# Patient Record
Sex: Female | Born: 1940
Health system: Southern US, Community
[De-identification: ages and names within clinical notes are randomized; demographics above are authoritative.]

## PROBLEM LIST (undated history)

## (undated) DIAGNOSIS — N2581 Secondary hyperparathyroidism of renal origin: Secondary | ICD-10-CM

## (undated) DIAGNOSIS — IMO0002 Reserved for concepts with insufficient information to code with codable children: Secondary | ICD-10-CM

## (undated) DIAGNOSIS — I2510 Atherosclerotic heart disease of native coronary artery without angina pectoris: Secondary | ICD-10-CM

## (undated) DIAGNOSIS — E1140 Type 2 diabetes mellitus with diabetic neuropathy, unspecified: Secondary | ICD-10-CM

## (undated) DIAGNOSIS — G4730 Sleep apnea, unspecified: Secondary | ICD-10-CM

## (undated) DIAGNOSIS — M8580 Other specified disorders of bone density and structure, unspecified site: Secondary | ICD-10-CM

## (undated) DIAGNOSIS — K635 Polyp of colon: Secondary | ICD-10-CM

## (undated) DIAGNOSIS — E079 Disorder of thyroid, unspecified: Secondary | ICD-10-CM

## (undated) DIAGNOSIS — J189 Pneumonia, unspecified organism: Secondary | ICD-10-CM

## (undated) DIAGNOSIS — N183 Chronic kidney disease, stage 3 unspecified: Secondary | ICD-10-CM

## (undated) DIAGNOSIS — E1165 Type 2 diabetes mellitus with hyperglycemia: Secondary | ICD-10-CM

## (undated) DIAGNOSIS — G44009 Cluster headache syndrome, unspecified, not intractable: Secondary | ICD-10-CM

## (undated) DIAGNOSIS — E785 Hyperlipidemia, unspecified: Secondary | ICD-10-CM

## (undated) DIAGNOSIS — M1990 Unspecified osteoarthritis, unspecified site: Secondary | ICD-10-CM

## (undated) DIAGNOSIS — T4145XA Adverse effect of unspecified anesthetic, initial encounter: Secondary | ICD-10-CM

## (undated) DIAGNOSIS — J45909 Unspecified asthma, uncomplicated: Secondary | ICD-10-CM

## (undated) DIAGNOSIS — H903 Sensorineural hearing loss, bilateral: Secondary | ICD-10-CM

## (undated) DIAGNOSIS — T8859XA Other complications of anesthesia, initial encounter: Secondary | ICD-10-CM

## (undated) DIAGNOSIS — C50912 Malignant neoplasm of unspecified site of left female breast: Secondary | ICD-10-CM

## (undated) DIAGNOSIS — K219 Gastro-esophageal reflux disease without esophagitis: Secondary | ICD-10-CM

## (undated) DIAGNOSIS — I10 Essential (primary) hypertension: Secondary | ICD-10-CM

## (undated) DIAGNOSIS — E039 Hypothyroidism, unspecified: Secondary | ICD-10-CM

## (undated) HISTORY — DX: Other specified disorders of bone density and structure, unspecified site: M85.80

## (undated) HISTORY — DX: Essential (primary) hypertension: I10

## (undated) HISTORY — DX: Chronic kidney disease, stage 3 unspecified: N18.30

## (undated) HISTORY — DX: Disorder of thyroid, unspecified: E07.9

## (undated) HISTORY — DX: Sensorineural hearing loss, bilateral: H90.3

## (undated) HISTORY — DX: Unspecified osteoarthritis, unspecified site: M19.90

## (undated) HISTORY — DX: Atherosclerotic heart disease of native coronary artery without angina pectoris: I25.10

## (undated) HISTORY — PX: BREAST BIOPSY: SHX20

## (undated) HISTORY — DX: Hyperlipidemia, unspecified: E78.5

## (undated) HISTORY — DX: Sleep apnea, unspecified: G47.30

## (undated) HISTORY — DX: Polyp of colon: K63.5

## (undated) HISTORY — DX: Chronic kidney disease, stage 3 (moderate): N18.3

## (undated) HISTORY — DX: Cluster headache syndrome, unspecified, not intractable: G44.009

## (undated) HISTORY — DX: Type 2 diabetes mellitus with diabetic neuropathy, unspecified: E11.40

## (undated) HISTORY — PX: GALLBLADDER SURGERY: SHX652

## (undated) HISTORY — DX: Gastro-esophageal reflux disease without esophagitis: K21.9

## (undated) HISTORY — PX: ABDOMINAL HYSTERECTOMY: SHX81

## (undated) HISTORY — DX: Secondary hyperparathyroidism of renal origin: N25.81

## (undated) HISTORY — DX: Type 2 diabetes mellitus with hyperglycemia: E11.65

## (undated) HISTORY — PX: CHOLECYSTECTOMY: SHX55

## (undated) HISTORY — DX: Reserved for concepts with insufficient information to code with codable children: IMO0002

## (undated) HISTORY — DX: Unspecified asthma, uncomplicated: J45.909

## (undated) HISTORY — PX: APPENDECTOMY: SHX54

## (undated) HISTORY — DX: Malignant neoplasm of unspecified site of left female breast: C50.912

---

## 2003-02-13 LAB — HM PAP SMEAR

## 2004-06-30 HISTORY — PX: CARDIAC CATHETERIZATION: SHX172

## 2008-08-05 ENCOUNTER — Emergency Department: Payer: Self-pay | Admitting: Emergency Medicine

## 2009-02-12 LAB — HM COLONOSCOPY

## 2010-02-12 LAB — HM DEXA SCAN

## 2010-05-30 HISTORY — PX: COLONOSCOPY: SHX174

## 2011-07-03 DIAGNOSIS — D492 Neoplasm of unspecified behavior of bone, soft tissue, and skin: Secondary | ICD-10-CM | POA: Diagnosis not present

## 2011-09-12 DIAGNOSIS — R5381 Other malaise: Secondary | ICD-10-CM | POA: Diagnosis not present

## 2011-09-12 DIAGNOSIS — J020 Streptococcal pharyngitis: Secondary | ICD-10-CM | POA: Diagnosis not present

## 2011-09-12 DIAGNOSIS — R509 Fever, unspecified: Secondary | ICD-10-CM | POA: Diagnosis not present

## 2011-09-12 DIAGNOSIS — J029 Acute pharyngitis, unspecified: Secondary | ICD-10-CM | POA: Diagnosis not present

## 2011-09-12 DIAGNOSIS — R5383 Other fatigue: Secondary | ICD-10-CM | POA: Diagnosis not present

## 2011-09-19 DIAGNOSIS — M999 Biomechanical lesion, unspecified: Secondary | ICD-10-CM | POA: Diagnosis not present

## 2011-09-19 DIAGNOSIS — M546 Pain in thoracic spine: Secondary | ICD-10-CM | POA: Diagnosis not present

## 2011-10-06 DIAGNOSIS — M999 Biomechanical lesion, unspecified: Secondary | ICD-10-CM | POA: Diagnosis not present

## 2011-10-06 DIAGNOSIS — M546 Pain in thoracic spine: Secondary | ICD-10-CM | POA: Diagnosis not present

## 2012-01-14 ENCOUNTER — Ambulatory Visit: Payer: Self-pay | Admitting: Internal Medicine

## 2012-01-14 DIAGNOSIS — L821 Other seborrheic keratosis: Secondary | ICD-10-CM | POA: Diagnosis not present

## 2012-01-14 DIAGNOSIS — L738 Other specified follicular disorders: Secondary | ICD-10-CM | POA: Diagnosis not present

## 2012-01-14 DIAGNOSIS — D239 Other benign neoplasm of skin, unspecified: Secondary | ICD-10-CM | POA: Diagnosis not present

## 2012-02-13 ENCOUNTER — Encounter: Payer: Self-pay | Admitting: Internal Medicine

## 2012-02-13 ENCOUNTER — Ambulatory Visit (INDEPENDENT_AMBULATORY_CARE_PROVIDER_SITE_OTHER): Payer: Medicare Other | Admitting: Internal Medicine

## 2012-02-13 VITALS — BP 170/68 | HR 78 | Temp 98.0°F | Resp 16 | Ht 65.5 in | Wt 180.5 lb

## 2012-02-13 DIAGNOSIS — R5381 Other malaise: Secondary | ICD-10-CM

## 2012-02-13 DIAGNOSIS — G4733 Obstructive sleep apnea (adult) (pediatric): Secondary | ICD-10-CM

## 2012-02-13 DIAGNOSIS — E1142 Type 2 diabetes mellitus with diabetic polyneuropathy: Secondary | ICD-10-CM

## 2012-02-13 DIAGNOSIS — Z9989 Dependence on other enabling machines and devices: Secondary | ICD-10-CM

## 2012-02-13 DIAGNOSIS — E559 Vitamin D deficiency, unspecified: Secondary | ICD-10-CM

## 2012-02-13 DIAGNOSIS — E1149 Type 2 diabetes mellitus with other diabetic neurological complication: Secondary | ICD-10-CM | POA: Diagnosis not present

## 2012-02-13 DIAGNOSIS — R5383 Other fatigue: Secondary | ICD-10-CM

## 2012-02-13 DIAGNOSIS — E1140 Type 2 diabetes mellitus with diabetic neuropathy, unspecified: Secondary | ICD-10-CM

## 2012-02-13 DIAGNOSIS — I2510 Atherosclerotic heart disease of native coronary artery without angina pectoris: Secondary | ICD-10-CM | POA: Diagnosis not present
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
## 2012-02-13 NOTE — Patient Instructions (Addendum)
You need 1200 mg calcium daily either through diet or combination with supplements.    You need 800 units vitamin D.  We will check your level with next blood draw.   Please bring your log of old blood sugars next week to drop off.  Stop the cremora, and use half n half, it is better for you  We will get your mammogram set up in the next couple of weeks.    Consider a Low Glycemic Index Diet and eating 6 smaller meals daily .  This frequent feeding stimulates your metabolism and the lower glycemic index foods will lower your blood sugars:   This is an example of my daily  "Low GI"  Diet:  All of the foods can be found at grocery stores and in bulk at [employer]  club   7 AM Breakfast:  Low carbohydrate Protein  Shakes (I recommend the [employer]vant[employer] "Carb Control" shakes  Or the low carb shakes by Atkins.   Both are available everywhere:  In  cases at [employer]  Or in 4 packs at grocery stores and pharmacies  2.5 carbs  (Alternative is  a toasted Arnold's Sandwhich Thin w/ peanut butter, a "Bagel Thin" with cream cheese and salmon) or  a scrambled egg burrito made with a low carb tortilla .  Avoid cereal and bananas, oatmeal too unless the old fashioned kind that takes 30-40 minutes to prepare.  the rest is overly processed, has minimal fiber, and loaded with carbohydrates!   10 AM: Protein bar by Atkins (the snack size, under 200 cal.  There are many varieties , available widely again or in bulk in limited varieties at [employer])  Other so called "protein bars" tend to be loaded with carbohydrates.  Remember, in food advertising, the word "energy" is synonymous for " carbohydrate."  Lunch: sandwich of [place], (or any lunchmeat or canned tuna), fresh avocado and cheese on a lower carbohydrate pita bread, flatbread, or tortilla . Ok to use mayonnaise. The bread is the only source or carbohydrate that can be decreased (Joseph's makes a pita bread and a flat bread  Are 50 cal and 4 net carbs ; Toufayan makes a low  carb flatbread 100 cal and 9 net carbs  and  Mission makes a low carb whole wheat tortilla  210 cal and 6 net carbs)  3 PM:  Mid day :  Another proteintttt bThe brear,  Or a  cheese stick (100 cal, 0 carbs),  Or 1 ounce of  almonds, walnuts, pistachios, pecans, peanuts,  Macadamia nuts. Or a Dannon light n Fit greek yogurt, 80 cal 8 net carbs . Avoid "granola"; the dried cranberries and raisins are loaded with carbohydrates.    6 PM  Dinner:  "mean and green:"  Meat/chicken/fish or a high protein legume; , with a green salad, and a low GI  Veggie (broccoli, cauliflower, green beans, spinach, brussel sprouts. Lima beans) : Avoid "Low fat dressings, Reyne Dumas and [address]! They are loaded with sugar! Instead use ranch, vinagrette,  Blue cheese, etc  9 PM snack : Breyer's "low carb" fudgsicle or  ice cream bar (Carb Smart line), or  Weight Watcher's ice cream bar , or anouther "no sugar added" ice cream; or another protein shake or a serving of fresh fruit with whipped cream (Avoid bananas, pineapple, grapes  and watermelon on a regular basis because they are high in sugar)   Remember that snack Substitutions should be less than 15 to 20 carbs  Per serving. Remember to subtract fiber grams to get the "net carbs."

## 2012-02-15 DIAGNOSIS — E1140 Type 2 diabetes mellitus with diabetic neuropathy, unspecified: Secondary | ICD-10-CM | POA: Insufficient documentation

## 2012-02-15 DIAGNOSIS — E118 Type 2 diabetes mellitus with unspecified complications: Secondary | ICD-10-CM | POA: Insufficient documentation

## 2012-02-15 NOTE — Assessment & Plan Note (Addendum)
We spent 15 minutes reviewing the role of diet in management of diabetes. We discussed the low glycemic index diet. I explained to her and husband had to count carbohydrates and give her the [facility] glycemic index list of foods. I've also given her a copy of the low glycemic index diet and suggested that she try to follow this closely as possible. I warned her that doing this may require reduction in her insulin dosing. She's also taking her insulin correctly. Her is 60 7030 insulin is to be taken before dinner or before lunch. I'm not sure why she is on 3 different injectable medications; is an odd regimen. She will return in one month to discuss blood sugars again. Hemoglobin A1c has been ordered.

## 2012-02-15 NOTE — Assessment & Plan Note (Signed)
She is in need of supplies for her current keep CPAP machine and is not using it because it is leaking. Her last sleep study was over 4 years ago I scheduled her for a new sleep study so that she may at some new equipment as well.

## 2012-02-15 NOTE — Assessment & Plan Note (Addendum)
With 2 prior cardiac catheterizations done because of chest pain.  Nonocclusive disease,no history of stents.  First cardiac cath by Deberah Castle in [place], 2nd one was a few years ago with no changes reported.  Last saw him Dec 2012.  Records requested. Continue beta blocker.

## 2012-02-18 ENCOUNTER — Other Ambulatory Visit: Payer: Medicare Other

## 2012-02-19 ENCOUNTER — Other Ambulatory Visit: Payer: Medicare Other

## 2012-02-20 ENCOUNTER — Other Ambulatory Visit: Payer: Medicare Other

## 2012-02-25 ENCOUNTER — Other Ambulatory Visit (INDEPENDENT_AMBULATORY_CARE_PROVIDER_SITE_OTHER): Payer: Medicare Other | Admitting: *Deleted

## 2012-02-25 DIAGNOSIS — E1140 Type 2 diabetes mellitus with diabetic neuropathy, unspecified: Secondary | ICD-10-CM

## 2012-02-25 DIAGNOSIS — E559 Vitamin D deficiency, unspecified: Secondary | ICD-10-CM

## 2012-02-25 DIAGNOSIS — R5381 Other malaise: Secondary | ICD-10-CM | POA: Diagnosis not present

## 2012-02-25 DIAGNOSIS — R5383 Other fatigue: Secondary | ICD-10-CM

## 2012-02-25 DIAGNOSIS — E1149 Type 2 diabetes mellitus with other diabetic neurological complication: Secondary | ICD-10-CM

## 2012-02-25 DIAGNOSIS — E1142 Type 2 diabetes mellitus with diabetic polyneuropathy: Secondary | ICD-10-CM

## 2012-02-25 LAB — COMPREHENSIVE METABOLIC PANEL
ALT: 17 U/L (ref 0–35)
AST: 18 U/L (ref 0–37)
BUN: 10 mg/dL (ref 6–23)
Creatinine, Ser: 1.1 mg/dL (ref 0.4–1.2)
GFR: 54.63 mL/min — ABNORMAL LOW (ref >60.00)
Total Bilirubin: 0.9 mg/dL (ref 0.3–1.2)

## 2012-02-25 LAB — LIPID PANEL
HDL: 39.7 mg/dL (ref >39.00)
Total CHOL/HDL Ratio: 4
Triglycerides: 187 mg/dL — ABNORMAL HIGH (ref 0.0–149.0)
VLDL: 37.4 mg/dL (ref 0.0–40.0)

## 2012-02-25 LAB — MICROALBUMIN / CREATININE URINE RATIO: Microalb, Ur: 3.7 mg/dL — ABNORMAL HIGH (ref 0.0–1.9)

## 2012-02-25 LAB — TSH: TSH: 4.01 u[IU]/mL (ref 0.35–5.50)

## 2012-02-26 ENCOUNTER — Telehealth: Payer: Self-pay | Admitting: Internal Medicine

## 2012-02-26 LAB — VITAMIN D 25 HYDROXY (VIT D DEFICIENCY, FRACTURES): Vit D, 25-Hydroxy: 28 ng/mL — ABNORMAL LOW (ref 30–89)

## 2012-02-26 NOTE — Telephone Encounter (Signed)
I have reviewed her labs. Unfortunately the hemoglobin A1c was not ordered or resulted. If they can add this to the labs that were done,  [place];  if not I would like her to come back at her convenience to have it drawn . Judging by the blood sugars that she dropped off her hemoglobin A1c is probably around 11. I'm recommending that she stop the Lantus and start taking 2 shots of  70/30 insulin  daily. One will be before breakfast and  the other before dinner.. I would like her to increase her evening dose of 70/30 from 1030 units aunits currently to start a morning dose of 20 units before breakfast. I would like her to resubmit blood sugars in 2 weeks. If she is having any lows as a result of this change she should call immediately for further instructions.  Also her vitamin D level is a little bit low. If she's not taking any supplements currently for vitamin D she should start taking 1000 units daily.  this can be obtained over-the-counter.

## 2012-02-27 NOTE — Telephone Encounter (Signed)
Patient notified. She will call back next week to schedule lab visit for a1c.    ( Spoke with Dr. Darrick Huntsman, I was unclear of insulin dose- she is to take 30 units in PM and 20 units in am.)

## 2012-03-09 ENCOUNTER — Other Ambulatory Visit (INDEPENDENT_AMBULATORY_CARE_PROVIDER_SITE_OTHER): Payer: Medicare Other

## 2012-03-09 DIAGNOSIS — E119 Type 2 diabetes mellitus without complications: Secondary | ICD-10-CM | POA: Diagnosis not present

## 2012-03-10 ENCOUNTER — Telehealth: Payer: Self-pay | Admitting: Internal Medicine

## 2012-03-11 ENCOUNTER — Other Ambulatory Visit: Payer: Self-pay | Admitting: Internal Medicine

## 2012-03-11 MED ORDER — INSULIN ASPART PROT & ASPART (70-30 MIX) 100 UNIT/ML ~~LOC~~ SUSP: 10.0000 [IU] | Freq: Every day | SUBCUTANEOUS | Status: DC

## 2012-03-11 MED ORDER — FUROSEMIDE 20 MG PO TABS: 10.0000 mg | ORAL_TABLET | Freq: Every day | ORAL | Status: DC

## 2012-03-11 MED ORDER — ATORVASTATIN CALCIUM 40 MG PO TABS: 40.0000 mg | ORAL_TABLET | Freq: Every day | ORAL | Status: DC

## 2012-03-11 MED ORDER — POTASSIUM CHLORIDE CRYS ER 20 MEQ PO TBCR: 20.0000 meq | EXTENDED_RELEASE_TABLET | Freq: Every day | ORAL | Status: DC

## 2012-03-11 MED ORDER — LIRAGLUTIDE 18 MG/3ML ~~LOC~~ SOLN: 30.0000 mg | Freq: Every day | SUBCUTANEOUS | Status: DC

## 2012-03-11 MED ORDER — METOPROLOL SUCCINATE ER 100 MG PO TB24: 100.0000 mg | ORAL_TABLET | Freq: Every day | ORAL | Status: DC

## 2012-03-11 MED ORDER — INSULIN GLARGINE 100 UNIT/ML ~~LOC~~ SOLN: 50.0000 [IU] | Freq: Every day | SUBCUTANEOUS | Status: DC

## 2012-03-11 MED ORDER — AMLODIPINE BESYLATE 5 MG PO TABS: 5.0000 mg | ORAL_TABLET | Freq: Every day | ORAL | Status: DC

## 2012-06-02 DIAGNOSIS — M9981 Other biomechanical lesions of cervical region: Secondary | ICD-10-CM | POA: Diagnosis not present

## 2012-06-02 DIAGNOSIS — M5137 Other intervertebral disc degeneration, lumbosacral region: Secondary | ICD-10-CM | POA: Diagnosis not present

## 2012-06-02 DIAGNOSIS — IMO0002 Reserved for concepts with insufficient information to code with codable children: Secondary | ICD-10-CM | POA: Diagnosis not present

## 2012-06-02 DIAGNOSIS — M5030 Other cervical disc degeneration, unspecified cervical region: Secondary | ICD-10-CM | POA: Diagnosis not present

## 2012-06-03 DIAGNOSIS — M5030 Other cervical disc degeneration, unspecified cervical region: Secondary | ICD-10-CM | POA: Diagnosis not present

## 2012-06-03 DIAGNOSIS — M5137 Other intervertebral disc degeneration, lumbosacral region: Secondary | ICD-10-CM | POA: Diagnosis not present

## 2012-06-03 DIAGNOSIS — M9981 Other biomechanical lesions of cervical region: Secondary | ICD-10-CM | POA: Diagnosis not present

## 2012-06-03 DIAGNOSIS — IMO0002 Reserved for concepts with insufficient information to code with codable children: Secondary | ICD-10-CM | POA: Diagnosis not present

## 2012-06-08 DIAGNOSIS — E119 Type 2 diabetes mellitus without complications: Secondary | ICD-10-CM | POA: Diagnosis not present

## 2012-06-08 DIAGNOSIS — M5030 Other cervical disc degeneration, unspecified cervical region: Secondary | ICD-10-CM | POA: Diagnosis not present

## 2012-06-08 DIAGNOSIS — M9981 Other biomechanical lesions of cervical region: Secondary | ICD-10-CM | POA: Diagnosis not present

## 2012-06-08 DIAGNOSIS — IMO0002 Reserved for concepts with insufficient information to code with codable children: Secondary | ICD-10-CM | POA: Diagnosis not present

## 2012-06-08 DIAGNOSIS — M5137 Other intervertebral disc degeneration, lumbosacral region: Secondary | ICD-10-CM | POA: Diagnosis not present

## 2012-06-10 DIAGNOSIS — IMO0002 Reserved for concepts with insufficient information to code with codable children: Secondary | ICD-10-CM | POA: Diagnosis not present

## 2012-06-10 DIAGNOSIS — M5030 Other cervical disc degeneration, unspecified cervical region: Secondary | ICD-10-CM | POA: Diagnosis not present

## 2012-06-10 DIAGNOSIS — M5137 Other intervertebral disc degeneration, lumbosacral region: Secondary | ICD-10-CM | POA: Diagnosis not present

## 2012-06-10 DIAGNOSIS — M9981 Other biomechanical lesions of cervical region: Secondary | ICD-10-CM | POA: Diagnosis not present

## 2012-06-14 DIAGNOSIS — M9981 Other biomechanical lesions of cervical region: Secondary | ICD-10-CM | POA: Diagnosis not present

## 2012-06-14 DIAGNOSIS — M5137 Other intervertebral disc degeneration, lumbosacral region: Secondary | ICD-10-CM | POA: Diagnosis not present

## 2012-06-14 DIAGNOSIS — M5030 Other cervical disc degeneration, unspecified cervical region: Secondary | ICD-10-CM | POA: Diagnosis not present

## 2012-06-14 DIAGNOSIS — IMO0002 Reserved for concepts with insufficient information to code with codable children: Secondary | ICD-10-CM | POA: Diagnosis not present

## 2012-06-15 ENCOUNTER — Ambulatory Visit (INDEPENDENT_AMBULATORY_CARE_PROVIDER_SITE_OTHER): Payer: Medicare Other | Admitting: Endocrinology

## 2012-06-15 ENCOUNTER — Encounter: Payer: Self-pay | Admitting: Endocrinology

## 2012-06-15 VITALS — BP 128/78 | HR 76 | Temp 97.8°F | Wt 184.0 lb

## 2012-06-15 DIAGNOSIS — E1165 Type 2 diabetes mellitus with hyperglycemia: Secondary | ICD-10-CM

## 2012-06-15 DIAGNOSIS — IMO0001 Reserved for inherently not codable concepts without codable children: Secondary | ICD-10-CM | POA: Diagnosis not present

## 2012-06-15 DIAGNOSIS — IMO0002 Reserved for concepts with insufficient information to code with codable children: Secondary | ICD-10-CM

## 2012-06-15 MED ORDER — INSULIN ASPART 100 UNIT/ML ~~LOC~~ SOLN: SUBCUTANEOUS | Status: DC

## 2012-06-15 NOTE — Progress Notes (Signed)
Subjective:    Patient ID: Julia Banks, female    DOB: 25-Feb-1941, 71 y.o.   MRN: [id number]  HPI pt states 20 years h/o dm, complicated by CAD.  he has been on insulin x approx 12 years.  pt says her diet and exercise are good.  She says cbg's are 170-400. It is in general higher as the day goes on.   Pt states of moderate pain at both legs, worst in the context of resting in bed, but no assoc numbness. No past medical history on file.  No past surgical history on file.  History   Social History  . Marital Status: Married    Spouse Name: N/A    Number of Children: N/A  . Years of Education: N/A   Occupational History  . Not on file.   Social History Main Topics  . Smoking status: Never Smoker   . Smokeless tobacco: Never Used  . Alcohol Use: No  . Drug Use: No  . Sexually Active: Not on file   Other Topics Concern  . Not on file   Social History Narrative  . No narrative on file    Current Outpatient Prescriptions on File Prior to Visit  Medication Sig Dispense Refill  . amLODipine (NORVASC) 5 MG tablet Take 1 tablet (5 mg total) by mouth daily.  30 tablet  3  . atorvastatin (LIPITOR) 40 MG tablet Take 1 tablet (40 mg total) by mouth daily.  30 tablet  3  . furosemide (LASIX) 20 MG tablet Take 0.5 tablets (10 mg total) by mouth daily.  30 tablet  3  . insulin aspart protamine-insulin aspart (NOVOLOG 70/30) (70-30) 100 UNIT/ML injection Inject 10 Units into the skin daily with supper.  10 mL  3  . insulin glargine (LANTUS) 100 UNIT/ML injection Inject 50 Units into the skin at bedtime.  10 mL  3  . metoprolol succinate (TOPROL-XL) 100 MG 24 hr tablet Take 1 tablet (100 mg total) by mouth daily. Take with or immediately following a meal.  30 tablet  3  . potassium chloride SA (K-DUR,KLOR-CON) 20 MEQ tablet Take 1 tablet (20 mEq total) by mouth daily.  30 tablet  3  . Liraglutide (VICTOZA) 18 MG/3ML SOLN Inject 5 mLs (30 mg total) into the skin daily.  6 mL  3    Allergies  Allergen Reactions  . Altace (Ramipril)   . Aspirin    No family history on file. DM: mother and 1 sib BP 128/78  Pulse 76  Temp 97.8 F (36.6 C) (Oral)  Wt 184 lb (83.462 kg)  SpO2 95%  Review of Systems denies blurry vision, headache, chest pain, sob, cramps, excessive diaphoresis, memory loss, depression, hypoglycemia, rhinorrhea, and easy bruising.  She has lost 30 lbs, due to her efforts.  She reports urinary frequency and easy bruising.  She says victoza causes nausea.    Objective:   Physical Exam VS: see vs page GEN: no distress HEAD: head: no deformity eyes: no periorbital swelling, no proptosis external nose and ears are normal mouth: no lesion seen NECK: supple, thyroid is not enlarged CHEST WALL: no deformity LUNGS:  Clear to auscultation CV: reg rate and rhythm, no murmur ABD: abdomen is soft, nontender.  no hepatosplenomegaly.  not distended.  no hernia MUSCULOSKELETAL: muscle bulk and strength are grossly normal.  no obvious joint swelling.  gait is normal and steady EXTEMITIES: no deformity.  no ulcer on the feet.  feet are of normal color and  temp.  no edema PULSES: dorsalis pedis intact bilat.  no carotid bruit NEURO:  cn 2-12 grossly intact.   readily moves all 4's.  sensation is intact to touch on the feet SKIN:  Normal texture and temperature.  No rash or suspicious lesion is visible.   NODES:  None palpable at the neck PSYCH: alert, oriented x3.  Does not appear anxious nor depressed.  Lab Results  Component Value Date   HGBA1C 11.7* 03/09/2012      Assessment & Plan:  DM, needs increased rx.  i agree with dr Darrick Huntsman this this causes high risk to her health Leg pain, ? Neuropathic Weight loss, prob due to severe hyperglycemia Nausea due to victoza

## 2012-06-15 NOTE — Patient Instructions (Addendum)
good diet and exercise habits significanly improve the control of your diabetes.  please let me know if you wish to be referred to a dietician.  high blood sugar is very risky to your health.  you should see an eye doctor every year.  You are at higher than average risk for pneumonia and hepatitis-B.  You should be vaccinated against both.   controlling your blood pressure and cholesterol drastically reduces the damage diabetes does to your body.  this also applies to quitting smoking.  please discuss these with your doctor.  you should take an aspirin every day, unless you have been advised by a doctor not to. check your blood sugar twice a day.  vary the time of day when you check, between before the 3 meals, and at bedtime.  also check if you have symptoms of your blood sugar being too high or too low.  please keep a record of the readings and bring it to your next appointment here.  please call us sooner if your blood sugar goes below 70, or if you have a lot of readings over 200.   For now: Please continue the same lantus, and: Increase the novolog to 3 times a day (just before each meal) 04-09-19 units. Please come back for a follow-up appointment in 3-4 weeks.   You can stop taking the victoza.

## 2012-06-16 DIAGNOSIS — IMO0002 Reserved for concepts with insufficient information to code with codable children: Secondary | ICD-10-CM | POA: Diagnosis not present

## 2012-06-16 DIAGNOSIS — M5030 Other cervical disc degeneration, unspecified cervical region: Secondary | ICD-10-CM | POA: Diagnosis not present

## 2012-06-16 DIAGNOSIS — M9981 Other biomechanical lesions of cervical region: Secondary | ICD-10-CM | POA: Diagnosis not present

## 2012-06-16 DIAGNOSIS — M5137 Other intervertebral disc degeneration, lumbosacral region: Secondary | ICD-10-CM | POA: Diagnosis not present

## 2012-06-18 LAB — HM DIABETES EYE EXAM: HM Diabetic Eye Exam: NORMAL

## 2012-06-21 DIAGNOSIS — L851 Acquired keratosis [keratoderma] palmaris et plantaris: Secondary | ICD-10-CM | POA: Diagnosis not present

## 2012-06-21 DIAGNOSIS — E1149 Type 2 diabetes mellitus with other diabetic neurological complication: Secondary | ICD-10-CM | POA: Diagnosis not present

## 2012-06-21 DIAGNOSIS — B351 Tinea unguium: Secondary | ICD-10-CM | POA: Diagnosis not present

## 2012-06-21 DIAGNOSIS — E1142 Type 2 diabetes mellitus with diabetic polyneuropathy: Secondary | ICD-10-CM | POA: Diagnosis not present

## 2012-06-25 ENCOUNTER — Other Ambulatory Visit: Payer: Self-pay | Admitting: Internal Medicine

## 2012-06-27 ENCOUNTER — Ambulatory Visit: Payer: Self-pay | Admitting: Internal Medicine

## 2012-06-27 DIAGNOSIS — G2589 Other specified extrapyramidal and movement disorders: Secondary | ICD-10-CM | POA: Diagnosis not present

## 2012-06-27 DIAGNOSIS — G2581 Restless legs syndrome: Secondary | ICD-10-CM | POA: Diagnosis not present

## 2012-06-27 DIAGNOSIS — R0609 Other forms of dyspnea: Secondary | ICD-10-CM | POA: Diagnosis not present

## 2012-06-27 DIAGNOSIS — R0989 Other specified symptoms and signs involving the circulatory and respiratory systems: Secondary | ICD-10-CM | POA: Diagnosis not present

## 2012-06-27 DIAGNOSIS — G4733 Obstructive sleep apnea (adult) (pediatric): Secondary | ICD-10-CM | POA: Diagnosis not present

## 2012-06-29 ENCOUNTER — Other Ambulatory Visit: Payer: Self-pay | Admitting: Internal Medicine

## 2012-06-29 DIAGNOSIS — IMO0002 Reserved for concepts with insufficient information to code with codable children: Secondary | ICD-10-CM | POA: Diagnosis not present

## 2012-06-29 DIAGNOSIS — M5137 Other intervertebral disc degeneration, lumbosacral region: Secondary | ICD-10-CM | POA: Diagnosis not present

## 2012-06-29 DIAGNOSIS — M5030 Other cervical disc degeneration, unspecified cervical region: Secondary | ICD-10-CM | POA: Diagnosis not present

## 2012-06-29 DIAGNOSIS — M9981 Other biomechanical lesions of cervical region: Secondary | ICD-10-CM | POA: Diagnosis not present

## 2012-07-01 ENCOUNTER — Telehealth: Payer: Self-pay | Admitting: Internal Medicine

## 2012-07-01 ENCOUNTER — Other Ambulatory Visit: Payer: Self-pay | Admitting: *Deleted

## 2012-07-01 DIAGNOSIS — M5030 Other cervical disc degeneration, unspecified cervical region: Secondary | ICD-10-CM | POA: Diagnosis not present

## 2012-07-01 DIAGNOSIS — IMO0002 Reserved for concepts with insufficient information to code with codable children: Secondary | ICD-10-CM | POA: Diagnosis not present

## 2012-07-01 DIAGNOSIS — M5137 Other intervertebral disc degeneration, lumbosacral region: Secondary | ICD-10-CM | POA: Diagnosis not present

## 2012-07-01 DIAGNOSIS — Z9989 Dependence on other enabling machines and devices: Secondary | ICD-10-CM

## 2012-07-01 DIAGNOSIS — M9981 Other biomechanical lesions of cervical region: Secondary | ICD-10-CM | POA: Diagnosis not present

## 2012-07-01 MED ORDER — ZOLPIDEM TARTRATE 5 MG PO TABS: 5.0000 mg | ORAL_TABLET | Freq: Every evening | ORAL | Status: DC | PRN

## 2012-07-01 MED ORDER — ATORVASTATIN CALCIUM 40 MG PO TABS: 40.0000 mg | ORAL_TABLET | Freq: Every day | ORAL | Status: DC

## 2012-07-01 MED ORDER — INSULIN GLARGINE 100 UNIT/ML ~~LOC~~ SOLN: 50.0000 [IU] | Freq: Every day | SUBCUTANEOUS | Status: DC

## 2012-07-01 MED ORDER — METOPROLOL SUCCINATE ER 100 MG PO TB24: 100.0000 mg | ORAL_TABLET | Freq: Every day | ORAL | Status: DC

## 2012-07-01 MED ORDER — POTASSIUM CHLORIDE CRYS ER 20 MEQ PO TBCR: 20.0000 meq | EXTENDED_RELEASE_TABLET | Freq: Every day | ORAL | Status: DC

## 2012-07-01 NOTE — Assessment & Plan Note (Signed)
Confirm with repeat study but inadequate volume of sleep was to titrate CPAP. Reschedule in with a zolpidem.

## 2012-07-01 NOTE — Telephone Encounter (Signed)
Filled script for Lipitor 40 mg, Lantus, metoprolol succinate 100 mg and klor-con

## 2012-07-01 NOTE — Telephone Encounter (Signed)
Sleep study did show moderate obstructive sleep apnea with oxygen l desaturations noted but she did not sleep well enough to have the titration study done the same time. We'll reschedule her for a CPAP sleep study with titration and she will need something to help her rest so please call  in gemeric Ambien 5 mg tablets  #10  Directions take one tablet one  hour prior to study sleep. May repeat dose in 30 minutes if not sleepy. Chart updated.

## 2012-07-06 DIAGNOSIS — M9981 Other biomechanical lesions of cervical region: Secondary | ICD-10-CM | POA: Diagnosis not present

## 2012-07-06 DIAGNOSIS — IMO0002 Reserved for concepts with insufficient information to code with codable children: Secondary | ICD-10-CM | POA: Diagnosis not present

## 2012-07-06 DIAGNOSIS — M5030 Other cervical disc degeneration, unspecified cervical region: Secondary | ICD-10-CM | POA: Diagnosis not present

## 2012-07-06 DIAGNOSIS — M5137 Other intervertebral disc degeneration, lumbosacral region: Secondary | ICD-10-CM | POA: Diagnosis not present

## 2012-07-06 NOTE — Telephone Encounter (Signed)
LMOVM for pt to return call 

## 2012-07-07 ENCOUNTER — Ambulatory Visit: Payer: Self-pay | Admitting: Internal Medicine

## 2012-07-07 DIAGNOSIS — G4733 Obstructive sleep apnea (adult) (pediatric): Secondary | ICD-10-CM | POA: Diagnosis not present

## 2012-07-07 DIAGNOSIS — G2589 Other specified extrapyramidal and movement disorders: Secondary | ICD-10-CM | POA: Diagnosis not present

## 2012-07-07 DIAGNOSIS — G2581 Restless legs syndrome: Secondary | ICD-10-CM | POA: Diagnosis not present

## 2012-07-07 DIAGNOSIS — R0989 Other specified symptoms and signs involving the circulatory and respiratory systems: Secondary | ICD-10-CM | POA: Diagnosis not present

## 2012-07-07 DIAGNOSIS — R0902 Hypoxemia: Secondary | ICD-10-CM | POA: Diagnosis not present

## 2012-07-07 DIAGNOSIS — G4736 Sleep related hypoventilation in conditions classified elsewhere: Secondary | ICD-10-CM | POA: Diagnosis not present

## 2012-07-07 DIAGNOSIS — R0609 Other forms of dyspnea: Secondary | ICD-10-CM | POA: Diagnosis not present

## 2012-07-08 DIAGNOSIS — IMO0002 Reserved for concepts with insufficient information to code with codable children: Secondary | ICD-10-CM | POA: Diagnosis not present

## 2012-07-08 DIAGNOSIS — M5030 Other cervical disc degeneration, unspecified cervical region: Secondary | ICD-10-CM | POA: Diagnosis not present

## 2012-07-08 DIAGNOSIS — M9981 Other biomechanical lesions of cervical region: Secondary | ICD-10-CM | POA: Diagnosis not present

## 2012-07-08 DIAGNOSIS — M5137 Other intervertebral disc degeneration, lumbosacral region: Secondary | ICD-10-CM | POA: Diagnosis not present

## 2012-07-12 DIAGNOSIS — M9981 Other biomechanical lesions of cervical region: Secondary | ICD-10-CM | POA: Diagnosis not present

## 2012-07-12 DIAGNOSIS — M5137 Other intervertebral disc degeneration, lumbosacral region: Secondary | ICD-10-CM | POA: Diagnosis not present

## 2012-07-12 DIAGNOSIS — IMO0002 Reserved for concepts with insufficient information to code with codable children: Secondary | ICD-10-CM | POA: Diagnosis not present

## 2012-07-12 DIAGNOSIS — M5030 Other cervical disc degeneration, unspecified cervical region: Secondary | ICD-10-CM | POA: Diagnosis not present

## 2012-07-13 ENCOUNTER — Telehealth: Payer: Self-pay | Admitting: Internal Medicine

## 2012-07-13 DIAGNOSIS — G4733 Obstructive sleep apnea (adult) (pediatric): Secondary | ICD-10-CM

## 2012-07-13 DIAGNOSIS — Z9989 Dependence on other enabling machines and devices: Secondary | ICD-10-CM

## 2012-07-13 NOTE — Telephone Encounter (Signed)
Pt notified CPAP was ordered.

## 2012-07-13 NOTE — Telephone Encounter (Signed)
CPAP titration study results. Received, and CPAP has been ordered

## 2012-07-13 NOTE — Telephone Encounter (Signed)
Pt.notified

## 2012-07-14 DIAGNOSIS — M9981 Other biomechanical lesions of cervical region: Secondary | ICD-10-CM | POA: Diagnosis not present

## 2012-07-14 DIAGNOSIS — M5137 Other intervertebral disc degeneration, lumbosacral region: Secondary | ICD-10-CM | POA: Diagnosis not present

## 2012-07-14 DIAGNOSIS — IMO0002 Reserved for concepts with insufficient information to code with codable children: Secondary | ICD-10-CM | POA: Diagnosis not present

## 2012-07-14 DIAGNOSIS — M5030 Other cervical disc degeneration, unspecified cervical region: Secondary | ICD-10-CM | POA: Diagnosis not present

## 2012-07-19 DIAGNOSIS — M9981 Other biomechanical lesions of cervical region: Secondary | ICD-10-CM | POA: Diagnosis not present

## 2012-07-19 DIAGNOSIS — M5137 Other intervertebral disc degeneration, lumbosacral region: Secondary | ICD-10-CM | POA: Diagnosis not present

## 2012-07-19 DIAGNOSIS — M5030 Other cervical disc degeneration, unspecified cervical region: Secondary | ICD-10-CM | POA: Diagnosis not present

## 2012-07-19 DIAGNOSIS — IMO0002 Reserved for concepts with insufficient information to code with codable children: Secondary | ICD-10-CM | POA: Diagnosis not present

## 2012-07-20 ENCOUNTER — Encounter: Payer: Self-pay | Admitting: Endocrinology

## 2012-07-20 ENCOUNTER — Ambulatory Visit (INDEPENDENT_AMBULATORY_CARE_PROVIDER_SITE_OTHER): Payer: Medicare Other | Admitting: Endocrinology

## 2012-07-20 VITALS — BP 130/70 | HR 75 | Temp 97.8°F | Wt 189.0 lb

## 2012-07-20 DIAGNOSIS — E1165 Type 2 diabetes mellitus with hyperglycemia: Secondary | ICD-10-CM

## 2012-07-20 DIAGNOSIS — IMO0001 Reserved for inherently not codable concepts without codable children: Secondary | ICD-10-CM | POA: Diagnosis not present

## 2012-07-20 DIAGNOSIS — IMO0002 Reserved for concepts with insufficient information to code with codable children: Secondary | ICD-10-CM

## 2012-07-20 MED ORDER — INSULIN ASPART 100 UNIT/ML ~~LOC~~ SOLN: SUBCUTANEOUS | Status: DC

## 2012-07-20 NOTE — Patient Instructions (Addendum)
check your blood sugar twice a day.  vary the time of day when you check, between before the 3 meals, and at bedtime.  also check if you have symptoms of your blood sugar being too high or too low.  please keep a record of the readings and bring it to your next appointment here.  please call us sooner if your blood sugar goes below 70, or if you have a lot of readings over 200.   Increase the novolog to 3 times a day (just before each meal) 20-20-30 units. Please reduce the lantus to 40 units at bedtime.   Please come back for a follow-up appointment in 1 month.

## 2012-07-20 NOTE — Progress Notes (Signed)
  Subjective:    Patient ID: Julia Banks, female    DOB: [birth year], 72 y.o.   MRN: [id number]  HPI she brings a record of her cbg's which i have reviewed today.  It varies from 111-300's.  It is in general higher as the day goes on.  pt states she feels well in general. Past Medical History  Diagnosis Date  . Asthma   . Arthritis   . Headaches, cluster   . Diabetes mellitus without complication   . Colon polyps   . UTI (lower urinary tract infection)     Past Surgical History  Procedure Date  . Gallbladder surgery   . Breast surgery   . Appendectomy   . Abdominal hysterectomy     History   Social History  . Marital Status: Married    Spouse Name: N/A    Number of Children: N/A  . Years of Education: N/A   Occupational History  . Not on file.   Social History Main Topics  . Smoking status: Never Smoker   . Smokeless tobacco: Never Used  . Alcohol Use: No  . Drug Use: No  . Sexually Active: Not on file   Other Topics Concern  . Not on file   Social History Narrative  . No narrative on file    Current Outpatient Prescriptions on File Prior to Visit  Medication Sig Dispense Refill  . amLODipine (NORVASC) 5 MG tablet Take 1 tablet (5 mg total) by mouth daily.  30 tablet  3  . atorvastatin (LIPITOR) 40 MG tablet Take 1 tablet (40 mg total) by mouth daily.  30 tablet  3  . furosemide (LASIX) 20 MG tablet Take 0.5 tablets (10 mg total) by mouth daily.  30 tablet  3  . insulin glargine (LANTUS) 100 UNIT/ML injection Inject 40 Units into the skin at bedtime.      . metoprolol succinate (TOPROL-XL) 100 MG 24 hr tablet Take 1 tablet (100 mg total) by mouth daily. Take with or immediately following a meal.  30 tablet  3  . potassium chloride SA (K-DUR,KLOR-CON) 20 MEQ tablet Take 1 tablet (20 mEq total) by mouth daily.  30 tablet  3  . zolpidem (AMBIEN) 5 MG tablet Take 1 tablet (5 mg total) by mouth at bedtime as needed for sleep.  10 tablet  0    Allergies    Allergen Reactions  . Altace (Ramipril)   . Aspirin     Family History  Problem Relation Age of Onset  . Arthritis Mother   . Hyperlipidemia Mother   . Stroke Mother   . Hypertension Mother   . Diabetes Mother   . Arthritis Father   . Hyperlipidemia Father   . Stroke Father   . Hypertension Father   . Diabetes Father     BP 130/70  Pulse 75  Temp 97.8 F (36.6 C) (Oral)  Wt 189 lb (85.73 kg)  SpO2 98%    Review of Systems denies hypoglycemia    Objective:   Physical Exam VITAL SIGNS:  See vs page GENERAL: no distress SKIN:  Insulin injection sites at the anterior abdomen are normal.       Assessment & Plan:  DM, Based on the pattern of her cbg's, she needs some adjustment in her therapy

## 2012-07-21 DIAGNOSIS — D239 Other benign neoplasm of skin, unspecified: Secondary | ICD-10-CM | POA: Diagnosis not present

## 2012-07-21 DIAGNOSIS — L821 Other seborrheic keratosis: Secondary | ICD-10-CM | POA: Diagnosis not present

## 2012-07-21 DIAGNOSIS — Z1283 Encounter for screening for malignant neoplasm of skin: Secondary | ICD-10-CM | POA: Diagnosis not present

## 2012-08-04 ENCOUNTER — Other Ambulatory Visit: Payer: Self-pay | Admitting: Internal Medicine

## 2012-08-04 NOTE — Telephone Encounter (Signed)
Med filled.  

## 2012-08-06 ENCOUNTER — Encounter: Payer: Self-pay | Admitting: Internal Medicine

## 2012-08-14 ENCOUNTER — Other Ambulatory Visit: Payer: Self-pay

## 2012-08-23 ENCOUNTER — Ambulatory Visit (INDEPENDENT_AMBULATORY_CARE_PROVIDER_SITE_OTHER): Payer: Medicare Other | Admitting: Endocrinology

## 2012-08-23 VITALS — BP 130/70 | HR 55 | Wt 193.0 lb

## 2012-08-23 DIAGNOSIS — IMO0002 Reserved for concepts with insufficient information to code with codable children: Secondary | ICD-10-CM

## 2012-08-23 DIAGNOSIS — IMO0001 Reserved for inherently not codable concepts without codable children: Secondary | ICD-10-CM

## 2012-08-23 DIAGNOSIS — E1165 Type 2 diabetes mellitus with hyperglycemia: Secondary | ICD-10-CM

## 2012-08-23 MED ORDER — INSULIN GLARGINE 100 UNIT/ML ~~LOC~~ SOLN: 20.0000 [IU] | Freq: Every day | SUBCUTANEOUS | Status: DC

## 2012-08-23 MED ORDER — GLUCOSE BLOOD VI STRP: 1.0000 | ORAL_STRIP | Freq: Two times a day (BID) | Status: DC

## 2012-08-23 MED ORDER — INSULIN ASPART 100 UNIT/ML ~~LOC~~ SOLN: SUBCUTANEOUS | Status: DC

## 2012-08-23 NOTE — Progress Notes (Signed)
  Subjective:    Patient ID: Julia Banks, female    DOB: 1940/07/11, 72 y.o.   MRN: [id number]  HPI Pt returns for f/u of insulin-requiring DM (dx'ed 1994; complicated by CAD).  she brings a record of her cbg's which i have reviewed today.  It varies from 105-300's.  It is in general higher as the day goes on.  pt states she feels well in general.   Past Medical History  Diagnosis Date  . Asthma   . Arthritis   . Headaches, cluster   . Diabetes mellitus without complication   . Colon polyps   . UTI (lower urinary tract infection)     Past Surgical History  Procedure Laterality Date  . Gallbladder surgery    . Breast surgery    . Appendectomy    . Abdominal hysterectomy      History   Social History  . Marital Status: Married    Spouse Name: N/A    Number of Children: N/A  . Years of Education: N/A   Occupational History  . Not on file.   Social History Main Topics  . Smoking status: Never Smoker   . Smokeless tobacco: Never Used  . Alcohol Use: No  . Drug Use: No  . Sexually Active: Not on file   Other Topics Concern  . Not on file   Social History Narrative  . No narrative on file    Current Outpatient Prescriptions on File Prior to Visit  Medication Sig Dispense Refill  . amLODipine (NORVASC) 5 MG tablet TAKE 1 TABLET (5 MG TOTAL) BY MOUTH DAILY.  30 tablet  3  . atorvastatin (LIPITOR) 40 MG tablet Take 1 tablet (40 mg total) by mouth daily.  30 tablet  3  . furosemide (LASIX) 20 MG tablet Take 0.5 tablets (10 mg total) by mouth daily.  30 tablet  3  . metoprolol succinate (TOPROL-XL) 100 MG 24 hr tablet Take 1 tablet (100 mg total) by mouth daily. Take with or immediately following a meal.  30 tablet  3  . potassium chloride SA (K-DUR,KLOR-CON) 20 MEQ tablet Take 1 tablet (20 mEq total) by mouth daily.  30 tablet  3  . zolpidem (AMBIEN) 5 MG tablet Take 1 tablet (5 mg total) by mouth at bedtime as needed for sleep.  10 tablet  0   No current  facility-administered medications on file prior to visit.    Allergies  Allergen Reactions  . Altace (Ramipril)   . Aspirin     Family History  Problem Relation Age of Onset  . Arthritis Mother   . Hyperlipidemia Mother   . Stroke Mother   . Hypertension Mother   . Diabetes Mother   . Arthritis Father   . Hyperlipidemia Father   . Stroke Father   . Hypertension Father   . Diabetes Father     BP 130/70  Pulse 55  Wt 193 lb (87.544 kg)  BMI 31.62 kg/m2  SpO2 97%  Review of Systems denies hypoglycemia    Objective:   Physical Exam VITAL SIGNS:  See vs page GENERAL: no distress PSYCH: Alert and oriented x 3.  Does not appear anxious nor depressed.     Assessment & Plan:  DM: Based on the pattern of her cbg's, she needs some adjustment in her therapy

## 2012-08-23 NOTE — Patient Instructions (Addendum)
check your blood sugar twice a day.  vary the time of day when you check, between before the 3 meals, and at bedtime.  also check if you have symptoms of your blood sugar being too high or too low.  please keep a record of the readings and bring it to your next appointment here.  please call us sooner if your blood sugar goes below 70, or if you have a lot of readings over 200.   Increase the novolog to 3 times a day (just before each meal) 40-40-50 units. Please reduce the lantus to 20 units at bedtime.   Please come back for a follow-up appointment in 1 month.

## 2012-09-16 ENCOUNTER — Encounter: Payer: Self-pay | Admitting: Internal Medicine

## 2012-09-16 ENCOUNTER — Ambulatory Visit (INDEPENDENT_AMBULATORY_CARE_PROVIDER_SITE_OTHER): Payer: Medicare Other | Admitting: Internal Medicine

## 2012-09-16 VITALS — BP 130/72 | HR 67 | Temp 97.8°F | Resp 16 | Wt 196.5 lb

## 2012-09-16 DIAGNOSIS — Z9989 Dependence on other enabling machines and devices: Secondary | ICD-10-CM

## 2012-09-16 DIAGNOSIS — E1165 Type 2 diabetes mellitus with hyperglycemia: Secondary | ICD-10-CM

## 2012-09-16 DIAGNOSIS — Z23 Encounter for immunization: Secondary | ICD-10-CM

## 2012-09-16 DIAGNOSIS — I10 Essential (primary) hypertension: Secondary | ICD-10-CM | POA: Diagnosis not present

## 2012-09-16 DIAGNOSIS — IMO0001 Reserved for inherently not codable concepts without codable children: Secondary | ICD-10-CM | POA: Diagnosis not present

## 2012-09-16 DIAGNOSIS — E785 Hyperlipidemia, unspecified: Secondary | ICD-10-CM | POA: Diagnosis not present

## 2012-09-16 DIAGNOSIS — G4733 Obstructive sleep apnea (adult) (pediatric): Secondary | ICD-10-CM

## 2012-09-16 DIAGNOSIS — IMO0002 Reserved for concepts with insufficient information to code with codable children: Secondary | ICD-10-CM

## 2012-09-16 LAB — HM DIABETES FOOT EXAM: HM Diabetic Foot Exam: NORMAL

## 2012-09-16 MED ORDER — TETANUS-DIPHTH-ACELL PERTUSSIS 5-2.5-18.5 LF-MCG/0.5 IM SUSP: 0.5000 mL | Freq: Once | INTRAMUSCULAR | Status: DC

## 2012-09-16 NOTE — Progress Notes (Addendum)
Patient ID: Julia Banks, female   DOB: [birth year], 72 y.o.   MRN: [id number]  Patient Active Problem List  Diagnosis  . OSA on CPAP  . Coronary artery disease  . Diabetes mellitus type II, uncontrolled    Subjective:  CC:   Chief Complaint  Patient presents with  . Follow-up    HPI:   Julia Banks is a 72 y.o. female who presents follow up on chronic conditions including diabetes mellitus, uncontrolled, hypertension, hyperlipidemia, obesity, and OSA .   Diabetes.  Initial a1c was 11.7 3 months ago.  Her sugars remain elevated to the high 200's despite change in insulin  regimen to 4 shots a day per recent endocrinology evaluation. She has not started following a low glycemic index diet and we reviewed her current typical food choices today. s.  Breakfast today was a thomas english muffin with rasins,  No jelly .  1/2 cup coffee with sugar.  Lunch yesterday was 1/2 half a reuben sandwhich.and cherry pie.    Dinner was a Ambulance person and home made grape juice Sweetened with sugar.   OSA:  Diagnosed  Several years ago with a sleep study. A repeat sleep study has been done in a new mask is ordered. She reports that she is sleeping much better since receiving her new mask. She has improved daytime weakness fullness decreased daytime sleepiness and is averaging 6-8 hours per night of CPAP use.     Past Medical History  Diagnosis Date  . Asthma   . Arthritis   . Headaches, cluster   . Diabetes mellitus without complication   . Colon polyps   . UTI (lower urinary tract infection)     Past Surgical History  Procedure Laterality Date  . Gallbladder surgery    . Breast surgery    . Appendectomy    . Abdominal hysterectomy         The following portions of the patient's history were reviewed and updated as appropriate: Allergies, current medications, and problem list.    Review of Systems:   Patient denies headache, fevers, malaise, unintentional weight loss, skin rash, eye  pain, sinus congestion and sinus pain, sore throat, dysphagia,  hemoptysis , cough, dyspnea, wheezing, chest pain, palpitations, orthopnea, edema, abdominal pain, nausea, melena, diarrhea, constipation, flank pain, dysuria, hematuria, urinary  Frequency, nocturia, numbness, tingling, seizures,  Focal weakness, Loss of consciousness,  Tremor, insomnia, depression, anxiety, and suicidal ideation.     History   Social History  . Marital Status: Married    Spouse Name: N/A    Number of Children: N/A  . Years of Education: N/A   Occupational History  . Not on file.   Social History Main Topics  . Smoking status: Never Smoker   . Smokeless tobacco: Never Used  . Alcohol Use: No  . Drug Use: No  . Sexually Active: Not on file   Other Topics Concern  . Not on file   Social History Narrative  . No narrative on file    Objective:  BP 130/72  Pulse 67  Temp(Src) 97.8 F (36.6 C) (Oral)  Resp 16  Wt 196 lb 8 oz (89.132 kg)  BMI 32.19 kg/m2  SpO2 97%  General appearance: alert, cooperative and appears stated age Ears: normal TM's and external ear canals both ears Throat: lips, mucosa, and tongue normal; teeth and gums normal Neck: no adenopathy, no carotid bruit, supple, symmetrical, trachea midline and thyroid not enlarged, symmetric, no tenderness/mass/nodules Back: symmetric, no  curvature. ROM normal. No CVA tenderness. Lungs: clear to auscultation bilaterally Heart: regular rate and rhythm, S1, S2 normal, no murmur, click, rub or gallop Abdomen: soft, non-tender; bowel sounds normal; no masses,  no organomegaly Pulses: 2+ and symmetric Skin: Skin color, texture, turgor normal. No rashes or lesions Lymph nodes: Cervical, supraclavicular, and axillary nodes normal.  Assessment and Plan:  OSA on CPAP Moderate to severe by repeat sleep study. She is sleeping well with her new CPAP mask averaging 6-8 hours a night with the mask. NO changes today andexcept to encourage weight  loss  Diabetes mellitus type II, uncontrolled Patient here to be compliant with her insulin regimen but is quite unknowledgeable about diabetes and effect of foods on her disease.  She is up-to-date on diabetic eye exams. She is on appropriate medications. She has an allergy to aspirin.  Low glycemic index diet discussed with patient today and samples of the diet given.  Other and unspecified hyperlipidemia She is a she is on a stronger statin. LDL is below 100. No changes today.  Essential hypertension, benign Managed with amlodipine and metoprolol succinate. She has a normal microalbumin to creatinine ratio but we'll consider changing her to lisinopril at next visit.  A total of 40 minutes was spent with patient more than half of which was spent in counseling, reviewing records from other prviders and coordination of care.   Updated Medication List Outpatient Encounter Prescriptions as of 09/16/2012  Medication Sig Dispense Refill  . amLODipine (NORVASC) 5 MG tablet TAKE 1 TABLET (5 MG TOTAL) BY MOUTH DAILY.  30 tablet  3  . atorvastatin (LIPITOR) 40 MG tablet Take 1 tablet (40 mg total) by mouth daily.  30 tablet  3  . furosemide (LASIX) 20 MG tablet Take 0.5 tablets (10 mg total) by mouth daily.  30 tablet  3  . glucose blood (ACCU-CHEK AVIVA PLUS) test strip 1 each by Other route 2 (two) times daily. And lancets 2/day 250.01  100 each  12  . insulin aspart (NOVOLOG FLEXPEN) 100 UNIT/ML injection 3 times a day (just before each meal) 40-40-50 units, and pen needles 4/day.  45 mL  12  . insulin glargine (LANTUS SOLOSTAR) 100 UNIT/ML injection Inject 20 Units into the skin at bedtime.  5 pen  PRN  . metoprolol succinate (TOPROL-XL) 100 MG 24 hr tablet Take 1 tablet (100 mg total) by mouth daily. Take with or immediately following a meal.  30 tablet  3  . potassium chloride SA (K-DUR,KLOR-CON) 20 MEQ tablet Take 1 tablet (20 mEq total) by mouth daily.  30 tablet  3  . TDaP (BOOSTRIX)  5-2.5-18.5 LF-MCG/0.5 injection Inject 0.5 mLs into the muscle once.  0.5 mL  0   No facility-administered encounter medications on file as of 09/16/2012.

## 2012-09-16 NOTE — Patient Instructions (Addendum)
You received the ingluenza vaccine and your second and final Pnreumonia vaccine  Your still need your tetanus-diptheria-pertussis vaccine (TDaP) but you can get it for less $$$ at a local pharmacy with the script I have provided you.   You need to start eating like a person with diabetes should be eating.  You need to follow a low glycemin index diet.  This will lower your blood sugars and your need ofr higher amounts of insulin   This is  One version of a  "Low GI"  Diet:  It will lower your blood sugars and allow you to lose 4 to 8  lbs  per month if you follow it carefully and combine it with 30 minutes of aerobic exercise 5 days per week .   All of the foods can be found at grocery stores and in bulk at [employer].  The Atkins protein bars and shakes are available in more varieties at [employer], [employer] and [employer].     7 AM Breakfast:  Choose from the following:  Low carbohydrate Protein  Shakes (I recommend the EAS AdvantEdge "Carb Control" shakes  Or the low carb shakes by Atkins.    2.5 carbs   Arnold's "Sandwhich Thin"toasted  w/ peanut butter (no jelly: about 20 net carbs  "Bagel Thin" with cream cheese and salmon: about 20 carbs   a scrambled egg/bacon/cheese burrito made with Mission's "carb balance" whole wheat tortilla  (about 10 net carbs )   Avoid cereal and bananas, oatmeal and cream of wheat and grits. They are loaded with carbohydrates!   10 AM: high protein snack  Protein bar by Atkins (the snack size, under 200 cal, usually < 6 net carbs).    A stick of cheese:  Around 1 carb,  100 cal     Dannon Light n Fit [place] Yogurt  (80 cal, 8 carbs)  Other so called "protein bars" and Greek yogurts tend to be loaded with carbohydrates.  Remember, in food advertising, the word "energy" is synonymous for " carbohydrate."  Lunch:   A Sandwich using the bread choices listed, Can use any  Eggs,  lunchmeat, grilled meat or canned tuna), avocado, regular mayo/mustard  and cheese.  A  Salad using clue cheese, ranch,  Goddess or vinagrette,  No croutons or "confetti" and no "candied nuts" but regular nuts OK.   No pretzels or chips.  Pickles and miniature sweet peppers are a good low carb alternative  The bread is the only source or carbohydrate that can be decreased (Joseph's makes a pita bread and a flat bread that are 50 cal and 4 net carbs ; Toufayan makes a low carb flatbread that's 100 cal and 9 net carbs  and  Mission's carb balance whole wheat tortilla  That is 210 cal and 6 net carbs) Avoid "Low fat dressings, as well as Reyne Dumas and [address] dressings They are loaded with sugar!   3 PM/ Mid day  Snack:  Consider  1 ounce of  almonds, walnuts, pistachios, pecans, peanuts,  Macadamia nuts or a nut medley.  Avoid "granola"; the dried cranberries and raisins are loaded with carbohydrates. Mixed nuts ok if no raisins or cranberries or dried fruit.     6 PM  Dinner:    "mean and green, "  Meat/chicken/fish with a green salad, and broccoli, cauliflower, green beans, spinach, brussel sprouts or  Lima beans::       There is a low carb pasta by [employer]  available at [facility] grocery that is acceptable and tastes great only 5 diestible carbs/serving.   Try Michel Angelo's chicken piccata or chicken or eggplant parm over low carb pasta.   Clifton Custard Sanchez's "Carnitas" (pulled pork, no sauce,  0 carbs) or his beef pot roast to make a dinner burrito  Whole wheat pasta is still full of digestible carbs and  Not as low in glycemic index as [place]'s.   Brown rice is still rice,  So skip the rice and noodles if you eat [place] or [place]  9 PM snack :   Breyer's "low carb" fudgsicle or  ice cream bar (Carb Smart line), or  Weight Watcher's ice cream bar , or another "no sugar added" ice cream;  a serving of fresh berries/cherries with whipped cream   Cheese or yoguty  Avoid bananas, pineapple, grapes  and watermelon on a regular basis because they are high in sugar)    Remember that snack Substitutions should be less than 10 carbs per serving and meals < 20 carbs. Remember to subtract fiber grams to get the "net carbs."

## 2012-09-17 ENCOUNTER — Encounter: Payer: Self-pay | Admitting: Internal Medicine

## 2012-09-17 DIAGNOSIS — I10 Essential (primary) hypertension: Secondary | ICD-10-CM | POA: Insufficient documentation

## 2012-09-17 DIAGNOSIS — E1169 Type 2 diabetes mellitus with other specified complication: Secondary | ICD-10-CM | POA: Insufficient documentation

## 2012-09-17 LAB — HEMOGLOBIN A1C: Hgb A1c MFr Bld: 9.5 % — ABNORMAL HIGH (ref 4.6–6.5)

## 2012-09-17 LAB — COMPREHENSIVE METABOLIC PANEL
Alkaline Phosphatase: 90 U/L (ref 39–117)
Glucose, Bld: 238 mg/dL — ABNORMAL HIGH (ref 70–99)
Sodium: 139 mEq/L (ref 135–145)
Total Bilirubin: 0.8 mg/dL (ref 0.3–1.2)
Total Protein: 6.7 g/dL (ref 6.0–8.3)

## 2012-09-17 LAB — LDL CHOLESTEROL, DIRECT: Direct LDL: 92.6 mg/dL

## 2012-09-17 NOTE — Assessment & Plan Note (Addendum)
Patient here to be compliant with her insulin regimen but is quite unknowledgeable about diabetes and effect of foods on her disease.  She is up-to-date on diabetic eye exams. She is on appropriate medications. She has an allergy to aspirin.  Low glycemic index diet discussed with patient today and samples of the diet given.

## 2012-09-17 NOTE — Assessment & Plan Note (Signed)
Managed with amlodipine and metoprolol succinate. She has a normal microalbumin to creatinine ratio but we'll consider changing her to lisinopril at next visit.

## 2012-09-17 NOTE — Assessment & Plan Note (Signed)
She is a she is on a stronger statin. LDL is below 100. No changes today.

## 2012-09-17 NOTE — Assessment & Plan Note (Signed)
Moderate to severe by repeat sleep study. She is sleeping well with her new CPAP mask averaging 6-8 hours a night with the mask. NO changes today andexcept to encourage weight loss

## 2012-09-20 ENCOUNTER — Encounter: Payer: Self-pay | Admitting: General Practice

## 2012-09-20 ENCOUNTER — Encounter: Payer: Self-pay | Admitting: Endocrinology

## 2012-09-20 ENCOUNTER — Ambulatory Visit (INDEPENDENT_AMBULATORY_CARE_PROVIDER_SITE_OTHER): Payer: Medicare Other | Admitting: Endocrinology

## 2012-09-20 VITALS — BP 134/78 | HR 78 | Wt 199.0 lb

## 2012-09-20 DIAGNOSIS — IMO0002 Reserved for concepts with insufficient information to code with codable children: Secondary | ICD-10-CM

## 2012-09-20 DIAGNOSIS — IMO0001 Reserved for inherently not codable concepts without codable children: Secondary | ICD-10-CM | POA: Diagnosis not present

## 2012-09-20 DIAGNOSIS — E1165 Type 2 diabetes mellitus with hyperglycemia: Secondary | ICD-10-CM

## 2012-09-20 MED ORDER — INSULIN ASPART 100 UNIT/ML ~~LOC~~ SOLN: SUBCUTANEOUS | Status: DC

## 2012-09-20 NOTE — Progress Notes (Signed)
Subjective:    Patient ID: Julia Banks, female    DOB: May 11, 1941, 72 y.o.   MRN: [id number]  HPI Pt returns for f/u of insulin-requiring DM (dx'ed 1994; complicated by CAD).  she brings a record of her cbg's which i have reviewed today.  It varies from 89-400's.  There is no trend throughout the day, but she says interpretation has been limited by a recent flu-like illness.  pt states she feels well in general.   Past Medical History  Diagnosis Date  . Asthma   . Arthritis   . Headaches, cluster   . Diabetes mellitus without complication   . Colon polyps   . UTI (lower urinary tract infection)     Past Surgical History  Procedure Laterality Date  . Gallbladder surgery    . Breast surgery    . Appendectomy    . Abdominal hysterectomy      History   Social History  . Marital Status: Married    Spouse Name: N/A    Number of Children: N/A  . Years of Education: N/A   Occupational History  . Not on file.   Social History Main Topics  . Smoking status: Never Smoker   . Smokeless tobacco: Never Used  . Alcohol Use: No  . Drug Use: No  . Sexually Active: Not on file   Other Topics Concern  . Not on file   Social History Narrative  . No narrative on file    Current Outpatient Prescriptions on File Prior to Visit  Medication Sig Dispense Refill  . amLODipine (NORVASC) 5 MG tablet TAKE 1 TABLET (5 MG TOTAL) BY MOUTH DAILY.  30 tablet  3  . atorvastatin (LIPITOR) 40 MG tablet Take 1 tablet (40 mg total) by mouth daily.  30 tablet  3  . furosemide (LASIX) 20 MG tablet Take 0.5 tablets (10 mg total) by mouth daily.  30 tablet  3  . glucose blood (ACCU-CHEK AVIVA PLUS) test strip 1 each by Other route 2 (two) times daily. And lancets 2/day 250.01  100 each  12  . insulin glargine (LANTUS SOLOSTAR) 100 UNIT/ML injection Inject 20 Units into the skin at bedtime.  5 pen  PRN  . metoprolol succinate (TOPROL-XL) 100 MG 24 hr tablet Take 1 tablet (100 mg total) by mouth daily.  Take with or immediately following a meal.  30 tablet  3  . potassium chloride SA (K-DUR,KLOR-CON) 20 MEQ tablet Take 1 tablet (20 mEq total) by mouth daily.  30 tablet  3  . TDaP (BOOSTRIX) 5-2.5-18.5 LF-MCG/0.5 injection Inject 0.5 mLs into the muscle once.  0.5 mL  0   No current facility-administered medications on file prior to visit.    Allergies  Allergen Reactions  . Altace (Ramipril)   . Aspirin     Family History  Problem Relation Age of Onset  . Arthritis Mother   . Hyperlipidemia Mother   . Stroke Mother   . Hypertension Mother   . Diabetes Mother   . Arthritis Father   . Hyperlipidemia Father   . Stroke Father   . Hypertension Father   . Diabetes Father     BP 134/78  Pulse 78  Wt 199 lb (90.266 kg)  BMI 32.6 kg/m2  SpO2 97%    Review of Systems denies hypoglycemia.      Objective:   Physical Exam VITAL SIGNS:  See vs page GENERAL: no distress PSYCH: Alert and oriented x 3.  Does not appear  anxious nor depressed.     Assessment & Plan:  DM: needs increased rx

## 2012-09-20 NOTE — Patient Instructions (Addendum)
check your blood sugar twice a day.  vary the time of day when you check, between before the 3 meals, and at bedtime.  also check if you have symptoms of your blood sugar being too high or too low.  please keep a record of the readings and bring it to your next appointment here.  please call us sooner if your blood sugar goes below 70, or if you have a lot of readings over 200.   Increase the novolog to 3 times a day (just before each meal) 45-45-55 units.   Please continue the same lantus.  Please come back for a follow-up appointment in 1 month.

## 2012-09-25 ENCOUNTER — Other Ambulatory Visit: Payer: Self-pay | Admitting: Internal Medicine

## 2012-09-28 HISTORY — PX: CARDIOVASCULAR STRESS TEST: SHX262

## 2012-10-04 ENCOUNTER — Encounter: Payer: Self-pay | Admitting: Cardiovascular Disease

## 2012-10-04 ENCOUNTER — Ambulatory Visit (INDEPENDENT_AMBULATORY_CARE_PROVIDER_SITE_OTHER): Payer: Medicare Other | Admitting: Cardiovascular Disease

## 2012-10-04 VITALS — BP 164/66 | HR 68 | Ht 66.5 in | Wt 206.5 lb

## 2012-10-04 DIAGNOSIS — R079 Chest pain, unspecified: Secondary | ICD-10-CM | POA: Diagnosis not present

## 2012-10-04 DIAGNOSIS — I10 Essential (primary) hypertension: Secondary | ICD-10-CM | POA: Diagnosis not present

## 2012-10-04 DIAGNOSIS — I2510 Atherosclerotic heart disease of native coronary artery without angina pectoris: Secondary | ICD-10-CM | POA: Diagnosis not present

## 2012-10-04 NOTE — Assessment & Plan Note (Signed)
There is no history of mild nonobstructive coronary artery disease on previous cardiac catheterization in 2006. There is very high chance of progression of atherosclerosis given her multiple uncontrolled risk factors especially diabetes. Her current left arm and chest discomfort has somewhat atypical features for angina with no clear correlation to physical activities. However, it appears that she cut down on physical activities when she started having symptoms. Baseline resting ECG does not show significant ischemic changes. I recommend further evaluation with a pharmacologic nuclear stress test. She's not able to exercise on a treadmill for a long time due to flatfeet. Given her multiple risk factors for coronary artery disease, I will have a low threshold for cardiac catheterization if her symptoms persist. The patient's request this to be done at [facility] If determined to be needed.

## 2012-10-04 NOTE — Progress Notes (Signed)
HPI  This is a pleasant 72 year old female who is here today for evaluation of left arm pain and chest discomfort. He is a patient of Dr. Darrick Huntsman. She moved from [facility] more than a year ago. She used to be seen by a cardiologist there, Dr. Deberah Castle of [place] [facility]. She had cardiac catheterization done in 2006 which showed mild 30% disease in one of the coronary arteries. Most recent stress test was in 2001 showed no evidence of ischemia. The patient has multiple risk factors for coronary artery disease including diabetes which has been mostly uncontrolled, hypertension, hyperlipidemia and obesity. There is also family history of premature coronary artery disease. She reports having the flu vaccine in March. Since then, she reports not feeling well with increased shortness of breath. Recently, she started having left arm discomfort while she was working in the yard. Shortly after that she started having substernal chest pain mostly at rest with slight worsening with physical activities. This was prolonged on Thursday and Friday but improved over the weekend. She feels better today.  Allergies  Allergen Reactions  . Altace (Ramipril)   . Aspirin      Current Outpatient Prescriptions on File Prior to Visit  Medication Sig Dispense Refill  . amLODipine (NORVASC) 5 MG tablet TAKE 1 TABLET (5 MG TOTAL) BY MOUTH DAILY.  30 tablet  3  . atorvastatin (LIPITOR) 40 MG tablet Take 1 tablet (40 mg total) by mouth daily.  30 tablet  3  . furosemide (LASIX) 20 MG tablet TAKE 1/2 TABLET (10 MG TOTAL) BY MOUTH DAILY.  30 tablet  3  . glucose blood (ACCU-CHEK AVIVA PLUS) test strip 1 each by Other route 2 (two) times daily. And lancets 2/day 250.01  100 each  12  . insulin aspart (NOVOLOG FLEXPEN) 100 UNIT/ML injection 3 times a day (just before each meal) 45-45-55 units, and pen needles 4/day.  45 mL  12  . insulin glargine (LANTUS SOLOSTAR) 100 UNIT/ML injection Inject 20  Units into the skin at bedtime.  5 pen  PRN  . metoprolol succinate (TOPROL-XL) 100 MG 24 hr tablet Take 1 tablet (100 mg total) by mouth daily. Take with or immediately following a meal.  30 tablet  3  . potassium chloride SA (K-DUR,KLOR-CON) 20 MEQ tablet Take 1 tablet (20 mEq total) by mouth daily.  30 tablet  3  . TDaP (BOOSTRIX) 5-2.5-18.5 LF-MCG/0.5 injection Inject 0.5 mLs into the muscle once.  0.5 mL  0   No current facility-administered medications on file prior to visit.     Past Medical History  Diagnosis Date  . Asthma   . Arthritis   . Headaches, cluster   . Diabetes mellitus without complication   . Colon polyps   . UTI (lower urinary tract infection)   . Hyperlipidemia   . Hypertension   . GERD (gastroesophageal reflux disease)   . Sleep apnea   . Coronary artery disease     30% stenosis reported on previous cardiac catheterization in 2006. This was done in [facility]     Past Surgical History  Procedure Laterality Date  . Gallbladder surgery    . Breast surgery    . Appendectomy    . Abdominal hysterectomy    . Cardiac catheterization  2006    [place], [place].      Family History  Problem Relation Age of Onset  . Arthritis Mother   . Hyperlipidemia Mother   .  Stroke Mother   . Hypertension Mother   . Diabetes Mother   . Arthritis Father   . Hyperlipidemia Father   . Stroke Father   . Hypertension Father   . Diabetes Father      History   Social History  . Marital Status: Married    Spouse Name: N/A    Number of Children: N/A  . Years of Education: N/A   Occupational History  . Not on file.   Social History Main Topics  . Smoking status: Never Smoker   . Smokeless tobacco: Never Used  . Alcohol Use: No  . Drug Use: No  . Sexually Active: Not on file   Other Topics Concern  . Not on file   Social History Narrative  . No narrative on file     ROS Constitutional: Negative for fever, chills, diaphoresis, activity  change, appetite change and fatigue.  HENT: Negative for hearing loss, nosebleeds, congestion, sore throat, facial swelling, drooling, trouble swallowing, neck pain, voice change, sinus pressure and tinnitus.  Eyes: Negative for photophobia, pain, discharge and visual disturbance.  Respiratory: Negative for apnea, cough and wheezing.  Cardiovascular: Negative for  palpitations and leg swelling.  Gastrointestinal: Negative for nausea, vomiting, abdominal pain, diarrhea, constipation, blood in stool and abdominal distention.  Genitourinary: Negative for dysuria, urgency, frequency, hematuria and decreased urine volume.  Musculoskeletal: Negative for myalgias, back pain, joint swelling, arthralgias and gait problem.  Skin: Negative for color change, pallor, rash and wound.  Neurological: Negative for dizziness, tremors, seizures, syncope, speech difficulty, weakness, light-headedness, numbness and headaches.  Psychiatric/Behavioral: Negative for suicidal ideas, hallucinations, behavioral problems and agitation. The patient is not nervous/anxious.     PHYSICAL EXAM   BP 164/66  Pulse 68  Ht 5' 6.5" (1.689 m)  Wt 206 lb 8 oz (93.668 kg)  BMI 32.83 kg/m2 Constitutional: She is oriented to person, place, and time. She appears well-developed and well-nourished. No distress.  HENT: No nasal discharge.  Head: Normocephalic and atraumatic.  Eyes: Pupils are equal and round. Right eye exhibits no discharge. Left eye exhibits no discharge.  Neck: Normal range of motion. Neck supple. No JVD present. No thyromegaly present.  Cardiovascular: Normal rate, regular rhythm, normal heart sounds. Exam reveals no gallop and no friction rub. No murmur heard.  Pulmonary/Chest: Effort normal and breath sounds normal. No stridor. No respiratory distress. She has no wheezes. She has no rales. She exhibits no tenderness.  Abdominal: Soft. Bowel sounds are normal. She exhibits no distension. There is no tenderness.  There is no rebound and no guarding.  Musculoskeletal: Normal range of motion. She exhibits trace edema and no tenderness.  Neurological: She is alert and oriented to person, place, and time. Coordination normal.  Skin: Skin is warm and dry. No rash noted. She is not diaphoretic. No erythema. No pallor.  Psychiatric: She has a normal mood and affect. Her behavior is normal. Judgment and thought content normal.     EKG: Sinus  Rhythm  Low voltage in precordial leads.   -  Nonspecific T-abnormality.   ABNORMAL    ASSESSMENT AND PLAN

## 2012-10-04 NOTE — Patient Instructions (Addendum)
Your physician has requested that you have a lexiscan myoview. For further information please visit [email or web address]. Please follow instruction sheet, as given.   

## 2012-10-08 ENCOUNTER — Ambulatory Visit: Payer: Self-pay | Admitting: Cardiovascular Disease

## 2012-10-08 ENCOUNTER — Other Ambulatory Visit: Payer: Self-pay

## 2012-10-08 DIAGNOSIS — R079 Chest pain, unspecified: Secondary | ICD-10-CM | POA: Diagnosis not present

## 2012-10-08 DIAGNOSIS — R0602 Shortness of breath: Secondary | ICD-10-CM | POA: Diagnosis not present

## 2012-10-11 NOTE — Progress Notes (Signed)
Pt informed of stress test result. 

## 2012-10-14 ENCOUNTER — Telehealth: Payer: Self-pay | Admitting: Internal Medicine

## 2012-10-18 ENCOUNTER — Encounter: Payer: Self-pay | Admitting: Endocrinology

## 2012-10-18 ENCOUNTER — Ambulatory Visit (INDEPENDENT_AMBULATORY_CARE_PROVIDER_SITE_OTHER): Payer: Medicare Other | Admitting: Endocrinology

## 2012-10-18 VITALS — BP 126/70 | HR 80 | Wt 205.0 lb

## 2012-10-18 DIAGNOSIS — IMO0001 Reserved for inherently not codable concepts without codable children: Secondary | ICD-10-CM | POA: Diagnosis not present

## 2012-10-18 DIAGNOSIS — E1165 Type 2 diabetes mellitus with hyperglycemia: Secondary | ICD-10-CM

## 2012-10-18 DIAGNOSIS — IMO0002 Reserved for concepts with insufficient information to code with codable children: Secondary | ICD-10-CM

## 2012-10-18 NOTE — Progress Notes (Signed)
Subjective:    Patient ID: Julia Banks, female    DOB: December 22, 1940, 72 y.o.   MRN: [id number]  HPI Pt returns for f/u of insulin-requiring DM (dx'ed 1994; complicated by peripheral sensory neuropathy and CAD; she has never had severe hypoglycemia or DKA).  she brings a record of her cbg's which i have reviewed today.  It varies from 86-293.  There is no trend throughout the day.  pt states she feels well in general.  Past Medical History  Diagnosis Date  . Asthma   . Arthritis   . Headaches, cluster   . Diabetes mellitus without complication   . Colon polyps   . UTI (lower urinary tract infection)   . Hyperlipidemia   . Hypertension   . GERD (gastroesophageal reflux disease)   . Sleep apnea   . Coronary artery disease     30% stenosis reported on previous cardiac catheterization in 2006. This was done in [facility]    Past Surgical History  Procedure Laterality Date  . Gallbladder surgery    . Breast surgery    . Appendectomy    . Abdominal hysterectomy    . Cardiac catheterization  2006    [place] Smoker.     History   Social History  . Marital Status: Married    Spouse Name: N/A    Number of Children: N/A  . Years of Education: N/A   Occupational History  . Not on file.   Social History Main Topics  . Smoking status: Never Smoker   . Smokeless tobacco: Never Used  . Alcohol Use: No  . Drug Use: No  . Sexually Active: Not on file   Other Topics Concern  . Not on file   Social History Narrative  . No narrative on file    Current Outpatient Prescriptions on File Prior to Visit  Medication Sig Dispense Refill  . amLODipine (NORVASC) 5 MG tablet TAKE 1 TABLET (5 MG TOTAL) BY MOUTH DAILY.  30 tablet  3  . atorvastatin (LIPITOR) 40 MG tablet Take 1 tablet (40 mg total) by mouth daily.  30 tablet  3  . B-D UF III MINI PEN NEEDLES 31G X 5 MM MISC       . furosemide (LASIX) 20 MG tablet TAKE 1/2 TABLET (10 MG TOTAL) BY MOUTH DAILY.  30 tablet  3   . glucose blood (ACCU-CHEK AVIVA PLUS) test strip 1 each by Other route 2 (two) times daily. And lancets 2/day 250.01  100 each  12  . insulin aspart (NOVOLOG FLEXPEN) 100 UNIT/ML injection 3 times a day (just before each meal) 45-45-55 units, and pen needles 4/day.  45 mL  12  . insulin glargine (LANTUS SOLOSTAR) 100 UNIT/ML injection Inject 20 Units into the skin at bedtime.  5 pen  PRN  . metoprolol succinate (TOPROL-XL) 100 MG 24 hr tablet Take 1 tablet (100 mg total) by mouth daily. Take with or immediately following a meal.  30 tablet  3  . potassium chloride SA (K-DUR,KLOR-CON) 20 MEQ tablet Take 1 tablet (20 mEq total) by mouth daily.  30 tablet  3  . TDaP (BOOSTRIX) 5-2.5-18.5 LF-MCG/0.5 injection Inject 0.5 mLs into the muscle once.  0.5 mL  0   No current facility-administered medications on file prior to visit.    Allergies  Allergen Reactions  . Altace (Ramipril)   . Aspirin     Family History  Problem Relation Age of Onset  . Arthritis Mother   .  Hyperlipidemia Mother   . Stroke Mother   . Hypertension Mother   . Diabetes Mother   . Arthritis Father   . Hyperlipidemia Father   . Stroke Father   . Hypertension Father   . Diabetes Father     BP 126/70  Pulse 80  Wt 205 lb (92.987 kg)  BMI 32.6 kg/m2  SpO2 96%  Review of Systems denies hypoglycemia.      Objective:   Physical Exam VITAL SIGNS:  See vs page GENERAL: no distress Pulses: dorsalis pedis intact bilat.   Feet: no deformity.  no ulcer on the feet.  feet are of normal color and temp.  no edema Neuro: sensation is intact to touch on the feet, but slightly decreased from normal     Assessment & Plan:  DM: control is much better

## 2012-10-18 NOTE — Patient Instructions (Addendum)
check your blood sugar twice a day.  vary the time of day when you check, between before the 3 meals, and at bedtime.  also check if you have symptoms of your blood sugar being too high or too low.  please keep a record of the readings and bring it to your next appointment here.  please call us sooner if your blood sugar goes below 70, or if you have a lot of readings over 200.   Please continue the same insulins.  Please come back for a follow-up appointment in 2 months.

## 2012-10-23 ENCOUNTER — Other Ambulatory Visit: Payer: Self-pay | Admitting: Internal Medicine

## 2012-11-05 ENCOUNTER — Encounter: Payer: Self-pay | Admitting: Internal Medicine

## 2012-12-06 DIAGNOSIS — E1149 Type 2 diabetes mellitus with other diabetic neurological complication: Secondary | ICD-10-CM | POA: Diagnosis not present

## 2012-12-06 DIAGNOSIS — L851 Acquired keratosis [keratoderma] palmaris et plantaris: Secondary | ICD-10-CM | POA: Diagnosis not present

## 2012-12-06 DIAGNOSIS — G909 Disorder of the autonomic nervous system, unspecified: Secondary | ICD-10-CM | POA: Diagnosis not present

## 2012-12-06 DIAGNOSIS — B351 Tinea unguium: Secondary | ICD-10-CM | POA: Diagnosis not present

## 2012-12-06 DIAGNOSIS — IMO0001 Reserved for inherently not codable concepts without codable children: Secondary | ICD-10-CM | POA: Diagnosis not present

## 2012-12-13 ENCOUNTER — Other Ambulatory Visit: Payer: Self-pay | Admitting: Internal Medicine

## 2012-12-17 DIAGNOSIS — R509 Fever, unspecified: Secondary | ICD-10-CM | POA: Diagnosis not present

## 2012-12-17 DIAGNOSIS — J209 Acute bronchitis, unspecified: Secondary | ICD-10-CM | POA: Diagnosis not present

## 2012-12-17 DIAGNOSIS — R059 Cough, unspecified: Secondary | ICD-10-CM | POA: Diagnosis not present

## 2012-12-17 DIAGNOSIS — R05 Cough: Secondary | ICD-10-CM | POA: Diagnosis not present

## 2012-12-17 DIAGNOSIS — R51 Headache: Secondary | ICD-10-CM | POA: Diagnosis not present

## 2012-12-17 DIAGNOSIS — R062 Wheezing: Secondary | ICD-10-CM | POA: Diagnosis not present

## 2012-12-17 DIAGNOSIS — R110 Nausea: Secondary | ICD-10-CM | POA: Diagnosis not present

## 2012-12-20 ENCOUNTER — Ambulatory Visit (INDEPENDENT_AMBULATORY_CARE_PROVIDER_SITE_OTHER): Payer: Medicare Other | Admitting: Endocrinology

## 2012-12-20 ENCOUNTER — Encounter: Payer: Self-pay | Admitting: Adult Health

## 2012-12-20 ENCOUNTER — Encounter: Payer: Self-pay | Admitting: Endocrinology

## 2012-12-20 ENCOUNTER — Ambulatory Visit (INDEPENDENT_AMBULATORY_CARE_PROVIDER_SITE_OTHER): Payer: Medicare Other | Admitting: Adult Health

## 2012-12-20 VITALS — BP 134/70 | HR 77 | Ht 66.0 in | Wt 209.0 lb

## 2012-12-20 VITALS — BP 128/56 | HR 66 | Temp 98.3°F | Resp 12 | Wt 209.5 lb

## 2012-12-20 DIAGNOSIS — R059 Cough, unspecified: Secondary | ICD-10-CM | POA: Diagnosis not present

## 2012-12-20 DIAGNOSIS — E1165 Type 2 diabetes mellitus with hyperglycemia: Secondary | ICD-10-CM

## 2012-12-20 DIAGNOSIS — IMO0002 Reserved for concepts with insufficient information to code with codable children: Secondary | ICD-10-CM

## 2012-12-20 DIAGNOSIS — IMO0001 Reserved for inherently not codable concepts without codable children: Secondary | ICD-10-CM

## 2012-12-20 DIAGNOSIS — R05 Cough: Secondary | ICD-10-CM | POA: Diagnosis not present

## 2012-12-20 DIAGNOSIS — R509 Fever, unspecified: Secondary | ICD-10-CM | POA: Diagnosis not present

## 2012-12-20 LAB — POCT INFLUENZA A/B
Influenza A, POC: NEGATIVE
Influenza B, POC: NEGATIVE

## 2012-12-20 MED ORDER — INSULIN ASPART PROT & ASPART (70-30 MIX) 100 UNIT/ML PEN: 50.0000 [IU] | PEN_INJECTOR | Freq: Two times a day (BID) | SUBCUTANEOUS | Status: DC

## 2012-12-20 MED ORDER — GUAIFENESIN-CODEINE 100-10 MG/5ML PO SOLN: 5.0000 mL | Freq: Three times a day (TID) | ORAL | Status: DC | PRN

## 2012-12-20 NOTE — Patient Instructions (Addendum)
check your blood sugar twice a day.  vary the time of day when you check, between before the 3 meals, and at bedtime.  also check if you have symptoms of your blood sugar being too high or too low.  please keep a record of the readings and bring it to your next appointment here.  please call us sooner if your blood sugar goes below 70, or if you have a lot of readings over 200.   blood tests are being requested for you today.  We'll contact you with results.  We'll take the steroids into account.   Please continue the same insulins.   Please come back for a follow-up appointment in 3 months.

## 2012-12-20 NOTE — Assessment & Plan Note (Addendum)
Influenza negative. Currently on amoxicillin after being seen at urgent care on Friday. Persistent cough which is very irritating and interrupts sleep. Continue amoxicillin. Add Robitussin AC for severe cough. Tylenol or ibuprofen for general aches and pains and for fever. Push fluids. RTC if symptoms are not improved by Thursday morning.

## 2012-12-20 NOTE — Patient Instructions (Signed)
  Continue the amoxicillin.  I am ordering Robitussin AC for severe cough. He may take this 3 times a day as needed for cough.  Drink plenty of fluids.  Take Tylenol or ibuprofen for general aches and discomfort as well as for fever.

## 2012-12-20 NOTE — Progress Notes (Signed)
Subjective:    Patient ID: Julia Banks, female    DOB: 1940/11/20, 72 y.o.   MRN: [id number]  HPI Pt returns for f/u of insulin-requiring DM (dx'ed 1994; complicated by moderate sensory neuropathy of the lower extremities; she has associated CAD; she has never had severe hypoglycemia or DKA).  she brings a record of her cbg's which i have reviewed today.  Since she was rx'ed with steroids last week, cbg's are in the 300's.  Prior to that, it varied from 73-200.  There is no trend throughout the day.  Past Medical History  Diagnosis Date  . Asthma   . Arthritis   . Headaches, cluster   . Diabetes mellitus without complication   . Colon polyps   . UTI (lower urinary tract infection)   . Hyperlipidemia   . Hypertension   . GERD (gastroesophageal reflux disease)   . Sleep apnea   . Coronary artery disease     30% stenosis reported on previous cardiac catheterization in 2006. This was done in [facility]    Past Surgical History  Procedure Laterality Date  . Gallbladder surgery    . Breast surgery    . Appendectomy    . Abdominal hysterectomy    . Cardiac catheterization  2006    [place] Smoker.     History   Social History  . Marital Status: Married    Spouse Name: N/A    Number of Children: N/A  . Years of Education: N/A   Occupational History  . Not on file.   Social History Main Topics  . Smoking status: Never Smoker   . Smokeless tobacco: Never Used  . Alcohol Use: No  . Drug Use: No  . Sexually Active: Not on file   Other Topics Concern  . Not on file   Social History Narrative  . No narrative on file    Current Outpatient Prescriptions on File Prior to Visit  Medication Sig Dispense Refill  . amLODipine (NORVASC) 5 MG tablet TAKE 1 TABLET (5 MG TOTAL) BY MOUTH DAILY.  30 tablet  5  . atorvastatin (LIPITOR) 40 MG tablet TAKE 1 TABLET (40 MG TOTAL) BY MOUTH DAILY.  30 tablet  3  . B-D UF III MINI PEN NEEDLES 31G X 5 MM MISC       .  furosemide (LASIX) 20 MG tablet TAKE 1/2 TABLET (10 MG TOTAL) BY MOUTH DAILY.  30 tablet  3  . glucose blood (ACCU-CHEK AVIVA PLUS) test strip 1 each by Other route 2 (two) times daily. And lancets 2/day 250.01  100 each  12  . KLOR-CON M20 20 MEQ tablet TAKE 1 TABLET (20 MEQ TOTAL) BY MOUTH DAILY.  30 tablet  3  . metoprolol succinate (TOPROL-XL) 100 MG 24 hr tablet TAKE 1 TABLET BY MOUTH DAILY. TAKE WITH OR IMMEDIATELY FOLLOWING A MEAL.  30 tablet  3  . TDaP (BOOSTRIX) 5-2.5-18.5 LF-MCG/0.5 injection Inject 0.5 mLs into the muscle once.  0.5 mL  0   No current facility-administered medications on file prior to visit.    Allergies  Allergen Reactions  . Altace (Ramipril)   . Aspirin     Family History  Problem Relation Age of Onset  . Arthritis Mother   . Hyperlipidemia Mother   . Stroke Mother   . Hypertension Mother   . Diabetes Mother   . Arthritis Father   . Hyperlipidemia Father   . Stroke Father   . Hypertension Father   .  Diabetes Father     BP 134/70  Pulse 77  Ht 5\' 6"  (1.676 m)  Wt 209 lb (94.802 kg)  BMI 33.75 kg/m2  SpO2 97%  Review of Systems Denies LOC.  She has gained weight.      Objective:   Physical Exam VITAL SIGNS:  See vs page.  GENERAL: no distress.   Lab Results  Component Value Date   HGBA1C 9.1* 12/20/2012      Assessment & Plan:  DM: This new insulin regimen was chosen from multiple options, for its simplicity.  The benefits of glycemic control must be weighed against the risks of hypoglycemia.

## 2012-12-20 NOTE — Progress Notes (Signed)
Subjective:    Patient ID: Julia Banks, female    DOB: 1941-03-12, 72 y.o.   MRN: [id number]  HPI  Patient is a pleasant 72 y/o female who presents to clinic with 5 week hx of dry cough. Hx of asthma. She was seen at [facility] on Friday and was started on Amoxicillin 875 mg bid x 10 days for suspected sinus infection. Now patient presents with fever which started yesterday. She did not take her temperature but felt extremely chilled and was "freezing". She has taken tylenol. Cough is persistent but seems to be worse in the morning and at night and is interrupting sleep. Denies sinus congestion, rhinorrhea or shortness of breath.    Current Outpatient Prescriptions on File Prior to Visit  Medication Sig Dispense Refill  . amLODipine (NORVASC) 5 MG tablet TAKE 1 TABLET (5 MG TOTAL) BY MOUTH DAILY.  30 tablet  5  . atorvastatin (LIPITOR) 40 MG tablet TAKE 1 TABLET (40 MG TOTAL) BY MOUTH DAILY.  30 tablet  3  . B-D UF III MINI PEN NEEDLES 31G X 5 MM MISC       . furosemide (LASIX) 20 MG tablet TAKE 1/2 TABLET (10 MG TOTAL) BY MOUTH DAILY.  30 tablet  3  . glucose blood (ACCU-CHEK AVIVA PLUS) test strip 1 each by Other route 2 (two) times daily. And lancets 2/day 250.01  100 each  12  . insulin aspart (NOVOLOG FLEXPEN) 100 UNIT/ML injection 3 times a day (just before each meal) 45-45-55 units, and pen needles 4/day.  45 mL  12  . insulin glargine (LANTUS SOLOSTAR) 100 UNIT/ML injection Inject 20 Units into the skin at bedtime.  5 pen  PRN  . KLOR-CON M20 20 MEQ tablet TAKE 1 TABLET (20 MEQ TOTAL) BY MOUTH DAILY.  30 tablet  3  . metoprolol succinate (TOPROL-XL) 100 MG 24 hr tablet TAKE 1 TABLET BY MOUTH DAILY. TAKE WITH OR IMMEDIATELY FOLLOWING A MEAL.  30 tablet  3  . TDaP (BOOSTRIX) 5-2.5-18.5 LF-MCG/0.5 injection Inject 0.5 mLs into the muscle once.  0.5 mL  0   No current facility-administered medications on file prior to visit.     Review of Systems  Constitutional: Positive for  fever and chills.  HENT: Positive for sore throat. Negative for congestion, rhinorrhea, postnasal drip and sinus pressure.        This morning woke up with sore throat. Improved as the day progressed.  Respiratory: Positive for cough. Negative for shortness of breath and wheezing.   Cardiovascular: Negative for chest pain.  Gastrointestinal: Positive for nausea. Negative for vomiting and diarrhea.  Genitourinary: Negative.   Musculoskeletal:       General aches and pain.  Neurological: Negative for dizziness, syncope and light-headedness.  Psychiatric/Behavioral: Negative for behavioral problems, confusion and agitation. The patient is not nervous/anxious.     BP 128/56  Pulse 66  Temp(Src) 98.3 F (36.8 C) (Oral)  Resp 12  Wt 209 lb 8 oz (95.029 kg)  BMI 33.83 kg/m2  SpO2 97%    Objective:   Physical Exam  Constitutional: She is oriented to person, place, and time. She appears well-developed and well-nourished.  Ill appearing  HENT:  Head: Normocephalic and atraumatic.  Right Ear: External ear normal.  Left Ear: External ear normal.  Pharyngeal erythema  Neck: Normal range of motion. Neck supple.  Cardiovascular: Normal rate, regular rhythm, normal heart sounds and intact distal pulses.  Exam reveals no gallop and no friction rub.  No murmur heard. Pulmonary/Chest: Effort normal and breath sounds normal. No respiratory distress. She has no wheezes. She has no rales.  Musculoskeletal: Normal range of motion.  Lymphadenopathy:    She has no cervical adenopathy.  Neurological: She is alert and oriented to person, place, and time.  Skin: Skin is warm and dry.  Psychiatric: She has a normal mood and affect. Her behavior is normal. Judgment and thought content normal.       Assessment & Plan:

## 2012-12-23 DIAGNOSIS — J209 Acute bronchitis, unspecified: Secondary | ICD-10-CM | POA: Diagnosis not present

## 2012-12-23 DIAGNOSIS — R300 Dysuria: Secondary | ICD-10-CM | POA: Diagnosis not present

## 2012-12-23 DIAGNOSIS — R51 Headache: Secondary | ICD-10-CM | POA: Diagnosis not present

## 2012-12-23 DIAGNOSIS — R059 Cough, unspecified: Secondary | ICD-10-CM | POA: Diagnosis not present

## 2012-12-23 DIAGNOSIS — R05 Cough: Secondary | ICD-10-CM | POA: Diagnosis not present

## 2012-12-23 DIAGNOSIS — E119 Type 2 diabetes mellitus without complications: Secondary | ICD-10-CM | POA: Diagnosis not present

## 2012-12-23 DIAGNOSIS — IMO0001 Reserved for inherently not codable concepts without codable children: Secondary | ICD-10-CM | POA: Diagnosis not present

## 2012-12-23 DIAGNOSIS — R509 Fever, unspecified: Secondary | ICD-10-CM | POA: Diagnosis not present

## 2012-12-23 DIAGNOSIS — J40 Bronchitis, not specified as acute or chronic: Secondary | ICD-10-CM | POA: Diagnosis not present

## 2012-12-23 DIAGNOSIS — R5381 Other malaise: Secondary | ICD-10-CM | POA: Diagnosis not present

## 2013-02-14 ENCOUNTER — Other Ambulatory Visit: Payer: Self-pay | Admitting: Internal Medicine

## 2013-03-07 ENCOUNTER — Other Ambulatory Visit: Payer: Self-pay | Admitting: Internal Medicine

## 2013-03-07 DIAGNOSIS — Z124 Encounter for screening for malignant neoplasm of cervix: Secondary | ICD-10-CM | POA: Diagnosis not present

## 2013-03-08 ENCOUNTER — Other Ambulatory Visit: Payer: Self-pay | Admitting: Internal Medicine

## 2013-03-08 DIAGNOSIS — IMO0001 Reserved for inherently not codable concepts without codable children: Secondary | ICD-10-CM | POA: Diagnosis not present

## 2013-03-08 DIAGNOSIS — L851 Acquired keratosis [keratoderma] palmaris et plantaris: Secondary | ICD-10-CM | POA: Diagnosis not present

## 2013-03-08 DIAGNOSIS — B351 Tinea unguium: Secondary | ICD-10-CM | POA: Diagnosis not present

## 2013-03-08 NOTE — Telephone Encounter (Signed)
Eprescribed.

## 2013-03-22 ENCOUNTER — Ambulatory Visit: Payer: Medicare Other | Admitting: Internal Medicine

## 2013-03-22 ENCOUNTER — Encounter: Payer: Self-pay | Admitting: Endocrinology

## 2013-03-22 ENCOUNTER — Ambulatory Visit (INDEPENDENT_AMBULATORY_CARE_PROVIDER_SITE_OTHER): Payer: Medicare Other | Admitting: Endocrinology

## 2013-03-22 VITALS — BP 126/70 | HR 80 | Wt 205.0 lb

## 2013-03-22 DIAGNOSIS — R5381 Other malaise: Secondary | ICD-10-CM | POA: Diagnosis not present

## 2013-03-22 DIAGNOSIS — Z1231 Encounter for screening mammogram for malignant neoplasm of breast: Secondary | ICD-10-CM | POA: Diagnosis not present

## 2013-03-22 DIAGNOSIS — M899 Disorder of bone, unspecified: Secondary | ICD-10-CM | POA: Diagnosis not present

## 2013-03-22 DIAGNOSIS — IMO0001 Reserved for inherently not codable concepts without codable children: Secondary | ICD-10-CM

## 2013-03-22 DIAGNOSIS — E109 Type 1 diabetes mellitus without complications: Secondary | ICD-10-CM | POA: Diagnosis not present

## 2013-03-22 DIAGNOSIS — IMO0002 Reserved for concepts with insufficient information to code with codable children: Secondary | ICD-10-CM

## 2013-03-22 DIAGNOSIS — E1165 Type 2 diabetes mellitus with hyperglycemia: Secondary | ICD-10-CM

## 2013-03-22 LAB — VITAMIN D 25 HYDROXY (VIT D DEFICIENCY, FRACTURES): Vit D, 25-Hydroxy: 46

## 2013-03-22 LAB — COMPREHENSIVE METABOLIC PANEL
ALT: 26 U/L (ref 7–35)
Alkaline Phosphatase: 113 U/L
BUN: 13 mg/dL (ref 4–21)
Glucose: 390

## 2013-03-22 LAB — CBC
WBC: 6.4
platelet count: 143

## 2013-03-22 LAB — THYROID PANEL
T3, Free: 2.7
T4,Free (Direct): 1.11

## 2013-03-22 LAB — HEMOGLOBIN A1C: Hgb A1c MFr Bld: 10.5 % — ABNORMAL HIGH (ref 4.6–6.5)

## 2013-03-22 NOTE — Progress Notes (Signed)
Subjective:    Patient ID: Julia Banks, female    DOB: [birth year], 72 y.o.   MRN: [id number]  HPI Pt returns for f/u of insulin-requiring DM (dx'ed 1994; complicated by moderate sensory neuropathy of the lower extremities; she has associated CAD; she has never had severe hypoglycemia or DKA).   She is still on the qac/qhs insulins.  She says cbg's are persistently high.  She wants to increase cbg's to qid.  pt states she feels well in general. Past Medical History  Diagnosis Date  . Asthma   . Arthritis   . Headaches, cluster   . Diabetes mellitus without complication   . Colon polyps   . UTI (lower urinary tract infection)   . Hyperlipidemia   . Hypertension   . GERD (gastroesophageal reflux disease)   . Sleep apnea   . Coronary artery disease     30% stenosis reported on previous cardiac catheterization in 2006. This was done in [facility]    Past Surgical History  Procedure Laterality Date  . Gallbladder surgery    . Breast surgery    . Appendectomy    . Abdominal hysterectomy    . Cardiac catheterization  2006    [place] Smoker.     History   Social History  . Marital Status: Married    Spouse Name: N/A    Number of Children: N/A  . Years of Education: N/A   Occupational History  . Not on file.   Social History Main Topics  . Smoking status: Never Smoker   . Smokeless tobacco: Never Used  . Alcohol Use: No  . Drug Use: No  . Sexual Activity: Not on file   Other Topics Concern  . Not on file   Social History Narrative  . No narrative on file    Current Outpatient Prescriptions on File Prior to Visit  Medication Sig Dispense Refill  . amLODipine (NORVASC) 5 MG tablet TAKE 1 TABLET (5 MG TOTAL) BY MOUTH DAILY.  30 tablet  5  . atorvastatin (LIPITOR) 40 MG tablet TAKE 1 TABLET (40 MG TOTAL) BY MOUTH DAILY.  30 tablet  0  . B-D UF III MINI PEN NEEDLES 31G X 5 MM MISC       . furosemide (LASIX) 20 MG tablet TAKE 1/2 TABLET (10 MG TOTAL)  BY MOUTH DAILY.  30 tablet  5  . guaiFENesin-codeine 100-10 MG/5ML syrup Take 5 mLs by mouth 3 (three) times daily as needed for cough.  120 mL  0  . Insulin Aspart Prot & Aspart (NOVOLOG MIX 70/30 FLEXPEN) (70-30) 100 UNIT/ML SUPN Inject 50-100 Units into the skin 2 (two) times daily. 100 units with breakfast, and 50 units with the evening meal, and pen needles 2/day.  20 pen  11  . metoprolol succinate (TOPROL-XL) 100 MG 24 hr tablet TAKE 1 TABLET BY MOUTH DAILY. TAKE WITH OR IMMEDIATELY FOLLOWING A MEAL.  30 tablet  0  . potassium chloride SA (KLOR-CON M20) 20 MEQ tablet TAKE 1 TABLET (20 MEQ TOTAL) BY MOUTH DAILY.  30 tablet  3   No current facility-administered medications on file prior to visit.    Allergies  Allergen Reactions  . Altace [Ramipril]   . Aspirin     Family History  Problem Relation Age of Onset  . Arthritis Mother   . Hyperlipidemia Mother   . Stroke Mother   . Hypertension Mother   . Diabetes Mother   . Arthritis Father   .  Hyperlipidemia Father   . Stroke Father   . Hypertension Father   . Diabetes Father    BP 126/70  Pulse 80  Wt 205 lb (92.987 kg)  BMI 33.1 kg/m2  SpO2 97%  Review of Systems denies hypoglycemia and weight change.    Objective:   Physical Exam VITAL SIGNS:  See vs page GENERAL: no distress. SKIN:  Insulin injection sites at the anterior abdomen are normal, except for a few ecchymoses  Lab Results  Component Value Date   HGBA1C 10.5* 03/22/2013      Assessment & Plan:  DM: This new insulin regimen was chosen from multiple options, for its simplicity.  The benefits of glycemic control must be weighed against the risks of hypoglycemia.   CAD: in this setting, she should avoid hypoglycemia.

## 2013-03-22 NOTE — Patient Instructions (Addendum)
check your blood sugar twice a day.  vary the time of day when you check, between before the 3 meals, and at bedtime.  also check if you have symptoms of your blood sugar being too high or too low.  please keep a record of the readings and bring it to your next appointment here.  please call us sooner if your blood sugar goes below 70, or if you have a lot of readings over 200.   blood tests are being requested for you today.  We'll contact you with results.   Based on the results, i may say you should change to the twice a day insulin.     Please come back for a follow-up appointment in 3 weeks.

## 2013-03-23 ENCOUNTER — Ambulatory Visit: Payer: Medicare Other | Admitting: Internal Medicine

## 2013-03-29 ENCOUNTER — Ambulatory Visit (INDEPENDENT_AMBULATORY_CARE_PROVIDER_SITE_OTHER): Payer: Medicare Other | Admitting: Family Medicine

## 2013-03-29 ENCOUNTER — Encounter: Payer: Self-pay | Admitting: Family Medicine

## 2013-03-29 VITALS — BP 180/80 | HR 64 | Temp 98.3°F | Ht 66.0 in | Wt 203.0 lb

## 2013-03-29 DIAGNOSIS — K219 Gastro-esophageal reflux disease without esophagitis: Secondary | ICD-10-CM

## 2013-03-29 DIAGNOSIS — IMO0002 Reserved for concepts with insufficient information to code with codable children: Secondary | ICD-10-CM

## 2013-03-29 DIAGNOSIS — I2510 Atherosclerotic heart disease of native coronary artery without angina pectoris: Secondary | ICD-10-CM

## 2013-03-29 DIAGNOSIS — E1142 Type 2 diabetes mellitus with diabetic polyneuropathy: Secondary | ICD-10-CM | POA: Diagnosis not present

## 2013-03-29 DIAGNOSIS — I10 Essential (primary) hypertension: Secondary | ICD-10-CM | POA: Diagnosis not present

## 2013-03-29 DIAGNOSIS — E1149 Type 2 diabetes mellitus with other diabetic neurological complication: Secondary | ICD-10-CM

## 2013-03-29 DIAGNOSIS — E785 Hyperlipidemia, unspecified: Secondary | ICD-10-CM

## 2013-03-29 DIAGNOSIS — E1140 Type 2 diabetes mellitus with diabetic neuropathy, unspecified: Secondary | ICD-10-CM

## 2013-03-29 MED ORDER — OMEPRAZOLE 40 MG PO CPDR: 40.0000 mg | DELAYED_RELEASE_CAPSULE | Freq: Every day | ORAL | Status: DC | PRN

## 2013-03-29 MED ORDER — AMLODIPINE BESYLATE 10 MG PO TABS: ORAL_TABLET | ORAL | Status: DC

## 2013-03-29 NOTE — Assessment & Plan Note (Signed)
Chronic, ucontrolled Foot exam today consistent with R neuropathy.  UTD eye exam and pneumovax. Confusion about insulin regimen currently.  Reviewed with patient - I believe plan was to be on novolog 70/30 100u at breakfast and 50u at dinner, but I will verify this with Dr. Everardo All prior to recommendiing she start this regimen.  See pt instructions. Currently on lantus 20 u nightly and novolog aspart 45 u breakfast and lung, 55u dinner. Lab Results  Component Value Date   HGBA1C 10.5* 03/22/2013

## 2013-03-29 NOTE — Assessment & Plan Note (Signed)
Intolerant of aspirin. Recent stress test 2014 WNL.

## 2013-03-29 NOTE — Assessment & Plan Note (Signed)
uncontrolled today, persistently so on recheck. Increase amlodipine to 10mg  daily.  Continue metoprolol succ 100mg   If persistentl poor control, add ACEI (intolerance to ramipril in past 2/2 GI upset) RTC 1 mo for f/u.

## 2013-03-29 NOTE — Assessment & Plan Note (Signed)
Start omeprazole 40mg  daily prn.

## 2013-03-29 NOTE — Progress Notes (Addendum)
Subjective:    Patient ID: Julia Banks, female    DOB: 03/28/1941, 72 y.o.   MRN: [id number]  HPI CC: transfer of care from Dr. Darrick Huntsman  Pleasant 72yo with h/o DM, OSA on CPAP, HTN, and CAD presents to establish care (closer to home).  T2DM - followed by endo Dr. Everardo All.  Last saw last week.  Confusion about current regimen - has 70/30 at home, also has lantus and novolog flexpen.  Has been taking lantus and what sounds like old novolog aspart flex pen, not 70/30.  Last eye exam - 05/2012.  Foot exam 08/2012.  No low sugars or hypoglycemic sxs.  Lowest cbg 72.  Significant # >200.  Checks sugars bid per endo, alternating times throughout the day.  No paresthesias of extremities.  --> actually endorses R foot paresthesias on exam. Lab Results  Component Value Date   HGBA1C 10.5* 03/22/2013    HTN - No HA, vision changes, CP/tightness, SOB, leg swelling. Compliant with amlodipine 5mg  and toprol xl 100mg  daily. States at home bp runs 160/70s.  HLD - compliant with lipitor 40mg  nightly.  CAD - stress test done this year 2/2 chest pain, stable ([place]).  GERD - off meds for this.  Worsening water brash recently.    Preventative: Colonoscopy 05/2010 No recent mammogram Last tetanus ? Flu shot - declines   Medications and allergies reviewed and updated in chart.  Past histories reviewed and updated if relevant as below. Patient Active Problem List   Diagnosis Date Noted  . Cough 12/20/2012  . Essential hypertension, benign 09/17/2012  . Other and unspecified hyperlipidemia 09/17/2012  . Diabetes mellitus type II, uncontrolled 02/15/2012  . OSA on CPAP 02/13/2012  . Coronary artery disease 02/13/2012   Past Medical History  Diagnosis Date  . Asthma   . Arthritis   . Headaches, cluster   . Diabetes type 2, uncontrolled   . Colon polyps     benign  . Hyperlipidemia   . Hypertension   . GERD (gastroesophageal reflux disease)   . Sleep apnea     on CPAP  . Coronary artery  disease     30% stenosis reported on previous cardiac catheterization in 2006. This was done in [facility]   Past Surgical History  Procedure Laterality Date  . Gallbladder surgery    . Breast biopsy      right; benign  . Appendectomy    . Abdominal hysterectomy      ovaries remain  . Cardiac catheterization  2006    [place], [place].   . Cardiovascular stress test  09/2012    WNL [facility])   History  Substance Use Topics  . Smoking status: Never Smoker   . Smokeless tobacco: Never Used  . Alcohol Use: No   Family History  Problem Relation Age of Onset  . Arthritis Mother   . Hyperlipidemia Mother   . Stroke Mother   . Hypertension Mother   . Diabetes Mother   . Arthritis Father   . Hyperlipidemia Father   . Stroke Father   . Hypertension Father   . Diabetes Father    Allergies  Allergen Reactions  . Altace [Ramipril] Other (See Comments)    Upset stomach  . Aspirin   . Metformin And Related Other (See Comments)    GI upset   Current Outpatient Prescriptions on File Prior to Visit  Medication Sig Dispense Refill  . amLODipine (NORVASC) 5 MG tablet TAKE 1 TABLET (5 MG TOTAL) BY  MOUTH DAILY.  30 tablet  5  . B-D UF III MINI PEN NEEDLES 31G X 5 MM MISC       . furosemide (LASIX) 20 MG tablet TAKE 1/2 TABLET (10 MG TOTAL) BY MOUTH DAILY.  30 tablet  5  . glucose blood test strip 1 each by Other route 4 (four) times daily. And lancets 4/day 250.01      . Insulin Aspart Prot & Aspart (NOVOLOG MIX 70/30 FLEXPEN) (70-30) 100 UNIT/ML SUPN Inject 50-100 Units into the skin 2 (two) times daily. 100 units with breakfast, and 50 units with the evening meal, and pen needles 2/day.  20 pen  11  . metoprolol succinate (TOPROL-XL) 100 MG 24 hr tablet TAKE 1 TABLET BY MOUTH DAILY. TAKE WITH OR IMMEDIATELY FOLLOWING A MEAL.  30 tablet  0  . potassium chloride SA (KLOR-CON M20) 20 MEQ tablet TAKE 1 TABLET (20 MEQ TOTAL) BY MOUTH DAILY.  30 tablet  3   No current  facility-administered medications on file prior to visit.     Review of Systems Per HPI    Objective:   Physical Exam  Nursing note and vitals reviewed. Constitutional: She appears well-developed and well-nourished. No distress.  HENT:  Head: Normocephalic and atraumatic.  Right Ear: Hearing, tympanic membrane, external ear and ear canal normal.  Left Ear: Hearing, tympanic membrane, external ear and ear canal normal.  Nose: Nose normal.  Mouth/Throat: Oropharynx is clear and moist. No oropharyngeal exudate.  Eyes: Conjunctivae and EOM are normal. Pupils are equal, round, and reactive to light. No scleral icterus.  Neck: Normal range of motion. Neck supple. Carotid bruit is not present.  Cardiovascular: Normal rate, regular rhythm, normal heart sounds and intact distal pulses.   No murmur heard. Pulmonary/Chest: Effort normal and breath sounds normal. No respiratory distress. She has no wheezes. She has no rales.  Musculoskeletal: She exhibits no edema.  Diabetic foot exam: Normal inspection No skin breakdown No calluses  Normal DP/PT pulses Decreased sensation to light touch and monofilament on right Nails normal  Lymphadenopathy:    She has no cervical adenopathy.  Skin: Skin is warm and dry. No rash noted.  Psychiatric: She has a normal mood and affect.       Assessment & Plan:

## 2013-03-29 NOTE — Patient Instructions (Addendum)
Your blood pressure is too high!  Increase amlodipine to 2 pills daily until you run out, then pick up new script (for 10mg  daily of amlodipine). For heart burn - use omeprazole 40mg  daily as needed. Insulin regimen should be 100 units of 70/30 in am with breakfast and 50 units of 70/30 in pm with dinner - but hold off on this until you hear from me as I'm going to verify this dose with Dr. Everardo All. Return in 1 month for blood pressure and diabetes follow up.

## 2013-03-29 NOTE — Assessment & Plan Note (Signed)
continue lipitor 40mg  daily. Lab Results  Component Value Date   LDLCALC 76 02/25/2012  may be due for FLP.

## 2013-03-30 ENCOUNTER — Telehealth: Payer: Self-pay | Admitting: Family Medicine

## 2013-03-30 ENCOUNTER — Encounter: Payer: Self-pay | Admitting: Family Medicine

## 2013-03-30 NOTE — Telephone Encounter (Signed)
Patient notified

## 2013-03-30 NOTE — Telephone Encounter (Signed)
plz notify I touched base with Dr. Everardo All and would like her to start novolog 70/30 100u with breakfast and 50u at dinner. Keep f/u appt with endo in 2 weeks. Stop all other insulin.

## 2013-04-06 ENCOUNTER — Other Ambulatory Visit: Payer: Self-pay | Admitting: Internal Medicine

## 2013-04-07 ENCOUNTER — Encounter: Payer: Self-pay | Admitting: *Deleted

## 2013-04-08 ENCOUNTER — Other Ambulatory Visit: Payer: Self-pay | Admitting: *Deleted

## 2013-04-12 ENCOUNTER — Other Ambulatory Visit: Payer: Self-pay | Admitting: *Deleted

## 2013-04-12 ENCOUNTER — Encounter: Payer: Self-pay | Admitting: Family Medicine

## 2013-04-12 ENCOUNTER — Ambulatory Visit (INDEPENDENT_AMBULATORY_CARE_PROVIDER_SITE_OTHER): Payer: Medicare Other | Admitting: Family Medicine

## 2013-04-12 VITALS — BP 152/78 | HR 68 | Temp 98.2°F | Wt 204.8 lb

## 2013-04-12 DIAGNOSIS — I10 Essential (primary) hypertension: Secondary | ICD-10-CM | POA: Diagnosis not present

## 2013-04-12 MED ORDER — METOPROLOL SUCCINATE ER 100 MG PO TB24: ORAL_TABLET | ORAL | Status: DC

## 2013-04-12 MED ORDER — LOSARTAN POTASSIUM 25 MG PO TABS: 25.0000 mg | ORAL_TABLET | Freq: Every day | ORAL | Status: DC

## 2013-04-12 MED ORDER — ATORVASTATIN CALCIUM 40 MG PO TABS: 40.0000 mg | ORAL_TABLET | Freq: Every day | ORAL | Status: DC

## 2013-04-12 NOTE — Patient Instructions (Signed)
Blood pressure looking better!  Let's start losartan 25mg  daily - I've sent this into the pharmacy. Return on Friday for recheck kidney function (lab visit). Call your insurance about the shingles shot to see if it is covered or how much it would cost and where is cheaper (here or pharmacy).  If you want to receive here, call for nurse visit.

## 2013-04-12 NOTE — Assessment & Plan Note (Signed)
Improved control, still a bit above goal - start low dose losartan and return on Friday for recheck creatinine (pt leaving next week for 6 wk trip). rtc 3 mo for f/u.

## 2013-04-12 NOTE — Telephone Encounter (Signed)
His patient in now seeing Dr. Sharen Hones

## 2013-04-12 NOTE — Progress Notes (Signed)
  Subjective:    Patient ID: Julia Banks, female    DOB: 1941/05/28, 72 y.o.   MRN: [id number]  HPI CC: recheck HTN  Mrs .Banks presents today with her husband for bp recheck.  HTN - No vision changes, dizziness, CP/tightness, leg swelling. Occasional HA. States at home bp runs 160/70s.  Last visit found to have BP 180/80, amlodipine increased to 10mg  daily. Continue toprol xl 100mg  daily.  H/o intolerance to ramipril in past - GI upset.  Brings log of blood pressures - 120-160/40-80  Has appt with endo Dr. Everardo All tomorrow. Brings log of sugars - improving slowly, but still most 200s (not 400s anymore)  GERD - improving on omeprazole.  Lab Results  Component Value Date   CREATININE 1.35 03/22/2013    Past Medical History  Diagnosis Date  . Asthma   . Arthritis   . Headaches, cluster   . Diabetes type 2, uncontrolled   . Colon polyps     benign  . Hyperlipidemia   . Hypertension   . GERD (gastroesophageal reflux disease)   . Sleep apnea     on CPAP  . Coronary artery disease     30% stenosis reported on previous cardiac catheterization in 2006. This was done in [facility]     Review of Systems Per HPI    Objective:   Physical Exam  Nursing note and vitals reviewed. Constitutional: She appears well-developed and well-nourished. No distress.  HENT:  Head: Normocephalic and atraumatic.  Mouth/Throat: Oropharynx is clear and moist. No oropharyngeal exudate.  Eyes: Conjunctivae and EOM are normal. Pupils are equal, round, and reactive to light. No scleral icterus.  Cardiovascular: Normal rate, regular rhythm, normal heart sounds and intact distal pulses.   No murmur heard. Pulmonary/Chest: Effort normal and breath sounds normal. No respiratory distress. She has no wheezes. She has no rales.  Musculoskeletal: She exhibits no edema.       Assessment & Plan:

## 2013-04-13 ENCOUNTER — Encounter: Payer: Self-pay | Admitting: Endocrinology

## 2013-04-13 ENCOUNTER — Ambulatory Visit (INDEPENDENT_AMBULATORY_CARE_PROVIDER_SITE_OTHER): Payer: Medicare Other | Admitting: Endocrinology

## 2013-04-13 VITALS — BP 124/74 | HR 69 | Wt 207.0 lb

## 2013-04-13 DIAGNOSIS — E039 Hypothyroidism, unspecified: Secondary | ICD-10-CM | POA: Insufficient documentation

## 2013-04-13 MED ORDER — INSULIN LISPRO PROT & LISPRO (50-50 MIX) 100 UNIT/ML KWIKPEN: 110.0000 [IU] | PEN_INJECTOR | Freq: Every day | SUBCUTANEOUS | Status: DC

## 2013-04-13 NOTE — Patient Instructions (Addendum)
check your blood sugar twice a day.  vary the time of day when you check, between before the 3 meals, and at bedtime.  also check if you have symptoms of your blood sugar being too high or too low.  please keep a record of the readings and bring it to your next appointment here.  please call us sooner if your blood sugar goes below 70, or if you have a lot of readings over 200.   Please increase the insulin to 110 units with breakfast, and 70 with the evening meal. When you run out of this insulin, please change to "50/50," at the same dosage.  Please come back for a follow-up appointment in 6 weeks.

## 2013-04-13 NOTE — Progress Notes (Signed)
Subjective:    Patient ID: Julia Banks, female    DOB: [birth year], 72 y.o.   MRN: [id number]  HPI Pt returns for f/u of insulin-requiring DM (dx'ed 1994; complicated by moderate sensory neuropathy of the lower extremities; she has associated CAD; she has never had severe hypoglycemia or DKA).   she brings a record of her cbg's which i have reviewed today.  It varies from 124-306.  It is highest at hs, and lowest in the afternoon.  pt states she feels well in general. Past Medical History  Diagnosis Date  . Asthma   . Arthritis   . Headaches, cluster   . Diabetes type 2, uncontrolled   . Colon polyps     benign  . Hyperlipidemia   . Hypertension   . GERD (gastroesophageal reflux disease)   . Sleep apnea     on CPAP  . Coronary artery disease     30% stenosis reported on previous cardiac catheterization in 2006. This was done in [facility]    Past Surgical History  Procedure Laterality Date  . Gallbladder surgery    . Breast biopsy      right; benign  . Appendectomy    . Abdominal hysterectomy      ovaries remain  . Cardiac catheterization  2006    [place], [place].   . Cardiovascular stress test  09/2012    [facility])    History   Social History  . Marital Status: Married    Spouse Name: N/A    Number of Children: N/A  . Years of Education: N/A   Occupational History  . Not on file.   Social History Main Topics  . Smoking status: Never Smoker   . Smokeless tobacco: Never Used     Comment: second hand exposure  . Alcohol Use: No  . Drug Use: No  . Sexual Activity: Not on file   Other Topics Concern  . Not on file   Social History Narrative   Lives with husband.  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 amLODipine (NORVASC) 10 MG tablet TAKE 1  TABLET (10 MG TOTAL) BY MOUTH DAILY.  30 tablet  11  . atorvastatin (LIPITOR) 40 MG tablet Take 1 tablet (40 mg total) by mouth daily.  30 tablet  3  . B-D UF III MINI PEN NEEDLES 31G X 5 MM MISC       . furosemide (LASIX) 20 MG tablet TAKE 1/2 TABLET (10 MG TOTAL) BY MOUTH DAILY.  30 tablet  5  . glucose blood test strip 1 each by Other route 4 (four) times daily. And lancets 4/day 250.01      . Insulin Aspart Prot & Aspart (NOVOLOG MIX 70/30 FLEXPEN) (70-30) 100 UNIT/ML SUPN Inject 50-100 Units into the skin 2 (two) times daily. 100 units with breakfast, and 50 units with the evening meal, and pen needles 2/day.  20 pen  11  . losartan (COZAAR) 25 MG tablet Take 1 tablet (25 mg total) by mouth daily.  30 tablet  6  . metoprolol succinate (TOPROL-XL) 100 MG 24 hr tablet TAKE 1 TABLET BY MOUTH DAILY. TAKE WITH OR IMMEDIATELY FOLLOWING A MEAL.  30 tablet  3  . omeprazole (PRILOSEC) 40 MG capsule  Take 1 capsule (40 mg total) by mouth daily as needed.  30 capsule  3  . potassium chloride SA (KLOR-CON M20) 20 MEQ tablet TAKE 1 TABLET (20 MEQ TOTAL) BY MOUTH DAILY.  30 tablet  3   No current facility-administered medications on file prior to visit.    Allergies  Allergen Reactions  . Altace [Ramipril] Other (See Comments)    Upset stomach  . Aspirin   . Metformin And Related Other (See Comments)    GI upset    Family History  Problem Relation Age of Onset  . Arthritis Mother   . Hyperlipidemia Mother   . Stroke Mother   . Hypertension Mother   . Diabetes Mother   . Arthritis Father   . Hyperlipidemia Father   . CAD Father     MI  . Hypertension Father   . Cancer Neg Hx    BP 124/74  Pulse 69  Wt 207 lb (93.895 kg)  BMI 33.43 kg/m2  SpO2 95%  Review of Systems denies hypoglycemia.  She has gained weight.    Objective:   Physical Exam VITAL SIGNS:  See vs page GENERAL: no distress  Lab Results  Component Value Date   HGBA1C 10.5* 03/22/2013  outside test results are  reviewed: TSH=5.4    Assessment & Plan:  Hypothyroidism, mild, new.  We'll follow TSH. DM: This new insulin regimen was chosen from multiple options, for its simplicity.  The benefits of glycemic control must be weighed against the risks of hypoglycemia.   CAD: in this setting, she should avoid hypoglycemia.

## 2013-04-14 ENCOUNTER — Other Ambulatory Visit: Payer: Self-pay | Admitting: Obstetrics and Gynecology

## 2013-04-14 DIAGNOSIS — R928 Other abnormal and inconclusive findings on diagnostic imaging of breast: Secondary | ICD-10-CM

## 2013-04-15 ENCOUNTER — Other Ambulatory Visit (INDEPENDENT_AMBULATORY_CARE_PROVIDER_SITE_OTHER): Payer: Medicare Other

## 2013-04-15 ENCOUNTER — Encounter: Payer: Self-pay | Admitting: Family Medicine

## 2013-04-15 ENCOUNTER — Encounter: Payer: Self-pay | Admitting: Endocrinology

## 2013-04-15 DIAGNOSIS — I10 Essential (primary) hypertension: Secondary | ICD-10-CM | POA: Diagnosis not present

## 2013-04-15 DIAGNOSIS — E1122 Type 2 diabetes mellitus with diabetic chronic kidney disease: Secondary | ICD-10-CM

## 2013-04-15 LAB — BASIC METABOLIC PANEL
BUN: 17 mg/dL (ref 6–23)
Chloride: 103 mEq/L (ref 96–112)
GFR: 40.84 mL/min — ABNORMAL LOW (ref >60.00)
Glucose, Bld: 251 mg/dL — ABNORMAL HIGH (ref 70–99)
Potassium: 4.3 mEq/L (ref 3.5–5.1)
Sodium: 137 mEq/L (ref 135–145)

## 2013-04-16 ENCOUNTER — Encounter: Payer: Self-pay | Admitting: Family Medicine

## 2013-04-16 DIAGNOSIS — E1122 Type 2 diabetes mellitus with diabetic chronic kidney disease: Secondary | ICD-10-CM | POA: Insufficient documentation

## 2013-04-16 MED ORDER — LEVOTHYROXINE SODIUM 25 MCG PO TABS: 25.0000 ug | ORAL_TABLET | Freq: Every day | ORAL | Status: DC

## 2013-05-05 ENCOUNTER — Other Ambulatory Visit: Payer: Self-pay

## 2013-05-31 DIAGNOSIS — M722 Plantar fascial fibromatosis: Secondary | ICD-10-CM | POA: Diagnosis not present

## 2013-05-31 DIAGNOSIS — L851 Acquired keratosis [keratoderma] palmaris et plantaris: Secondary | ICD-10-CM | POA: Diagnosis not present

## 2013-05-31 DIAGNOSIS — IMO0001 Reserved for inherently not codable concepts without codable children: Secondary | ICD-10-CM | POA: Diagnosis not present

## 2013-05-31 DIAGNOSIS — E1149 Type 2 diabetes mellitus with other diabetic neurological complication: Secondary | ICD-10-CM | POA: Diagnosis not present

## 2013-05-31 DIAGNOSIS — B351 Tinea unguium: Secondary | ICD-10-CM | POA: Diagnosis not present

## 2013-05-31 DIAGNOSIS — G909 Disorder of the autonomic nervous system, unspecified: Secondary | ICD-10-CM | POA: Diagnosis not present

## 2013-06-02 ENCOUNTER — Ambulatory Visit
Admission: RE | Admit: 2013-06-02 | Discharge: 2013-06-02 | Disposition: A | Discharge status: Home / Self Care | Payer: Medicare Other | Source: Ambulatory Visit | Attending: Obstetrics and Gynecology | Admitting: Obstetrics and Gynecology

## 2013-06-02 DIAGNOSIS — R928 Other abnormal and inconclusive findings on diagnostic imaging of breast: Secondary | ICD-10-CM | POA: Diagnosis not present

## 2013-06-09 ENCOUNTER — Ambulatory Visit: Payer: Medicare Other | Admitting: Endocrinology

## 2013-06-12 LAB — HM DIABETES EYE EXAM: HM Diabetic Eye Exam: NORMAL

## 2013-06-13 DIAGNOSIS — E119 Type 2 diabetes mellitus without complications: Secondary | ICD-10-CM | POA: Diagnosis not present

## 2013-06-13 DIAGNOSIS — H5231 Anisometropia: Secondary | ICD-10-CM | POA: Diagnosis not present

## 2013-06-13 DIAGNOSIS — H524 Presbyopia: Secondary | ICD-10-CM | POA: Diagnosis not present

## 2013-06-13 DIAGNOSIS — H35039 Hypertensive retinopathy, unspecified eye: Secondary | ICD-10-CM | POA: Diagnosis not present

## 2013-06-17 ENCOUNTER — Other Ambulatory Visit: Payer: Self-pay | Admitting: Family Medicine

## 2013-06-17 DIAGNOSIS — E1140 Type 2 diabetes mellitus with diabetic neuropathy, unspecified: Secondary | ICD-10-CM

## 2013-06-17 DIAGNOSIS — N183 Chronic kidney disease, stage 3 unspecified: Secondary | ICD-10-CM

## 2013-06-17 DIAGNOSIS — E039 Hypothyroidism, unspecified: Secondary | ICD-10-CM

## 2013-06-17 DIAGNOSIS — IMO0002 Reserved for concepts with insufficient information to code with codable children: Secondary | ICD-10-CM

## 2013-06-17 DIAGNOSIS — E785 Hyperlipidemia, unspecified: Secondary | ICD-10-CM

## 2013-06-17 DIAGNOSIS — E1122 Type 2 diabetes mellitus with diabetic chronic kidney disease: Secondary | ICD-10-CM

## 2013-06-17 DIAGNOSIS — I10 Essential (primary) hypertension: Secondary | ICD-10-CM

## 2013-06-19 ENCOUNTER — Encounter: Payer: Self-pay | Admitting: Family Medicine

## 2013-06-21 ENCOUNTER — Other Ambulatory Visit (INDEPENDENT_AMBULATORY_CARE_PROVIDER_SITE_OTHER): Payer: Medicare Other

## 2013-06-21 ENCOUNTER — Other Ambulatory Visit: Payer: Medicare Other

## 2013-06-21 DIAGNOSIS — E785 Hyperlipidemia, unspecified: Secondary | ICD-10-CM

## 2013-06-21 DIAGNOSIS — E1129 Type 2 diabetes mellitus with other diabetic kidney complication: Secondary | ICD-10-CM | POA: Diagnosis not present

## 2013-06-21 DIAGNOSIS — IMO0002 Reserved for concepts with insufficient information to code with codable children: Secondary | ICD-10-CM

## 2013-06-21 DIAGNOSIS — N183 Chronic kidney disease, stage 3 unspecified: Secondary | ICD-10-CM

## 2013-06-21 DIAGNOSIS — E1149 Type 2 diabetes mellitus with other diabetic neurological complication: Secondary | ICD-10-CM

## 2013-06-21 DIAGNOSIS — E1122 Type 2 diabetes mellitus with diabetic chronic kidney disease: Secondary | ICD-10-CM

## 2013-06-21 DIAGNOSIS — E039 Hypothyroidism, unspecified: Secondary | ICD-10-CM | POA: Diagnosis not present

## 2013-06-21 DIAGNOSIS — E1140 Type 2 diabetes mellitus with diabetic neuropathy, unspecified: Secondary | ICD-10-CM

## 2013-06-21 DIAGNOSIS — E1142 Type 2 diabetes mellitus with diabetic polyneuropathy: Secondary | ICD-10-CM

## 2013-06-21 LAB — LIPID PANEL
Cholesterol: 138 mg/dL (ref 0–200)
HDL: 30.2 mg/dL — ABNORMAL LOW (ref >39.00)
LDL Cholesterol: 70 mg/dL (ref 0–99)
Triglycerides: 188 mg/dL — ABNORMAL HIGH (ref 0.0–149.0)
VLDL: 37.6 mg/dL (ref 0.0–40.0)

## 2013-06-21 LAB — RENAL FUNCTION PANEL
Albumin: 4.1 g/dL (ref 3.5–5.2)
BUN: 12 mg/dL (ref 6–23)
CO2: 28 mEq/L (ref 19–32)
Calcium: 9 mg/dL (ref 8.4–10.5)
Chloride: 103 mEq/L (ref 96–112)
Creatinine, Ser: 1.3 mg/dL — ABNORMAL HIGH (ref 0.4–1.2)

## 2013-06-21 LAB — TSH: TSH: 4.34 u[IU]/mL (ref 0.35–5.50)

## 2013-06-28 ENCOUNTER — Encounter: Payer: Self-pay | Admitting: Internal Medicine

## 2013-06-28 ENCOUNTER — Encounter: Payer: Self-pay | Admitting: Family Medicine

## 2013-06-28 ENCOUNTER — Ambulatory Visit (INDEPENDENT_AMBULATORY_CARE_PROVIDER_SITE_OTHER): Payer: Medicare Other | Admitting: Family Medicine

## 2013-06-28 VITALS — BP 150/64 | HR 68 | Temp 98.3°F | Ht 66.0 in | Wt 210.5 lb

## 2013-06-28 DIAGNOSIS — J069 Acute upper respiratory infection, unspecified: Secondary | ICD-10-CM | POA: Diagnosis not present

## 2013-06-28 DIAGNOSIS — I10 Essential (primary) hypertension: Secondary | ICD-10-CM

## 2013-06-28 DIAGNOSIS — E785 Hyperlipidemia, unspecified: Secondary | ICD-10-CM | POA: Diagnosis not present

## 2013-06-28 DIAGNOSIS — E039 Hypothyroidism, unspecified: Secondary | ICD-10-CM

## 2013-06-28 DIAGNOSIS — N183 Chronic kidney disease, stage 3 unspecified: Secondary | ICD-10-CM

## 2013-06-28 DIAGNOSIS — Z0000 Encounter for general adult medical examination without abnormal findings: Secondary | ICD-10-CM

## 2013-06-28 DIAGNOSIS — E1142 Type 2 diabetes mellitus with diabetic polyneuropathy: Secondary | ICD-10-CM

## 2013-06-28 DIAGNOSIS — E1129 Type 2 diabetes mellitus with other diabetic kidney complication: Secondary | ICD-10-CM

## 2013-06-28 DIAGNOSIS — Z860100 Personal history of colon polyps, unspecified: Secondary | ICD-10-CM

## 2013-06-28 DIAGNOSIS — IMO0002 Reserved for concepts with insufficient information to code with codable children: Secondary | ICD-10-CM

## 2013-06-28 DIAGNOSIS — E1149 Type 2 diabetes mellitus with other diabetic neurological complication: Secondary | ICD-10-CM

## 2013-06-28 DIAGNOSIS — E1140 Type 2 diabetes mellitus with diabetic neuropathy, unspecified: Secondary | ICD-10-CM

## 2013-06-28 DIAGNOSIS — E1122 Type 2 diabetes mellitus with diabetic chronic kidney disease: Secondary | ICD-10-CM

## 2013-06-28 MED ORDER — ZOSTER VACCINE LIVE 19400 UNT/0.65ML ~~LOC~~ SOLR: 0.6500 mL | Freq: Once | SUBCUTANEOUS | Status: DC

## 2013-06-28 MED ORDER — LOSARTAN POTASSIUM 50 MG PO TABS: 50.0000 mg | ORAL_TABLET | Freq: Every day | ORAL | Status: DC

## 2013-06-28 MED ORDER — AZITHROMYCIN 250 MG PO TABS: ORAL_TABLET | ORAL | Status: DC

## 2013-06-28 MED ORDER — GUAIFENESIN-CODEINE 100-10 MG/5ML PO SYRP: 5.0000 mL | ORAL_SOLUTION | Freq: Two times a day (BID) | ORAL | Status: DC | PRN

## 2013-06-28 NOTE — Assessment & Plan Note (Signed)
Chronic, stable. Continue lipitor 40mg daily 

## 2013-06-28 NOTE — Assessment & Plan Note (Signed)
Stable. Continue levothyroxine dose.

## 2013-06-28 NOTE — Assessment & Plan Note (Signed)
Reviewed new dx CKD stage 3 and new bp goal <130/80. Increase losartan to 50mg  daily.  Pt agrees with plan.

## 2013-06-28 NOTE — Assessment & Plan Note (Signed)
Anticipate viral given short duration. Sugar free cheratussin for cough at night. Given comorbidities, will provide with WASP for zpack. Reviewed with pt reasons to fill, pt agrees.

## 2013-06-28 NOTE — Assessment & Plan Note (Signed)
rec f/u with endo.

## 2013-06-28 NOTE — Assessment & Plan Note (Signed)
Chronic, uncontrolled. Increase losartan to 50mg  daily.

## 2013-06-28 NOTE — Progress Notes (Signed)
Subjective:    Patient ID: Julia Banks, female    DOB: [birth year], 72 y.o.   MRN: [id number]  HPI CC: medicare wellness visit  BP elevated - lost blood pressure cuff but found again this morning.  URI sxs - started coughing 4d ago - cough with headache.  Cough keeping her awake.  Cough productive of yellow sputum.  No fevers/chills, ear or tooth pain, sore throat or PNDrainage.  No wheezing or dyspnea.  + sick contacts at home.  H/o mild asthma.  Failed hearing screen today.  Denies trouble with hearing.  Wonders if related to current URI Passes vision screen 1 fall in last year - tripped on rock and skinned elbow.  No premonitory sxs. Denies depression/anhedonia, sadness.  Preventative:  Colonoscopy 05/2010 in [place] (Dr. Asencion Islam?) - polyps found.  rec return in 2 years.  Overdue for this.  Requests referral back to colonoscopy.  No records of this. S/p hysterectomy for benign reason.  Last well woman exam with Dr. Vincente Poli 03/2013.  mammogram abnormal this year - with rpt imaging (05/2013) R diagnostic mammogram with probably benign findings.  rec rpt in 6 months R breast diagnostic mammo. dexa - 3-4 yrs ago.  "off a little bit".  Osteopenia. Tetanus - unsure Flu shot - declines prevnar 2006, pneumovax 2014 Shingles shot - may receive at local pharmacy. Advanced directives: has at home.  HC proxy would be niece Marcelino Duster Flinchum  Medications and allergies reviewed and updated in chart.  Past histories reviewed and updated if relevant as below. Patient Active Problem List   Diagnosis Date Noted  . Diabetes mellitus with stage 3 chronic kidney disease   . Unspecified hypothyroidism 04/13/2013  . GERD (gastroesophageal reflux disease)   . Essential hypertension, benign 09/17/2012  . HLD (hyperlipidemia) 09/17/2012  . Type 2 diabetes, uncontrolled, with neuropathy 02/15/2012  . OSA on CPAP 02/13/2012  . Coronary artery disease 02/13/2012   Past Medical History  Diagnosis  Date  . Asthma   . Arthritis   . Headaches, cluster   . Type 2 diabetes, uncontrolled, with neuropathy   . Colon polyps     benign  . Hyperlipidemia   . Hypertension   . GERD (gastroesophageal reflux disease)   . Sleep apnea     on CPAP  . Coronary artery disease     30% stenosis reported on previous cardiac catheterization in 2006. This was done in [facility]  . Chronic kidney disease, stage 3    Past Surgical History  Procedure Laterality Date  . Gallbladder surgery    . Breast biopsy      right; benign  . Appendectomy    . Abdominal hysterectomy      ovaries remain  . Cardiac catheterization  2006    [place], [place].   . Cardiovascular stress test  09/2012    [facility])   History  Substance Use Topics  . Smoking status: Never Smoker   . Smokeless tobacco: Never Used     Comment: second hand exposure  . Alcohol Use: No   Family History  Problem Relation Age of Onset  . Arthritis Mother   . Hyperlipidemia Mother   . Stroke Mother   . Hypertension Mother   . Diabetes Mother   . Arthritis Father   . Hyperlipidemia Father   . CAD Father     MI  . Hypertension Father   . Cancer Neg Hx    Allergies  Allergen Reactions  . Altace [  Ramipril] Other (See Comments)    Upset stomach  . Aspirin   . Metformin And Related Other (See Comments)    GI upset   Current Outpatient Prescriptions on File Prior to Visit  Medication Sig Dispense Refill  . amLODipine (NORVASC) 10 MG tablet TAKE 1 TABLET (10 MG TOTAL) BY MOUTH DAILY.  30 tablet  11  . atorvastatin (LIPITOR) 40 MG tablet Take 1 tablet (40 mg total) by mouth daily.  30 tablet  3  . B-D UF III MINI PEN NEEDLES 31G X 5 MM MISC       . furosemide (LASIX) 20 MG tablet TAKE 1/2 TABLET (10 MG TOTAL) BY MOUTH DAILY.  30 tablet  5  . glucose blood test strip 1 each by Other route 4 (four) times daily. And lancets 4/day 250.01      . levothyroxine (LEVOTHROID) 25 MCG tablet Take 1 tablet (25 mcg total) by  mouth daily before breakfast.  30 tablet  6  . losartan (COZAAR) 25 MG tablet Take 1 tablet (25 mg total) by mouth daily.  30 tablet  6  . metoprolol succinate (TOPROL-XL) 100 MG 24 hr tablet TAKE 1 TABLET BY MOUTH DAILY. TAKE WITH OR IMMEDIATELY FOLLOWING A MEAL.  30 tablet  3  . omeprazole (PRILOSEC) 40 MG capsule Take 1 capsule (40 mg total) by mouth daily as needed.  30 capsule  3  . potassium chloride SA (KLOR-CON M20) 20 MEQ tablet TAKE 1 TABLET (20 MEQ TOTAL) BY MOUTH DAILY.  30 tablet  3  . Insulin Lispro Prot & Lispro (HUMALOG MIX 50/50 KWIKPEN) (50-50) 100 UNIT/ML SUPN Inject 110 Units into the skin daily after breakfast. and 70 units with the evening meal, and pen needles 2/day  20 pen  11   No current facility-administered medications on file prior to visit.     Review of Systems  Constitutional: Negative for fever, chills, activity change, appetite change, fatigue and unexpected weight change.       Serial 3's - ok Car, apple, may  HENT: Positive for congestion. Negative for hearing loss.   Eyes: Negative for visual disturbance.  Respiratory: Positive for cough. Negative for chest tightness, shortness of breath and wheezing.   Cardiovascular: Negative for chest pain, palpitations and leg swelling.  Gastrointestinal: Positive for constipation. Negative for nausea, vomiting, abdominal pain, diarrhea, blood in stool and abdominal distention.  Genitourinary: Negative for hematuria and difficulty urinating.  Musculoskeletal: Negative for arthralgias, myalgias and neck pain.  Skin: Negative for rash.  Neurological: Positive for headaches. Negative for dizziness, seizures and syncope.  Hematological: Negative for adenopathy. Does not bruise/bleed easily.  Psychiatric/Behavioral: Negative for dysphoric mood. The patient is not nervous/anxious.        Objective:   Physical Exam  Nursing note and vitals reviewed. Constitutional: She is oriented to person, place, and time. She  appears well-developed and well-nourished. No distress.  HENT:  Head: Normocephalic and atraumatic.  Right Ear: Hearing, tympanic membrane, external ear and ear canal normal.  Left Ear: Hearing, tympanic membrane, external ear and ear canal normal.  Nose: Nose normal.  Mouth/Throat: Oropharynx is clear and moist. No oropharyngeal exudate.  Eyes: Conjunctivae and EOM are normal. Pupils are equal, round, and reactive to light. No scleral icterus.  Neck: Normal range of motion. Neck supple. No thyromegaly present.  Cardiovascular: Normal rate, regular rhythm, normal heart sounds and intact distal pulses.   No murmur heard. Pulses:      Radial pulses are 2+  on the right side, and 2+ on the left side.  Pulmonary/Chest: Effort normal and breath sounds normal. No respiratory distress. She has no wheezes. She has no rales.  Abdominal: Soft. Bowel sounds are normal. She exhibits no distension and no mass. There is no tenderness. There is no rebound and no guarding.  Musculoskeletal: Normal range of motion. She exhibits no edema.  Lymphadenopathy:    She has no cervical adenopathy.  Neurological: She is alert and oriented to person, place, and time.  CN grossly intact, station and gait intact  Skin: Skin is warm and dry. No rash noted.  Psychiatric: She has a normal mood and affect. Her behavior is normal. Judgment and thought content normal.       Assessment & Plan:

## 2013-06-28 NOTE — Assessment & Plan Note (Signed)
I have personally reviewed the Medicare Annual Wellness questionnaire and have noted 1. The patient's medical and social history 2. Their use of alcohol, tobacco or illicit drugs 3. Their current medications and supplements 4. The patient's functional ability including ADL's, fall risks, home safety risks and hearing or visual impairment. 5. Diet and physical activity 6. Evidence for depression or mood disorders The patients weight, height, BMI have been recorded in the chart.  Hearing and vision has been addressed. I have made referrals, counseling and provided education to the patient based review of the above and I have provided the pt with a written personalized care plan for preventive services. See scanned questionairre. Advanced directives discussed: has at home  Reviewed preventative protocols and updated unless pt declined. UTD immunizations, declines flu shot. Will refer for colonoscopy as per pt due in h/o colon polyps

## 2013-06-28 NOTE — Patient Instructions (Addendum)
Pass by Marion's office to schedule evaluation for colonoscopy. I've sent zostavax or shingles shot to your pharmacy - let us know when you receive it to update your chart. Blood work today. Increase losartan to 2 pills daily (I have sent higher dose to your mail order pharmacy - 50mg  daily). Sounds like you have a upper respiratory infection, likely viral. Viral infections usually take 7-10 days to resolve.  The cough can last a few weeks to go away. Use medication as prescribed: cheratussin for cough at night Plain mucinex or immediate release guaifenesin with plenty of fluid to mobilize mucous. Push fluids and plenty of rest. If symptoms persist past 10 days or worsen, fill antibiotic provided today (zpack) Call clinic with questions.  Good to see you today.

## 2013-06-28 NOTE — Progress Notes (Signed)
Pre-visit discussion using our clinic review tool. No additional management support is needed unless otherwise documented below in the visit note.  

## 2013-06-29 LAB — CBC WITH DIFFERENTIAL/PLATELET
Basophils Absolute: 0 10*3/uL (ref 0.0–0.1)
Eosinophils Absolute: 0.4 10*3/uL (ref 0.0–0.7)
Eosinophils Relative: 4.1 % (ref 0.0–5.0)
HCT: 39.2 % (ref 36.0–46.0)
Hemoglobin: 13.5 g/dL (ref 12.0–15.0)
Lymphocytes Relative: 19.6 % (ref 12.0–46.0)
Lymphs Abs: 1.8 10*3/uL (ref 0.7–4.0)
MCHC: 34.3 g/dL (ref 30.0–36.0)
Monocytes Absolute: 0.4 10*3/uL (ref 0.1–1.0)
Neutro Abs: 6.5 10*3/uL (ref 1.4–7.7)
Platelets: 184 10*3/uL (ref 150.0–400.0)
RDW: 14.6 % (ref 11.5–14.6)
WBC: 9.1 10*3/uL (ref 4.5–10.5)

## 2013-06-29 LAB — VITAMIN D 25 HYDROXY (VIT D DEFICIENCY, FRACTURES): Vit D, 25-Hydroxy: 31 ng/mL (ref 30–89)

## 2013-07-03 ENCOUNTER — Other Ambulatory Visit: Payer: Self-pay | Admitting: Family Medicine

## 2013-07-03 ENCOUNTER — Encounter: Payer: Self-pay | Admitting: Family Medicine

## 2013-07-03 DIAGNOSIS — N2581 Secondary hyperparathyroidism of renal origin: Secondary | ICD-10-CM | POA: Insufficient documentation

## 2013-07-03 MED ORDER — VITAMIN D3 25 MCG (1000 UT) PO CAPS: 1.0000 | ORAL_CAPSULE | Freq: Every day | ORAL | Status: DC

## 2013-07-04 ENCOUNTER — Encounter: Payer: Self-pay | Admitting: Family Medicine

## 2013-07-05 MED ORDER — VITAMIN D (ERGOCALCIFEROL) 1.25 MG (50000 UNIT) PO CAPS: 50000.0000 [IU] | ORAL_CAPSULE | ORAL | Status: DC

## 2013-07-05 MED ORDER — VITAMIN D 50 MCG (2000 UT) PO CAPS: 1.0000 | ORAL_CAPSULE | Freq: Every day | ORAL | Status: DC

## 2013-07-05 NOTE — Telephone Encounter (Signed)
See My chart message

## 2013-07-20 DIAGNOSIS — Z1283 Encounter for screening for malignant neoplasm of skin: Secondary | ICD-10-CM | POA: Diagnosis not present

## 2013-07-20 DIAGNOSIS — D485 Neoplasm of uncertain behavior of skin: Secondary | ICD-10-CM | POA: Diagnosis not present

## 2013-07-20 DIAGNOSIS — L719 Rosacea, unspecified: Secondary | ICD-10-CM | POA: Diagnosis not present

## 2013-07-20 DIAGNOSIS — D235 Other benign neoplasm of skin of trunk: Secondary | ICD-10-CM | POA: Diagnosis not present

## 2013-07-21 MED ORDER — VITAMIN D (ERGOCALCIFEROL) 1.25 MG (50000 UNIT) PO CAPS: 50000.0000 [IU] | ORAL_CAPSULE | ORAL | Status: DC

## 2013-07-21 NOTE — Addendum Note (Signed)
Addended by: Royann Shivers A on: 07/21/2013 07:03 PM   Modules accepted: Orders

## 2013-07-25 ENCOUNTER — Other Ambulatory Visit: Payer: Self-pay | Admitting: *Deleted

## 2013-07-25 MED ORDER — POTASSIUM CHLORIDE CRYS ER 20 MEQ PO TBCR: EXTENDED_RELEASE_TABLET | ORAL | Status: DC

## 2013-07-26 DIAGNOSIS — M999 Biomechanical lesion, unspecified: Secondary | ICD-10-CM | POA: Diagnosis not present

## 2013-07-26 DIAGNOSIS — M5430 Sciatica, unspecified side: Secondary | ICD-10-CM | POA: Diagnosis not present

## 2013-07-27 DIAGNOSIS — M999 Biomechanical lesion, unspecified: Secondary | ICD-10-CM | POA: Diagnosis not present

## 2013-07-27 DIAGNOSIS — M5430 Sciatica, unspecified side: Secondary | ICD-10-CM | POA: Diagnosis not present

## 2013-08-01 DIAGNOSIS — M999 Biomechanical lesion, unspecified: Secondary | ICD-10-CM | POA: Diagnosis not present

## 2013-08-01 DIAGNOSIS — M5430 Sciatica, unspecified side: Secondary | ICD-10-CM | POA: Diagnosis not present

## 2013-08-02 DIAGNOSIS — M999 Biomechanical lesion, unspecified: Secondary | ICD-10-CM | POA: Diagnosis not present

## 2013-08-02 DIAGNOSIS — M5430 Sciatica, unspecified side: Secondary | ICD-10-CM | POA: Diagnosis not present

## 2013-08-04 DIAGNOSIS — M999 Biomechanical lesion, unspecified: Secondary | ICD-10-CM | POA: Diagnosis not present

## 2013-08-04 DIAGNOSIS — M5430 Sciatica, unspecified side: Secondary | ICD-10-CM | POA: Diagnosis not present

## 2013-08-06 ENCOUNTER — Other Ambulatory Visit: Payer: Self-pay | Admitting: Endocrinology

## 2013-08-08 DIAGNOSIS — M5430 Sciatica, unspecified side: Secondary | ICD-10-CM | POA: Diagnosis not present

## 2013-08-08 DIAGNOSIS — M999 Biomechanical lesion, unspecified: Secondary | ICD-10-CM | POA: Diagnosis not present

## 2013-08-10 DIAGNOSIS — M5430 Sciatica, unspecified side: Secondary | ICD-10-CM | POA: Diagnosis not present

## 2013-08-10 DIAGNOSIS — M999 Biomechanical lesion, unspecified: Secondary | ICD-10-CM | POA: Diagnosis not present

## 2013-08-15 ENCOUNTER — Encounter: Payer: Self-pay | Admitting: Family Medicine

## 2013-08-17 ENCOUNTER — Ambulatory Visit: Payer: Medicare Other | Admitting: Endocrinology

## 2013-08-22 ENCOUNTER — Other Ambulatory Visit: Payer: Self-pay | Admitting: Family Medicine

## 2013-08-23 ENCOUNTER — Encounter: Payer: Medicare Other | Admitting: Internal Medicine

## 2013-08-28 ENCOUNTER — Other Ambulatory Visit: Payer: Self-pay | Admitting: Family Medicine

## 2013-09-12 ENCOUNTER — Other Ambulatory Visit: Payer: Self-pay

## 2013-09-12 MED ORDER — GLUCOSE BLOOD VI STRP: ORAL_STRIP | Status: DC

## 2013-10-04 ENCOUNTER — Other Ambulatory Visit: Payer: Self-pay | Admitting: Family Medicine

## 2013-10-04 NOTE — Telephone Encounter (Signed)
Received refill request electronically. Last refill 07/21/13, last office visit 06/28/13. Is it okay to refill medication?

## 2013-10-10 ENCOUNTER — Other Ambulatory Visit: Payer: Self-pay | Admitting: Family Medicine

## 2013-10-13 ENCOUNTER — Telehealth: Payer: Self-pay | Admitting: Family Medicine

## 2013-10-13 MED ORDER — AMOXICILLIN 500 MG PO CAPS: 1000.0000 mg | ORAL_CAPSULE | Freq: Two times a day (BID) | ORAL | Status: DC

## 2013-10-13 NOTE — Telephone Encounter (Signed)
Julia Banks notified prescription for antibiotics have been sent to pharmacy.

## 2013-10-13 NOTE — Telephone Encounter (Signed)
i am fine with this  amox 500 mg, 2 tabs po bid for 10 days, #40, 0 ref

## 2013-10-13 NOTE — Telephone Encounter (Signed)
Dr. Danise Mina out of town.  Routed to Dr. Lorelei Pont.

## 2013-10-13 NOTE — Telephone Encounter (Signed)
Patient Information:  Caller Name: [name]  Phone: [phone]  Patient: Julia Banks  Gender: Female  DOB: 13-Jul-1940  Age: 73 Years  PCP: Philemon Kingdom (Endocrinology at [facility] office)  Office Follow Up:  Does the office need to follow up with this patient?: No  Instructions For The Office: N/A  RN Note:  Pharm [facility] in [place].  Please advise Neice if you will be calling in medication or if pt needs to be seen  Symptoms  Reason For Call & Symptoms: Pt woke with sore throat and nausea.  She has low grade fever of 100.3.  Pt has been exposed to Strep in the past week.  Pt would like to have an abx called in to cover the strep.  All other sxs denied.  BS have been around 120.  Reviewed Health History In EMR: Yes  Reviewed Medications In EMR: Yes  Reviewed Allergies In EMR: Yes  Reviewed Surgeries / Procedures: Yes  Date of Onset of Symptoms: 10/13/2013  Guideline(s) Used:  Sore Throat  Disposition Per Guideline:   Strep Test Only Visit Today or Tomorrow  Reason For Disposition Reached:   Strep exposure within last 10 days  Advice Given:  Call Back If:  You become worse.  RN Overrode Recommendation:  Patient Requests Prescription  Please review

## 2013-10-14 ENCOUNTER — Ambulatory Visit: Payer: Medicare Other | Admitting: Family Medicine

## 2013-10-25 ENCOUNTER — Ambulatory Visit (INDEPENDENT_AMBULATORY_CARE_PROVIDER_SITE_OTHER): Payer: Medicare Other | Admitting: Family Medicine

## 2013-10-25 ENCOUNTER — Encounter: Payer: Self-pay | Admitting: Family Medicine

## 2013-10-25 VITALS — BP 160/80 | HR 88 | Temp 98.0°F | Wt 196.8 lb

## 2013-10-25 DIAGNOSIS — I10 Essential (primary) hypertension: Secondary | ICD-10-CM

## 2013-10-25 DIAGNOSIS — E1140 Type 2 diabetes mellitus with diabetic neuropathy, unspecified: Secondary | ICD-10-CM

## 2013-10-25 DIAGNOSIS — E1149 Type 2 diabetes mellitus with other diabetic neurological complication: Secondary | ICD-10-CM

## 2013-10-25 DIAGNOSIS — E1122 Type 2 diabetes mellitus with diabetic chronic kidney disease: Secondary | ICD-10-CM

## 2013-10-25 DIAGNOSIS — E1165 Type 2 diabetes mellitus with hyperglycemia: Principal | ICD-10-CM

## 2013-10-25 DIAGNOSIS — E1142 Type 2 diabetes mellitus with diabetic polyneuropathy: Secondary | ICD-10-CM

## 2013-10-25 DIAGNOSIS — N183 Chronic kidney disease, stage 3 unspecified: Secondary | ICD-10-CM

## 2013-10-25 DIAGNOSIS — E039 Hypothyroidism, unspecified: Secondary | ICD-10-CM | POA: Diagnosis not present

## 2013-10-25 DIAGNOSIS — E1129 Type 2 diabetes mellitus with other diabetic kidney complication: Secondary | ICD-10-CM

## 2013-10-25 DIAGNOSIS — F4321 Adjustment disorder with depressed mood: Secondary | ICD-10-CM

## 2013-10-25 DIAGNOSIS — IMO0002 Reserved for concepts with insufficient information to code with codable children: Secondary | ICD-10-CM

## 2013-10-25 LAB — RENAL FUNCTION PANEL
Albumin: 4.1 g/dL (ref 3.5–5.2)
BUN: 8 mg/dL (ref 6–23)
CALCIUM: 9.4 mg/dL (ref 8.4–10.5)
CHLORIDE: 103 meq/L (ref 96–112)
CO2: 25 mEq/L (ref 19–32)
Creatinine, Ser: 1.3 mg/dL — ABNORMAL HIGH (ref 0.4–1.2)
GFR: 44.94 mL/min — AB (ref >60.00)
Glucose, Bld: 180 mg/dL — ABNORMAL HIGH (ref 70–99)
POTASSIUM: 4 meq/L (ref 3.5–5.1)
Phosphorus: 3.8 mg/dL (ref 2.3–4.6)
SODIUM: 136 meq/L (ref 135–145)

## 2013-10-25 LAB — HEMOGLOBIN A1C: Hgb A1c MFr Bld: 8.5 % — ABNORMAL HIGH (ref 4.6–6.5)

## 2013-10-25 LAB — TSH: TSH: 2.15 u[IU]/mL (ref 0.35–5.50)

## 2013-10-25 NOTE — Assessment & Plan Note (Signed)
Check tsh today 

## 2013-10-25 NOTE — Patient Instructions (Signed)
Schedule appointment with Dr. Loanne Drilling when you're due. No changes to medicines today. I anticipate sugar better controlled with noted weight loss. Keep a log of your blood pressures and drop them off here in 1-2 weeks to monitor more closely.  If blood pressure at home running ok, return to see me in 4-6 months, sooner if running high.

## 2013-10-25 NOTE — Assessment & Plan Note (Signed)
Recently lost husband. Support provided.

## 2013-10-25 NOTE — Assessment & Plan Note (Signed)
Recheck A1c and encouraged f/u with endo. Anticipate improvement with weight loss noted.

## 2013-10-25 NOTE — Assessment & Plan Note (Signed)
Chronic, elevated. Pt attributes good control at home. No changes today. I asked her to bring me log from home to verify good control. Continue meds for now.

## 2013-10-25 NOTE — Progress Notes (Signed)
BP 160/80  Pulse 88  Temp(Src) 98 F (36.7 C) (Oral)  Wt 196 lb 12 oz (89.245 kg)   CC: 4 mo f/u  Subjective:    Patient ID: Julia Banks, female    DOB: [birth year], 73 y.o.   MRN: [id number]  HPI: Julia Banks is a 73 y.o. female presenting on 10/25/2013 for Follow-up   Recently lost husband earlier this year.  HTN - elevated today - attributes to white coat hypertension.  compliant with losartan 50mg  and amlodipine 10mg , and lasix 10mg  daily, toprol xl 100mg  daily.  States better control at home.  DM - follows with Dr. Loanne Drilling.  Has not returned to see him.  Sugars running better - endorses 120s fasting.  Less eating out.  14 lb weight loss. Wt Readings from Last 3 Encounters:  10/25/13 196 lb 12 oz (89.245 kg)  06/28/13 210 lb 8 oz (95.482 kg)  04/13/13 207 lb (93.895 kg)   Body mass index is 31.77 kg/(m^2).  Shingles shot - not done yet.  Too expensive.  Currently on amoxicillin for presumed strep throat.  Improving.  Relevant past medical, surgical, family and social history reviewed and updated as indicated.  Allergies and medications reviewed and updated. Current Outpatient Prescriptions on File Prior to Visit  Medication Sig  . amLODipine (NORVASC) 10 MG tablet TAKE 1 TABLET (10 MG TOTAL) BY MOUTH DAILY for blood pressure.  Marland Kitchen amoxicillin (AMOXIL) 500 MG capsule Take 2 capsules (1,000 mg total) by mouth 2 (two) times daily.  Marland Kitchen atorvastatin (LIPITOR) 40 MG tablet TAKE 1 TABLET (40 MG TOTAL) BY MOUTH DAILY.  Marland Kitchen B-D UF III MINI PEN NEEDLES 31G X 5 MM MISC USE 4 TIMES DAILY AS DIRECTED  . furosemide (LASIX) 20 MG tablet TAKE 1/2 TABLET (10 MG TOTAL) BY MOUTH DAILY for blood pressure  . glucose blood (ACCU-CHEK AVIVA PLUS) test strip Use 4/day to check blood sugars.  . Insulin Aspart Prot & Aspart (NOVOLOG MIX 70/30 FLEXPEN [place]) Inject 50 Units into the skin 2 (two) times daily. 67 units every morning and 42 Units every evening  . levothyroxine (LEVOTHROID) 25 MCG  tablet Take 1 tablet (25 mcg total) by mouth daily before breakfast.  . losartan (COZAAR) 50 MG tablet Take 1 tablet (50 mg total) by mouth daily. For blood pressure  . metoprolol succinate (TOPROL-XL) 100 MG 24 hr tablet TAKE 1 TABLET BY MOUTH DAILY. TAKE WITH OR IMMEDIATELY FOLLOWING A MEAL.  Marland Kitchen omeprazole (PRILOSEC) 40 MG capsule TAKE 1 CAPSULE (40 MG TOTAL) BY MOUTH DAILY AS NEEDED.  Marland Kitchen potassium chloride SA (KLOR-CON M20) 20 MEQ tablet TAKE 1 TABLET (20 MEQ TOTAL) BY MOUTH DAILY.  Marland Kitchen Cholecalciferol (VITAMIN D) 2000 UNITS CAPS Take 1 capsule (2,000 Units total) by mouth daily.   No current facility-administered medications on file prior to visit.    Review of Systems Per HPI unless specifically indicated above    Objective:    BP 160/80  Pulse 88  Temp(Src) 98 F (36.7 C) (Oral)  Wt 196 lb 12 oz (89.245 kg)  Physical Exam  Nursing note and vitals reviewed. Constitutional: She appears well-developed and well-nourished. No distress.  HENT:  Mouth/Throat: Oropharynx is clear and moist. No oropharyngeal exudate.  Eyes: Conjunctivae and EOM are normal. Pupils are equal, round, and reactive to light.  Neck: Normal range of motion. Neck supple. Carotid bruit is not present.  Cardiovascular: Normal rate, regular rhythm, normal heart sounds and intact distal pulses.   No murmur heard.  Pulmonary/Chest: Effort normal and breath sounds normal. No respiratory distress. She has no wheezes. She has no rales.  Musculoskeletal: She exhibits no edema.   Results for orders placed in visit on 06/28/13  VITAMIN D 25 HYDROXY      Result Value Ref Range   Vit D, 25-Hydroxy 31  30 - 89 ng/mL  CBC WITH DIFFERENTIAL      Result Value Ref Range   WBC 9.1  4.5 - 10.5 K/uL   RBC 4.64  3.87 - 5.11 Mil/uL   Hemoglobin 13.5  12.0 - 15.0 g/dL   HCT 39.2  36.0 - 46.0 %   MCV 84.5  78.0 - 100.0 fl   MCHC 34.3  30.0 - 36.0 g/dL   RDW 14.6  11.5 - 14.6 %   Platelets 184.0  150.0 - 400.0 K/uL   Neutrophils  Relative % 71.5  43.0 - 77.0 %   Lymphocytes Relative 19.6  12.0 - 46.0 %   Monocytes Relative 4.6  3.0 - 12.0 %   Eosinophils Relative 4.1  0.0 - 5.0 %   Basophils Relative 0.2  0.0 - 3.0 %   Neutro Abs 6.5  1.4 - 7.7 K/uL   Lymphs Abs 1.8  0.7 - 4.0 K/uL   Monocytes Absolute 0.4  0.1 - 1.0 K/uL   Eosinophils Absolute 0.4  0.0 - 0.7 K/uL   Basophils Absolute 0.0  0.0 - 0.1 K/uL  PARATHYROID HORMONE, INTACT (NO CA)      Result Value Ref Range   PTH 125.0 (*) 14.0 - 72.0 pg/mL  HM DIABETES EYE EXAM      Result Value Ref Range   HM Diabetic Eye Exam normal per patient        Assessment & Plan:   Problem List Items Addressed This Visit   Type 2 diabetes, uncontrolled, with neuropathy - Primary     Recheck A1c and encouraged f/u with endo. Anticipate improvement with weight loss noted.    Relevant Orders      Renal function panel      Hemoglobin A1c   Essential hypertension, benign     Chronic, elevated. Pt attributes good control at home. No changes today. I asked her to bring me log from home to verify good control. Continue meds for now.    Unspecified hypothyroidism     Check tsh today.    Relevant Orders      TSH   Diabetes mellitus with stage 3 chronic kidney disease     Again reviewed CKD.  Check Cr today.    Grieving     Recently lost husband. Support provided.        Follow up plan: Return in about 4 months (around 02/24/2014), or as needed, for follow up visit.

## 2013-10-25 NOTE — Progress Notes (Signed)
Pre visit review using our clinic review tool, if applicable. No additional management support is needed unless otherwise documented below in the visit note. 

## 2013-10-25 NOTE — Assessment & Plan Note (Signed)
Again reviewed CKD.  Check Cr today.

## 2013-10-28 ENCOUNTER — Telehealth: Payer: Self-pay

## 2013-10-28 HISTORY — PX: CARDIAC CATHETERIZATION: SHX172

## 2013-10-28 NOTE — Telephone Encounter (Signed)
Relevant patient education assigned to patient using Emmi. ° °

## 2013-11-01 ENCOUNTER — Encounter: Payer: Self-pay | Admitting: Cardiovascular Disease

## 2013-11-01 ENCOUNTER — Ambulatory Visit (INDEPENDENT_AMBULATORY_CARE_PROVIDER_SITE_OTHER): Payer: Medicare Other | Admitting: Cardiovascular Disease

## 2013-11-01 VITALS — BP 148/78 | HR 77 | Ht 66.0 in | Wt 196.0 lb

## 2013-11-01 DIAGNOSIS — Z01812 Encounter for preprocedural laboratory examination: Secondary | ICD-10-CM

## 2013-11-01 DIAGNOSIS — R079 Chest pain, unspecified: Secondary | ICD-10-CM

## 2013-11-01 DIAGNOSIS — I2510 Atherosclerotic heart disease of native coronary artery without angina pectoris: Secondary | ICD-10-CM

## 2013-11-01 DIAGNOSIS — E785 Hyperlipidemia, unspecified: Secondary | ICD-10-CM

## 2013-11-01 DIAGNOSIS — G4733 Obstructive sleep apnea (adult) (pediatric): Secondary | ICD-10-CM

## 2013-11-01 DIAGNOSIS — I10 Essential (primary) hypertension: Secondary | ICD-10-CM | POA: Diagnosis not present

## 2013-11-01 DIAGNOSIS — Z9989 Dependence on other enabling machines and devices: Secondary | ICD-10-CM

## 2013-11-01 NOTE — Assessment & Plan Note (Signed)
Blood pressure is reasonably controlled on current medications. 

## 2013-11-01 NOTE — Assessment & Plan Note (Signed)
The patient has known history of mild nonobstructive coronary artery disease on previous cardiac catheterization in 2006. Current symptoms are highly suggestive of nocturnal angina which has been worsening and associated with significant exertional dyspnea consistent with class III angina in spite of 2 antiangina medications. She has multiple risk factors for coronary artery disease. I discussed with her different management options. The best option is probably proceeding with cardiac catheterization and possible coronary intervention. Risks, benefits and benefits were discussed. She reports intolerance to aspirin due to severe GI upset.

## 2013-11-01 NOTE — Assessment & Plan Note (Addendum)
Continue treatment with atorvastatin with a target LDL of less than 100 and ideally less than 70.

## 2013-11-01 NOTE — Patient Instructions (Signed)
[facility] Cardiac Cath Instructions   You are scheduled for a Cardiac Cath on:______05/07/15_________________  Please arrive at _______am on the day of your procedure  You will need to pre-register prior to the day of your procedure.  Enter through the [employer] at [facility].  Registration is the first desk on your right.  Please take the procedure order we have given you in order to be registered appropriately  Do not eat/drink anything after midnight  Someone will need to drive you home  It is recommended someone be with you for the first 24 hours after your procedure  Wear clothes that are easy to get on/off and wear slip on shoes if possible   Medications bring a current list of all medications with you  _x__ You may take all of your medications the morning of your procedure with enough water to swallow safely   Day of your procedure: Arrive at the [facility] entrance.  Free valet service is available.  After entering the [facility] please check-in at the registration desk (1st desk on your right) to receive your armband. After receiving your armband someone will escort you to the cardiac cath/special procedures waiting area.  The usual length of stay after your procedure is about 2 to 3 hours.  This can vary.  If you have any questions, please call our office at [phone], or you may call the cardiac cath lab at [facility] directly at [phone]  You are having pre procedure labs today   I will call you with Cath time tomorrow

## 2013-11-01 NOTE — Assessment & Plan Note (Signed)
I advised her to start using the CPAP machine again

## 2013-11-01 NOTE — Progress Notes (Signed)
HPI  This is a pleasant 73 year old female who is here today for a followup visit regarding chest pain. She was added to my schedule due to symptoms of worsening chest pain. She moved from [place] about 2 years ago. She used to be seen by a cardiologist there, Dr. Pleas Patricia of [place] [facility]. She had cardiac catheterization done in 2006 which showed mild 30% disease in one of the coronary arteries. The patient has multiple risk factors for coronary artery disease including diabetes which has been mostly uncontrolled, hypertension, hyperlipidemia and obesity. There is also family history of premature coronary artery disease. She was seen by me last year for atypical chest pain. She underwent a nuclear stress test which showed no evidence of ischemia at bedtime. Symptoms improved without intervention.  However, over the last few months she has experienced almost daily episodes of nocturnal substernal chest tightness which wakes her up from sleep and last for about 15 minutes. This is usually associated with significant shortness of breath. She does have sleep apnea and has not been using CPAP on a regular basis. She also has known history of GERD but current symptoms are different. She endorses worsening exertional dyspnea. She has been under stress lately as her husband died in Aug 18, 2022. Nonetheless, current symptoms were happening even before the event.  Allergies  Allergen Reactions  . Altace [Ramipril] Other (See Comments)    Upset stomach  . Aspirin   . Metformin And Related Other (See Comments)    GI upset     Current Outpatient Prescriptions on File Prior to Visit  Medication Sig Dispense Refill  . amLODipine (NORVASC) 10 MG tablet TAKE 1 TABLET (10 MG TOTAL) BY MOUTH DAILY for blood pressure.      Marland Kitchen atorvastatin (LIPITOR) 40 MG tablet TAKE 1 TABLET (40 MG TOTAL) BY MOUTH DAILY.  30 tablet  3  . B-D UF III MINI PEN NEEDLES 31G X 5 MM MISC USE 4 TIMES DAILY AS DIRECTED   200 each  5  . furosemide (LASIX) 20 MG tablet TAKE 1/2 TABLET (10 MG TOTAL) BY MOUTH DAILY for blood pressure      . glucose blood (ACCU-CHEK AVIVA PLUS) test strip Use 4/day to check blood sugars.  150 each  2  . Insulin Aspart Prot & Aspart (NOVOLOG MIX 70/30 FLEXPEN [place]) Inject 50 Units into the skin 2 (two) times daily. 67 units every morning and 42 Units every evening      . levothyroxine (LEVOTHROID) 25 MCG tablet Take 1 tablet (25 mcg total) by mouth daily before breakfast.  30 tablet  6  . losartan (COZAAR) 50 MG tablet Take 1 tablet (50 mg total) by mouth daily. For blood pressure  90 tablet  3  . metoprolol succinate (TOPROL-XL) 100 MG 24 hr tablet TAKE 1 TABLET BY MOUTH DAILY. TAKE WITH OR IMMEDIATELY FOLLOWING A MEAL.  30 tablet  3  . omeprazole (PRILOSEC) 40 MG capsule TAKE 1 CAPSULE (40 MG TOTAL) BY MOUTH DAILY AS NEEDED.  30 capsule  3  . potassium chloride SA (KLOR-CON M20) 20 MEQ tablet TAKE 1 TABLET (20 MEQ TOTAL) BY MOUTH DAILY.  30 tablet  11   No current facility-administered medications on file prior to visit.     Past Medical History  Diagnosis Date  . Asthma   . Arthritis   . Headaches, cluster   . Type 2 diabetes, uncontrolled, with neuropathy   . Colon polyps  benign  . Hyperlipidemia   . Hypertension   . GERD (gastroesophageal reflux disease)   . Sleep apnea     on CPAP  . Coronary artery disease     30% stenosis reported on previous cardiac catheterization in 2006. This was done in [facility]  . Chronic kidney disease, stage 3   . Secondary hyperparathyroidism of renal origin     likely     Past Surgical History  Procedure Laterality Date  . Gallbladder surgery    . Breast biopsy      right; benign  . Appendectomy    . Abdominal hysterectomy      heavy bleeding, ovaries remain  . Cardiac catheterization  2006    [place], [place].   . Cardiovascular stress test  09/2012    W[facility] [place] [facility])     Family History  Problem Relation Age  of Onset  . Arthritis Mother   . Hyperlipidemia Mother   . Stroke Mother   . Hypertension Mother   . Diabetes Mother   . Arthritis Father   . Hyperlipidemia Father   . CAD Father     MI  . Hypertension Father   . Cancer Neg Hx      History   Social History  . Marital Status: Married    Spouse Name: N/A    Number of Children: N/A  . Years of Education: N/A   Occupational History  . Not on file.   Social History Main Topics  . Smoking status: Never Smoker   . Smokeless tobacco: Never Used     Comment: second hand exposure  . Alcohol Use: No  . Drug Use: No  . Sexual Activity: Not on file   Other Topics Concern  . Not on file   Social History Narrative   Widow, husband Fritz Pickerel passed 70.  Has 3 dogs   No children   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, nutritional   Edu: 1 yr college      G0P0       PHYSICAL EXAM   BP 148/78  Pulse 77  Ht 5\' 6"  (1.676 m)  Wt 196 lb (88.905 kg)  BMI 31.65 kg/m2 Constitutional: She is oriented to person, place, and time. She appears well-developed and well-nourished. No distress.  HENT: No nasal discharge.  Head: Normocephalic and atraumatic.  Eyes: Pupils are equal and round. Right eye exhibits no discharge. Left eye exhibits no discharge.  Neck: Normal range of motion. Neck supple. No JVD present. No thyromegaly present.  Cardiovascular: Normal rate, regular rhythm, normal heart sounds. Exam reveals no gallop and no friction rub. No murmur heard.  Pulmonary/Chest: Effort normal and breath sounds normal. No stridor. No respiratory distress. She has no wheezes. She has no rales. She exhibits no tenderness.  Abdominal: Soft. Bowel sounds are normal. She exhibits no distension. There is no tenderness. There is no rebound and no guarding.  Musculoskeletal: Normal range of motion. She exhibits trace edema and no tenderness.  Neurological: She is alert and oriented to person, place, and time.  Coordination normal.  Skin: Skin is warm and dry. No rash noted. She is not diaphoretic. No erythema. No pallor.  Psychiatric: She has a normal mood and affect. Her behavior is normal. Judgment and thought content normal.     EKG: Sinus  Rhythm  -Poor R-wave progression -may be secondary to pulmonary disease   consider old anterior infarct.   Low voltage with rightward P-axis and  rotation -possible pulmonary disease.   ABNORMAL    ASSESSMENT AND PLAN

## 2013-11-02 ENCOUNTER — Telehealth: Payer: Self-pay | Admitting: *Deleted

## 2013-11-02 LAB — CBC WITH DIFFERENTIAL
BASOS ABS: 0 10*3/uL (ref 0.0–0.2)
Basos: 0 %
EOS ABS: 0.3 10*3/uL (ref 0.0–0.4)
Eos: 4 %
HCT: 40.3 % (ref 34.0–46.6)
Hemoglobin: 13.7 g/dL (ref 11.1–15.9)
IMMATURE GRANULOCYTES: 0 %
Immature Grans (Abs): 0 10*3/uL (ref 0.0–0.1)
LYMPHS ABS: 1.8 10*3/uL (ref 0.7–3.1)
Lymphs: 23 %
MCH: 28.8 pg (ref 26.6–33.0)
MCHC: 34 g/dL (ref 31.5–35.7)
MCV: 85 fL (ref 79–97)
MONOCYTES: 5 %
MONOS ABS: 0.4 10*3/uL (ref 0.1–0.9)
Neutrophils Absolute: 5.2 10*3/uL (ref 1.4–7.0)
Neutrophils Relative %: 68 %
PLATELETS: 204 10*3/uL (ref 150–379)
RBC: 4.75 x10E6/uL (ref 3.77–5.28)
RDW: 14.4 % (ref 12.3–15.4)
WBC: 7.6 10*3/uL (ref 3.4–10.8)

## 2013-11-02 LAB — PROTIME-INR
INR: 1 (ref 0.8–1.2)
Prothrombin Time: 10.6 s (ref 9.1–12.0)

## 2013-11-02 LAB — BASIC METABOLIC PANEL
BUN / CREAT RATIO: 10 — AB (ref 11–26)
BUN: 11 mg/dL (ref 8–27)
CHLORIDE: 100 mmol/L (ref 97–108)
CO2: 21 mmol/L (ref 18–29)
Calcium: 9.3 mg/dL (ref 8.7–10.3)
Creatinine, Ser: 1.11 mg/dL — ABNORMAL HIGH (ref 0.57–1.00)
GFR calc Af Amer: 58 mL/min/{1.73_m2} — ABNORMAL LOW (ref >59)
GFR calc non Af Amer: 50 mL/min/{1.73_m2} — ABNORMAL LOW (ref >59)
Glucose: 189 mg/dL — ABNORMAL HIGH (ref 65–99)
POTASSIUM: 4.9 mmol/L (ref 3.5–5.2)
SODIUM: 139 mmol/L (ref 134–144)

## 2013-11-02 NOTE — Telephone Encounter (Signed)
Faxed orders to [facility] for Cardiac Cath.

## 2013-11-02 NOTE — Telephone Encounter (Signed)
Informed patient that her cath is scheduled for 10:30 am on 11/03/13 Patient to arrive at 09:30 AM  Patient also instructed to hold her diabetes medication the morning of and half dose the night before.  Patient verbalized understanding.

## 2013-11-03 ENCOUNTER — Encounter: Payer: Self-pay | Admitting: Cardiovascular Disease

## 2013-11-03 ENCOUNTER — Ambulatory Visit: Payer: Self-pay | Admitting: Cardiovascular Disease

## 2013-11-03 ENCOUNTER — Encounter: Payer: Self-pay | Admitting: Family Medicine

## 2013-11-03 DIAGNOSIS — I2510 Atherosclerotic heart disease of native coronary artery without angina pectoris: Secondary | ICD-10-CM | POA: Diagnosis not present

## 2013-11-03 DIAGNOSIS — E785 Hyperlipidemia, unspecified: Secondary | ICD-10-CM | POA: Diagnosis not present

## 2013-11-03 DIAGNOSIS — G4733 Obstructive sleep apnea (adult) (pediatric): Secondary | ICD-10-CM | POA: Diagnosis not present

## 2013-11-03 DIAGNOSIS — R079 Chest pain, unspecified: Secondary | ICD-10-CM | POA: Diagnosis not present

## 2013-11-03 DIAGNOSIS — M129 Arthropathy, unspecified: Secondary | ICD-10-CM | POA: Diagnosis not present

## 2013-11-03 DIAGNOSIS — J45909 Unspecified asthma, uncomplicated: Secondary | ICD-10-CM | POA: Diagnosis not present

## 2013-11-03 DIAGNOSIS — Z794 Long term (current) use of insulin: Secondary | ICD-10-CM | POA: Diagnosis not present

## 2013-11-03 DIAGNOSIS — Z833 Family history of diabetes mellitus: Secondary | ICD-10-CM | POA: Diagnosis not present

## 2013-11-03 DIAGNOSIS — Z86010 Personal history of colonic polyps: Secondary | ICD-10-CM | POA: Diagnosis not present

## 2013-11-03 DIAGNOSIS — I10 Essential (primary) hypertension: Secondary | ICD-10-CM | POA: Diagnosis not present

## 2013-11-03 DIAGNOSIS — E1142 Type 2 diabetes mellitus with diabetic polyneuropathy: Secondary | ICD-10-CM | POA: Diagnosis not present

## 2013-11-03 DIAGNOSIS — K219 Gastro-esophageal reflux disease without esophagitis: Secondary | ICD-10-CM | POA: Diagnosis not present

## 2013-11-03 DIAGNOSIS — Z8249 Family history of ischemic heart disease and other diseases of the circulatory system: Secondary | ICD-10-CM | POA: Diagnosis not present

## 2013-11-03 DIAGNOSIS — Z8261 Family history of arthritis: Secondary | ICD-10-CM | POA: Diagnosis not present

## 2013-11-03 DIAGNOSIS — Z823 Family history of stroke: Secondary | ICD-10-CM | POA: Diagnosis not present

## 2013-11-03 DIAGNOSIS — E1149 Type 2 diabetes mellitus with other diabetic neurological complication: Secondary | ICD-10-CM | POA: Diagnosis not present

## 2013-11-08 ENCOUNTER — Other Ambulatory Visit: Payer: Self-pay | Admitting: Obstetrics and Gynecology

## 2013-11-08 DIAGNOSIS — R921 Mammographic calcification found on diagnostic imaging of breast: Secondary | ICD-10-CM

## 2013-11-15 ENCOUNTER — Encounter: Payer: Self-pay | Admitting: Cardiovascular Disease

## 2013-11-15 ENCOUNTER — Ambulatory Visit (INDEPENDENT_AMBULATORY_CARE_PROVIDER_SITE_OTHER): Payer: Medicare Other | Admitting: Cardiovascular Disease

## 2013-11-15 VITALS — BP 149/84 | HR 69 | Ht 66.0 in | Wt 197.0 lb

## 2013-11-15 DIAGNOSIS — I2510 Atherosclerotic heart disease of native coronary artery without angina pectoris: Secondary | ICD-10-CM | POA: Diagnosis not present

## 2013-11-15 DIAGNOSIS — I10 Essential (primary) hypertension: Secondary | ICD-10-CM

## 2013-11-15 DIAGNOSIS — E785 Hyperlipidemia, unspecified: Secondary | ICD-10-CM | POA: Diagnosis not present

## 2013-11-15 DIAGNOSIS — Z9989 Dependence on other enabling machines and devices: Secondary | ICD-10-CM

## 2013-11-15 DIAGNOSIS — R079 Chest pain, unspecified: Secondary | ICD-10-CM

## 2013-11-15 DIAGNOSIS — G4733 Obstructive sleep apnea (adult) (pediatric): Secondary | ICD-10-CM

## 2013-11-15 NOTE — Progress Notes (Signed)
HPI  This is a pleasant 73 year old female who is here today for a followup visit regarding chest pain. She had cardiac catheterization done in 2006 which showed mild 30% disease in one of the coronary arteries. The patient has multiple risk factors for coronary artery disease including diabetes which has been mostly uncontrolled, hypertension, hyperlipidemia and obesity. There is also family history of premature coronary artery disease. She was seen recently for nocturnal chest pain worrisome for angina. I proceeded with cardiac catheterization via the right radial artery which showed mild nonobstructive 2 vessel coronary artery disease (20% in the left circumflex and 30% in the RCA). Ejection fraction was normal. I instructed her to start using the CPAP machine in a regular basis. Since then, she reports significant improvement in nocturnal chest pain.  Allergies  Allergen Reactions  . Altace [Ramipril] Other (See Comments)    Upset stomach  . Aspirin   . Metformin And Related Other (See Comments)    GI upset     Current Outpatient Prescriptions on File Prior to Visit  Medication Sig Dispense Refill  . amLODipine (NORVASC) 10 MG tablet TAKE 1 TABLET (10 MG TOTAL) BY MOUTH DAILY for blood pressure.      Marland Kitchen atorvastatin (LIPITOR) 40 MG tablet TAKE 1 TABLET (40 MG TOTAL) BY MOUTH DAILY.  30 tablet  3  . B-D UF III MINI PEN NEEDLES 31G X 5 MM MISC USE 4 TIMES DAILY AS DIRECTED  200 each  5  . furosemide (LASIX) 20 MG tablet TAKE 1/2 TABLET (10 MG TOTAL) BY MOUTH DAILY for blood pressure      . glucose blood (ACCU-CHEK AVIVA PLUS) test strip Use 4/day to check blood sugars.  150 each  2  . Insulin Aspart Prot & Aspart (NOVOLOG MIX 70/30 FLEXPEN [place]) Inject 50 Units into the skin 2 (two) times daily. 67 units every morning and 42 Units every evening      . levothyroxine (LEVOTHROID) 25 MCG tablet Take 1 tablet (25 mcg total) by mouth daily before breakfast.  30 tablet  6  . losartan  (COZAAR) 50 MG tablet Take 1 tablet (50 mg total) by mouth daily. For blood pressure  90 tablet  3  . metoprolol succinate (TOPROL-XL) 100 MG 24 hr tablet TAKE 1 TABLET BY MOUTH DAILY. TAKE WITH OR IMMEDIATELY FOLLOWING A MEAL.  30 tablet  3  . omeprazole (PRILOSEC) 40 MG capsule TAKE 1 CAPSULE (40 MG TOTAL) BY MOUTH DAILY AS NEEDED.  30 capsule  3  . potassium chloride SA (KLOR-CON M20) 20 MEQ tablet TAKE 1 TABLET (20 MEQ TOTAL) BY MOUTH DAILY.  30 tablet  11   No current facility-administered medications on file prior to visit.     Past Medical History  Diagnosis Date  . Asthma   . Arthritis   . Headaches, cluster   . Type 2 diabetes, uncontrolled, with neuropathy   . Colon polyps     benign  . Hyperlipidemia   . Hypertension   . GERD (gastroesophageal reflux disease)   . Sleep apnea     on CPAP  . Coronary artery disease     30% stenosis reported on previous cardiac catheterization in 2006. This was done in [facility]  . Chronic kidney disease, stage 3   . Secondary hyperparathyroidism of renal origin     likely     Past Surgical History  Procedure Laterality Date  . Gallbladder surgery    .  Breast biopsy      right; benign  . Appendectomy    . Abdominal hysterectomy      heavy bleeding, ovaries remain  . Cardiovascular stress test  09/2012    WNL ([place])  . Cardiac catheterization  2006    [place], [place].   . Cardiac catheterization  10/2013    mild 2v nonobstructive disease, EF 60%     Family History  Problem Relation Age of Onset  . Arthritis Mother   . Hyperlipidemia Mother   . Stroke Mother   . Hypertension Mother   . Diabetes Mother   . Arthritis Father   . Hyperlipidemia Father   . CAD Father     MI  . Hypertension Father   . Cancer Neg Hx      History   Social History  . Marital Status: Married    Spouse Name: N/A    Number of Children: N/A  . Years of Education: N/A   Occupational History  . Not on file.   Social  History Main Topics  . Smoking status: Never Smoker   . Smokeless tobacco: Never Used     Comment: second hand exposure  . Alcohol Use: No  . Drug Use: No  . Sexual Activity: Not on file   Other Topics Concern  . Not on file   Social History Narrative   Widow, husband Fritz Pickerel passed 30.  Has 3 dogs   No children   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, nutritional   Edu: 1 yr college      G0P0       PHYSICAL EXAM   BP 149/84  Pulse 69  Ht 5\' 6"  (1.676 m)  Wt 197 lb (89.359 kg)  BMI 31.81 kg/m2 Constitutional: She is oriented to person, place, and time. She appears well-developed and well-nourished. No distress.  HENT: No nasal discharge.  Head: Normocephalic and atraumatic.  Eyes: Pupils are equal and round. Right eye exhibits no discharge. Left eye exhibits no discharge.  Neck: Normal range of motion. Neck supple. No JVD present. No thyromegaly present.  Cardiovascular: Normal rate, regular rhythm, normal heart sounds. Exam reveals no gallop and no friction rub. No murmur heard.  Pulmonary/Chest: Effort normal and breath sounds normal. No stridor. No respiratory distress. She has no wheezes. She has no rales. She exhibits no tenderness.  Abdominal: Soft. Bowel sounds are normal. She exhibits no distension. There is no tenderness. There is no rebound and no guarding.  Musculoskeletal: Normal range of motion. She exhibits trace edema and no tenderness.  Neurological: She is alert and oriented to person, place, and time. Coordination normal.  Skin: Skin is warm and dry. No rash noted. She is not diaphoretic. No erythema. No pallor.  Psychiatric: She has a normal mood and affect. Her behavior is normal. Judgment and thought content normal.     EKG: Sinus  Rhythm  -  Nonspecific T-abnormality.   Low voltage -possible pulmonary disease.   ABNORMAL     ASSESSMENT AND PLAN

## 2013-11-15 NOTE — Assessment & Plan Note (Signed)
Continue treatment with atorvastatin with a target LDL of less than 100.  Lab Results  Component Value Date   CHOL 138 06/21/2013   HDL 30.20* 06/21/2013   LDLCALC 70 06/21/2013   LDLDIRECT 92.6 09/17/2012   TRIG 188.0* 06/21/2013   CHOLHDL 5 06/21/2013

## 2013-11-15 NOTE — Assessment & Plan Note (Signed)
She is now using the CPAP machine regularly.

## 2013-11-15 NOTE — Assessment & Plan Note (Signed)
Home blood pressure readings are reasonably controlled. Can consider increasing the dose of losartan.

## 2013-11-15 NOTE — Assessment & Plan Note (Signed)
Recent cardiac catheterization showed mild nonobstructive coronary artery disease for which I recommend treating risk factors. She can followup with me as needed.

## 2013-11-15 NOTE — Patient Instructions (Signed)
Continue same medications.   Follow up as needed.  

## 2013-11-16 ENCOUNTER — Telehealth: Payer: Self-pay | Admitting: Family Medicine

## 2013-11-16 NOTE — Telephone Encounter (Signed)
Reviewed log of blood pressures she drops off - 120-150/60-70s, HR 80s on average . Reasonably well controlled. plz call pt - no changes for now.

## 2013-11-16 NOTE — Telephone Encounter (Signed)
Patient notified

## 2013-11-28 ENCOUNTER — Telehealth: Payer: Self-pay

## 2013-11-28 ENCOUNTER — Other Ambulatory Visit: Payer: Self-pay | Admitting: *Deleted

## 2013-11-28 MED ORDER — INSULIN ASPART PROT & ASPART (70-30 MIX) 100 UNIT/ML PEN: PEN_INJECTOR | SUBCUTANEOUS | Status: DC

## 2013-11-28 MED ORDER — POTASSIUM CHLORIDE CRYS ER 20 MEQ PO TBCR: EXTENDED_RELEASE_TABLET | ORAL | Status: DC

## 2013-11-28 MED ORDER — FUROSEMIDE 20 MG PO TABS: ORAL_TABLET | ORAL | Status: DC

## 2013-11-28 NOTE — Telephone Encounter (Signed)
Pt coming in for appointment on 6/5. Rx sent to pharmacy.

## 2013-11-28 NOTE — Telephone Encounter (Signed)
Received refill request for Novolog Flex pen. Could you look at the script and verify the dosage Thanks!

## 2013-11-28 NOTE — Telephone Encounter (Signed)
Dr. Danise Mina patient

## 2013-11-28 NOTE — Telephone Encounter (Signed)
This has been changed since she last saw me last year.  Please refill x 1 at this dosage.  Ov this week please.

## 2013-11-29 ENCOUNTER — Other Ambulatory Visit: Payer: Self-pay | Admitting: *Deleted

## 2013-11-29 MED ORDER — AMLODIPINE BESYLATE 10 MG PO TABS: 10.0000 mg | ORAL_TABLET | Freq: Every day | ORAL | Status: DC

## 2013-11-29 MED ORDER — ATORVASTATIN CALCIUM 40 MG PO TABS: ORAL_TABLET | ORAL | Status: DC

## 2013-11-29 MED ORDER — OMEPRAZOLE 40 MG PO CPDR: DELAYED_RELEASE_CAPSULE | ORAL | Status: DC

## 2013-11-29 MED ORDER — LEVOTHYROXINE SODIUM 25 MCG PO TABS: 25.0000 ug | ORAL_TABLET | Freq: Every day | ORAL | Status: DC

## 2013-11-29 MED ORDER — METOPROLOL SUCCINATE ER 100 MG PO TB24: ORAL_TABLET | ORAL | Status: DC

## 2013-12-01 ENCOUNTER — Ambulatory Visit (INDEPENDENT_AMBULATORY_CARE_PROVIDER_SITE_OTHER): Payer: Medicare Other | Admitting: Family Medicine

## 2013-12-01 ENCOUNTER — Encounter: Payer: Self-pay | Admitting: Family Medicine

## 2013-12-01 VITALS — BP 150/80 | HR 68 | Temp 98.0°F | Wt 195.5 lb

## 2013-12-01 DIAGNOSIS — I2510 Atherosclerotic heart disease of native coronary artery without angina pectoris: Secondary | ICD-10-CM | POA: Diagnosis not present

## 2013-12-01 DIAGNOSIS — T148 Other injury of unspecified body region: Secondary | ICD-10-CM | POA: Diagnosis not present

## 2013-12-01 DIAGNOSIS — W57XXXA Bitten or stung by nonvenomous insect and other nonvenomous arthropods, initial encounter: Secondary | ICD-10-CM | POA: Diagnosis not present

## 2013-12-01 MED ORDER — FLUCONAZOLE 150 MG PO TABS: 150.0000 mg | ORAL_TABLET | Freq: Once | ORAL | Status: DC

## 2013-12-01 MED ORDER — DOXYCYCLINE HYCLATE 100 MG PO CAPS: 100.0000 mg | ORAL_CAPSULE | Freq: Two times a day (BID) | ORAL | Status: DC

## 2013-12-01 NOTE — Progress Notes (Signed)
Pre visit review using our clinic review tool, if applicable. No additional management support is needed unless otherwise documented below in the visit note. 

## 2013-12-01 NOTE — Assessment & Plan Note (Signed)
Tick bite with tick attached unknown duration and now with sxs concerning for early lyme disease with erythema migrans. Treat with doxy 2wk course - discussed photosensitivity reaction and possible nausea side effect, advised to take with food. Update if not improving as expected or any worsening. Pt agrees with plan.

## 2013-12-01 NOTE — Patient Instructions (Signed)
I am worried about tick borne illness specifically lyme disease. Treat with 2 week course of doxycycline with food. Let us know if not feeing better each day. Diflucan after doxycycline  Lyme Disease You may have been bitten by a tick and are to watch for the development of Lyme Disease. Lyme Disease is an infection that is caused by a bacteria The bacteria causing this disease is named Borreilia burgdorferi. If a tick is infected with this bacteria and then bites you, then Lyme Disease may occur. These ticks are carried by deer and rodents such as rabbits and mice and infest grassy as well as forested areas. Fortunately most tick bites do not cause Lyme Disease.  Lyme Disease is easier to prevent than to treat. First, covering your legs with clothing when walking in areas where ticks are possibly abundant will prevent their attachment because ticks tend to stay within inches of the ground. Second, using insecticides containing DEET can be applied on skin or clothing. Last, because it takes about 12 to 24 hours for the tick to transmit the disease after attachment to the human host, you should inspect your body for ticks twice a day when you are in areas where Lyme Disease is common. You must look thoroughly when searching for ticks. The Ixodes tick that carries Lyme Disease is very small. It is around the size of a sesame seed (picture of tick is not actual size). Removal is best done by grasping the tick by the head and pulling it out. Do not to squeeze the body of the tick. This could inject the infecting bacteria into the bite site. Wash the area of the bite with an antiseptic solution after removal.  Lyme Disease is a disease that may affect many body systems. Because of the small size of the biting tick, most people do not notice being bitten. The first sign of an infection is usually a round red rash that extends out from the center of the tick bite. The center of the lesion may be blood colored  (hemorrhagic) or have tiny blisters (vesicular). Most lesions have bright red outer borders and partial central clearing. This rash may extend out many inches in diameter, and multiple lesions may be present. Other symptoms such as fatigue, headaches, chills and fever, general achiness and swelling of lymph glands may also occur. If this first stage of the disease is left untreated, these symptoms may gradually resolve by themselves, or progressive symptoms may occur because of spread of infection to other areas of the body.  Follow up with your caregiver to have testing and treatment if you have a tick bite and you develop any of the above complaints. Your caregiver may recommend preventative (prophylactic) medications which kill bacteria (antibiotics). Once a diagnosis of Lyme Disease is made, antibiotic treatment is highly likely to cure the disease. Effective treatment of late stage Lyme Disease may require longer courses of antibiotic therapy.  MAKE SURE YOU:   Understand these instructions.  Will watch your condition.  Will get help right away if you are not doing well or get worse. Document Released: 09/22/2000 Document Revised: 09/08/2011 Document Reviewed: 11/24/2008 [facility] Patient Information 2014 [place], [place].

## 2013-12-01 NOTE — Progress Notes (Signed)
BP 150/80  Pulse 68  Temp(Src) 98 F (36.7 C) (Oral)  Wt 195 lb 8 oz (88.678 kg)   CC: tick bite  Subjective:    Patient ID: Julia Banks, female    DOB: 01/08/1941, 73 y.o.   MRN: [id number]  HPI: Julia Banks is a 73 y.o. female presenting on 12/01/2013 for Insect Bite   Tick removed 10/24/2013 from L lateral upper arm - unsure how long it was attached. Started feeling ill on Sunday 11/26/2013 with malaise, feverish/chills, joint aches, fatigue, HA.  Last night noticed rash around tick bite - erythematous, no central clearing. Has not tried any meds for this yet.  BP Readings from Last 3 Encounters:  12/01/13 150/80  11/15/13 149/84  11/01/13 148/78  previously reviewed log of blood pressures she dropped off 2 wks ago - 120-150/60-70s, HR 80s on average. Reasonably well controlled. No changes to med regimen today.   Relevant past medical, surgical, family and social history reviewed and updated as indicated.  Allergies and medications reviewed and updated. Current Outpatient Prescriptions on File Prior to Visit  Medication Sig  . amLODipine (NORVASC) 10 MG tablet Take 1 tablet (10 mg total) by mouth daily. TAKE 1 TABLET (10 MG TOTAL) BY MOUTH DAILY for blood pressure.  Marland Kitchen atorvastatin (LIPITOR) 40 MG tablet TAKE 1 TABLET (40 MG TOTAL) BY MOUTH DAILY.  Marland Kitchen B-D UF III MINI PEN NEEDLES 31G X 5 MM MISC USE 4 TIMES DAILY AS DIRECTED  . furosemide (LASIX) 20 MG tablet TAKE 1/2 TABLET (10 MG TOTAL) BY MOUTH DAILY for blood pressure  . glucose blood (ACCU-CHEK AVIVA PLUS) test strip Use 4/day to check blood sugars.  . Insulin Aspart Prot & Aspart (NOVOLOG MIX 70/30 FLEXPEN) (70-30) 100 UNIT/ML Pen Inject 67 units every morning and 42 units every evening.  Marland Kitchen levothyroxine (LEVOTHROID) 25 MCG tablet Take 1 tablet (25 mcg total) by mouth daily before breakfast.  . losartan (COZAAR) 50 MG tablet Take 1 tablet (50 mg total) by mouth daily. For blood pressure  . metoprolol succinate  (TOPROL-XL) 100 MG 24 hr tablet TAKE 1 TABLET BY MOUTH DAILY. TAKE WITH OR IMMEDIATELY FOLLOWING A MEAL.  Marland Kitchen omeprazole (PRILOSEC) 40 MG capsule TAKE 1 CAPSULE (40 MG TOTAL) BY MOUTH DAILY AS NEEDED.  Marland Kitchen potassium chloride SA (KLOR-CON M20) 20 MEQ tablet TAKE 1 TABLET (20 MEQ TOTAL) BY MOUTH DAILY.   No current facility-administered medications on file prior to visit.    Review of Systems Per HPI unless specifically indicated above    Objective:    BP 150/80  Pulse 68  Temp(Src) 98 F (36.7 C) (Oral)  Wt 195 lb 8 oz (88.678 kg)  Physical Exam  Nursing note and vitals reviewed. Constitutional: She appears well-developed and well-nourished. No distress.  Cardiovascular: Normal rate, regular rhythm, normal heart sounds and intact distal pulses.   No murmur heard. Pulmonary/Chest: Effort normal and breath sounds normal. No respiratory distress. She has no wheezes. She has no rales.  Musculoskeletal: She exhibits no edema.  No active synovitis but tender to palpation of DIP/PIP of bilateral 2nd and 3rd digits  Skin: Skin is warm and dry. Rash noted.  Erythematous rash extending about 4cm around L lateral shoulder site of tick bite, no central clearing. Mildly pruritic       Assessment & Plan:   Problem List Items Addressed This Visit   Tick bite - Primary     Tick bite with tick attached unknown duration and now with  sxs concerning for early lyme disease with erythema migrans. Treat with doxy 2wk course - discussed photosensitivity reaction and possible nausea side effect, advised to take with food. Update if not improving as expected or any worsening. Pt agrees with plan.        Follow up plan: Return if symptoms worsen or fail to improve.

## 2013-12-02 ENCOUNTER — Encounter: Payer: Self-pay | Admitting: Endocrinology

## 2013-12-02 ENCOUNTER — Ambulatory Visit (INDEPENDENT_AMBULATORY_CARE_PROVIDER_SITE_OTHER): Payer: Medicare Other | Admitting: Endocrinology

## 2013-12-02 ENCOUNTER — Ambulatory Visit
Admission: RE | Admit: 2013-12-02 | Discharge: 2013-12-02 | Disposition: A | Discharge status: Home / Self Care | Payer: Medicare Other | Source: Ambulatory Visit | Attending: Obstetrics and Gynecology | Admitting: Obstetrics and Gynecology

## 2013-12-02 VITALS — BP 122/60 | HR 64 | Temp 97.7°F | Ht 66.0 in | Wt 197.0 lb

## 2013-12-02 DIAGNOSIS — E1142 Type 2 diabetes mellitus with diabetic polyneuropathy: Secondary | ICD-10-CM | POA: Diagnosis not present

## 2013-12-02 DIAGNOSIS — I2510 Atherosclerotic heart disease of native coronary artery without angina pectoris: Secondary | ICD-10-CM | POA: Diagnosis not present

## 2013-12-02 DIAGNOSIS — E1140 Type 2 diabetes mellitus with diabetic neuropathy, unspecified: Secondary | ICD-10-CM

## 2013-12-02 DIAGNOSIS — R921 Mammographic calcification found on diagnostic imaging of breast: Secondary | ICD-10-CM

## 2013-12-02 DIAGNOSIS — E1165 Type 2 diabetes mellitus with hyperglycemia: Principal | ICD-10-CM

## 2013-12-02 DIAGNOSIS — E1149 Type 2 diabetes mellitus with other diabetic neurological complication: Secondary | ICD-10-CM

## 2013-12-02 DIAGNOSIS — IMO0002 Reserved for concepts with insufficient information to code with codable children: Secondary | ICD-10-CM

## 2013-12-02 DIAGNOSIS — R928 Other abnormal and inconclusive findings on diagnostic imaging of breast: Secondary | ICD-10-CM | POA: Diagnosis not present

## 2013-12-02 LAB — MICROALBUMIN / CREATININE URINE RATIO
Creatinine,U: 93.5 mg/dL
Microalb Creat Ratio: 1.4 mg/g (ref 0.0–30.0)
Microalb, Ur: 1.3 mg/dL (ref 0.0–1.9)

## 2013-12-02 NOTE — Progress Notes (Signed)
Subjective:    Patient ID: Julia Banks, female    DOB: 1941-02-26, 73 y.o.   MRN: [id number]  HPI Pt returns for f/u of insulin-requiring DM (dx'ed 7416; complicated by moderate sensory neuropathy of the lower extremities; she has associated CAD; she has been on insulin since 2000; she has never had pancreatitis, severe hypoglycemia, or DKA).  She does not know how much insulin she takes, but she says 67 am and 42 pm "sounds right."  no cbg record, but states cbg's are variable. Past Medical History  Diagnosis Date  . Asthma   . Arthritis   . Headaches, cluster   . Type 2 diabetes, uncontrolled, with neuropathy   . Colon polyps     benign  . Hyperlipidemia   . Hypertension   . GERD (gastroesophageal reflux disease)   . Sleep apnea     on CPAP  . Coronary artery disease     30% stenosis reported on previous cardiac catheterization in 2006. This was done in [facility]  . Chronic kidney disease, stage 3   . Secondary hyperparathyroidism of renal origin     likely    Past Surgical History  Procedure Laterality Date  . Gallbladder surgery    . Breast biopsy      right; benign  . Appendectomy    . Abdominal hysterectomy      heavy bleeding, ovaries remain  . Cardiovascular stress test  09/2012    [facility] ([place])  . Cardiac catheterization  2006    [place], [place].   . Cardiac catheterization  10/2013    mild 2v nonobstructive disease, EF 60%    History   Social History  . Marital Status: Married    Spouse Name: N/A    Number of Children: N/A  . Years of Education: N/A   Occupational History  . Not on file.   Social History Main Topics  . Smoking status: Never Smoker   . Smokeless tobacco: Never Used     Comment: second hand exposure  . Alcohol Use: No  . Drug Use: No  . Sexual Activity: Not on file   Other Topics Concern  . Not on file   Social History Narrative   Widow, husband Fritz Pickerel passed 17.  Has 3 dogs   No children   Occupation:  Retired, worked for [employer], [employer]   Edu: 1 yr college      [id number]    Current Outpatient Prescriptions on File Prior to Visit  Medication Sig Dispense Refill  . amLODipine (NORVASC) 10 MG tablet Take 1 tablet (10 mg total) by mouth daily. TAKE 1 TABLET (10 MG TOTAL) BY MOUTH DAILY for blood pressure.  90 tablet  1  . atorvastatin (LIPITOR) 40 MG tablet TAKE 1 TABLET (40 MG TOTAL) BY MOUTH DAILY.  90 tablet  1  . B-D UF III MINI PEN NEEDLES 31G X 5 MM MISC USE 4 TIMES DAILY AS DIRECTED  200 each  5  . doxycycline (VIBRAMYCIN) 100 MG capsule Take 1 capsule (100 mg total) by mouth 2 (two) times daily.  28 capsule  0  . fluconazole (DIFLUCAN) 150 MG tablet Take 1 tablet (150 mg total) by mouth once.  1 tablet  0  . furosemide (LASIX) 20 MG tablet TAKE 1/2 TABLET (10 MG TOTAL) BY MOUTH DAILY for blood pressure  30 tablet  6  . glucose blood (ACCU-CHEK AVIVA PLUS) test strip Use 4/day to check blood sugars.  150 each  2  . levothyroxine (LEVOTHROID) 25 MCG tablet Take 1 tablet (25 mcg total) by mouth daily before breakfast.  90 tablet  1  . losartan (COZAAR) 50 MG tablet Take 1 tablet (50 mg total) by mouth daily. For blood pressure  90 tablet  3  . metoprolol succinate (TOPROL-XL) 100 MG 24 hr tablet TAKE 1 TABLET BY MOUTH DAILY. TAKE WITH OR IMMEDIATELY FOLLOWING A MEAL.  90 tablet  1  . omeprazole (PRILOSEC) 40 MG capsule TAKE 1 CAPSULE (40 MG TOTAL) BY MOUTH DAILY AS NEEDED.  90 capsule  1  . potassium chloride SA (KLOR-CON M20) 20 MEQ tablet TAKE 1 TABLET (20 MEQ TOTAL) BY MOUTH DAILY.  30 tablet  6   No current facility-administered medications on file prior to visit.    Allergies  Allergen Reactions  . Altace [Ramipril] Other (See Comments)    Upset stomach  . Aspirin   . Metformin And Related Other (See Comments)    GI upset    Family History  Problem Relation Age of Onset  . Arthritis Mother   . Hyperlipidemia Mother   . Stroke Mother   .  Hypertension Mother   . Diabetes Mother   . Arthritis Father   . Hyperlipidemia Father   . CAD Father     MI  . Hypertension Father   . Cancer Neg Hx     BP 122/60  Pulse 64  Temp(Src) 97.7 F (36.5 C) (Oral)  Ht 5\' 6"  (1.676 m)  Wt 197 lb (89.359 kg)  BMI 31.81 kg/m2  SpO2 98%   Review of Systems She denies hypoglycemia.  She has lost a few lbs.      Objective:   Physical Exam Pulses: dorsalis pedis intact bilat.   Feet: no deformity. normal color and temp.  no edema Skin:  no ulcer on the feet.   Neuro: sensation is intact to touch on the feet, but decreased from normal.   Lab Results  Component Value Date   HGBA1C 8.5* 10/25/2013       Assessment & Plan:  DM: moderate exacerbation. Noncompliance with cbg recording and insulin dosing: I'll work around this as best I can  Patient is advised the following: Patient Instructions  Please verify you are taking 67 units with breakfast, and 42 units with the evening meal.  If that is right, please increase to 75 units with breakfast, and 45 units with the evening meal.  Please come back for a follow-up appointment in 6 weeks.  check your blood sugar twice a day.  vary the time of day when you check, between before the 3 meals, and at bedtime.  also check if you have symptoms of your blood sugar being too high or too low.  please keep a record of the readings and bring it to your next appointment here.  You can write it on any piece of paper.  please call us sooner if your blood sugar goes below 70, or if you have a lot of readings over 200.   A urine test is requested for you today.  We'll contact you with results.

## 2013-12-02 NOTE — Patient Instructions (Addendum)
Please verify you are taking 67 units with breakfast, and 42 units with the evening meal.  If that is right, please increase to 75 units with breakfast, and 45 units with the evening meal.  Please come back for a follow-up appointment in 6 weeks.  check your blood sugar twice a day.  vary the time of day when you check, between before the 3 meals, and at bedtime.  also check if you have symptoms of your blood sugar being too high or too low.  please keep a record of the readings and bring it to your next appointment here.  You can write it on any piece of paper.  please call us sooner if your blood sugar goes below 70, or if you have a lot of readings over 200.   A urine test is requested for you today.  We'll contact you with results.

## 2013-12-27 ENCOUNTER — Other Ambulatory Visit: Payer: Self-pay | Admitting: Endocrinology

## 2014-02-07 DIAGNOSIS — D239 Other benign neoplasm of skin, unspecified: Secondary | ICD-10-CM | POA: Diagnosis not present

## 2014-02-07 DIAGNOSIS — L719 Rosacea, unspecified: Secondary | ICD-10-CM | POA: Diagnosis not present

## 2014-02-07 DIAGNOSIS — Z872 Personal history of diseases of the skin and subcutaneous tissue: Secondary | ICD-10-CM | POA: Diagnosis not present

## 2014-02-07 DIAGNOSIS — Z1283 Encounter for screening for malignant neoplasm of skin: Secondary | ICD-10-CM | POA: Diagnosis not present

## 2014-02-22 ENCOUNTER — Other Ambulatory Visit: Payer: Self-pay | Admitting: Family Medicine

## 2014-02-22 DIAGNOSIS — E1165 Type 2 diabetes mellitus with hyperglycemia: Principal | ICD-10-CM

## 2014-02-22 DIAGNOSIS — IMO0002 Reserved for concepts with insufficient information to code with codable children: Secondary | ICD-10-CM

## 2014-02-22 DIAGNOSIS — E1140 Type 2 diabetes mellitus with diabetic neuropathy, unspecified: Secondary | ICD-10-CM

## 2014-02-24 ENCOUNTER — Ambulatory Visit (INDEPENDENT_AMBULATORY_CARE_PROVIDER_SITE_OTHER): Payer: Medicare Other | Admitting: Family Medicine

## 2014-02-24 ENCOUNTER — Encounter: Payer: Self-pay | Admitting: Family Medicine

## 2014-02-24 VITALS — BP 136/72 | HR 60 | Temp 98.1°F | Wt 188.8 lb

## 2014-02-24 DIAGNOSIS — I2510 Atherosclerotic heart disease of native coronary artery without angina pectoris: Secondary | ICD-10-CM | POA: Diagnosis not present

## 2014-02-24 DIAGNOSIS — E1142 Type 2 diabetes mellitus with diabetic polyneuropathy: Secondary | ICD-10-CM

## 2014-02-24 DIAGNOSIS — N183 Chronic kidney disease, stage 3 unspecified: Secondary | ICD-10-CM

## 2014-02-24 DIAGNOSIS — E1140 Type 2 diabetes mellitus with diabetic neuropathy, unspecified: Secondary | ICD-10-CM

## 2014-02-24 DIAGNOSIS — E1129 Type 2 diabetes mellitus with other diabetic kidney complication: Secondary | ICD-10-CM

## 2014-02-24 DIAGNOSIS — F4321 Adjustment disorder with depressed mood: Secondary | ICD-10-CM

## 2014-02-24 DIAGNOSIS — I10 Essential (primary) hypertension: Secondary | ICD-10-CM | POA: Diagnosis not present

## 2014-02-24 DIAGNOSIS — E1149 Type 2 diabetes mellitus with other diabetic neurological complication: Secondary | ICD-10-CM | POA: Diagnosis not present

## 2014-02-24 DIAGNOSIS — E1165 Type 2 diabetes mellitus with hyperglycemia: Principal | ICD-10-CM

## 2014-02-24 DIAGNOSIS — E1122 Type 2 diabetes mellitus with diabetic chronic kidney disease: Secondary | ICD-10-CM

## 2014-02-24 DIAGNOSIS — IMO0002 Reserved for concepts with insufficient information to code with codable children: Secondary | ICD-10-CM

## 2014-02-24 LAB — BASIC METABOLIC PANEL
BUN: 12 mg/dL (ref 6–23)
CO2: 28 mEq/L (ref 19–32)
CREATININE: 1.4 mg/dL — AB (ref 0.4–1.2)
Calcium: 9.3 mg/dL (ref 8.4–10.5)
Chloride: 103 mEq/L (ref 96–112)
GFR: 40.06 mL/min — AB (ref >60.00)
Glucose, Bld: 169 mg/dL — ABNORMAL HIGH (ref 70–99)
Potassium: 4.1 mEq/L (ref 3.5–5.1)
SODIUM: 138 meq/L (ref 135–145)

## 2014-02-24 LAB — HEMOGLOBIN A1C: Hgb A1c MFr Bld: 9.5 % — ABNORMAL HIGH (ref 4.6–6.5)

## 2014-02-24 NOTE — Assessment & Plan Note (Signed)
Chronic, stable. Improved #s. Continue regimen.

## 2014-02-24 NOTE — Progress Notes (Signed)
BP 136/72  Pulse 60  Temp(Src) 98.1 F (36.7 C) (Oral)  Wt 188 lb 12 oz (85.616 kg)   CC: 4 mo f/u  Subjective:    Patient ID: Julia Banks, female    DOB: 12/31/1940, 73 y.o.   MRN: [id number]  HPI: Julia Banks is a 73 y.o. female presenting on 02/24/2014 for Follow-up   HTN - better controlled blood pressure today. compliant with losartan 50mg  and amlodipine 10mg , and lasix 10mg  daily, toprol xl 100mg  daily. No vision changes, CP/tightness, SOB, leg swelling. Occasional headaches.  DM - follows with Dr. Loanne Drilling next appt in 6 wks. Diabetic eye exam 05/2013. Fasting sugars much better controlled - low 100s.  Compliant with novolog 70/30 67u in am and 42u at night. Lowest CBG 80. Foot exam today.  Wt Readings from Last 3 Encounters:  02/24/14 188 lb 12 oz (85.616 kg)  12/02/13 197 lb (89.359 kg)  12/01/13 195 lb 8 oz (88.678 kg)   weight loss noted since husband's passing. Decreased appetite. Usually eats lunch and dinner. Grabs 1/2 bagel in am when she takes novolog 70/30 in am.   Relevant past medical, surgical, family and social history reviewed and updated as indicated.  Allergies and medications reviewed and updated. Current Outpatient Prescriptions on File Prior to Visit  Medication Sig  . amLODipine (NORVASC) 10 MG tablet Take 1 tablet (10 mg total) by mouth daily. TAKE 1 TABLET (10 MG TOTAL) BY MOUTH DAILY for blood pressure.  Marland Kitchen atorvastatin (LIPITOR) 40 MG tablet TAKE 1 TABLET (40 MG TOTAL) BY MOUTH DAILY.  Marland Kitchen B-D UF III MINI PEN NEEDLES 31G X 5 MM MISC USE 4 TIMES DAILY AS DIRECTED  . furosemide (LASIX) 20 MG tablet TAKE 1/2 TABLET (10 MG TOTAL) BY MOUTH DAILY for blood pressure  . glucose blood (ACCU-CHEK AVIVA PLUS) test strip Use 4/day to check blood sugars.  Marland Kitchen levothyroxine (LEVOTHROID) 25 MCG tablet Take 1 tablet (25 mcg total) by mouth daily before breakfast.  . losartan (COZAAR) 50 MG tablet Take 1 tablet (50 mg total) by mouth daily. For blood pressure  .  metoprolol succinate (TOPROL-XL) 100 MG 24 hr tablet TAKE 1 TABLET BY MOUTH DAILY. TAKE WITH OR IMMEDIATELY FOLLOWING A MEAL.  Marland Kitchen NOVOLOG MIX 70/30 FLEXPEN (70-30) 100 UNIT/ML Pen INJECT 67 UNITS EVERY MORNING AND 42 UNITS EVERY EVENING.  Marland Kitchen omeprazole (PRILOSEC) 40 MG capsule TAKE 1 CAPSULE (40 MG TOTAL) BY MOUTH DAILY AS NEEDED.  Marland Kitchen potassium chloride SA (KLOR-CON M20) 20 MEQ tablet TAKE 1 TABLET (20 MEQ TOTAL) BY MOUTH DAILY.   No current facility-administered medications on file prior to visit.    Review of Systems Per HPI unless specifically indicated above    Objective:    BP 136/72  Pulse 60  Temp(Src) 98.1 F (36.7 C) (Oral)  Wt 188 lb 12 oz (85.616 kg)  Physical Exam  Nursing note and vitals reviewed. Constitutional: She appears well-developed and well-nourished. No distress.  HENT:  Mouth/Throat: Oropharynx is clear and moist. No oropharyngeal exudate.  Cardiovascular: Normal rate, regular rhythm, normal heart sounds and intact distal pulses.   No murmur heard. Pulmonary/Chest: Effort normal and breath sounds normal. No respiratory distress. She has no wheezes. She has no rales.  Musculoskeletal: She exhibits no edema.  Diabetic foot exam: Normal inspection No skin breakdown No calluses  Normal DP pulses Normal sensation to light touch Diminished to monofilament on right foot Nails normal  Skin: Skin is warm and dry. No rash noted.  Psychiatric: She has a normal mood and affect.   Results for orders placed in visit on 12/02/13  MICROALBUMIN / CREATININE URINE RATIO      Result Value Ref Range   Microalb, Ur 1.3  0.0 - 1.9 mg/dL   Creatinine,U 93.5     Microalb Creat Ratio 1.4  0.0 - 30.0 mg/g      Assessment & Plan:   Problem List Items Addressed This Visit   Diabetes mellitus with stage 3 chronic kidney disease   Essential hypertension, benign     Chronic, stable. Improved #s. Continue regimen.    Grieving     Continued grieving. Weight loss noted. Just  at 7 month mark. Continue to monitor, provide support    Type 2 diabetes, uncontrolled, with neuropathy - Primary     Recheck A1c today. Foot exam today. Has f/u with endo in 6 wks. Continue novolog 70/30 67/42units as no endorsed low sugars despite weight loss noted.    Relevant Orders      Basic metabolic panel      Hemoglobin A1c      HM DIABETES FOOT EXAM (Completed)       Follow up plan: Return in about 4 months (around 06/26/2014), or as needed, for medicare wellenss.

## 2014-02-24 NOTE — Assessment & Plan Note (Signed)
Continued grieving. Weight loss noted. Just at 7 month mark. Continue to monitor, provide support

## 2014-02-24 NOTE — Patient Instructions (Addendum)
Blood work today. Watch sugars with recent weight loss - as we want to avoid low sugars. Watch feet especially right foot every day. Blood pressures are looking better. Good to see you today, call us with questions. Return in 4 months for medicare wellness visit.

## 2014-02-24 NOTE — Assessment & Plan Note (Signed)
Recheck A1c today. Foot exam today. Has f/u with endo in 6 wks. Continue novolog 70/30 67/42units as no endorsed low sugars despite weight loss noted.

## 2014-02-24 NOTE — Progress Notes (Signed)
Pre visit review using our clinic review tool, if applicable. No additional management support is needed unless otherwise documented below in the visit note. 

## 2014-02-27 ENCOUNTER — Other Ambulatory Visit: Payer: Self-pay | Admitting: Endocrinology

## 2014-02-28 ENCOUNTER — Other Ambulatory Visit: Payer: Self-pay | Admitting: Obstetrics and Gynecology

## 2014-02-28 DIAGNOSIS — C50912 Malignant neoplasm of unspecified site of left female breast: Secondary | ICD-10-CM

## 2014-02-28 DIAGNOSIS — R921 Mammographic calcification found on diagnostic imaging of breast: Secondary | ICD-10-CM

## 2014-02-28 HISTORY — DX: Malignant neoplasm of unspecified site of left female breast: C50.912

## 2014-02-28 HISTORY — PX: BREAST BIOPSY: SHX20

## 2014-03-13 ENCOUNTER — Other Ambulatory Visit: Payer: Self-pay | Admitting: Obstetrics and Gynecology

## 2014-03-13 DIAGNOSIS — N63 Unspecified lump in unspecified breast: Secondary | ICD-10-CM

## 2014-03-13 DIAGNOSIS — Z01419 Encounter for gynecological examination (general) (routine) without abnormal findings: Secondary | ICD-10-CM | POA: Diagnosis not present

## 2014-03-13 DIAGNOSIS — Z1212 Encounter for screening for malignant neoplasm of rectum: Secondary | ICD-10-CM | POA: Diagnosis not present

## 2014-03-17 ENCOUNTER — Ambulatory Visit
Admission: RE | Admit: 2014-03-17 | Discharge: 2014-03-17 | Disposition: A | Discharge status: Home / Self Care | Payer: Medicare Other | Source: Ambulatory Visit | Attending: Obstetrics and Gynecology | Admitting: Obstetrics and Gynecology

## 2014-03-17 ENCOUNTER — Other Ambulatory Visit: Payer: Self-pay | Admitting: Obstetrics and Gynecology

## 2014-03-17 DIAGNOSIS — N63 Unspecified lump in unspecified breast: Secondary | ICD-10-CM | POA: Diagnosis not present

## 2014-03-17 DIAGNOSIS — N6310 Unspecified lump in the right breast, unspecified quadrant: Secondary | ICD-10-CM

## 2014-03-17 DIAGNOSIS — N6320 Unspecified lump in the left breast, unspecified quadrant: Secondary | ICD-10-CM

## 2014-03-21 ENCOUNTER — Other Ambulatory Visit: Payer: Self-pay | Admitting: Endocrinology

## 2014-03-24 ENCOUNTER — Ambulatory Visit
Admission: RE | Admit: 2014-03-24 | Discharge: 2014-03-24 | Disposition: A | Discharge status: Home / Self Care | Payer: Medicare Other | Source: Ambulatory Visit | Attending: Obstetrics and Gynecology | Admitting: Obstetrics and Gynecology

## 2014-03-24 DIAGNOSIS — N6320 Unspecified lump in the left breast, unspecified quadrant: Secondary | ICD-10-CM

## 2014-03-24 DIAGNOSIS — N63 Unspecified lump in unspecified breast: Secondary | ICD-10-CM | POA: Diagnosis not present

## 2014-03-24 DIAGNOSIS — D0590 Unspecified type of carcinoma in situ of unspecified breast: Secondary | ICD-10-CM | POA: Diagnosis not present

## 2014-03-24 DIAGNOSIS — N6310 Unspecified lump in the right breast, unspecified quadrant: Secondary | ICD-10-CM

## 2014-03-27 ENCOUNTER — Other Ambulatory Visit: Payer: Self-pay | Admitting: Obstetrics and Gynecology

## 2014-03-27 DIAGNOSIS — C50912 Malignant neoplasm of unspecified site of left female breast: Secondary | ICD-10-CM

## 2014-03-27 DIAGNOSIS — D0512 Intraductal carcinoma in situ of left breast: Secondary | ICD-10-CM

## 2014-03-28 ENCOUNTER — Encounter: Payer: Self-pay | Admitting: Family Medicine

## 2014-03-28 ENCOUNTER — Telehealth: Payer: Self-pay | Admitting: *Deleted

## 2014-03-28 DIAGNOSIS — C50412 Malignant neoplasm of upper-outer quadrant of left female breast: Secondary | ICD-10-CM | POA: Insufficient documentation

## 2014-03-28 NOTE — Telephone Encounter (Signed)
Left vm for pt to return call to get further information on [facility] on 04/05/14./ [facility] confirmed appt with pt.

## 2014-03-29 ENCOUNTER — Encounter: Payer: Self-pay | Admitting: Family Medicine

## 2014-03-30 ENCOUNTER — Telehealth: Payer: Self-pay | Admitting: *Deleted

## 2014-03-30 NOTE — Telephone Encounter (Signed)
Left detailed message on personal vm concerning appt for [facility] on 04/05/14 at 0800.

## 2014-04-03 ENCOUNTER — Other Ambulatory Visit: Payer: Self-pay | Admitting: Obstetrics and Gynecology

## 2014-04-03 DIAGNOSIS — R921 Mammographic calcification found on diagnostic imaging of breast: Secondary | ICD-10-CM

## 2014-04-04 ENCOUNTER — Ambulatory Visit
Admission: RE | Admit: 2014-04-04 | Discharge: 2014-04-04 | Disposition: A | Discharge status: Home / Self Care | Payer: Medicare Other | Source: Ambulatory Visit | Attending: Obstetrics and Gynecology | Admitting: Obstetrics and Gynecology

## 2014-04-04 ENCOUNTER — Other Ambulatory Visit: Payer: Self-pay | Admitting: Obstetrics and Gynecology

## 2014-04-04 DIAGNOSIS — R921 Mammographic calcification found on diagnostic imaging of breast: Secondary | ICD-10-CM

## 2014-04-04 DIAGNOSIS — D0512 Intraductal carcinoma in situ of left breast: Secondary | ICD-10-CM

## 2014-04-04 DIAGNOSIS — Z01818 Encounter for other preprocedural examination: Secondary | ICD-10-CM | POA: Diagnosis not present

## 2014-04-04 MED ORDER — GADOBENATE DIMEGLUMINE 529 MG/ML IV SOLN
9.0000 mL | Freq: Once | INTRAVENOUS | Status: AC | PRN
  Administered 2014-04-04: 9 mL via INTRAVENOUS

## 2014-04-05 ENCOUNTER — Encounter: Payer: Self-pay | Admitting: Hematology and Oncology

## 2014-04-05 ENCOUNTER — Ambulatory Visit: Payer: Medicare Other | Attending: General Surgery | Admitting: Physical Therapy

## 2014-04-05 ENCOUNTER — Ambulatory Visit: Payer: PRIVATE HEALTH INSURANCE

## 2014-04-05 ENCOUNTER — Other Ambulatory Visit (HOSPITAL_BASED_OUTPATIENT_CLINIC_OR_DEPARTMENT_OTHER): Payer: Medicare Other

## 2014-04-05 ENCOUNTER — Ambulatory Visit
Admission: RE | Admit: 2014-04-05 | Discharge: 2014-04-05 | Disposition: A | Discharge status: Home / Self Care | Payer: PRIVATE HEALTH INSURANCE | Source: Ambulatory Visit | Attending: Radiation Oncology | Admitting: Radiation Oncology

## 2014-04-05 ENCOUNTER — Ambulatory Visit (HOSPITAL_BASED_OUTPATIENT_CLINIC_OR_DEPARTMENT_OTHER): Payer: Medicare Other | Admitting: Hematology and Oncology

## 2014-04-05 VITALS — BP 163/70 | HR 63 | Temp 97.9°F | Resp 19 | Ht 66.0 in | Wt 193.7 lb

## 2014-04-05 DIAGNOSIS — J45998 Other asthma: Secondary | ICD-10-CM | POA: Diagnosis not present

## 2014-04-05 DIAGNOSIS — Z853 Personal history of malignant neoplasm of breast: Secondary | ICD-10-CM | POA: Diagnosis not present

## 2014-04-05 DIAGNOSIS — I2510 Atherosclerotic heart disease of native coronary artery without angina pectoris: Secondary | ICD-10-CM | POA: Diagnosis not present

## 2014-04-05 DIAGNOSIS — C50412 Malignant neoplasm of upper-outer quadrant of left female breast: Secondary | ICD-10-CM | POA: Diagnosis not present

## 2014-04-05 DIAGNOSIS — M25612 Stiffness of left shoulder, not elsewhere classified: Secondary | ICD-10-CM | POA: Diagnosis not present

## 2014-04-05 DIAGNOSIS — R921 Mammographic calcification found on diagnostic imaging of breast: Secondary | ICD-10-CM

## 2014-04-05 DIAGNOSIS — I10 Essential (primary) hypertension: Secondary | ICD-10-CM | POA: Insufficient documentation

## 2014-04-05 DIAGNOSIS — E119 Type 2 diabetes mellitus without complications: Secondary | ICD-10-CM | POA: Insufficient documentation

## 2014-04-05 DIAGNOSIS — N183 Chronic kidney disease, stage 3 (moderate): Secondary | ICD-10-CM | POA: Diagnosis not present

## 2014-04-05 DIAGNOSIS — M25611 Stiffness of right shoulder, not elsewhere classified: Secondary | ICD-10-CM | POA: Diagnosis not present

## 2014-04-05 DIAGNOSIS — Z5189 Encounter for other specified aftercare: Secondary | ICD-10-CM | POA: Diagnosis not present

## 2014-04-05 DIAGNOSIS — E039 Hypothyroidism, unspecified: Secondary | ICD-10-CM | POA: Insufficient documentation

## 2014-04-05 DIAGNOSIS — C50919 Malignant neoplasm of unspecified site of unspecified female breast: Secondary | ICD-10-CM | POA: Insufficient documentation

## 2014-04-05 DIAGNOSIS — Z170 Estrogen receptor positive status [ER+]: Secondary | ICD-10-CM | POA: Diagnosis not present

## 2014-04-05 DIAGNOSIS — N63 Unspecified lump in breast: Secondary | ICD-10-CM | POA: Diagnosis not present

## 2014-04-05 DIAGNOSIS — Z90710 Acquired absence of both cervix and uterus: Secondary | ICD-10-CM | POA: Diagnosis not present

## 2014-04-05 DIAGNOSIS — G4733 Obstructive sleep apnea (adult) (pediatric): Secondary | ICD-10-CM | POA: Diagnosis not present

## 2014-04-05 DIAGNOSIS — R293 Abnormal posture: Secondary | ICD-10-CM | POA: Diagnosis not present

## 2014-04-05 DIAGNOSIS — E1122 Type 2 diabetes mellitus with diabetic chronic kidney disease: Secondary | ICD-10-CM | POA: Diagnosis not present

## 2014-04-05 LAB — CBC WITH DIFFERENTIAL/PLATELET
BASO%: 0.7 % (ref 0.0–2.0)
Basophils Absolute: 0.1 10*3/uL (ref 0.0–0.1)
EOS%: 4.1 % (ref 0.0–7.0)
Eosinophils Absolute: 0.3 10*3/uL (ref 0.0–0.5)
HEMATOCRIT: 39 % (ref 34.8–46.6)
HGB: 13 g/dL (ref 11.6–15.9)
LYMPH%: 28.9 % (ref 14.0–49.7)
MCH: 29 pg (ref 25.1–34.0)
MCHC: 33.5 g/dL (ref 31.5–36.0)
MCV: 86.7 fL (ref 79.5–101.0)
MONO#: 0.5 10*3/uL (ref 0.1–0.9)
MONO%: 6.7 % (ref 0.0–14.0)
NEUT#: 4.4 10*3/uL (ref 1.5–6.5)
NEUT%: 59.6 % (ref 38.4–76.8)
PLATELETS: 176 10*3/uL (ref 145–400)
RBC: 4.5 10*6/uL (ref 3.70–5.45)
RDW: 14.1 % (ref 11.2–14.5)
WBC: 7.5 10*3/uL (ref 3.9–10.3)
lymph#: 2.2 10*3/uL (ref 0.9–3.3)

## 2014-04-05 LAB — COMPREHENSIVE METABOLIC PANEL (CC13)
ALK PHOS: 124 U/L (ref 40–150)
ALT: 16 U/L (ref 0–55)
ANION GAP: 10 meq/L (ref 3–11)
AST: 14 U/L (ref 5–34)
Albumin: 4.2 g/dL (ref 3.5–5.0)
BUN: 20.4 mg/dL (ref 7.0–26.0)
CO2: 24 meq/L (ref 22–29)
CREATININE: 1.3 mg/dL — AB (ref 0.6–1.1)
Calcium: 10 mg/dL (ref 8.4–10.4)
Chloride: 106 mEq/L (ref 98–109)
Glucose: 171 mg/dl — ABNORMAL HIGH (ref 70–140)
Potassium: 4.3 mEq/L (ref 3.5–5.1)
Sodium: 139 mEq/L (ref 136–145)
Total Bilirubin: 0.58 mg/dL (ref 0.20–1.20)
Total Protein: 7.5 g/dL (ref 6.4–8.3)

## 2014-04-05 NOTE — Progress Notes (Signed)
[facility] NEW PATIENT EVALUATION  Name: Elmo Shumard MRN: [id number]  Date:   04/05/2014           DOB: 10-31-1940  Status: outpatient   CC: Ria Bush, MD  Fanny Skates, MD    REFERRING PHYSICIAN: Fanny Skates, MD Dr. Dian Queen   DIAGNOSIS: Stage 0 (Tis N0 M0) DCIS of the left breast   HISTORY OF PRESENT ILLNESS:  Julia Banks is a 73 y.o. female who is seen today through the courtesy of Dr. Dalbert Batman at the [facility] for evaluation of her DCIS of the left breast. She presented with a palpable mass along the upper-outer quadrant of her left breast. This was initially noted by her gynecologist, Dr. Dian Queen. She underwent diagnostic left mammography at the [facility] on September 25,  and she was found to have a suspicious left breast mass at 2:00. Left breast ultrasound showed a 2.4 x 1.6 x 1.3 cm mass at 2:00, 5 cm from the nipple. Biopsy was diagnostic for DCIS with papillary features. This was felt to be low-grade. Her DCIS was ER positive at 93% and PR negative. Breast MR on 04/04/2014 showed a 5.3 x 2.9 x 2.3 cm area of enhancement at approximately 2:30 along the upper-outer quadrant of the left breast. Right breast mammogram on 04/04/2014 showed an indeterminate group of calcifications within the upper inner quadrant of the right breast for which a biopsy is scheduled tomorrow. She is without complaints today.  PREVIOUS RADIATION THERAPY: No   PAST MEDICAL HISTORY:  has a past medical history of Asthma; Arthritis; Headaches, cluster; Type 2 diabetes, uncontrolled, with neuropathy; Colon polyps; Hyperlipidemia; Hypertension; GERD (gastroesophageal reflux disease); Sleep apnea; Coronary artery disease; Chronic kidney disease, stage 3; Secondary hyperparathyroidism of renal origin; Breast cancer, left breast (02/2014); and Thyroid disease.     PAST SURGICAL HISTORY:  Past Surgical History  Procedure Laterality Date  .  Gallbladder surgery    . Breast biopsy Right     benign  . Appendectomy    . Abdominal hysterectomy      heavy bleeding, ovaries remain  . Cardiovascular stress test  09/2012    WNL ([facility])  . Cardiac catheterization  2006    [place], [place].   . Cardiac catheterization  10/2013    mild 2v nonobstructive disease, EF 60%  . Breast biopsy Left 02/2014    ?DCIS  . Cholecystectomy       FAMILY HISTORY: family history includes Arthritis in her father and mother; Breast cancer in her other; CAD in her father; Colon cancer in her other; Diabetes in her mother; Hyperlipidemia in her father and mother; Hypertension in her father and mother; Stroke in her mother. There is no history of Cancer.   SOCIAL HISTORY:  reports that she has never smoked. She has never used smokeless tobacco. She reports that she does not drink alcohol or use illicit drugs.   ALLERGIES: Altace; Aspirin; and Metformin and related   MEDICATIONS:  Current Outpatient Prescriptions  Medication Sig Dispense Refill  . ACCU-CHEK AVIVA PLUS test strip USE 4/DAY TO CHECK BLOOD SUGARS.  150 each  2  . amLODipine (NORVASC) 10 MG tablet Take 1 tablet (10 mg total) by mouth daily. TAKE 1 TABLET (10 MG TOTAL) BY MOUTH DAILY for blood pressure.  90 tablet  1  . atorvastatin (LIPITOR) 40 MG tablet TAKE 1 TABLET (40 MG TOTAL) BY MOUTH DAILY.  90 tablet  1  . B-D UF III  MINI PEN NEEDLES 31G X 5 MM MISC USE 4 TIMES DAILY AS DIRECTED  200 each  5  . doxycycline (VIBRAMYCIN) 100 MG capsule Take 100 mg by mouth daily.      . furosemide (LASIX) 20 MG tablet TAKE 1/2 TABLET (10 MG TOTAL) BY MOUTH DAILY for blood pressure  30 tablet  6  . levothyroxine (LEVOTHROID) 25 MCG tablet Take 1 tablet (25 mcg total) by mouth daily before breakfast.  90 tablet  1  . losartan (COZAAR) 25 MG tablet Take 25 mg by mouth daily.      . metoprolol succinate (TOPROL-XL) 100 MG 24 hr tablet TAKE 1 TABLET BY MOUTH DAILY. TAKE WITH OR IMMEDIATELY FOLLOWING A MEAL.   90 tablet  1  . NOVOLOG MIX 70/30 FLEXPEN (70-30) 100 UNIT/ML Pen INJECT 67 UNITS EVERY MORNING AND 42 UNITS EVERY EVENING.  30 mL  1  . omeprazole (PRILOSEC) 40 MG capsule TAKE 1 CAPSULE (40 MG TOTAL) BY MOUTH DAILY AS NEEDED.  90 capsule  1  . potassium chloride SA (KLOR-CON M20) 20 MEQ tablet TAKE 1 TABLET (20 MEQ TOTAL) BY MOUTH DAILY.  30 tablet  6   No current facility-administered medications for this encounter.     REVIEW OF SYSTEMS:  Pertinent items are noted in HPI.    PHYSICAL EXAM: Alert and oriented 73 year old white female appearing her stated age. Wt Readings from Last 3 Encounters:  04/05/14 193 lb 11.2 oz (87.862 kg)  02/24/14 188 lb 12 oz (85.616 kg)  12/02/13 197 lb (89.359 kg)   Temp Readings from Last 3 Encounters:  04/05/14 97.9 F (36.6 C) Oral  02/24/14 98.1 F (36.7 C) Oral  12/02/13 97.7 F (36.5 C) Oral   BP Readings from Last 3 Encounters:  04/05/14 163/70  02/24/14 136/72  12/02/13 122/60   Pulse Readings from Last 3 Encounters:  04/05/14 63  02/24/14 60  12/02/13 64   Head and neck examination: Grossly unremarkable. Nodes: Without palpable cervical, supraclavicular, or axillary lymphadenopathy.: There is a palpable 2-3 cm mass along the upper-outer quadrant of the left breast at 2:00 with a punctate biopsy wound. Right breast without masses or lesions. Abdomen without hepatomegaly. Extremities: Without edema.    LABORATORY DATA:  Lab Results  Component Value Date   WBC 7.5 04/05/2014   HGB 13.0 04/05/2014   HCT 39.0 04/05/2014   MCV 86.7 04/05/2014   PLT 176 04/05/2014   Lab Results  Component Value Date   NA 139 04/05/2014   K 4.3 04/05/2014   CL 103 02/24/2014   CO2 24 04/05/2014   Lab Results  Component Value Date   ALT 16 04/05/2014   AST 14 04/05/2014   ALKPHOS 124 04/05/2014   BILITOT 0.58 04/05/2014      IMPRESSION: Stage 0 (Tis N0 M0) low-grade DCIS of the left breast. She'll undergo a stereotactic biopsy of her right breast  calcifications tomorrow. I explained to the patient and her family that local treatment options include mastectomy versus partial mastectomy with consideration of postoperative radiation therapy depending on her surgical findings. She will undergo a sentinel lymph node biopsy based on the extent of disease seen on MRI in that she may harbor occult invasive disease within the left breast. We discussed the potential acute and late toxicities of radiation therapy. I can weigh in on management options following her definitive surgery and review of her pathology. Lastly, we will await the results of her right breast biopsy scheduled for tomorrow.  PLAN: As above  I spent 30  minutes face to face with the patient and more than 50% of that time was spent in counseling and/or coordination of care.

## 2014-04-05 NOTE — Assessment & Plan Note (Signed)
Left breast DCIS: By MRI it was 5.3 cm low-grade ER positive PR negative by ultrasound was 2.4 cm.  Pathology discussion: I discussed with her the details of pathology including the difference between DCIS and invasive breast cancer and the significance of estrogen receptor positivity. Patient is planning to undergo lumpectomy or mastectomy on the breast and I will see her back after surgery to discuss antiestrogen therapy options.  We'll await final pathology before deciding on definitive recommendations. There is no evidence of invasive cancer, and her lymph nodes in the axilla are negative, she may not need any systemic chemotherapy and could be treated with antiestrogen therapy with anastrozole.  Right breast calcifications: Of mammogram done yesterday showed suspicious calcifications and she is planning to get the biopsy on the right side. It is strange because MRI did not show any evidence of abnormality in the right breast.

## 2014-04-05 NOTE — Progress Notes (Signed)
Checked in new patient with no financial issues prior to seeing the dr. She has primary and secondary ins. I have her [facility] packet and appt card. She has not been out of the country,

## 2014-04-05 NOTE — Progress Notes (Signed)
Note created during office visit by Dr. Gudena. Copy to patient, original to scan. 

## 2014-04-05 NOTE — Progress Notes (Signed)
[facility] NOTE  Patient Care Team: Ria Bush, MD as PCP - General (Family Medicine) Fanny Skates, MD as Consulting Physician (General Surgery) Rulon Eisenmenger, MD as Consulting Physician (Hematology and Oncology) Rexene Edison, MD as Consulting Physician (Radiation Oncology)  CHIEF COMPLAINTS/PURPOSE OF CONSULTATION:  Newly diagnosed left breast DCIS  HISTORY OF PRESENTING ILLNESS:  Julia Banks 73 y.o. female is here because of recent diagnosis of left breast DCIS. She has had frequent mammograms and a mammogram done in June 2015 only on the right breast showed benign calcifications and the recommendation was to follow up with another mammogram in 4 months. In the interim she felt nodule in the left breast that led to further investigations with ultrasound that revealed a 2.4 cm mass at 2:00 position this led to an MRI that revealed a 5.3 x 2.9 x 2.3 mass in the left breast biopsy of which showed low-grade DCIS that was ER positive PR negative. In the interim she also had a mammogram on the right breast which continues to sew suspicious abnormalities and she is scheduled to undergo a biopsy of the right breast as well. It is strange that the MRI did not show any abnormalities in the right breast. She was discussed as morning in the breast multidisciplinary tumor board and she is being seen today in the [facility] breast clinic. She reports being sore from the biopsy but otherwise without any new complaints.  I reviewed her records extensively and collaborated the history with the patient.  SUMMARY OF ONCOLOGIC HISTORY:   Breast cancer of upper-outer quadrant of left female breast   12/02/2013 Mammogram Benign right breast calcifications unchanged from before 4 month followup recommended   03/24/2014 Initial Diagnosis Breast cancer of upper-outer quadrant of left female breast: DCIS with papillary features, ER 93% PR 0%   04/04/2014 Breast MRI Left breast upper outer  quadrant posterior third irregular enhancing mass 5.3 x 2.0 x 2.3 cm: No abnormalities in the right breast or lymph nodes    In terms of breast cancer risk profile:  She menarched at early age of 35  She had low pregnancies  She has received birth control pills for approximately 3-4 years.  She was never exposed to fertility medications or hormone replacement therapy.  She has  family history of Breast/GYN/GI cancer Patient's great aunt on mother side of breast cancer and another great aunt the father's side colon cancer  MEDICAL HISTORY:  Past Medical History  Diagnosis Date  . Asthma   . Arthritis   . Headaches, cluster   . Type 2 diabetes, uncontrolled, with neuropathy   . Colon polyps     benign  . Hyperlipidemia   . Hypertension   . GERD (gastroesophageal reflux disease)   . Sleep apnea     on CPAP  . Coronary artery disease     30% stenosis reported on previous cardiac catheterization in 2006. This was done in [employer] [facility] [employer] [facility]  . Chronic kidney disease, stage 3   . Secondary hyperparathyroidism of renal origin     likely  . Breast cancer, left breast 02/2014    DCIS, ER+ PR-  . Thyroid disease     SURGICAL HISTORY: Past Surgical History  Procedure Laterality Date  . Gallbladder surgery    . Breast biopsy Right     benign  . Appendectomy    . Abdominal hysterectomy      heavy bleeding, ovaries remain  . Cardiovascular stress test  09/2012    WNL ([facility])  . Cardiac catheterization  2006    [place], [place].   . Cardiac catheterization  10/2013    mild 2v nonobstructive disease, EF 60%  . Breast biopsy Left 02/2014    ?DCIS  . Cholecystectomy      SOCIAL HISTORY: History   Social History  . Marital Status: Married    Spouse Name: N/A    Number of Children: N/A  . Years of Education: N/A   Occupational History  . Not on file.   Social History Main Topics  . Smoking status: Never Smoker   . Smokeless tobacco: Never Used     Comment: second hand  exposure  . Alcohol Use: No  . Drug Use: No  . Sexual Activity: Not on file   Other Topics Concern  . Not on file   Social History Narrative   Widow, husband Fritz Pickerel passed 19.  Has 3 dogs   No children   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, nutritional   Edu: 1 yr college      G0P0    FAMILY HISTORY: Family History  Problem Relation Age of Onset  . Arthritis Mother   . Hyperlipidemia Mother   . Stroke Mother   . Hypertension Mother   . Diabetes Mother   . Arthritis Father   . Hyperlipidemia Father   . CAD Father     MI  . Hypertension Father   . Cancer Neg Hx   . Breast cancer Other   . Colon cancer Other     ALLERGIES:  is allergic to altace; aspirin; and metformin and related.  MEDICATIONS:  Current Outpatient Prescriptions  Medication Sig Dispense Refill  . ACCU-CHEK AVIVA PLUS test strip USE 4/DAY TO CHECK BLOOD SUGARS.  150 each  2  . amLODipine (NORVASC) 10 MG tablet Take 1 tablet (10 mg total) by mouth daily. TAKE 1 TABLET (10 MG TOTAL) BY MOUTH DAILY for blood pressure.  90 tablet  1  . atorvastatin (LIPITOR) 40 MG tablet TAKE 1 TABLET (40 MG TOTAL) BY MOUTH DAILY.  90 tablet  1  . B-D UF III MINI PEN NEEDLES 31G X 5 MM MISC USE 4 TIMES DAILY AS DIRECTED  200 each  5  . doxycycline (VIBRAMYCIN) 100 MG capsule Take 100 mg by mouth daily.      . furosemide (LASIX) 20 MG tablet TAKE 1/2 TABLET (10 MG TOTAL) BY MOUTH DAILY for blood pressure  30 tablet  6  . levothyroxine (LEVOTHROID) 25 MCG tablet Take 1 tablet (25 mcg total) by mouth daily before breakfast.  90 tablet  1  . losartan (COZAAR) 25 MG tablet Take 25 mg by mouth daily.      . metoprolol succinate (TOPROL-XL) 100 MG 24 hr tablet TAKE 1 TABLET BY MOUTH DAILY. TAKE WITH OR IMMEDIATELY FOLLOWING A MEAL.  90 tablet  1  . NOVOLOG MIX 70/30 FLEXPEN (70-30) 100 UNIT/ML Pen INJECT 67 UNITS EVERY MORNING AND 42 UNITS EVERY EVENING.  30 mL  1  . omeprazole (PRILOSEC) 40 MG  capsule TAKE 1 CAPSULE (40 MG TOTAL) BY MOUTH DAILY AS NEEDED.  90 capsule  1  . potassium chloride SA (KLOR-CON M20) 20 MEQ tablet TAKE 1 TABLET (20 MEQ TOTAL) BY MOUTH DAILY.  30 tablet  6   No current facility-administered medications for this visit.    REVIEW OF SYSTEMS:   Constitutional: Denies fevers, chills or abnormal night sweats Eyes: Denies  blurriness of vision, double vision or watery eyes Ears, nose, mouth, throat, and face: Denies mucositis or sore throat Respiratory: Denies cough, dyspnea or wheezes Cardiovascular: Denies palpitation, chest discomfort or lower extremity swelling Gastrointestinal:  Denies nausea, heartburn or change in bowel habits Skin: Denies abnormal skin rashes Lymphatics: Denies new lymphadenopathy or easy bruising Neurological:Denies numbness, tingling or new weaknesses Behavioral/Psych: Mood is stable, no new changes  Breast: Palpable mass in the left breast All other systems were reviewed with the patient and are negative.  PHYSICAL EXAMINATION: ECOG PERFORMANCE STATUS: 0 - Asymptomatic  Filed Vitals:   04/05/14 0834  BP: 163/70  Pulse: 63  Temp: 97.9 F (36.6 C)  Resp: 19   Filed Weights   04/05/14 0834  Weight: 193 lb 11.2 oz (87.862 kg)    GENERAL:alert, no distress and comfortable SKIN: skin color, texture, turgor are normal, no rashes or significant lesions EYES: normal, conjunctiva are pink and non-injected, sclera clear OROPHARYNX:no exudate, no erythema and lips, buccal mucosa, and tongue normal  NECK: supple, thyroid normal size, non-tender, without nodularity LYMPH:  no palpable lymphadenopathy in the cervical, axillary or inguinal LUNGS: clear to auscultation and percussion with normal breathing effort HEART: regular rate & rhythm and no murmurs and no lower extremity edema ABDOMEN:abdomen soft, non-tender and normal bowel sounds Musculoskeletal:no cyanosis of digits and no clubbing  PSYCH: alert & oriented x 3 with  fluent speech NEURO: no focal motor/sensory deficits BREAST: 1 in size palpable mass is felt in the left breast. No palpable axillary or supraclavicular lymphadenopathy  LABORATORY DATA:  I have reviewed the data as listed Lab Results  Component Value Date   WBC 7.5 04/05/2014   HGB 13.0 04/05/2014   HCT 39.0 04/05/2014   MCV 86.7 04/05/2014   PLT 176 04/05/2014   Lab Results  Component Value Date   NA 139 04/05/2014   K 4.3 04/05/2014   CL 103 02/24/2014   CO2 24 04/05/2014    RADIOGRAPHIC STUDIES: I have personally reviewed the radiological reports and agreed with the findings in the report.  ASSESSMENT AND PLAN:  Breast cancer of upper-outer quadrant of left female breast Left breast DCIS: By MRI it was 5.3 cm low-grade ER positive PR negative by ultrasound was 2.4 cm.  Pathology discussion: I discussed with her the details of pathology including the difference between DCIS and invasive breast cancer and the significance of estrogen receptor positivity. Patient is planning to undergo lumpectomy or mastectomy on the breast and I will see her back after surgery to discuss antiestrogen therapy options.  We'll await final pathology before deciding on definitive recommendations. There is no evidence of invasive cancer, and her lymph nodes in the axilla are negative, she may not need any systemic chemotherapy and could be treated with antiestrogen therapy with anastrozole.  Right breast calcifications: mammogram done yesterday showed suspicious calcifications and she is planning to get the biopsy on the right side. It is strange because MRI did not show any evidence of abnormality in the right breast.  Multiple comorbidities: Patient has a history of multiple comorbidities including hypertension, diabetes, coronary artery disease, stroke and chronic kidney disease.  All questions were answered. The patient knows to call the clinic with any problems, questions or concerns. I spent 40 minutes  counseling the patient face to face. The total time spent in the appointment was 60 minutes and more than 50% was on counseling.     Rulon Eisenmenger, MD 04/05/2014 12:08 PM

## 2014-04-06 ENCOUNTER — Ambulatory Visit
Admission: RE | Admit: 2014-04-06 | Discharge: 2014-04-06 | Disposition: A | Discharge status: Home / Self Care | Payer: Medicare Other | Source: Ambulatory Visit | Attending: Obstetrics and Gynecology | Admitting: Obstetrics and Gynecology

## 2014-04-06 DIAGNOSIS — R921 Mammographic calcification found on diagnostic imaging of breast: Secondary | ICD-10-CM

## 2014-04-06 DIAGNOSIS — N63 Unspecified lump in breast: Secondary | ICD-10-CM | POA: Diagnosis not present

## 2014-04-08 ENCOUNTER — Telehealth: Payer: Self-pay

## 2014-04-08 NOTE — Telephone Encounter (Signed)
Office note rcvd from [facility] dtd 04/05/2014.  Reviewed by Dr Lindi Adie.  Sent to scan.

## 2014-04-10 ENCOUNTER — Telehealth: Payer: Self-pay | Admitting: *Deleted

## 2014-04-10 NOTE — Telephone Encounter (Signed)
Left message for a return phone call from [facility] 04/05/14.  Awaiting patient response.

## 2014-04-17 ENCOUNTER — Encounter (INDEPENDENT_AMBULATORY_CARE_PROVIDER_SITE_OTHER): Payer: Self-pay

## 2014-04-17 ENCOUNTER — Other Ambulatory Visit (INDEPENDENT_AMBULATORY_CARE_PROVIDER_SITE_OTHER): Payer: Self-pay | Admitting: General Surgery

## 2014-04-17 DIAGNOSIS — E785 Hyperlipidemia, unspecified: Secondary | ICD-10-CM | POA: Diagnosis not present

## 2014-04-17 DIAGNOSIS — C50412 Malignant neoplasm of upper-outer quadrant of left female breast: Secondary | ICD-10-CM | POA: Diagnosis not present

## 2014-04-17 DIAGNOSIS — N183 Chronic kidney disease, stage 3 (moderate): Secondary | ICD-10-CM | POA: Diagnosis not present

## 2014-04-17 DIAGNOSIS — G4733 Obstructive sleep apnea (adult) (pediatric): Secondary | ICD-10-CM | POA: Diagnosis not present

## 2014-04-17 DIAGNOSIS — J45998 Other asthma: Secondary | ICD-10-CM | POA: Diagnosis not present

## 2014-04-17 DIAGNOSIS — I10 Essential (primary) hypertension: Secondary | ICD-10-CM | POA: Diagnosis not present

## 2014-04-17 DIAGNOSIS — C50912 Malignant neoplasm of unspecified site of left female breast: Secondary | ICD-10-CM

## 2014-04-17 DIAGNOSIS — E1122 Type 2 diabetes mellitus with diabetic chronic kidney disease: Secondary | ICD-10-CM | POA: Diagnosis not present

## 2014-04-18 ENCOUNTER — Encounter: Payer: Self-pay | Admitting: Family Medicine

## 2014-04-18 ENCOUNTER — Encounter: Payer: Self-pay | Admitting: *Deleted

## 2014-04-18 ENCOUNTER — Other Ambulatory Visit: Payer: Self-pay | Admitting: *Deleted

## 2014-04-18 ENCOUNTER — Telehealth: Payer: Self-pay | Admitting: *Deleted

## 2014-04-18 NOTE — Telephone Encounter (Signed)
Cardiac clearance letter faxed  See letter

## 2014-04-21 ENCOUNTER — Telehealth: Payer: Self-pay | Admitting: *Deleted

## 2014-04-21 NOTE — Telephone Encounter (Signed)
Spoke with patient and confirmed appointment with Dr. Lindi Adie for 05/24/14 at 1000am.

## 2014-04-24 ENCOUNTER — Other Ambulatory Visit: Payer: Self-pay | Admitting: Endocrinology

## 2014-04-25 DIAGNOSIS — C50412 Malignant neoplasm of upper-outer quadrant of left female breast: Secondary | ICD-10-CM | POA: Diagnosis not present

## 2014-04-29 ENCOUNTER — Encounter: Payer: Self-pay | Admitting: Family Medicine

## 2014-05-01 ENCOUNTER — Telehealth (INDEPENDENT_AMBULATORY_CARE_PROVIDER_SITE_OTHER): Payer: Self-pay

## 2014-05-01 DIAGNOSIS — C50912 Malignant neoplasm of unspecified site of left female breast: Secondary | ICD-10-CM

## 2014-05-01 NOTE — Telephone Encounter (Signed)
Called pt to notify her that I did place this referral for genetics and that she should be getting a call from the [facility] this week about an appt. I also sent Dawn and Varney Biles a message thru epic about needing an appt before the pt's sx on 12/9 per Dr [name].

## 2014-05-02 ENCOUNTER — Ambulatory Visit (HOSPITAL_BASED_OUTPATIENT_CLINIC_OR_DEPARTMENT_OTHER): Payer: Medicare Other | Admitting: Genetic Counselor

## 2014-05-02 ENCOUNTER — Other Ambulatory Visit: Payer: PRIVATE HEALTH INSURANCE

## 2014-05-02 ENCOUNTER — Encounter: Payer: Self-pay | Admitting: Genetic Counselor

## 2014-05-02 DIAGNOSIS — C50412 Malignant neoplasm of upper-outer quadrant of left female breast: Secondary | ICD-10-CM

## 2014-05-02 DIAGNOSIS — Z315 Encounter for genetic counseling: Secondary | ICD-10-CM | POA: Diagnosis not present

## 2014-05-02 DIAGNOSIS — D0512 Intraductal carcinoma in situ of left breast: Secondary | ICD-10-CM

## 2014-05-02 DIAGNOSIS — Z803 Family history of malignant neoplasm of breast: Secondary | ICD-10-CM

## 2014-05-02 DIAGNOSIS — Z8041 Family history of malignant neoplasm of ovary: Secondary | ICD-10-CM

## 2014-05-02 DIAGNOSIS — Z806 Family history of leukemia: Secondary | ICD-10-CM | POA: Diagnosis not present

## 2014-05-02 DIAGNOSIS — Z800 Family history of malignant neoplasm of digestive organs: Secondary | ICD-10-CM

## 2014-05-02 DIAGNOSIS — Z808 Family history of malignant neoplasm of other organs or systems: Secondary | ICD-10-CM

## 2014-05-02 DIAGNOSIS — C50912 Malignant neoplasm of unspecified site of left female breast: Secondary | ICD-10-CM | POA: Diagnosis not present

## 2014-05-02 NOTE — Progress Notes (Signed)
Dr.  Rolm Bookbinder requested a consultation for genetic counseling and risk assessment for Julia Banks, a 73 y.o. female, for discussion of her personal history of breast cancer and family history of breast, ovarian and colon cancer as well as melanoma and leukemia.  She presents to clinic today with her niece Julia Banks, to discuss the possibility of a genetic predisposition to cancer, and to further clarify her risks, as well as her family members' risks for cancer.   HISTORY OF PRESENT ILLNESS: In 2015, at the age of 27, Julia Banks was diagnosed with DCIS of the left breast.  The tumor is ER+/PR-.  She has had a colonoscopy in the past and reports a total of 3-4 polyps.    Past Medical History  Diagnosis Date  . Asthma   . Arthritis   . Headaches, cluster   . Type 2 diabetes, uncontrolled, with neuropathy   . Colon polyps     benign  . Hyperlipidemia   . Hypertension   . GERD (gastroesophageal reflux disease)   . Sleep apnea     on CPAP  . Coronary artery disease     30% stenosis reported on previous cardiac catheterization in 2006. This was done in [facility]  . Chronic kidney disease, stage 3   . Secondary hyperparathyroidism of renal origin     likely  . Breast cancer, left breast 02/2014    DCIS, ER+ PR- initially rec bilat mastectomy with sentinal LN biopsy L axilla but now planning just L mastectomy Julia Banks)  . Thyroid disease     Past Surgical History  Procedure Laterality Date  . Gallbladder surgery    . Breast biopsy Right     benign  . Appendectomy    . Abdominal hysterectomy      heavy bleeding, ovaries remain  . Cardiovascular stress test  09/2012    WNL ([place])  . Cardiac catheterization  2006    [place], [place].   . Cardiac catheterization  10/2013    mild 2v nonobstructive disease, EF 60%  . Breast biopsy Left 02/2014    ?DCIS  . Cholecystectomy      History   Social History  . Marital Status: Married    Spouse Name:  N/A    Number of Children: 0  . Years of Education: N/A   Social History Main Topics  . Smoking status: Never Smoker   . Smokeless tobacco: Never Used     Comment: second hand exposure  . Alcohol Use: No  . Drug Use: No  . Sexual Activity: None   Other Topics Concern  . None   Social History Narrative   Widow, husband Julia Banks passed 2015.  Has 3 dogs   No children   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, nutritional   Edu: 1 yr college      G0P0    REPRODUCTIVE HISTORY AND PERSONAL RISK ASSESSMENT FACTORS: Menarche was at age 60.   postmenopausal Uterus Intact: no Ovaries Intact: yes G0P0A0, first live birth at age N/A  She has not previously undergone treatment for infertility.   Oral Contraceptive use: 3 years   She has not used HRT in the past.    FAMILY HISTORY:  We obtained a detailed, 4-generation family history.  Significant diagnoses are listed below: Family History  Problem Relation Age of Onset  . Arthritis Mother   . Hyperlipidemia Mother   . Stroke Mother   . Hypertension Mother   .  Diabetes Mother   . Arthritis Father   . Hyperlipidemia Father   . CAD Father     MI  . Hypertension Father   . Cancer Neg Hx   . Breast cancer Other   . Ovarian cancer Other   . Leukemia Brother     dx in his 37s  . Colon cancer Maternal Uncle     dx in his 74s  . Ovarian cancer Maternal Grandmother 90  . Melanoma Other 27    dx 2x - 78 and 29/dx 3x - ages 46, 11, and 36    Patient's maternal ancestors are of Caucasian descent, and paternal ancestors are of [place] descent. There is no reported Ashkenazi Jewish ancestry. There is no known consanguinity.  GENETIC COUNSELING ASSESSMENT: Julia Banks is a 73 y.o. female with a personal history of breast cancer and family history of leukemia, melanoma, breast, ovarian and colon cancer which somewhat suggestive of a hereditary cancer syndrome and predisposition to cancer. We, therefore,  discussed and recommended the following at today's visit.   DISCUSSION: We reviewed the characteristics, features and inheritance patterns of hereditary cancer syndromes. We also discussed genetic testing, including the appropriate family members to test, the process of testing, insurance coverage and turn-around-time for results. We reviewed specific hereditary conditions that were related to the cancer in her family including hereditary breast and ovarian cancer, Lynch syndrome and PTEN mutations.  We discussed targeted testing of these specific genes or a larger genetic panel that would look at additional genes associated with breast and ovarian cancer syndromes.  Julia Banks decided to proceed with a larger panel.  PLAN: After considering the risks, benefits, and limitations, Julia Banks provided informed consent to pursue genetic testing and the blood sample will be sent to [facility] for analysis of the OvaNext Panel and MSH2 inversion. We discussed the implications of a positive, negative and/ or variant of uncertain significance genetic test result. Results should be available within approximately 2-4 weeks' time, at which point they will be disclosed by telephone to Julia Banks, as will any additional recommendations warranted by these results. Julia Banks will receive a summary of her genetic counseling visit and a copy of her results once available. This information will also be available in Epic. We encouraged Julia Banks to remain in contact with cancer genetics annually so that we can continuously update the family history and inform her of any changes in cancer genetics and testing that may be of benefit for her family. Julia Banks's questions were answered to her satisfaction today. Our contact information was provided should additional questions or concerns arise.  The patient was seen for a total of 60 minutes, greater than 50% of which was  spent face-to-face counseling.  This note will also be sent to the referring provider via the electronic medical record. The patient will be supplied with a summary of this genetic counseling discussion as well as educational information on the discussed hereditary cancer syndromes following the conclusion of their visit.     _______________________________________________________________________ For Office Staff:  Number of people involved in session: 2 Was an Intern/ student involved with case: no

## 2014-05-03 ENCOUNTER — Encounter: Payer: Self-pay | Admitting: *Deleted

## 2014-05-03 ENCOUNTER — Telehealth: Payer: Self-pay | Admitting: Hematology and Oncology

## 2014-05-03 NOTE — Telephone Encounter (Signed)
lmonvm for pt re appt for 12/22. schedule mailed.

## 2014-05-04 ENCOUNTER — Other Ambulatory Visit (HOSPITAL_COMMUNITY): Payer: Medicare Other

## 2014-05-06 ENCOUNTER — Telehealth: Payer: Self-pay | Admitting: *Deleted

## 2014-05-06 NOTE — Telephone Encounter (Signed)
Faxed cardiac clearance to [facility] surgery

## 2014-05-08 ENCOUNTER — Other Ambulatory Visit (HOSPITAL_COMMUNITY): Payer: Medicare Other

## 2014-05-10 ENCOUNTER — Other Ambulatory Visit (HOSPITAL_COMMUNITY): Payer: Medicare Other

## 2014-05-10 ENCOUNTER — Ambulatory Visit: Admit: 2014-05-10 | Payer: Self-pay | Admitting: General Surgery

## 2014-05-10 SURGERY — SIMPLE MASTECTOMY WITH AXILLARY SENTINEL NODE BIOPSY
Anesthesia: General | Laterality: Right

## 2014-05-24 ENCOUNTER — Ambulatory Visit: Payer: Medicare Other | Admitting: Hematology and Oncology

## 2014-05-24 ENCOUNTER — Telehealth: Payer: Self-pay | Admitting: Hematology and Oncology

## 2014-05-24 NOTE — Telephone Encounter (Signed)
per VG to CX pt appt. Cld & spoke to pt abd adv to call and sch appt after her surgery. Pt understood

## 2014-05-26 ENCOUNTER — Other Ambulatory Visit: Payer: Self-pay | Admitting: Family Medicine

## 2014-05-30 NOTE — Pre-Procedure Instructions (Signed)
Christee Mervine Porcelli  05/30/2014   Your procedure is scheduled on:  Wednesday, December 9th  Report to [facility] Admitting at 0830 AM.  Call this number if you have problems the morning of surgery: [phone]   Remember:   Do not eat food or drink liquids after midnight.   Take these medicines the morning of surgery with A SIP OF WATER: norvasc, synthroid, toprol, prilosec   Do not wear jewelry, make-up or nail polish.  Do not wear lotions, powders, or perfumes, deodorant.  Do not shave 48 hours prior to surgery. Men may shave face and neck.  Do not bring valuables to the hospital.  [facility] is not responsible for any belongings or valuables.               Contacts, dentures or bridgework may not be worn into surgery.  Leave suitcase in the car. After surgery it may be brought to your room.  For patients admitted to the hospital, discharge time is determined by your  treatment team.            Please read over the following fact sheets that you were given: Pain Booklet, Coughing and Deep Breathing and Surgical Site Infection Prevention   - Preparing for Surgery  Before surgery, you can play an important role.  Because skin is not sterile, your skin needs to be as free of germs as possible.  You can reduce the number of germs on you skin by washing with CHG (chlorahexidine gluconate) soap before surgery.  CHG is an antiseptic cleaner which kills germs and bonds with the skin to continue killing germs even after washing.  Please DO NOT use if you have an allergy to CHG or antibacterial soaps.  If your skin becomes reddened/irritated stop using the CHG and inform your nurse when you arrive at Short Stay.  Do not shave (including legs and underarms) for at least 48 hours prior to the first CHG shower.  You may shave your face.  Please follow these instructions carefully:   1.  Shower with CHG Soap the night before surgery and the morning of Surgery.  2.  If you  choose to wash your hair, wash your hair first as usual with your normal shampoo.  3.  After you shampoo, rinse your hair and body thoroughly to remove the shampoo.  4.  Use CHG as you would any other liquid soap.  You can apply CHG directly to the skin and wash gently with scrungie or a clean washcloth.  5.  Apply the CHG Soap to your body ONLY FROM THE NECK DOWN.  Do not use on open wounds or open sores.  Avoid contact with your eyes, ears, mouth and genitals (private parts).  Wash genitals (private parts) with your normal soap.  6.  Wash thoroughly, paying special attention to the area where your surgery will be performed.  7.  Thoroughly rinse your body with warm water from the neck down.  8.  DO NOT shower/wash with your normal soap after using and rinsing off the CHG Soap.  9.  Pat yourself dry with a clean towel.            10.  Wear clean pajamas.            11.  Place clean sheets on your bed the night of your first shower and do not sleep with pets.  Day of Surgery  Do not apply any lotions/deoderants the  morning of surgery.  Please wear clean clothes to the hospital/surgery center.

## 2014-05-31 ENCOUNTER — Encounter (HOSPITAL_COMMUNITY): Payer: Self-pay

## 2014-05-31 ENCOUNTER — Encounter (HOSPITAL_COMMUNITY)
Admission: RE | Admit: 2014-05-31 | Discharge: 2014-05-31 | Disposition: A | Discharge status: Home / Self Care | Payer: Medicare Other | Source: Ambulatory Visit | Attending: General Surgery | Admitting: General Surgery

## 2014-05-31 ENCOUNTER — Encounter (HOSPITAL_COMMUNITY): Payer: Self-pay | Admitting: Pharmacy Technician

## 2014-05-31 ENCOUNTER — Ambulatory Visit (HOSPITAL_COMMUNITY)
Admission: RE | Admit: 2014-05-31 | Discharge: 2014-05-31 | Disposition: A | Discharge status: Home / Self Care | Payer: Medicare Other | Source: Ambulatory Visit | Attending: General Surgery | Admitting: General Surgery

## 2014-05-31 DIAGNOSIS — C50919 Malignant neoplasm of unspecified site of unspecified female breast: Secondary | ICD-10-CM | POA: Diagnosis not present

## 2014-05-31 DIAGNOSIS — Z01818 Encounter for other preprocedural examination: Secondary | ICD-10-CM

## 2014-05-31 HISTORY — DX: Pneumonia, unspecified organism: J18.9

## 2014-05-31 HISTORY — DX: Other complications of anesthesia, initial encounter: T88.59XA

## 2014-05-31 HISTORY — DX: Hypothyroidism, unspecified: E03.9

## 2014-05-31 HISTORY — DX: Adverse effect of unspecified anesthetic, initial encounter: T41.45XA

## 2014-05-31 LAB — COMPREHENSIVE METABOLIC PANEL
ALBUMIN: 4 g/dL (ref 3.5–5.2)
ALT: 14 U/L (ref 0–35)
AST: 16 U/L (ref 0–37)
Alkaline Phosphatase: 113 U/L (ref 39–117)
Anion gap: 14 (ref 5–15)
BUN: 16 mg/dL (ref 6–23)
CALCIUM: 9.7 mg/dL (ref 8.4–10.5)
CO2: 24 mEq/L (ref 19–32)
CREATININE: 1.16 mg/dL — AB (ref 0.50–1.10)
Chloride: 103 mEq/L (ref 96–112)
GFR calc Af Amer: 54 mL/min — ABNORMAL LOW (ref >90)
GFR calc non Af Amer: 46 mL/min — ABNORMAL LOW (ref >90)
Glucose, Bld: 108 mg/dL — ABNORMAL HIGH (ref 70–99)
Potassium: 4.2 mEq/L (ref 3.7–5.3)
SODIUM: 141 meq/L (ref 137–147)
Total Bilirubin: 0.6 mg/dL (ref 0.3–1.2)
Total Protein: 7.4 g/dL (ref 6.0–8.3)

## 2014-05-31 LAB — URINALYSIS, ROUTINE W REFLEX MICROSCOPIC
Bilirubin Urine: NEGATIVE
GLUCOSE, UA: NEGATIVE mg/dL
HGB URINE DIPSTICK: NEGATIVE
Ketones, ur: NEGATIVE mg/dL
LEUKOCYTES UA: NEGATIVE
Nitrite: NEGATIVE
Protein, ur: NEGATIVE mg/dL
Specific Gravity, Urine: 1.017 (ref 1.005–1.030)
Urobilinogen, UA: 0.2 mg/dL (ref 0.0–1.0)
pH: 6 (ref 5.0–8.0)

## 2014-05-31 LAB — CBC WITH DIFFERENTIAL/PLATELET
BASOS PCT: 0 % (ref 0–1)
Basophils Absolute: 0 10*3/uL (ref 0.0–0.1)
Eosinophils Absolute: 0.3 10*3/uL (ref 0.0–0.7)
Eosinophils Relative: 3 % (ref 0–5)
HEMATOCRIT: 39.5 % (ref 36.0–46.0)
Hemoglobin: 13.3 g/dL (ref 12.0–15.0)
Lymphocytes Relative: 24 % (ref 12–46)
Lymphs Abs: 2.4 10*3/uL (ref 0.7–4.0)
MCH: 28.6 pg (ref 26.0–34.0)
MCHC: 33.7 g/dL (ref 30.0–36.0)
MCV: 84.9 fL (ref 78.0–100.0)
MONO ABS: 0.6 10*3/uL (ref 0.1–1.0)
Monocytes Relative: 6 % (ref 3–12)
Neutro Abs: 6.3 10*3/uL (ref 1.7–7.7)
Neutrophils Relative %: 67 % (ref 43–77)
PLATELETS: 199 10*3/uL (ref 150–400)
RBC: 4.65 MIL/uL (ref 3.87–5.11)
RDW: 13.9 % (ref 11.5–15.5)
WBC: 9.6 10*3/uL (ref 4.0–10.5)

## 2014-06-01 NOTE — Progress Notes (Signed)
Anesthesia Chart Review:  Pt is 73 year old female scheduled for L total mastectomy with L axillary sentinel node biopsy on 06/07/2014 with Dr. [name].   PMH: HTN, CAD, OSA, DM, hyperlipidemia, asthma, chronic kidney disease, thyroid disease, breast cancer, secondary hyperparathyroidism. BMI 31  Medications include: amlodipine, lasix, losartan, metoprolol, insulin levothyroxine  Pt's cardiologist is Dr. Fletcher Anon.   Preoperative labs reviewed.  Cr 1.16 (values have ranged from 1.11-1.4 over last 6 months).   Chest x-ray reviewed.  1. Mild right middle lobe atelectasis and/or pleural thickening in the right minor fissure. Follow-up imaging can be obtained to demonstrate resolution and /or stability. 2. Exam is otherwise unremarkable.  CXR results routed to Dr. [name] for review.   EKG: NSR. Nonspecific T abnormality. Low voltage- possible pulmonary disease.   Cardiac cath 11/03/2013 at [facility] (report can be found under media tab in Epic): mild nonobstructive 2 vessel coronary artery disease (20% in the left circumflex and 30% in the RCA). Ejection fraction was normal.  Stress echo 06/01/2009:  -negative maximal stress echocardiogram for ischemia -poor fitness level (5.5 METS) -hypertensive BP response  Cardiac clearance can be found under letters tab on 04/18/2014 by Billee Cashing, RN. Pt is low risk for surgery from cardiac standpoint.   If no changes, I anticipate pt can proceed with surgery as scheduled.   Willeen Cass, FNP-BC [facility] Short Stay Surgical Center/Anesthesiology Phone: [phone] 06/01/2014 2:47 PM

## 2014-06-02 ENCOUNTER — Encounter: Payer: Self-pay | Admitting: Genetic Counselor

## 2014-06-02 ENCOUNTER — Telehealth: Payer: Self-pay | Admitting: Genetic Counselor

## 2014-06-02 DIAGNOSIS — Z1379 Encounter for other screening for genetic and chromosomal anomalies: Secondary | ICD-10-CM | POA: Insufficient documentation

## 2014-06-02 NOTE — Telephone Encounter (Signed)
Revealed negative results on genetic testing. Genes tested include: ATM, BARD1, BRCA1, BRCA2, BRIP1, CDH1, CHEK2, EPCAM, MLH1, MRE11A, MSH2 (plus exons 1-7 inversion), MSH6, MUTYH, NBN, NF1, PALB2, PMS2, PTEN, RAD50, RAD51C, RAD51D, SMARCA4, STK11, and TP53.

## 2014-06-02 NOTE — Progress Notes (Signed)
HPI: Ms. Busker was previously seen in the [place] clinic due to a personal and family history history of cancer and concerns regarding a hereditary predisposition to cancer. Please refer to our prior cancer genetics clinic note for more information regarding Ms. Brumley's medical, social and family histories, and our assessment and recommendations, at the time. Ms. Sammarco recent genetic test results were disclosed to her, as were recommendations warranted by these results. These results and recommendations are discussed in more detail below.  GENETIC TEST RESULTS: At the time of Ms. Vanterpool's visit, we recommended she pursue genetic testing of the OvaNext gene panel. This test, which included sequencing and deletion/duplication analysis of the following genes:  ATM, BARD1, BRCA1, BRCA2, BRIP1, CDH1, CHEK2, EPCAM, MLH1, MRE11A, MSH2, MSH6, MUTYH, NBN, NF1, PALB2, PMS2, PTEN, RAD50, RAD51C, RAD51D, SMARCA4, STK11, and TP53.  The report date is 06/01/14.  Testing was performed at [facility]. Genetic testing was normal, and did not reveal a deleterious mutation in these genes. The test report has been scanned into EPIC and is located under the Media tab.   We discussed with Ms. Trippett that since the current genetic testing is not perfect, it is possible there may be a gene mutation in one of these genes that current testing cannot detect, but that chance is small. We also discussed, that it is possible that another gene that has not yet been discovered, or that we have not yet tested, is responsible for the cancer diagnoses in the family, and it is, therefore, important to remain in touch with cancer genetics in the future so that we can continue to offer Ms. Gatson the most up to date genetic testing.   CANCER SCREENING RECOMMENDATIONS: This result is reassuring and suggests that Ms. Gee's cancer was most likely not due to an inherited  predisposition associated with one of these genes. Most cancers happen by chance and this negative test, along with details of her family history, suggests that her cancer falls into this category. We, therefore, recommended she continue to follow the cancer management and screening guidelines provided by her oncology and primary providers.   RECOMMENDATIONS FOR FAMILY MEMBERS: Women in this family might be at some increased risk of developing cancer, over the general population risk, simply due to the family history of cancer. We recommended women in this family have a yearly mammogram beginning at age 56, an an annual clinical breast exam, and perform monthly breast self-exams. Women in this family should also have a gynecological exam as recommended by their primary provider. All family members should have a colonoscopy by age 56.  FOLLOW-UP: Lastly, we discussed with Ms. Fluegel that cancer genetics is a rapidly advancing field and it is possible that new genetic tests will be appropriate for her and/or her family members in the future. We encouraged her to remain in contact with cancer genetics on an annual basis so we can update her personal and family histories and let her know of advances in cancer genetics that may benefit this family.   Our contact number was provided. Ms.. Inthavong's questions were answered to her satisfaction, and she knows she is welcome to call us at anytime with additional questions or concerns.   Roma Kayser, MS, [facility] Certified Genetic Counselor [email or web address]

## 2014-06-05 ENCOUNTER — Other Ambulatory Visit (INDEPENDENT_AMBULATORY_CARE_PROVIDER_SITE_OTHER): Payer: Self-pay | Admitting: General Surgery

## 2014-06-05 DIAGNOSIS — C50412 Malignant neoplasm of upper-outer quadrant of left female breast: Secondary | ICD-10-CM

## 2014-06-06 MED ORDER — CEFAZOLIN SODIUM-DEXTROSE 2-3 GM-% IV SOLR
2.0000 g | INTRAVENOUS | Status: AC
  Administered 2014-06-07: 2 g via INTRAVENOUS
  Filled 2014-06-06: qty 50

## 2014-06-07 ENCOUNTER — Encounter (HOSPITAL_COMMUNITY)
Admission: RE | Disposition: A | Discharge status: Home / Self Care | Payer: Self-pay | Source: Ambulatory Visit | Attending: General Surgery

## 2014-06-07 ENCOUNTER — Observation Stay (HOSPITAL_COMMUNITY)
Admission: RE | Admit: 2014-06-07 | Discharge: 2014-06-08 | Disposition: A | Discharge status: Home / Self Care | Payer: Medicare Other | Source: Ambulatory Visit | Attending: General Surgery | Admitting: General Surgery

## 2014-06-07 ENCOUNTER — Ambulatory Visit (HOSPITAL_COMMUNITY): Payer: Medicare Other | Admitting: Emergency Medicine

## 2014-06-07 ENCOUNTER — Ambulatory Visit (HOSPITAL_COMMUNITY)
Admission: RE | Admit: 2014-06-07 | Discharge: 2014-06-07 | Disposition: A | Discharge status: Home / Self Care | Payer: Medicare Other | Source: Ambulatory Visit | Attending: General Surgery | Admitting: General Surgery

## 2014-06-07 ENCOUNTER — Encounter (HOSPITAL_COMMUNITY): Payer: Self-pay | Admitting: *Deleted

## 2014-06-07 ENCOUNTER — Ambulatory Visit (HOSPITAL_COMMUNITY): Payer: Medicare Other | Admitting: Anesthesiology

## 2014-06-07 DIAGNOSIS — Z9010 Acquired absence of unspecified breast and nipple: Secondary | ICD-10-CM

## 2014-06-07 DIAGNOSIS — D0512 Intraductal carcinoma in situ of left breast: Principal | ICD-10-CM | POA: Insufficient documentation

## 2014-06-07 DIAGNOSIS — Z170 Estrogen receptor positive status [ER+]: Secondary | ICD-10-CM | POA: Diagnosis not present

## 2014-06-07 DIAGNOSIS — G8918 Other acute postprocedural pain: Secondary | ICD-10-CM | POA: Diagnosis not present

## 2014-06-07 DIAGNOSIS — E669 Obesity, unspecified: Secondary | ICD-10-CM | POA: Insufficient documentation

## 2014-06-07 DIAGNOSIS — I10 Essential (primary) hypertension: Secondary | ICD-10-CM | POA: Diagnosis not present

## 2014-06-07 DIAGNOSIS — Z6831 Body mass index (BMI) 31.0-31.9, adult: Secondary | ICD-10-CM | POA: Insufficient documentation

## 2014-06-07 DIAGNOSIS — G4730 Sleep apnea, unspecified: Secondary | ICD-10-CM | POA: Insufficient documentation

## 2014-06-07 DIAGNOSIS — K219 Gastro-esophageal reflux disease without esophagitis: Secondary | ICD-10-CM | POA: Diagnosis not present

## 2014-06-07 DIAGNOSIS — C50912 Malignant neoplasm of unspecified site of left female breast: Secondary | ICD-10-CM | POA: Diagnosis present

## 2014-06-07 DIAGNOSIS — E780 Pure hypercholesterolemia: Secondary | ICD-10-CM | POA: Diagnosis not present

## 2014-06-07 DIAGNOSIS — D0592 Unspecified type of carcinoma in situ of left breast: Secondary | ICD-10-CM | POA: Diagnosis not present

## 2014-06-07 DIAGNOSIS — E119 Type 2 diabetes mellitus without complications: Secondary | ICD-10-CM | POA: Insufficient documentation

## 2014-06-07 HISTORY — PX: SIMPLE MASTECTOMY WITH AXILLARY SENTINEL NODE BIOPSY: SHX6098

## 2014-06-07 HISTORY — PX: MASTECTOMY W/ SENTINEL NODE BIOPSY: SHX2001

## 2014-06-07 LAB — GLUCOSE, CAPILLARY
Glucose-Capillary: 202 mg/dL — ABNORMAL HIGH (ref 70–99)
Glucose-Capillary: 211 mg/dL — ABNORMAL HIGH (ref 70–99)

## 2014-06-07 SURGERY — SIMPLE MASTECTOMY WITH AXILLARY SENTINEL NODE BIOPSY
Anesthesia: Regional | Site: Breast | Laterality: Left

## 2014-06-07 MED ORDER — METOPROLOL SUCCINATE ER 100 MG PO TB24
100.0000 mg | ORAL_TABLET | Freq: Once | ORAL | Status: AC
  Administered 2014-06-07: 100 mg via ORAL
  Filled 2014-06-07: qty 1

## 2014-06-07 MED ORDER — AMLODIPINE BESYLATE 10 MG PO TABS
10.0000 mg | ORAL_TABLET | Freq: Every day | ORAL | Status: DC
  Administered 2014-06-07 – 2014-06-08 (×2): 10 mg via ORAL
  Filled 2014-06-07: qty 2
  Filled 2014-06-07 (×2): qty 1
  Filled 2014-06-07: qty 2

## 2014-06-07 MED ORDER — LACTATED RINGERS IV SOLN
INTRAVENOUS | Status: DC | PRN
  Administered 2014-06-07: 11:00:00 via INTRAVENOUS

## 2014-06-07 MED ORDER — ROCURONIUM BROMIDE 100 MG/10ML IV SOLN
INTRAVENOUS | Status: DC | PRN
  Administered 2014-06-07: 40 mg via INTRAVENOUS
  Administered 2014-06-07: 10 mg via INTRAVENOUS

## 2014-06-07 MED ORDER — LEVOTHYROXINE SODIUM 25 MCG PO TABS
25.0000 ug | ORAL_TABLET | Freq: Every day | ORAL | Status: DC
  Administered 2014-06-08: 25 ug via ORAL
  Filled 2014-06-07 (×2): qty 1

## 2014-06-07 MED ORDER — GLYCOPYRROLATE 0.2 MG/ML IJ SOLN
INTRAMUSCULAR | Status: AC
  Filled 2014-06-07: qty 2

## 2014-06-07 MED ORDER — ONDANSETRON HCL 4 MG/2ML IJ SOLN: 4.0000 mg | Freq: Four times a day (QID) | INTRAMUSCULAR | Status: DC | PRN

## 2014-06-07 MED ORDER — LOSARTAN POTASSIUM 25 MG PO TABS
25.0000 mg | ORAL_TABLET | Freq: Every day | ORAL | Status: DC
  Administered 2014-06-08: 25 mg via ORAL
  Filled 2014-06-07: qty 1

## 2014-06-07 MED ORDER — CEFAZOLIN SODIUM-DEXTROSE 2-3 GM-% IV SOLR
2.0000 g | Freq: Three times a day (TID) | INTRAVENOUS | Status: AC
  Administered 2014-06-07 – 2014-06-08 (×2): 2 g via INTRAVENOUS
  Filled 2014-06-07 (×2): qty 50

## 2014-06-07 MED ORDER — LIDOCAINE HCL (CARDIAC) 20 MG/ML IV SOLN
INTRAVENOUS | Status: DC | PRN
  Administered 2014-06-07: 100 mg via INTRAVENOUS

## 2014-06-07 MED ORDER — LIDOCAINE HCL (CARDIAC) 20 MG/ML IV SOLN
INTRAVENOUS | Status: AC
  Filled 2014-06-07: qty 5

## 2014-06-07 MED ORDER — METOPROLOL SUCCINATE ER 100 MG PO TB24
100.0000 mg | ORAL_TABLET | Freq: Every day | ORAL | Status: DC
  Administered 2014-06-08: 100 mg via ORAL
  Filled 2014-06-07: qty 1

## 2014-06-07 MED ORDER — ACETAMINOPHEN 650 MG RE SUPP: 650.0000 mg | Freq: Four times a day (QID) | RECTAL | Status: DC | PRN

## 2014-06-07 MED ORDER — ONDANSETRON HCL 4 MG/2ML IJ SOLN
INTRAMUSCULAR | Status: AC
  Filled 2014-06-07: qty 2

## 2014-06-07 MED ORDER — OXYCODONE HCL 5 MG PO TABS
5.0000 mg | ORAL_TABLET | ORAL | Status: DC | PRN
  Administered 2014-06-07 – 2014-06-08 (×5): 5 mg via ORAL
  Filled 2014-06-07 (×5): qty 1

## 2014-06-07 MED ORDER — FENTANYL CITRATE 0.05 MG/ML IJ SOLN
INTRAMUSCULAR | Status: AC
  Filled 2014-06-07: qty 2

## 2014-06-07 MED ORDER — PANTOPRAZOLE SODIUM 40 MG IV SOLR: 40.0000 mg | Freq: Every day | INTRAVENOUS | Status: DC

## 2014-06-07 MED ORDER — ARTIFICIAL TEARS OP OINT
TOPICAL_OINTMENT | OPHTHALMIC | Status: AC
  Filled 2014-06-07: qty 3.5

## 2014-06-07 MED ORDER — ATORVASTATIN CALCIUM 40 MG PO TABS
40.0000 mg | ORAL_TABLET | Freq: Every day | ORAL | Status: DC
  Administered 2014-06-07: 40 mg via ORAL
  Filled 2014-06-07 (×2): qty 1

## 2014-06-07 MED ORDER — SODIUM CHLORIDE 0.9 % IV SOLN
INTRAVENOUS | Status: DC
  Administered 2014-06-07 – 2014-06-08 (×2): via INTRAVENOUS

## 2014-06-07 MED ORDER — ROCURONIUM BROMIDE 50 MG/5ML IV SOLN
INTRAVENOUS | Status: AC
  Filled 2014-06-07: qty 1

## 2014-06-07 MED ORDER — METHYLENE BLUE 1 % INJ SOLN
INTRAMUSCULAR | Status: AC
  Filled 2014-06-07: qty 10

## 2014-06-07 MED ORDER — EPHEDRINE SULFATE 50 MG/ML IJ SOLN
INTRAMUSCULAR | Status: DC | PRN
  Administered 2014-06-07: 5 mg via INTRAVENOUS
  Administered 2014-06-07: 10 mg via INTRAVENOUS

## 2014-06-07 MED ORDER — LACTATED RINGERS IV SOLN
INTRAVENOUS | Status: DC
  Administered 2014-06-07: 10:00:00 via INTRAVENOUS

## 2014-06-07 MED ORDER — PROPOFOL 10 MG/ML IV BOLUS
INTRAVENOUS | Status: DC | PRN
  Administered 2014-06-07: 40 mg via INTRAVENOUS
  Administered 2014-06-07: 160 mg via INTRAVENOUS

## 2014-06-07 MED ORDER — FENTANYL CITRATE 0.05 MG/ML IJ SOLN
25.0000 ug | INTRAMUSCULAR | Status: DC | PRN
  Administered 2014-06-07 (×2): 50 ug via INTRAVENOUS

## 2014-06-07 MED ORDER — ARTIFICIAL TEARS OP OINT
TOPICAL_OINTMENT | OPHTHALMIC | Status: DC | PRN
  Administered 2014-06-07: 1 via OPHTHALMIC

## 2014-06-07 MED ORDER — NEOSTIGMINE METHYLSULFATE 10 MG/10ML IV SOLN
INTRAVENOUS | Status: DC | PRN
  Administered 2014-06-07: 3 mg via INTRAVENOUS

## 2014-06-07 MED ORDER — MENTHOL 3 MG MT LOZG
1.0000 | LOZENGE | OROMUCOSAL | Status: DC | PRN
  Administered 2014-06-07: 3 mg via ORAL
  Filled 2014-06-07: qty 9

## 2014-06-07 MED ORDER — FENTANYL CITRATE 0.05 MG/ML IJ SOLN
INTRAMUSCULAR | Status: AC
  Filled 2014-06-07: qty 5

## 2014-06-07 MED ORDER — PROMETHAZINE HCL 25 MG/ML IJ SOLN: 6.2500 mg | INTRAMUSCULAR | Status: DC | PRN

## 2014-06-07 MED ORDER — PANTOPRAZOLE SODIUM 40 MG PO TBEC
40.0000 mg | DELAYED_RELEASE_TABLET | Freq: Every day | ORAL | Status: DC
  Administered 2014-06-07 – 2014-06-08 (×2): 40 mg via ORAL
  Filled 2014-06-07 (×2): qty 1

## 2014-06-07 MED ORDER — MEPERIDINE HCL 25 MG/ML IJ SOLN: 6.2500 mg | INTRAMUSCULAR | Status: DC | PRN

## 2014-06-07 MED ORDER — MORPHINE SULFATE 2 MG/ML IJ SOLN
2.0000 mg | INTRAMUSCULAR | Status: DC | PRN
Start: 2014-06-07 — End: 2014-06-08
  Administered 2014-06-07 – 2014-06-08 (×6): 2 mg via INTRAVENOUS
  Filled 2014-06-07 (×6): qty 1

## 2014-06-07 MED ORDER — INSULIN ASPART 100 UNIT/ML ~~LOC~~ SOLN
0.0000 [IU] | Freq: Three times a day (TID) | SUBCUTANEOUS | Status: DC
  Administered 2014-06-07: 5 [IU] via SUBCUTANEOUS
  Administered 2014-06-08: 8 [IU] via SUBCUTANEOUS
  Administered 2014-06-08: 3 [IU] via SUBCUTANEOUS

## 2014-06-07 MED ORDER — ACETAMINOPHEN 325 MG PO TABS: 650.0000 mg | ORAL_TABLET | Freq: Four times a day (QID) | ORAL | Status: DC | PRN

## 2014-06-07 MED ORDER — FENTANYL CITRATE 0.05 MG/ML IJ SOLN
INTRAMUSCULAR | Status: DC | PRN
  Administered 2014-06-07 (×2): 50 ug via INTRAVENOUS

## 2014-06-07 MED ORDER — GLYCOPYRROLATE 0.2 MG/ML IJ SOLN
INTRAMUSCULAR | Status: DC | PRN
  Administered 2014-06-07: 0.6 mg via INTRAVENOUS

## 2014-06-07 MED ORDER — 0.9 % SODIUM CHLORIDE (POUR BTL) OPTIME
TOPICAL | Status: DC | PRN
  Administered 2014-06-07: 1000 mL

## 2014-06-07 MED ORDER — ONDANSETRON HCL 4 MG/2ML IJ SOLN
INTRAMUSCULAR | Status: DC | PRN
  Administered 2014-06-07: 4 mg via INTRAVENOUS

## 2014-06-07 MED ORDER — TECHNETIUM TC 99M SULFUR COLLOID FILTERED
1.0000 | Freq: Once | INTRAVENOUS | Status: AC | PRN
  Administered 2014-06-07: 1 via INTRADERMAL

## 2014-06-07 MED ORDER — NEOSTIGMINE METHYLSULFATE 10 MG/10ML IV SOLN
INTRAVENOUS | Status: AC
  Filled 2014-06-07: qty 1

## 2014-06-07 MED ORDER — FENTANYL CITRATE 0.05 MG/ML IJ SOLN
INTRAMUSCULAR | Status: AC
  Administered 2014-06-07: 100 ug
  Filled 2014-06-07: qty 2

## 2014-06-07 MED ORDER — IBUPROFEN 200 MG PO TABS: 200.0000 mg | ORAL_TABLET | Freq: Four times a day (QID) | ORAL | Status: DC | PRN

## 2014-06-07 MED ORDER — MIDAZOLAM HCL 2 MG/2ML IJ SOLN
INTRAMUSCULAR | Status: AC
  Administered 2014-06-07: 2 mg
  Filled 2014-06-07: qty 2

## 2014-06-07 SURGICAL SUPPLY — 51 items
APPLIER CLIP 9.375 MED OPEN (MISCELLANEOUS) ×2
BINDER BREAST LRG (GAUZE/BANDAGES/DRESSINGS) IMPLANT
BINDER BREAST XLRG (GAUZE/BANDAGES/DRESSINGS) ×2 IMPLANT
BIOPATCH RED 1 DISK 7.0 (GAUZE/BANDAGES/DRESSINGS) ×2 IMPLANT
CANISTER SUCTION 2500CC (MISCELLANEOUS) ×2 IMPLANT
CHLORAPREP W/TINT 26ML (MISCELLANEOUS) ×2 IMPLANT
CLIP APPLIE 9.375 MED OPEN (MISCELLANEOUS) ×1 IMPLANT
CONT SPEC 4OZ CLIKSEAL STRL BL (MISCELLANEOUS) ×2 IMPLANT
COVER PROBE W GEL 5X96 (DRAPES) ×2 IMPLANT
COVER SURGICAL LIGHT HANDLE (MISCELLANEOUS) ×2 IMPLANT
DERMABOND ADHESIVE PROPEN (GAUZE/BANDAGES/DRESSINGS) ×1
DERMABOND ADVANCED .7 DNX6 (GAUZE/BANDAGES/DRESSINGS) ×1 IMPLANT
DRAIN CHANNEL 19F RND (DRAIN) ×2 IMPLANT
DRAPE CHEST BREAST 15X10 FENES (DRAPES) ×2 IMPLANT
DRSG TEGADERM 2-3/8X2-3/4 SM (GAUZE/BANDAGES/DRESSINGS) ×2 IMPLANT
ELECT BLADE 4.0 EZ CLEAN MEGAD (MISCELLANEOUS) ×2
ELECT CAUTERY BLADE 6.4 (BLADE) ×2 IMPLANT
ELECT REM PT RETURN 9FT ADLT (ELECTROSURGICAL) ×2
ELECTRODE BLDE 4.0 EZ CLN MEGD (MISCELLANEOUS) ×1 IMPLANT
ELECTRODE REM PT RTRN 9FT ADLT (ELECTROSURGICAL) ×1 IMPLANT
EVACUATOR SILICONE 100CC (DRAIN) ×2 IMPLANT
GLOVE BIO SURGEON STRL SZ7 (GLOVE) ×2 IMPLANT
GLOVE BIOGEL PI IND STRL 7.0 (GLOVE) ×2 IMPLANT
GLOVE BIOGEL PI IND STRL 7.5 (GLOVE) ×1 IMPLANT
GLOVE BIOGEL PI INDICATOR 7.0 (GLOVE) ×2
GLOVE BIOGEL PI INDICATOR 7.5 (GLOVE) ×1
GLOVE SS BIOGEL STRL SZ 6.5 (GLOVE) ×1 IMPLANT
GLOVE SUPERSENSE BIOGEL SZ 6.5 (GLOVE) ×1
GLOVE SURG SS PI 7.0 STRL IVOR (GLOVE) ×2 IMPLANT
GOWN STRL REUS W/ TWL LRG LVL3 (GOWN DISPOSABLE) ×2 IMPLANT
GOWN STRL REUS W/TWL LRG LVL3 (GOWN DISPOSABLE) ×2
KIT BASIN OR (CUSTOM PROCEDURE TRAY) ×2 IMPLANT
KIT ROOM TURNOVER OR (KITS) ×2 IMPLANT
LIQUID BAND (GAUZE/BANDAGES/DRESSINGS) ×2 IMPLANT
NEEDLE 18GX1X1/2 (RX/OR ONLY) (NEEDLE) IMPLANT
NEEDLE HYPO 25GX1X1/2 BEV (NEEDLE) IMPLANT
NS IRRIG 1000ML POUR BTL (IV SOLUTION) ×2 IMPLANT
PACK GENERAL/GYN (CUSTOM PROCEDURE TRAY) ×2 IMPLANT
PAD ABD 8X10 STRL (GAUZE/BANDAGES/DRESSINGS) ×2 IMPLANT
PAD ARMBOARD 7.5X6 YLW CONV (MISCELLANEOUS) ×2 IMPLANT
PIN SAFETY STERILE (MISCELLANEOUS) ×2 IMPLANT
SPECIMEN JAR X LARGE (MISCELLANEOUS) ×2 IMPLANT
STAPLER VISISTAT 35W (STAPLE) ×2 IMPLANT
STRIP CLOSURE SKIN 1/2X4 (GAUZE/BANDAGES/DRESSINGS) ×2 IMPLANT
SUT ETHILON 2 0 FS 18 (SUTURE) ×2 IMPLANT
SUT MNCRL AB 4-0 PS2 18 (SUTURE) ×2 IMPLANT
SUT SILK 2 0 FS (SUTURE) ×2 IMPLANT
SUT VIC AB 3-0 SH 8-18 (SUTURE) ×2 IMPLANT
SYR CONTROL 10ML LL (SYRINGE) IMPLANT
TOWEL OR 17X24 6PK STRL BLUE (TOWEL DISPOSABLE) ×2 IMPLANT
TOWEL OR 17X26 10 PK STRL BLUE (TOWEL DISPOSABLE) ×2 IMPLANT

## 2014-06-07 NOTE — Transfer of Care (Signed)
Immediate Anesthesia Transfer of Care Note  Patient: Julia Banks  Procedure(s) Performed: Procedure(s): LEFT TOTAL MASTECTOMY WITH LEFT AXILLARY SENTINEL NODE BIOPSY (Left)  Patient Location: PACU  Anesthesia Type:General and Regional  Level of Consciousness: awake, alert , oriented and sedated  Airway & Oxygen Therapy: Patient Spontanous Breathing and Patient connected to nasal cannula oxygen  Post-op Assessment: Report given to PACU RN, Post -op Vital signs reviewed and stable and Patient moving all extremities  Post vital signs: Reviewed and stable  Complications: No apparent anesthesia complications

## 2014-06-07 NOTE — Anesthesia Preprocedure Evaluation (Addendum)
Anesthesia Evaluation  Patient identified by MRN, date of birth, ID band Patient awake    Reviewed: Allergy & Precautions, H&P , NPO status , Patient's Chart, lab work & pertinent test results, reviewed documented beta blocker date and time   Airway Mallampati: II   Neck ROM: Full    Dental  (+) Teeth Intact   Pulmonary sleep apnea and Continuous Positive Airway Pressure Ventilation ,  breath sounds clear to auscultation        Cardiovascular hypertension, Pt. on medications  Cath resent with mild non flow limiting disease, EF 65%   Neuro/Psych    GI/Hepatic GERD-  Medicated,  Endo/Other  diabetes, Poorly Controlled, Type 2  Renal/GU      Musculoskeletal   Abdominal (+) + obese,   Peds  Hematology   Anesthesia Other Findings   Reproductive/Obstetrics                            Anesthesia Physical Anesthesia Plan  ASA: III  Anesthesia Plan: General   Post-op Pain Management: MAC Combined w/ Regional for Post-op pain   Induction: Intravenous  Airway Management Planned: Oral ETT  Additional Equipment:   Intra-op Plan:   Post-operative Plan: Extubation in OR  Informed Consent: I have reviewed the patients History and Physical, chart, labs and discussed the procedure including the risks, benefits and alternatives for the proposed anesthesia with the patient or authorized representative who has indicated his/her understanding and acceptance.     Plan Discussed with:   Anesthesia Plan Comments: (Will offer PECS 2 block)        Anesthesia Quick Evaluation

## 2014-06-07 NOTE — Anesthesia Procedure Notes (Addendum)
Anesthesia Regional Block:  Pectoralis block  Pre-Anesthetic Checklist: ,, timeout performed, Correct Patient, Correct Site, Correct Laterality, Correct Procedure, Correct Position, site marked, Risks and benefits discussed,  Surgical consent,  Pre-op evaluation,  At surgeon's request and post-op pain management  Laterality: Left  Prep: Maximum Sterile Barrier Precautions used       Needles:   Needle Type: Echogenic Stimulator Needle     Needle Length: 10cm 10 cm Needle Gauge: 22 and 22 G    Additional Needles:  Procedures: ultrasound guided (picture in chart) Pectoralis block Narrative:  Injection made incrementally with aspirations every 5 mL. Anesthesiologist: Alexis Frock  Additional Notes: L PEC 2 block, US guide, 55ml .5% marcaine with epi, multiple asp, talked to patient throughout   Procedure Name: Intubation Date/Time: 06/07/2014 11:24 AM Performed by: Ebbie Latus E Pre-anesthesia Checklist: Patient identified, Emergency Drugs available, Suction available, Patient being monitored and Timeout performed Patient Re-evaluated:Patient Re-evaluated prior to inductionOxygen Delivery Method: Circle system utilized Preoxygenation: Pre-oxygenation with 100% oxygen Intubation Type: IV induction Ventilation: Mask ventilation without difficulty Laryngoscope Size: Miller and 2 Grade View: Grade I Tube type: Oral Tube size: 7.5 mm Number of attempts: 1 Airway Equipment and Method: Stylet Placement Confirmation: ETT inserted through vocal cords under direct vision,  positive ETCO2 and breath sounds checked- equal and bilateral Secured at: 22 cm Tube secured with: Tape Dental Injury: Teeth and Oropharynx as per pre-operative assessment

## 2014-06-07 NOTE — Anesthesia Postprocedure Evaluation (Signed)
  Anesthesia Post-op Note  Patient: Julia Banks  Procedure(s) Performed: Procedure(s): LEFT TOTAL MASTECTOMY WITH LEFT AXILLARY SENTINEL NODE BIOPSY (Left)  Patient Location: PACU  Anesthesia Type:General  Level of Consciousness: awake and alert   Airway and Oxygen Therapy: Patient Spontanous Breathing and Patient connected to nasal cannula oxygen  Post-op Pain: mild  Post-op Assessment: Post-op Vital signs reviewed, Patient's Cardiovascular Status Stable and Patent Airway  Post-op Vital Signs: Reviewed and stable  Last Vitals:  Filed Vitals:   06/07/14 0900  BP:   Pulse:   Temp: 36.3 C  Resp:     Complications: No apparent anesthesia complications

## 2014-06-07 NOTE — H&P (Addendum)
73 yof who presented with a palpable mass found by Dr Helane Rima on routine exam. US showed a 2.4x1.6x1.3 cm mass. no abnormal nodes on ultrasound. MRI shows a larger mass measuring 5.3x2.9x2.3 cm in size with biopsy clip at anterior portion of this. There are no abnormal lymph nodes. The right breast is normal. Her breast density is either c or d depending on which report. She has undergone biopsy that shows DCIS that is er pos at 100 and pr negative. She had right breast mm abnormality that is benign and concordant. She has been seen in [facility] and requested another opinion. She was scheduled for bilateral mastectomies.  PMH:  Arthritis Asthma Breast Cancer Chest pain Diabetes Mellitus Gastroesophageal Reflux Disease High blood pressure Hypercholesterolemia Sleep Apnea  Past Surgical History  Appendectomy Breast Biopsy Bilateral. multiple Colon Polyp Removal - Colonoscopy Gallbladder Surgery - Open Hysterectomy (not due to cancer) - Partial  Allergies  Altace *ANTIHYPERTENSIVES* Aspirin Adult Low Dose *ANALGESICS - NonNarcotic* MetFORMIN HCl *ANTIDIABETICS*  Medication History  Norvasc (10MG  Tablet, Oral daily) Active. Lipitor (40MG  Tablet, Oral daily) Active. Doxycycline Hyclate (100MG  Capsule, Oral as needed) Active. Lasix (20MG  Tablet, Oral daily) Active. Levothyroxine Sodium (25MCG Tablet, Oral daily) Active. Cozaar (25MG  Tablet, Oral daily) Active. Toprol XL (100MG  Tablet ER 24HR, Oral daily) Active. PriLOSEC (40MG  Capsule DR, Oral daily) Active. Potassium Chloride (20MEQ Tablet ER, Oral daily) Active.  Social History  Caffeine use Coffee, Tea. No alcohol use No drug use Tobacco use Never smoker.  Family History Arthritis Father, Mother. Bleeding disorder Family Members In General. Breast Cancer Family Members In General. Cerebrovascular Accident Mother. Cervical Cancer Family Members In General. Diabetes Mellitus Mother,  Sister. Heart Disease Father. Heart disease in female family member before age 55 Melanoma Family Members In General. Ovarian Cancer Family Members In General. Respiratory Condition Family Members In General, Father. Seizure disorder Mother. Thyroid problems Mother.  Pregnancy / Birth History Age at menarche 82 years. Age of menopause 12-50 <45 Contraceptive History Oral contraceptives. Gravida 0 Para 0  Review of Systems General Present- Fatigue. Not Present- Appetite Loss, Chills, Fever, Night Sweats, Weight Gain and Weight Loss. HEENT Present- Earache. Not Present- Hearing Loss, Hoarseness, Nose Bleed, Oral Ulcers, Ringing in the Ears, Seasonal Allergies, Sinus Pain, Sore Throat, Visual Disturbances, Wears glasses/contact lenses and Yellow Eyes. Breast Present- Breast Mass and Breast Pain. Not Present- Nipple Discharge and Skin Changes. Gastrointestinal Present- Constipation. Not Present- Abdominal Pain, Bloating, Bloody Stool, Change in Bowel Habits, Chronic diarrhea, Difficulty Swallowing, Excessive gas, Gets full quickly at meals, Hemorrhoids, Indigestion, Nausea, Rectal Pain and Vomiting. Musculoskeletal Present- Joint Stiffness. Not Present- Back Pain, Joint Pain, Muscle Pain, Muscle Weakness and Swelling of Extremities. Hematology Present- Easy Bruising. Not Present- Excessive bleeding, Gland problems, HIV and Persistent Infections.   Vitals  Weight: 191.25 lb Height: 66in Body Surface Area: 2.01 m Body Mass Index: 30.87 kg/m Temp.: 98.3F  Pulse: 70 (Regular)  BP: 150/70 (Sitting, Left Arm, Standard)  Physical Exam  General Mental Status-Alert. Orientation-Oriented X3.  Chest and Lung Exam Chest and lung exam reveals -normal excursion with symmetric chest walls, quiet, even and easy respiratory effort with no use of accessory muscles and on auscultation, normal breath sounds, no adventitious sounds and normal vocal  resonance.  Breast Nipples Discharge - Bilateral - None. Breast - Right-Normal. Left breast exam reveals a 2.5 cm palpable mass in the upper outer quadrant, mobile. No axillary, Elton or cervical adenopathy.  Cardiovascular Cardiovascular examination reveals -normal heart sounds, regular  rate and rhythm with no murmurs.    Assessment & Plan  PRIMARY CANCER OF UPPER OUTER QUADRANT OF LEFT FEMALE BREAST (174.4  C50.412) Impression: Left total mastectomy, left axillary sn biopsy We discussed right side and fact that this will not change her survival. we will not do a bilateral mastectomy. I will also ask genetics if reasonable to see now given new information We discussed the staging and pathophysiology of breast cancer. We discussed all of the different options for treatment for breast cancer including surgery, chemotherapy, radiation therapy, Herceptin, and antiestrogen therapy. We discussed a sentinel lymph node biopsy as she does not appear to having lymph node involvement right now. We discussed the performance of that with injection of radioactive tracer.We discussed that there is a chance of having a positive node with a sentinel lymph node biopsy and we will await the permanent pathology to make any other first further decisions in terms of her treatment. One of these options might be to return to the operating room to perform an axillary lymph node dissection. We discussed up to a 5% risk lifetime of chronic shoulder pain as well as lymphedema associated with a sentinel lymph node biopsy. We discussed the options for treatment of the breast cancer which included lumpectomy versus a mastectomy. We discussed the performance of the lumpectomy with bracketed wire placement. We discussed a chance of a positive margin requiring reexcision in the operating room. We discussed the mastectomy and the postoperative care for that as well. We discussed that there is no difference in her survival  whether she undergoes lumpectomy with radiation therapy or antiestrogen therapy versus a mastectomy. I do think this area is a little large for lumpectomy. We discussed another biopsy to prove this but she would like to just undergo mastectomy. She does not want to consider reconstruction at this time. We discussed the risks of operation including bleeding, infection, possible reoperation. She understands her further therapy will be based on what her stages at the time of her operation.

## 2014-06-07 NOTE — Op Note (Signed)
Preoperative diagnosis: Left breast cancer, clinical stage 0 Postoperative diagnosis: Same as above Procedure: Left total mastectomy with left axillary sentinel lymph node biopsy Surgeon: Dr. Serita Grammes Anesthesia: Gen. With pectoral block Estimated blood loss: Minimal Complications: None Drains: 71 French Blake drain secured with 2-0 nylon Specimens: #1 left mastectomy #2. Left axillary sentinel node times two  Sponge and needle count correct times two Disposition stable to recovery  Indications: This is a 75 yof who has new left breast DCIS diagnosed by mammogram. She has palpable mass and larger on MRI.  We discussed all her options and have decided to proceed with mastectomy and sentinel node biopsy.  Procedure: After informed consent was obtained the patient was taken to the operating room. She was injected with technetium in the standard periareolar fashion. She was given cefazolin and SCDs were placed. She underwent a pectoral block. She was placed under general anesthesia without complication. She was prepped and draped in the standard sterile surgical fashion.  A surgical timeout was performed.  I made an elliptical incision encompassing the nipple and areola.  I then created flaps to the sternum, clavicle, inframammary crease and latissimus laterally. I then removed the breast from the fascia.  I then located two sentinel nodes and removed them. There was no background radioactivity.  I then obtained hemostasis, Irrigation was performed.  I then placed a 19 Fr Blake drain and secured this with a 2-0 nylon.  I then closed with 3-0 vicryl and 4-0 monocryl.  Glue and steristrips were placed. A breast binder was placed. She tolerated this well, was extubated and transferred to recovery stable.

## 2014-06-07 NOTE — Plan of Care (Signed)
Problem: Consults Goal: Modified Radical Mastectomy Patient Education (See Patient Education module for education specifics)  Outcome: Progressing Goal: Diagnosis-Modified Radical Mastectomy Outcome: Progressing Left Mastectomy Goal: Skin Care Protocol Initiated - if Braden Score 18 or less If consults are not indicated, leave blank or document N/A  Outcome: Progressing Goal: Nutrition Consult-if indicated Outcome: Progressing Goal: Diabetes Guidelines if Diabetic/Glucose > 140 If diabetic or lab glucose is > 140 mg/dl - Initiate Diabetes/Hyperglycemia Guidelines & Document Interventions  Outcome: Progressing  Problem: Phase I Progression Outcomes Goal: Absence of hematoma Outcome: Progressing Goal: Drain functioning/secure Outcome: Progressing Goal: Tolerating diet Outcome: Progressing Goal: Arm precautions in place Outcome: Progressing Goal: Dressings dry/intact Outcome: Progressing Goal: Pain controlled with appropriate interventions Outcome: Progressing Goal: OOB as tolerated unless otherwise ordered Outcome: Progressing Goal: Initial discharge plan identified Outcome: Progressing Goal: Voiding-avoid urinary catheter unless indicated Outcome: Progressing Goal: Hemodynamically stable Outcome: Progressing Goal: Other Phase I Outcomes/Goals Outcome: Progressing

## 2014-06-08 DIAGNOSIS — D0512 Intraductal carcinoma in situ of left breast: Secondary | ICD-10-CM | POA: Diagnosis not present

## 2014-06-08 LAB — BASIC METABOLIC PANEL
ANION GAP: 12 (ref 5–15)
BUN: 12 mg/dL (ref 6–23)
CALCIUM: 8.3 mg/dL — AB (ref 8.4–10.5)
CO2: 23 meq/L (ref 19–32)
Chloride: 99 mEq/L (ref 96–112)
Creatinine, Ser: 1.08 mg/dL (ref 0.50–1.10)
GFR calc Af Amer: 58 mL/min — ABNORMAL LOW (ref >90)
GFR, EST NON AFRICAN AMERICAN: 50 mL/min — AB (ref >90)
Glucose, Bld: 241 mg/dL — ABNORMAL HIGH (ref 70–99)
Potassium: 4.4 mEq/L (ref 3.7–5.3)
Sodium: 134 mEq/L — ABNORMAL LOW (ref 137–147)

## 2014-06-08 LAB — GLUCOSE, CAPILLARY
GLUCOSE-CAPILLARY: 264 mg/dL — AB (ref 70–99)
Glucose-Capillary: 207 mg/dL — ABNORMAL HIGH (ref 70–99)
Glucose-Capillary: 124 mg/dL — ABNORMAL HIGH (ref 70–99)
Glucose-Capillary: 200 mg/dL — ABNORMAL HIGH (ref 70–99)

## 2014-06-08 MED ORDER — OXYCODONE-ACETAMINOPHEN 10-325 MG PO TABS
1.0000 | ORAL_TABLET | Freq: Four times a day (QID) | ORAL | Status: DC | PRN
Start: 2014-06-08 — End: 2014-10-10

## 2014-06-08 MED ORDER — ALUM & MAG HYDROXIDE-SIMETH 200-200-20 MG/5ML PO SUSP
30.0000 mL | Freq: Four times a day (QID) | ORAL | Status: DC | PRN
  Administered 2014-06-08: 30 mL via ORAL
  Filled 2014-06-08: qty 30

## 2014-06-08 NOTE — Discharge Instructions (Signed)
[facility] °[phone] ° °MASTECTOMY: POST OP INSTRUCTIONS ° °Always review your discharge instruction sheet given to you by the facility where your surgery was performed. °IF YOU HAVE DISABILITY OR FAMILY LEAVE FORMS, YOU MUST BRING THEM TO THE OFFICE FOR PROCESSING.   °DO NOT GIVE THEM TO YOUR DOCTOR. °A prescription for pain medication may be given to you upon discharge.  Take your pain medication as prescribed, if needed.  If narcotic pain medicine is not needed, then you may take acetaminophen (Tylenol), naprosyn (Alleve) or ibuprofen (Advil) as needed. °1. Take your usually prescribed medications unless otherwise directed. °2. If you need a refill on your pain medication, please contact your pharmacy.  They will contact our office to request authorization.  Prescriptions will not be filled after 5pm or on week-ends. °3. You should follow a light diet the first few days after arrival home, such as soup and crackers, etc.  Resume your normal diet the day after surgery. °4. Most patients will experience some swelling and bruising on the chest and underarm.  Ice packs will help.  Swelling and bruising can take several days to resolve. Wear the binder day and night until you return to the office.  °5. It is common to experience some constipation if taking pain medication after surgery.  Increasing fluid intake and taking a stool softener (such as Colace) will usually help or prevent this problem from occurring.  A mild laxative (Milk of Magnesia or Miralax) should be taken according to package instructions if there are no bowel movements after 48 hours. °6. Unless discharge instructions indicate otherwise, leave your bandage dry and in place until your next appointment in 3-5 days.  You may take a limited sponge bath.  No tube baths or showers until the drains are removed.  You may have steri-strips (small skin tapes) in place directly over the incision.  These strips should be left on the  skin for 7-10 days. If you have glue it will come off in next couple week.  Any sutures will be removed at an office visit °7. DRAINS:  If you have drains in place, it is important to keep a list of the amount of drainage produced each day in your drains.  Before leaving the hospital, you should be instructed on drain care.  Call our office if you have any questions about your drains. I will remove your drains when they put out less than 30 cc or ml for 2 consecutive days. °8. ACTIVITIES:  You may resume regular (light) daily activities beginning the next day--such as daily self-care, walking, climbing stairs--gradually increasing activities as tolerated.  You may have sexual intercourse when it is comfortable.  Refrain from any heavy lifting or straining until approved by your doctor. °a. You may drive when you are no longer taking prescription pain medication, you can comfortably wear a seatbelt, and you can safely maneuver your car and apply brakes. °b. RETURN TO WORK:  __________________________________________________________ °9. You should see your doctor in the office for a follow-up appointment approximately 3-5 days after your surgery.  Your doctor’s nurse will typically make your follow-up appointment when she calls you with your pathology report.  Expect your pathology report 3-4business days after surgery. °10. OTHER INSTRUCTIONS: ______________________________________________________________________________________________ ____________________________________________________________________________________________ °WHEN TO CALL YOUR DR Delma Villalva: °1. Fever over 101.0 °2. Nausea and/or vomiting °3. Extreme swelling or bruising °4. Continued bleeding from incision. °5. Increased pain, redness, or drainage from the incision. °The clinic staff is available   to answer your questions during regular business hours.  Please don’t hesitate to call and ask to speak to one of the nurses for clinical concerns.  If  you have a medical emergency, go to the nearest emergency room or call 911.  A surgeon from [facility] is always on call at the hospital. °[address], [address], [place], [place]  [address] ? [address] [address], [place], [place]   [address] °[phone] ? [phone] ? FAX [phone] °Web site: [email or web address] ° °

## 2014-06-08 NOTE — Progress Notes (Signed)
1 Day Post-Op  Subjective: Pain controlled, otherwise well  Objective: Vital signs in last 24 hours: Temp:  [97.3 F (36.3 C)-99.8 F (37.7 C)] 99.8 F (37.7 C) (12/10 0628) Pulse Rate:  [48-75] 75 (12/10 0628) Resp:  [11-21] 17 (12/10 0628) BP: (125-160)/(46-67) 130/47 mmHg (12/10 0628) SpO2:  [92 %-99 %] 93 % (12/10 0628) Weight:  [192 lb (87.091 kg)] 192 lb (87.091 kg) (12/09 0853) Last BM Date: 06/07/14  Intake/Output from previous day: 12/09 0701 - 12/10 0700 In: 1837 [I.V.:1775; IV Piggyback:50] Out: 595 [Urine:400; Drains:95; Blood:100] Intake/Output this shift: Total I/O In: -  Out: 10 [Drains:10]  Left mastectomy without hematoma, drain serous   Lab Results:  No results for input(s): WBC, HGB, HCT, PLT in the last 72 hours. BMET  Recent Labs  06/08/14 0401  NA 134*  K 4.4  CL 99  CO2 23  GLUCOSE 241*  BUN 12  CREATININE 1.08  CALCIUM 8.3*   PT/INR No results for input(s): LABPROT, INR in the last 72 hours. ABG No results for input(s): PHART, HCO3 in the last 72 hours.  Invalid input(s): PCO2, PO2  Studies/Results: Nm Sentinel Node Inj-no Rpt (breast)  06/07/2014   CLINICAL DATA: cancer left breast   Sulfur colloid was injected intradermally by the nuclear medicine  technologist for breast cancer sentinel node localization.     Anti-infectives: Anti-infectives    Start     Dose/Rate Route Frequency Ordered Stop   06/07/14 1930  ceFAZolin (ANCEF) IVPB 2 g/50 mL premix     2 g100 mL/hr over 30 Minutes Intravenous 3 times per day 06/07/14 1458 06/08/14 0629   06/07/14 0600  ceFAZolin (ANCEF) IVPB 2 g/50 mL premix     2 g100 mL/hr over 30 Minutes Intravenous On call to O.R. 06/06/14 1418 06/07/14 1126      Assessment/Plan: POD 1 left mastectomy/snbx   Will dc home today if pain controlled, tol diet, ambulating  Julia Banks 06/08/2014

## 2014-06-08 NOTE — Progress Notes (Signed)
Inpatient Diabetes Program Recommendations  AACE/ADA: New Consensus Statement on Inpatient Glycemic Control (2013)  Target Ranges:  Prepandial:   less than 140 mg/dL      Peak postprandial:   less than 180 mg/dL (1-2 hours)      Critically ill patients:  140 - 180 mg/dL   Results for GIANNAH, ZAVADIL (MRN [id number]) as of 06/08/2014 12:24  Ref. Range 06/07/2014 09:02 06/07/2014 14:29 06/07/2014 17:07 06/07/2014 22:55 06/08/2014 07:46 06/08/2014 11:49  Glucose-Capillary Latest Range: 70-99 mg/dL 202 (H) 211 (H) 207 (H) 124 (H) 200 (H) 264 (H)   Diabetes history: DM Outpatient Diabetes medications: 70/30 40 units QAM, 70/30 20-30 units QPM Current orders for Inpatient glycemic control: Novolog 0-15 units TID with meals  Inpatient Diabetes Program Recommendations Insulin - Basal: If patient is not discharged today, please consider ordering basal insulin.  Thanks, Barnie Alderman, RN, MSN, CCRN, CDE Diabetes Coordinator Inpatient Diabetes Program [phone] (Team Pager) [phone] ([facility] office) [phone] [facility] office)

## 2014-06-08 NOTE — Progress Notes (Signed)
CBG(capillary blood glucose) taken at 2230 is 124

## 2014-06-09 ENCOUNTER — Encounter (HOSPITAL_COMMUNITY): Payer: Self-pay | Admitting: General Surgery

## 2014-06-12 NOTE — Discharge Summary (Signed)
Physician Discharge Summary  Patient ID: Julia Banks MRN: [id number] DOB/AGE: Jan 03, 1941 73 y.o.  Admit date: 06/07/2014 Discharge date: 06/12/2014  Admission Diagnoses: Left breast dcis Discharge Diagnoses:  Active Problems:   S/P mastectomy   Discharged Condition: good  Hospital Course: 5 yof underwent left breast mastectomy and sn biopsy for dcis. She did well and was discharged home following day.  Consults: None  Significant Diagnostic Studies: none  Treatments: surgery: left total mastectomy and sn biopsy    Disposition: 01-Home or Self Care     Medication List    TAKE these medications        ACCU-CHEK AVIVA PLUS test strip  Generic drug:  glucose blood  USE 4/DAY TO CHECK BLOOD SUGARS.     amLODipine 10 MG tablet  Commonly known as:  NORVASC  TAKE 1 TABLET BY MOUTH EVERY DAY FOR BLOOD PRESSURE     atorvastatin 40 MG tablet  Commonly known as:  LIPITOR  TAKE 1 TABLET (40 MG TOTAL) BY MOUTH DAILY.     B-D UF III MINI PEN NEEDLES 31G X 5 MM Misc  Generic drug:  Insulin Pen Needle  USE 4 TIMES DAILY AS DIRECTED     doxycycline 100 MG capsule  Commonly known as:  VIBRAMYCIN  Take 100 mg by mouth daily as needed (FOR FACE).     FINACEA 15 % cream  Generic drug:  Azelaic Acid  Apply 1 application topically at bedtime.     fluconazole 150 MG tablet  Commonly known as:  DIFLUCAN  Take 150 mg by mouth daily as needed (TAKE AS NEEDED WHEN TAKING AN ANTIBIOTIC).     furosemide 20 MG tablet  Commonly known as:  LASIX  TAKE 1/2 TABLET (10 MG TOTAL) BY MOUTH DAILY for blood pressure     ibuprofen 200 MG tablet  Commonly known as:  ADVIL,MOTRIN  Take 200 mg by mouth every 6 (six) hours as needed for headache or mild pain.     insulin aspart protamine- aspart (70-30) 100 UNIT/ML injection  Commonly known as:  NOVOLOG MIX 70/30  Inject 20-40 Units into the skin 2 (two) times daily with a meal. 40units with breakfast  and 20-30units at evening meal      levothyroxine 25 MCG tablet  Commonly known as:  SYNTHROID, LEVOTHROID  TAKE 1 TABLET (25 MCG TOTAL) BY MOUTH DAILY BEFORE BREAKFAST.     losartan 25 MG tablet  Commonly known as:  COZAAR  Take 25 mg by mouth daily.     metoprolol succinate 100 MG 24 hr tablet  Commonly known as:  TOPROL-XL  TAKE 1 TABLET BY MOUTH DAILY. TAKE WITH OR IMMEDIATELY FOLLOWING A MEAL.     omeprazole 40 MG capsule  Commonly known as:  PRILOSEC  TAKE 1 CAPSULE (40 MG TOTAL) BY MOUTH DAILY AS NEEDED.     OVACE PLUS 10 % Crea  Generic drug:  Sulfacetamide Sodium  Apply 1 application topically daily as needed (to face).     oxyCODONE-acetaminophen 10-325 MG per tablet  Commonly known as:  PERCOCET  Take 1 tablet by mouth every 6 (six) hours as needed for pain.     potassium chloride SA 20 MEQ tablet  Commonly known as:  KLOR-CON M20  TAKE 1 TABLET (20 MEQ TOTAL) BY MOUTH DAILY.           Follow-up Information    Follow up with [facility], MD In 1 week.   Specialty:  General Surgery  Contact information:   [address] [place] [address] [phone]       Signed: Rolm Bookbinder 06/12/2014, 9:01 PM

## 2014-06-14 ENCOUNTER — Telehealth: Payer: Self-pay

## 2014-06-14 NOTE — Telephone Encounter (Signed)
Order faxed to [facility] [employer] for [id number] and prosthesis.  Sent to scan.

## 2014-06-20 ENCOUNTER — Ambulatory Visit (HOSPITAL_BASED_OUTPATIENT_CLINIC_OR_DEPARTMENT_OTHER): Payer: Medicare Other | Admitting: Hematology and Oncology

## 2014-06-20 ENCOUNTER — Telehealth: Payer: Self-pay | Admitting: Hematology and Oncology

## 2014-06-20 VITALS — BP 155/73 | HR 73 | Temp 97.8°F | Resp 18 | Ht 66.0 in | Wt 197.3 lb

## 2014-06-20 DIAGNOSIS — I2510 Atherosclerotic heart disease of native coronary artery without angina pectoris: Secondary | ICD-10-CM | POA: Diagnosis not present

## 2014-06-20 DIAGNOSIS — C50412 Malignant neoplasm of upper-outer quadrant of left female breast: Secondary | ICD-10-CM | POA: Diagnosis not present

## 2014-06-20 MED ORDER — ANASTROZOLE 1 MG PO TABS: 1.0000 mg | ORAL_TABLET | Freq: Every day | ORAL | Status: DC

## 2014-06-20 NOTE — Assessment & Plan Note (Addendum)
Left breast DCIS grade 1, 3.8 cm, 4 lymph nodes negative, ER 93%, PR 0% Pathology counseling: I discussed the pathology report in great detail including the fact that the margins are negative and lymph nodes are negative.  Recommendation: Antiestrogen therapy with Arimidex 1 mg daily for 5 years We discussed the risks and benefits of anti-estrogen therapy with aromatase inhibitors. These include but not limited to insomnia, hot flashes, mood changes, vaginal dryness, bone density loss, and weight gain. Although rare, serious side effects including risk of blood clots were also discussed. We strongly believe that the benefits far outweigh the risks. Patient understands these risks and consented to starting treatment. Planned treatment duration is 5 years. I recommended that she get a bone density test with her GYN every other year. Apparently previous bone density test had been normal.  Return to clinic in 3 months for follow-up and toxicity evaluation. Will closely monitor her for toxicities to Arimidex.

## 2014-06-20 NOTE — Progress Notes (Signed)
Patient Care Team: Ria Bush, MD as PCP - General (Family Medicine) Fanny Skates, MD as Consulting Physician (General Surgery) Rulon Eisenmenger, MD as Consulting Physician (Hematology and Oncology) Rexene Edison, MD as Consulting Physician (Radiation Oncology)  DIAGNOSIS: Breast cancer of upper-outer quadrant of left female breast   Staging form: Breast, AJCC 7th Edition     Clinical: Stage 0 (Tis (DCIS), N0, cM0) - Unsigned       Staging comments: Staged at breast conference 04/05/14.      Pathologic: Stage 0 (Tis (DCIS), N0, cM0) - Signed by Rulon Eisenmenger, MD on 06/13/2014       Staging comments: Staged from final excisional pathology by Dr. Donato Heinz    SUMMARY OF ONCOLOGIC HISTORY:   Breast cancer of upper-outer quadrant of left female breast   12/02/2013 Mammogram Benign right breast calcifications unchanged from before 4 month followup recommended   03/24/2014 Initial Diagnosis Breast cancer of upper-outer quadrant of left female breast: DCIS with papillary features, ER 93% PR 0%   04/04/2014 Breast MRI Left breast upper outer quadrant posterior third irregular enhancing mass 5.3 x 2.0 x 2.3 cm: No abnormalities in the right breast or lymph nodes   04/06/2014 Initial Biopsy Right breast biopsy: Benign   06/07/2014 Surgery Left breast mastectomy: DCIS, grade 1, 3.8 cm in size, 4 lymph nodes negative, ER 93%, by mouth 0%    CHIEF COMPLIANT: Follow-up after mastectomy  INTERVAL HISTORY: Julia Banks is a 73 year old lady with above-mentioned history of DCIS involving the left breast treated with left mastectomy on 06/07/2014. She is here to discuss pathology and to discuss adjuvant treatment options. She is still slightly sore from the recent surgery but otherwise recovering and healing well. She is accompanied by her daughter.  REVIEW OF SYSTEMS:   Constitutional: Denies fevers, chills or abnormal weight loss Eyes: Denies blurriness of vision Ears, nose, mouth, throat, and  face: Denies mucositis or sore throat Respiratory: Denies cough, dyspnea or wheezes Cardiovascular: Denies palpitation, chest discomfort or lower extremity swelling Gastrointestinal:  Denies nausea, heartburn or change in bowel habits Skin: Denies abnormal skin rashes Lymphatics: Denies new lymphadenopathy or easy bruising Neurological:Denies numbness, tingling or new weaknesses Behavioral/Psych: Mood is stable, no new changes  Breast: Soreness in the breast from recent surgery All other systems were reviewed with the patient and are negative.  I have reviewed the past medical history, past surgical history, social history and family history with the patient and they are unchanged from previous note.  ALLERGIES:  is allergic to altace; aspirin; and metformin and related.  MEDICATIONS:  Current Outpatient Prescriptions  Medication Sig Dispense Refill  . ACCU-CHEK AVIVA PLUS test strip USE 4/DAY TO CHECK BLOOD SUGARS. 150 each 2  . amLODipine (NORVASC) 10 MG tablet TAKE 1 TABLET BY MOUTH EVERY DAY FOR BLOOD PRESSURE 90 tablet 1  . atorvastatin (LIPITOR) 40 MG tablet TAKE 1 TABLET (40 MG TOTAL) BY MOUTH DAILY. 90 tablet 1  . B-D UF III MINI PEN NEEDLES 31G X 5 MM MISC USE 4 TIMES DAILY AS DIRECTED 200 each 5  . doxycycline (VIBRAMYCIN) 100 MG capsule Take 100 mg by mouth daily as needed (FOR FACE).     Marland Kitchen FINACEA 15 % cream Apply 1 application topically at bedtime.   3  . fluconazole (DIFLUCAN) 150 MG tablet Take 150 mg by mouth daily as needed (TAKE AS NEEDED WHEN TAKING AN ANTIBIOTIC).   6  . furosemide (LASIX) 20 MG tablet TAKE 1/2  TABLET (10 MG TOTAL) BY MOUTH DAILY for blood pressure 30 tablet 6  . ibuprofen (ADVIL,MOTRIN) 200 MG tablet Take 200 mg by mouth every 6 (six) hours as needed for headache or mild pain.    Marland Kitchen insulin aspart protamine- aspart (NOVOLOG MIX 70/30) (70-30) 100 UNIT/ML injection Inject 20-40 Units into the skin 2 (two) times daily with a meal. 40units with breakfast   and 20-30units at evening meal    . lactulose (CHRONULAC) 10 GM/15ML solution   0  . levothyroxine (SYNTHROID, LEVOTHROID) 25 MCG tablet TAKE 1 TABLET (25 MCG TOTAL) BY MOUTH DAILY BEFORE BREAKFAST. 90 tablet 1  . losartan (COZAAR) 25 MG tablet Take 25 mg by mouth daily.    . metoprolol succinate (TOPROL-XL) 100 MG 24 hr tablet TAKE 1 TABLET BY MOUTH DAILY. TAKE WITH OR IMMEDIATELY FOLLOWING A MEAL. 90 tablet 1  . omeprazole (PRILOSEC) 40 MG capsule TAKE 1 CAPSULE (40 MG TOTAL) BY MOUTH DAILY AS NEEDED. 90 capsule 1  . OVACE PLUS 10 % CREA Apply 1 application topically daily as needed (to face).   2  . oxyCODONE-acetaminophen (PERCOCET) 10-325 MG per tablet Take 1 tablet by mouth every 6 (six) hours as needed for pain. 30 tablet 0  . potassium chloride SA (KLOR-CON M20) 20 MEQ tablet TAKE 1 TABLET (20 MEQ TOTAL) BY MOUTH DAILY. 30 tablet 6  . anastrozole (ARIMIDEX) 1 MG tablet Take 1 tablet (1 mg total) by mouth daily. 90 tablet 3   No current facility-administered medications for this visit.    PHYSICAL EXAMINATION: ECOG PERFORMANCE STATUS: 1 - Symptomatic but completely ambulatory  Filed Vitals:   06/20/14 1129  BP: 155/73  Pulse: 73  Temp: 97.8 F (36.6 C)  Resp: 18   Filed Weights   06/20/14 1129  Weight: 197 lb 4.8 oz (89.495 kg)    GENERAL:alert, no distress and comfortable SKIN: skin color, texture, turgor are normal, no rashes or significant lesions EYES: normal, Conjunctiva are pink and non-injected, sclera clear OROPHARYNX:no exudate, no erythema and lips, buccal mucosa, and tongue normal  NECK: supple, thyroid normal size, non-tender, without nodularity LYMPH:  no palpable lymphadenopathy in the cervical, axillary or inguinal LUNGS: clear to auscultation and percussion with normal breathing effort HEART: regular rate & rhythm and no murmurs and no lower extremity edema ABDOMEN:abdomen soft, non-tender and normal bowel sounds Musculoskeletal:no cyanosis of digits and  no clubbing  NEURO: alert & oriented x 3 with fluent speech, no focal motor/sensory deficits  LABORATORY DATA:  I have reviewed the data as listed   Chemistry      Component Value Date/Time   NA 134* 06/08/2014 0401   NA 139 04/05/2014 0820   NA 139 11/01/2013 1644   K 4.4 06/08/2014 0401   K 4.3 04/05/2014 0820   K 3.9 03/22/2013   CL 99 06/08/2014 0401   CO2 23 06/08/2014 0401   CO2 24 04/05/2014 0820   BUN 12 06/08/2014 0401   BUN 20.4 04/05/2014 0820   BUN 11 11/01/2013 1644   CREATININE 1.08 06/08/2014 0401   CREATININE 1.3* 04/05/2014 0820   CREATININE 1.35 03/22/2013      Component Value Date/Time   CALCIUM 8.3* 06/08/2014 0401   CALCIUM 10.0 04/05/2014 0820   ALKPHOS 113 05/31/2014 1059   ALKPHOS 124 04/05/2014 0820   ALKPHOS 113 03/22/2013   AST 16 05/31/2014 1059   AST 14 04/05/2014 0820   AST 18 03/22/2013   ALT 14 05/31/2014 1059   ALT  16 04/05/2014 0820   BILITOT 0.6 05/31/2014 1059   BILITOT 0.58 04/05/2014 0820   BILITOT 0.9 03/22/2013       Lab Results  Component Value Date   WBC 9.6 05/31/2014   HGB 13.3 05/31/2014   HCT 39.5 05/31/2014   MCV 84.9 05/31/2014   PLT 199 05/31/2014   NEUTROABS 6.3 05/31/2014    ASSESSMENT & PLAN:  Breast cancer of upper-outer quadrant of left female breast Left breast DCIS grade 1, 3.8 cm, 4 lymph nodes negative, ER 93%, PR 0% Pathology counseling: I discussed the pathology report in great detail including the fact that the margins are negative and lymph nodes are negative.  Recommendation: Antiestrogen therapy with Arimidex 1 mg daily for 5 years We discussed the risks and benefits of anti-estrogen therapy with aromatase inhibitors. These include but not limited to insomnia, hot flashes, mood changes, vaginal dryness, bone density loss, and weight gain. Although rare, serious side effects including risk of blood clots were also discussed. We strongly believe that the benefits far outweigh the risks. Patient  understands these risks and consented to starting treatment. Planned treatment duration is 5 years. I recommended that she get a bone density test with her GYN every other year. Apparently previous bone density test had been normal.  Return to clinic in 3 months for follow-up and toxicity evaluation. Will closely monitor her for toxicities to Arimidex.   No orders of the defined types were placed in this encounter.   The patient has a good understanding of the overall plan. she agrees with it. She will call with any problems that may develop before her next visit here.   Rulon Eisenmenger, MD 06/20/2014 12:01 PM

## 2014-06-20 NOTE — Telephone Encounter (Signed)
, °

## 2014-06-25 ENCOUNTER — Other Ambulatory Visit: Payer: Self-pay | Admitting: Endocrinology

## 2014-06-25 ENCOUNTER — Other Ambulatory Visit: Payer: Self-pay | Admitting: Family Medicine

## 2014-06-26 ENCOUNTER — Other Ambulatory Visit: Payer: Self-pay | Admitting: *Deleted

## 2014-06-26 MED ORDER — INSULIN ASPART PROT & ASPART (70-30 MIX) 100 UNIT/ML ~~LOC~~ SUSP: SUBCUTANEOUS | Status: DC

## 2014-06-27 ENCOUNTER — Encounter: Payer: Medicare Other | Admitting: Family Medicine

## 2014-06-28 ENCOUNTER — Telehealth: Payer: Self-pay | Admitting: Physical Therapy

## 2014-06-28 NOTE — Telephone Encounter (Signed)
Pt called and cancelled appt for 12/31, pt did not r/s , notified [facility] @ Dr. Cristal Generous office [facility]

## 2014-06-29 ENCOUNTER — Ambulatory Visit: Payer: Medicare Other | Admitting: Physical Therapy

## 2014-07-18 ENCOUNTER — Ambulatory Visit
Admission: RE | Admit: 2014-07-18 | Discharge: 2014-07-18 | Disposition: A | Discharge status: Home / Self Care | Payer: PRIVATE HEALTH INSURANCE | Source: Ambulatory Visit | Attending: General Surgery | Admitting: General Surgery

## 2014-07-18 ENCOUNTER — Other Ambulatory Visit (INDEPENDENT_AMBULATORY_CARE_PROVIDER_SITE_OTHER): Payer: Self-pay | Admitting: General Surgery

## 2014-07-18 DIAGNOSIS — C50919 Malignant neoplasm of unspecified site of unspecified female breast: Secondary | ICD-10-CM | POA: Diagnosis not present

## 2014-07-18 DIAGNOSIS — C50412 Malignant neoplasm of upper-outer quadrant of left female breast: Secondary | ICD-10-CM

## 2014-07-24 ENCOUNTER — Other Ambulatory Visit: Payer: Self-pay | Admitting: Endocrinology

## 2014-07-26 ENCOUNTER — Telehealth: Payer: Self-pay | Admitting: Physical Therapy

## 2014-07-26 NOTE — Telephone Encounter (Signed)
Talked to pt and she wants to wait for a month before coming to [facility] clinic

## 2014-07-31 DIAGNOSIS — M8580 Other specified disorders of bone density and structure, unspecified site: Secondary | ICD-10-CM

## 2014-07-31 HISTORY — DX: Other specified disorders of bone density and structure, unspecified site: M85.80

## 2014-08-08 DIAGNOSIS — N958 Other specified menopausal and perimenopausal disorders: Secondary | ICD-10-CM | POA: Diagnosis not present

## 2014-08-08 DIAGNOSIS — M8588 Other specified disorders of bone density and structure, other site: Secondary | ICD-10-CM | POA: Diagnosis not present

## 2014-08-14 DIAGNOSIS — E119 Type 2 diabetes mellitus without complications: Secondary | ICD-10-CM | POA: Diagnosis not present

## 2014-08-14 LAB — HM DIABETES EYE EXAM

## 2014-08-21 DIAGNOSIS — E039 Hypothyroidism, unspecified: Secondary | ICD-10-CM | POA: Diagnosis not present

## 2014-08-21 DIAGNOSIS — E559 Vitamin D deficiency, unspecified: Secondary | ICD-10-CM | POA: Diagnosis not present

## 2014-09-10 ENCOUNTER — Encounter: Payer: Self-pay | Admitting: Family Medicine

## 2014-09-19 ENCOUNTER — Ambulatory Visit (HOSPITAL_BASED_OUTPATIENT_CLINIC_OR_DEPARTMENT_OTHER): Payer: Medicare Other | Admitting: Hematology and Oncology

## 2014-09-19 VITALS — BP 136/60 | HR 80 | Temp 97.6°F | Ht 66.0 in | Wt 198.1 lb

## 2014-09-19 DIAGNOSIS — R0789 Other chest pain: Secondary | ICD-10-CM | POA: Diagnosis not present

## 2014-09-19 DIAGNOSIS — Z79810 Long term (current) use of selective estrogen receptor modulators (SERMs): Secondary | ICD-10-CM

## 2014-09-19 DIAGNOSIS — M791 Myalgia: Secondary | ICD-10-CM

## 2014-09-19 DIAGNOSIS — C50412 Malignant neoplasm of upper-outer quadrant of left female breast: Secondary | ICD-10-CM

## 2014-09-19 DIAGNOSIS — Z853 Personal history of malignant neoplasm of breast: Secondary | ICD-10-CM | POA: Diagnosis not present

## 2014-09-19 DIAGNOSIS — R51 Headache: Secondary | ICD-10-CM

## 2014-09-19 DIAGNOSIS — N951 Menopausal and female climacteric states: Secondary | ICD-10-CM | POA: Diagnosis not present

## 2014-09-19 NOTE — Progress Notes (Signed)
Patient Care Team: Ria Bush, MD as PCP - General (Family Medicine) Fanny Skates, MD as Consulting Physician (General Surgery) Nicholas Lose, MD as Consulting Physician (Hematology and Oncology) Arloa Koh, MD as Consulting Physician (Radiation Oncology)  DIAGNOSIS: Breast cancer of upper-outer quadrant of left female breast   Staging form: Breast, AJCC 7th Edition     Clinical: Stage 0 (Tis (DCIS), N0, cM0) - Unsigned       Staging comments: Staged at breast conference 04/05/14.      Pathologic: Stage 0 (Tis (DCIS), N0, cM0) - Signed by Rulon Eisenmenger, MD on 06/13/2014       Staging comments: Staged from final excisional pathology by Dr. Donato Heinz    SUMMARY OF ONCOLOGIC HISTORY:   Breast cancer of upper-outer quadrant of left female breast   12/02/2013 Mammogram Benign right breast calcifications unchanged from before 4 month followup recommended   03/24/2014 Initial Diagnosis Breast cancer of upper-outer quadrant of left female breast: DCIS with papillary features, ER 93% PR 0%   04/04/2014 Breast MRI Left breast upper outer quadrant posterior third irregular enhancing mass 5.3 x 2.0 x 2.3 cm: No abnormalities in the right breast or lymph nodes   04/06/2014 Initial Biopsy Right breast biopsy: Benign   06/07/2014 Surgery Left breast mastectomy: DCIS, grade 1, 3.8 cm in size, 4 lymph nodes negative, ER 93%, by mouth 0%    CHIEF COMPLIANT: Follow-up on anastrozole  INTERVAL HISTORY: Julia Banks is a 74 year old with above-mentioned history of left breast cancer treated with mastectomy and is now on oral antiestrogen therapy with Arimidex. She complains that since she started Arimidex she is having a lot of muscle aches and pains as well as intermittent hot flashes. Also having intermittent headaches and occasional chest discomfort. She had a previous heart catheterization which was apparently normal. So she does not think it is related to her heart. It is usually sudden and it lasts  only a few seconds. Denies any radiation of this pain.  REVIEW OF SYSTEMS:   Constitutional: Denies fevers, chills or abnormal weight loss, hot flashes muscle aches and pains Eyes: Denies blurriness of vision Ears, nose, mouth, throat, and face: Denies mucositis or sore throat Respiratory: Denies cough, dyspnea or wheezes Cardiovascular: Denies palpitation, chest discomfort or lower extremity swelling Gastrointestinal:  Denies nausea, heartburn or change in bowel habits Skin: Denies abnormal skin rashes Lymphatics: Denies new lymphadenopathy or easy bruising Neurological:Denies numbness, tingling or new weaknesses Behavioral/Psych: Mood is stable, no new changes  Breast:  denies any pain or lumps or nodules in either breasts All other systems were reviewed with the patient and are negative.  I have reviewed the past medical history, past surgical history, social history and family history with the patient and they are unchanged from previous note.  ALLERGIES:  is allergic to altace; aspirin; and metformin and related.  MEDICATIONS:  Current Outpatient Prescriptions  Medication Sig Dispense Refill  . ACCU-CHEK AVIVA PLUS test strip USE 4/DAY TO CHECK BLOOD SUGARS. 150 each 2  . amLODipine (NORVASC) 10 MG tablet TAKE 1 TABLET BY MOUTH EVERY DAY FOR BLOOD PRESSURE 90 tablet 1  . anastrozole (ARIMIDEX) 1 MG tablet Take 1 tablet (1 mg total) by mouth daily. 90 tablet 3  . atorvastatin (LIPITOR) 40 MG tablet TAKE 1 TABLET (40 MG TOTAL) BY MOUTH DAILY. 90 tablet 1  . B-D UF III MINI PEN NEEDLES 31G X 5 MM MISC USE 4 TIMES DAILY AS DIRECTED 200 each 5  . doxycycline (  VIBRAMYCIN) 100 MG capsule Take 100 mg by mouth daily as needed (FOR FACE).     Marland Kitchen FINACEA 15 % cream Apply 1 application topically at bedtime.   3  . fluconazole (DIFLUCAN) 150 MG tablet Take 150 mg by mouth daily as needed (TAKE AS NEEDED WHEN TAKING AN ANTIBIOTIC).   6  . furosemide (LASIX) 20 MG tablet TAKE 1/2 TABLET (10 MG  TOTAL) BY MOUTH DAILY for blood pressure 30 tablet 6  . ibuprofen (ADVIL,MOTRIN) 200 MG tablet Take 200 mg by mouth every 6 (six) hours as needed for headache or mild pain.    Marland Kitchen insulin aspart protamine- aspart (NOVOLOG MIX 70/30) (70-30) 100 UNIT/ML injection Inject 67 units with breakfast  and 42 units at evening meal 40 mL 0  . KLOR-CON M20 20 MEQ tablet TAKE 1 TABLET (20 MEQ TOTAL) BY MOUTH DAILY. 30 tablet 6  . lactulose (CHRONULAC) 10 GM/15ML solution   0  . levothyroxine (SYNTHROID, LEVOTHROID) 25 MCG tablet TAKE 1 TABLET (25 MCG TOTAL) BY MOUTH DAILY BEFORE BREAKFAST. 90 tablet 1  . losartan (COZAAR) 25 MG tablet Take 25 mg by mouth daily.    . metoprolol succinate (TOPROL-XL) 100 MG 24 hr tablet TAKE 1 TABLET BY MOUTH DAILY. TAKE WITH OR IMMEDIATELY FOLLOWING A MEAL. 90 tablet 1  . omeprazole (PRILOSEC) 40 MG capsule TAKE 1 CAPSULE (40 MG TOTAL) BY MOUTH DAILY AS NEEDED. 90 capsule 1  . OVACE PLUS 10 % CREA Apply 1 application topically daily as needed (to face).   2  . oxyCODONE-acetaminophen (PERCOCET) 10-325 MG per tablet Take 1 tablet by mouth every 6 (six) hours as needed for pain. 30 tablet 0   No current facility-administered medications for this visit.    PHYSICAL EXAMINATION: ECOG PERFORMANCE STATUS: 1 - Symptomatic but completely ambulatory  Filed Vitals:   09/19/14 1149  BP: 136/60  Pulse: 80  Temp: 97.6 F (36.4 C)   Filed Weights   09/19/14 1149  Weight: 198 lb 2 oz (89.869 kg)    GENERAL:alert, no distress and comfortable SKIN: skin color, texture, turgor are normal, no rashes or significant lesions EYES: normal, Conjunctiva are pink and non-injected, sclera clear OROPHARYNX:no exudate, no erythema and lips, buccal mucosa, and tongue normal  NECK: supple, thyroid normal size, non-tender, without nodularity LYMPH:  no palpable lymphadenopathy in the cervical, axillary or inguinal LUNGS: clear to auscultation and percussion with normal breathing effort HEART:  regular rate & rhythm and no murmurs and no lower extremity edema ABDOMEN:abdomen soft, non-tender and normal bowel sounds Musculoskeletal:no cyanosis of digits and no clubbing  NEURO: alert & oriented x 3 with fluent speech, no focal motor/sensory deficits  LABORATORY DATA:  I have reviewed the data as listed   Chemistry      Component Value Date/Time   NA 134* 06/08/2014 0401   NA 139 04/05/2014 0820   NA 139 11/01/2013 1644   K 4.4 06/08/2014 0401   K 4.3 04/05/2014 0820   K 3.9 03/22/2013   CL 99 06/08/2014 0401   CO2 23 06/08/2014 0401   CO2 24 04/05/2014 0820   BUN 12 06/08/2014 0401   BUN 20.4 04/05/2014 0820   BUN 11 11/01/2013 1644   CREATININE 1.08 06/08/2014 0401   CREATININE 1.3* 04/05/2014 0820   CREATININE 1.35 03/22/2013      Component Value Date/Time   CALCIUM 8.3* 06/08/2014 0401   CALCIUM 10.0 04/05/2014 0820   ALKPHOS 113 05/31/2014 1059   ALKPHOS 124 04/05/2014  0820   ALKPHOS 113 03/22/2013   AST 16 05/31/2014 1059   AST 14 04/05/2014 0820   AST 18 03/22/2013   ALT 14 05/31/2014 1059   ALT 16 04/05/2014 0820   BILITOT 0.6 05/31/2014 1059   BILITOT 0.58 04/05/2014 0820   BILITOT 0.9 03/22/2013       Lab Results  Component Value Date   WBC 9.6 05/31/2014   HGB 13.3 05/31/2014   HCT 39.5 05/31/2014   MCV 84.9 05/31/2014   PLT 199 05/31/2014   NEUTROABS 6.3 05/31/2014    ASSESSMENT & PLAN:  Breast cancer of upper-outer quadrant of left female breast Left breast DCIS grade 1, 3.8 cm, 4 lymph nodes negative, ER 93%, PR 0% Pathology counseling: I discussed the pathology report in great detail including the fact that the margins are negative and lymph nodes are negative. Started anastrozole 1 mg daily 06/18/2014, plan is for 5 years  Anastrozole toxicities: 1. Hot flashes 2-3 times a week 2. Muscle stiffness and achiness 3. Intermittent headaches and epigastric chest discomfort  Breast cancer surveillance: 1. Breast exam every 6 months 2.  Mammograms annually  Bone density test revealed osteopenia according to her as done by her primary care physician. Her calcium intake has been increased.  Return to clinic in 6 months for follow-up  No orders of the defined types were placed in this encounter.   The patient has a good understanding of the overall plan. she agrees with it. She will call with any problems that may develop before her next visit here.   Rulon Eisenmenger, MD

## 2014-09-19 NOTE — Assessment & Plan Note (Signed)
Left breast DCIS grade 1, 3.8 cm, 4 lymph nodes negative, ER 93%, PR 0% Pathology counseling: I discussed the pathology report in great detail including the fact that the margins are negative and lymph nodes are negative. Started anastrozole 1 mg daily 06/18/2014, plan is for 5 years  Anastrozole toxicities: 1. Hot flashes 2-3 times a week 2. Muscle stiffness and achiness 3. Intermittent headaches and epigastric chest discomfort  Breast cancer surveillance: 1. Breast exam every 6 months 2. Mammograms annually  Bone density test revealed osteopenia according to her as done by her primary care physician. Her calcium intake has been increased.  Return to clinic in 6 months for follow-up

## 2014-09-21 ENCOUNTER — Other Ambulatory Visit: Payer: Self-pay | Admitting: *Deleted

## 2014-09-21 MED ORDER — FUROSEMIDE 20 MG PO TABS: ORAL_TABLET | ORAL | Status: DC

## 2014-09-21 MED ORDER — POTASSIUM CHLORIDE CRYS ER 20 MEQ PO TBCR: EXTENDED_RELEASE_TABLET | ORAL | Status: DC

## 2014-10-01 ENCOUNTER — Other Ambulatory Visit: Payer: Self-pay | Admitting: Family Medicine

## 2014-10-01 DIAGNOSIS — N2581 Secondary hyperparathyroidism of renal origin: Secondary | ICD-10-CM

## 2014-10-01 DIAGNOSIS — I10 Essential (primary) hypertension: Secondary | ICD-10-CM

## 2014-10-01 DIAGNOSIS — E1140 Type 2 diabetes mellitus with diabetic neuropathy, unspecified: Secondary | ICD-10-CM

## 2014-10-01 DIAGNOSIS — E785 Hyperlipidemia, unspecified: Secondary | ICD-10-CM

## 2014-10-01 DIAGNOSIS — IMO0002 Reserved for concepts with insufficient information to code with codable children: Secondary | ICD-10-CM

## 2014-10-01 DIAGNOSIS — E1165 Type 2 diabetes mellitus with hyperglycemia: Principal | ICD-10-CM

## 2014-10-01 DIAGNOSIS — N183 Chronic kidney disease, stage 3 unspecified: Secondary | ICD-10-CM

## 2014-10-01 DIAGNOSIS — E039 Hypothyroidism, unspecified: Secondary | ICD-10-CM

## 2014-10-01 DIAGNOSIS — E1122 Type 2 diabetes mellitus with diabetic chronic kidney disease: Secondary | ICD-10-CM

## 2014-10-03 ENCOUNTER — Other Ambulatory Visit (INDEPENDENT_AMBULATORY_CARE_PROVIDER_SITE_OTHER): Payer: Medicare Other

## 2014-10-03 DIAGNOSIS — E039 Hypothyroidism, unspecified: Secondary | ICD-10-CM | POA: Diagnosis not present

## 2014-10-03 DIAGNOSIS — E1140 Type 2 diabetes mellitus with diabetic neuropathy, unspecified: Secondary | ICD-10-CM | POA: Diagnosis not present

## 2014-10-03 DIAGNOSIS — E1165 Type 2 diabetes mellitus with hyperglycemia: Secondary | ICD-10-CM | POA: Diagnosis not present

## 2014-10-03 DIAGNOSIS — E785 Hyperlipidemia, unspecified: Secondary | ICD-10-CM | POA: Diagnosis not present

## 2014-10-03 DIAGNOSIS — E1122 Type 2 diabetes mellitus with diabetic chronic kidney disease: Secondary | ICD-10-CM

## 2014-10-03 DIAGNOSIS — N2581 Secondary hyperparathyroidism of renal origin: Secondary | ICD-10-CM | POA: Diagnosis not present

## 2014-10-03 DIAGNOSIS — N183 Chronic kidney disease, stage 3 unspecified: Secondary | ICD-10-CM

## 2014-10-03 DIAGNOSIS — E1322 Other specified diabetes mellitus with diabetic chronic kidney disease: Secondary | ICD-10-CM | POA: Diagnosis not present

## 2014-10-03 DIAGNOSIS — IMO0002 Reserved for concepts with insufficient information to code with codable children: Secondary | ICD-10-CM

## 2014-10-03 LAB — RENAL FUNCTION PANEL
ALBUMIN: 3.9 g/dL (ref 3.5–5.2)
BUN: 18 mg/dL (ref 6–23)
CALCIUM: 9.4 mg/dL (ref 8.4–10.5)
CO2: 27 meq/L (ref 19–32)
Chloride: 104 mEq/L (ref 96–112)
Creatinine, Ser: 1.23 mg/dL — ABNORMAL HIGH (ref 0.40–1.20)
GFR: 45.67 mL/min — ABNORMAL LOW (ref >60.00)
Glucose, Bld: 229 mg/dL — ABNORMAL HIGH (ref 70–99)
POTASSIUM: 4.2 meq/L (ref 3.5–5.1)
Phosphorus: 3.7 mg/dL (ref 2.3–4.6)
Sodium: 134 mEq/L — ABNORMAL LOW (ref 135–145)

## 2014-10-03 LAB — TSH: TSH: 5.17 u[IU]/mL — ABNORMAL HIGH (ref 0.35–4.50)

## 2014-10-03 LAB — LIPID PANEL
CHOL/HDL RATIO: 4
Cholesterol: 129 mg/dL (ref 0–200)
HDL: 36.2 mg/dL — ABNORMAL LOW (ref >39.00)
LDL Cholesterol: 70 mg/dL (ref 0–99)
NonHDL: 92.8
Triglycerides: 114 mg/dL (ref 0.0–149.0)
VLDL: 22.8 mg/dL (ref 0.0–40.0)

## 2014-10-03 LAB — VITAMIN D 25 HYDROXY (VIT D DEFICIENCY, FRACTURES): VITD: 37.82 ng/mL (ref 30.00–100.00)

## 2014-10-03 LAB — HEMOGLOBIN A1C: Hgb A1c MFr Bld: 9.4 % — ABNORMAL HIGH (ref 4.6–6.5)

## 2014-10-04 LAB — PARATHYROID HORMONE, INTACT (NO CA): PTH: 56 pg/mL (ref 14–64)

## 2014-10-09 DIAGNOSIS — L218 Other seborrheic dermatitis: Secondary | ICD-10-CM | POA: Diagnosis not present

## 2014-10-09 DIAGNOSIS — Z1283 Encounter for screening for malignant neoplasm of skin: Secondary | ICD-10-CM | POA: Diagnosis not present

## 2014-10-09 DIAGNOSIS — L570 Actinic keratosis: Secondary | ICD-10-CM | POA: Diagnosis not present

## 2014-10-09 DIAGNOSIS — Z872 Personal history of diseases of the skin and subcutaneous tissue: Secondary | ICD-10-CM | POA: Diagnosis not present

## 2014-10-10 ENCOUNTER — Encounter: Payer: Self-pay | Admitting: Family Medicine

## 2014-10-10 ENCOUNTER — Ambulatory Visit (INDEPENDENT_AMBULATORY_CARE_PROVIDER_SITE_OTHER)
Admission: RE | Admit: 2014-10-10 | Discharge: 2014-10-10 | Disposition: A | Discharge status: Home / Self Care | Payer: Medicare Other | Source: Ambulatory Visit | Attending: Family Medicine | Admitting: Family Medicine

## 2014-10-10 ENCOUNTER — Ambulatory Visit (INDEPENDENT_AMBULATORY_CARE_PROVIDER_SITE_OTHER): Payer: Medicare Other | Admitting: Family Medicine

## 2014-10-10 VITALS — BP 122/64 | HR 70 | Temp 98.6°F | Resp 16 | Ht 66.0 in | Wt 197.8 lb

## 2014-10-10 DIAGNOSIS — E1165 Type 2 diabetes mellitus with hyperglycemia: Secondary | ICD-10-CM

## 2014-10-10 DIAGNOSIS — IMO0002 Reserved for concepts with insufficient information to code with codable children: Secondary | ICD-10-CM

## 2014-10-10 DIAGNOSIS — R053 Chronic cough: Secondary | ICD-10-CM

## 2014-10-10 DIAGNOSIS — E1140 Type 2 diabetes mellitus with diabetic neuropathy, unspecified: Secondary | ICD-10-CM

## 2014-10-10 DIAGNOSIS — E039 Hypothyroidism, unspecified: Secondary | ICD-10-CM

## 2014-10-10 DIAGNOSIS — I10 Essential (primary) hypertension: Secondary | ICD-10-CM | POA: Diagnosis not present

## 2014-10-10 DIAGNOSIS — N183 Chronic kidney disease, stage 3 unspecified: Secondary | ICD-10-CM

## 2014-10-10 DIAGNOSIS — R05 Cough: Secondary | ICD-10-CM | POA: Diagnosis not present

## 2014-10-10 DIAGNOSIS — E1322 Other specified diabetes mellitus with diabetic chronic kidney disease: Secondary | ICD-10-CM

## 2014-10-10 DIAGNOSIS — E1122 Type 2 diabetes mellitus with diabetic chronic kidney disease: Secondary | ICD-10-CM

## 2014-10-10 DIAGNOSIS — E785 Hyperlipidemia, unspecified: Secondary | ICD-10-CM

## 2014-10-10 DIAGNOSIS — N2581 Secondary hyperparathyroidism of renal origin: Secondary | ICD-10-CM

## 2014-10-10 DIAGNOSIS — M2578 Osteophyte, vertebrae: Secondary | ICD-10-CM | POA: Diagnosis not present

## 2014-10-10 DIAGNOSIS — I2510 Atherosclerotic heart disease of native coronary artery without angina pectoris: Secondary | ICD-10-CM

## 2014-10-10 DIAGNOSIS — Z0000 Encounter for general adult medical examination without abnormal findings: Secondary | ICD-10-CM | POA: Diagnosis not present

## 2014-10-10 MED ORDER — LEVOTHYROXINE SODIUM 25 MCG PO TABS: ORAL_TABLET | ORAL | Status: DC

## 2014-10-10 NOTE — Progress Notes (Signed)
Pre visit review using our clinic review tool, if applicable. No additional management support is needed unless otherwise documented below in the visit note. 

## 2014-10-10 NOTE — Assessment & Plan Note (Signed)
rec f/u with endo for further DM management.

## 2014-10-10 NOTE — Assessment & Plan Note (Signed)
Continue atorvastatin. LDL 70. Discussed night time dosing.

## 2014-10-10 NOTE — Progress Notes (Signed)
BP 122/64 mmHg  Pulse 70  Temp(Src) 98.6 F (37 C) (Oral)  Resp 16  Ht 5\' 6"  (1.676 m)  Wt 197 lb 12.8 oz (89.721 kg)  BMI 31.94 kg/m2  SpO2 96%   CC: CPE  Subjective:    Patient ID: Julia Banks, female    DOB: 12/08/1940, 74 y.o.   MRN: [id number]  HPI: Julia Banks is a 74 y.o. female presenting on 10/10/2014 for CPE   Staying fatigued. Wonders if thyroid related. Chronic cough - present for years worse in am and at night time. Overall dry, occasionally productive. Endorses h/o abnormal chest xray. Ongoing R thoracic back pain as well for years. No dyspnea, fevers/chills.   Failed hearing screen today. Denies trouble with hearing. Passes vision screen No falls in last year. Denies depression/anhedonia, sadness. Lost husband 54.  Preventative: Colonoscopy 05/2010 in [place] (Dr. Idolina Primer?) - polyps found. rec return in 2 years. Requests referral back to colonoscopy.No records of this. Overdue for this - life events got in the way (husband passed away then pt dx with breast cancer).  Well woman - s/p hysterectomy for benign reason.Last well woman exam with Dr. Helane Rima 03/2014. Has f/u appt this fall. Breast cancer - s/p treatment. Sees Dr Sonny Dandy in [place] - through Dr Christen Butter office. ?osteopenia Flu shot - declines Tetanus - unsure prevnar 2006, pneumovax 2014 Shingles shot - not currently interested  Advanced directives: has this at home. HC proxy would be niece Laverna Peace  Widow, husband Fritz Pickerel passed 2015. Has 3 dogs No children 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, nutritional Edu: 1 yr college Activity: stationary bicycle, some walking Diet: good water, fruits/vegetables daily  Relevant past medical, surgical, family and social history reviewed and updated as indicated. Interim medical history since our last visit reviewed. Allergies and medications reviewed and updated. Current Outpatient Prescriptions on  File Prior to Visit  Medication Sig  . ACCU-CHEK AVIVA PLUS test strip USE 4/DAY TO CHECK BLOOD SUGARS.  Marland Kitchen amLODipine (NORVASC) 10 MG tablet TAKE 1 TABLET BY MOUTH EVERY DAY FOR BLOOD PRESSURE  . anastrozole (ARIMIDEX) 1 MG tablet Take 1 tablet (1 mg total) by mouth daily.  Marland Kitchen atorvastatin (LIPITOR) 40 MG tablet TAKE 1 TABLET (40 MG TOTAL) BY MOUTH DAILY.  Marland Kitchen B-D UF III MINI PEN NEEDLES 31G X 5 MM MISC USE 4 TIMES DAILY AS DIRECTED  . doxycycline (VIBRAMYCIN) 100 MG capsule Take 100 mg by mouth daily as needed (FOR FACE).   Marland Kitchen FINACEA 15 % cream Apply 1 application topically at bedtime.   . furosemide (LASIX) 20 MG tablet TAKE 1/2 TABLET (10 MG TOTAL) BY MOUTH DAILY for blood pressure  . ibuprofen (ADVIL,MOTRIN) 200 MG tablet Take 200 mg by mouth every 6 (six) hours as needed for headache or mild pain.  Marland Kitchen insulin aspart protamine- aspart (NOVOLOG MIX 70/30) (70-30) 100 UNIT/ML injection Inject 67 units with breakfast  and 42 units at evening meal  . lactulose (CHRONULAC) 10 GM/15ML solution   . losartan (COZAAR) 25 MG tablet Take 25 mg by mouth daily.  . metoprolol succinate (TOPROL-XL) 100 MG 24 hr tablet TAKE 1 TABLET BY MOUTH DAILY. TAKE WITH OR IMMEDIATELY FOLLOWING A MEAL.  Marland Kitchen omeprazole (PRILOSEC) 40 MG capsule TAKE 1 CAPSULE (40 MG TOTAL) BY MOUTH DAILY AS NEEDED.  Marland Kitchen OVACE PLUS 10 % CREA Apply 1 application topically daily as needed (to face).   . potassium chloride SA (KLOR-CON M20) 20 MEQ tablet  TAKE 1 TABLET (20 MEQ TOTAL) BY MOUTH DAILY.  . fluconazole (DIFLUCAN) 150 MG tablet Take 150 mg by mouth daily as needed (TAKE AS NEEDED WHEN TAKING AN ANTIBIOTIC).    No current facility-administered medications on file prior to visit.    Review of Systems Per HPI unless specifically indicated above     Objective:    BP 122/64 mmHg  Pulse 70  Temp(Src) 98.6 F (37 C) (Oral)  Resp 16  Ht 5\' 6"  (1.676 m)  Wt 197 lb 12.8 oz (89.721 kg)  BMI 31.94 kg/m2  SpO2 96%  Wt Readings from  Last 3 Encounters:  10/10/14 197 lb 12.8 oz (89.721 kg)  09/19/14 198 lb 2 oz (89.869 kg)  06/20/14 197 lb 4.8 oz (89.495 kg)    Physical Exam  Constitutional: She is oriented to person, place, and time. She appears well-developed and well-nourished. No distress.  HENT:  Head: Normocephalic and atraumatic.  Right Ear: Hearing, tympanic membrane, external ear and ear canal normal.  Left Ear: Hearing, tympanic membrane, external ear and ear canal normal.  Nose: Nose normal.  Mouth/Throat: Uvula is midline, oropharynx is clear and moist and mucous membranes are normal. No oropharyngeal exudate, posterior oropharyngeal edema or posterior oropharyngeal erythema.  Eyes: Conjunctivae and EOM are normal. Pupils are equal, round, and reactive to light. No scleral icterus.  Neck: Normal range of motion. Neck supple. Carotid bruit is not present. No thyromegaly present.  Cardiovascular: Normal rate, regular rhythm, normal heart sounds and intact distal pulses.   No murmur heard. Pulses:      Radial pulses are 2+ on the right side, and 2+ on the left side.  Pulmonary/Chest: Effort normal and breath sounds normal. No respiratory distress. She has no wheezes. She has no rales.  Breast - deferred  Lungs clear today  Abdominal: Soft. Bowel sounds are normal. She exhibits no distension and no mass. There is no tenderness. There is no rebound and no guarding.  Genitourinary:  GYN - deferred per GYN  Musculoskeletal: Normal range of motion. She exhibits no edema.  Lymphadenopathy:    She has no cervical adenopathy.  Neurological: She is alert and oriented to person, place, and time.  CN grossly intact, station and gait intact Recall 3/3 Calculation D-L-R-O-W 5/5  Skin: Skin is warm and dry. No rash noted.  Psychiatric: She has a normal mood and affect. Her behavior is normal. Judgment and thought content normal.  Nursing note and vitals reviewed.  Results for orders placed or performed in visit on  10/03/14  Renal function panel  Result Value Ref Range   Sodium 134 (L) 135 - 145 mEq/L   Potassium 4.2 3.5 - 5.1 mEq/L   Chloride 104 96 - 112 mEq/L   CO2 27 19 - 32 mEq/L   Calcium 9.4 8.4 - 10.5 mg/dL   Albumin 3.9 3.5 - 5.2 g/dL   BUN 18 6 - 23 mg/dL   Creatinine, Ser 1.23 (H) 0.40 - 1.20 mg/dL   Glucose, Bld 229 (H) 70 - 99 mg/dL   Phosphorus 3.7 2.3 - 4.6 mg/dL   GFR 45.67 (L) >60.00 mL/min  Lipid panel  Result Value Ref Range   Cholesterol 129 0 - 200 mg/dL   Triglycerides 114.0 0.0 - 149.0 mg/dL   HDL 36.20 (L) >39.00 mg/dL   VLDL 22.8 0.0 - 40.0 mg/dL   LDL Cholesterol 70 0 - 99 mg/dL   Total CHOL/HDL Ratio 4    NonHDL 92.80   Hemoglobin  A1c  Result Value Ref Range   Hgb A1c MFr Bld 9.4 (H) 4.6 - 6.5 %  TSH  Result Value Ref Range   TSH 5.17 (H) 0.35 - 4.50 uIU/mL  Parathyroid hormone, intact (no Ca)  Result Value Ref Range   PTH 56 14 - 64 pg/mL  Vit D  25 hydroxy (rtn osteoporosis monitoring)  Result Value Ref Range   VITD 37.82 30.00 - 100.00 ng/mL      Assessment & Plan:   Problem List Items Addressed This Visit    Type 2 diabetes, uncontrolled, with neuropathy    rec f/u with endo for further DM management.      Secondary hyperparathyroidism of renal origin    PTH stable.      Medicare annual wellness visit, subsequent - Primary    I have personally reviewed the Medicare Annual Wellness questionnaire and have noted 1. The patient's medical and social history 2. Their use of alcohol, tobacco or illicit drugs 3. Their current medications and supplements 4. The patient's functional ability including ADL's, fall risks, home safety risks and hearing or visual impairment. 5. Diet and physical activity 6. Evidence for depression or mood disorders The patients weight, height, BMI have been recorded in the chart.  Hearing and vision has been addressed. I have made referrals, counseling and provided education to the patient based review of the above and I  have provided the pt with a written personalized care plan for preventive services. Provider list updated - see scanned questionairre. Reviewed preventative protocols and updated unless pt declined.       Hypothyroidism    TSH mildly elevated with fatigue endorsed - increase levothyroxine to 55mcg daily with 68mcg on MWF. Recheck levels in 2 mo      Relevant Medications   levothyroxine (SYNTHROID, LEVOTHROID) tablet   HLD (hyperlipidemia)    Continue atorvastatin. LDL 70. Discussed night time dosing.      Essential hypertension, benign    Chronic, stable. Continue regimen.      Diabetes mellitus with stage 3 chronic kidney disease    Discussed CKD with patient. PTH normal today.      Coronary artery disease    asxs.      Chronic cough    Ongoing cough present for several months. Lungs clear today. H/o abnormal CXR (RML atx vs pleural thickening) 05/2014 but resolved 06/2014. Given persistent cough will re eval today with CXR (in breast cancer history). Pt agrees wit hplan.      Relevant Orders   DG Chest 2 View       Follow up plan: Return in about 6 months (around 04/11/2015), or as needed, for follow up visit.

## 2014-10-10 NOTE — Assessment & Plan Note (Signed)
Discussed CKD with patient. PTH normal today.

## 2014-10-10 NOTE — Assessment & Plan Note (Signed)
PTH stable.

## 2014-10-10 NOTE — Assessment & Plan Note (Signed)

## 2014-10-10 NOTE — Assessment & Plan Note (Signed)
Ongoing cough present for several months. Lungs clear today. H/o abnormal CXR (RML atx vs pleural thickening) 05/2014 but resolved 06/2014. Given persistent cough will re eval today with CXR (in breast cancer history). Pt agrees wit hplan.

## 2014-10-10 NOTE — Assessment & Plan Note (Signed)
Chronic, stable. Continue regimen. 

## 2014-10-10 NOTE — Assessment & Plan Note (Signed)
asxs. 

## 2014-10-10 NOTE — Patient Instructions (Addendum)
We are due for colonoscopy. Call us with name of doctor you'd like referred to.  Bring [place] a copy of your living will to update your chart. Schedule appointment with Dr Loanne Drilling as you're due Increase levothyroxine to 2 tablets on Mon Wed Friday, 1 tablet every other day. Recheck levels in 2 months. Chest xray today for chronic cough. Pass by up front to sign release of records for colonoscopy and DEXA scan

## 2014-10-10 NOTE — Assessment & Plan Note (Signed)
TSH mildly elevated with fatigue endorsed - increase levothyroxine to 42mcg daily with 19mcg on MWF. Recheck levels in 2 mo

## 2014-10-26 ENCOUNTER — Encounter: Payer: Self-pay | Admitting: Internal Medicine

## 2014-11-04 ENCOUNTER — Encounter: Payer: Self-pay | Admitting: Family Medicine

## 2014-11-23 ENCOUNTER — Other Ambulatory Visit: Payer: Self-pay | Admitting: Family Medicine

## 2014-11-25 ENCOUNTER — Encounter: Payer: Self-pay | Admitting: Family Medicine

## 2014-11-25 DIAGNOSIS — E1140 Type 2 diabetes mellitus with diabetic neuropathy, unspecified: Secondary | ICD-10-CM

## 2014-11-25 DIAGNOSIS — N2581 Secondary hyperparathyroidism of renal origin: Secondary | ICD-10-CM

## 2014-11-25 DIAGNOSIS — E039 Hypothyroidism, unspecified: Secondary | ICD-10-CM

## 2014-11-25 DIAGNOSIS — E1165 Type 2 diabetes mellitus with hyperglycemia: Principal | ICD-10-CM

## 2014-11-25 DIAGNOSIS — IMO0002 Reserved for concepts with insufficient information to code with codable children: Secondary | ICD-10-CM

## 2014-12-04 ENCOUNTER — Telehealth: Payer: Self-pay | Admitting: Hematology and Oncology

## 2014-12-04 NOTE — Telephone Encounter (Signed)
Patient had called and left a message to reschedule her appointment to the following week,called and left a message with the new appointment

## 2014-12-09 ENCOUNTER — Encounter: Payer: Self-pay | Admitting: Family Medicine

## 2014-12-09 DIAGNOSIS — E039 Hypothyroidism, unspecified: Secondary | ICD-10-CM

## 2014-12-11 NOTE — Telephone Encounter (Signed)
Lab appt scheduled.

## 2014-12-11 NOTE — Telephone Encounter (Signed)
plz call to schedule lab visit to check thyroid levels.

## 2014-12-18 DIAGNOSIS — B351 Tinea unguium: Secondary | ICD-10-CM | POA: Diagnosis not present

## 2014-12-18 DIAGNOSIS — E1142 Type 2 diabetes mellitus with diabetic polyneuropathy: Secondary | ICD-10-CM | POA: Diagnosis not present

## 2014-12-19 ENCOUNTER — Other Ambulatory Visit (INDEPENDENT_AMBULATORY_CARE_PROVIDER_SITE_OTHER): Payer: Medicare Other

## 2014-12-19 DIAGNOSIS — E039 Hypothyroidism, unspecified: Secondary | ICD-10-CM | POA: Diagnosis not present

## 2014-12-19 LAB — T4, FREE: Free T4: 0.95 ng/dL (ref 0.60–1.60)

## 2014-12-19 LAB — TSH: TSH: 1.4 u[IU]/mL (ref 0.35–4.50)

## 2014-12-25 DIAGNOSIS — M25561 Pain in right knee: Secondary | ICD-10-CM | POA: Diagnosis not present

## 2014-12-29 ENCOUNTER — Telehealth: Payer: Self-pay | Admitting: Internal Medicine

## 2014-12-29 DIAGNOSIS — M25561 Pain in right knee: Secondary | ICD-10-CM | POA: Diagnosis not present

## 2014-12-29 HISTORY — PX: COLONOSCOPY: SHX174

## 2015-01-04 ENCOUNTER — Ambulatory Visit (AMBULATORY_SURGERY_CENTER): Payer: Self-pay

## 2015-01-04 VITALS — Ht 66.0 in | Wt 201.0 lb

## 2015-01-04 DIAGNOSIS — Z860100 Personal history of colon polyps, unspecified: Secondary | ICD-10-CM

## 2015-01-04 DIAGNOSIS — M1712 Unilateral primary osteoarthritis, left knee: Secondary | ICD-10-CM | POA: Diagnosis not present

## 2015-01-04 HISTORY — PX: OTHER SURGICAL HISTORY: SHX169

## 2015-01-04 MED ORDER — SUPREP BOWEL PREP KIT 17.5-3.13-1.6 GM/177ML PO SOLN: 1.0000 | Freq: Once | ORAL | Status: DC

## 2015-01-04 NOTE — Progress Notes (Signed)
No allergies to eggs or soy No diet/weight loss meds No home oxygen No past problem with anesthesia except post-op pneumonia once  Does not want emmi instructions

## 2015-01-18 ENCOUNTER — Encounter: Payer: Self-pay | Admitting: Internal Medicine

## 2015-01-18 ENCOUNTER — Ambulatory Visit (AMBULATORY_SURGERY_CENTER): Payer: Medicare Other | Admitting: Internal Medicine

## 2015-01-18 VITALS — BP 142/64 | HR 72 | Temp 97.7°F | Resp 23 | Ht 66.0 in | Wt 201.0 lb

## 2015-01-18 DIAGNOSIS — D124 Benign neoplasm of descending colon: Secondary | ICD-10-CM

## 2015-01-18 DIAGNOSIS — G4733 Obstructive sleep apnea (adult) (pediatric): Secondary | ICD-10-CM | POA: Diagnosis not present

## 2015-01-18 DIAGNOSIS — I2510 Atherosclerotic heart disease of native coronary artery without angina pectoris: Secondary | ICD-10-CM | POA: Diagnosis not present

## 2015-01-18 DIAGNOSIS — E039 Hypothyroidism, unspecified: Secondary | ICD-10-CM | POA: Diagnosis not present

## 2015-01-18 DIAGNOSIS — J45909 Unspecified asthma, uncomplicated: Secondary | ICD-10-CM | POA: Diagnosis not present

## 2015-01-18 DIAGNOSIS — Z86010 Personal history of colonic polyps: Secondary | ICD-10-CM

## 2015-01-18 DIAGNOSIS — I10 Essential (primary) hypertension: Secondary | ICD-10-CM | POA: Diagnosis not present

## 2015-01-18 DIAGNOSIS — K219 Gastro-esophageal reflux disease without esophagitis: Secondary | ICD-10-CM | POA: Diagnosis not present

## 2015-01-18 DIAGNOSIS — E119 Type 2 diabetes mellitus without complications: Secondary | ICD-10-CM | POA: Diagnosis not present

## 2015-01-18 LAB — GLUCOSE, CAPILLARY
Glucose-Capillary: 214 mg/dL — ABNORMAL HIGH (ref 65–99)
Glucose-Capillary: 221 mg/dL — ABNORMAL HIGH (ref 65–99)

## 2015-01-18 MED ORDER — SODIUM CHLORIDE 0.9 % IV SOLN: 500.0000 mL | INTRAVENOUS | Status: DC

## 2015-01-18 NOTE — Progress Notes (Signed)
Called to room to assist during endoscopic procedure.  Patient ID and intended procedure confirmed with present staff. Received instructions for my participation in the procedure from the performing physician.  

## 2015-01-18 NOTE — Patient Instructions (Signed)
YOU HAD AN ENDOSCOPIC PROCEDURE TODAY AT THE [facility]:   Refer to the procedure report that was given to you for any specific questions about what was found during the examination.  If the procedure report does not answer your questions, please call your gastroenterologist to clarify.  If you requested that your care partner not be given the details of your procedure findings, then the procedure report has been included in a sealed envelope for you to review at your convenience later.  YOU SHOULD EXPECT: Some feelings of bloating in the abdomen. Passage of more gas than usual.  Walking can help get rid of the air that was put into your GI tract during the procedure and reduce the bloating. If you had a lower endoscopy (such as a colonoscopy or flexible sigmoidoscopy) you may notice spotting of blood in your stool or on the toilet paper. If you underwent a bowel prep for your procedure, you may not have a normal bowel movement for a few days.  Please Note:  You might notice some irritation and congestion in your nose or some drainage.  This is from the oxygen used during your procedure.  There is no need for concern and it should clear up in a day or so.  SYMPTOMS TO REPORT IMMEDIATELY:   Following lower endoscopy (colonoscopy or flexible sigmoidoscopy):  Excessive amounts of blood in the stool  Significant tenderness or worsening of abdominal pains  Swelling of the abdomen that is new, acute  Fever of 100F or higher    For urgent or emergent issues, a gastroenterologist can be reached at any hour by calling [phone].   DIET: Your first meal following the procedure should be a small meal and then it is ok to progress to your normal diet. Heavy or fried foods are harder to digest and may make you feel nauseous or bloated.  Likewise, meals heavy in dairy and vegetables can increase bloating.  Drink plenty of fluids but you should avoid alcoholic beverages for 24  hours.  ACTIVITY:  You should plan to take it easy for the rest of today and you should NOT DRIVE or use heavy machinery until tomorrow (because of the sedation medicines used during the test).    FOLLOW UP: Our staff will call the number listed on your records the next business day following your procedure to check on you and address any questions or concerns that you may have regarding the information given to you following your procedure. If we do not reach you, we will leave a message.  However, if you are feeling well and you are not experiencing any problems, there is no need to return our call.  We will assume that you have returned to your regular daily activities without incident.  If any biopsies were taken you will be contacted by phone or by letter within the next 1-3 weeks.  Please call us at [phone] if you have not heard about the biopsies in 3 weeks.    SIGNATURES/CONFIDENTIALITY: You and/or your care partner have signed paperwork which will be entered into your electronic medical record.  These signatures attest to the fact that that the information above on your After Visit Summary has been reviewed and is understood.  Full responsibility of the confidentiality of this discharge information lies with you and/or your care-partner.   Information on polyps given to you today 

## 2015-01-18 NOTE — Op Note (Signed)
[facility]  [address] [place], [id number]   COLONOSCOPY PROCEDURE REPORT  PATIENT: Julia Banks, Julia Banks  MR#: [id number] BIRTHDATE: Jan 25, 1941 , 66  yrs. old GENDER: female ENDOSCOPIST: Eustace Quail, MD REFERRED TA:[id number] Gutierrez, M.D. PROCEDURE DATE:  01/18/2015 PROCEDURE:   Colonoscopy, surveillance and Colonoscopy with snare polypectomy X1 First Screening Colonoscopy - Avg.  risk and is 50 yrs.  old or older - No.  Prior Negative Screening - Now for repeat screening. N/A  History of Adenoma - Now for follow-up colonoscopy & has been > or = to 3 yrs.  Yes hx of adenoma.  Has been 3 or more years since last colonoscopy.  Polyps removed today? Yes ASA CLASS:   Class II INDICATIONS:Surveillance due to prior colonic neoplasia and PH Colon Adenoma. . Prior colonoscopies in [place]. Last exam December 2011 with small tubular adenomas MEDICATIONS: Monitored anesthesia care, Propofol 200 mg IV, and Lidocaine 150 mg IV  DESCRIPTION OF PROCEDURE:   After the risks benefits and alternatives of the procedure were thoroughly explained, informed consent was obtained.  The digital rectal exam revealed no abnormalities of the rectum.   The [id number]  endoscope was introduced through the anus and advanced to the cecum, which was identified by both the appendix and ileocecal valve. No adverse events experienced.   The quality of the prep was excellent. (Suprep was used)  The instrument was then slowly withdrawn as the colon was fully examined. Estimated blood loss is zero unless otherwise noted in this procedure report.     COLON FINDINGS: A single polyp measuring 2 mm in size was found in the descending colon.  A polypectomy was performed with a cold snare.  The resection was complete, the polyp tissue was completely retrieved and sent to histology.   The examination was otherwise normal.  Retroflexed views revealed internal hemorrhoids. The time to  cecum = 2.6 Withdrawal time = 14.0   The scope was withdrawn and the procedure completed. COMPLICATIONS: There were no immediate complications.  ENDOSCOPIC IMPRESSION: 1.   Single polyp was found in the descending colon; polypectomy was performed with a cold snare 2.   The examination was otherwise normal  RECOMMENDATIONS: 1. Follow up colonoscopy in 5 years  eSigned:  Eustace Quail, MD 01/18/2015 8:45 AM   cc: Ria Bush MD and The Patient

## 2015-01-18 NOTE — Progress Notes (Signed)
Report to PACU, RN, vss, BBS= Clear.  

## 2015-01-19 ENCOUNTER — Telehealth: Payer: Self-pay

## 2015-01-19 NOTE — Telephone Encounter (Signed)
  Follow up Call-  Call back number 01/18/2015  Post procedure Call Back phone  # [phone]  Permission to leave phone message Yes     Patient questions:  Do you have a fever, pain , or abdominal swelling? No. Pain Score  0 *  Have you tolerated food without any problems? Yes.    Have you been able to return to your normal activities? Yes.    Do you have any questions about your discharge instructions: Diet   No. Medications  No. Follow up visit  No.  Do you have questions or concerns about your Care? No.  Actions: * If pain score is 4 or above: No action needed, pain <4.

## 2015-01-23 ENCOUNTER — Encounter: Payer: Self-pay | Admitting: Internal Medicine

## 2015-01-27 ENCOUNTER — Encounter: Payer: Self-pay | Admitting: Family Medicine

## 2015-01-28 NOTE — Telephone Encounter (Signed)
updated

## 2015-02-12 ENCOUNTER — Encounter: Payer: Self-pay | Admitting: Family Medicine

## 2015-02-12 DIAGNOSIS — G4700 Insomnia, unspecified: Secondary | ICD-10-CM

## 2015-02-12 NOTE — Telephone Encounter (Signed)
Please see Mychart message.

## 2015-02-13 DIAGNOSIS — M25561 Pain in right knee: Secondary | ICD-10-CM | POA: Diagnosis not present

## 2015-02-13 DIAGNOSIS — G4700 Insomnia, unspecified: Secondary | ICD-10-CM | POA: Insufficient documentation

## 2015-02-13 DIAGNOSIS — M1712 Unilateral primary osteoarthritis, left knee: Secondary | ICD-10-CM | POA: Diagnosis not present

## 2015-02-13 MED ORDER — TRAZODONE HCL 50 MG PO TABS: 25.0000 mg | ORAL_TABLET | Freq: Every day | ORAL | Status: DC

## 2015-03-18 ENCOUNTER — Other Ambulatory Visit: Payer: Self-pay | Admitting: Family Medicine

## 2015-03-20 ENCOUNTER — Other Ambulatory Visit: Payer: Self-pay | Admitting: Obstetrics and Gynecology

## 2015-03-20 DIAGNOSIS — Z90710 Acquired absence of both cervix and uterus: Secondary | ICD-10-CM | POA: Diagnosis not present

## 2015-03-20 DIAGNOSIS — Z124 Encounter for screening for malignant neoplasm of cervix: Secondary | ICD-10-CM | POA: Diagnosis not present

## 2015-03-20 DIAGNOSIS — Z6832 Body mass index (BMI) 32.0-32.9, adult: Secondary | ICD-10-CM | POA: Diagnosis not present

## 2015-03-20 DIAGNOSIS — R5383 Other fatigue: Secondary | ICD-10-CM | POA: Diagnosis not present

## 2015-03-20 DIAGNOSIS — Z01419 Encounter for gynecological examination (general) (routine) without abnormal findings: Secondary | ICD-10-CM | POA: Diagnosis not present

## 2015-03-20 DIAGNOSIS — Z1272 Encounter for screening for malignant neoplasm of vagina: Secondary | ICD-10-CM | POA: Diagnosis not present

## 2015-03-21 LAB — CYTOLOGY - PAP

## 2015-03-27 ENCOUNTER — Ambulatory Visit: Payer: PRIVATE HEALTH INSURANCE | Admitting: Hematology and Oncology

## 2015-04-02 ENCOUNTER — Ambulatory Visit (HOSPITAL_BASED_OUTPATIENT_CLINIC_OR_DEPARTMENT_OTHER): Payer: PRIVATE HEALTH INSURANCE | Admitting: Hematology and Oncology

## 2015-04-02 ENCOUNTER — Encounter: Payer: Self-pay | Admitting: Hematology and Oncology

## 2015-04-02 ENCOUNTER — Ambulatory Visit (HOSPITAL_BASED_OUTPATIENT_CLINIC_OR_DEPARTMENT_OTHER): Payer: Medicare Other

## 2015-04-02 ENCOUNTER — Telehealth: Payer: Self-pay | Admitting: Hematology and Oncology

## 2015-04-02 DIAGNOSIS — R51 Headache: Secondary | ICD-10-CM | POA: Diagnosis not present

## 2015-04-02 DIAGNOSIS — D0512 Intraductal carcinoma in situ of left breast: Secondary | ICD-10-CM

## 2015-04-02 DIAGNOSIS — N951 Menopausal and female climacteric states: Secondary | ICD-10-CM | POA: Diagnosis not present

## 2015-04-02 DIAGNOSIS — R0789 Other chest pain: Secondary | ICD-10-CM

## 2015-04-02 DIAGNOSIS — C50412 Malignant neoplasm of upper-outer quadrant of left female breast: Secondary | ICD-10-CM

## 2015-04-02 LAB — COMPREHENSIVE METABOLIC PANEL (CC13)
ALBUMIN: 4 g/dL (ref 3.5–5.0)
ALK PHOS: 85 U/L (ref 40–150)
ALT: 17 U/L (ref 0–55)
ANION GAP: 5 meq/L (ref 3–11)
AST: 20 U/L (ref 5–34)
BILIRUBIN TOTAL: 0.67 mg/dL (ref 0.20–1.20)
BUN: 15.3 mg/dL (ref 7.0–26.0)
CO2: 27 mEq/L (ref 22–29)
Calcium: 9.5 mg/dL (ref 8.4–10.4)
Chloride: 107 mEq/L (ref 98–109)
Creatinine: 1.3 mg/dL — ABNORMAL HIGH (ref 0.6–1.1)
EGFR: 41 mL/min/{1.73_m2} — AB (ref >90)
Glucose: 96 mg/dl (ref 70–140)
Potassium: 4.3 mEq/L (ref 3.5–5.1)
Sodium: 140 mEq/L (ref 136–145)
TOTAL PROTEIN: 6.6 g/dL (ref 6.4–8.3)

## 2015-04-02 MED ORDER — EXEMESTANE 25 MG PO TABS: 25.0000 mg | ORAL_TABLET | Freq: Every day | ORAL | Status: DC

## 2015-04-02 NOTE — Telephone Encounter (Signed)
Per 10/3 pof patient sent back to lab today and given avs report and appointment for January 2017.

## 2015-04-02 NOTE — Assessment & Plan Note (Signed)
Left breast DCIS status post bilateral mastectomy with sentinel lymph node biopsy left axilla : grade 1, 3.8 cm, 4 lymph nodes negative, ER 93%, PR 0% Started anastrozole 1 mg daily 06/18/2014, plan is for 5 years  Anastrozole toxicities: 1. Hot flashes 2-3 times a week 2. Muscle stiffness and achiness 3. Intermittent headaches and epigastric chest discomfort  Bone density test revealed osteopenia according to her as done by her primary care physician. Her calcium intake has been increased.  Return to clinic in 6 months for follow-up.

## 2015-04-02 NOTE — Progress Notes (Signed)
Patient Care Team: Ria Bush, MD as PCP - General (Family Medicine) Fanny Skates, MD as Consulting Physician (General Surgery) Nicholas Lose, MD as Consulting Physician (Hematology and Oncology) Arloa Koh, MD as Consulting Physician (Radiation Oncology)  DIAGNOSIS: Breast cancer of upper-outer quadrant of left female breast [facility])   Staging form: Breast, AJCC 7th Edition     Clinical: Stage 0 (Tis (DCIS), N0, cM0) - Unsigned       Staging comments: Staged at breast conference 04/05/14.      Pathologic: Stage 0 (Tis (DCIS), N0, cM0) - Signed by Rulon Eisenmenger, MD on 06/13/2014       Staging comments: Staged from final excisional pathology by Dr. Donato Heinz    SUMMARY OF ONCOLOGIC HISTORY:   Breast cancer of upper-outer quadrant of left female breast ([place])   12/02/2013 Mammogram Benign right breast calcifications unchanged from before 4 month followup recommended   03/24/2014 Initial Diagnosis Breast cancer of upper-outer quadrant of left female breast: DCIS with papillary features, ER 93% PR 0%   04/04/2014 Breast MRI Left breast upper outer quadrant posterior third irregular enhancing mass 5.3 x 2.0 x 2.3 cm: No abnormalities in the right breast or lymph nodes   04/06/2014 Initial Biopsy Right breast biopsy: Benign   06/07/2014 Surgery Left breast mastectomy: DCIS, grade 1, 3.8 cm in size, 4 lymph nodes negative, ER 93%, PR 0%   06/13/2014 -  Anti-estrogen oral therapy Anastrozole 1 mg daily    CHIEF COMPLIANT: Follow-up on anastrozole  INTERVAL HISTORY: Julia Banks is a 74 year old with above-mentioned history of recent left mastectomy for DCIS and is currently on anastrozole. She is tolerating it moderately well. She does have muscle aches and pains but they have improved over time. Denies any severe hot flashes. Denies any lumps or nodules in the breasts.  REVIEW OF SYSTEMS:   Constitutional: Denies fevers, chills or abnormal weight loss Eyes: Denies blurriness of  vision Ears, nose, mouth, throat, and face: Denies mucositis or sore throat Respiratory: Denies cough, dyspnea or wheezes Cardiovascular: Denies palpitation, chest discomfort or lower extremity swelling Gastrointestinal:  Denies nausea, heartburn or change in bowel habits Skin: Denies abnormal skin rashes Lymphatics: Denies new lymphadenopathy or easy bruising Neurological:Denies numbness, tingling or new weaknesses Behavioral/Psych: Mood is stable, no new changes  All other systems were reviewed with the patient and are negative.  I have reviewed the past medical history, past surgical history, social history and family history with the patient and they are unchanged from previous note.  ALLERGIES:  is allergic to altace; aspirin; limonene; and metformin and related.  MEDICATIONS:  Current Outpatient Prescriptions  Medication Sig Dispense Refill  . albuterol (PROVENTIL HFA;VENTOLIN HFA) 108 (90 BASE) MCG/ACT inhaler Inhale into the lungs every 6 (six) hours as needed for wheezing or shortness of breath.    Marland Kitchen amLODipine (NORVASC) 10 MG tablet TAKE 1 TABLET BY MOUTH EVERY DAY FOR BLOOD PRESSURE 90 tablet 3  . anastrozole (ARIMIDEX) 1 MG tablet TAKE 1 TABLET (1 MG TOTAL) BY MOUTH DAILY.  3  . Ascorbic Acid (VITAMIN C WITH ROSE HIPS) 500 MG tablet Take 500 mg by mouth daily.    Marland Kitchen atorvastatin (LIPITOR) 40 MG tablet TAKE 1 TABLET (40 MG TOTAL) BY MOUTH DAILY. 90 tablet 3  . Cholecalciferol (VITAMIN D PO) Take 5,000 Units by mouth.    . docusate sodium (COLACE) 100 MG capsule Take 100 mg by mouth 2 (two) times daily.    Marland Kitchen doxycycline (VIBRAMYCIN) 100 MG capsule  Take 100 mg by mouth daily as needed (FOR FACE).     Marland Kitchen exemestane (AROMASIN) 25 MG tablet Take 1 tablet (25 mg total) by mouth daily after breakfast. 30 tablet 0  . fluconazole (DIFLUCAN) 150 MG tablet Take 150 mg by mouth daily as needed (TAKE AS NEEDED WHEN TAKING AN ANTIBIOTIC).   6  . Fluticasone-Salmeterol (ADVAIR) 250-50 MCG/DOSE  AEPB Inhale 1 puff into the lungs 2 (two) times daily.    . furosemide (LASIX) 20 MG tablet TAKE 1/2 TABLET (10 MG TOTAL) BY MOUTH DAILY FOR BLOOD PRESSURE 45 tablet 2  . ibuprofen (ADVIL,MOTRIN) 200 MG tablet Take 200 mg by mouth every 6 (six) hours as needed for headache or mild pain.    Marland Kitchen insulin aspart protamine- aspart (NOVOLOG MIX 70/30) (70-30) 100 UNIT/ML injection Inject 67 units with breakfast  and 42 units at evening meal 40 mL 0  . isosorbide mononitrate (IMDUR) 30 MG 24 hr tablet Take by mouth.    Marland Kitchen KLOR-CON M20 20 MEQ tablet TAKE 1 TABLET (20 MEQ TOTAL) BY MOUTH DAILY. 90 tablet 2  . levothyroxine (SYNTHROID, LEVOTHROID) 25 MCG tablet Take one tablet daily except two tablets on MWF 130 tablet 3  . losartan (COZAAR) 25 MG tablet Take 25 mg by mouth daily.    . meloxicam (MOBIC) 7.5 MG tablet Take 7.5 mg by mouth daily. with food  1  . metoprolol succinate (TOPROL-XL) 100 MG 24 hr tablet TAKE 1 TABLET BY MOUTH DAILY. TAKE WITH OR IMMEDIATELY FOLLOWING A MEAL. 90 tablet 3  . niacin 50 MG tablet Take 50 mg by mouth at bedtime. No flush    . Omega-3 Fatty Acids (OMEGA 3 PO) Take by mouth. 180 EPA/120DHA    . OVER THE COUNTER MEDICATION Calcium magnesium 1000mg  day    . OVER THE COUNTER MEDICATION Women's multivitamin with iron    . traMADol (ULTRAM) 50 MG tablet TAKE 1-2 TABLETS BY MOUTH TWICE A DAY AS NEEDED FOR PAIN  0  . traZODone (DESYREL) 50 MG tablet Take 0.5-1 tablets (25-50 mg total) by mouth at bedtime. 30 tablet 1  . vitamin B-12 (CYANOCOBALAMIN) 1000 MCG tablet Take 1,000 mcg by mouth daily.     No current facility-administered medications for this visit.    PHYSICAL EXAMINATION: ECOG PERFORMANCE STATUS: 0 - Asymptomatic  Filed Vitals:   04/02/15 1349  BP: 195/68  Pulse: 76  Temp: 97.6 F (36.4 C)  Resp: 18   Filed Weights   04/02/15 1349  Weight: 200 lb (90.719 kg)    GENERAL:alert, no distress and comfortable SKIN: skin color, texture, turgor are normal, no  rashes or significant lesions EYES: normal, Conjunctiva are pink and non-injected, sclera clear OROPHARYNX:no exudate, no erythema and lips, buccal mucosa, and tongue normal  NECK: supple, thyroid normal size, non-tender, without nodularity LYMPH:  no palpable lymphadenopathy in the cervical, axillary or inguinal LUNGS: clear to auscultation and percussion with normal breathing effort HEART: regular rate & rhythm and no murmurs and no lower extremity edema ABDOMEN:abdomen soft, non-tender and normal bowel sounds Musculoskeletal:no cyanosis of digits and no clubbing  NEURO: alert & oriented x 3 with fluent speech, no focal motor/sensory deficits  LABORATORY DATA:  I have reviewed the data as listed   Chemistry      Component Value Date/Time   NA 140 04/02/2015 1447   NA 134* 10/03/2014 1013   NA 139 11/01/2013 1644   K 4.3 04/02/2015 1447   K 4.2 10/03/2014 1013  K 3.9 03/22/2013   CL 104 10/03/2014 1013   CO2 27 04/02/2015 1447   CO2 27 10/03/2014 1013   BUN 15.3 04/02/2015 1447   BUN 18 10/03/2014 1013   BUN 11 11/01/2013 1644   CREATININE 1.3* 04/02/2015 1447   CREATININE 1.23* 10/03/2014 1013   CREATININE 1.35 03/22/2013      Component Value Date/Time   CALCIUM 9.5 04/02/2015 1447   CALCIUM 9.4 10/03/2014 1013   ALKPHOS 85 04/02/2015 1447   ALKPHOS 113 05/31/2014 1059   ALKPHOS 113 03/22/2013   AST 20 04/02/2015 1447   AST 16 05/31/2014 1059   AST 18 03/22/2013   ALT 17 04/02/2015 1447   ALT 14 05/31/2014 1059   BILITOT 0.67 04/02/2015 1447   BILITOT 0.6 05/31/2014 1059   BILITOT 0.9 03/22/2013       Lab Results  Component Value Date   WBC 9.6 05/31/2014   HGB 13.3 05/31/2014   HCT 39.5 05/31/2014   MCV 84.9 05/31/2014   PLT 199 05/31/2014   NEUTROABS 6.3 05/31/2014     ASSESSMENT & PLAN:  Breast cancer of upper-outer quadrant of left female breast Left breast DCIS status post bilateral mastectomy with sentinel lymph node biopsy left axilla : grade  1, 3.8 cm, 4 lymph nodes negative, ER 93%, PR 0% Started anastrozole 1 mg daily 06/18/2014, plan is for 5 years  Anastrozole toxicities: 1. Hot flashes 2-3 times a week 2. Muscle stiffness and achiness 3. Intermittent headaches and epigastric chest discomfort  Bone density test revealed osteopenia according to her as done by her primary care physician. Her calcium intake has been increased.  Return to clinic in 6 months for follow-up.    Orders Placed This Encounter  Procedures  . Comprehensive metabolic panel (Cmet) - [facility]    Standing Status: Future     Number of Occurrences: 1     Standing Expiration Date: 04/01/2016   The patient has a good understanding of the overall plan. she agrees with it. she will call with any problems that may develop before the next visit here.   Rulon Eisenmenger, MD

## 2015-04-04 ENCOUNTER — Other Ambulatory Visit: Payer: Self-pay

## 2015-04-04 ENCOUNTER — Encounter: Payer: Self-pay | Admitting: Hematology and Oncology

## 2015-04-04 DIAGNOSIS — C50412 Malignant neoplasm of upper-outer quadrant of left female breast: Secondary | ICD-10-CM

## 2015-04-04 DIAGNOSIS — Z6832 Body mass index (BMI) 32.0-32.9, adult: Secondary | ICD-10-CM | POA: Diagnosis not present

## 2015-04-04 DIAGNOSIS — E669 Obesity, unspecified: Secondary | ICD-10-CM | POA: Diagnosis not present

## 2015-04-04 DIAGNOSIS — E1165 Type 2 diabetes mellitus with hyperglycemia: Secondary | ICD-10-CM | POA: Diagnosis not present

## 2015-04-04 DIAGNOSIS — Z794 Long term (current) use of insulin: Secondary | ICD-10-CM | POA: Diagnosis not present

## 2015-04-04 DIAGNOSIS — N183 Chronic kidney disease, stage 3 (moderate): Secondary | ICD-10-CM | POA: Diagnosis not present

## 2015-04-04 NOTE — Progress Notes (Signed)
mammo order faxed to [facility].  Sent to scan.

## 2015-04-05 ENCOUNTER — Other Ambulatory Visit: Payer: Self-pay

## 2015-04-07 ENCOUNTER — Encounter: Payer: Self-pay | Admitting: Family Medicine

## 2015-04-13 ENCOUNTER — Ambulatory Visit: Payer: Medicare Other | Admitting: Family Medicine

## 2015-04-18 DIAGNOSIS — Z853 Personal history of malignant neoplasm of breast: Secondary | ICD-10-CM | POA: Diagnosis not present

## 2015-04-18 DIAGNOSIS — R920 Mammographic microcalcification found on diagnostic imaging of breast: Secondary | ICD-10-CM | POA: Diagnosis not present

## 2015-04-27 ENCOUNTER — Encounter: Payer: Self-pay | Admitting: *Deleted

## 2015-04-27 NOTE — Progress Notes (Signed)
Received mammo report from [facility], sent to scan. 

## 2015-05-03 ENCOUNTER — Other Ambulatory Visit: Payer: Self-pay | Admitting: Hematology and Oncology

## 2015-05-03 NOTE — Telephone Encounter (Signed)
Chart reviewed.

## 2015-06-03 ENCOUNTER — Other Ambulatory Visit: Payer: Self-pay | Admitting: Hematology and Oncology

## 2015-06-04 NOTE — Telephone Encounter (Signed)
Chart reviewed.

## 2015-06-07 ENCOUNTER — Telehealth: Payer: Self-pay | Admitting: *Deleted

## 2015-06-07 ENCOUNTER — Encounter: Payer: Self-pay | Admitting: Hematology and Oncology

## 2015-06-07 NOTE — Telephone Encounter (Signed)
Attempted to call niece Michaela Corner - number no longer in service.    LMOVM - Pt should only be taking arimidex/anastrazole.  Pt to call clinic with any questions.

## 2015-06-07 NOTE — Telephone Encounter (Signed)
TC from pt's niece, Laverna Peace, stating that unknowingly, pt has been taking both anastrozole and exemestane for the last 3 months. [name] noted it when she picked up her prescriptions.[name] believes pt is to be on only Anastrozole. Please verify as both are on pt's med list

## 2015-06-11 ENCOUNTER — Other Ambulatory Visit: Payer: Self-pay | Admitting: Family Medicine

## 2015-06-11 NOTE — Telephone Encounter (Signed)
Received refill request electronically Last refill 02/13/15 #30/1 Last office visit 10/10/14 Is it okay to refill?

## 2015-06-19 DIAGNOSIS — E1142 Type 2 diabetes mellitus with diabetic polyneuropathy: Secondary | ICD-10-CM | POA: Diagnosis not present

## 2015-06-19 DIAGNOSIS — B351 Tinea unguium: Secondary | ICD-10-CM | POA: Diagnosis not present

## 2015-06-19 DIAGNOSIS — L851 Acquired keratosis [keratoderma] palmaris et plantaris: Secondary | ICD-10-CM | POA: Diagnosis not present

## 2015-07-09 ENCOUNTER — Ambulatory Visit: Payer: Medicare Other | Admitting: Hematology and Oncology

## 2015-07-09 ENCOUNTER — Telehealth: Payer: Self-pay | Admitting: Hematology and Oncology

## 2015-07-09 NOTE — Assessment & Plan Note (Addendum)
Left breast DCIS status post bilateral mastectomy with sentinel lymph node biopsy left axilla : grade 1, 3.8 cm, 4 lymph nodes negative, ER 93%, PR 0% Started anastrozole 1 mg daily 06/18/2014, plan is for 5 years  Anastrozole toxicities: 1. Hot flashes 2-3 times a week 2. Muscle stiffness and achiness 3. Intermittent headaches and epigastric chest discomfort  Received a phone call from her knees suggesting that patient was taking both anastrozole and exemestane for the past 3 months. Patient should only be taking anastrozole.  Bone density test revealed osteopenia according to her as done by her primary care physician. Her calcium intake has been increased.  Return to clinic in 6 months for follow-up.

## 2015-07-09 NOTE — Telephone Encounter (Signed)
Called niece back to reschedule due to weeather

## 2015-07-16 DIAGNOSIS — N183 Chronic kidney disease, stage 3 (moderate): Secondary | ICD-10-CM | POA: Diagnosis not present

## 2015-07-16 DIAGNOSIS — Z794 Long term (current) use of insulin: Secondary | ICD-10-CM | POA: Diagnosis not present

## 2015-07-16 DIAGNOSIS — Z6830 Body mass index (BMI) 30.0-30.9, adult: Secondary | ICD-10-CM | POA: Diagnosis not present

## 2015-07-16 DIAGNOSIS — E669 Obesity, unspecified: Secondary | ICD-10-CM | POA: Diagnosis not present

## 2015-07-16 DIAGNOSIS — E039 Hypothyroidism, unspecified: Secondary | ICD-10-CM | POA: Diagnosis not present

## 2015-07-16 DIAGNOSIS — E1165 Type 2 diabetes mellitus with hyperglycemia: Secondary | ICD-10-CM | POA: Diagnosis not present

## 2015-07-16 NOTE — Assessment & Plan Note (Signed)
Left breast DCIS status post bilateral mastectomy with sentinel lymph node biopsy left axilla : grade 1, 3.8 cm, 4 lymph nodes negative, ER 93%, PR 0% Started anastrozole 1 mg daily 06/18/2014, plan is for 5 years  Anastrozole toxicities: 1. Hot flashes 2-3 times a week 2. Muscle stiffness and achiness 3. Intermittent headaches and epigastric chest discomfort  Bone density test revealed osteopenia according to her as done by her primary care physician. Her calcium intake has been increased.  Return to clinic in 6 months for follow-up. 

## 2015-07-17 ENCOUNTER — Telehealth: Payer: Self-pay | Admitting: Hematology and Oncology

## 2015-07-17 ENCOUNTER — Ambulatory Visit (HOSPITAL_BASED_OUTPATIENT_CLINIC_OR_DEPARTMENT_OTHER): Payer: Medicare Other | Admitting: Hematology and Oncology

## 2015-07-17 ENCOUNTER — Encounter: Payer: Self-pay | Admitting: Hematology and Oncology

## 2015-07-17 VITALS — BP 158/61 | HR 69 | Temp 97.9°F | Resp 18 | Wt 190.5 lb

## 2015-07-17 DIAGNOSIS — C50412 Malignant neoplasm of upper-outer quadrant of left female breast: Secondary | ICD-10-CM | POA: Diagnosis not present

## 2015-07-17 DIAGNOSIS — M8580 Other specified disorders of bone density and structure, unspecified site: Secondary | ICD-10-CM

## 2015-07-17 NOTE — Progress Notes (Signed)
Patient Care Team: Ria Bush, MD as PCP - General (Family Medicine) Fanny Skates, MD as Consulting Physician (General Surgery) Nicholas Lose, MD as Consulting Physician (Hematology and Oncology) Arloa Koh, MD as Consulting Physician (Radiation Oncology)  DIAGNOSIS: Breast cancer of upper-outer quadrant of left female breast [facility])   Staging form: Breast, AJCC 7th Edition     Clinical: Stage 0 (Tis (DCIS), N0, cM0) - Unsigned       Staging comments: Staged at breast conference 04/05/14.      Pathologic: Stage 0 (Tis (DCIS), N0, cM0) - Signed by Rulon Eisenmenger, MD on 06/13/2014       Staging comments: Staged from final excisional pathology by Dr. Donato Heinz  SUMMARY OF ONCOLOGIC HISTORY:   Breast cancer of upper-outer quadrant of left female breast ([place])   12/02/2013 Mammogram Benign right breast calcifications unchanged from before 4 month followup recommended   03/24/2014 Initial Diagnosis Breast cancer of upper-outer quadrant of left female breast: DCIS with papillary features, ER 93% PR 0%   04/04/2014 Breast MRI Left breast upper outer quadrant posterior third irregular enhancing mass 5.3 x 2.0 x 2.3 cm: No abnormalities in the right breast or lymph nodes   04/06/2014 Initial Biopsy Right breast biopsy: Benign   06/07/2014 Surgery Left breast mastectomy: DCIS, grade 1, 3.8 cm in size, 4 lymph nodes negative, ER 93%, PR 0%   06/13/2014 -  Anti-estrogen oral therapy Anastrozole 1 mg daily , switched to Aromasin August 2016 , patient took both anastrozole and Aromasin for 4 months , resumed Aromasin alone 07/17/2015    CHIEF COMPLIANT:  Follow-up on antiestrogen therapy  INTERVAL HISTORY: Julia Banks is a  75 year old with above-mentioned history of left breast DCIS we'll could not tolerate anastrozole because of chest discomfort, she was switched to Aromasin but instead of taking Aromasin along she was taking both Aromasin and anastrozole together for 4 months. Her daughter  realized this mistake and stopped both of these drugs. It appears that during that time she was extremely fatigued, hair loss, confusion. Since she stopped the medication, her symptoms have resolved.  REVIEW OF SYSTEMS:   Constitutional: Denies fevers, chills or abnormal weight loss Eyes: Denies blurriness of vision Ears, nose, mouth, throat, and face: Denies mucositis or sore throat Respiratory: Denies cough, dyspnea or wheezes Cardiovascular: Denies palpitation, chest discomfort Gastrointestinal:  Denies nausea, heartburn or change in bowel habits Skin: Denies abnormal skin rashes Lymphatics: Denies new lymphadenopathy or easy bruising Neurological:Denies numbness, tingling or new weaknesses Behavioral/Psych: Mood is stable, no new changes  Extremities: No lower extremity edema Breast:  denies any pain or lumps or nodules in either breasts All other systems were reviewed with the patient and are negative.  I have reviewed the past medical history, past surgical history, social history and family history with the patient and they are unchanged from previous note.  ALLERGIES:  is allergic to altace; aspirin; limonene; and metformin and related.  MEDICATIONS:  Current Outpatient Prescriptions  Medication Sig Dispense Refill  . albuterol (PROVENTIL HFA;VENTOLIN HFA) 108 (90 BASE) MCG/ACT inhaler Inhale into the lungs every 6 (six) hours as needed for wheezing or shortness of breath.    . Ascorbic Acid (VITAMIN C WITH ROSE HIPS) 500 MG tablet Take 500 mg by mouth daily.    . Cholecalciferol (VITAMIN D PO) Take 5,000 Units by mouth.    . docusate sodium (COLACE) 100 MG capsule Take 100 mg by mouth 2 (two) times daily.    Marland Kitchen doxycycline (  VIBRAMYCIN) 100 MG capsule Take 100 mg by mouth daily as needed (FOR FACE).     . fluconazole (DIFLUCAN) 150 MG tablet Take 150 mg by mouth daily as needed (TAKE AS NEEDED WHEN TAKING AN ANTIBIOTIC).   6  . Fluticasone-Salmeterol (ADVAIR) 250-50 MCG/DOSE AEPB  Inhale 1 puff into the lungs 2 (two) times daily.    . furosemide (LASIX) 20 MG tablet TAKE 1/2 TABLET (10 MG TOTAL) BY MOUTH DAILY FOR BLOOD PRESSURE 45 tablet 2  . insulin aspart protamine- aspart (NOVOLOG MIX 70/30) (70-30) 100 UNIT/ML injection Inject 67 units with breakfast  and 42 units at evening meal 40 mL 0  . KLOR-CON M20 20 MEQ tablet TAKE 1 TABLET (20 MEQ TOTAL) BY MOUTH DAILY. 90 tablet 2  . levothyroxine (SYNTHROID, LEVOTHROID) 25 MCG tablet Take one tablet daily except two tablets on MWF 130 tablet 3  . meloxicam (MOBIC) 7.5 MG tablet Take 7.5 mg by mouth daily. with food  1  . metoprolol (LOPRESSOR) 50 MG tablet Take 50 mg by mouth daily.    . niacin 50 MG tablet Take 50 mg by mouth at bedtime. No flush    . Omega-3 Fatty Acids (OMEGA 3 PO) Take by mouth. 180 EPA/120DHA    . OVER THE COUNTER MEDICATION Calcium magnesium 1000mg  day    . OVER THE COUNTER MEDICATION Women's multivitamin with iron    . traZODone (DESYREL) 50 MG tablet TAKE 0.5-1 TABLETS (25-50 MG TOTAL) BY MOUTH AT BEDTIME. 30 tablet 1  . vitamin B-12 (CYANOCOBALAMIN) 1000 MCG tablet Take 1,000 mcg by mouth daily.    Marland Kitchen exemestane (AROMASIN) 25 MG tablet TAKE 1 TABLET (25 MG TOTAL) BY MOUTH DAILY AFTER BREAKFAST. (Patient not taking: Reported on 07/17/2015) 30 tablet 3   No current facility-administered medications for this visit.    PHYSICAL EXAMINATION: ECOG PERFORMANCE STATUS: 1 - Symptomatic but completely ambulatory  Filed Vitals:   07/17/15 1421  BP: 158/61  Pulse: 69  Temp: 97.9 F (36.6 C)  Resp: 18   Filed Weights   07/17/15 1421  Weight: 190 lb 8 oz (86.41 kg)    GENERAL:alert, no distress and comfortable SKIN: skin color, texture, turgor are normal, no rashes or significant lesions EYES: normal, Conjunctiva are pink and non-injected, sclera clear OROPHARYNX:no exudate, no erythema and lips, buccal mucosa, and tongue normal  NECK: supple, thyroid normal size, non-tender, without  nodularity LYMPH:  no palpable lymphadenopathy in the cervical, axillary or inguinal LUNGS: clear to auscultation and percussion with normal breathing effort HEART: regular rate & rhythm and no murmurs and no lower extremity edema ABDOMEN:abdomen soft, non-tender and normal bowel sounds MUSCULOSKELETAL:no cyanosis of digits and no clubbing  NEURO: alert & oriented x 3 with fluent speech, no focal motor/sensory deficits EXTREMITIES: No lower extremity edema  LABORATORY DATA:  I have reviewed the data as listed   Chemistry      Component Value Date/Time   NA 140 04/02/2015 1447   NA 134* 10/03/2014 1013   NA 139 11/01/2013 1644   K 4.3 04/02/2015 1447   K 4.2 10/03/2014 1013   K 3.9 03/22/2013   CL 104 10/03/2014 1013   CO2 27 04/02/2015 1447   CO2 27 10/03/2014 1013   BUN 15.3 04/02/2015 1447   BUN 18 10/03/2014 1013   BUN 11 11/01/2013 1644   CREATININE 1.3* 04/02/2015 1447   CREATININE 1.23* 10/03/2014 1013   CREATININE 1.35 03/22/2013      Component Value Date/Time  CALCIUM 9.5 04/02/2015 1447   CALCIUM 9.4 10/03/2014 1013   ALKPHOS 85 04/02/2015 1447   ALKPHOS 113 05/31/2014 1059   ALKPHOS 113 03/22/2013   AST 20 04/02/2015 1447   AST 16 05/31/2014 1059   AST 18 03/22/2013   ALT 17 04/02/2015 1447   ALT 14 05/31/2014 1059   BILITOT 0.67 04/02/2015 1447   BILITOT 0.6 05/31/2014 1059   BILITOT 0.9 03/22/2013       Lab Results  Component Value Date   WBC 9.6 05/31/2014   HGB 13.3 05/31/2014   HCT 39.5 05/31/2014   MCV 84.9 05/31/2014   PLT 199 05/31/2014   NEUTROABS 6.3 05/31/2014     ASSESSMENT & PLAN:  Breast cancer of upper-outer quadrant of left female breast Left breast DCIS status post bilateral mastectomy with sentinel lymph node biopsy left axilla : grade 1, 3.8 cm, 4 lymph nodes negative, ER 93%, PR 0% Started anastrozole 1 mg daily 06/18/2014,  Switch to Aromasin because of chest pains,  Apparently patient started taking both anastrozole and  Aromasin for 4 months until December 2016 Resumed Aromasin 07/17/2015  Anastrozole toxicities: 1. Hot flashes 2-3 times a week 2. Muscle stiffness and achiness 3. Intermittent headaches and epigastric chest discomfort  While she was on both antiestrogen drugs, she had profound side effects including hot flashes, severe hair loss and feeling extremely fatigued.  Bone density test revealed osteopenia according to her as done by her primary care physician. Her calcium intake has been increased.  Return to clinic in 6 months for follow-up and if she was doing well at that time, we can see her once he.   No orders of the defined types were placed in this encounter.   The patient has a good understanding of the overall plan. she agrees with it. she will call with any problems that may develop before the next visit here.   Rulon Eisenmenger, MD 07/17/2015

## 2015-07-17 NOTE — Telephone Encounter (Signed)
Appointments made and avs pritned for patient °

## 2015-09-17 ENCOUNTER — Telehealth: Payer: Self-pay | Admitting: Hematology and Oncology

## 2015-09-17 DIAGNOSIS — C50412 Malignant neoplasm of upper-outer quadrant of left female breast: Secondary | ICD-10-CM | POA: Diagnosis not present

## 2015-09-17 NOTE — Telephone Encounter (Signed)
I discussed with the patient's niece on the phone regarding her current symptoms of forgetfulness, nausea hair loss and confusion related to taking antiestrogen therapy with exemestane. I believe that the patient is intolerant to antiestrogen therapies and I do not believe switching to any other medication would make any difference because previously she was on other medications and she still had adverse effects.  Patient will stop antiestrogen therapy with exemestane as of 09/17/2015

## 2015-09-24 ENCOUNTER — Other Ambulatory Visit: Payer: Self-pay | Admitting: Hematology and Oncology

## 2015-09-24 NOTE — Telephone Encounter (Signed)
Chart reviewed.

## 2015-10-09 DIAGNOSIS — Z872 Personal history of diseases of the skin and subcutaneous tissue: Secondary | ICD-10-CM | POA: Diagnosis not present

## 2015-10-09 DIAGNOSIS — Z1283 Encounter for screening for malignant neoplasm of skin: Secondary | ICD-10-CM | POA: Diagnosis not present

## 2015-10-09 DIAGNOSIS — L718 Other rosacea: Secondary | ICD-10-CM | POA: Diagnosis not present

## 2015-10-10 DIAGNOSIS — E119 Type 2 diabetes mellitus without complications: Secondary | ICD-10-CM | POA: Diagnosis not present

## 2015-10-11 ENCOUNTER — Telehealth: Payer: Self-pay | Admitting: Family Medicine

## 2015-10-11 NOTE — Telephone Encounter (Signed)
LM for pt to sch AWV, mn ° °

## 2015-10-25 DIAGNOSIS — E1165 Type 2 diabetes mellitus with hyperglycemia: Secondary | ICD-10-CM | POA: Diagnosis not present

## 2015-10-25 DIAGNOSIS — E039 Hypothyroidism, unspecified: Secondary | ICD-10-CM | POA: Diagnosis not present

## 2015-10-25 DIAGNOSIS — E669 Obesity, unspecified: Secondary | ICD-10-CM | POA: Diagnosis not present

## 2015-10-25 DIAGNOSIS — Z6829 Body mass index (BMI) 29.0-29.9, adult: Secondary | ICD-10-CM | POA: Diagnosis not present

## 2015-10-25 DIAGNOSIS — N183 Chronic kidney disease, stage 3 (moderate): Secondary | ICD-10-CM | POA: Diagnosis not present

## 2015-10-25 DIAGNOSIS — Z794 Long term (current) use of insulin: Secondary | ICD-10-CM | POA: Diagnosis not present

## 2015-10-25 DIAGNOSIS — E782 Mixed hyperlipidemia: Secondary | ICD-10-CM | POA: Diagnosis not present

## 2015-10-25 LAB — LIPID PANEL
Cholesterol: 265 mg/dL — AB (ref 0–200)
HDL: 37 mg/dL (ref 35–70)
LDL Cholesterol: 168 mg/dL
Triglycerides: 429 mg/dL — AB (ref 40–160)

## 2015-10-25 LAB — HEMOGLOBIN A1C: HEMOGLOBIN A1C: 7.6

## 2015-10-25 LAB — HEPATIC FUNCTION PANEL
ALT: 15 U/L (ref 7–35)
AST: 10 U/L — AB (ref 13–35)

## 2015-10-25 LAB — HM DIABETES FOOT EXAM: HM DIABETIC FOOT EXAM: ABNORMAL

## 2015-10-25 LAB — BASIC METABOLIC PANEL: CREATININE: 1.3 mg/dL — AB (ref <=1.1)

## 2015-10-25 LAB — TSH: TSH: 1.97 u[IU]/mL (ref <=5.90)

## 2015-11-02 ENCOUNTER — Encounter: Payer: Self-pay | Admitting: Family Medicine

## 2015-11-23 ENCOUNTER — Other Ambulatory Visit: Payer: Self-pay | Admitting: Family Medicine

## 2015-11-23 ENCOUNTER — Encounter: Payer: Self-pay | Admitting: *Deleted

## 2015-11-23 NOTE — Telephone Encounter (Signed)
Sent patient Mychart message in reference to med clarification for metoprolol and omeprazole.

## 2015-11-27 ENCOUNTER — Other Ambulatory Visit: Payer: Self-pay | Admitting: *Deleted

## 2015-11-27 MED ORDER — METOPROLOL TARTRATE 50 MG PO TABS: 50.0000 mg | ORAL_TABLET | Freq: Every day | ORAL | Status: DC

## 2015-12-01 ENCOUNTER — Other Ambulatory Visit: Payer: Self-pay | Admitting: Family Medicine

## 2015-12-04 ENCOUNTER — Telehealth: Payer: Self-pay | Admitting: *Deleted

## 2015-12-04 ENCOUNTER — Encounter: Payer: Self-pay | Admitting: Family Medicine

## 2015-12-04 MED ORDER — METOPROLOL SUCCINATE ER 50 MG PO TB24: 50.0000 mg | ORAL_TABLET | Freq: Every day | ORAL | Status: DC

## 2015-12-04 NOTE — Telephone Encounter (Signed)
New Rx sent to pharmacy

## 2015-12-04 NOTE — Telephone Encounter (Signed)
Pharmacist left a voicemail needing clarification on Metoprolol. Pharmacist stated that they received two difference scripts and they need to know if patient is to get Metoprolol succinate or tartrate. The scripts were sent in on two different days?

## 2015-12-19 DIAGNOSIS — B351 Tinea unguium: Secondary | ICD-10-CM | POA: Diagnosis not present

## 2015-12-19 DIAGNOSIS — E1142 Type 2 diabetes mellitus with diabetic polyneuropathy: Secondary | ICD-10-CM | POA: Diagnosis not present

## 2015-12-24 ENCOUNTER — Encounter: Payer: Self-pay | Admitting: Hematology and Oncology

## 2016-01-09 NOTE — Progress Notes (Signed)
Recevied VM from pt's family member asking if pt still needed to come in for appointment with Dr. Lindi Adie on 01/15/16.  Caller states pt stopped anti estrogen therapy and is still being seen by Dr. [name]. Caller questioning need for follow up appointment.  Spoke with Dr. Lindi Adie who said he was fine either way.  Per Dr. Lindi Adie, he is willing to see patient as scheduled or cancel appointment if pt just wishes to be followed by Dr. [name].  I called pt back to discuss but was unable to reach pt.  Left VM explaining this information and told pt to call me back to let me know which she preferred.  Will cancel appointment as necessary should she chose to do so.

## 2016-01-13 ENCOUNTER — Encounter: Payer: Self-pay | Admitting: Hematology and Oncology

## 2016-01-15 ENCOUNTER — Ambulatory Visit: Payer: Medicare Other | Admitting: Hematology and Oncology

## 2016-01-24 DIAGNOSIS — Z794 Long term (current) use of insulin: Secondary | ICD-10-CM | POA: Diagnosis not present

## 2016-01-24 DIAGNOSIS — N183 Chronic kidney disease, stage 3 (moderate): Secondary | ICD-10-CM | POA: Diagnosis not present

## 2016-01-24 DIAGNOSIS — E669 Obesity, unspecified: Secondary | ICD-10-CM | POA: Diagnosis not present

## 2016-01-24 DIAGNOSIS — E1165 Type 2 diabetes mellitus with hyperglycemia: Secondary | ICD-10-CM | POA: Diagnosis not present

## 2016-01-24 DIAGNOSIS — E039 Hypothyroidism, unspecified: Secondary | ICD-10-CM | POA: Diagnosis not present

## 2016-01-24 DIAGNOSIS — Z6829 Body mass index (BMI) 29.0-29.9, adult: Secondary | ICD-10-CM | POA: Diagnosis not present

## 2016-01-24 DIAGNOSIS — E782 Mixed hyperlipidemia: Secondary | ICD-10-CM | POA: Diagnosis not present

## 2016-01-24 LAB — TSH: TSH: 1.42 u[IU]/mL (ref <=5.90)

## 2016-01-24 LAB — HEMOGLOBIN A1C: Hemoglobin A1C: 8.2

## 2016-01-25 ENCOUNTER — Ambulatory Visit (INDEPENDENT_AMBULATORY_CARE_PROVIDER_SITE_OTHER): Payer: Medicare Other | Admitting: Family Medicine

## 2016-01-25 ENCOUNTER — Encounter: Payer: Self-pay | Admitting: Family Medicine

## 2016-01-25 VITALS — BP 170/84 | HR 64 | Temp 98.1°F | Ht 65.5 in | Wt 185.8 lb

## 2016-01-25 DIAGNOSIS — N2581 Secondary hyperparathyroidism of renal origin: Secondary | ICD-10-CM

## 2016-01-25 DIAGNOSIS — E785 Hyperlipidemia, unspecified: Secondary | ICD-10-CM

## 2016-01-25 DIAGNOSIS — E1122 Type 2 diabetes mellitus with diabetic chronic kidney disease: Secondary | ICD-10-CM

## 2016-01-25 DIAGNOSIS — I2510 Atherosclerotic heart disease of native coronary artery without angina pectoris: Secondary | ICD-10-CM | POA: Diagnosis not present

## 2016-01-25 DIAGNOSIS — C50412 Malignant neoplasm of upper-outer quadrant of left female breast: Secondary | ICD-10-CM

## 2016-01-25 DIAGNOSIS — E1140 Type 2 diabetes mellitus with diabetic neuropathy, unspecified: Secondary | ICD-10-CM

## 2016-01-25 DIAGNOSIS — E1165 Type 2 diabetes mellitus with hyperglycemia: Secondary | ICD-10-CM

## 2016-01-25 DIAGNOSIS — R0789 Other chest pain: Secondary | ICD-10-CM | POA: Diagnosis not present

## 2016-01-25 DIAGNOSIS — H9192 Unspecified hearing loss, left ear: Secondary | ICD-10-CM

## 2016-01-25 DIAGNOSIS — I10 Essential (primary) hypertension: Secondary | ICD-10-CM | POA: Diagnosis not present

## 2016-01-25 DIAGNOSIS — G4700 Insomnia, unspecified: Secondary | ICD-10-CM

## 2016-01-25 DIAGNOSIS — Z7189 Other specified counseling: Secondary | ICD-10-CM

## 2016-01-25 DIAGNOSIS — Z0000 Encounter for general adult medical examination without abnormal findings: Secondary | ICD-10-CM

## 2016-01-25 DIAGNOSIS — N183 Chronic kidney disease, stage 3 (moderate): Secondary | ICD-10-CM | POA: Diagnosis not present

## 2016-01-25 DIAGNOSIS — E039 Hypothyroidism, unspecified: Secondary | ICD-10-CM

## 2016-01-25 DIAGNOSIS — IMO0002 Reserved for concepts with insufficient information to code with codable children: Secondary | ICD-10-CM

## 2016-01-25 DIAGNOSIS — M8580 Other specified disorders of bone density and structure, unspecified site: Secondary | ICD-10-CM

## 2016-01-25 LAB — RENAL FUNCTION PANEL
ALBUMIN: 4.1 g/dL (ref 3.5–5.2)
BUN: 26 mg/dL — ABNORMAL HIGH (ref 6–23)
CALCIUM: 9.5 mg/dL (ref 8.4–10.5)
CO2: 27 mEq/L (ref 19–32)
CREATININE: 1.32 mg/dL — AB (ref 0.40–1.20)
Chloride: 105 mEq/L (ref 96–112)
GFR: 41.94 mL/min — AB (ref >60.00)
GLUCOSE: 174 mg/dL — AB (ref 70–99)
POTASSIUM: 4.3 meq/L (ref 3.5–5.1)
Phosphorus: 4.2 mg/dL (ref 2.3–4.6)
SODIUM: 138 meq/L (ref 135–145)

## 2016-01-25 LAB — LIPID PANEL
CHOL/HDL RATIO: 6
Cholesterol: 206 mg/dL — ABNORMAL HIGH (ref 0–200)
HDL: 32.6 mg/dL — ABNORMAL LOW (ref >39.00)
NONHDL: 173.43
TRIGLYCERIDES: 259 mg/dL — AB (ref 0.0–149.0)
VLDL: 51.8 mg/dL — ABNORMAL HIGH (ref 0.0–40.0)

## 2016-01-25 LAB — LDL CHOLESTEROL, DIRECT: Direct LDL: 129 mg/dL

## 2016-01-25 LAB — VITAMIN D 25 HYDROXY (VIT D DEFICIENCY, FRACTURES): VITD: 63.43 ng/mL (ref 30.00–100.00)

## 2016-01-25 MED ORDER — METOPROLOL SUCCINATE ER 50 MG PO TB24: 50.0000 mg | ORAL_TABLET | Freq: Every day | ORAL | 3 refills | Status: DC

## 2016-01-25 MED ORDER — LEVOTHYROXINE SODIUM 25 MCG PO TABS: ORAL_TABLET | ORAL | 0 refills | Status: DC

## 2016-01-25 MED ORDER — OMEPRAZOLE 40 MG PO CPDR: DELAYED_RELEASE_CAPSULE | ORAL | 1 refills | Status: DC

## 2016-01-25 MED ORDER — POTASSIUM CHLORIDE CRYS ER 20 MEQ PO TBCR: EXTENDED_RELEASE_TABLET | ORAL | 3 refills | Status: DC

## 2016-01-25 MED ORDER — FUROSEMIDE 20 MG PO TABS: 10.0000 mg | ORAL_TABLET | Freq: Every day | ORAL | 3 refills | Status: DC

## 2016-01-25 MED ORDER — TEMAZEPAM 7.5 MG PO CAPS: 7.5000 mg | ORAL_CAPSULE | Freq: Every evening | ORAL | 0 refills | Status: DC | PRN

## 2016-01-25 NOTE — Progress Notes (Signed)
Pre visit review using our clinic review tool, if applicable. No additional management support is needed unless otherwise documented below in the visit note. 

## 2016-01-25 NOTE — Patient Instructions (Addendum)
Let's do a several week trial of temazepam for sleep - printed today. Bring me copy of your living will. EKG today.  Labs today.  You are doing well today. Return as needed or in 6 months for follow up.   Health Maintenance, Female Adopting a healthy lifestyle and getting preventive care can go a long way to promote health and wellness. Talk with your health care provider about what schedule of regular examinations is right for you. This is a good chance for you to check in with your provider about disease prevention and staying healthy. In between checkups, there are plenty of things you can do on your own. Experts have done a lot of research about which lifestyle changes and preventive measures are most likely to keep you healthy. Ask your health care provider for more information. WEIGHT AND DIET  Eat a healthy diet  Be sure to include plenty of vegetables, fruits, low-fat dairy products, and lean protein.  Do not eat a lot of foods high in solid fats, added sugars, or salt.  Get regular exercise. This is one of the most important things you can do for your health.  Most adults should exercise for at least 150 minutes each week. The exercise should increase your heart rate and make you sweat (moderate-intensity exercise).  Most adults should also do strengthening exercises at least twice a week. This is in addition to the moderate-intensity exercise.  Maintain a healthy weight  Body mass index (BMI) is a measurement that can be used to identify possible weight problems. It estimates body fat based on height and weight. Your health care provider can help determine your BMI and help you achieve or maintain a healthy weight.  For females 15 years of age and older:   A BMI below 18.5 is considered underweight.  A BMI of 18.5 to 24.9 is normal.  A BMI of 25 to 29.9 is considered overweight.  A BMI of 30 and above is considered obese.  Watch levels of cholesterol and blood  lipids  You should start having your blood tested for lipids and cholesterol at 73 years of age, then have this test every 5 years.  You may need to have your cholesterol levels checked more often if:  Your lipid or cholesterol levels are high.  You are older than 73 years of age.  You are at high risk for heart disease.  CANCER SCREENING   Lung Cancer  Lung cancer screening is recommended for adults 55-23 years old who are at high risk for lung cancer because of a history of smoking.  A yearly low-dose CT scan of the lungs is recommended for people who:  Currently smoke.  Have quit within the past 15 years.  Have at least a 30-pack-year history of smoking. A pack year is smoking an average of one pack of cigarettes a day for 1 year.  Yearly screening should continue until it has been 15 years since you quit.  Yearly screening should stop if you develop a health problem that would prevent you from having lung cancer treatment.  Breast Cancer  Practice breast self-awareness. This means understanding how your breasts normally appear and feel.  It also means doing regular breast self-exams. Let your health care provider know about any changes, no matter how small.  If you are in your 20s or 30s, you should have a clinical breast exam (CBE) by a health care provider every 1-3 years as part of a regular health exam.  If you are 40 or older, have a CBE every year. Also consider having a breast X-ray (mammogram) every year.  If you have a family history of breast cancer, talk to your health care provider about genetic screening.  If you are at high risk for breast cancer, talk to your health care provider about having an MRI and a mammogram every year.  Breast cancer gene (BRCA) assessment is recommended for women who have family members with BRCA-related cancers. BRCA-related cancers include:  Breast.  Ovarian.  Tubal.  Peritoneal cancers.  Results of the assessment  will determine the need for genetic counseling and BRCA1 and BRCA2 testing. Cervical Cancer Your health care provider may recommend that you be screened regularly for cancer of the pelvic organs (ovaries, uterus, and vagina). This screening involves a pelvic examination, including checking for microscopic changes to the surface of your cervix (Pap test). You may be encouraged to have this screening done every 3 years, beginning at age 24.  For women ages 59-65, health care providers may recommend pelvic exams and Pap testing every 3 years, or they may recommend the Pap and pelvic exam, combined with testing for human papilloma virus (HPV), every 5 years. Some types of HPV increase your risk of cervical cancer. Testing for HPV may also be done on women of any age with unclear Pap test results.  Other health care providers may not recommend any screening for nonpregnant women who are considered low risk for pelvic cancer and who do not have symptoms. Ask your health care provider if a screening pelvic exam is right for you.  If you have had past treatment for cervical cancer or a condition that could lead to cancer, you need Pap tests and screening for cancer for at least 20 years after your treatment. If Pap tests have been discontinued, your risk factors (such as having a new sexual partner) need to be reassessed to determine if screening should resume. Some women have medical problems that increase the chance of getting cervical cancer. In these cases, your health care provider may recommend more frequent screening and Pap tests. Colorectal Cancer  This type of cancer can be detected and often prevented.  Routine colorectal cancer screening usually begins at 73 years of age and continues through 73 years of age.  Your health care provider may recommend screening at an earlier age if you have risk factors for colon cancer.  Your health care provider may also recommend using home test kits to check  for hidden blood in the stool.  A small camera at the end of a tube can be used to examine your colon directly (sigmoidoscopy or colonoscopy). This is done to check for the earliest forms of colorectal cancer.  Routine screening usually begins at age 26.  Direct examination of the colon should be repeated every 5-10 years through 73 years of age. However, you may need to be screened more often if early forms of precancerous polyps or small growths are found. Skin Cancer  Check your skin from head to toe regularly.  Tell your health care provider about any new moles or changes in moles, especially if there is a change in a mole's shape or color.  Also tell your health care provider if you have a mole that is larger than the size of a pencil eraser.  Always use sunscreen. Apply sunscreen liberally and repeatedly throughout the day.  Protect yourself by wearing long sleeves, pants, a wide-brimmed hat, and sunglasses whenever you  are outside. HEART DISEASE, DIABETES, AND HIGH BLOOD PRESSURE   High blood pressure causes heart disease and increases the risk of stroke. High blood pressure is more likely to develop in:  People who have blood pressure in the high end of the normal range (130-139/85-89 mm Hg).  People who are overweight or obese.  People who are African American.  If you are 18-39 years of age, have your blood pressure checked every 3-5 years. If you are 40 years of age or older, have your blood pressure checked every year. You should have your blood pressure measured twice--once when you are at a hospital or clinic, and once when you are not at a hospital or clinic. Record the average of the two measurements. To check your blood pressure when you are not at a hospital or clinic, you can use:  An automated blood pressure machine at a pharmacy.  A home blood pressure monitor.  If you are between 55 years and 79 years old, ask your health care provider if you should take  aspirin to prevent strokes.  Have regular diabetes screenings. This involves taking a blood sample to check your fasting blood sugar level.  If you are at a normal weight and have a low risk for diabetes, have this test once every three years after 73 years of age.  If you are overweight and have a high risk for diabetes, consider being tested at a younger age or more often. PREVENTING INFECTION  Hepatitis B  If you have a higher risk for hepatitis B, you should be screened for this virus. You are considered at high risk for hepatitis B if:  You were born in a country where hepatitis B is common. Ask your health care provider which countries are considered high risk.  Your parents were born in a high-risk country, and you have not been immunized against hepatitis B (hepatitis B vaccine).  You have HIV or AIDS.  You use needles to inject street drugs.  You live with someone who has hepatitis B.  You have had sex with someone who has hepatitis B.  You get hemodialysis treatment.  You take certain medicines for conditions, including cancer, organ transplantation, and autoimmune conditions. Hepatitis C  Blood testing is recommended for:  Everyone born from 1945 through 1965.  Anyone with known risk factors for hepatitis C. Sexually transmitted infections (STIs)  You should be screened for sexually transmitted infections (STIs) including gonorrhea and chlamydia if:  You are sexually active and are younger than 73 years of age.  You are older than 73 years of age and your health care provider tells you that you are at risk for this type of infection.  Your sexual activity has changed since you were last screened and you are at an increased risk for chlamydia or gonorrhea. Ask your health care provider if you are at risk.  If you do not have HIV, but are at risk, it may be recommended that you take a prescription medicine daily to prevent HIV infection. This is called  pre-exposure prophylaxis (PrEP). You are considered at risk if:  You are sexually active and do not regularly use condoms or know the HIV status of your partner(s).  You take drugs by injection.  You are sexually active with a partner who has HIV. Talk with your health care provider about whether you are at high risk of being infected with HIV. If you choose to begin PrEP, you should first be tested for   HIV. You should then be tested every 3 months for as long as you are taking PrEP.  PREGNANCY   If you are premenopausal and you may become pregnant, ask your health care provider about preconception counseling.  If you may become pregnant, take 400 to 800 micrograms (mcg) of folic acid every day.  If you want to prevent pregnancy, talk to your health care provider about birth control (contraception). OSTEOPOROSIS AND MENOPAUSE   Osteoporosis is a disease in which the bones lose minerals and strength with aging. This can result in serious bone fractures. Your risk for osteoporosis can be identified using a bone density scan.  If you are 65 years of age or older, or if you are at risk for osteoporosis and fractures, ask your health care provider if you should be screened.  Ask your health care provider whether you should take a calcium or vitamin D supplement to lower your risk for osteoporosis.  Menopause may have certain physical symptoms and risks.  Hormone replacement therapy may reduce some of these symptoms and risks. Talk to your health care provider about whether hormone replacement therapy is right for you.  HOME CARE INSTRUCTIONS   Schedule regular health, dental, and eye exams.  Stay current with your immunizations.   Do not use any tobacco products including cigarettes, chewing tobacco, or electronic cigarettes.  If you are pregnant, do not drink alcohol.  If you are breastfeeding, limit how much and how often you drink alcohol.  Limit alcohol intake to no more than 1  drink per day for nonpregnant women. One drink equals 12 ounces of beer, 5 ounces of wine, or 1 ounces of hard liquor.  Do not use street drugs.  Do not share needles.  Ask your health care provider for help if you need support or information about quitting drugs.  Tell your health care provider if you often feel depressed.  Tell your health care provider if you have ever been abused or do not feel safe at home.   This information is not intended to replace advice given to you by your health care provider. Make sure you discuss any questions you have with your health care provider.   Document Released: 12/30/2010 Document Revised: 07/07/2014 Document Reviewed: 05/18/2013 Elsevier Interactive Patient Education 2016 [employer] Inc.  

## 2016-01-25 NOTE — Progress Notes (Addendum)
BP (!) 170/84 Comment: No BP meds this AM  Pulse 64   Temp 98.1 F (36.7 C) (Oral)   Ht 5' 5.5" (1.664 m)   Wt 185 lb 12 oz (84.3 kg)   BMI 30.44 kg/m    CC: medicare wellness visit Subjective:    Patient ID: Julia Banks, female    DOB: 08-27-1940, 75 y.o.   MRN: [id number]  HPI: Julia Banks is a 75 y.o. female presenting on 01/25/2016 for Annual Exam   Here with niece [name] today who is in healthcare field.  BP elevated today - did not take antihypertensive medications this morning as she thought she shouldn't for fasting labs. Endorses BP well controlled at home. Ongoing cough over last few years. Lower back pain started a few days ago. Denies inciting trauma/injury. Positional. No radiation down legs, numbness or weakness of legs. Having more aches - R foot, R knee, L hip. S/p R knee steroid injection. Requests permanent DMV placard as at times she is unable to walk >200 ft without stopping to rest. Has cane, does not regularly use.   Some chest discomfort attributed to toujeo, now back on lantus and better. Requests EKG.   Insomnia both initiation and maintenance - failed benadryl, melatonin, and trazodone. Asks about other options. Endorses good sleep hygiene.  Recent weight loss attributed to jardiance.  A1c and TSH checked yesterday by endocrinology. Due or other labs.   Failed hearing screen today left side. Denies trouble with hearing but after discussion with niece agrees to audiology referral. Sees eye doctor.  No falls in last year. Denies depression/anhedonia, sadness. Lost husband 19.  Preventative: COLONOSCOPY 12/2014 1 TA, rpt 5 yrs [facility])  Well woman - s/p hysterectomy for benign reason.Last well woman exam with Dr. Helane Rima 03/2015. Seen yearly  Breast cancer - s/p treatment. Sees Dr Sonny Dandy in [place]. Unable to tolerate anastrazole, sees Dr [name] regularly.  DEXA - through Dr Christen Butter office. ?osteopenia Flu shot - declines Tetanus -  unsure prevnar 2006, pneumovax 2014 Shingles shot - not currently interested due to cost Advanced directives: has this at home. HC proxy would be niece [name] Flinchum. Aasked to bring me copy Seat belt use discussed Sunscreen use discussed. No changing moles on skin. Sees derm.  Widow, husband Fritz Pickerel passed 2015. Has 3 dogs No children. 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 Interim medical history since our last visit reviewed. Allergies and medications reviewed and updated. Current Outpatient Prescriptions on File Prior to Visit  Medication Sig  . albuterol (PROVENTIL HFA;VENTOLIN HFA) 108 (90 BASE) MCG/ACT inhaler Inhale into the lungs every 6 (six) hours as needed for wheezing or shortness of breath.  . Ascorbic Acid (VITAMIN C WITH ROSE HIPS) 500 MG tablet Take 500 mg by mouth daily.  . Cholecalciferol (VITAMIN D PO) Take 5,000 Units by mouth.  . docusate sodium (COLACE) 100 MG capsule Take 100 mg by mouth 2 (two) times daily.  Marland Kitchen doxycycline (VIBRAMYCIN) 100 MG capsule Take 100 mg by mouth daily as needed (FOR FACE).   . fluconazole (DIFLUCAN) 150 MG tablet Take 150 mg by mouth daily as needed (TAKE AS NEEDED WHEN TAKING AN ANTIBIOTIC).   Marland Kitchen Fluticasone-Salmeterol (ADVAIR) 250-50 MCG/DOSE AEPB Inhale 1 puff into the lungs 2 (two) times daily.  Marland Kitchen  meloxicam (MOBIC) 7.5 MG tablet Take 7.5 mg by mouth daily. with food  . niacin 50 MG tablet Take 50 mg by mouth at bedtime. No flush  . Omega-3 Fatty Acids (OMEGA 3 PO) Take by mouth. 180 EPA/120DHA  . OVER THE COUNTER MEDICATION Calcium magnesium 1000mg  day  . OVER THE COUNTER MEDICATION Women's multivitamin with iron  . vitamin B-12 (CYANOCOBALAMIN) 1000 MCG tablet Take  1,000 mcg by mouth daily.   No current facility-administered medications on file prior to visit.     Review of Systems Per HPI unless specifically indicated in ROS section     Objective:    BP (!) 170/84 Comment: No BP meds this AM  Pulse 64   Temp 98.1 F (36.7 C) (Oral)   Ht 5' 5.5" (1.664 m)   Wt 185 lb 12 oz (84.3 kg)   BMI 30.44 kg/m   Wt Readings from Last 3 Encounters:  01/25/16 185 lb 12 oz (84.3 kg)  07/17/15 190 lb 8 oz (86.4 kg)  04/02/15 200 lb (90.7 kg)    Physical Exam  Constitutional: She is oriented to person, place, and time. She appears well-developed and well-nourished. No distress.  HENT:  Head: Normocephalic and atraumatic.  Right Ear: Hearing, tympanic membrane, external ear and ear canal normal.  Left Ear: Hearing, tympanic membrane, external ear and ear canal normal.  Nose: Nose normal.  Mouth/Throat: Uvula is midline, oropharynx is clear and moist and mucous membranes are normal. No oropharyngeal exudate, posterior oropharyngeal edema or posterior oropharyngeal erythema.  Eyes: Conjunctivae and EOM are normal. Pupils are equal, round, and reactive to light. No scleral icterus.  Neck: Normal range of motion. Neck supple. Carotid bruit is not present. No thyromegaly present.  Cardiovascular: Normal rate, regular rhythm, normal heart sounds and intact distal pulses.   No murmur heard. Pulses:      Radial pulses are 2+ on the right side, and 2+ on the left side.  Pulmonary/Chest: Effort normal and breath sounds normal. No respiratory distress. She has no wheezes. She has no rales.  Abdominal: Soft. Bowel sounds are normal. She exhibits no distension and no mass. There is no tenderness. There is no rebound and no guarding.  Musculoskeletal: Normal range of motion. She exhibits no edema.  No pain midline spine + paraspinous mm tenderness Neg SLR bilaterally. No pain with int/ext rotation at hip.  Lymphadenopathy:    She has no cervical adenopathy.   Neurological: She is alert and oriented to person, place, and time.  CN grossly intact, station and gait intact Recall 3/3 calcuation 5/5 serial 3s   Skin: Skin is warm and dry. No rash noted.  Psychiatric: She has a normal mood and affect. Her behavior is normal. Judgment and thought content normal.  Nursing note and vitals reviewed.  Results for orders placed or performed in visit on 01/25/16  Lipid panel  Result Value Ref Range   Cholesterol 206 (H) 0 - 200 mg/dL   Triglycerides 259.0 (H) 0.0 - 149.0 mg/dL   HDL 32.60 (L) >39.00 mg/dL   VLDL 51.8 (H) 0.0 - 40.0 mg/dL   Total CHOL/HDL Ratio 6    NonHDL 173.43   Renal function panel  Result Value Ref Range   Sodium 138 135 - 145 mEq/L   Potassium 4.3 3.5 - 5.1 mEq/L   Chloride 105 96 - 112 mEq/L   CO2 27 19 - 32 mEq/L   Calcium 9.5 8.4 - 10.5 mg/dL   Albumin 4.1 3.5 -  5.2 g/dL   BUN 26 (H) 6 - 23 mg/dL   Creatinine, Ser 1.32 (H) 0.40 - 1.20 mg/dL   Glucose, Bld 174 (H) 70 - 99 mg/dL   Phosphorus 4.2 2.3 - 4.6 mg/dL   GFR 41.94 (L) >60.00 mL/min  VITAMIN D 25 Hydroxy (Vit-D Deficiency, Fractures)  Result Value Ref Range   VITD 63.43 30.00 - 100.00 ng/mL  LDL cholesterol, direct  Result Value Ref Range   Direct LDL 129.0 mg/dL      Assessment & Plan:   Problem List Items Addressed This Visit    Advanced care planning/counseling discussion    Advanced directives: has this at home. HC proxy would be niece [name] Flinchum. Aasked to bring me copy      Breast cancer of upper-outer quadrant of left female breast [facility])    Followed by onc Dr Lindi Adie and surgery Dr [name].       Chest discomfort    Pt endorses atypical chest discomfort that came on when toujeo was started and has now resolved with switch to lantus. In comorbidities and h/o nonobstructive CAD, update EKG today. EKG - sinus bradycardia 55, normal axis, intervals, no acute ST/T changes, good R wave progression.       Relevant Orders   EKG 12-Lead  (Completed)   Coronary artery disease    Update EKG. Latest catheterization 10/2013 - mild 2v nonobstructive disease, EF 60%.       Relevant Medications   furosemide (LASIX) 20 MG tablet   metoprolol succinate (TOPROL-XL) 50 MG 24 hr tablet   Diabetes mellitus with stage 3 chronic kidney disease ([place])    Update labs.       Relevant Medications   Insulin Glargine (LANTUS SOLOSTAR) 100 UNIT/ML Solostar Pen   Other Relevant Orders   VITAMIN D 25 Hydroxy (Vit-D Deficiency, Fractures) (Completed)   Essential hypertension, benign    BP markedly elevated, she did not take AM meds, but states when she takes them well controlled at home. No changes today. She is on lasix 10mg  and toprol xl daily.      Relevant Medications   furosemide (LASIX) 20 MG tablet   metoprolol succinate (TOPROL-XL) 50 MG 24 hr tablet   HLD (hyperlipidemia)    On fish oil, niacin. Unsure if still taking atorvastatin - see result note. Update FLP, consider discontinue niacin.       Relevant Medications   furosemide (LASIX) 20 MG tablet   metoprolol succinate (TOPROL-XL) 50 MG 24 hr tablet   Other Relevant Orders   Lipid panel (Completed)   LDL cholesterol, direct (Completed)   Hypothyroidism    Appreciate Endo care - pt states TSH recently checked. Will await copy of labs from endo.      Relevant Medications   levothyroxine (SYNTHROID, LEVOTHROID) 25 MCG tablet   metoprolol succinate (TOPROL-XL) 50 MG 24 hr tablet   Insomnia    She endorses good sleep hygiene measures. Failed melatonin, benadryl, trazodone.  Will trial low dose restoril 7.5mg  nightly PRN insomnia.  Discussed pros and cons of controlled substances. Discussed recommended short term use of med. Discussed risks of medication including dependence, tolerance, and addiction/abuse potential.       Left ear hearing loss    Refer to audiology per pt request. No cerumen in canals.      Relevant Orders   Ambulatory referral to Audiology    Medicare annual wellness visit, subsequent - Primary    I have personally reviewed the Medicare Annual  Wellness questionnaire and have noted 1. The patient's medical and social history 2. Their use of alcohol, tobacco or illicit drugs 3. Their current medications and supplements 4. The patient's functional ability including ADL's, fall risks, home safety risks and hearing or visual impairment. Cognitive function has been assessed and addressed as indicated.  5. Diet and physical activity 6. Evidence for depression or mood disorders The patients weight, height, BMI have been recorded in the chart. I have made referrals, counseling and provided education to the patient based on review of the above and I have provided the pt with a written personalized care plan for preventive services. Provider list updated.. See scanned questionairre as needed for further documentation. Reviewed preventative protocols and updated unless pt declined.       Osteopenia    DEXA with GYN. Continue calcium and vitamin D.      Secondary hyperparathyroidism of renal origin ([place])    Check PTH       Relevant Orders   Renal function panel (Completed)   Parathyroid hormone, intact (no Ca)   Type 2 diabetes, uncontrolled, with neuropathy ([place])    Appreciate endo care. Await recent A1c.       Relevant Medications   Insulin Glargine (LANTUS SOLOSTAR) 100 UNIT/ML Solostar Pen    Other Visit Diagnoses   None.      Follow up plan: Return in about 6 months (around 07/27/2016), or as needed, for follow up visit.  Ria Bush, MD

## 2016-01-27 ENCOUNTER — Encounter: Payer: Self-pay | Admitting: Family Medicine

## 2016-01-27 ENCOUNTER — Other Ambulatory Visit: Payer: Self-pay | Admitting: Family Medicine

## 2016-01-27 DIAGNOSIS — Z7189 Other specified counseling: Secondary | ICD-10-CM | POA: Insufficient documentation

## 2016-01-27 DIAGNOSIS — H903 Sensorineural hearing loss, bilateral: Secondary | ICD-10-CM

## 2016-01-27 DIAGNOSIS — R0789 Other chest pain: Secondary | ICD-10-CM | POA: Insufficient documentation

## 2016-01-27 HISTORY — DX: Sensorineural hearing loss, bilateral: H90.3

## 2016-01-27 NOTE — Assessment & Plan Note (Signed)
Advanced directives: has this at home. HC proxy would be niece Julia Banks. Aasked to bring me copy.  

## 2016-01-27 NOTE — Assessment & Plan Note (Addendum)
On fish oil, niacin. Unsure if still taking atorvastatin - see result note. Update FLP, consider discontinue niacin.

## 2016-01-27 NOTE — Assessment & Plan Note (Addendum)
Refer to audiology per pt request. No cerumen in canals.

## 2016-01-27 NOTE — Assessment & Plan Note (Signed)
Check PTH. °

## 2016-01-27 NOTE — Assessment & Plan Note (Signed)
She endorses good sleep hygiene measures. Failed melatonin, benadryl, trazodone.  Will trial low dose restoril 7.5mg  nightly PRN insomnia.  Discussed pros and cons of controlled substances. Discussed recommended short term use of med. Discussed risks of medication including dependence, tolerance, and addiction/abuse potential.

## 2016-01-27 NOTE — Assessment & Plan Note (Addendum)
DEXA with GYN. Continue calcium and vitamin D.

## 2016-01-27 NOTE — Assessment & Plan Note (Signed)

## 2016-01-27 NOTE — Assessment & Plan Note (Signed)
Pt endorses atypical chest discomfort that came on when toujeo was started and has now resolved with switch to lantus. In comorbidities and h/o nonobstructive CAD, update EKG today. EKG - sinus bradycardia 55, normal axis, intervals, no acute ST/T changes, good R wave progression.

## 2016-01-27 NOTE — Assessment & Plan Note (Signed)
Followed by onc Dr Lindi Adie and surgery Dr [name].

## 2016-01-27 NOTE — Assessment & Plan Note (Addendum)
BP markedly elevated, she did not take AM meds, but states when she takes them well controlled at home. No changes today. She is on lasix 10mg  and toprol xl daily.

## 2016-01-27 NOTE — Assessment & Plan Note (Addendum)
Appreciate Endo care - pt states TSH recently checked. Will await copy of labs from endo.

## 2016-01-27 NOTE — Assessment & Plan Note (Signed)
Appreciate endo care. Await recent A1c.

## 2016-01-27 NOTE — Assessment & Plan Note (Signed)
Update labs.  

## 2016-01-27 NOTE — Assessment & Plan Note (Signed)
Update EKG. Latest catheterization 10/2013 - mild 2v nonobstructive disease, EF 60%.

## 2016-01-28 LAB — PARATHYROID HORMONE, INTACT (NO CA): PTH: 100 pg/mL — AB (ref 14–64)

## 2016-02-02 ENCOUNTER — Encounter: Payer: Self-pay | Admitting: Family Medicine

## 2016-03-02 ENCOUNTER — Other Ambulatory Visit: Payer: Self-pay | Admitting: Family Medicine

## 2016-03-05 MED ORDER — TEMAZEPAM 7.5 MG PO CAPS: 7.5000 mg | ORAL_CAPSULE | Freq: Every evening | ORAL | 0 refills | Status: DC | PRN

## 2016-03-05 NOTE — Telephone Encounter (Signed)
Left refill on voice mail at pharmacy MyChart message sent to pt

## 2016-03-05 NOTE — Telephone Encounter (Signed)
plz phone in. 

## 2016-03-11 DIAGNOSIS — Z853 Personal history of malignant neoplasm of breast: Secondary | ICD-10-CM | POA: Diagnosis not present

## 2016-03-11 DIAGNOSIS — N63 Unspecified lump in breast: Secondary | ICD-10-CM | POA: Diagnosis not present

## 2016-03-13 DIAGNOSIS — R2232 Localized swelling, mass and lump, left upper limb: Secondary | ICD-10-CM | POA: Diagnosis not present

## 2016-03-28 ENCOUNTER — Encounter: Payer: Self-pay | Admitting: Family Medicine

## 2016-03-30 LAB — HM MAMMOGRAPHY: HM MAMMO: NORMAL (ref 0–4)

## 2016-04-16 DIAGNOSIS — C50412 Malignant neoplasm of upper-outer quadrant of left female breast: Secondary | ICD-10-CM | POA: Diagnosis not present

## 2016-04-17 DIAGNOSIS — Z01419 Encounter for gynecological examination (general) (routine) without abnormal findings: Secondary | ICD-10-CM | POA: Diagnosis not present

## 2016-04-17 DIAGNOSIS — Z6830 Body mass index (BMI) 30.0-30.9, adult: Secondary | ICD-10-CM | POA: Diagnosis not present

## 2016-04-21 ENCOUNTER — Other Ambulatory Visit: Payer: Self-pay | Admitting: Radiology

## 2016-04-21 DIAGNOSIS — R599 Enlarged lymph nodes, unspecified: Secondary | ICD-10-CM | POA: Diagnosis not present

## 2016-04-21 DIAGNOSIS — R920 Mammographic microcalcification found on diagnostic imaging of breast: Secondary | ICD-10-CM | POA: Diagnosis not present

## 2016-04-21 DIAGNOSIS — R590 Localized enlarged lymph nodes: Secondary | ICD-10-CM | POA: Diagnosis not present

## 2016-04-21 DIAGNOSIS — Z853 Personal history of malignant neoplasm of breast: Secondary | ICD-10-CM | POA: Diagnosis not present

## 2016-04-29 DIAGNOSIS — N183 Chronic kidney disease, stage 3 (moderate): Secondary | ICD-10-CM | POA: Diagnosis not present

## 2016-04-29 DIAGNOSIS — Z794 Long term (current) use of insulin: Secondary | ICD-10-CM | POA: Diagnosis not present

## 2016-04-29 DIAGNOSIS — E039 Hypothyroidism, unspecified: Secondary | ICD-10-CM | POA: Diagnosis not present

## 2016-04-29 DIAGNOSIS — E782 Mixed hyperlipidemia: Secondary | ICD-10-CM | POA: Diagnosis not present

## 2016-04-29 DIAGNOSIS — E1165 Type 2 diabetes mellitus with hyperglycemia: Secondary | ICD-10-CM | POA: Diagnosis not present

## 2016-04-29 DIAGNOSIS — Z6830 Body mass index (BMI) 30.0-30.9, adult: Secondary | ICD-10-CM | POA: Diagnosis not present

## 2016-04-29 DIAGNOSIS — E669 Obesity, unspecified: Secondary | ICD-10-CM | POA: Diagnosis not present

## 2016-05-07 ENCOUNTER — Encounter: Payer: Self-pay | Admitting: Family Medicine

## 2016-05-09 DIAGNOSIS — C50412 Malignant neoplasm of upper-outer quadrant of left female breast: Secondary | ICD-10-CM | POA: Diagnosis not present

## 2016-06-04 ENCOUNTER — Other Ambulatory Visit: Payer: Self-pay | Admitting: Family Medicine

## 2016-06-05 ENCOUNTER — Ambulatory Visit: Payer: Medicare Other | Admitting: Audiology

## 2016-06-24 DIAGNOSIS — B351 Tinea unguium: Secondary | ICD-10-CM | POA: Diagnosis not present

## 2016-06-24 DIAGNOSIS — E1142 Type 2 diabetes mellitus with diabetic polyneuropathy: Secondary | ICD-10-CM | POA: Diagnosis not present

## 2016-07-31 ENCOUNTER — Ambulatory Visit: Payer: Medicare Other | Admitting: Audiology

## 2016-07-31 LAB — HM DIABETES FOOT EXAM

## 2016-08-05 DIAGNOSIS — Z6830 Body mass index (BMI) 30.0-30.9, adult: Secondary | ICD-10-CM | POA: Diagnosis not present

## 2016-08-05 DIAGNOSIS — E039 Hypothyroidism, unspecified: Secondary | ICD-10-CM | POA: Diagnosis not present

## 2016-08-05 DIAGNOSIS — E782 Mixed hyperlipidemia: Secondary | ICD-10-CM | POA: Diagnosis not present

## 2016-08-05 DIAGNOSIS — E669 Obesity, unspecified: Secondary | ICD-10-CM | POA: Diagnosis not present

## 2016-08-05 DIAGNOSIS — N183 Chronic kidney disease, stage 3 (moderate): Secondary | ICD-10-CM | POA: Diagnosis not present

## 2016-08-05 DIAGNOSIS — E1165 Type 2 diabetes mellitus with hyperglycemia: Secondary | ICD-10-CM | POA: Diagnosis not present

## 2016-08-05 DIAGNOSIS — Z794 Long term (current) use of insulin: Secondary | ICD-10-CM | POA: Diagnosis not present

## 2016-08-05 DIAGNOSIS — E1142 Type 2 diabetes mellitus with diabetic polyneuropathy: Secondary | ICD-10-CM | POA: Diagnosis not present

## 2016-08-05 LAB — BASIC METABOLIC PANEL
BUN: 15 mg/dL (ref 4–21)
Creatinine: 1.3 mg/dL — AB (ref <=1.1)
Potassium: 4.6 mmol/L (ref 3.4–5.3)

## 2016-08-05 LAB — HEMOGLOBIN A1C: HEMOGLOBIN A1C: 8

## 2016-08-05 LAB — HEPATIC FUNCTION PANEL
ALK PHOS: 114 U/L (ref 25–125)
ALT: 12 U/L (ref 7–35)
AST: 17 U/L (ref 13–35)
BILIRUBIN, TOTAL: 0.6 mg/dL

## 2016-08-06 ENCOUNTER — Encounter: Payer: Self-pay | Admitting: Family Medicine

## 2016-08-06 DIAGNOSIS — H903 Sensorineural hearing loss, bilateral: Secondary | ICD-10-CM | POA: Diagnosis not present

## 2016-08-07 ENCOUNTER — Encounter: Payer: Self-pay | Admitting: Family Medicine

## 2016-08-16 ENCOUNTER — Encounter: Payer: Self-pay | Admitting: Family Medicine

## 2016-08-19 DIAGNOSIS — C50919 Malignant neoplasm of unspecified site of unspecified female breast: Secondary | ICD-10-CM | POA: Diagnosis not present

## 2016-09-30 ENCOUNTER — Ambulatory Visit: Payer: Medicare Other | Admitting: Audiology

## 2016-12-14 ENCOUNTER — Other Ambulatory Visit: Payer: Self-pay | Admitting: Family Medicine

## 2016-12-28 LAB — HM DIABETES EYE EXAM

## 2017-01-19 ENCOUNTER — Ambulatory Visit: Payer: Medicare Other

## 2017-01-21 ENCOUNTER — Other Ambulatory Visit: Payer: Self-pay | Admitting: Family Medicine

## 2017-01-21 DIAGNOSIS — E1122 Type 2 diabetes mellitus with diabetic chronic kidney disease: Secondary | ICD-10-CM

## 2017-01-21 DIAGNOSIS — N2581 Secondary hyperparathyroidism of renal origin: Secondary | ICD-10-CM

## 2017-01-21 DIAGNOSIS — N183 Chronic kidney disease, stage 3 unspecified: Secondary | ICD-10-CM

## 2017-01-21 DIAGNOSIS — E1165 Type 2 diabetes mellitus with hyperglycemia: Principal | ICD-10-CM

## 2017-01-21 DIAGNOSIS — E785 Hyperlipidemia, unspecified: Secondary | ICD-10-CM

## 2017-01-21 DIAGNOSIS — E039 Hypothyroidism, unspecified: Secondary | ICD-10-CM

## 2017-01-21 DIAGNOSIS — IMO0002 Reserved for concepts with insufficient information to code with codable children: Secondary | ICD-10-CM

## 2017-01-21 DIAGNOSIS — E1140 Type 2 diabetes mellitus with diabetic neuropathy, unspecified: Secondary | ICD-10-CM

## 2017-01-23 ENCOUNTER — Other Ambulatory Visit: Payer: Medicare Other

## 2017-01-23 ENCOUNTER — Ambulatory Visit: Payer: Medicare Other

## 2017-01-23 NOTE — Progress Notes (Deleted)
Subjective:   Julia Banks is a 76 y.o. female who presents for Medicare Annual (Subsequent) preventive examination.  Review of Systems:  No ROS.  Medicare Wellness Visit. Additional risk factors are reflected in the social history.        Objective:     Vitals: There were no vitals taken for this visit.  There is no height or weight on file to calculate BMI.   Tobacco History  Smoking Status  . Never Smoker  Smokeless Tobacco  . Never Used    Comment: second hand exposure     Counseling given: Not Answered   Past Medical History:  Diagnosis Date  . Arthritis   . Asthma   . Breast cancer, left breast ([place]) 02/2014   DCIS, ER+ PR- initially rec bilat mastectomy with sentinal LN biopsy L axilla but now planning just L mastectomy [name])  . Chronic kidney disease, stage 3   . Colon polyps    benign  . Complication of anesthesia    hard to wake up  . Coronary artery disease    30% stenosis reported on previous cardiac catheterization in 2006. This was done in [facility]  . GERD (gastroesophageal reflux disease)   . Headaches, cluster   . Hyperlipidemia   . Hypertension   . Hypothyroidism   . Osteopenia 07/2014   DEXA 0.5 spine, 01.7 R femoral neck  . Pneumonia    years ago  . Secondary hyperparathyroidism of renal origin ([place])    likely  . Sensorineural hearing loss (SNHL) of both ears 01/27/2016   Audiology eval - moderately severe sensorineural hearing loss bilaterally rec amplification (07/2016 - [name] audiologist)  . Sleep apnea    on CPAP  . Thyroid disease   . Type 2 diabetes, uncontrolled, with neuropathy ([place])    followed by endo Dr Buddy Duty   Past Surgical History:  Procedure Laterality Date  . ABDOMINAL HYSTERECTOMY     heavy bleeding, ovaries remain  . APPENDECTOMY    . BREAST BIOPSY Right    benign  . BREAST BIOPSY Left 02/2014   ?DCIS  . CARDIAC CATHETERIZATION  2006   [place], [place].   . CARDIAC  CATHETERIZATION  10/2013   mild 2v nonobstructive disease, EF 60%  . CARDIOVASCULAR STRESS TEST  09/2012   WNL ([place])  . CHOLECYSTECTOMY    . COLONOSCOPY  05/2010   2 TAs, diverticulosis, rpt 2 yrs (Dr Elissa Hefty in [place])  . COLONOSCOPY  12/2014   1 TA, rpt 5 yrs Henrene Pastor)  . cortisone right knee  01/04/15  . GALLBLADDER SURGERY    . MASTECTOMY W/ SENTINEL NODE BIOPSY Left 06/07/2014   DR WAKEFIELD  . SIMPLE MASTECTOMY WITH AXILLARY SENTINEL NODE BIOPSY Left 06/07/2014   Procedure: LEFT TOTAL MASTECTOMY WITH LEFT AXILLARY SENTINEL NODE BIOPSY;  Surgeon: Rolm Bookbinder, MD;  Location: [facility] OR;  Service: General;  Laterality: Left;   Family History  Problem Relation Age of Onset  . Arthritis Mother   . Hyperlipidemia Mother   . Stroke Mother   . Hypertension Mother   . Diabetes Mother   . Other Mother        parathyroid adenoma  . Arthritis Father   . Hyperlipidemia Father   . CAD Father        MI  . Hypertension Father   . Breast cancer Other   . Ovarian cancer Other   . Leukemia Brother  dx in his 87s  . Colon cancer Maternal Uncle        dx in his 34s  . Ovarian cancer Maternal Grandmother 90  . Melanoma Other 27       dx 2x - 28 and 29/dx 3x - ages 54, 11, and 5  . Cancer Neg Hx   . Crohn's disease Neg Hx   . Colon polyps Neg Hx   . Ulcerative colitis Neg Hx   . Stomach cancer Neg Hx   . Rectal cancer Neg Hx    History  Sexual Activity  . Sexual activity: Not on file    Outpatient Encounter Prescriptions as of 01/26/2017  Medication Sig  . albuterol (PROVENTIL HFA;VENTOLIN HFA) 108 (90 BASE) MCG/ACT inhaler Inhale into the lungs every 6 (six) hours as needed for wheezing or shortness of breath.  . Ascorbic Acid (VITAMIN C WITH ROSE HIPS) 500 MG tablet Take 500 mg by mouth daily.  . Cholecalciferol (VITAMIN D PO) Take 5,000 Units by mouth.  . docusate sodium (COLACE) 100 MG capsule Take 100 mg by mouth 2 (two) times daily.  Marland Kitchen doxycycline (VIBRAMYCIN) 100 MG  capsule Take 100 mg by mouth daily as needed (FOR FACE).   . fluconazole (DIFLUCAN) 150 MG tablet Take 150 mg by mouth daily as needed (TAKE AS NEEDED WHEN TAKING AN ANTIBIOTIC).   Marland Kitchen Fluticasone-Salmeterol (ADVAIR) 250-50 MCG/DOSE AEPB Inhale 1 puff into the lungs 2 (two) times daily.  . furosemide (LASIX) 20 MG tablet Take 0.5 tablets (10 mg total) by mouth daily. **MUST HAVE PHYSICAL FOR FURTHER REFILLS**  . Insulin Glargine (LANTUS SOLOSTAR) 100 UNIT/ML Solostar Pen Inject 45 Units into the skin daily with breakfast.  . levothyroxine (SYNTHROID, LEVOTHROID) 25 MCG tablet TAKE 1 TABLET BY MOUTH DAILY. EXCEPT 2 TABLETS ON MON, WED, AND FRI  . meloxicam (MOBIC) 7.5 MG tablet Take 7.5 mg by mouth daily. with food  . metoprolol succinate (TOPROL-XL) 50 MG 24 hr tablet Take 1 tablet (50 mg total) by mouth daily.  . niacin 50 MG tablet Take 50 mg by mouth at bedtime. No flush  . Omega-3 Fatty Acids (OMEGA 3 PO) Take by mouth. 180 EPA/120DHA  . omeprazole (PRILOSEC) 40 MG capsule TAKE 1 CAPSULE (40 MG TOTAL) BY MOUTH DAILY AS NEEDED.  Marland Kitchen OVER THE COUNTER MEDICATION Calcium magnesium 1000mg  day  . OVER THE COUNTER MEDICATION Women's multivitamin with iron  . potassium chloride SA (KLOR-CON M20) 20 MEQ tablet TAKE 1 TABLET (20 MEQ TOTAL) BY MOUTH DAILY.  Marland Kitchen temazepam (RESTORIL) 7.5 MG capsule Take 1 capsule (7.5 mg total) by mouth at bedtime as needed for sleep.  . vitamin B-12 (CYANOCOBALAMIN) 1000 MCG tablet Take 1,000 mcg by mouth daily.   No facility-administered encounter medications on file as of 01/26/2017.     Activities of Daily Living No flowsheet data found.  Patient Care Team: Ria Bush, MD as PCP - General (Family Medicine) Fanny Skates, MD as Consulting Physician (General Surgery) Nicholas Lose, MD as Consulting Physician (Hematology and Oncology) Arloa Koh, MD as Consulting Physician (Radiation Oncology)    Assessment:    Physical assessment deferred to  PCP.  Exercise Activities and Dietary recommendations    Goals    None     Fall Risk Fall Risk  01/25/2016 10/10/2014 06/20/2014 04/05/2014 06/28/2013  Falls in the past year? No No No Yes Yes  Number falls in past yr: - - - 1 1  Injury with Fall? - - - No Yes  Depression Screen PHQ 2/9 Scores 01/25/2016 10/10/2014 06/28/2013  PHQ - 2 Score 0 0 0     Cognitive Function PLEASE NOTE: A Mini-Cog screen was completed. Maximum score is 20. A value of 0 denotes this part of [place]stein MMSE was not completed or the patient failed this part of the Mini-Cog screening.   Mini-Cog Screening Orientation to Time - Max 5 pts Orientation to Place - Max 5 pts Registration - Max 3 pts Recall - Max 3 pts Language Repeat - Max 1 pts Language Follow 3 Step Command - Max 3 pts            Immunization History  Administered Date(s) Administered  . Influenza Split 09/16/2012  . Pneumococcal Conjugate-13 09/16/2004  . Pneumococcal Polysaccharide-23 09/16/2012   Screening Tests Health Maintenance  Topic Date Due  . DTaP/Tdap/Td (1 - Tdap) 02/16/1962  . TETANUS/TDAP  02/16/1962  . URINE MICROALBUMIN  12/03/2014  . OPHTHALMOLOGY EXAM  08/15/2015  . MAMMOGRAM  04/04/2016  . FOOT EXAM  10/24/2016  . INFLUENZA VACCINE  01/28/2017  . HEMOGLOBIN A1C  02/02/2017  . COLONOSCOPY  01/18/2020  . DEXA SCAN  Completed  . PNA vac Low Risk Adult  Completed      Plan:   Follow up with PCP as directed.  I have personally reviewed and noted the following in the patient's chart:   . Medical and social history . Use of alcohol, tobacco or illicit drugs  . Current medications and supplements . Functional ability and status . Nutritional status . Physical activity . Advanced directives . List of other physicians . Vitals . Screenings to include cognitive, depression, and falls . Referrals and appointments  In addition, I have reviewed and discussed with patient certain preventive protocols,  quality metrics, and best practice recommendations. A written personalized care plan for preventive services as well as general preventive health recommendations were provided to patient.     Ree Edman, RN  01/23/2017

## 2017-01-23 NOTE — Progress Notes (Deleted)
PCP notes:   Health maintenance:   Abnormal screenings:    Patient concerns:    Nurse concerns:   Next PCP appt:    

## 2017-01-26 ENCOUNTER — Other Ambulatory Visit: Payer: Self-pay | Admitting: Family Medicine

## 2017-01-26 ENCOUNTER — Ambulatory Visit (INDEPENDENT_AMBULATORY_CARE_PROVIDER_SITE_OTHER): Payer: Medicare Other | Admitting: Family Medicine

## 2017-01-26 ENCOUNTER — Encounter: Payer: Self-pay | Admitting: Family Medicine

## 2017-01-26 ENCOUNTER — Ambulatory Visit: Payer: Medicare Other

## 2017-01-26 VITALS — BP 150/82 | HR 70 | Temp 98.1°F | Ht 65.25 in | Wt 198.5 lb

## 2017-01-26 DIAGNOSIS — I10 Essential (primary) hypertension: Secondary | ICD-10-CM

## 2017-01-26 DIAGNOSIS — G4700 Insomnia, unspecified: Secondary | ICD-10-CM

## 2017-01-26 DIAGNOSIS — N183 Chronic kidney disease, stage 3 (moderate): Secondary | ICD-10-CM | POA: Diagnosis not present

## 2017-01-26 DIAGNOSIS — E1122 Type 2 diabetes mellitus with diabetic chronic kidney disease: Secondary | ICD-10-CM | POA: Diagnosis not present

## 2017-01-26 DIAGNOSIS — M8580 Other specified disorders of bone density and structure, unspecified site: Secondary | ICD-10-CM | POA: Diagnosis not present

## 2017-01-26 DIAGNOSIS — E039 Hypothyroidism, unspecified: Secondary | ICD-10-CM

## 2017-01-26 DIAGNOSIS — E1140 Type 2 diabetes mellitus with diabetic neuropathy, unspecified: Secondary | ICD-10-CM

## 2017-01-26 DIAGNOSIS — E1165 Type 2 diabetes mellitus with hyperglycemia: Secondary | ICD-10-CM | POA: Diagnosis not present

## 2017-01-26 DIAGNOSIS — R110 Nausea: Secondary | ICD-10-CM | POA: Diagnosis not present

## 2017-01-26 DIAGNOSIS — N2581 Secondary hyperparathyroidism of renal origin: Secondary | ICD-10-CM | POA: Diagnosis not present

## 2017-01-26 DIAGNOSIS — E785 Hyperlipidemia, unspecified: Secondary | ICD-10-CM

## 2017-01-26 DIAGNOSIS — Z0000 Encounter for general adult medical examination without abnormal findings: Secondary | ICD-10-CM | POA: Diagnosis not present

## 2017-01-26 DIAGNOSIS — IMO0002 Reserved for concepts with insufficient information to code with codable children: Secondary | ICD-10-CM

## 2017-01-26 DIAGNOSIS — I2510 Atherosclerotic heart disease of native coronary artery without angina pectoris: Secondary | ICD-10-CM | POA: Diagnosis not present

## 2017-01-26 DIAGNOSIS — Z7189 Other specified counseling: Secondary | ICD-10-CM | POA: Diagnosis not present

## 2017-01-26 DIAGNOSIS — G4733 Obstructive sleep apnea (adult) (pediatric): Secondary | ICD-10-CM

## 2017-01-26 DIAGNOSIS — K219 Gastro-esophageal reflux disease without esophagitis: Secondary | ICD-10-CM | POA: Diagnosis not present

## 2017-01-26 DIAGNOSIS — Z170 Estrogen receptor positive status [ER+]: Secondary | ICD-10-CM

## 2017-01-26 DIAGNOSIS — C50412 Malignant neoplasm of upper-outer quadrant of left female breast: Secondary | ICD-10-CM

## 2017-01-26 DIAGNOSIS — Z9989 Dependence on other enabling machines and devices: Secondary | ICD-10-CM

## 2017-01-26 LAB — LIPID PANEL
CHOL/HDL RATIO: 6
Cholesterol: 201 mg/dL — ABNORMAL HIGH (ref 0–200)
HDL: 33.2 mg/dL — ABNORMAL LOW (ref >39.00)
NONHDL: 168.29
TRIGLYCERIDES: 344 mg/dL — AB (ref 0.0–149.0)
VLDL: 68.8 mg/dL — ABNORMAL HIGH (ref 0.0–40.0)

## 2017-01-26 LAB — COMPREHENSIVE METABOLIC PANEL
ALT: 13 U/L (ref 0–35)
AST: 16 U/L (ref 0–37)
Albumin: 4 g/dL (ref 3.5–5.2)
Alkaline Phosphatase: 88 U/L (ref 39–117)
BUN: 14 mg/dL (ref 6–23)
CHLORIDE: 102 meq/L (ref 96–112)
CO2: 28 meq/L (ref 19–32)
CREATININE: 1.16 mg/dL (ref 0.40–1.20)
Calcium: 9.3 mg/dL (ref 8.4–10.5)
GFR: 48.55 mL/min — ABNORMAL LOW (ref >60.00)
GLUCOSE: 194 mg/dL — AB (ref 70–99)
Potassium: 4.1 mEq/L (ref 3.5–5.1)
SODIUM: 137 meq/L (ref 135–145)
Total Bilirubin: 0.6 mg/dL (ref 0.2–1.2)
Total Protein: 6.9 g/dL (ref 6.0–8.3)

## 2017-01-26 LAB — CBC WITH DIFFERENTIAL/PLATELET
BASOS PCT: 0.6 % (ref 0.0–3.0)
Basophils Absolute: 0 10*3/uL (ref 0.0–0.1)
Eosinophils Absolute: 0.4 10*3/uL (ref 0.0–0.7)
Eosinophils Relative: 4.4 % (ref 0.0–5.0)
HCT: 39.6 % (ref 36.0–46.0)
Hemoglobin: 13.4 g/dL (ref 12.0–15.0)
LYMPHS ABS: 2.1 10*3/uL (ref 0.7–4.0)
Lymphocytes Relative: 24 % (ref 12.0–46.0)
MCHC: 33.9 g/dL (ref 30.0–36.0)
MCV: 87.8 fl (ref 78.0–100.0)
MONO ABS: 0.6 10*3/uL (ref 0.1–1.0)
Monocytes Relative: 6.4 % (ref 3.0–12.0)
Neutro Abs: 5.6 10*3/uL (ref 1.4–7.7)
Neutrophils Relative %: 64.6 % (ref 43.0–77.0)
Platelets: 178 10*3/uL (ref 150.0–400.0)
RBC: 4.51 Mil/uL (ref 3.87–5.11)
RDW: 14.1 % (ref 11.5–15.5)
WBC: 8.6 10*3/uL (ref 4.0–10.5)

## 2017-01-26 LAB — HEMOGLOBIN A1C: Hgb A1c MFr Bld: 8.7 % — ABNORMAL HIGH (ref 4.6–6.5)

## 2017-01-26 LAB — LDL CHOLESTEROL, DIRECT: LDL DIRECT: 108 mg/dL

## 2017-01-26 LAB — TSH: TSH: 4.36 u[IU]/mL (ref 0.35–4.50)

## 2017-01-26 LAB — VITAMIN D 25 HYDROXY (VIT D DEFICIENCY, FRACTURES): VITD: 49.83 ng/mL (ref 30.00–100.00)

## 2017-01-26 MED ORDER — PANTOPRAZOLE SODIUM 40 MG PO TBEC: 40.0000 mg | DELAYED_RELEASE_TABLET | Freq: Every day | ORAL | 6 refills | Status: DC

## 2017-01-26 MED ORDER — PRAVASTATIN SODIUM 20 MG PO TABS: 20.0000 mg | ORAL_TABLET | ORAL | 1 refills | Status: DC

## 2017-01-26 MED ORDER — LOSARTAN POTASSIUM 25 MG PO TABS: 25.0000 mg | ORAL_TABLET | Freq: Every day | ORAL | 6 refills | Status: DC

## 2017-01-26 NOTE — Assessment & Plan Note (Signed)
Off risperdal - overly sedating.

## 2017-01-26 NOTE — Assessment & Plan Note (Signed)

## 2017-01-26 NOTE — Assessment & Plan Note (Signed)
Ongoing GERD, indigestion with nausea. Will stop omeprazole and trial dexilant. RTC 2 mo f/u visit.

## 2017-01-26 NOTE — Assessment & Plan Note (Addendum)
Update EKG. Last catheterization 10/2013 - mild 30% blockage.  Reviewed medical management. Will recommend statin pending FLP results.

## 2017-01-26 NOTE — Assessment & Plan Note (Signed)
Discharged from Dr Lindi Adie, continues regularly seeing Dr [name].

## 2017-01-26 NOTE — Assessment & Plan Note (Signed)
Followed by endo. Update A1c. Continue lantus. She will call to schedule appt with Dr Buddy Duty

## 2017-01-26 NOTE — Assessment & Plan Note (Signed)
Continues CPAP regularly.  

## 2017-01-26 NOTE — Patient Instructions (Addendum)
Bring me copy of your advanced directive to update your chart.  Labs today. EKG today.  Stop niacin.  Stop omeprazole. Start dexilant for heartburn - sent to pharmacy. Return in 2 months for follow up visit.  Ok to cancel 1pm appt.   Health Maintenance, Female Adopting a healthy lifestyle and getting preventive care can go a long way to promote health and wellness. Talk with your health care provider about what schedule of regular examinations is right for you. This is a good chance for you to check in with your provider about disease prevention and staying healthy. In between checkups, there are plenty of things you can do on your own. Experts have done a lot of research about which lifestyle changes and preventive measures are most likely to keep you healthy. Ask your health care provider for more information. Weight and diet Eat a healthy diet  Be sure to include plenty of vegetables, fruits, low-fat dairy products, and lean protein.  Do not eat a lot of foods high in solid fats, added sugars, or salt.  Get regular exercise. This is one of the most important things you can do for your health. ? Most adults should exercise for at least 150 minutes each week. The exercise should increase your heart rate and make you sweat (moderate-intensity exercise). ? Most adults should also do strengthening exercises at least twice a week. This is in addition to the moderate-intensity exercise.  Maintain a healthy weight  Body mass index (BMI) is a measurement that can be used to identify possible weight problems. It estimates body fat based on height and weight. Your health care provider can help determine your BMI and help you achieve or maintain a healthy weight.  For females 8 years of age and older: ? A BMI below 18.5 is considered underweight. ? A BMI of 18.5 to 24.9 is normal. ? A BMI of 25 to 29.9 is considered overweight. ? A BMI of 30 and above is considered obese.  Watch levels of  cholesterol and blood lipids  You should start having your blood tested for lipids and cholesterol at 74 years of age, then have this test every 5 years.  You may need to have your cholesterol levels checked more often if: ? Your lipid or cholesterol levels are high. ? You are older than 74 years of age. ? You are at high risk for heart disease.  Cancer screening Lung Cancer  Lung cancer screening is recommended for adults 50-60 years old who are at high risk for lung cancer because of a history of smoking.  A yearly low-dose CT scan of the lungs is recommended for people who: ? Currently smoke. ? Have quit within the past 15 years. ? Have at least a 30-pack-year history of smoking. A pack year is smoking an average of one pack of cigarettes a day for 1 year.  Yearly screening should continue until it has been 15 years since you quit.  Yearly screening should stop if you develop a health problem that would prevent you from having lung cancer treatment.  Breast Cancer  Practice breast self-awareness. This means understanding how your breasts normally appear and feel.  It also means doing regular breast self-exams. Let your health care provider know about any changes, no matter how small.  If you are in your 20s or 30s, you should have a clinical breast exam (CBE) by a health care provider every 1-3 years as part of a regular health exam.  If you are 40 or older, have a CBE every year. Also consider having a breast X-ray (mammogram) every year.  If you have a family history of breast cancer, talk to your health care provider about genetic screening.  If you are at high risk for breast cancer, talk to your health care provider about having an MRI and a mammogram every year.  Breast cancer gene (BRCA) assessment is recommended for women who have family members with BRCA-related cancers. BRCA-related cancers include: ? Breast. ? Ovarian. ? Tubal. ? Peritoneal cancers.  Results  of the assessment will determine the need for genetic counseling and BRCA1 and BRCA2 testing.  Cervical Cancer Your health care provider may recommend that you be screened regularly for cancer of the pelvic organs (ovaries, uterus, and vagina). This screening involves a pelvic examination, including checking for microscopic changes to the surface of your cervix (Pap test). You may be encouraged to have this screening done every 3 years, beginning at age 21.  For women ages 30-65, health care providers may recommend pelvic exams and Pap testing every 3 years, or they may recommend the Pap and pelvic exam, combined with testing for human papilloma virus (HPV), every 5 years. Some types of HPV increase your risk of cervical cancer. Testing for HPV may also be done on women of any age with unclear Pap test results.  Other health care providers may not recommend any screening for nonpregnant women who are considered low risk for pelvic cancer and who do not have symptoms. Ask your health care provider if a screening pelvic exam is right for you.  If you have had past treatment for cervical cancer or a condition that could lead to cancer, you need Pap tests and screening for cancer for at least 20 years after your treatment. If Pap tests have been discontinued, your risk factors (such as having a new sexual partner) need to be reassessed to determine if screening should resume. Some women have medical problems that increase the chance of getting cervical cancer. In these cases, your health care provider may recommend more frequent screening and Pap tests.  Colorectal Cancer  This type of cancer can be detected and often prevented.  Routine colorectal cancer screening usually begins at 74 years of age and continues through 75 years of age.  Your health care provider may recommend screening at an earlier age if you have risk factors for colon cancer.  Your health care provider may also recommend using  home test kits to check for hidden blood in the stool.  A small camera at the end of a tube can be used to examine your colon directly (sigmoidoscopy or colonoscopy). This is done to check for the earliest forms of colorectal cancer.  Routine screening usually begins at age 50.  Direct examination of the colon should be repeated every 5-10 years through 75 years of age. However, you may need to be screened more often if early forms of precancerous polyps or small growths are found.  Skin Cancer  Check your skin from head to toe regularly.  Tell your health care provider about any new moles or changes in moles, especially if there is a change in a mole's shape or color.  Also tell your health care provider if you have a mole that is larger than the size of a pencil eraser.  Always use sunscreen. Apply sunscreen liberally and repeatedly throughout the day.  Protect yourself by wearing long sleeves, pants, a wide-brimmed hat, and   sunglasses whenever you are outside.  Heart disease, diabetes, and high blood pressure  High blood pressure causes heart disease and increases the risk of stroke. High blood pressure is more likely to develop in: ? People who have blood pressure in the high end of the normal range (130-139/85-89 mm Hg). ? People who are overweight or obese. ? People who are African American.  If you are 44-32 years of age, have your blood pressure checked every 3-5 years. If you are 62 years of age or older, have your blood pressure checked every year. You should have your blood pressure measured twice-once when you are at a hospital or clinic, and once when you are not at a hospital or clinic. Record the average of the two measurements. To check your blood pressure when you are not at a hospital or clinic, you can use: ? An automated blood pressure machine at a pharmacy. ? A home blood pressure monitor.  If you are between 71 years and 6 years old, ask your health care provider  if you should take aspirin to prevent strokes.  Have regular diabetes screenings. This involves taking a blood sample to check your fasting blood sugar level. ? If you are at a normal weight and have a low risk for diabetes, have this test once every three years after 74 years of age. ? If you are overweight and have a high risk for diabetes, consider being tested at a younger age or more often. Preventing infection Hepatitis B  If you have a higher risk for hepatitis B, you should be screened for this virus. You are considered at high risk for hepatitis B if: ? You were born in a country where hepatitis B is common. Ask your health care provider which countries are considered high risk. ? Your parents were born in a high-risk country, and you have not been immunized against hepatitis B (hepatitis B vaccine). ? You have HIV or AIDS. ? You use needles to inject street drugs. ? You live with someone who has hepatitis B. ? You have had sex with someone who has hepatitis B. ? You get hemodialysis treatment. ? You take certain medicines for conditions, including cancer, organ transplantation, and autoimmune conditions.  Hepatitis C  Blood testing is recommended for: ? Everyone born from [birth year] through 1963. ? Anyone with known risk factors for hepatitis C.  Sexually transmitted infections (STIs)  You should be screened for sexually transmitted infections (STIs) including gonorrhea and chlamydia if: ? You are sexually active and are younger than 74 years of age. ? You are older than 74 years of age and your health care provider tells you that you are at risk for this type of infection. ? Your sexual activity has changed since you were last screened and you are at an increased risk for chlamydia or gonorrhea. Ask your health care provider if you are at risk.  If you do not have HIV, but are at risk, it may be recommended that you take a prescription medicine daily to prevent HIV infection. This  is called pre-exposure prophylaxis (PrEP). You are considered at risk if: ? You are sexually active and do not regularly use condoms or know the HIV status of your partner(s). ? You take drugs by injection. ? You are sexually active with a partner who has HIV.  Talk with your health care provider about whether you are at high risk of being infected with HIV. If you choose to begin PrEP, you  should first be tested for HIV. You should then be tested every 3 months for as long as you are taking PrEP. Pregnancy  If you are premenopausal and you may become pregnant, ask your health care provider about preconception counseling.  If you may become pregnant, take 400 to 800 micrograms (mcg) of folic acid every day.  If you want to prevent pregnancy, talk to your health care provider about birth control (contraception). Osteoporosis and menopause  Osteoporosis is a disease in which the bones lose minerals and strength with aging. This can result in serious bone fractures. Your risk for osteoporosis can be identified using a bone density scan.  If you are 73 years of age or older, or if you are at risk for osteoporosis and fractures, ask your health care provider if you should be screened.  Ask your health care provider whether you should take a calcium or vitamin D supplement to lower your risk for osteoporosis.  Menopause may have certain physical symptoms and risks.  Hormone replacement therapy may reduce some of these symptoms and risks. Talk to your health care provider about whether hormone replacement therapy is right for you. Follow these instructions at home:  Schedule regular health, dental, and eye exams.  Stay current with your immunizations.  Do not use any tobacco products including cigarettes, chewing tobacco, or electronic cigarettes.  If you are pregnant, do not drink alcohol.  If you are breastfeeding, limit how much and how often you drink alcohol.  Limit alcohol intake  to no more than 1 drink per day for nonpregnant women. One drink equals 12 ounces of beer, 5 ounces of wine, or 1 ounces of hard liquor.  Do not use street drugs.  Do not share needles.  Ask your health care provider for help if you need support or information about quitting drugs.  Tell your health care provider if you often feel depressed.  Tell your health care provider if you have ever been abused or do not feel safe at home. This information is not intended to replace advice given to you by your health care provider. Make sure you discuss any questions you have with your health care provider. Document Released: 12/30/2010 Document Revised: 11/22/2015 Document Reviewed: 03/20/2015 Elsevier Interactive Patient Education  [employer].

## 2017-01-26 NOTE — Assessment & Plan Note (Signed)
Chronic, stable on levothyroxine 61mcg daily with 71mcg MWF. Update TSH.

## 2017-01-26 NOTE — Assessment & Plan Note (Signed)
Through GYN office.

## 2017-01-26 NOTE — Assessment & Plan Note (Signed)
New over the past month. Encouraged she take medications with food. Update labs.

## 2017-01-26 NOTE — Assessment & Plan Note (Signed)
Chronic, again elevated today. Will add losartan 25mg  daily. RTC 2 mo recheck.

## 2017-01-26 NOTE — Assessment & Plan Note (Addendum)
On fish oil, niacin. I asked her to discontinue niacin (was taking flush-free). She has been intolerant to statins in the past (crestor, lipitor, pravastatin, lovastatin) and is hesitant for retrial. Update FLP today.  The 10-year ASCVD risk score Mikey Bussing DC Brooke Bonito., et al., 2013) is: 40.6%   Values used to calculate the score:     Age: 76 years     Sex: Female     Is Non-Hispanic African American: No     Diabetic: Yes     Tobacco smoker: No     Systolic Blood Pressure: 425 mmHg     Is BP treated: Yes     HDL Cholesterol: 33.2 mg/dL     Total Cholesterol: 201 mg/dL

## 2017-01-26 NOTE — Assessment & Plan Note (Signed)
Advanced directives: has this at home. HC proxy would be niece [name]. Aasked to bring me copy.  

## 2017-01-26 NOTE — Progress Notes (Signed)
BP (!) 150/82   Pulse 70   Temp 98.1 F (36.7 C) (Oral)   Ht 5' 5.25" (1.657 m)   Wt 198 lb 8 oz (90 kg)   SpO2 97%   BMI 32.78 kg/m    CC: medicare wellness - later this morning. Here for f/u.  Subjective:    Patient ID: Julia Banks, female    DOB: Oct 27, 1940, 76 y.o.   MRN: [id number]  HPI: Julia Banks is a 76 y.o. female presenting on 01/26/2017 for Annual Exam   Here with niece [name]  DM - regularly does check sugars fasting - 162. Followed by endo Dr Buddy Duty - upcoming appt. Compliant with antihyperglycemic regimen which includes: lantus 45u daily. Denies low sugars or hypoglycemic symptoms. Denies paresthesias. Last diabetic eye exam - upcoming. Pneumovax: 2014.  Prevnar: 2006.  Lab Results  Component Value Date   HGBA1C 8.7 (H) 01/26/2017   Diabetic Foot Exam - Simple   Simple Foot Form Diabetic Foot exam was performed with the following findings:  Yes 01/26/2017 10:26 AM  Visual Inspection No deformities, no ulcerations, no other skin breakdown bilaterally:  Yes Sensation Testing Intact to touch and monofilament testing bilaterally:  Yes Pulse Check Posterior Tibialis and Dorsalis pulse intact bilaterally:  Yes Comments      BP elevated today - brings log with home readings 140-160/70-80s. No HA, vision changes, CP/tightness, SOB, leg swelling. Occasional sharp chest pains and L arm pain. Has seen Dr Fletcher Anon - 30% blockage by cath 2015. Intermittent paresthesias of arms. Compliant with lasix 1/2 tab QD prn, toprol xl 50mg  daily.   OSA on CPAP.  Compliant with levothyroxine per med list.  HLD - intolerant to statins in the past (pravastatin, crestor, lipitor). Will likely rec MWF trial.  Worsening nausea this past month.   Hearing - passed at at [employer] - yearly - upcoming next month No falls in past year.  Denies depression  Preventative: COLONOSCOPY 12/2014 1 TA, rpt 5 yrs Henrene Pastor)  Well woman - s/p hysterectomy for benign reason.Last well  woman exam with Dr. Helane Rima 03/2015. Seen yearly.  Breast cancer - s/p treatment 2015 - released from care from Dr Lindi Adie. Unable to tolerate anastrazole, sees Dr [name] regularly. Yearly mammo 03/2016 DEXA - through Dr Christen Butter office. ?osteopenia  Flu shot - declines  Tetanus - unsure prevnar 2006, pneumovax 2014 Shingles shot - not currently interested due to cost. Discussed shingrix Advanced directives: has this at home. HC proxy would be niece [name] Flinchum. Aasked to bring me copy  Seat belt use discussed Sunscreen use discussed. No changing moles on skin. Sees derm. Non smoker Alcohol - none  Widow, husband Fritz Pickerel passed 2015. Has 3 dogs  No children. 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 Interim medical history since our last visit reviewed. Allergies and medications reviewed and updated. Outpatient Medications Prior to Visit  Medication Sig Dispense Refill  . albuterol (PROVENTIL HFA;VENTOLIN HFA) 108 (90 BASE) MCG/ACT inhaler Inhale into the lungs every 6 (six) hours as needed for wheezing or shortness of breath.    . Ascorbic Acid (VITAMIN C WITH ROSE HIPS) 500 MG tablet Take 500 mg by mouth daily.    . Cholecalciferol (VITAMIN D PO) Take 5,000 Units by mouth.    Marland Kitchen  docusate sodium (COLACE) 100 MG capsule Take 100 mg by mouth 2 (two) times daily.    Marland Kitchen doxycycline (VIBRAMYCIN) 100 MG capsule Take 100 mg by mouth daily as needed (FOR FACE).     . Fluticasone-Salmeterol (ADVAIR) 250-50 MCG/DOSE AEPB Inhale 1 puff into the lungs 2 (two) times daily.    . furosemide (LASIX) 20 MG tablet Take 0.5 tablets (10 mg total) by mouth daily. **MUST HAVE PHYSICAL FOR FURTHER REFILLS** 45 tablet 3  . Insulin Glargine  (LANTUS SOLOSTAR) 100 UNIT/ML Solostar Pen Inject 45 Units into the skin daily with breakfast.    . levothyroxine (SYNTHROID, LEVOTHROID) 25 MCG tablet TAKE 1 TABLET BY MOUTH DAILY. EXCEPT 2 TABLETS ON MON, WED, AND FRI 130 tablet 1  . metoprolol succinate (TOPROL-XL) 50 MG 24 hr tablet Take 1 tablet (50 mg total) by mouth daily. 90 tablet 3  . Omega-3 Fatty Acids (OMEGA 3 PO) Take by mouth. 180 EPA/120DHA    . OVER THE COUNTER MEDICATION Calcium magnesium 1000mg  day    . OVER THE COUNTER MEDICATION Women's multivitamin with iron    . potassium chloride SA (KLOR-CON M20) 20 MEQ tablet TAKE 1 TABLET (20 MEQ TOTAL) BY MOUTH DAILY. 90 tablet 3  . vitamin B-12 (CYANOCOBALAMIN) 1000 MCG tablet Take 1,000 mcg by mouth daily.    . fluconazole (DIFLUCAN) 150 MG tablet Take 150 mg by mouth daily as needed (TAKE AS NEEDED WHEN TAKING AN ANTIBIOTIC).   6  . meloxicam (MOBIC) 7.5 MG tablet Take 7.5 mg by mouth daily. with food  1  . niacin 50 MG tablet Take 50 mg by mouth at bedtime. No flush    . omeprazole (PRILOSEC) 40 MG capsule TAKE 1 CAPSULE (40 MG TOTAL) BY MOUTH DAILY AS NEEDED. 90 capsule 1  . temazepam (RESTORIL) 7.5 MG capsule Take 1 capsule (7.5 mg total) by mouth at bedtime as needed for sleep. (Patient not taking: Reported on 01/26/2017) 30 capsule 0   No facility-administered medications prior to visit.      Per HPI unless specifically indicated in ROS section below Review of Systems     Objective:    BP (!) 150/82   Pulse 70   Temp 98.1 F (36.7 C) (Oral)   Ht 5' 5.25" (1.657 m)   Wt 198 lb 8 oz (90 kg)   SpO2 97%   BMI 32.78 kg/m   Wt Readings from Last 3 Encounters:  01/26/17 198 lb 8 oz (90 kg)  01/25/16 185 lb 12 oz (84.3 kg)  07/17/15 190 lb 8 oz (86.4 kg)    Physical Exam  Constitutional: She is oriented to person, place, and time. She appears well-developed and well-nourished. No distress.  HENT:  Head: Normocephalic and atraumatic.  Right Ear: Tympanic membrane,  external ear and ear canal normal. Decreased hearing is noted.  Left Ear: Tympanic membrane, external ear and ear canal normal. Decreased hearing is noted.  Nose: Nose normal.  Mouth/Throat: Uvula is midline, oropharynx is clear and moist and mucous membranes are normal. No oropharyngeal exudate, posterior oropharyngeal edema or posterior oropharyngeal erythema.  Hearing aides in place  Eyes: Pupils are equal, round, and reactive to light. Conjunctivae and EOM are normal. No scleral icterus.  Neck: Normal range of motion. Neck supple. Carotid bruit is not present. No thyromegaly present.  Cardiovascular: Normal rate, regular rhythm, normal heart sounds and intact distal pulses.   No murmur heard. Pulses:      Radial pulses are 2+ on the  right side, and 2+ on the left side.  Pulmonary/Chest: Effort normal and breath sounds normal. No respiratory distress. She has no wheezes. She has no rales.  Abdominal: Soft. Bowel sounds are normal. She exhibits no distension and no mass. There is no tenderness. There is no rebound and no guarding.  Musculoskeletal: Normal range of motion. She exhibits no edema.  See HPI for foot exam if done  Lymphadenopathy:    She has no cervical adenopathy.  Neurological: She is alert and oriented to person, place, and time.  CN grossly intact, station and gait intact Recall 3/3 Calculation 5/5 D-L-[id number]-W  Skin: Skin is warm and dry. No rash noted.  Psychiatric: She has a normal mood and affect. Her behavior is normal. Judgment and thought content normal.  Nursing note and vitals reviewed.  Results for orders placed or performed in visit on 01/26/17  HM MAMMOGRAPHY  Result Value Ref Range   HM Mammogram Self Reported Normal 0-4 Bi-Rad, Self Reported Normal  Lipid panel  Result Value Ref Range   Cholesterol 201 (H) 0 - 200 mg/dL   Triglycerides 344.0 (H) 0.0 - 149.0 mg/dL   HDL 33.20 (L) >39.00 mg/dL   VLDL 68.8 (H) 0.0 - 40.0 mg/dL   Total CHOL/HDL Ratio 6     NonHDL 168.29   TSH  Result Value Ref Range   TSH 4.36 0.35 - 4.50 uIU/mL  Comprehensive metabolic panel  Result Value Ref Range   Sodium 137 135 - 145 mEq/L   Potassium 4.1 3.5 - 5.1 mEq/L   Chloride 102 96 - 112 mEq/L   CO2 28 19 - 32 mEq/L   Glucose, Bld 194 (H) 70 - 99 mg/dL   BUN 14 6 - 23 mg/dL   Creatinine, Ser 1.16 0.40 - 1.20 mg/dL   Total Bilirubin 0.6 0.2 - 1.2 mg/dL   Alkaline Phosphatase 88 39 - 117 U/L   AST 16 0 - 37 U/L   ALT 13 0 - 35 U/L   Total Protein 6.9 6.0 - 8.3 g/dL   Albumin 4.0 3.5 - 5.2 g/dL   Calcium 9.3 8.4 - 10.5 mg/dL   GFR 48.55 (L) >60.00 mL/min  Hemoglobin A1c  Result Value Ref Range   Hgb A1c MFr Bld 8.7 (H) 4.6 - 6.5 %  CBC with Differential/Platelet  Result Value Ref Range   WBC 8.6 4.0 - 10.5 K/uL   RBC 4.51 3.87 - 5.11 Mil/uL   Hemoglobin 13.4 12.0 - 15.0 g/dL   HCT 39.6 36.0 - 46.0 %   MCV 87.8 78.0 - 100.0 fl   MCHC 33.9 30.0 - 36.0 g/dL   RDW 14.1 11.5 - 15.5 %   Platelets 178.0 150.0 - 400.0 K/uL   Neutrophils Relative % 64.6 43.0 - 77.0 %   Lymphocytes Relative 24.0 12.0 - 46.0 %   Monocytes Relative 6.4 3.0 - 12.0 %   Eosinophils Relative 4.4 0.0 - 5.0 %   Basophils Relative 0.6 0.0 - 3.0 %   Neutro Abs 5.6 1.4 - 7.7 K/uL   Lymphs Abs 2.1 0.7 - 4.0 K/uL   Monocytes Absolute 0.6 0.1 - 1.0 K/uL   Eosinophils Absolute 0.4 0.0 - 0.7 K/uL   Basophils Absolute 0.0 0.0 - 0.1 K/uL  VITAMIN D 25 Hydroxy (Vit-D Deficiency, Fractures)  Result Value Ref Range   VITD 49.83 30.00 - 100.00 ng/mL  LDL cholesterol, direct  Result Value Ref Range   Direct LDL 108.0 mg/dL  HM DIABETES FOOT EXAM  Result Value Ref Range   HM Diabetic Foot Exam at endo   HM DIABETES EYE EXAM  Result Value Ref Range   HM Diabetic Eye Exam No Retinopathy No Retinopathy   EKG - NSR rate 60s, normal axis, intervals, no acute ST/T changes, nonspecific T wave flattening/inversion septally.     Assessment & Plan:   Problem List Items Addressed This Visit      Advanced care planning/counseling discussion    Advanced directives: has this at home. HC proxy would be niece [name] Flinchum. Aasked to bring me copy       Breast cancer of upper-outer quadrant of left female breast [facility])    Discharged from Dr Lindi Adie, continues regularly seeing Dr [name].      Coronary artery disease    Update EKG. Last catheterization 10/2013 - mild 30% blockage.  Reviewed medical management. Will recommend statin pending FLP results.       Relevant Medications   losartan (COZAAR) 25 MG tablet   Other Relevant Orders   EKG 12-Lead (Completed)   Diabetes mellitus with stage 3 chronic kidney disease ([place])    Followed by endo. Update A1c. Continue lantus. She will call to schedule appt with Dr Buddy Duty      Relevant Medications   losartan (COZAAR) 25 MG tablet   Essential hypertension, benign    Chronic, again elevated today. Will add losartan 25mg  daily. RTC 2 mo recheck.      Relevant Medications   losartan (COZAAR) 25 MG tablet   GERD (gastroesophageal reflux disease)    Ongoing GERD, indigestion with nausea. Will stop omeprazole and trial dexilant. RTC 2 mo f/u visit.       Relevant Medications   pantoprazole (PROTONIX) 40 MG tablet   HLD (hyperlipidemia)    On fish oil, niacin. I asked her to discontinue niacin (was taking flush-free). She has been intolerant to statins in the past (crestor, lipitor, pravastatin, lovastatin) and is hesitant for retrial. Update FLP today.  The 10-year ASCVD risk score Mikey Bussing DC Brooke Bonito., et al., 2013) is: 40.6%   Values used to calculate the score:     Age: 39 years     Sex: Female     Is Non-Hispanic African American: No     Diabetic: Yes     Tobacco smoker: No     Systolic Blood Pressure: 389 mmHg     Is BP treated: Yes     HDL Cholesterol: 33.2 mg/dL     Total Cholesterol: 201 mg/dL       Relevant Medications   losartan (COZAAR) 25 MG tablet   Hypothyroidism    Chronic, stable on levothyroxine 52mcg daily  with 16mcg MWF. Update TSH.       Insomnia    Off risperdal - overly sedating.      Medicare annual wellness visit, subsequent - Primary    I have personally reviewed the Medicare Annual Wellness questionnaire and have noted 1. The patient's medical and social history 2. Their use of alcohol, tobacco or illicit drugs 3. Their current medications and supplements 4. The patient's functional ability including ADL's, fall risks, home safety risks and hearing or visual impairment. Cognitive function has been assessed and addressed as indicated.  5. Diet and physical activity 6. Evidence for depression or mood disorders The patients weight, height, BMI have been recorded in the chart. I have made referrals, counseling and provided education to the patient based on review of the above and I have provided the pt  with a written personalized care plan for preventive services. Provider list updated.. See scanned questionairre as needed for further documentation. Reviewed preventative protocols and updated unless pt declined.       Nausea    New over the past month. Encouraged she take medications with food. Update labs.      OSA on CPAP    Continues CPAP regularly.       Osteopenia    Through GYN office.      Secondary hyperparathyroidism of renal origin ([place])   Type 2 diabetes, uncontrolled, with neuropathy ([place])   Relevant Medications   losartan (COZAAR) 25 MG tablet       Follow up plan: Return in about 2 months (around 03/29/2017) for follow up visit.  Ria Bush, MD

## 2017-01-26 NOTE — Assessment & Plan Note (Deleted)
Followed by endo. Update A1c. Continue lantus. She will call to schedule appt with Dr Buddy Duty

## 2017-01-27 LAB — PARATHYROID HORMONE, INTACT (NO CA): PTH: 57 pg/mL (ref 14–64)

## 2017-02-03 ENCOUNTER — Other Ambulatory Visit: Payer: Self-pay

## 2017-02-03 ENCOUNTER — Telehealth: Payer: Self-pay | Admitting: Family Medicine

## 2017-02-03 MED ORDER — LOSARTAN POTASSIUM 25 MG PO TABS: 25.0000 mg | ORAL_TABLET | Freq: Every day | ORAL | 3 refills | Status: DC

## 2017-02-03 NOTE — Telephone Encounter (Signed)
Received a paper 90 day supply request for patient's Losartan Potassium 25mg . Pt was recently seen 01/26/17. Rx was sent to patient's pharmacy.  Nothing further is needed

## 2017-02-05 DIAGNOSIS — C50919 Malignant neoplasm of unspecified site of unspecified female breast: Secondary | ICD-10-CM | POA: Diagnosis not present

## 2017-02-09 DIAGNOSIS — L821 Other seborrheic keratosis: Secondary | ICD-10-CM | POA: Diagnosis not present

## 2017-02-09 DIAGNOSIS — Z86018 Personal history of other benign neoplasm: Secondary | ICD-10-CM | POA: Diagnosis not present

## 2017-02-09 DIAGNOSIS — Z872 Personal history of diseases of the skin and subcutaneous tissue: Secondary | ICD-10-CM | POA: Diagnosis not present

## 2017-02-09 DIAGNOSIS — L570 Actinic keratosis: Secondary | ICD-10-CM | POA: Diagnosis not present

## 2017-02-09 DIAGNOSIS — L218 Other seborrheic dermatitis: Secondary | ICD-10-CM | POA: Diagnosis not present

## 2017-02-23 DIAGNOSIS — H52223 Regular astigmatism, bilateral: Secondary | ICD-10-CM | POA: Diagnosis not present

## 2017-02-23 DIAGNOSIS — H524 Presbyopia: Secondary | ICD-10-CM | POA: Diagnosis not present

## 2017-02-23 DIAGNOSIS — H5203 Hypermetropia, bilateral: Secondary | ICD-10-CM | POA: Diagnosis not present

## 2017-02-23 DIAGNOSIS — E119 Type 2 diabetes mellitus without complications: Secondary | ICD-10-CM | POA: Diagnosis not present

## 2017-03-01 ENCOUNTER — Encounter: Payer: Self-pay | Admitting: Family Medicine

## 2017-03-01 ENCOUNTER — Other Ambulatory Visit: Payer: Self-pay | Admitting: Family Medicine

## 2017-03-02 ENCOUNTER — Other Ambulatory Visit: Payer: Self-pay | Admitting: Family Medicine

## 2017-03-25 NOTE — Telephone Encounter (Signed)
error 

## 2017-03-25 NOTE — Telephone Encounter (Signed)
See care note  

## 2017-03-30 ENCOUNTER — Ambulatory Visit (INDEPENDENT_AMBULATORY_CARE_PROVIDER_SITE_OTHER): Payer: Medicare Other | Admitting: Family Medicine

## 2017-03-30 ENCOUNTER — Encounter: Payer: Self-pay | Admitting: Family Medicine

## 2017-03-30 VITALS — BP 170/70 | HR 66 | Temp 97.9°F | Wt 199.2 lb

## 2017-03-30 DIAGNOSIS — I10 Essential (primary) hypertension: Secondary | ICD-10-CM

## 2017-03-30 DIAGNOSIS — E785 Hyperlipidemia, unspecified: Secondary | ICD-10-CM

## 2017-03-30 DIAGNOSIS — R110 Nausea: Secondary | ICD-10-CM | POA: Diagnosis not present

## 2017-03-30 DIAGNOSIS — I2510 Atherosclerotic heart disease of native coronary artery without angina pectoris: Secondary | ICD-10-CM

## 2017-03-30 LAB — LDL CHOLESTEROL, DIRECT: Direct LDL: 112 mg/dL

## 2017-03-30 LAB — BASIC METABOLIC PANEL
BUN: 16 mg/dL (ref 6–23)
CO2: 28 mEq/L (ref 19–32)
CREATININE: 1.19 mg/dL (ref 0.40–1.20)
Calcium: 9.5 mg/dL (ref 8.4–10.5)
Chloride: 101 mEq/L (ref 96–112)
GFR: 47.11 mL/min — AB (ref >60.00)
Glucose, Bld: 207 mg/dL — ABNORMAL HIGH (ref 70–99)
POTASSIUM: 4.4 meq/L (ref 3.5–5.1)
Sodium: 138 mEq/L (ref 135–145)

## 2017-03-30 MED ORDER — FUROSEMIDE 20 MG PO TABS: 10.0000 mg | ORAL_TABLET | Freq: Every day | ORAL | 3 refills | Status: DC | PRN

## 2017-03-30 MED ORDER — PRAVASTATIN SODIUM 20 MG PO TABS: 20.0000 mg | ORAL_TABLET | Freq: Every day | ORAL | 1 refills | Status: DC

## 2017-03-30 MED ORDER — LOSARTAN POTASSIUM 50 MG PO TABS: 50.0000 mg | ORAL_TABLET | Freq: Every day | ORAL | 1 refills | Status: DC

## 2017-03-30 NOTE — Assessment & Plan Note (Signed)
Reports taking pravastatin daily. Continue. Update dLDL today.

## 2017-03-30 NOTE — Assessment & Plan Note (Signed)
Ongoing. Will trial zofran.

## 2017-03-30 NOTE — Assessment & Plan Note (Signed)
Chronic, uncontrolled. Increase losartan to 50mg  daily. Check Cr today. RTC 2-3 mo f/u visit.

## 2017-03-30 NOTE — Progress Notes (Signed)
BP (!) 170/70 (BP Location: Right Arm, Cuff Size: Large)   Pulse 66   Temp 97.9 F (36.6 C) (Oral)   Wt 199 lb 4 oz (90.4 kg)   SpO2 97%   BMI 32.90 kg/m    CC: 3 mo /fu visit Subjective:    Patient ID: Julia Banks, female    DOB: [birth year], 76 y.o.   MRN: [id number]  HPI: Julia Banks is a 76 y.o. female presenting on 03/30/2017 for 2 mo follow-up   HTN - Compliant with current antihypertensive regimen of lasix 1/2 tab QD PRN, toprol XL 50mg  daily. Last visit we added losartan 25mg  daily. Does check blood pressures at home: endorses better control but still gets systolic readings >409.  Forgot log. No low blood pressure readings or symptoms of dizziness/syncope.  Denies vision changes, CP/tightness, SOB, leg swelling. Intermittent chest pain (Has seen Dr Fletcher Anon - known 30% blockage by cath 2015). Currently with headache.  BP Readings from Last 3 Encounters:  03/30/17 (!) 170/70  01/26/17 (!) 150/82  01/25/16 (!) 170/84     HLD - she restarted pravastatin, tolerating better.   GERD - worsening last visit so we stopped omeprazole and started dexilant. Unsure if insurance covered this, now on protonix 40mg  daily.   Ongoing nausea in the mornings. Has not tried nausea medications.   Relevant past medical, surgical, family and social history reviewed and updated as indicated. Interim medical history since our last visit reviewed. Allergies and medications reviewed and updated. Outpatient Medications Prior to Visit  Medication Sig Dispense Refill  . albuterol (PROVENTIL HFA;VENTOLIN HFA) 108 (90 BASE) MCG/ACT inhaler Inhale into the lungs every 6 (six) hours as needed for wheezing or shortness of breath.    . Ascorbic Acid (VITAMIN C WITH ROSE HIPS) 500 MG tablet Take 500 mg by mouth daily.    . Cholecalciferol (VITAMIN D PO) Take 5,000 Units by mouth.    . diphenhydrAMINE (BENADRYL) 12.5 MG chewable tablet Chew 12.5 mg by mouth at bedtime as needed for sleep.    Marland Kitchen  docusate sodium (COLACE) 100 MG capsule Take 100 mg by mouth 2 (two) times daily.    Marland Kitchen doxycycline (VIBRAMYCIN) 100 MG capsule Take 100 mg by mouth daily as needed (FOR FACE).     . Fluticasone-Salmeterol (ADVAIR) 250-50 MCG/DOSE AEPB Inhale 1 puff into the lungs 2 (two) times daily.    . Insulin Glargine (LANTUS SOLOSTAR) 100 UNIT/ML Solostar Pen Inject 58 Units into the skin daily with breakfast.     . KLOR-CON M20 20 MEQ tablet TAKE 1 TABLET (20 MEQ TOTAL) BY MOUTH DAILY. 90 tablet 2  . levothyroxine (SYNTHROID, LEVOTHROID) 25 MCG tablet TAKE 1 TABLET BY MOUTH DAILY. EXCEPT 2 TABLETS ON MON, WED, AND FRI 130 tablet 1  . metoprolol succinate (TOPROL-XL) 50 MG 24 hr tablet TAKE 1 TABLET (50 MG TOTAL) BY MOUTH DAILY. 90 tablet 3  . Omega-3 Fatty Acids (OMEGA 3 PO) Take by mouth. 180 EPA/120DHA    . OVER THE COUNTER MEDICATION Calcium magnesium 1000mg  day    . OVER THE COUNTER MEDICATION Women's multivitamin with iron    . pantoprazole (PROTONIX) 40 MG tablet Take 1 tablet (40 mg total) by mouth daily. 30 tablet 6  . vitamin B-12 (CYANOCOBALAMIN) 1000 MCG tablet Take 1,000 mcg by mouth daily.    . furosemide (LASIX) 20 MG tablet TAKE 0.5 TABLETS (10 MG TOTAL) BY MOUTH DAILY. **MUST HAVE PHYSICAL FOR FURTHER REFILLS** 45 tablet 3  .  losartan (COZAAR) 25 MG tablet Take 1 tablet (25 mg total) by mouth daily. 90 tablet 3  . pravastatin (PRAVACHOL) 20 MG tablet Take 1 tablet (20 mg total) by mouth every Monday, Wednesday, and Friday. 45 tablet 1   No facility-administered medications prior to visit.      Per HPI unless specifically indicated in ROS section below Review of Systems     Objective:    BP (!) 170/70 (BP Location: Right Arm, Cuff Size: Large)   Pulse 66   Temp 97.9 F (36.6 C) (Oral)   Wt 199 lb 4 oz (90.4 kg)   SpO2 97%   BMI 32.90 kg/m   Wt Readings from Last 3 Encounters:  03/30/17 199 lb 4 oz (90.4 kg)  01/26/17 198 lb 8 oz (90 kg)  01/25/16 185 lb 12 oz (84.3 kg)      Physical Exam  Constitutional: She appears well-developed and well-nourished. No distress.  HENT:  Mouth/Throat: Oropharynx is clear and moist. No oropharyngeal exudate.  Cardiovascular: Normal rate, regular rhythm, normal heart sounds and intact distal pulses.   No murmur heard. Pulmonary/Chest: Effort normal and breath sounds normal. No respiratory distress. She has no wheezes. She has no rales.  Musculoskeletal: She exhibits no edema.  Skin: Skin is warm and dry. No rash noted.  Psychiatric: She has a normal mood and affect.  Nursing note and vitals reviewed.  Results for orders placed or performed in visit on 01/26/17  HM MAMMOGRAPHY  Result Value Ref Range   HM Mammogram Self Reported Normal 0-4 Bi-Rad, Self Reported Normal  Lipid panel  Result Value Ref Range   Cholesterol 201 (H) 0 - 200 mg/dL   Triglycerides 344.0 (H) 0.0 - 149.0 mg/dL   HDL 33.20 (L) >39.00 mg/dL   VLDL 68.8 (H) 0.0 - 40.0 mg/dL   Total CHOL/HDL Ratio 6    NonHDL 168.29   TSH  Result Value Ref Range   TSH 4.36 0.35 - 4.50 uIU/mL  Comprehensive metabolic panel  Result Value Ref Range   Sodium 137 135 - 145 mEq/L   Potassium 4.1 3.5 - 5.1 mEq/L   Chloride 102 96 - 112 mEq/L   CO2 28 19 - 32 mEq/L   Glucose, Bld 194 (H) 70 - 99 mg/dL   BUN 14 6 - 23 mg/dL   Creatinine, Ser 1.16 0.40 - 1.20 mg/dL   Total Bilirubin 0.6 0.2 - 1.2 mg/dL   Alkaline Phosphatase 88 39 - 117 U/L   AST 16 0 - 37 U/L   ALT 13 0 - 35 U/L   Total Protein 6.9 6.0 - 8.3 g/dL   Albumin 4.0 3.5 - 5.2 g/dL   Calcium 9.3 8.4 - 10.5 mg/dL   GFR 48.55 (L) >60.00 mL/min  Hemoglobin A1c  Result Value Ref Range   Hgb A1c MFr Bld 8.7 (H) 4.6 - 6.5 %  CBC with Differential/Platelet  Result Value Ref Range   WBC 8.6 4.0 - 10.5 K/uL   RBC 4.51 3.87 - 5.11 Mil/uL   Hemoglobin 13.4 12.0 - 15.0 g/dL   HCT 39.6 36.0 - 46.0 %   MCV 87.8 78.0 - 100.0 fl   MCHC 33.9 30.0 - 36.0 g/dL   RDW 14.1 11.5 - 15.5 %   Platelets 178.0 150.0 -  400.0 K/uL   Neutrophils Relative % 64.6 43.0 - 77.0 %   Lymphocytes Relative 24.0 12.0 - 46.0 %   Monocytes Relative 6.4 3.0 - 12.0 %   Eosinophils Relative  4.4 0.0 - 5.0 %   Basophils Relative 0.6 0.0 - 3.0 %   Neutro Abs 5.6 1.4 - 7.7 K/uL   Lymphs Abs 2.1 0.7 - 4.0 K/uL   Monocytes Absolute 0.6 0.1 - 1.0 K/uL   Eosinophils Absolute 0.4 0.0 - 0.7 K/uL   Basophils Absolute 0.0 0.0 - 0.1 K/uL  VITAMIN D 25 Hydroxy (Vit-D Deficiency, Fractures)  Result Value Ref Range   VITD 49.83 30.00 - 100.00 ng/mL  Parathyroid hormone, intact (no Ca)  Result Value Ref Range   PTH 57 14 - 64 pg/mL  LDL cholesterol, direct  Result Value Ref Range   Direct LDL 108.0 mg/dL  HM DIABETES FOOT EXAM  Result Value Ref Range   HM Diabetic Foot Exam at endo   HM DIABETES EYE EXAM  Result Value Ref Range   HM Diabetic Eye Exam No Retinopathy No Retinopathy      Assessment & Plan:   Problem List Items Addressed This Visit    Essential hypertension, benign - Primary    Chronic, uncontrolled. Increase losartan to 50mg  daily. Check Cr today. RTC 2-3 mo f/u visit.       Relevant Medications   pravastatin (PRAVACHOL) 20 MG tablet   furosemide (LASIX) 20 MG tablet   losartan (COZAAR) 50 MG tablet   Other Relevant Orders   Basic metabolic panel   HLD (hyperlipidemia)    Reports taking pravastatin daily. Continue. Update dLDL today.       Relevant Medications   pravastatin (PRAVACHOL) 20 MG tablet   furosemide (LASIX) 20 MG tablet   losartan (COZAAR) 50 MG tablet   Other Relevant Orders   LDL Cholesterol, Direct   Nausea    Ongoing. Will trial zofran.           Follow up plan: No Follow-up on file.  Ria Bush, MD

## 2017-03-30 NOTE — Patient Instructions (Addendum)
Blood pressure is staying elevated - increase losartan to 50mg  daily. May double up on current dose, higher dose will be at pharmacy.  Return in 2-3 months for follow up visit.  Labs today.

## 2017-04-02 ENCOUNTER — Other Ambulatory Visit: Payer: Self-pay | Admitting: Family Medicine

## 2017-04-02 MED ORDER — PRAVASTATIN SODIUM 40 MG PO TABS: 40.0000 mg | ORAL_TABLET | Freq: Every day | ORAL | 1 refills | Status: DC

## 2017-05-06 DIAGNOSIS — Z6831 Body mass index (BMI) 31.0-31.9, adult: Secondary | ICD-10-CM | POA: Diagnosis not present

## 2017-05-06 DIAGNOSIS — Z90712 Acquired absence of cervix with remaining uterus: Secondary | ICD-10-CM | POA: Diagnosis not present

## 2017-05-06 DIAGNOSIS — Z1272 Encounter for screening for malignant neoplasm of vagina: Secondary | ICD-10-CM | POA: Diagnosis not present

## 2017-05-06 DIAGNOSIS — N958 Other specified menopausal and perimenopausal disorders: Secondary | ICD-10-CM | POA: Diagnosis not present

## 2017-05-06 DIAGNOSIS — M8588 Other specified disorders of bone density and structure, other site: Secondary | ICD-10-CM | POA: Diagnosis not present

## 2017-05-06 DIAGNOSIS — Z124 Encounter for screening for malignant neoplasm of cervix: Secondary | ICD-10-CM | POA: Diagnosis not present

## 2017-05-18 DIAGNOSIS — E669 Obesity, unspecified: Secondary | ICD-10-CM | POA: Diagnosis not present

## 2017-05-18 DIAGNOSIS — N183 Chronic kidney disease, stage 3 (moderate): Secondary | ICD-10-CM | POA: Diagnosis not present

## 2017-05-18 DIAGNOSIS — E782 Mixed hyperlipidemia: Secondary | ICD-10-CM | POA: Diagnosis not present

## 2017-05-18 DIAGNOSIS — E1142 Type 2 diabetes mellitus with diabetic polyneuropathy: Secondary | ICD-10-CM | POA: Diagnosis not present

## 2017-05-18 DIAGNOSIS — E039 Hypothyroidism, unspecified: Secondary | ICD-10-CM | POA: Diagnosis not present

## 2017-05-18 DIAGNOSIS — Z794 Long term (current) use of insulin: Secondary | ICD-10-CM | POA: Diagnosis not present

## 2017-05-18 DIAGNOSIS — E1165 Type 2 diabetes mellitus with hyperglycemia: Secondary | ICD-10-CM | POA: Diagnosis not present

## 2017-06-15 ENCOUNTER — Ambulatory Visit: Payer: Medicare Other | Admitting: Family Medicine

## 2017-06-15 ENCOUNTER — Other Ambulatory Visit: Payer: Self-pay | Admitting: Family Medicine

## 2017-06-24 ENCOUNTER — Other Ambulatory Visit: Payer: Self-pay | Admitting: Family Medicine

## 2017-06-24 ENCOUNTER — Encounter: Payer: Self-pay | Admitting: Hematology and Oncology

## 2017-06-24 DIAGNOSIS — R922 Inconclusive mammogram: Secondary | ICD-10-CM | POA: Diagnosis not present

## 2017-06-24 DIAGNOSIS — Z853 Personal history of malignant neoplasm of breast: Secondary | ICD-10-CM | POA: Diagnosis not present

## 2017-06-24 DIAGNOSIS — D241 Benign neoplasm of right breast: Secondary | ICD-10-CM | POA: Diagnosis not present

## 2017-06-24 DIAGNOSIS — Z1231 Encounter for screening mammogram for malignant neoplasm of breast: Secondary | ICD-10-CM | POA: Diagnosis not present

## 2017-06-24 DIAGNOSIS — R921 Mammographic calcification found on diagnostic imaging of breast: Secondary | ICD-10-CM | POA: Diagnosis not present

## 2017-06-24 DIAGNOSIS — N6021 Fibroadenosis of right breast: Secondary | ICD-10-CM | POA: Diagnosis not present

## 2017-06-24 DIAGNOSIS — N6031 Fibrosclerosis of right breast: Secondary | ICD-10-CM | POA: Diagnosis not present

## 2017-06-25 ENCOUNTER — Other Ambulatory Visit: Payer: Self-pay | Admitting: Family Medicine

## 2017-06-25 ENCOUNTER — Other Ambulatory Visit: Payer: Self-pay | Admitting: Radiology

## 2017-06-25 ENCOUNTER — Encounter: Payer: Self-pay | Admitting: Family Medicine

## 2017-06-25 DIAGNOSIS — Z853 Personal history of malignant neoplasm of breast: Secondary | ICD-10-CM | POA: Diagnosis not present

## 2017-06-25 DIAGNOSIS — R922 Inconclusive mammogram: Secondary | ICD-10-CM | POA: Diagnosis not present

## 2017-06-25 DIAGNOSIS — Z1231 Encounter for screening mammogram for malignant neoplasm of breast: Secondary | ICD-10-CM | POA: Diagnosis not present

## 2017-06-25 DIAGNOSIS — N6031 Fibrosclerosis of right breast: Secondary | ICD-10-CM | POA: Diagnosis not present

## 2017-06-25 DIAGNOSIS — R921 Mammographic calcification found on diagnostic imaging of breast: Secondary | ICD-10-CM | POA: Diagnosis not present

## 2017-06-25 DIAGNOSIS — D241 Benign neoplasm of right breast: Secondary | ICD-10-CM | POA: Diagnosis not present

## 2017-06-25 DIAGNOSIS — N6021 Fibroadenosis of right breast: Secondary | ICD-10-CM | POA: Diagnosis not present

## 2017-06-25 NOTE — Telephone Encounter (Signed)
Duplicate Request.

## 2017-06-25 NOTE — Telephone Encounter (Signed)
Last OV:  03/30/17  Next OV:  none

## 2017-06-28 MED ORDER — LOSARTAN POTASSIUM 50 MG PO TABS: 50.0000 mg | ORAL_TABLET | Freq: Every day | ORAL | 0 refills | Status: DC

## 2017-10-09 ENCOUNTER — Other Ambulatory Visit: Payer: Self-pay | Admitting: Family Medicine

## 2017-10-12 DIAGNOSIS — C50912 Malignant neoplasm of unspecified site of left female breast: Secondary | ICD-10-CM | POA: Diagnosis not present

## 2017-10-27 ENCOUNTER — Ambulatory Visit: Payer: Medicare Other | Attending: General Surgery | Admitting: Physical Therapy

## 2017-10-27 ENCOUNTER — Other Ambulatory Visit: Payer: Self-pay

## 2017-10-27 DIAGNOSIS — M6281 Muscle weakness (generalized): Secondary | ICD-10-CM | POA: Insufficient documentation

## 2017-10-27 DIAGNOSIS — Z483 Aftercare following surgery for neoplasm: Secondary | ICD-10-CM | POA: Diagnosis not present

## 2017-10-27 DIAGNOSIS — M25612 Stiffness of left shoulder, not elsewhere classified: Secondary | ICD-10-CM | POA: Insufficient documentation

## 2017-10-27 NOTE — Therapy (Signed)
[place], [place], [address] Phone: [phone]   Fax:  [phone]  Physical Therapy Evaluation  Patient Details  Name: Julia Banks MRN: [id number] Date of Birth: 1940/12/23 Referring Provider: Dr. [name]   Encounter Date: 10/27/2017  PT End of Session - 10/27/17 2020    Visit Number  1    Number of Visits  9    Date for PT Re-Evaluation  11/27/17    PT Start Time  5277 Pt. was late today    PT Stop Time  1434    PT Time Calculation (min)  36 min    Activity Tolerance  Patient tolerated treatment well    Behavior During Therapy  [facility] for tasks assessed/performed       Past Medical History:  Diagnosis Date  . Arthritis   . Asthma   . Breast cancer, left breast ([place]) 02/2014   DCIS, ER+ PR- initially rec bilat mastectomy with sentinal LN biopsy L axilla but now planning just L mastectomy [name])  . Chronic kidney disease, stage 3 ([place])   . Colon polyps    benign  . Complication of anesthesia    hard to wake up  . Coronary artery disease    30% stenosis reported on previous cardiac catheterization in 2006. This was done in [facility]  . GERD (gastroesophageal reflux disease)   . Headaches, cluster   . Hyperlipidemia   . Hypertension   . Hypothyroidism   . Osteopenia 07/2014   DEXA 0.5 spine, 01.7 R femoral neck  . Pneumonia    years ago  . Secondary hyperparathyroidism of renal origin ([place])    likely  . Sensorineural hearing loss (SNHL) of both ears 01/27/2016   Audiology eval - moderately severe sensorineural hearing loss bilaterally rec amplification (07/2016 - [name] audiologist)  . Sleep apnea    on CPAP  . Thyroid disease   . Type 2 diabetes, uncontrolled, with neuropathy ([place])    followed by endo Dr Buddy Duty    Past Surgical History:  Procedure Laterality Date  . ABDOMINAL HYSTERECTOMY     heavy bleeding, ovaries remain  .  APPENDECTOMY    . BREAST BIOPSY Right    benign  . BREAST BIOPSY Left 02/2014   ?DCIS  . CARDIAC CATHETERIZATION  2006   [place], [place].   . CARDIAC CATHETERIZATION  10/2013   mild 2v nonobstructive disease, EF 60%  . CARDIOVASCULAR STRESS TEST  09/2012   WNL ([place])  . CHOLECYSTECTOMY    . COLONOSCOPY  05/2010   2 TAs, diverticulosis, rpt 2 yrs (Dr Elissa Hefty in [place])  . COLONOSCOPY  12/2014   1 TA, rpt 5 yrs Henrene Pastor)  . cortisone right knee  01/04/15  . GALLBLADDER SURGERY    . MASTECTOMY W/ SENTINEL NODE BIOPSY Left 06/07/2014   DR WAKEFIELD  . SIMPLE MASTECTOMY WITH AXILLARY SENTINEL NODE BIOPSY Left 06/07/2014   Procedure: LEFT TOTAL MASTECTOMY WITH LEFT AXILLARY SENTINEL NODE BIOPSY;  Surgeon: Rolm Bookbinder, MD;  Location: [place];  Service: General;  Laterality: Left;    There were no vitals filed for this visit.   Subjective Assessment - 10/27/17 1400    Subjective  "I can't use my left arm as much--I can't raise it up to do my hair or hold it up."    Patient is accompained by:  Family member niece, who is a Designer, jewellery    Pertinent History  Left breast  cancer diagnosed in October 2015 and had mastectomy that December with SLNB (4-5 nodes). Cancer was DCIS. Tried estrogen blocker but couldn't tolerate side effects. Saw Dr. [name] for a routine follow-up and he referred her for therapy for limited motion for left shoulder.  Did have lymph node biopsy last year, but that was negative; area still bothers her. HTN under control with meds; diabetes not fully controlled, but has started a new medication. Hypothyroid, hyperlipidemia.    Patient Stated Goals  get the left arm where she can use it better--hold it up    Currently in Pain?  No/denies         [facility] PT Assessment - 10/27/17 0001      Assessment   Medical Diagnosis  left breast cancer    Referring Provider  Dr. [name]    Onset Date/Surgical Date  06/13/14 approx.    Hand Dominance  Right    Prior Therapy   none      Precautions   Precautions  Other (comment)    Precaution Comments  cancer precautions      Restrictions   Weight Bearing Restrictions  No      Balance Screen   Has the patient fallen in the past 6 months  Yes    How many times?  1 when mowing lawn, stepped back over tree root    Has the patient had a decrease in activity level because of a fear of falling?   No    Is the patient reluctant to leave their home because of a fear of falling?   No      Home Environment   Living Environment  Private residence    Living Arrangements  Alone    Type of [place]  Two level one plus bonus room; doesn't need to go up there    Alternate Level Stairs-Number of Steps  12    Alternate Level Stairs-Rails  Right;Left    Additional Comments  house is handicap-equipped      Prior Function   Level of Independence  Independent    Leisure  is active with her church, sews; walks around the backyard, but at a slow pace      Cognition   Overall Cognitive Status  Impaired/Different from baseline    Area of Impairment  Memory relies on her niece to help with history, meds      Posture/Postural Control   Posture/Postural Control  --    Postural Limitations  Forward head;Rounded Shoulders      ROM / Strength   AROM / PROM / Strength  AROM;PROM;Strength      AROM   Overall AROM Comments  good scapular retraction    AROM Assessment Site  Shoulder    Right/Left Shoulder  Right;Left    Right Shoulder Flexion  139 Degrees    Right Shoulder ABduction  155 Degrees    Right Shoulder Internal Rotation  -- WFL in supine    Right Shoulder External Rotation  72 Degrees    Left Shoulder Flexion  95 Degrees    Left Shoulder ABduction  97 Degrees    Left Shoulder Internal Rotation  -- WFL in supine    Left Shoulder External Rotation  68 Degrees      PROM   PROM Assessment Site  Shoulder    Right/Left Shoulder  Left    Left Shoulder Flexion  124 Degrees    Left Shoulder  ABduction  125 Degrees      Strength   Overall Strength Comments  right shoulder grossly 5/5, left 3-/5      Palpation   Palpation comment  fairly good soft tissue mobility at left chest incision      Ambulation/Gait   Gait Comments  reports her feet and legs hurt if she walks much                Objective measurements completed on examination: See above findings.                   PT Long Term Goals - 10/27/17 2030      PT LONG TERM GOAL #1   Title  Pt. will be independent in HEP for left shoulder ROM and strengthening.    Time  4    Period  Weeks    Status  New      PT LONG TERM GOAL #2   Title  Pt. will have at least 130 degrees of left shoulder flexion for improved overhead reach.    Baseline  95 at eval compared to 139 on right.    Time  4    Period  Weeks    Status  New      PT LONG TERM GOAL #3   Title  Pt. will have at least 140 degrees of left shoulder abduction for improved ADLs.    Baseline  97 on left compared to 155 on right at eval    Time  4    Period  Weeks    Status  New      PT LONG TERM GOAL #4   Title  Pt. will report being able to hold up her left arm to fix her hair with at least 60% greater ease.    Time  4    Period  Weeks    Status  New             Plan - 10/27/17 2023    Clinical Impression Statement  This is a pleasant woman who is accompanied today by her niece, who is a Designer, jewellery. Pt. had left breast cancer 3.5 years ago, DCIS, and was treated with lumpectomy and SLNB. She has significantly limited left shoulder AROM and strength. Pt. reports difficulty holding her left arm up to do things like fix her hair.     History and Personal Factors relevant to plan of care:  Diabetes, possible memory problems    Clinical Presentation  Stable    Clinical Decision Making  Low    Rehab Potential  Good    PT Frequency  2x / week she may have to cut to 1x/week because of transportation    PT Duration  4 weeks     PT Treatment/Interventions  ADLs/Self Care Home Management;Therapeutic exercise;Patient/family education;Manual techniques;Passive range of motion;Scar mobilization    PT Next Visit Plan  Begin P/AA/A/ROM to left shoulder; instruct patient in HEP for same.  Progress to strengthening of left shoulder.    Consulted and Agree with Plan of Care  Patient       Patient will benefit from skilled therapeutic intervention in order to improve the following deficits and impairments:  Decreased range of motion, Decreased strength, Impaired UE functional use  Visit Diagnosis: Stiffness of left shoulder, not elsewhere classified - Plan: PT plan of care cert/re-cert  Aftercare following surgery for neoplasm - Plan: PT plan of care cert/re-cert  Muscle weakness (generalized) - Plan: PT  plan of care cert/re-cert     Problem List Patient Active Problem List   Diagnosis Date Noted  . Nausea 01/26/2017  . Advanced care planning/counseling discussion 01/27/2016  . Sensorineural hearing loss (SNHL) of both ears 01/27/2016  . Chest discomfort 01/27/2016  . Insomnia 02/13/2015  . Chronic cough 10/10/2014  . Osteopenia 07/31/2014  . S/P mastectomy 06/07/2014  . Genetic testing 06/02/2014  . Breast cancer of upper-outer quadrant of left female breast ([place]) 03/28/2014  . Grieving 10/25/2013  . Secondary hyperparathyroidism of renal origin ([place])   . Medicare annual wellness visit, subsequent 06/28/2013  . Diabetes mellitus with stage 3 chronic kidney disease ([place])   . Hypothyroidism 04/13/2013  . GERD (gastroesophageal reflux disease)   . Essential hypertension, benign 09/17/2012  . HLD (hyperlipidemia) 09/17/2012  . Type 2 diabetes, uncontrolled, with neuropathy ([place]) 02/15/2012  . OSA on CPAP 02/13/2012  . Coronary artery disease 02/13/2012    SALISBURY,DONNA 10/27/2017, 8:38 PM  [place], [place],  [address] Phone: [phone]   Fax:  [phone]  Name: Julia Banks MRN: [id number] Date of Birth: [birth year]  Serafina Royals, PT 10/27/17 8:38 PM

## 2017-11-03 ENCOUNTER — Encounter: Payer: Self-pay | Admitting: Physical Therapy

## 2017-11-03 ENCOUNTER — Ambulatory Visit: Payer: Medicare Other | Attending: General Surgery | Admitting: Physical Therapy

## 2017-11-03 DIAGNOSIS — Z483 Aftercare following surgery for neoplasm: Secondary | ICD-10-CM | POA: Diagnosis not present

## 2017-11-03 DIAGNOSIS — M25612 Stiffness of left shoulder, not elsewhere classified: Secondary | ICD-10-CM

## 2017-11-03 DIAGNOSIS — M6281 Muscle weakness (generalized): Secondary | ICD-10-CM | POA: Diagnosis not present

## 2017-11-03 NOTE — Patient Instructions (Signed)

## 2017-11-03 NOTE — Therapy (Signed)
[place], [place], [address] Phone: [phone]   Fax:  [phone]  Physical Therapy Treatment  Patient Details  Name: Julia Banks MRN: [id number] Date of Birth: 1940-07-24 Referring Provider: Dr. [name]   Encounter Date: 11/03/2017  PT End of Session - 11/03/17 1856    Visit Number  2    Number of Visits  9    Date for PT Re-Evaluation  11/27/17    PT Start Time  2376    PT Stop Time  1600    PT Time Calculation (min)  45 min    Activity Tolerance  Patient tolerated treatment well    Behavior During Therapy  [facility] for tasks assessed/performed       Past Medical History:  Diagnosis Date  . Arthritis   . Asthma   . Breast cancer, left breast ([place]) 02/2014   DCIS, ER+ PR- initially rec bilat mastectomy with sentinal LN biopsy L axilla but now planning just L mastectomy [name])  . Chronic kidney disease, stage 3 ([place])   . Colon polyps    benign  . Complication of anesthesia    hard to wake up  . Coronary artery disease    30% stenosis reported on previous cardiac catheterization in 2006. This was done in [facility]  . GERD (gastroesophageal reflux disease)   . Headaches, cluster   . Hyperlipidemia   . Hypertension   . Hypothyroidism   . Osteopenia 07/2014   DEXA 0.5 spine, 01.7 R femoral neck  . Pneumonia    years ago  . Secondary hyperparathyroidism of renal origin ([place])    likely  . Sensorineural hearing loss (SNHL) of both ears 01/27/2016   Audiology eval - moderately severe sensorineural hearing loss bilaterally rec amplification (07/2016 - [name] audiologist)  . Sleep apnea    on CPAP  . Thyroid disease   . Type 2 diabetes, uncontrolled, with neuropathy ([place])    followed by endo Dr Buddy Duty    Past Surgical History:  Procedure Laterality Date  . ABDOMINAL HYSTERECTOMY     heavy bleeding, ovaries remain  . APPENDECTOMY    . BREAST BIOPSY  Right    benign  . BREAST BIOPSY Left 02/2014   ?DCIS  . CARDIAC CATHETERIZATION  2006   [place], [place].   . CARDIAC CATHETERIZATION  10/2013   mild 2v nonobstructive disease, EF 60%  . CARDIOVASCULAR STRESS TEST  09/2012   WNL ([place])  . CHOLECYSTECTOMY    . COLONOSCOPY  05/2010   2 TAs, diverticulosis, rpt 2 yrs (Dr Elissa Hefty in [place])  . COLONOSCOPY  12/2014   1 TA, rpt 5 yrs Henrene Pastor)  . cortisone right knee  01/04/15  . GALLBLADDER SURGERY    . MASTECTOMY W/ SENTINEL NODE BIOPSY Left 06/07/2014   DR WAKEFIELD  . SIMPLE MASTECTOMY WITH AXILLARY SENTINEL NODE BIOPSY Left 06/07/2014   Procedure: LEFT TOTAL MASTECTOMY WITH LEFT AXILLARY SENTINEL NODE BIOPSY;  Surgeon: Rolm Bookbinder, MD;  Location: [place];  Service: General;  Laterality: Left;    There were no vitals filed for this visit.  Subjective Assessment - 11/03/17 1523    Subjective  Pt says she is doing pretty good. She was trimming some shrubbery yesterday and is having a little soreness     Patient is accompained by:  Family member grand neice     Pertinent History  Left breast cancer diagnosed in October 2015 and had mastectomy  that December with SLNB (4-5 nodes). Cancer was DCIS. Tried estrogen blocker but couldn't tolerate side effects. Saw Dr. [name] for a routine follow-up and he referred her for therapy for limited motion for left shoulder.  Did have lymph node biopsy last year, but that was negative; area still bothers her. HTN under control with meds; diabetes not fully controlled, but has started a new medication. Hypothyroid, hyperlipidemia.    Patient Stated Goals  get the left arm where she can use it better--hold it up    Currently in Pain?  Yes    Pain Score  2     Pain Orientation  Left    Pain Descriptors / Indicators  Sore    Pain Type  Acute pain    Pain Radiating Towards  none     Pain Onset  In the past 7 days                       [facility] Adult PT Treatment/Exercise - 11/03/17 0001       Exercises   Exercises  Shoulder      Shoulder Exercises: Supine   Protraction  AROM;Right;Left;20 reps    Other Supine Exercises  instruced in home program for dowel rod flexion and abduction and performed 10 reps of each with 5 sec holds       Shoulder Exercises: Sidelying   External Rotation  AROM;Left;10 reps with folded towel at waist     ABduction  AROM;Left;10 reps with extra stretch at end range    Other Sidelying Exercises  hand pointed to celining and small circles with 5 reps in each direction with close monitoring with verbal and tactile cues for smooth coordinated movment       Shoulder Exercises: Standing   Flexion  AROM;Right;Left;5 reps    ABduction  AROM;Right;Left;5 reps    Retraction  AROM;Both;10 reps      Shoulder Exercises: Pulleys   Flexion  2 minutes    Flexion Limitations  verbal cues to keep shoulder down     ABduction  2 minutes verbal cues to keep shoulder down       Shoulder Exercises: Therapy Ball   Flexion  Right;Left;10 reps      Neck Exercises: Stretches   Other Neck Stretches  in sitting neck ROM with stretch for 2 seconds at end range              PT Education - 11/03/17 1855    Education provided  Yes    Education Details  supine dowel shoulder exercise program     Person(s) Educated  Patient    Methods  Explanation;Demonstration;Handout    Comprehension  Verbalized understanding;Returned demonstration          PT Long Term Goals - 10/27/17 2030      PT LONG TERM GOAL #1   Title  Pt. will be independent in HEP for left shoulder ROM and strengthening.    Time  4    Period  Weeks    Status  New      PT LONG TERM GOAL #2   Title  Pt. will have at least 130 degrees of left shoulder flexion for improved overhead reach.    Baseline  95 at eval compared to 139 on right.    Time  4    Period  Weeks    Status  New      PT LONG TERM GOAL #3   Title  Pt. will have at least 140 degrees of left shoulder abduction for improved  ADLs.    Baseline  97 on left compared to 155 on right at eval    Time  4    Period  Weeks    Status  New      PT LONG TERM GOAL #4   Title  Pt. will report being able to hold up her left arm to fix her hair with at least 60% greater ease.    Time  4    Period  Weeks    Status  New            Plan - 11/03/17 1857    Clinical Impression Statement  Pt participated well with PT for strenthening and range of motion of left shoulder.  She pointed out some swelling that she has at left axilla and states it is time for her to get new mastectomy bras.      Rehab Potential  Good    PT Frequency  2x / week    PT Next Visit Plan  assess left axillary area for lymphedema and make recommendations for compression bra if indicated. Progress with shoulder ROM and strengthening    Consulted and Agree with Plan of Care  Patient       Patient will benefit from skilled therapeutic intervention in order to improve the following deficits and impairments:  Decreased range of motion, Decreased strength, Impaired UE functional use  Visit Diagnosis: Stiffness of left shoulder, not elsewhere classified  Aftercare following surgery for neoplasm  Muscle weakness (generalized)     Problem List Patient Active Problem List   Diagnosis Date Noted  . Nausea 01/26/2017  . Advanced care planning/counseling discussion 01/27/2016  . Sensorineural hearing loss (SNHL) of both ears 01/27/2016  . Chest discomfort 01/27/2016  . Insomnia 02/13/2015  . Chronic cough 10/10/2014  . Osteopenia 07/31/2014  . S/P mastectomy 06/07/2014  . Genetic testing 06/02/2014  . Breast cancer of upper-outer quadrant of left female breast ([place]) 03/28/2014  . Grieving 10/25/2013  . Secondary hyperparathyroidism of renal origin ([place])   . Medicare annual wellness visit, subsequent 06/28/2013  . Diabetes mellitus with stage 3 chronic kidney disease ([place])   . Hypothyroidism 04/13/2013  . GERD (gastroesophageal reflux disease)    . Essential hypertension, benign 09/17/2012  . HLD (hyperlipidemia) 09/17/2012  . Type 2 diabetes, uncontrolled, with neuropathy ([place]) 02/15/2012  . OSA on CPAP 02/13/2012  . Coronary artery disease 02/13/2012   Donato Heinz. Owens Shark PT  Norwood Levo 11/03/2017, 7:00 PM  [place], [place], [address] Phone: [phone]   Fax:  [phone]  Name: Julia Banks MRN: [id number] Date of Birth: 1940-07-28

## 2017-11-06 ENCOUNTER — Encounter: Payer: Self-pay | Admitting: Physical Therapy

## 2017-11-06 ENCOUNTER — Ambulatory Visit: Payer: Medicare Other | Admitting: Physical Therapy

## 2017-11-06 DIAGNOSIS — Z483 Aftercare following surgery for neoplasm: Secondary | ICD-10-CM | POA: Diagnosis not present

## 2017-11-06 DIAGNOSIS — M6281 Muscle weakness (generalized): Secondary | ICD-10-CM

## 2017-11-06 DIAGNOSIS — M25612 Stiffness of left shoulder, not elsewhere classified: Secondary | ICD-10-CM | POA: Diagnosis not present

## 2017-11-06 NOTE — Patient Instructions (Signed)

## 2017-11-06 NOTE — Therapy (Signed)
[place], [place], [address] Phone: [phone]   Fax:  [phone]  Physical Therapy Treatment  Patient Details  Name: Julia Banks MRN: [id number] Date of Birth: August 20, 1940 Referring Provider: Dr. [name]   Encounter Date: 11/06/2017  PT End of Session - 11/06/17 0849    Visit Number  2    Number of Visits  9    Date for PT Re-Evaluation  11/27/17    PT Start Time  0804    PT Stop Time  0846    PT Time Calculation (min)  42 min    Activity Tolerance  Patient tolerated treatment well    Behavior During Therapy  [facility] for tasks assessed/performed       Past Medical History:  Diagnosis Date  . Arthritis   . Asthma   . Breast cancer, left breast ([place]) 02/2014   DCIS, ER+ PR- initially rec bilat mastectomy with sentinal LN biopsy L axilla but now planning just L mastectomy [name])  . Chronic kidney disease, stage 3 ([place])   . Colon polyps    benign  . Complication of anesthesia    hard to wake up  . Coronary artery disease    30% stenosis reported on previous cardiac catheterization in 2006. This was done in [facility]  . GERD (gastroesophageal reflux disease)   . Headaches, cluster   . Hyperlipidemia   . Hypertension   . Hypothyroidism   . Osteopenia 07/2014   DEXA 0.5 spine, 01.7 R femoral neck  . Pneumonia    years ago  . Secondary hyperparathyroidism of renal origin ([place])    likely  . Sensorineural hearing loss (SNHL) of both ears 01/27/2016   Audiology eval - moderately severe sensorineural hearing loss bilaterally rec amplification (07/2016 - [name] audiologist)  . Sleep apnea    on CPAP  . Thyroid disease   . Type 2 diabetes, uncontrolled, with neuropathy ([place])    followed by endo Dr Buddy Duty    Past Surgical History:  Procedure Laterality Date  . ABDOMINAL HYSTERECTOMY     heavy bleeding, ovaries remain  . APPENDECTOMY    . BREAST  BIOPSY Right    benign  . BREAST BIOPSY Left 02/2014   ?DCIS  . CARDIAC CATHETERIZATION  2006   [place], [place].   . CARDIAC CATHETERIZATION  10/2013   mild 2v nonobstructive disease, EF 60%  . CARDIOVASCULAR STRESS TEST  09/2012   WNL ([place])  . CHOLECYSTECTOMY    . COLONOSCOPY  05/2010   2 TAs, diverticulosis, rpt 2 yrs (Dr Elissa Hefty in [place])  . COLONOSCOPY  12/2014   1 TA, rpt 5 yrs Henrene Pastor)  . cortisone right knee  01/04/15  . GALLBLADDER SURGERY    . MASTECTOMY W/ SENTINEL NODE BIOPSY Left 06/07/2014   DR WAKEFIELD  . SIMPLE MASTECTOMY WITH AXILLARY SENTINEL NODE BIOPSY Left 06/07/2014   Procedure: LEFT TOTAL MASTECTOMY WITH LEFT AXILLARY SENTINEL NODE BIOPSY;  Surgeon: Rolm Bookbinder, MD;  Location: [place];  Service: General;  Laterality: Left;    There were no vitals filed for this visit.  Subjective Assessment - 11/06/17 0804    Subjective  My left shoulder is a little more sore today. I think it is from the exercises.     Pertinent History  Left breast cancer diagnosed in October 2015 and had mastectomy that December with SLNB (4-5 nodes). Cancer was DCIS. Tried estrogen blocker but couldn't tolerate side  effects. Saw Dr. [name] for a routine follow-up and he referred her for therapy for limited motion for left shoulder.  Did have lymph node biopsy last year, but that was negative; area still bothers her. HTN under control with meds; diabetes not fully controlled, but has started a new medication. Hypothyroid, hyperlipidemia.    Patient Stated Goals  get the left arm where she can use it better--hold it up    Currently in Pain?  No/denies    Pain Score  0-No pain         [facility] PT Assessment - 11/06/17 0001      AROM   Left Shoulder Flexion  142 Degrees    Left Shoulder ABduction  130 Degrees                   [facility] Adult PT Treatment/Exercise - 11/06/17 0001      Shoulder Exercises: Supine   Horizontal ABduction  Strengthening;Both;10 reps;Theraband pt  returned therapist demo    External Rotation  Strengthening;Both;10 reps;Theraband pt returned therapist demo    Theraband Level (Shoulder External Rotation)  Level 1 (Yellow)    Flexion  Strengthening;Both;10 reps;Theraband pt returned therapist demo, narrow and wide grip    Theraband Level (Shoulder Flexion)  Level 1 (Yellow)    Other Supine Exercises  D2 x 10 reps bilaterally with yellow band - pt returned therapist demo      Shoulder Exercises: Pulleys   Flexion  2 minutes    Flexion Limitations  verbal cues to keep shoulder down     ABduction  2 minutes verbal cues to keep shoulder down       Shoulder Exercises: Therapy Ball   Flexion  Right;Left;10 reps    ABduction  Left;10 reps pt returned therapist demo      Manual Therapy   Manual Therapy  Myofascial release;Passive ROM    Myofascial Release  to left axilla using cross hands    Passive ROM  to left shoulder in direction of flexion, abduction and ER to pt's tolerance while having pt actively retract and depress scapula to avoid shoulder popping and discomfort in joint                  PT Long Term Goals - 11/06/17 0848      PT LONG TERM GOAL #1   Title  Pt. will be independent in HEP for left shoulder ROM and strengthening.    Time  4    Period  Weeks    Status  On-going      PT LONG TERM GOAL #2   Title  Pt. will have at least 130 degrees of left shoulder flexion for improved overhead reach.    Baseline  95 at eval compared to 139 on right., 11/06/17- 142    Time  4    Period  Weeks    Status  Achieved      PT LONG TERM GOAL #3   Title  Pt. will have at least 140 degrees of left shoulder abduction for improved ADLs.    Baseline  97 on left compared to 155 on right at eval, 11/06/17- 130    Time  4    Period  Weeks    Status  On-going      PT LONG TERM GOAL #4   Title  Pt. will report being able to hold up her left arm to fix her hair with at least 60% greater ease.  Time  4    Period  Weeks     Status  On-going            Plan - 11/06/17 0850    Clinical Impression Statement  Pt states she has been compliant with her home exercise program. Today added manual therapy for stretching of left shoulder and performed passive ROM to left shoulder. Pt did have some discomfort with popping/clicking in joint. Educated pt to keep shoulder blades retracted and depressed and this helped with PROM. Educated pt in supine scapular strengthening exercises to further increase scapular strength and improve glenohumeral rhythnm. Pt demonstrates marked improvement in AROM today.     Rehab Potential  Good    PT Frequency  2x / week    PT Duration  4 weeks    PT Treatment/Interventions  ADLs/Self Care Home Management;Therapeutic exercise;Patient/family education;Manual techniques;Passive range of motion;Scar mobilization    PT Next Visit Plan  assess left axillary area for lymphedema and make recommendations for compression bra if indicated. Progress with shoulder ROM and strengthening, assess indep w supine scap    PT Home Exercise Plan  supine scap    Consulted and Agree with Plan of Care  Patient       Patient will benefit from skilled therapeutic intervention in order to improve the following deficits and impairments:  Decreased range of motion, Decreased strength, Impaired UE functional use  Visit Diagnosis: Stiffness of left shoulder, not elsewhere classified  Aftercare following surgery for neoplasm  Muscle weakness (generalized)     Problem List Patient Active Problem List   Diagnosis Date Noted  . Nausea 01/26/2017  . Advanced care planning/counseling discussion 01/27/2016  . Sensorineural hearing loss (SNHL) of both ears 01/27/2016  . Chest discomfort 01/27/2016  . Insomnia 02/13/2015  . Chronic cough 10/10/2014  . Osteopenia 07/31/2014  . S/P mastectomy 06/07/2014  . Genetic testing 06/02/2014  . Breast cancer of upper-outer quadrant of left female breast ([place]) 03/28/2014   . Grieving 10/25/2013  . Secondary hyperparathyroidism of renal origin ([place])   . Medicare annual wellness visit, subsequent 06/28/2013  . Diabetes mellitus with stage 3 chronic kidney disease ([place])   . Hypothyroidism 04/13/2013  . GERD (gastroesophageal reflux disease)   . Essential hypertension, benign 09/17/2012  . HLD (hyperlipidemia) 09/17/2012  . Type 2 diabetes, uncontrolled, with neuropathy ([place]) 02/15/2012  . OSA on CPAP 02/13/2012  . Coronary artery disease 02/13/2012    Allyson Sabal [facility] 11/06/2017, 8:52 AM  [place], [place], [address] Phone: [phone]   Fax:  [phone]  Name: Julia Banks MRN: [id number] Date of Birth: 03-Jun-1941  Manus Gunning, PT 11/06/17 8:53 AM

## 2017-11-10 ENCOUNTER — Encounter: Payer: Self-pay | Admitting: Physical Therapy

## 2017-11-10 ENCOUNTER — Ambulatory Visit: Payer: Medicare Other | Admitting: Physical Therapy

## 2017-11-10 DIAGNOSIS — M25612 Stiffness of left shoulder, not elsewhere classified: Secondary | ICD-10-CM | POA: Diagnosis not present

## 2017-11-10 DIAGNOSIS — M6281 Muscle weakness (generalized): Secondary | ICD-10-CM

## 2017-11-10 DIAGNOSIS — Z483 Aftercare following surgery for neoplasm: Secondary | ICD-10-CM

## 2017-11-10 NOTE — Therapy (Signed)
[place], [place], [address] Phone: [phone]   Fax:  [phone]  Physical Therapy Treatment  Patient Details  Name: Julia Banks MRN: [id number] Date of Birth: 21-Dec-1940 Referring Provider: Dr. [name]   Encounter Date: 11/10/2017  PT End of Session - 11/10/17 1515    Visit Number  4    Number of Visits  9    Date for PT Re-Evaluation  11/27/17    PT Start Time  1430    PT Stop Time  1515    PT Time Calculation (min)  45 min    Activity Tolerance  Patient tolerated treatment well    Behavior During Therapy  [facility] for tasks assessed/performed       Past Medical History:  Diagnosis Date  . Arthritis   . Asthma   . Breast cancer, left breast ([place]) 02/2014   DCIS, ER+ PR- initially rec bilat mastectomy with sentinal LN biopsy L axilla but now planning just L mastectomy [name])  . Chronic kidney disease, stage 3 ([place])   . Colon polyps    benign  . Complication of anesthesia    hard to wake up  . Coronary artery disease    30% stenosis reported on previous cardiac catheterization in 2006. This was done in [facility]  . GERD (gastroesophageal reflux disease)   . Headaches, cluster   . Hyperlipidemia   . Hypertension   . Hypothyroidism   . Osteopenia 07/2014   DEXA 0.5 spine, 01.7 R femoral neck  . Pneumonia    years ago  . Secondary hyperparathyroidism of renal origin ([place])    likely  . Sensorineural hearing loss (SNHL) of both ears 01/27/2016   Audiology eval - moderately severe sensorineural hearing loss bilaterally rec amplification (07/2016 - [name] audiologist)  . Sleep apnea    on CPAP  . Thyroid disease   . Type 2 diabetes, uncontrolled, with neuropathy ([place])    followed by endo Dr Buddy Duty    Past Surgical History:  Procedure Laterality Date  . ABDOMINAL HYSTERECTOMY     heavy bleeding, ovaries remain  . APPENDECTOMY    . BREAST  BIOPSY Right    benign  . BREAST BIOPSY Left 02/2014   ?DCIS  . CARDIAC CATHETERIZATION  2006   [place], [place].   . CARDIAC CATHETERIZATION  10/2013   mild 2v nonobstructive disease, EF 60%  . CARDIOVASCULAR STRESS TEST  09/2012   WNL ([place])  . CHOLECYSTECTOMY    . COLONOSCOPY  05/2010   2 TAs, diverticulosis, rpt 2 yrs (Dr Elissa Hefty in [place])  . COLONOSCOPY  12/2014   1 TA, rpt 5 yrs Henrene Pastor)  . cortisone right knee  01/04/15  . GALLBLADDER SURGERY    . MASTECTOMY W/ SENTINEL NODE BIOPSY Left 06/07/2014   DR WAKEFIELD  . SIMPLE MASTECTOMY WITH AXILLARY SENTINEL NODE BIOPSY Left 06/07/2014   Procedure: LEFT TOTAL MASTECTOMY WITH LEFT AXILLARY SENTINEL NODE BIOPSY;  Surgeon: Rolm Bookbinder, MD;  Location: [place];  Service: General;  Laterality: Left;    There were no vitals filed for this visit.  Subjective Assessment - 11/10/17 1435    Subjective  pt states her shoulder is doing better, but it is "sore"  She thinks the soreness is from doing the increased exercises     Pertinent History  Left breast cancer diagnosed in October 2015 and had mastectomy that December with SLNB (4-5 nodes). Cancer was DCIS. Tried  estrogen blocker but couldn't tolerate side effects. Saw Dr. [name] for a routine follow-up and he referred her for therapy for limited motion for left shoulder.  Did have lymph node biopsy last year, but that was negative; area still bothers her. HTN under control with meds; diabetes not fully controlled, but has started a new medication. Hypothyroid, hyperlipidemia.    Patient Stated Goals  get the left arm where she can use it better--hold it up    Currently in Pain?  Yes         [facility] PT Assessment - 11/10/17 0001      Observation/Other Assessments   Observations  pt does not appear to have lymphedema in left lateral chest, or back     Skin Integrity  pt has red mark on left lateral chest where bra is rubbing into her with soft skin fullness ( appears to be fatty tissue)  just above it Pt instructed with visual cues at mirror to make sure that bra is adjusted to cover up this area                    [facility] Adult PT Treatment/Exercise - 11/10/17 0001      Lumbar Exercises: Quadruped   Madcat/Old Horse  5 reps;Limitations    Madcat/Old Horse Limitations  modified position with both hands in weight bearing on elevated table   pt had much difficutly with this and could not get thoracic extension    Straight Leg Raise  5 reps;Other (comment)    Straight Leg Raises Limitations  raised each leg with weight bearing through arms     Other Quadruped Lumbar Exercises  modified position with elevated table, partial push up       Shoulder Exercises: Supine   Protraction  AROM;Right;Left;20 reps    Flexion  AROM;Left;10 reps      Shoulder Exercises: Sidelying   External Rotation  AROM;Strengthening;Left;10 reps;Weights with folded towel at waist and cues to keep trunk positioned    External Rotation Weight (lbs)  2    ABduction  Strengthening;Right;10 reps;Weights small arc of motion to keep long lever arm for deltoid    ABduction Weight (lbs)  2    Other Sidelying Exercises  hand pointed to celining and small circles with 5 reps in each direction with close monitoring with verbal and tactile cues for smooth coordinated movment  manual assist to keep scapula stabalized       Shoulder Exercises: Standing   Protraction  AROM;Both;5 reps    Flexion  AROM;Right;Left;5 reps    ABduction  AROM;Right;Left;5 reps    Retraction  AROM;Both;5 reps      Manual Therapy   Manual Therapy  Manual Lymphatic Drainage (MLD);Passive ROM    Myofascial Release  to left axilla, pec majot area and chest using cross hands    Passive ROM  to left shoulder in direction of flexion, abduction and ER to pt's tolerance while having pt actively retract and depress scapula to avoid shoulder popping and discomfort in joint                  PT Long Term Goals - 11/06/17 0848       PT LONG TERM GOAL #1   Title  Pt. will be independent in HEP for left shoulder ROM and strengthening.    Time  4    Period  Weeks    Status  On-going      PT LONG TERM GOAL #2  Title  Pt. will have at least 130 degrees of left shoulder flexion for improved overhead reach.    Baseline  95 at eval compared to 139 on right., 11/06/17- 142    Time  4    Period  Weeks    Status  Achieved      PT LONG TERM GOAL #3   Title  Pt. will have at least 140 degrees of left shoulder abduction for improved ADLs.    Baseline  97 on left compared to 155 on right at eval, 11/06/17- 130    Time  4    Period  Weeks    Status  On-going      PT LONG TERM GOAL #4   Title  Pt. will report being able to hold up her left arm to fix her hair with at least 60% greater ease.    Time  4    Period  Weeks    Status  On-going            Plan - 11/10/17 1525    Clinical Impression Statement  Pt states she contiues to have soreness and "pulling" in her left chest and shoulder, but feels like she is gettting stronger.  She is due to get more bras next month and will get ones with larger lateral chest area to provide more support to the fullness she has there     PT Frequency  2x / week    PT Duration  4 weeks    PT Next Visit Plan  continues Progress with shoulder ROM and strengthening, assess indep w supine scap and update if needed.  Progress to [place] soon     PT Home Exercise Plan  supine scap    Consulted and Agree with Plan of Care  Patient       Patient will benefit from skilled therapeutic intervention in order to improve the following deficits and impairments:  Decreased range of motion, Decreased strength, Impaired UE functional use  Visit Diagnosis: Stiffness of left shoulder, not elsewhere classified  Aftercare following surgery for neoplasm  Muscle weakness (generalized)     Problem List Patient Active Problem List   Diagnosis Date Noted  . Nausea 01/26/2017  . Advanced  care planning/counseling discussion 01/27/2016  . Sensorineural hearing loss (SNHL) of both ears 01/27/2016  . Chest discomfort 01/27/2016  . Insomnia 02/13/2015  . Chronic cough 10/10/2014  . Osteopenia 07/31/2014  . S/P mastectomy 06/07/2014  . Genetic testing 06/02/2014  . Breast cancer of upper-outer quadrant of left female breast ([place]) 03/28/2014  . Grieving 10/25/2013  . Secondary hyperparathyroidism of renal origin ([place])   . Medicare annual wellness visit, subsequent 06/28/2013  . Diabetes mellitus with stage 3 chronic kidney disease ([place])   . Hypothyroidism 04/13/2013  . GERD (gastroesophageal reflux disease)   . Essential hypertension, benign 09/17/2012  . HLD (hyperlipidemia) 09/17/2012  . Type 2 diabetes, uncontrolled, with neuropathy ([place]) 02/15/2012  . OSA on CPAP 02/13/2012  . Coronary artery disease 02/13/2012   Donato Heinz. Owens Shark PT  Norwood Levo 11/10/2017, 3:28 PM  [place], [place], [address] Phone: [phone]   Fax:  [phone]  Name: Julia Banks MRN: [id number] Date of Birth: [birth year]

## 2017-11-13 ENCOUNTER — Ambulatory Visit: Payer: Medicare Other | Admitting: Physical Therapy

## 2017-11-13 DIAGNOSIS — M25612 Stiffness of left shoulder, not elsewhere classified: Secondary | ICD-10-CM

## 2017-11-13 DIAGNOSIS — M6281 Muscle weakness (generalized): Secondary | ICD-10-CM | POA: Diagnosis not present

## 2017-11-13 DIAGNOSIS — Z483 Aftercare following surgery for neoplasm: Secondary | ICD-10-CM

## 2017-11-13 NOTE — Therapy (Signed)
[facility] [address] [place], [place], [address] Phone: [phone]   Fax:  [phone]  Physical Therapy Treatment  Patient Details  Name: Julia Banks MRN: [id number] Date of Birth: 03/01/1941 Referring Provider: Dr. Wakefield   Encounter Date: 11/13/2017  PT End of Session - 11/13/17 0924    Visit Number  5    Number of Visits  9    Date for PT Re-Evaluation  11/27/17       Past Medical History:  Diagnosis Date  . Arthritis   . Asthma   . Breast cancer, left breast (HCC) 02/2014   DCIS, ER+ PR- initially rec bilat mastectomy with sentinal LN biopsy L axilla but now planning just L mastectomy ([place])  . Chronic kidney disease, stage 3 (HCC)   . Colon polyps    benign  . Complication of anesthesia    hard to wake up  . Coronary artery disease    30% stenosis reported on previous cardiac catheterization in 2006. This was done in [place]  . GERD (gastroesophageal reflux disease)   . Headaches, cluster   . Hyperlipidemia   . Hypertension   . Hypothyroidism   . Osteopenia 07/2014   DEXA 0.5 spine, 01.7 R femoral neck  . Pneumonia    years ago  . Secondary hyperparathyroidism of renal origin (HCC)    likely  . Sensorineural hearing loss (SNHL) of both ears 01/27/2016   Audiology eval - moderately severe sensorineural hearing loss bilaterally rec amplification (07/2016 - [facility] audiologist)  . Sleep apnea    on CPAP  . Thyroid disease   . Type 2 diabetes, uncontrolled, with neuropathy (HCC)    followed by endo Dr Kerr    Past Surgical History:  Procedure Laterality Date  . ABDOMINAL HYSTERECTOMY     heavy bleeding, ovaries remain  . APPENDECTOMY    . BREAST BIOPSY Right    benign  . BREAST BIOPSY Left 02/2014   ?DCIS  . CARDIAC CATHETERIZATION  2006   [place]   . CARDIAC CATHETERIZATION  10/2013   mild 2v nonobstructive disease, EF 60%  .  CARDIOVASCULAR STRESS TEST  09/2012   WNL ([place])  . CHOLECYSTECTOMY    . COLONOSCOPY  05/2010   2 TAs, diverticulosis, rpt 2 yrs (Dr Scholz in [place])  . COLONOSCOPY  12/2014   1 TA, rpt 5 yrs ([place])  . cortisone right knee  01/04/15  . GALLBLADDER SURGERY    . MASTECTOMY W/ SENTINEL NODE BIOPSY Left 06/07/2014   DR WAKEFIELD  . SIMPLE MASTECTOMY WITH AXILLARY SENTINEL NODE BIOPSY Left 06/07/2014   Procedure: LEFT TOTAL MASTECTOMY WITH LEFT AXILLARY SENTINEL NODE BIOPSY;  Surgeon: Matthew Wakefield, MD;  Location: [facility];  Service: General;  Laterality: Left;    There were no vitals filed for this visit.  Subjective Assessment - 11/13/17 0836    Subjective  Everything's going like normal. Says she is doing the exercises, but not faithfully since Tuesday, but says she will get back to it because she thinks it's really helping.    Pertinent History  Left breast cancer diagnosed in October 2015 and had mastectomy that December with SLNB (4-5 nodes). Cancer was DCIS. Tried estrogen blocker but couldn't tolerate side effects. Saw Dr. Wakefield for a routine follow-up and he referred her for therapy for limited motion for left shoulder.  Did have lymph node biopsy last year, but that was negative; area still bothers   her. HTN under control with meds; diabetes not fully controlled, but has started a new medication. Hypothyroid, hyperlipidemia.    Patient Stated Goals  get the left arm where she can use it better--hold it up    Currently in Pain?  No/denies         [facility] PT Assessment - 11/13/17 0001      AROM   Left Shoulder ABduction  106 Degrees                   [facility] Adult PT Treatment/Exercise - 11/13/17 0001      Shoulder Exercises: Supine   Other Supine Exercises  reviewed supine scapular series with yellow Theraband x 5 reps each pt. needed cueing for correct technique    Other Supine Exercises  left shoulder in approx. 90, 105, 120 degrees flexion with patient and  therapist working together on slow reversal holds      Shoulder Exercises: Seated   Other Seated Exercises  backward shoulder rolls at end of session pt. says she does these a lot      Shoulder Exercises: Pulleys   Flexion  2 minutes    Flexion Limitations  verbal cues to keep shoulder down and body from leaning    ABduction  2 minutes verbal cues to keep shoulder down       Shoulder Exercises: Therapy Ball   Flexion  Both;5 reps    ABduction  Left;5 reps      Manual Therapy   Manual Therapy  Scapular mobilization    Myofascial Release  left UE myofascial pulling with movement into abduction and prolonged stretch there    Scapular Mobilization  to left side in right sidelying with rhythmic movement into protraction and depression pt. very tight and/or with difficulty relaxing    Passive ROM  in supine to left shoulder with stretch to tolerance into er, abduction and flexion no popping felt or heard today                  PT Long Term Goals - 11/13/17 0837      PT LONG TERM GOAL #1   Title  Pt. will be independent in HEP for left shoulder ROM and strengthening.    Status  On-going      PT LONG TERM GOAL #2   Title  Pt. will have at least 130 degrees of left shoulder flexion for improved overhead reach.    Baseline  95 at eval compared to 139 on right., 11/06/17- 142    Status  Achieved      PT LONG TERM GOAL #3   Title  Pt. will have at least 140 degrees of left shoulder abduction for improved ADLs.    Baseline  97 on left compared to 155 on right at eval, 11/06/17- 130      PT LONG TERM GOAL #4   Title  Pt. will report being able to hold up her left arm to fix her hair with at least 60% greater ease.    Baseline  50% improved as of 11/13/17 (she got her hair up on top of her head this morning)    Status  Partially Met            Plan - 11/13/17 0924    Clinical Impression Statement  Pt. did well with prolonged manual stretches today, and still does have  significant tightness, especially with left shoulder abduction (er and flexion doing pretty well). No popping felt  today. Pt. has also got tightness and/or difficulty relaxing scapular muscles.    Rehab Potential  Good    PT Frequency  2x / week    PT Duration  4 weeks    PT Treatment/Interventions  ADLs/Self Care Home Management;Therapeutic exercise;Patient/family education;Manual techniques;Passive range of motion;Scar mobilization    PT Next Visit Plan  Continue manual work for shoulder ROM (especially abduction) and scapular mobility. Also continue isometrics (slow reversal holds) invarying degrees of left shoulder flexion. Progress to [place] once patient has been doing current HEP regularly.    PT Home Exercise Plan  supine scap, supine dowel    Consulted and Agree with Plan of Care  Patient       Patient will benefit from skilled therapeutic intervention in order to improve the following deficits and impairments:  Decreased range of motion, Decreased strength, Impaired UE functional use  Visit Diagnosis: Stiffness of left shoulder, not elsewhere classified  Aftercare following surgery for neoplasm  Muscle weakness (generalized)     Problem List Patient Active Problem List   Diagnosis Date Noted  . Nausea 01/26/2017  . Advanced care planning/counseling discussion 01/27/2016  . Sensorineural hearing loss (SNHL) of both ears 01/27/2016  . Chest discomfort 01/27/2016  . Insomnia 02/13/2015  . Chronic cough 10/10/2014  . Osteopenia 07/31/2014  . S/P mastectomy 06/07/2014  . Genetic testing 06/02/2014  . Breast cancer of upper-outer quadrant of left female breast ([place]) 03/28/2014  . Grieving 10/25/2013  . Secondary hyperparathyroidism of renal origin ([place])   . Medicare annual wellness visit, subsequent 06/28/2013  . Diabetes mellitus with stage 3 chronic kidney disease ([place])   . Hypothyroidism 04/13/2013  . GERD (gastroesophageal reflux disease)   . Essential  hypertension, benign 09/17/2012  . HLD (hyperlipidemia) 09/17/2012  . Type 2 diabetes, uncontrolled, with neuropathy ([place]) 02/15/2012  . OSA on CPAP 02/13/2012  . Coronary artery disease 02/13/2012    SALISBURY,DONNA 11/13/2017, 9:27 AM  [place], [place], [address] Phone: [phone]   Fax:  [phone]  Name: RAEA MAGALLON MRN: [id number] Date of Birth: Sep 07, 1940  Serafina Royals, PT 11/13/17 9:28 AM

## 2017-11-17 ENCOUNTER — Ambulatory Visit: Payer: Medicare Other | Admitting: Physical Therapy

## 2017-11-17 DIAGNOSIS — M6281 Muscle weakness (generalized): Secondary | ICD-10-CM | POA: Diagnosis not present

## 2017-11-17 DIAGNOSIS — M25612 Stiffness of left shoulder, not elsewhere classified: Secondary | ICD-10-CM

## 2017-11-17 DIAGNOSIS — Z483 Aftercare following surgery for neoplasm: Secondary | ICD-10-CM | POA: Diagnosis not present

## 2017-11-17 NOTE — Therapy (Signed)
[place], [place], [address] Phone: [phone]   Fax:  [phone]  Physical Therapy Treatment  Patient Details  Name: Julia Banks MRN: [id number] Date of Birth: 1940/07/17 Referring Provider: Dr. [name]   Encounter Date: 11/17/2017  PT End of Session - 11/17/17 1627    Visit Number  6    Date for PT Re-Evaluation  11/27/17    PT Start Time  5056    PT Stop Time  1514    PT Time Calculation (min)  40 min    Activity Tolerance  Patient tolerated treatment well    Behavior During Therapy  [facility] for tasks assessed/performed       Past Medical History:  Diagnosis Date  . Arthritis   . Asthma   . Breast cancer, left breast ([place]) 02/2014   DCIS, ER+ PR- initially rec bilat mastectomy with sentinal LN biopsy L axilla but now planning just L mastectomy [name])  . Chronic kidney disease, stage 3 ([place])   . Colon polyps    benign  . Complication of anesthesia    hard to wake up  . Coronary artery disease    30% stenosis reported on previous cardiac catheterization in 2006. This was done in [facility]  . GERD (gastroesophageal reflux disease)   . Headaches, cluster   . Hyperlipidemia   . Hypertension   . Hypothyroidism   . Osteopenia 07/2014   DEXA 0.5 spine, 01.7 R femoral neck  . Pneumonia    years ago  . Secondary hyperparathyroidism of renal origin ([place])    likely  . Sensorineural hearing loss (SNHL) of both ears 01/27/2016   Audiology eval - moderately severe sensorineural hearing loss bilaterally rec amplification (07/2016 - [facility] [name] audiologist)  . Sleep apnea    on CPAP  . Thyroid disease   . Type 2 diabetes, uncontrolled, with neuropathy ([place])    followed by endo Dr Buddy Duty    Past Surgical History:  Procedure Laterality Date  . ABDOMINAL HYSTERECTOMY     heavy bleeding, ovaries remain  . APPENDECTOMY    . BREAST BIOPSY Right    benign  .  BREAST BIOPSY Left 02/2014   ?DCIS  . CARDIAC CATHETERIZATION  2006   [place].   . CARDIAC CATHETERIZATION  10/2013   mild 2v nonobstructive disease, EF 60%  . CARDIOVASCULAR STRESS TEST  09/2012   WNL ([place])  . CHOLECYSTECTOMY    . COLONOSCOPY  05/2010   2 TAs, diverticulosis, rpt 2 yrs (Dr Elissa Hefty in [place])  . COLONOSCOPY  12/2014   1 TA, rpt 5 yrs Henrene Pastor)  . cortisone right knee  01/04/15  . GALLBLADDER SURGERY    . MASTECTOMY W/ SENTINEL NODE BIOPSY Left 06/07/2014   DR [place]FIELD  . SIMPLE MASTECTOMY WITH AXILLARY SENTINEL NODE BIOPSY Left 06/07/2014   Procedure: LEFT TOTAL MASTECTOMY WITH LEFT AXILLARY SENTINEL NODE BIOPSY;  Surgeon: Rolm Bookbinder, MD;  Location: [facility];  Service: General;  Laterality: Left;    There were no vitals filed for this visit.  Subjective Assessment - 11/17/17 1435    Subjective  It was a little sore after last session, but not too bad.  Doing okay today. Has been working on the exercises since last session--no questions. Re: arm function: "I can use it better."    Pertinent History  Left breast cancer diagnosed in October 2015 and had mastectomy that December with SLNB (4-5 nodes). Cancer  was DCIS. Tried estrogen blocker but couldn't tolerate side effects. Saw Dr. [name] for a routine follow-up and he referred her for therapy for limited motion for left shoulder.  Did have lymph node biopsy last year, but that was negative; area still bothers her. HTN under control with meds; diabetes not fully controlled, but has started a new medication. Hypothyroid, hyperlipidemia.    Currently in Pain?  No/denies                       [facility] Adult PT Treatment/Exercise - 11/17/17 0001      Shoulder Exercises: Sidelying   ABduction  AROM;Left;10 reps    Other Sidelying Exercises  in right sidelying, active left UE D2 motions to stretch anterior shoulder/chest x 5      Shoulder Exercises: Standing   ABduction  AAROM;Left 3 reps at finger  ladder      Shoulder Exercises: Pulleys   Flexion  2 minutes    Flexion Limitations  verbal cues to stay in line for flexion without compensation    ABduction  2 minutes cueing to stretch gently      Shoulder Exercises: Therapy Ball   Flexion  Both;10 reps cueing to stretch at top, slowly    ABduction  Left;10 reps      Manual Therapy   Myofascial Release  with left shoulder in abduction, one hand on left upper arm and the opposite at left flank for vertical stretch and at mid and left abdomen for diagonal stretches; left UE myofascial pulling with movement of left arm into abduction with prolonged stretch at end range    Scapular Mobilization  to left side in right sidelying with rhythmic movement into protraction and depression pt. very tight and/or with difficulty relaxing    Passive ROM  in supine to left shoulder with stretch to tolerance into er, abduction and flexion no popping felt or heard today                  PT Long Term Goals - 11/13/17 0837      PT LONG TERM GOAL #1   Title  Pt. will be independent in HEP for left shoulder ROM and strengthening.    Status  On-going      PT LONG TERM GOAL #2   Title  Pt. will have at least 130 degrees of left shoulder flexion for improved overhead reach.    Baseline  95 at eval compared to 139 on right., 11/06/17- 142    Status  Achieved      PT LONG TERM GOAL #3   Title  Pt. will have at least 140 degrees of left shoulder abduction for improved ADLs.    Baseline  97 on left compared to 155 on right at eval, 11/06/17- 130      PT LONG TERM GOAL #4   Title  Pt. will report being able to hold up her left arm to fix her hair with at least 60% greater ease.    Baseline  50% improved as of 11/13/17 (she got her hair up on top of her head this morning)    Status  Partially Met            Plan - 11/17/17 1628    Clinical Impression Statement  Pt. did well with manual and active stretches today, reporting she definitely  felt stretching with newer exercise. She reports she notes improvement, as she is now able to do some  things that she couldn't previously. Active left shoulder abduction goal has not been met.    Rehab Potential  Good    PT Frequency  2x / week    PT Duration  4 weeks    PT Treatment/Interventions  ADLs/Self Care Home Management;Therapeutic exercise;Patient/family education;Manual techniques;Passive range of motion;Scar mobilization    PT Next Visit Plan  Continue manual work for shoulder ROM (especially abduction) and scapular mobility. Also continue isometrics (slow reversal holds) invarying degrees of left shoulder flexion. Progress to [place] once patient has been doing current HEP regularly.    PT Home Exercise Plan  supine scap, supine dowel    Consulted and Agree with Plan of Care  Patient       Patient will benefit from skilled therapeutic intervention in order to improve the following deficits and impairments:  Decreased range of motion, Decreased strength, Impaired UE functional use  Visit Diagnosis: Stiffness of left shoulder, not elsewhere classified  Aftercare following surgery for neoplasm     Problem List Patient Active Problem List   Diagnosis Date Noted  . Nausea 01/26/2017  . Advanced care planning/counseling discussion 01/27/2016  . Sensorineural hearing loss (SNHL) of both ears 01/27/2016  . Chest discomfort 01/27/2016  . Insomnia 02/13/2015  . Chronic cough 10/10/2014  . Osteopenia 07/31/2014  . S/P mastectomy 06/07/2014  . Genetic testing 06/02/2014  . Breast cancer of upper-outer quadrant of left female breast ([place]) 03/28/2014  . Grieving 10/25/2013  . Secondary hyperparathyroidism of renal origin ([place])   . Medicare annual wellness visit, subsequent 06/28/2013  . Diabetes mellitus with stage 3 chronic kidney disease ([place])   . Hypothyroidism 04/13/2013  . GERD (gastroesophageal reflux disease)   . Essential hypertension, benign 09/17/2012  . HLD  (hyperlipidemia) 09/17/2012  . Type 2 diabetes, uncontrolled, with neuropathy ([place]) 02/15/2012  . OSA on CPAP 02/13/2012  . Coronary artery disease 02/13/2012    Davey Limas 11/17/2017, 4:30 PM  [place], [place], [address] Phone: [phone]   Fax:  [phone]  Name: Julia Banks MRN: [id number] Date of Birth: 1941-02-20  Serafina Royals, PT 11/17/17 4:30 PM

## 2017-11-20 ENCOUNTER — Ambulatory Visit: Payer: Medicare Other | Admitting: Physical Therapy

## 2017-11-20 DIAGNOSIS — M6281 Muscle weakness (generalized): Secondary | ICD-10-CM

## 2017-11-20 DIAGNOSIS — M25612 Stiffness of left shoulder, not elsewhere classified: Secondary | ICD-10-CM | POA: Diagnosis not present

## 2017-11-20 DIAGNOSIS — Z483 Aftercare following surgery for neoplasm: Secondary | ICD-10-CM

## 2017-11-20 NOTE — Therapy (Signed)
[place], [place], [address] Phone: [phone]   Fax:  [phone]  Physical Therapy Treatment  Patient Details  Name: Julia Banks MRN: [id number] Date of Birth: Apr 10, 1941 Referring Provider: Dr. [name]   Encounter Date: 11/20/2017  PT End of Session - 11/20/17 1231    Visit Number  8    Number of Visits  9    Date for PT Re-Evaluation  11/27/17       Past Medical History:  Diagnosis Date  . Arthritis   . Asthma   . Breast cancer, left breast ([place]) 02/2014   DCIS, ER+ PR- initially rec bilat mastectomy with sentinal LN biopsy L axilla but now planning just L mastectomy [name])  . Chronic kidney disease, stage 3 ([place])   . Colon polyps    benign  . Complication of anesthesia    hard to wake up  . Coronary artery disease    30% stenosis reported on previous cardiac catheterization in 2006. This was done in [facility]  . GERD (gastroesophageal reflux disease)   . Headaches, cluster   . Hyperlipidemia   . Hypertension   . Hypothyroidism   . Osteopenia 07/2014   DEXA 0.5 spine, 01.7 R femoral neck  . Pneumonia    years ago  . Secondary hyperparathyroidism of renal origin ([place])    likely  . Sensorineural hearing loss (SNHL) of both ears 01/27/2016   Audiology eval - moderately severe sensorineural hearing loss bilaterally rec amplification (07/2016 - [name] audiologist)  . Sleep apnea    on CPAP  . Thyroid disease   . Type 2 diabetes, uncontrolled, with neuropathy ([place])    followed by endo Dr Buddy Duty    Past Surgical History:  Procedure Laterality Date  . ABDOMINAL HYSTERECTOMY     heavy bleeding, ovaries remain  . APPENDECTOMY    . BREAST BIOPSY Right    benign  . BREAST BIOPSY Left 02/2014   ?DCIS  . CARDIAC CATHETERIZATION  2006   [place], [place].   . CARDIAC CATHETERIZATION  10/2013   mild 2v nonobstructive disease, EF 60%  .  CARDIOVASCULAR STRESS TEST  09/2012   WNL ([place])  . CHOLECYSTECTOMY    . COLONOSCOPY  05/2010   2 TAs, diverticulosis, rpt 2 yrs (Dr Elissa Hefty in [place])  . COLONOSCOPY  12/2014   1 TA, rpt 5 yrs Henrene Pastor)  . cortisone right knee  01/04/15  . GALLBLADDER SURGERY    . MASTECTOMY W/ SENTINEL NODE BIOPSY Left 06/07/2014   DR [place]  . SIMPLE MASTECTOMY WITH AXILLARY SENTINEL NODE BIOPSY Left 06/07/2014   Procedure: LEFT TOTAL MASTECTOMY WITH LEFT AXILLARY SENTINEL NODE BIOPSY;  Surgeon: Rolm Bookbinder, MD;  Location: [facility];  Service: General;  Laterality: Left;    There were no vitals filed for this visit.  Subjective Assessment - 11/20/17 0849    Subjective  "I've had better days. I didn't sleep good last night." Has some left shoulder soreness from last session; noticed some popping again last night.    Pertinent History  Left breast cancer diagnosed in October 2015 and had mastectomy that December with SLNB (4-5 nodes). Cancer was DCIS. Tried estrogen blocker but couldn't tolerate side effects. Saw Dr. [name] for a routine follow-up and he referred her for therapy for limited motion for left shoulder.  Did have lymph node biopsy last year, but that was negative; area still bothers her. HTN under control with  meds; diabetes not fully controlled, but has started a new medication. Hypothyroid, hyperlipidemia.    Currently in Pain?  No/denies    Pain Score  3     Pain Orientation  Left shoulder    Pain Descriptors / Indicators  Sore                       [facility] Adult PT Treatment/Exercise - 11/20/17 0001      Shoulder Exercises: Seated   Other Seated Exercises  backward shoulder rolls at end of session      Shoulder Exercises: Standing   Retraction  AROM;Both;10 reps    Other Standing Exercises  3-way arm raises x 10 each way with 1 lb. dumbbells    Other Standing Exercises  overhead press one arm at a time, alternating right and left, with 1 lb. weight x 10 each; then  hold left arm up toward ceiling and do small circles holding 1 lb. dumbbell x 30 seconds this was challenging; backward shoulder rolls after;      Shoulder Exercises: Pulleys   Flexion  2 minutes    Flexion Limitations  cueing to slow down, allow stretch at end range    ABduction  2 minutes cueing not to tilt body      Shoulder Exercises: Therapy Ball   Flexion  Both;10 reps cueing to lean in at top, slowly and with back straight      Shoulder Exercises: Stretch   Wall Stretch - ABduction  5 reps at finger ladder, left only      Manual Therapy   Myofascial Release  with left shoulder in abduction, one hand on left upper arm and the opposite at left flank for vertical stretch and at mid and left abdomen for diagonal stretches; left UE myofascial pulling with movement of left arm into abduction with prolonged stretch at end range    Passive ROM  in supine to left shoulder with stretch to tolerance into er, abduction and flexion                  PT Long Term Goals - 11/13/17 0837      PT LONG TERM GOAL #1   Title  Pt. will be independent in HEP for left shoulder ROM and strengthening.    Status  On-going      PT LONG TERM GOAL #2   Title  Pt. will have at least 130 degrees of left shoulder flexion for improved overhead reach.    Baseline  95 at eval compared to 139 on right., 11/06/17- 142    Status  Achieved      PT LONG TERM GOAL #3   Title  Pt. will have at least 140 degrees of left shoulder abduction for improved ADLs.    Baseline  97 on left compared to 155 on right at eval, 11/06/17- 130      PT LONG TERM GOAL #4   Title  Pt. will report being able to hold up her left arm to fix her hair with at least 60% greater ease.    Baseline  50% improved as of 11/13/17 (she got her hair up on top of her head this morning)    Status  Partially Met            Plan - 11/20/17 1233    Clinical Impression Statement  Pt. did well with adding more active assistive stretches  and some strengthening today. She had gotten  sore after last visit but had a good attitude about it, saying it showed she's improving.    Rehab Potential  Good    PT Frequency  2x / week    PT Duration  4 weeks    PT Treatment/Interventions  ADLs/Self Care Home Management;Therapeutic exercise;Patient/family education;Manual techniques;Passive range of motion;Scar mobilization    PT Next Visit Plan  Assess goals next time and renew or DC.     PT Home Exercise Plan  supine scap, supine dowel    Consulted and Agree with Plan of Care  Patient       Patient will benefit from skilled therapeutic intervention in order to improve the following deficits and impairments:  Decreased range of motion, Decreased strength, Impaired UE functional use  Visit Diagnosis: Stiffness of left shoulder, not elsewhere classified  Aftercare following surgery for neoplasm  Muscle weakness (generalized)     Problem List Patient Active Problem List   Diagnosis Date Noted  . Nausea 01/26/2017  . Advanced care planning/counseling discussion 01/27/2016  . Sensorineural hearing loss (SNHL) of both ears 01/27/2016  . Chest discomfort 01/27/2016  . Insomnia 02/13/2015  . Chronic cough 10/10/2014  . Osteopenia 07/31/2014  . S/P mastectomy 06/07/2014  . Genetic testing 06/02/2014  . Breast cancer of upper-outer quadrant of left female breast ([place]) 03/28/2014  . Grieving 10/25/2013  . Secondary hyperparathyroidism of renal origin ([place])   . Medicare annual wellness visit, subsequent 06/28/2013  . Diabetes mellitus with stage 3 chronic kidney disease ([place])   . Hypothyroidism 04/13/2013  . GERD (gastroesophageal reflux disease)   . Essential hypertension, benign 09/17/2012  . HLD (hyperlipidemia) 09/17/2012  . Type 2 diabetes, uncontrolled, with neuropathy ([place]) 02/15/2012  . OSA on CPAP 02/13/2012  . Coronary artery disease 02/13/2012    Amybeth Sieg 11/20/2017, 12:37 PM  [place], [place], [address] Phone: [phone]   Fax:  [phone]  Name: DOMINQUE LEVANDOWSKI MRN: [id number] Date of Birth: [birth year]  Serafina Royals, PT 11/20/17 12:37 PM

## 2017-11-25 ENCOUNTER — Ambulatory Visit: Payer: Medicare Other

## 2017-11-25 DIAGNOSIS — M25612 Stiffness of left shoulder, not elsewhere classified: Secondary | ICD-10-CM | POA: Diagnosis not present

## 2017-11-25 DIAGNOSIS — Z483 Aftercare following surgery for neoplasm: Secondary | ICD-10-CM | POA: Diagnosis not present

## 2017-11-25 DIAGNOSIS — M6281 Muscle weakness (generalized): Secondary | ICD-10-CM

## 2017-11-25 NOTE — Therapy (Addendum)
[place], [place], [address] Phone: [phone]   Fax:  [phone]  Physical Therapy Treatment  Patient Details  Name: Julia Banks MRN: [id number] Date of Birth: August 05, 1940 Referring Provider: Dr. [name]   Encounter Date: 11/25/2017  PT End of Session - 11/25/17 0827    Visit Number  9    Number of Visits  9    Date for PT Re-Evaluation  11/27/17    PT Start Time  0804    PT Stop Time  0848    PT Time Calculation (min)  44 min    Activity Tolerance  Patient tolerated treatment well    Behavior During Therapy  [facility] for tasks assessed/performed       Past Medical History:  Diagnosis Date  . Arthritis   . Asthma   . Breast cancer, left breast ([place]) 02/2014   DCIS, ER+ PR- initially rec bilat mastectomy with sentinal LN biopsy L axilla but now planning just L mastectomy [name])  . Chronic kidney disease, stage 3 ([place])   . Colon polyps    benign  . Complication of anesthesia    hard to wake up  . Coronary artery disease    30% stenosis reported on previous cardiac catheterization in 2006. This was done in [facility]  . GERD (gastroesophageal reflux disease)   . Headaches, cluster   . Hyperlipidemia   . Hypertension   . Hypothyroidism   . Osteopenia 07/2014   DEXA 0.5 spine, 01.7 R femoral neck  . Pneumonia    years ago  . Secondary hyperparathyroidism of renal origin ([place])    likely  . Sensorineural hearing loss (SNHL) of both ears 01/27/2016   Audiology eval - moderately severe sensorineural hearing loss bilaterally rec amplification (07/2016 - [name] audiologist)  . Sleep apnea    on CPAP  . Thyroid disease   . Type 2 diabetes, uncontrolled, with neuropathy ([place])    followed by endo Dr Buddy Duty    Past Surgical History:  Procedure Laterality Date  . ABDOMINAL HYSTERECTOMY     heavy bleeding, ovaries remain  . APPENDECTOMY    . BREAST  BIOPSY Right    benign  . BREAST BIOPSY Left 02/2014   ?DCIS  . CARDIAC CATHETERIZATION  2006   [place], [place].   . CARDIAC CATHETERIZATION  10/2013   mild 2v nonobstructive disease, EF 60%  . CARDIOVASCULAR STRESS TEST  09/2012   WNL ([place])  . CHOLECYSTECTOMY    . COLONOSCOPY  05/2010   2 TAs, diverticulosis, rpt 2 yrs (Dr Elissa Hefty in [place])  . COLONOSCOPY  12/2014   1 TA, rpt 5 yrs Henrene Pastor)  . cortisone right knee  01/04/15  . GALLBLADDER SURGERY    . MASTECTOMY W/ SENTINEL NODE BIOPSY Left 06/07/2014   DR WAKEFIELD  . SIMPLE MASTECTOMY WITH AXILLARY SENTINEL NODE BIOPSY Left 06/07/2014   Procedure: LEFT TOTAL MASTECTOMY WITH LEFT AXILLARY SENTINEL NODE BIOPSY;  Surgeon: Rolm Bookbinder, MD;  Location: [place];  Service: General;  Laterality: Left;    There were no vitals filed for this visit.  Subjective Assessment - 11/25/17 0806    Subjective  My Lt shoulder is doing well. It feels much, much better now! I do not feel limited with anything now. I had some burning in my Lt shoulder after last visit but I know it's because I used the muscles.    Pertinent History  Left breast cancer  diagnosed in October 2015 and had mastectomy that December with SLNB (4-5 nodes). Cancer was DCIS. Tried estrogen blocker but couldn't tolerate side effects. Saw Dr. [name] for a routine follow-up and he referred her for therapy for limited motion for left shoulder.  Did have lymph node biopsy last year, but that was negative; area still bothers her. HTN under control with meds; diabetes not fully controlled, but has started a new medication. Hypothyroid, hyperlipidemia.    Patient Stated Goals  get the left arm where she can use it better--hold it up    Currently in Pain?  No/denies         [facility] PT Assessment - 11/25/17 0001      AROM   Left Shoulder Flexion  149 Degrees    Left Shoulder ABduction  134 Degrees                   [facility] Adult PT Treatment/Exercise - 11/25/17 0001       Shoulder Exercises: Standing   ABduction  AAROM;Left;5 reps    Other Standing Exercises  3-way arm raises x 10 each way with 1 lb. dumbbells up against wall with care engaged fot flat back, shoulders and head agaisnt wall.reminding pt of correct technique throughout    Other Standing Exercises  overhead press one arm at a time, alternating right and left, with 1 lb. weight x 10 each; then hold left arm up toward ceiling and do small circles holding 1 lb. dumbbell x 30 seconds conts to feel challenging, backward shoulder rolls after      Shoulder Exercises: Pulleys   Flexion  2 minutes    Flexion Limitations  cueing to slow down, allow stretch at end range    ABduction  2 minutes;Limitations    ABduction Limitations  VCs to hold stetch at end ROM      Shoulder Exercises: Therapy Ball   Flexion  10 reps Pt returned correct demonstration with forward lean at top      Manual Therapy   Myofascial Release  with left shoulder in abduction, one hand on left upper arm and the opposite at left flank for vertical stretch and at mid and left abdomen for diagonal stretches; left UE myofascial pulling with movement of left arm into abduction with prolonged stretch at end range    Passive ROM  in supine to left shoulder with stretch to tolerance into er, abduction and flexion             PT Education - 11/25/17 1012    Education provided  Yes    Education Details  3 way raises    Person(s) Educated  Patient    Methods  Explanation;Demonstration;Handout    Comprehension  Verbalized understanding;Returned demonstration          PT Long Term Goals - 11/25/17 0824      PT LONG TERM GOAL #1   Title  Pt. will be independent in HEP for left shoulder ROM and strengthening.    Status  Achieved      PT LONG TERM GOAL #2   Title  Pt. will have at least 130 degrees of left shoulder flexion for improved overhead reach.    Baseline  95 at eval compared to 139 on right., 11/06/17- 142; 149 degrees  11/25/17    Status  Achieved      PT LONG TERM GOAL #3   Title  Pt. will have at least 140 degrees of left shoulder abduction  for improved ADLs.    Baseline  97 on left compared to 155 on right at eval, 11/06/17- 154 degrees-11/25/17    Status  Achieved      PT LONG TERM GOAL #4   Title  Pt. will report being able to hold up her left arm to fix her hair with at least 60% greater ease.    Baseline  50% improved as of 11/13/17 (she got her hair up on top of her head this morning); 90%greater ease with this now-11/25/17    Status  Achieved            Plan - 11/25/17 0848    Clinical Impression Statement  Pt has done excellent with therapy, met all her goals and does not feel limited with any of her ADLs. Will D/C at this time.     Rehab Potential  Good    PT Frequency  2x / week    PT Duration  4 weeks    PT Treatment/Interventions  ADLs/Self Care Home Management;Therapeutic exercise;Patient/family education;Manual techniques;Passive range of motion;Scar mobilization    PT Next Visit Plan  D/C this visit.    Consulted and Agree with Plan of Care  Patient       Patient will benefit from skilled therapeutic intervention in order to improve the following deficits and impairments:  Decreased range of motion, Decreased strength, Impaired UE functional use  Visit Diagnosis: Stiffness of left shoulder, not elsewhere classified  Aftercare following surgery for neoplasm  Muscle weakness (generalized)     Problem List Patient Active Problem List   Diagnosis Date Noted  . Nausea 01/26/2017  . Advanced care planning/counseling discussion 01/27/2016  . Sensorineural hearing loss (SNHL) of both ears 01/27/2016  . Chest discomfort 01/27/2016  . Insomnia 02/13/2015  . Chronic cough 10/10/2014  . Osteopenia 07/31/2014  . S/P mastectomy 06/07/2014  . Genetic testing 06/02/2014  . Breast cancer of upper-outer quadrant of left female breast ([place]) 03/28/2014  . Grieving 10/25/2013  .  Secondary hyperparathyroidism of renal origin ([place])   . Medicare annual wellness visit, subsequent 06/28/2013  . Diabetes mellitus with stage 3 chronic kidney disease ([place])   . Hypothyroidism 04/13/2013  . GERD (gastroesophageal reflux disease)   . Essential hypertension, benign 09/17/2012  . HLD (hyperlipidemia) 09/17/2012  . Type 2 diabetes, uncontrolled, with neuropathy ([place]) 02/15/2012  . OSA on CPAP 02/13/2012  . Coronary artery disease 02/13/2012    Otelia Limes, PTA 11/25/2017, 10:12 AM  [place], [place], [address] Phone: [phone]   Fax:  [phone]  Name: TYLIAH SCHLERETH MRN: [id number] Date of Birth: 1941/06/15  PHYSICAL THERAPY DISCHARGE SUMMARY  Visits from Start of Care: 9  Current functional level related to goals / functional outcomes: Goals met as noted above.   Remaining deficits: Some tightness, but patient does not feel this limits her function.   Education / Equipment: Home exercise program for stretching. Plan: Patient agrees to discharge.  Patient goals were met. Patient is being discharged due to meeting the stated rehab goals.  ?????    Serafina Royals, PT 11/25/17 3:40 PM

## 2017-11-25 NOTE — Patient Instructions (Signed)

## 2017-11-27 ENCOUNTER — Encounter: Payer: Medicare Other | Admitting: Physical Therapy

## 2017-12-12 ENCOUNTER — Other Ambulatory Visit: Payer: Self-pay | Admitting: Family Medicine

## 2018-01-08 ENCOUNTER — Other Ambulatory Visit: Payer: Self-pay | Admitting: Family Medicine

## 2018-02-10 DIAGNOSIS — L578 Other skin changes due to chronic exposure to nonionizing radiation: Secondary | ICD-10-CM | POA: Diagnosis not present

## 2018-02-10 DIAGNOSIS — Z872 Personal history of diseases of the skin and subcutaneous tissue: Secondary | ICD-10-CM | POA: Diagnosis not present

## 2018-02-10 DIAGNOSIS — Z86018 Personal history of other benign neoplasm: Secondary | ICD-10-CM | POA: Diagnosis not present

## 2018-02-10 DIAGNOSIS — Z1283 Encounter for screening for malignant neoplasm of skin: Secondary | ICD-10-CM | POA: Diagnosis not present

## 2018-03-03 ENCOUNTER — Other Ambulatory Visit: Payer: Self-pay | Admitting: Family Medicine

## 2018-03-05 MED ORDER — LOSARTAN POTASSIUM 50 MG PO TABS: 50.0000 mg | ORAL_TABLET | Freq: Every day | ORAL | 0 refills | Status: DC

## 2018-03-05 MED ORDER — LEVOTHYROXINE SODIUM 25 MCG PO TABS: 25.0000 ug | ORAL_TABLET | Freq: Every day | ORAL | 0 refills | Status: DC

## 2018-03-05 NOTE — Telephone Encounter (Signed)
We haven't filled this before.

## 2018-03-05 NOTE — Telephone Encounter (Signed)
Doxycycline Last rx:  12/01/13 Last OV:  03/30/17 Next OV:  04/09/18, CPE

## 2018-04-06 ENCOUNTER — Other Ambulatory Visit (INDEPENDENT_AMBULATORY_CARE_PROVIDER_SITE_OTHER): Payer: Medicare Other

## 2018-04-06 ENCOUNTER — Other Ambulatory Visit: Payer: Self-pay | Admitting: Family Medicine

## 2018-04-06 DIAGNOSIS — IMO0002 Reserved for concepts with insufficient information to code with codable children: Secondary | ICD-10-CM

## 2018-04-06 DIAGNOSIS — E1140 Type 2 diabetes mellitus with diabetic neuropathy, unspecified: Secondary | ICD-10-CM

## 2018-04-06 DIAGNOSIS — E1165 Type 2 diabetes mellitus with hyperglycemia: Principal | ICD-10-CM

## 2018-04-06 DIAGNOSIS — E785 Hyperlipidemia, unspecified: Secondary | ICD-10-CM | POA: Diagnosis not present

## 2018-04-06 DIAGNOSIS — E039 Hypothyroidism, unspecified: Secondary | ICD-10-CM

## 2018-04-06 DIAGNOSIS — N2581 Secondary hyperparathyroidism of renal origin: Secondary | ICD-10-CM

## 2018-04-06 LAB — LIPID PANEL
Cholesterol: 222 mg/dL — ABNORMAL HIGH (ref 0–200)
HDL: 37.6 mg/dL — ABNORMAL LOW (ref >39.00)
LDL CALC: 156 mg/dL — AB (ref 0–99)
NONHDL: 184.58
Total CHOL/HDL Ratio: 6
Triglycerides: 144 mg/dL (ref 0.0–149.0)
VLDL: 28.8 mg/dL (ref 0.0–40.0)

## 2018-04-06 LAB — COMPREHENSIVE METABOLIC PANEL
ALT: 14 U/L (ref 0–35)
AST: 17 U/L (ref 0–37)
Albumin: 4.1 g/dL (ref 3.5–5.2)
Alkaline Phosphatase: 90 U/L (ref 39–117)
BUN: 20 mg/dL (ref 6–23)
CHLORIDE: 103 meq/L (ref 96–112)
CO2: 28 mEq/L (ref 19–32)
Calcium: 9.7 mg/dL (ref 8.4–10.5)
Creatinine, Ser: 1.35 mg/dL — ABNORMAL HIGH (ref 0.40–1.20)
GFR: 40.62 mL/min — ABNORMAL LOW (ref >60.00)
GLUCOSE: 221 mg/dL — AB (ref 70–99)
POTASSIUM: 4.3 meq/L (ref 3.5–5.1)
SODIUM: 138 meq/L (ref 135–145)
Total Bilirubin: 0.7 mg/dL (ref 0.2–1.2)
Total Protein: 7.3 g/dL (ref 6.0–8.3)

## 2018-04-06 LAB — TSH: TSH: 4.47 u[IU]/mL (ref 0.35–4.50)

## 2018-04-06 LAB — HEMOGLOBIN A1C: Hgb A1c MFr Bld: 8.1 % — ABNORMAL HIGH (ref 4.6–6.5)

## 2018-04-06 LAB — T4, FREE: FREE T4: 1.08 ng/dL (ref 0.60–1.60)

## 2018-04-07 LAB — PARATHYROID HORMONE, INTACT (NO CA): PTH: 72 pg/mL — ABNORMAL HIGH (ref 14–64)

## 2018-04-08 ENCOUNTER — Other Ambulatory Visit: Payer: Self-pay | Admitting: Family Medicine

## 2018-04-08 ENCOUNTER — Encounter: Payer: Self-pay | Admitting: Family Medicine

## 2018-04-08 NOTE — Assessment & Plan Note (Signed)

## 2018-04-08 NOTE — Progress Notes (Signed)
BP (!) 158/82   Pulse 85   Temp 98.4 F (36.9 C) (Oral)   Ht 5' 5.25" (1.657 m)   Wt 190 lb 4 oz (86.3 kg)   SpO2 97%   BMI 31.42 kg/m   Elevated on recheck  CC: AMW Subjective:    Patient ID: Julia Banks, female    DOB: 1940/12/18, 77 y.o.   MRN: [id number]  HPI: Julia Banks is a 77 y.o. female presenting on 04/09/2018 for Annual Exam (Medicare)   Home BP running well controlled.  DM followed by endo Dr Buddy Duty.  Did not see Katha Cabal this year.  Wears hearing aides Completed vision screen 04/2017. Passes fall and depression screen   Preventative: COLONOSCOPY 7/20161 TA, rpt 5 yrs Henrene Pastor)  Well woman - s/p hysterectomy for benign reason.Last well woman exam with Dr. Helane Rima 03/2017, seen yearly.  Breast cancer - s/p L mastectomy 2015 - released from care of Dr Lindi Adie. Unable to tolerate anastrazole, sees Dr [name] regularly. mammo 05/2017 - abnormal R rec biopsy, no further records available - pt states rpt biopsy stable, upcoming mammogram 04/2018. DEXA 2016 - through Dr Christen Butter office. Osteopenia - 0.5 spine, -1.7 R femoral neck Flu shot - declines  Tdap 2007  prevnar 2006, pneumovax 2014 Shingles shot - not currently interested due to cost. Discussed shingrix.  Advanced directives: has this at home. HC proxy would be niece [name] [place]. Aasked to bring me copy  Seat belt use discussed Sunscreen use discussed. No changing moles on skin. Sees derm. Non smoker Alcohol - none Dentist Q6 mo Eye exam yearly  Widow, husband Fritz Pickerel passed 2015. Has 3 dogs  No children. Niece [name] supportive 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ist, nutritional Edu: 1 yr college Activity: stationary bicycle, some walking, yardwork Diet: limited water intake, good fruits/vegetables daily  Relevant past medical, surgical, family and social history reviewed and updated as indicated. Interim medical history since our last visit  reviewed. Allergies and medications reviewed and updated. Outpatient Medications Prior to Visit  Medication Sig Dispense Refill  . albuterol (PROVENTIL HFA;VENTOLIN HFA) 108 (90 BASE) MCG/ACT inhaler Inhale into the lungs every 6 (six) hours as needed for wheezing or shortness of breath.    . Ascorbic Acid (VITAMIN C WITH ROSE HIPS) 500 MG tablet Take 500 mg by mouth daily.    . Cholecalciferol (VITAMIN D PO) Take 5,000 Units by mouth.    . diphenhydrAMINE (BENADRYL) 12.5 MG chewable tablet Chew 12.5 mg by mouth at bedtime as needed for sleep.    Marland Kitchen docusate sodium (COLACE) 100 MG capsule Take 100 mg by mouth 2 (two) times daily.    Marland Kitchen doxycycline (VIBRAMYCIN) 100 MG capsule Take 100 mg by mouth daily as needed (FOR FACE).     . Fluticasone-Salmeterol (ADVAIR) 250-50 MCG/DOSE AEPB Inhale 1 puff into the lungs 2 (two) times daily.    . Insulin Glargine (LANTUS SOLOSTAR) 100 UNIT/ML Solostar Pen Inject 56 Units into the skin daily with breakfast. 15 mL   . KLOR-CON M20 20 MEQ tablet TAKE 1 TABLET (20 MEQ TOTAL) BY MOUTH DAILY. 90 tablet 2  . Omega-3 Fatty Acids (OMEGA 3 PO) Take by mouth. 180 EPA/120DHA    . OVER THE COUNTER MEDICATION Calcium magnesium 1000mg  day    . OVER THE COUNTER MEDICATION Women's multivitamin with iron    . vitamin B-12 (CYANOCOBALAMIN) 1000 MCG tablet Take 1,000 mcg by mouth daily.    . furosemide (LASIX) 20 MG tablet  Take 0.5 tablets (10 mg total) by mouth daily as needed for edema. 45 tablet 3  . Insulin Glargine (LANTUS SOLOSTAR) 100 UNIT/ML Solostar Pen Inject 58 Units into the skin daily with breakfast.     . levothyroxine (SYNTHROID, LEVOTHROID) 25 MCG tablet Take 1 tablet (25 mcg total) by mouth daily before breakfast. TAKE 1 TABLET BY MOUTH DAILY. EXCEPT 2 TABLETS ON MON, WED, AND FRI 130 tablet 0  . levothyroxine (SYNTHROID, LEVOTHROID) 25 MCG tablet TAKE 1 TABLET BY MOUTH DAILY. EXCEPT 2 TABLETS ON MON, WED, AND FRI 130 tablet 2  . losartan (COZAAR) 50 MG tablet  Take 1 tablet (50 mg total) by mouth daily. 90 tablet 0  . metoprolol succinate (TOPROL-XL) 50 MG 24 hr tablet TAKE 1 TABLET (50 MG TOTAL) BY MOUTH DAILY. 90 tablet 3  . pantoprazole (PROTONIX) 40 MG tablet TAKE 1 TABLET BY MOUTH EVERY DAY 30 tablet 6  . pravastatin (PRAVACHOL) 40 MG tablet Take 1 tablet (40 mg total) by mouth daily. 90 tablet 1  . Dulaglutide (TRULICITY) 5.05 LZ/7.6BH SOPN Inject into the skin once a week.     No facility-administered medications prior to visit.      Per HPI unless specifically indicated in ROS section below Review of Systems     Objective:    BP (!) 158/82   Pulse 85   Temp 98.4 F (36.9 C) (Oral)   Ht 5' 5.25" (1.657 m)   Wt 190 lb 4 oz (86.3 kg)   SpO2 97%   BMI 31.42 kg/m   Wt Readings from Last 3 Encounters:  04/09/18 190 lb 4 oz (86.3 kg)  03/30/17 199 lb 4 oz (90.4 kg)  01/26/17 198 lb 8 oz (90 kg)    Physical Exam  Constitutional: She is oriented to person, place, and time. She appears well-developed and well-nourished. No distress.  HENT:  Head: Normocephalic and atraumatic.  Right Ear: Hearing, tympanic membrane, external ear and ear canal normal.  Left Ear: Hearing, tympanic membrane, external ear and ear canal normal.  Nose: Nose normal.  Mouth/Throat: Uvula is midline, oropharynx is clear and moist and mucous membranes are normal. No oropharyngeal exudate, posterior oropharyngeal edema or posterior oropharyngeal erythema.  Eyes: Pupils are equal, round, and reactive to light. Conjunctivae and EOM are normal. No scleral icterus.  Neck: Normal range of motion. Neck supple. Carotid bruit is not present. No thyromegaly present.  Cardiovascular: Normal rate, regular rhythm, normal heart sounds and intact distal pulses.  No murmur heard. Pulses:      Radial pulses are 2+ on the right side, and 2+ on the left side.  Pulmonary/Chest: Effort normal and breath sounds normal. No respiratory distress. She has no wheezes. She has no rales.   Abdominal: Soft. Bowel sounds are normal. She exhibits no distension and no mass. There is no tenderness. There is no rebound and no guarding.  Musculoskeletal: Normal range of motion. She exhibits no edema.  Lymphadenopathy:    She has no cervical adenopathy.  Neurological: She is alert and oriented to person, place, and time.  CN grossly intact, station and gait intact Recall 3/3 Calculation 4/5 serial 3s  Skin: Skin is warm and dry. No rash noted.  Psychiatric: She has a normal mood and affect. Her behavior is normal. Judgment and thought content normal.  Nursing note and vitals reviewed.  Results for orders placed or performed in visit on 04/06/18  T4, free  Result Value Ref Range   Free T4 1.08  0.60 - 1.60 ng/dL  Parathyroid hormone, intact (no Ca)  Result Value Ref Range   PTH 72 (H) 14 - 64 pg/mL  Hemoglobin A1c  Result Value Ref Range   Hgb A1c MFr Bld 8.1 (H) 4.6 - 6.5 %  TSH  Result Value Ref Range   TSH 4.47 0.35 - 4.50 uIU/mL  Lipid panel  Result Value Ref Range   Cholesterol 222 (H) 0 - 200 mg/dL   Triglycerides 144.0 0.0 - 149.0 mg/dL   HDL 37.60 (L) >39.00 mg/dL   VLDL 28.8 0.0 - 40.0 mg/dL   LDL Cholesterol 156 (H) 0 - 99 mg/dL   Total CHOL/HDL Ratio 6    NonHDL 184.58   Comprehensive metabolic panel  Result Value Ref Range   Sodium 138 135 - 145 mEq/L   Potassium 4.3 3.5 - 5.1 mEq/L   Chloride 103 96 - 112 mEq/L   CO2 28 19 - 32 mEq/L   Glucose, Bld 221 (H) 70 - 99 mg/dL   BUN 20 6 - 23 mg/dL   Creatinine, Ser 1.35 (H) 0.40 - 1.20 mg/dL   Total Bilirubin 0.7 0.2 - 1.2 mg/dL   Alkaline Phosphatase 90 39 - 117 U/L   AST 17 0 - 37 U/L   ALT 14 0 - 35 U/L   Total Protein 7.3 6.0 - 8.3 g/dL   Albumin 4.1 3.5 - 5.2 g/dL   Calcium 9.7 8.4 - 10.5 mg/dL   GFR 40.62 (L) >60.00 mL/min      Assessment & Plan:   Problem List Items Addressed This Visit    Type 2 diabetes, uncontrolled, with neuropathy (HCC)    Chronic, A1c not at goal. Encouraged f/u with  endo.       Relevant Medications   Insulin Glargine (LANTUS SOLOSTAR) 100 UNIT/ML Solostar Pen   losartan (COZAAR) 50 MG tablet   pravastatin (PRAVACHOL) 40 MG tablet   Sensorineural hearing loss (SNHL) of both ears    Wears hearing aides.       Secondary hyperparathyroidism of renal origin ([place])    PTH elevated - will watch for now.       Osteopenia    Continue vit D daily.       Medicare annual wellness visit, subsequent - Primary    I have personally reviewed the Medicare Annual Wellness questionnaire and have noted 1. The patient's medical and social history 2. Their use of alcohol, tobacco or illicit drugs 3. Their current medications and supplements 4. The patient's functional ability including ADL's, fall risks, home safety risks and hearing or visual impairment. Cognitive function has been assessed and addressed as indicated.  5. Diet and physical activity 6. Evidence for depression or mood disorders The patients weight, height, BMI have been recorded in the chart. I have made referrals, counseling and provided education to the patient based on review of the above and I have provided the pt with a written personalized care plan for preventive services. Provider list updated.. See scanned questionairre as needed for further documentation. Reviewed preventative protocols and updated unless pt declined.       Hypothyroidism    Chronic, stable on current regimen - refilled today.       Relevant Medications   levothyroxine (SYNTHROID, LEVOTHROID) 25 MCG tablet   metoprolol succinate (TOPROL-XL) 50 MG 24 hr tablet   HLD (hyperlipidemia)    Chronic, LDL above goal. Intolerant to statins in the past (crestor, lipitor, pravastatin, lovastatin). Continue pravastatin - seems to be  tolerating well at this time. The 10-year ASCVD risk score Mikey Bussing DC Brooke Bonito., et al., 2013) is: 50.5%   Values used to calculate the score:     Age: 41 years     Sex: Female     Is Non-Hispanic  African American: No     Diabetic: Yes     Tobacco smoker: No     Systolic Blood Pressure: 244 mmHg     Is BP treated: Yes     HDL Cholesterol: 37.6 mg/dL     Total Cholesterol: 222 mg/dL       Relevant Medications   furosemide (LASIX) 20 MG tablet   losartan (COZAAR) 50 MG tablet   metoprolol succinate (TOPROL-XL) 50 MG 24 hr tablet   pravastatin (PRAVACHOL) 40 MG tablet   Essential hypertension, benign    Chronic, uncontrolled. Pt states home readings better controlled. I asked her to return in 3 months with home BP cuff to compare to ours.       Relevant Medications   furosemide (LASIX) 20 MG tablet   losartan (COZAAR) 50 MG tablet   metoprolol succinate (TOPROL-XL) 50 MG 24 hr tablet   pravastatin (PRAVACHOL) 40 MG tablet   Diabetes mellitus with stage 3 chronic kidney disease (HCC)    Chronic, uncontrolled. Has close f/u with endo planned.      Relevant Medications   Insulin Glargine (LANTUS SOLOSTAR) 100 UNIT/ML Solostar Pen   losartan (COZAAR) 50 MG tablet   pravastatin (PRAVACHOL) 40 MG tablet   Advanced care planning/counseling discussion    Advanced directives: has this at home. HC proxy would be niece [name] [place]. Aasked to bring me copy           Meds ordered this encounter  Medications  . furosemide (LASIX) 20 MG tablet    Sig: Take 1 tablet (20 mg total) by mouth every other day.    Dispense:  45 tablet    Refill:  3  . levothyroxine (SYNTHROID, LEVOTHROID) 25 MCG tablet    Sig: TAKE 1 TABLET BY MOUTH DAILY. EXCEPT 2 TABLETS ON MON, WED, AND FRI    Dispense:  130 tablet    Refill:  3  . losartan (COZAAR) 50 MG tablet    Sig: Take 1 tablet (50 mg total) by mouth daily.    Dispense:  90 tablet    Refill:  3  . metoprolol succinate (TOPROL-XL) 50 MG 24 hr tablet    Sig: Take 1 tablet (50 mg total) by mouth daily.    Dispense:  90 tablet    Refill:  3  . pantoprazole (PROTONIX) 40 MG tablet    Sig: Take 1 tablet (40 mg total) by mouth daily.     Dispense:  30 tablet    Refill:  6  . pravastatin (PRAVACHOL) 40 MG tablet    Sig: Take 1 tablet (40 mg total) by mouth daily.    Dispense:  90 tablet    Refill:  3   No orders of the defined types were placed in this encounter.   Follow up plan: Return in about 3 months (around 07/10/2018) for follow up visit.  Ria Bush, MD

## 2018-04-09 ENCOUNTER — Encounter: Payer: Self-pay | Admitting: Family Medicine

## 2018-04-09 ENCOUNTER — Ambulatory Visit (INDEPENDENT_AMBULATORY_CARE_PROVIDER_SITE_OTHER): Payer: Medicare Other | Admitting: Family Medicine

## 2018-04-09 VITALS — BP 158/82 | HR 85 | Temp 98.4°F | Ht 65.25 in | Wt 190.2 lb

## 2018-04-09 DIAGNOSIS — E039 Hypothyroidism, unspecified: Secondary | ICD-10-CM

## 2018-04-09 DIAGNOSIS — Z7189 Other specified counseling: Secondary | ICD-10-CM | POA: Diagnosis not present

## 2018-04-09 DIAGNOSIS — H903 Sensorineural hearing loss, bilateral: Secondary | ICD-10-CM | POA: Diagnosis not present

## 2018-04-09 DIAGNOSIS — E1165 Type 2 diabetes mellitus with hyperglycemia: Secondary | ICD-10-CM

## 2018-04-09 DIAGNOSIS — M8580 Other specified disorders of bone density and structure, unspecified site: Secondary | ICD-10-CM

## 2018-04-09 DIAGNOSIS — E785 Hyperlipidemia, unspecified: Secondary | ICD-10-CM

## 2018-04-09 DIAGNOSIS — E1122 Type 2 diabetes mellitus with diabetic chronic kidney disease: Secondary | ICD-10-CM

## 2018-04-09 DIAGNOSIS — E1140 Type 2 diabetes mellitus with diabetic neuropathy, unspecified: Secondary | ICD-10-CM

## 2018-04-09 DIAGNOSIS — N183 Chronic kidney disease, stage 3 (moderate): Secondary | ICD-10-CM

## 2018-04-09 DIAGNOSIS — I10 Essential (primary) hypertension: Secondary | ICD-10-CM | POA: Diagnosis not present

## 2018-04-09 DIAGNOSIS — N2581 Secondary hyperparathyroidism of renal origin: Secondary | ICD-10-CM | POA: Diagnosis not present

## 2018-04-09 DIAGNOSIS — Z0000 Encounter for general adult medical examination without abnormal findings: Secondary | ICD-10-CM | POA: Diagnosis not present

## 2018-04-09 DIAGNOSIS — IMO0002 Reserved for concepts with insufficient information to code with codable children: Secondary | ICD-10-CM

## 2018-04-09 MED ORDER — METOPROLOL SUCCINATE ER 50 MG PO TB24: 50.0000 mg | ORAL_TABLET | Freq: Every day | ORAL | 3 refills | Status: DC

## 2018-04-09 MED ORDER — LEVOTHYROXINE SODIUM 25 MCG PO TABS: ORAL_TABLET | ORAL | 3 refills | Status: DC

## 2018-04-09 MED ORDER — FUROSEMIDE 20 MG PO TABS: 20.0000 mg | ORAL_TABLET | ORAL | 3 refills | Status: DC

## 2018-04-09 MED ORDER — LOSARTAN POTASSIUM 50 MG PO TABS: 50.0000 mg | ORAL_TABLET | Freq: Every day | ORAL | 3 refills | Status: DC

## 2018-04-09 MED ORDER — PANTOPRAZOLE SODIUM 40 MG PO TBEC: 40.0000 mg | DELAYED_RELEASE_TABLET | Freq: Every day | ORAL | 6 refills | Status: DC

## 2018-04-09 MED ORDER — PRAVASTATIN SODIUM 40 MG PO TABS: 40.0000 mg | ORAL_TABLET | Freq: Every day | ORAL | 3 refills | Status: DC

## 2018-04-09 NOTE — Assessment & Plan Note (Signed)
Wears hearing aides 

## 2018-04-09 NOTE — Assessment & Plan Note (Signed)
Chronic, A1c not at goal. Encouraged f/u with endo.

## 2018-04-09 NOTE — Assessment & Plan Note (Signed)
Continue vit D daily.  

## 2018-04-09 NOTE — Assessment & Plan Note (Signed)
Advanced directives: has this at home. HC proxy would be niece [name]. Aasked to bring me copy.  

## 2018-04-09 NOTE — Assessment & Plan Note (Signed)
PTH elevated - will watch for now.

## 2018-04-09 NOTE — Assessment & Plan Note (Signed)
Chronic, stable on current regimen - refilled today.  

## 2018-04-09 NOTE — Assessment & Plan Note (Signed)
Chronic, LDL above goal. Intolerant to statins in the past (crestor, lipitor, pravastatin, lovastatin). Continue pravastatin - seems to be tolerating well at this time. The 10-year ASCVD risk score Mikey Bussing DC Brooke Bonito., et al., 2013) is: 50.5%   Values used to calculate the score:     Age: 77 years     Sex: Female     Is Non-Hispanic African American: No     Diabetic: Yes     Tobacco smoker: No     Systolic Blood Pressure: 627 mmHg     Is BP treated: Yes     HDL Cholesterol: 37.6 mg/dL     Total Cholesterol: 222 mg/dL

## 2018-04-09 NOTE — Progress Notes (Signed)
Hearing Screening Comments: Pt wears hearing aids Vision Screening Comments: Completed Nov 2018 at [facility]

## 2018-04-09 NOTE — Patient Instructions (Addendum)
If interested, check with pharmacy about new 2 shot shingles series (shingrix).  Bring me copy of your advanced directive. Kidneys were a bit worse. Sugar and cholesterol and blood pressure were too high today - return in 3 months for recheck blood pressure, bring BP cuff to appointment.  Work on increased water intake

## 2018-04-09 NOTE — Assessment & Plan Note (Signed)
Chronic, uncontrolled. Has close f/u with endo planned.

## 2018-04-09 NOTE — Assessment & Plan Note (Signed)
Chronic, uncontrolled. Pt states home readings better controlled. I asked her to return in 3 months with home BP cuff to compare to ours.

## 2018-04-11 ENCOUNTER — Other Ambulatory Visit: Payer: Self-pay | Admitting: Family Medicine

## 2018-04-12 ENCOUNTER — Other Ambulatory Visit: Payer: Self-pay | Admitting: Family Medicine

## 2018-04-13 ENCOUNTER — Other Ambulatory Visit: Payer: Self-pay | Admitting: Family Medicine

## 2018-04-13 MED ORDER — INSULIN GLARGINE 100 UNIT/ML SOLOSTAR PEN: 56.0000 [IU] | PEN_INJECTOR | Freq: Every day | SUBCUTANEOUS | 3 refills | Status: DC

## 2018-04-26 ENCOUNTER — Encounter: Payer: Self-pay | Admitting: Family Medicine

## 2018-05-03 DIAGNOSIS — E119 Type 2 diabetes mellitus without complications: Secondary | ICD-10-CM | POA: Diagnosis not present

## 2018-05-19 DIAGNOSIS — Z6831 Body mass index (BMI) 31.0-31.9, adult: Secondary | ICD-10-CM | POA: Diagnosis not present

## 2018-05-19 DIAGNOSIS — Z01419 Encounter for gynecological examination (general) (routine) without abnormal findings: Secondary | ICD-10-CM | POA: Diagnosis not present

## 2018-06-14 ENCOUNTER — Encounter: Payer: Self-pay | Admitting: Hematology and Oncology

## 2018-06-14 DIAGNOSIS — Z1231 Encounter for screening mammogram for malignant neoplasm of breast: Secondary | ICD-10-CM | POA: Diagnosis not present

## 2018-06-14 DIAGNOSIS — Z853 Personal history of malignant neoplasm of breast: Secondary | ICD-10-CM | POA: Diagnosis not present

## 2018-06-17 ENCOUNTER — Encounter: Payer: Self-pay | Admitting: Hematology and Oncology

## 2018-06-17 DIAGNOSIS — Z1231 Encounter for screening mammogram for malignant neoplasm of breast: Secondary | ICD-10-CM | POA: Diagnosis not present

## 2018-06-17 DIAGNOSIS — Z853 Personal history of malignant neoplasm of breast: Secondary | ICD-10-CM | POA: Diagnosis not present

## 2018-06-17 DIAGNOSIS — R928 Other abnormal and inconclusive findings on diagnostic imaging of breast: Secondary | ICD-10-CM | POA: Diagnosis not present

## 2018-06-17 DIAGNOSIS — N6489 Other specified disorders of breast: Secondary | ICD-10-CM | POA: Diagnosis not present

## 2018-07-12 ENCOUNTER — Ambulatory Visit: Payer: Medicare Other | Admitting: Family Medicine

## 2018-07-16 ENCOUNTER — Encounter: Payer: Self-pay | Admitting: Family Medicine

## 2018-07-19 ENCOUNTER — Telehealth: Payer: Self-pay

## 2018-07-19 MED ORDER — INSULIN GLARGINE 100 UNIT/ML SOLOSTAR PEN: 56.0000 [IU] | PEN_INJECTOR | Freq: Every day | SUBCUTANEOUS | 1 refills | Status: DC

## 2018-07-19 NOTE — Telephone Encounter (Signed)
Pt sent MyChart msg requesting a 90-day supply of Lantus be sent to [facility] in [place]   [name] with Elta Guadeloupe of [facility] asking for correct amt to send for 90-days with pt taking 56 u daily.  Per Elta Guadeloupe, rx should be 51 ml but still include "90-day supply" in the note.  E-scribed #51 mg/1 of Lantus for pt with note suggested by [facility]. Notified pt via [place].

## 2018-07-19 NOTE — Telephone Encounter (Signed)
E-scribed 90-day supply of Lantus. [See TE, 07/19/18.] Notified pt.

## 2018-07-21 ENCOUNTER — Telehealth: Payer: Self-pay

## 2018-07-21 MED ORDER — PEN NEEDLES 32G X 4 MM MISC: 1.0000 | 0 refills | Status: DC

## 2018-07-21 NOTE — Telephone Encounter (Signed)
E-scribed pen needles. [See TE, 07/21/18.]

## 2018-07-21 NOTE — Telephone Encounter (Signed)
Received MyChart message from pt requesting refill for pen needles. [See Pt Msg, 07/16/17.]  E-scribed refill. Notified pt via [place].

## 2018-09-29 ENCOUNTER — Encounter: Payer: Self-pay | Admitting: Family Medicine

## 2018-09-29 MED ORDER — INSULIN GLARGINE 100 UNIT/ML SOLOSTAR PEN: 56.0000 [IU] | PEN_INJECTOR | Freq: Every day | SUBCUTANEOUS | 0 refills | Status: DC

## 2018-09-29 NOTE — Telephone Encounter (Signed)
E-scribed Lantus refill.  Do you still want pt taking potassium?

## 2018-09-30 MED ORDER — POTASSIUM CHLORIDE CRYS ER 20 MEQ PO TBCR: 20.0000 meq | EXTENDED_RELEASE_TABLET | Freq: Every day | ORAL | 3 refills | Status: DC

## 2018-10-08 ENCOUNTER — Other Ambulatory Visit: Payer: Self-pay | Admitting: Family Medicine

## 2018-10-14 ENCOUNTER — Encounter: Payer: Self-pay | Admitting: Family Medicine

## 2018-10-18 ENCOUNTER — Ambulatory Visit: Payer: Medicare Other | Admitting: Family Medicine

## 2018-10-18 ENCOUNTER — Encounter: Payer: Self-pay | Admitting: Family Medicine

## 2018-10-18 ENCOUNTER — Telehealth: Payer: Self-pay | Admitting: Family Medicine

## 2018-10-18 ENCOUNTER — Ambulatory Visit (INDEPENDENT_AMBULATORY_CARE_PROVIDER_SITE_OTHER): Payer: Medicare Other | Admitting: Family Medicine

## 2018-10-18 VITALS — BP 158/65 | HR 62 | Temp 98.0°F | Ht 65.25 in | Wt 186.5 lb

## 2018-10-18 DIAGNOSIS — E785 Hyperlipidemia, unspecified: Secondary | ICD-10-CM

## 2018-10-18 DIAGNOSIS — E039 Hypothyroidism, unspecified: Secondary | ICD-10-CM | POA: Diagnosis not present

## 2018-10-18 DIAGNOSIS — E1165 Type 2 diabetes mellitus with hyperglycemia: Secondary | ICD-10-CM | POA: Diagnosis not present

## 2018-10-18 DIAGNOSIS — E1122 Type 2 diabetes mellitus with diabetic chronic kidney disease: Secondary | ICD-10-CM | POA: Diagnosis not present

## 2018-10-18 DIAGNOSIS — N183 Chronic kidney disease, stage 3 unspecified: Secondary | ICD-10-CM

## 2018-10-18 DIAGNOSIS — I10 Essential (primary) hypertension: Secondary | ICD-10-CM | POA: Diagnosis not present

## 2018-10-18 DIAGNOSIS — IMO0002 Reserved for concepts with insufficient information to code with codable children: Secondary | ICD-10-CM

## 2018-10-18 DIAGNOSIS — E1140 Type 2 diabetes mellitus with diabetic neuropathy, unspecified: Secondary | ICD-10-CM | POA: Diagnosis not present

## 2018-10-18 MED ORDER — LOSARTAN POTASSIUM 100 MG PO TABS: 100.0000 mg | ORAL_TABLET | Freq: Every day | ORAL | 1 refills | Status: DC

## 2018-10-18 NOTE — Assessment & Plan Note (Signed)
Chronic, update renal panel.

## 2018-10-18 NOTE — Telephone Encounter (Signed)
We are increasing losartan to 100mg  daily.  plz call to schedule curbside lab visit only in 1-2 weeks. plz schedule 3 month f/u visit for HTN. Thank you.

## 2018-10-18 NOTE — Assessment & Plan Note (Signed)
Chronic, too high based on home readings. Will increase losartan to 100mg  daily, check Cr in 1-2 wks.

## 2018-10-18 NOTE — Patient Instructions (Signed)
Increase losartan to 100mg  daily. Schedule lab visit only 1-2 weeks. Return in 3 months for f/u visit.

## 2018-10-18 NOTE — Progress Notes (Signed)
Virtual visit completed through [email or web address]. Due to national recommendations of social distancing due to [place] 19, a virtual visit is felt to be most appropriate for this patient at this time.   Patient location: home Provider location: [place] at [facility], office If any vitals were documented, they were collected by patient at home unless specified below.    BP (!) 158/65 (BP Location: Right Arm, Patient Position: Sitting, Cuff Size: Normal)   Pulse 62   Temp 98 F (36.7 C) (Oral)   Ht 5' 5.25" (1.657 m)   Wt 186 lb 8 oz (84.6 kg)   BMI 30.80 kg/m    CC: 6 mo f/u visit Subjective:    Patient ID: Julia Banks, female    DOB: 1940/10/30, 78 y.o.   MRN: [id number]  HPI: Julia Banks is a 78 y.o. female presenting on 10/18/2018 for Follow-up   HTN - Compliant with current antihypertensive regimen of losartan 50mg  daily, toprol XL 50mg  daily, lasix 20mg  QOD.  Does check blood pressures at home: fluctuates - max reading 180/82, lowest 127/60 - averaging 315 systolic. No low blood pressure readings or symptoms of dizziness/syncope. Denies HA, vision changes, CP/tightness, SOB, leg swelling.   DM - followed by endo Dr Buddy Duty. Unsure when she last saw him. Fasting cbg this morning 164. On lantus 56u daily. Lowest sugar 69, highest 268. No paresthesias. She reports she is doing better with following diabetic diet.  Lab Results  Component Value Date   HGBA1C 8.1 (H) 04/06/2018    Hypothyroid - compliant with levothyroxine 48mcg daily, 91mcg on MWF.  Lab Results  Component Value Date   TSH 4.47 04/06/2018        Relevant past medical, surgical, family and social history reviewed and updated as indicated. Interim medical history since our last visit reviewed. Allergies and medications reviewed and updated. Outpatient Medications Prior to Visit  Medication Sig Dispense Refill  . albuterol (PROVENTIL HFA;VENTOLIN HFA) 108 (90 BASE) MCG/ACT inhaler Inhale into the lungs every 6  (six) hours as needed for wheezing or shortness of breath.    . Ascorbic Acid (VITAMIN C WITH ROSE HIPS) 500 MG tablet Take 500 mg by mouth daily.    . Cholecalciferol (VITAMIN D PO) Take 5,000 Units by mouth.    . diphenhydrAMINE (BENADRYL) 12.5 MG chewable tablet Chew 12.5 mg by mouth at bedtime as needed for sleep.    Marland Kitchen docusate sodium (COLACE) 100 MG capsule Take 100 mg by mouth 2 (two) times daily.    Marland Kitchen doxycycline (VIBRAMYCIN) 100 MG capsule Take 100 mg by mouth daily as needed (FOR FACE).     . Fluticasone-Salmeterol (ADVAIR) 250-50 MCG/DOSE AEPB Inhale 1 puff into the lungs 2 (two) times daily.    . furosemide (LASIX) 20 MG tablet Take 1 tablet (20 mg total) by mouth every other day. 45 tablet 3  . Insulin Glargine (LANTUS SOLOSTAR) 100 UNIT/ML Solostar Pen Inject 56 Units into the skin daily with breakfast. 90-day supply 51 mL 0  . Insulin Pen Needle (PEN NEEDLES) 32G X 4 MM MISC 1 each by Does not apply route as directed. Use as directed to inject medication daily.  Dx code:  E11.40, E11.65 100 each 0  . levothyroxine (SYNTHROID, LEVOTHROID) 25 MCG tablet TAKE 1 TABLET BY MOUTH DAILY. EXCEPT 2 TABLETS ON MON, WED, AND FRI 130 tablet 3  . metoprolol succinate (TOPROL-XL) 50 MG 24 hr tablet Take 1 tablet (50 mg total) by mouth daily. 90 tablet  3  . Omega-3 Fatty Acids (OMEGA 3 PO) Take by mouth. 180 EPA/120DHA    . OVER THE COUNTER MEDICATION Calcium magnesium 1000mg  day    . OVER THE COUNTER MEDICATION Women's multivitamin with iron    . pantoprazole (PROTONIX) 40 MG tablet TAKE 1 TABLET BY MOUTH EVERY DAY 90 tablet 0  . potassium chloride SA (KLOR-CON M20) 20 MEQ tablet Take 1 tablet (20 mEq total) by mouth daily. 90 tablet 3  . pravastatin (PRAVACHOL) 40 MG tablet Take 1 tablet (40 mg total) by mouth daily. 90 tablet 3  . vitamin B-12 (CYANOCOBALAMIN) 1000 MCG tablet Take 1,000 mcg by mouth daily.    Marland Kitchen losartan (COZAAR) 50 MG tablet Take 1 tablet (50 mg total) by mouth daily. 90 tablet  3   No facility-administered medications prior to visit.      Per HPI unless specifically indicated in ROS section below Review of Systems Objective:    BP (!) 158/65 (BP Location: Right Arm, Patient Position: Sitting, Cuff Size: Normal)   Pulse 62   Temp 98 F (36.7 C) (Oral)   Ht 5' 5.25" (1.657 m)   Wt 186 lb 8 oz (84.6 kg)   BMI 30.80 kg/m   Wt Readings from Last 3 Encounters:  10/18/18 186 lb 8 oz (84.6 kg)  04/09/18 190 lb 4 oz (86.3 kg)  03/30/17 199 lb 4 oz (90.4 kg)     Physical exam: Gen: alert, NAD, not ill appearing Pulm: speaks in complete sentences without increased work of breathing Psych: normal mood, normal thought content      Results for orders placed or performed in visit on 04/06/18  T4, free  Result Value Ref Range   Free T4 1.08 0.60 - 1.60 ng/dL  Parathyroid hormone, intact (no Ca)  Result Value Ref Range   PTH 72 (H) 14 - 64 pg/mL  Hemoglobin A1c  Result Value Ref Range   Hgb A1c MFr Bld 8.1 (H) 4.6 - 6.5 %  TSH  Result Value Ref Range   TSH 4.47 0.35 - 4.50 uIU/mL  Lipid panel  Result Value Ref Range   Cholesterol 222 (H) 0 - 200 mg/dL   Triglycerides 144.0 0.0 - 149.0 mg/dL   HDL 37.60 (L) >39.00 mg/dL   VLDL 28.8 0.0 - 40.0 mg/dL   LDL Cholesterol 156 (H) 0 - 99 mg/dL   Total CHOL/HDL Ratio 6    NonHDL 184.58   Comprehensive metabolic panel  Result Value Ref Range   Sodium 138 135 - 145 mEq/L   Potassium 4.3 3.5 - 5.1 mEq/L   Chloride 103 96 - 112 mEq/L   CO2 28 19 - 32 mEq/L   Glucose, Bld 221 (H) 70 - 99 mg/dL   BUN 20 6 - 23 mg/dL   Creatinine, Ser 1.35 (H) 0.40 - 1.20 mg/dL   Total Bilirubin 0.7 0.2 - 1.2 mg/dL   Alkaline Phosphatase 90 39 - 117 U/L   AST 17 0 - 37 U/L   ALT 14 0 - 35 U/L   Total Protein 7.3 6.0 - 8.3 g/dL   Albumin 4.1 3.5 - 5.2 g/dL   Calcium 9.7 8.4 - 10.5 mg/dL   GFR 40.62 (L) >60.00 mL/min   Assessment & Plan:   Problem List Items Addressed This Visit    Type 2 diabetes, uncontrolled, with  neuropathy ([place]) - Primary    Chronic. Update A1c in 1-2 wks f/u visit. She has not recently seen endo  Relevant Medications   losartan (COZAAR) 100 MG tablet   Other Relevant Orders   Hemoglobin A1c   Hypothyroidism   Relevant Orders   TSH   HLD (hyperlipidemia)    Chronic, continue pravastatin. Update FLP.       Relevant Medications   losartan (COZAAR) 100 MG tablet   Other Relevant Orders   Lipid panel   Essential hypertension, benign    Chronic, too high based on home readings. Will increase losartan to 100mg  daily, check Cr in 1-2 wks.       Relevant Medications   losartan (COZAAR) 100 MG tablet   Diabetes mellitus with stage 3 chronic kidney disease (HCC)    Chronic, update renal panel.       Relevant Medications   losartan (COZAAR) 100 MG tablet   Other Relevant Orders   Renal function panel       Meds ordered this encounter  Medications  . losartan (COZAAR) 100 MG tablet    Sig: Take 1 tablet (100 mg total) by mouth daily.    Dispense:  90 tablet    Refill:  1    Note new sig   Orders Placed This Encounter  Procedures  . Lipid panel    Standing Status:   Future    Standing Expiration Date:   10/18/2019  . Hemoglobin A1c    Standing Status:   Future    Standing Expiration Date:   10/18/2019  . TSH    Standing Status:   Future    Standing Expiration Date:   10/18/2019  . Renal function panel    Standing Status:   Future    Standing Expiration Date:   10/18/2019    Patient Instructions  Increase losartan to 100mg  daily. Schedule lab visit only 1-2 weeks. Return in 3 months for f/u visit.    Follow up plan: Return in about 3 months (around 01/17/2019) for follow up visit.  Ria Bush, MD

## 2018-10-18 NOTE — Assessment & Plan Note (Signed)
Chronic. Update A1c in 1-2 wks f/u visit. She has not recently seen endo

## 2018-10-18 NOTE — Telephone Encounter (Signed)
Pt scheduled for 11/01/18 @ 11:30am  3 month follow up scheduled for 01/17/19 @ 12pm

## 2018-10-18 NOTE — Assessment & Plan Note (Addendum)
Chronic, continue pravastatin. Update FLP.

## 2018-10-21 ENCOUNTER — Other Ambulatory Visit: Payer: Self-pay | Admitting: Family Medicine

## 2018-10-27 ENCOUNTER — Encounter: Payer: Self-pay | Admitting: Family Medicine

## 2018-11-01 ENCOUNTER — Other Ambulatory Visit: Payer: Self-pay

## 2018-11-01 ENCOUNTER — Other Ambulatory Visit (INDEPENDENT_AMBULATORY_CARE_PROVIDER_SITE_OTHER): Payer: Medicare Other

## 2018-11-01 DIAGNOSIS — IMO0002 Reserved for concepts with insufficient information to code with codable children: Secondary | ICD-10-CM

## 2018-11-01 DIAGNOSIS — E785 Hyperlipidemia, unspecified: Secondary | ICD-10-CM | POA: Diagnosis not present

## 2018-11-01 DIAGNOSIS — E1140 Type 2 diabetes mellitus with diabetic neuropathy, unspecified: Secondary | ICD-10-CM | POA: Diagnosis not present

## 2018-11-01 DIAGNOSIS — N183 Chronic kidney disease, stage 3 (moderate): Secondary | ICD-10-CM

## 2018-11-01 DIAGNOSIS — E1122 Type 2 diabetes mellitus with diabetic chronic kidney disease: Secondary | ICD-10-CM | POA: Diagnosis not present

## 2018-11-01 DIAGNOSIS — E039 Hypothyroidism, unspecified: Secondary | ICD-10-CM

## 2018-11-01 DIAGNOSIS — E1165 Type 2 diabetes mellitus with hyperglycemia: Secondary | ICD-10-CM | POA: Diagnosis not present

## 2018-11-01 LAB — RENAL FUNCTION PANEL
Albumin: 4.2 g/dL (ref 3.5–5.2)
BUN: 14 mg/dL (ref 6–23)
CO2: 26 mEq/L (ref 19–32)
Calcium: 9.4 mg/dL (ref 8.4–10.5)
Chloride: 105 mEq/L (ref 96–112)
Creatinine, Ser: 1.24 mg/dL — ABNORMAL HIGH (ref 0.40–1.20)
GFR: 42.09 mL/min — ABNORMAL LOW (ref >60.00)
Glucose, Bld: 164 mg/dL — ABNORMAL HIGH (ref 70–99)
Phosphorus: 3.6 mg/dL (ref 2.3–4.6)
Potassium: 4.8 mEq/L (ref 3.5–5.1)
Sodium: 138 mEq/L (ref 135–145)

## 2018-11-01 LAB — LIPID PANEL
Cholesterol: 208 mg/dL — ABNORMAL HIGH (ref 0–200)
HDL: 38.8 mg/dL — ABNORMAL LOW (ref >39.00)
LDL Cholesterol: 139 mg/dL — ABNORMAL HIGH (ref 0–99)
NonHDL: 169.2
Total CHOL/HDL Ratio: 5
Triglycerides: 151 mg/dL — ABNORMAL HIGH (ref 0.0–149.0)
VLDL: 30.2 mg/dL (ref 0.0–40.0)

## 2018-11-01 LAB — HEMOGLOBIN A1C: Hgb A1c MFr Bld: 7.2 % — ABNORMAL HIGH (ref 4.6–6.5)

## 2018-11-01 LAB — TSH: TSH: 4.23 u[IU]/mL (ref 0.35–4.50)

## 2018-11-14 ENCOUNTER — Other Ambulatory Visit: Payer: Self-pay | Admitting: Family Medicine

## 2018-11-15 NOTE — Telephone Encounter (Signed)
Refill for Doxy from patient. No recent refills seen. Please review. Comments said prn.

## 2018-11-17 NOTE — Telephone Encounter (Signed)
Denied. I believe derm prescribes this medication.

## 2019-01-05 ENCOUNTER — Other Ambulatory Visit: Payer: Self-pay | Admitting: Family Medicine

## 2019-01-08 ENCOUNTER — Encounter: Payer: Self-pay | Admitting: Family Medicine

## 2019-01-11 MED ORDER — LOSARTAN POTASSIUM 50 MG PO TABS: 50.0000 mg | ORAL_TABLET | Freq: Every day | ORAL | 1 refills | Status: DC

## 2019-01-11 NOTE — Telephone Encounter (Signed)
Appt changed to MyChart virtual visit

## 2019-01-11 NOTE — Telephone Encounter (Signed)
plz change upcoming appt to virtual visit.

## 2019-01-17 ENCOUNTER — Telehealth (INDEPENDENT_AMBULATORY_CARE_PROVIDER_SITE_OTHER): Payer: Medicare Other | Admitting: Family Medicine

## 2019-01-17 ENCOUNTER — Encounter: Payer: Self-pay | Admitting: Family Medicine

## 2019-01-17 VITALS — BP 133/58 | HR 65 | Ht 65.25 in | Wt 183.5 lb

## 2019-01-17 DIAGNOSIS — I10 Essential (primary) hypertension: Secondary | ICD-10-CM

## 2019-01-17 DIAGNOSIS — N183 Chronic kidney disease, stage 3 unspecified: Secondary | ICD-10-CM

## 2019-01-17 DIAGNOSIS — E1122 Type 2 diabetes mellitus with diabetic chronic kidney disease: Secondary | ICD-10-CM

## 2019-01-17 NOTE — Assessment & Plan Note (Signed)
Again reviewed renal disease with patient. Will update labs at next visit.

## 2019-01-17 NOTE — Assessment & Plan Note (Signed)
Chronic, doing better on lower dose losartan - will continue current regimen.

## 2019-01-17 NOTE — Progress Notes (Signed)
Virtual visit completed through [place]. Due to national recommendations of social distancing due to COVID-19, a virtual visit is felt to be most appropriate for this patient at this time. Reviewed limitations of a virtual visit.   Patient location: home Provider location: [place] at [facility], office If any vitals were documented, they were collected by patient at home unless specified below.    BP (!) 133/58 (BP Location: Right Wrist, Patient Position: Sitting, Cuff Size: Normal)   Pulse 65   Ht 5' 5.25" (1.657 m)   Wt 183 lb 8 oz (83.2 kg)   BMI 30.30 kg/m    CC: 3 mo HTN f/u visit Subjective:    Patient ID: Julia Banks, female    DOB: 08-03-1940, 78 y.o.   MRN: [id number]  HPI: Julia Banks is a 78 y.o. female presenting on 01/17/2019 for Follow-up (133/58, HR 65 - pt back on Losartan 50mg , did not tolerate 100mg )   HTN - Compliant with current antihypertensive regimen of losartan 50mg  (did not tolerate 100mg  dose), lasix 20mg  QOD, toprol XL 50mg  daily.  Does check blood pressures daily at home: some elevated readings but overall well controlled. This morning: 133/58. Malaise/fatigue, HA and dizziness with higher losartan. Denies vision changes, CP/tightness, SOB, leg swelling.    DM followed by Dr Buddy Duty.       Relevant past medical, surgical, family and social history reviewed and updated as indicated. Interim medical history since our last visit reviewed. Allergies and medications reviewed and updated. Outpatient Medications Prior to Visit  Medication Sig Dispense Refill  . albuterol (PROVENTIL HFA;VENTOLIN HFA) 108 (90 BASE) MCG/ACT inhaler Inhale into the lungs every 6 (six) hours as needed for wheezing or shortness of breath.    . Ascorbic Acid (VITAMIN C WITH ROSE HIPS) 500 MG tablet Take 500 mg by mouth daily.    . BD PEN NEEDLE NANO U/F 32G X 4 MM MISC USE 1 AS DIRECTED TO INJECT MEDICATION DAILY. DX CODE: E11.40, E11.65 100 each 1  . Cholecalciferol  (VITAMIN D PO) Take 5,000 Units by mouth.    . diphenhydrAMINE (BENADRYL) 12.5 MG chewable tablet Chew 12.5 mg by mouth at bedtime as needed for sleep.    Marland Kitchen docusate sodium (COLACE) 100 MG capsule Take 100 mg by mouth 2 (two) times daily.    . Fluticasone-Salmeterol (ADVAIR) 250-50 MCG/DOSE AEPB Inhale 1 puff into the lungs 2 (two) times daily.    . furosemide (LASIX) 20 MG tablet Take 1 tablet (20 mg total) by mouth every other day. 45 tablet 3  . Insulin Glargine (LANTUS SOLOSTAR) 100 UNIT/ML Solostar Pen Inject 56 Units into the skin daily with breakfast. 90-day supply 51 mL 0  . levothyroxine (SYNTHROID, LEVOTHROID) 25 MCG tablet TAKE 1 TABLET BY MOUTH DAILY. EXCEPT 2 TABLETS ON MON, WED, AND FRI 130 tablet 3  . losartan (COZAAR) 50 MG tablet Take 1 tablet (50 mg total) by mouth daily. 90 tablet 1  . metoprolol succinate (TOPROL-XL) 50 MG 24 hr tablet Take 1 tablet (50 mg total) by mouth daily. 90 tablet 3  . Omega-3 Fatty Acids (OMEGA 3 PO) Take by mouth. 180 EPA/120DHA    . OVER THE COUNTER MEDICATION Calcium magnesium 1000mg  day    . OVER THE COUNTER MEDICATION Women's multivitamin with iron    . pantoprazole (PROTONIX) 40 MG tablet TAKE 1 TABLET BY MOUTH EVERY DAY 90 tablet 0  . potassium chloride SA (KLOR-CON M20) 20 MEQ tablet Take 1 tablet (20  mEq total) by mouth daily. 90 tablet 3  . pravastatin (PRAVACHOL) 40 MG tablet Take 1 tablet (40 mg total) by mouth daily. 90 tablet 3  . vitamin B-12 (CYANOCOBALAMIN) 1000 MCG tablet Take 1,000 mcg by mouth daily.    Marland Kitchen doxycycline (VIBRAMYCIN) 100 MG capsule Take 100 mg by mouth daily as needed (FOR FACE).      No facility-administered medications prior to visit.      Per HPI unless specifically indicated in ROS section below Review of Systems Objective:    BP (!) 133/58 (BP Location: Right Wrist, Patient Position: Sitting, Cuff Size: Normal)   Pulse 65   Ht 5' 5.25" (1.657 m)   Wt 183 lb 8 oz (83.2 kg)   BMI 30.30 kg/m   Wt Readings  from Last 3 Encounters:  01/17/19 183 lb 8 oz (83.2 kg)  10/18/18 186 lb 8 oz (84.6 kg)  04/09/18 190 lb 4 oz (86.3 kg)     Physical exam: Gen: alert, NAD, not ill appearing Pulm: speaks in complete sentences without increased work of breathing Psych: normal mood, normal thought content      Assessment & Plan:   Problem List Items Addressed This Visit    Essential hypertension, benign - Primary    Chronic, doing better on lower dose losartan - will continue current regimen.       Diabetes mellitus with stage 3 chronic kidney disease ([place])    Again reviewed renal disease with patient. Will update labs at next visit.           No orders of the defined types were placed in this encounter.  No orders of the defined types were placed in this encounter.   I discussed the assessment and treatment plan with the patient. The patient was provided an opportunity to ask questions and all were answered. The patient agreed with the plan and demonstrated an understanding of the instructions. The patient was advised to call back or seek an in-person evaluation if the symptoms worsen or if the condition fails to improve as anticipated.  Follow up plan: Return in about 3 months (around 04/19/2019) for annual exam, prior fasting for blood work.  Ria Bush, MD

## 2019-02-08 ENCOUNTER — Other Ambulatory Visit: Payer: Self-pay | Admitting: Family Medicine

## 2019-02-15 DIAGNOSIS — L578 Other skin changes due to chronic exposure to nonionizing radiation: Secondary | ICD-10-CM | POA: Diagnosis not present

## 2019-02-15 DIAGNOSIS — L570 Actinic keratosis: Secondary | ICD-10-CM | POA: Diagnosis not present

## 2019-02-15 DIAGNOSIS — Z872 Personal history of diseases of the skin and subcutaneous tissue: Secondary | ICD-10-CM | POA: Diagnosis not present

## 2019-02-15 DIAGNOSIS — L249 Irritant contact dermatitis, unspecified cause: Secondary | ICD-10-CM | POA: Diagnosis not present

## 2019-02-15 DIAGNOSIS — Z86018 Personal history of other benign neoplasm: Secondary | ICD-10-CM | POA: Diagnosis not present

## 2019-02-17 ENCOUNTER — Encounter: Payer: Self-pay | Admitting: Family Medicine

## 2019-02-18 MED ORDER — LANTUS SOLOSTAR 100 UNIT/ML ~~LOC~~ SOPN: PEN_INJECTOR | SUBCUTANEOUS | 0 refills | Status: DC

## 2019-02-18 NOTE — Telephone Encounter (Signed)
Spoke with [facility] in [place] asking how much Lantus would be a 90-day supply.  Told to send rx for 51 ml.   E-scribed new rx.  Notified pt via [place].

## 2019-02-28 DIAGNOSIS — C50412 Malignant neoplasm of upper-outer quadrant of left female breast: Secondary | ICD-10-CM | POA: Diagnosis not present

## 2019-03-07 ENCOUNTER — Encounter (HOSPITAL_COMMUNITY): Payer: Self-pay | Admitting: Emergency Medicine

## 2019-03-07 ENCOUNTER — Observation Stay (HOSPITAL_COMMUNITY): Payer: Medicare Other

## 2019-03-07 ENCOUNTER — Emergency Department (HOSPITAL_COMMUNITY): Payer: Medicare Other

## 2019-03-07 ENCOUNTER — Other Ambulatory Visit: Payer: Self-pay

## 2019-03-07 ENCOUNTER — Inpatient Hospital Stay (HOSPITAL_COMMUNITY)
Admission: EM | Admit: 2019-03-07 | Discharge: 2019-03-09 | DRG: 065 | Disposition: A | Discharge status: Rehabilitation Facility | Payer: Medicare Other | Attending: Internal Medicine | Admitting: Internal Medicine

## 2019-03-07 DIAGNOSIS — R531 Weakness: Secondary | ICD-10-CM

## 2019-03-07 DIAGNOSIS — E118 Type 2 diabetes mellitus with unspecified complications: Secondary | ICD-10-CM | POA: Diagnosis present

## 2019-03-07 DIAGNOSIS — I129 Hypertensive chronic kidney disease with stage 1 through stage 4 chronic kidney disease, or unspecified chronic kidney disease: Secondary | ICD-10-CM | POA: Diagnosis present

## 2019-03-07 DIAGNOSIS — Z170 Estrogen receptor positive status [ER+]: Secondary | ICD-10-CM | POA: Diagnosis not present

## 2019-03-07 DIAGNOSIS — M8580 Other specified disorders of bone density and structure, unspecified site: Secondary | ICD-10-CM | POA: Diagnosis present

## 2019-03-07 DIAGNOSIS — Z79890 Hormone replacement therapy (postmenopausal): Secondary | ICD-10-CM

## 2019-03-07 DIAGNOSIS — G8194 Hemiplegia, unspecified affecting left nondominant side: Secondary | ICD-10-CM | POA: Diagnosis present

## 2019-03-07 DIAGNOSIS — Z20828 Contact with and (suspected) exposure to other viral communicable diseases: Secondary | ICD-10-CM | POA: Diagnosis present

## 2019-03-07 DIAGNOSIS — N2581 Secondary hyperparathyroidism of renal origin: Secondary | ICD-10-CM | POA: Diagnosis present

## 2019-03-07 DIAGNOSIS — Z8349 Family history of other endocrine, nutritional and metabolic diseases: Secondary | ICD-10-CM | POA: Diagnosis not present

## 2019-03-07 DIAGNOSIS — Z853 Personal history of malignant neoplasm of breast: Secondary | ICD-10-CM | POA: Diagnosis not present

## 2019-03-07 DIAGNOSIS — I6381 Other cerebral infarction due to occlusion or stenosis of small artery: Secondary | ICD-10-CM | POA: Diagnosis present

## 2019-03-07 DIAGNOSIS — Z794 Long term (current) use of insulin: Secondary | ICD-10-CM

## 2019-03-07 DIAGNOSIS — E1142 Type 2 diabetes mellitus with diabetic polyneuropathy: Secondary | ICD-10-CM | POA: Diagnosis present

## 2019-03-07 DIAGNOSIS — R29706 NIHSS score 6: Secondary | ICD-10-CM | POA: Diagnosis present

## 2019-03-07 DIAGNOSIS — I2510 Atherosclerotic heart disease of native coronary artery without angina pectoris: Secondary | ICD-10-CM | POA: Diagnosis present

## 2019-03-07 DIAGNOSIS — E1151 Type 2 diabetes mellitus with diabetic peripheral angiopathy without gangrene: Secondary | ICD-10-CM | POA: Diagnosis present

## 2019-03-07 DIAGNOSIS — E1140 Type 2 diabetes mellitus with diabetic neuropathy, unspecified: Secondary | ICD-10-CM | POA: Diagnosis present

## 2019-03-07 DIAGNOSIS — E162 Hypoglycemia, unspecified: Secondary | ICD-10-CM | POA: Diagnosis not present

## 2019-03-07 DIAGNOSIS — R29810 Facial weakness: Secondary | ICD-10-CM | POA: Diagnosis not present

## 2019-03-07 DIAGNOSIS — E1169 Type 2 diabetes mellitus with other specified complication: Secondary | ICD-10-CM | POA: Diagnosis present

## 2019-03-07 DIAGNOSIS — N183 Chronic kidney disease, stage 3 unspecified: Secondary | ICD-10-CM | POA: Diagnosis present

## 2019-03-07 DIAGNOSIS — I10 Essential (primary) hypertension: Secondary | ICD-10-CM

## 2019-03-07 DIAGNOSIS — E785 Hyperlipidemia, unspecified: Secondary | ICD-10-CM | POA: Diagnosis present

## 2019-03-07 DIAGNOSIS — E1165 Type 2 diabetes mellitus with hyperglycemia: Secondary | ICD-10-CM | POA: Diagnosis not present

## 2019-03-07 DIAGNOSIS — Z823 Family history of stroke: Secondary | ICD-10-CM | POA: Diagnosis not present

## 2019-03-07 DIAGNOSIS — I63032 Cerebral infarction due to thrombosis of left carotid artery: Secondary | ICD-10-CM | POA: Diagnosis not present

## 2019-03-07 DIAGNOSIS — Z833 Family history of diabetes mellitus: Secondary | ICD-10-CM | POA: Diagnosis not present

## 2019-03-07 DIAGNOSIS — Z9012 Acquired absence of left breast and nipple: Secondary | ICD-10-CM

## 2019-03-07 DIAGNOSIS — H903 Sensorineural hearing loss, bilateral: Secondary | ICD-10-CM | POA: Diagnosis present

## 2019-03-07 DIAGNOSIS — K219 Gastro-esophageal reflux disease without esophagitis: Secondary | ICD-10-CM | POA: Diagnosis present

## 2019-03-07 DIAGNOSIS — I69322 Dysarthria following cerebral infarction: Secondary | ICD-10-CM | POA: Diagnosis not present

## 2019-03-07 DIAGNOSIS — E039 Hypothyroidism, unspecified: Secondary | ICD-10-CM | POA: Diagnosis present

## 2019-03-07 DIAGNOSIS — Z90710 Acquired absence of both cervix and uterus: Secondary | ICD-10-CM

## 2019-03-07 DIAGNOSIS — I639 Cerebral infarction, unspecified: Secondary | ICD-10-CM | POA: Diagnosis present

## 2019-03-07 DIAGNOSIS — R200 Anesthesia of skin: Secondary | ICD-10-CM | POA: Diagnosis not present

## 2019-03-07 DIAGNOSIS — Z8249 Family history of ischemic heart disease and other diseases of the circulatory system: Secondary | ICD-10-CM | POA: Diagnosis not present

## 2019-03-07 DIAGNOSIS — G4733 Obstructive sleep apnea (adult) (pediatric): Secondary | ICD-10-CM | POA: Diagnosis present

## 2019-03-07 DIAGNOSIS — R29818 Other symptoms and signs involving the nervous system: Secondary | ICD-10-CM | POA: Diagnosis not present

## 2019-03-07 DIAGNOSIS — Z7951 Long term (current) use of inhaled steroids: Secondary | ICD-10-CM

## 2019-03-07 DIAGNOSIS — E11649 Type 2 diabetes mellitus with hypoglycemia without coma: Secondary | ICD-10-CM | POA: Diagnosis not present

## 2019-03-07 DIAGNOSIS — E1122 Type 2 diabetes mellitus with diabetic chronic kidney disease: Secondary | ICD-10-CM | POA: Diagnosis present

## 2019-03-07 DIAGNOSIS — I69354 Hemiplegia and hemiparesis following cerebral infarction affecting left non-dominant side: Secondary | ICD-10-CM | POA: Diagnosis not present

## 2019-03-07 DIAGNOSIS — J45909 Unspecified asthma, uncomplicated: Secondary | ICD-10-CM | POA: Diagnosis present

## 2019-03-07 DIAGNOSIS — I169 Hypertensive crisis, unspecified: Secondary | ICD-10-CM | POA: Diagnosis not present

## 2019-03-07 DIAGNOSIS — Z79899 Other long term (current) drug therapy: Secondary | ICD-10-CM

## 2019-03-07 DIAGNOSIS — R7309 Other abnormal glucose: Secondary | ICD-10-CM | POA: Diagnosis not present

## 2019-03-07 DIAGNOSIS — D62 Acute posthemorrhagic anemia: Secondary | ICD-10-CM | POA: Diagnosis not present

## 2019-03-07 DIAGNOSIS — M1990 Unspecified osteoarthritis, unspecified site: Secondary | ICD-10-CM | POA: Diagnosis present

## 2019-03-07 DIAGNOSIS — Z8261 Family history of arthritis: Secondary | ICD-10-CM | POA: Diagnosis not present

## 2019-03-07 DIAGNOSIS — I6389 Other cerebral infarction: Secondary | ICD-10-CM | POA: Diagnosis not present

## 2019-03-07 LAB — CBC
HCT: 42.1 % (ref 36.0–46.0)
Hemoglobin: 14.1 g/dL (ref 12.0–15.0)
MCH: 29.4 pg (ref 26.0–34.0)
MCHC: 33.5 g/dL (ref 30.0–36.0)
MCV: 87.9 fL (ref 80.0–100.0)
Platelets: 148 10*3/uL — ABNORMAL LOW (ref 150–400)
RBC: 4.79 MIL/uL (ref 3.87–5.11)
RDW: 13.2 % (ref 11.5–15.5)
WBC: 8.2 10*3/uL (ref 4.0–10.5)
nRBC: 0 % (ref 0.0–0.2)

## 2019-03-07 LAB — I-STAT CHEM 8, ED
BUN: 18 mg/dL (ref 8–23)
Calcium, Ion: 1.14 mmol/L — ABNORMAL LOW (ref 1.15–1.40)
Chloride: 106 mmol/L (ref 98–111)
Creatinine, Ser: 1.4 mg/dL — ABNORMAL HIGH (ref 0.44–1.00)
Glucose, Bld: 120 mg/dL — ABNORMAL HIGH (ref 70–99)
HCT: 41 % (ref 36.0–46.0)
Hemoglobin: 13.9 g/dL (ref 12.0–15.0)
Potassium: 4.2 mmol/L (ref 3.5–5.1)
Sodium: 140 mmol/L (ref 135–145)
TCO2: 22 mmol/L (ref 22–32)

## 2019-03-07 LAB — COMPREHENSIVE METABOLIC PANEL
ALT: 15 U/L (ref 0–44)
AST: 19 U/L (ref 15–41)
Albumin: 4.1 g/dL (ref 3.5–5.0)
Alkaline Phosphatase: 92 U/L (ref 38–126)
Anion gap: 9 (ref 5–15)
BUN: 16 mg/dL (ref 8–23)
CO2: 24 mmol/L (ref 22–32)
Calcium: 9.3 mg/dL (ref 8.9–10.3)
Chloride: 108 mmol/L (ref 98–111)
Creatinine, Ser: 1.53 mg/dL — ABNORMAL HIGH (ref 0.44–1.00)
GFR calc Af Amer: 38 mL/min — ABNORMAL LOW (ref >60)
GFR calc non Af Amer: 33 mL/min — ABNORMAL LOW (ref >60)
Glucose, Bld: 125 mg/dL — ABNORMAL HIGH (ref 70–99)
Potassium: 4.3 mmol/L (ref 3.5–5.1)
Sodium: 141 mmol/L (ref 135–145)
Total Bilirubin: 1 mg/dL (ref 0.3–1.2)
Total Protein: 7 g/dL (ref 6.5–8.1)

## 2019-03-07 LAB — CBG MONITORING, ED: Glucose-Capillary: 119 mg/dL — ABNORMAL HIGH (ref 70–99)

## 2019-03-07 LAB — DIFFERENTIAL
Abs Immature Granulocytes: 0.03 10*3/uL (ref 0.00–0.07)
Basophils Absolute: 0 10*3/uL (ref 0.0–0.1)
Basophils Relative: 1 %
Eosinophils Absolute: 0.4 10*3/uL (ref 0.0–0.5)
Eosinophils Relative: 5 %
Immature Granulocytes: 0 %
Lymphocytes Relative: 27 %
Lymphs Abs: 2.3 10*3/uL (ref 0.7–4.0)
Monocytes Absolute: 0.5 10*3/uL (ref 0.1–1.0)
Monocytes Relative: 6 %
Neutro Abs: 5 10*3/uL (ref 1.7–7.7)
Neutrophils Relative %: 61 %

## 2019-03-07 LAB — GLUCOSE, CAPILLARY
Glucose-Capillary: 126 mg/dL — ABNORMAL HIGH (ref 70–99)
Glucose-Capillary: 238 mg/dL — ABNORMAL HIGH (ref 70–99)

## 2019-03-07 LAB — APTT: aPTT: 41 seconds — ABNORMAL HIGH (ref 24–36)

## 2019-03-07 LAB — PROTIME-INR
INR: 1 (ref 0.8–1.2)
Prothrombin Time: 13.1 seconds (ref 11.4–15.2)

## 2019-03-07 MED ORDER — STROKE: EARLY STAGES OF RECOVERY BOOK
Freq: Once | Status: AC
  Administered 2019-03-07: 18:00:00
  Filled 2019-03-07: qty 1

## 2019-03-07 MED ORDER — MOMETASONE FURO-FORMOTEROL FUM 200-5 MCG/ACT IN AERO
2.0000 | INHALATION_SPRAY | Freq: Two times a day (BID) | RESPIRATORY_TRACT | Status: DC
  Administered 2019-03-07 – 2019-03-09 (×4): 2 via RESPIRATORY_TRACT
  Filled 2019-03-07: qty 8.8

## 2019-03-07 MED ORDER — INSULIN ASPART 100 UNIT/ML ~~LOC~~ SOLN
0.0000 [IU] | Freq: Every day | SUBCUTANEOUS | Status: DC
  Administered 2019-03-07: 2 [IU] via SUBCUTANEOUS
  Administered 2019-03-08: 3 [IU] via SUBCUTANEOUS

## 2019-03-07 MED ORDER — ASPIRIN 325 MG PO TABS
325.0000 mg | ORAL_TABLET | Freq: Every day | ORAL | Status: DC
  Administered 2019-03-07: 325 mg via ORAL
  Filled 2019-03-07: qty 1

## 2019-03-07 MED ORDER — ALBUTEROL SULFATE HFA 108 (90 BASE) MCG/ACT IN AERS: 2.0000 | INHALATION_SPRAY | Freq: Four times a day (QID) | RESPIRATORY_TRACT | Status: DC | PRN

## 2019-03-07 MED ORDER — IOHEXOL 350 MG/ML SOLN
100.0000 mL | Freq: Once | INTRAVENOUS | Status: AC | PRN
  Administered 2019-03-07: 100 mL via INTRAVENOUS

## 2019-03-07 MED ORDER — CLOPIDOGREL BISULFATE 75 MG PO TABS
75.0000 mg | ORAL_TABLET | Freq: Every day | ORAL | Status: DC
  Administered 2019-03-08 – 2019-03-09 (×2): 75 mg via ORAL
  Filled 2019-03-07 (×2): qty 1

## 2019-03-07 MED ORDER — SODIUM CHLORIDE 0.9% FLUSH
3.0000 mL | Freq: Once | INTRAVENOUS | Status: AC
  Administered 2019-03-07: 3 mL via INTRAVENOUS

## 2019-03-07 MED ORDER — LORAZEPAM 2 MG/ML IJ SOLN
0.5000 mg | Freq: Once | INTRAMUSCULAR | Status: AC
  Administered 2019-03-07: 0.5 mg via INTRAVENOUS
  Filled 2019-03-07: qty 1

## 2019-03-07 MED ORDER — ALBUTEROL SULFATE (2.5 MG/3ML) 0.083% IN NEBU: 2.5000 mg | INHALATION_SOLUTION | Freq: Four times a day (QID) | RESPIRATORY_TRACT | Status: DC | PRN

## 2019-03-07 MED ORDER — ACETAMINOPHEN 325 MG PO TABS: 650.0000 mg | ORAL_TABLET | ORAL | Status: DC | PRN

## 2019-03-07 MED ORDER — ASPIRIN 81 MG PO CHEW: 81.0000 mg | CHEWABLE_TABLET | Freq: Every day | ORAL | Status: DC

## 2019-03-07 MED ORDER — ASPIRIN EC 81 MG PO TBEC
81.0000 mg | DELAYED_RELEASE_TABLET | Freq: Every day | ORAL | Status: DC
  Administered 2019-03-09: 81 mg via ORAL
  Filled 2019-03-07 (×2): qty 1

## 2019-03-07 MED ORDER — ACETAMINOPHEN 650 MG RE SUPP: 650.0000 mg | RECTAL | Status: DC | PRN

## 2019-03-07 MED ORDER — PRAVASTATIN SODIUM 40 MG PO TABS
40.0000 mg | ORAL_TABLET | Freq: Every day | ORAL | Status: DC
  Administered 2019-03-08: 40 mg via ORAL
  Filled 2019-03-07: qty 1

## 2019-03-07 MED ORDER — ACETAMINOPHEN 160 MG/5ML PO SOLN: 650.0000 mg | ORAL | Status: DC | PRN

## 2019-03-07 MED ORDER — ENOXAPARIN SODIUM 40 MG/0.4ML ~~LOC~~ SOLN
40.0000 mg | SUBCUTANEOUS | Status: DC
  Administered 2019-03-07 – 2019-03-09 (×3): 40 mg via SUBCUTANEOUS
  Filled 2019-03-07 (×3): qty 0.4

## 2019-03-07 MED ORDER — INSULIN ASPART 100 UNIT/ML ~~LOC~~ SOLN
0.0000 [IU] | Freq: Three times a day (TID) | SUBCUTANEOUS | Status: DC
  Administered 2019-03-07: 2 [IU] via SUBCUTANEOUS
  Administered 2019-03-08: 8 [IU] via SUBCUTANEOUS
  Administered 2019-03-08: 3 [IU] via SUBCUTANEOUS
  Administered 2019-03-08: 11 [IU] via SUBCUTANEOUS
  Administered 2019-03-09: 3 [IU] via SUBCUTANEOUS
  Administered 2019-03-09: 8 [IU] via SUBCUTANEOUS

## 2019-03-07 MED ORDER — ATORVASTATIN CALCIUM 80 MG PO TABS
80.0000 mg | ORAL_TABLET | Freq: Every day | ORAL | Status: DC
  Filled 2019-03-07: qty 1

## 2019-03-07 NOTE — ED Triage Notes (Signed)
Pt awoke 0430 this AM with left sided numbness and weakness. Pt reports tingling and diminished sensation on left arm and left leg. No noted facial palsy on arrival.  A/O x4

## 2019-03-07 NOTE — H&P (Signed)
History and Physical    Julia Banks [id number] DOB: April 15, 1941 DOA: 03/07/2019  PCP: Ria Bush, MD  Patient coming from: Home I have personally briefly reviewed patient's old medical records in [place]  Chief Complaint: Left-sided numbness and weakness started this morning.  HPI: Julia Banks is a 78 y.o. female with medical history significant of hypertension, hyperlipidemia, diabetes, CAD, hypothyroidism, chronic kidney disease stage III, sensory neural hearing loss presents to emergency department with complaining of left-sided numbness and weakness started this morning.  Patient reports that she woke up at 830 and she was unable to move her left side.  She called her niece who called 78 and brought patient to the emergency department for further evaluation and management.  Patient reports mild headache and slurred speech, denies blurry vision, dysphagia, lightheadedness, dizziness, loss of consciousness, head trauma, chest pain, shortness of breath, leg swelling, palpitation, urinary or bowel symptoms.    She lives alone at home and independent on daily life activities.  Denies smoking, alcohol, illicit drug use.  She is compliant with her home medications.  She has family history of ischemic stroke in her mom.  ED Course: Patient comfortable on the bed, not in acute distress, has left-sided weakness.  CT head/CT angiogram obtained in the ED which came back negative for acute changes.  She is out of TPA window.  Evaluated by stroke team/neurology in ED.  Review of Systems: As per HPI otherwise negative.    Past Medical History:  Diagnosis Date  . Arthritis   . Asthma   . Breast cancer, left breast ([place]) 02/2014   DCIS, ER+ PR- initially rec bilat mastectomy with sentinal LN biopsy L axilla but now planning just L mastectomy [name])  . Chronic kidney disease, stage 3 ([place])   . Colon polyps    benign  . Complication of anesthesia    hard to wake up   . Coronary artery disease    30% stenosis reported on previous cardiac catheterization in 2006. This was done in [facility]  . GERD (gastroesophageal reflux disease)   . Headaches, cluster   . Hyperlipidemia   . Hypertension   . Hypothyroidism   . Osteopenia 07/2014   DEXA 0.5 spine, 01.7 R femoral neck  . Pneumonia    years ago  . Secondary hyperparathyroidism of renal origin ([place])    likely  . Sensorineural hearing loss (SNHL) of both ears 01/27/2016   Audiology eval - moderately severe sensorineural hearing loss bilaterally rec amplification (07/2016 - [name] audiologist)  . Sleep apnea    on CPAP  . Thyroid disease   . Type 2 diabetes, uncontrolled, with neuropathy ([place])    followed by endo Dr Buddy Duty    Past Surgical History:  Procedure Laterality Date  . ABDOMINAL HYSTERECTOMY     heavy bleeding, ovaries remain  . APPENDECTOMY    . BREAST BIOPSY Right    benign  . BREAST BIOPSY Left 02/2014   ?DCIS  . CARDIAC CATHETERIZATION  2006   [place], [place].   . CARDIAC CATHETERIZATION  10/2013   mild 2v nonobstructive disease, EF 60%  . CARDIOVASCULAR STRESS TEST  09/2012   WNL ([place])  . CHOLECYSTECTOMY    . COLONOSCOPY  05/2010   2 TAs, diverticulosis, rpt 2 yrs (Dr Elissa Hefty in [place])  . COLONOSCOPY  12/2014   1 TA, rpt 5 yrs Henrene Pastor)  . cortisone right knee  01/04/15  . GALLBLADDER SURGERY    .  MASTECTOMY W/ SENTINEL NODE BIOPSY Left 06/07/2014   DR WAKEFIELD  . SIMPLE MASTECTOMY WITH AXILLARY SENTINEL NODE BIOPSY Left 06/07/2014   Procedure: LEFT TOTAL MASTECTOMY WITH LEFT AXILLARY SENTINEL NODE BIOPSY;  Surgeon: Rolm Bookbinder, MD;  Location: [facility];  Service: General;  Laterality: Left;     reports that she has never smoked. She has never used smokeless tobacco. She reports that she does not drink alcohol or use drugs.  Allergies  Allergen Reactions  . Altace [Ramipril] Other (See Comments)    Upset stomach  . Aspirin Nausea And  Vomiting  . Limonene Diarrhea  . Metformin And Related Other (See Comments)    GI upset    Family History  Problem Relation Age of Onset  . Arthritis Mother   . Hyperlipidemia Mother   . Stroke Mother   . Hypertension Mother   . Diabetes Mother   . Other Mother        parathyroid adenoma  . Arthritis Father   . Hyperlipidemia Father   . CAD Father        MI  . Hypertension Father   . Breast cancer Other   . Ovarian cancer Other   . Leukemia Brother        dx in his 28s  . Colon cancer Maternal Uncle        dx in his 18s  . Ovarian cancer Maternal Grandmother 90  . Melanoma Other 27       dx 2x - 28 and 29/dx 3x - ages 81, 66, and 34  . Cancer Neg Hx   . Crohn's disease Neg Hx   . Colon polyps Neg Hx   . Ulcerative colitis Neg Hx   . Stomach cancer Neg Hx   . Rectal cancer Neg Hx     Prior to Admission medications   Medication Sig Start Date End Date Taking? Authorizing Provider  albuterol (PROVENTIL HFA;VENTOLIN HFA) 108 (90 BASE) MCG/ACT inhaler Inhale into the lungs every 6 (six) hours as needed for wheezing or shortness of breath.    [provider]  Ascorbic Acid (VITAMIN C WITH ROSE HIPS) 500 MG tablet Take 500 mg by mouth daily.    [provider]  BD PEN NEEDLE NANO U/F 32G X 4 MM MISC USE 1 AS DIRECTED TO INJECT MEDICATION DAILY. DX CODE: E11.40, E11.65 10/22/18   Ria Bush, MD  Cholecalciferol (VITAMIN D PO) Take 5,000 Units by mouth.    [provider]  diphenhydrAMINE (BENADRYL) 12.5 MG chewable tablet Chew 12.5 mg by mouth at bedtime as needed for sleep.    [provider]  docusate sodium (COLACE) 100 MG capsule Take 100 mg by mouth 2 (two) times daily.    [provider]  Fluticasone-Salmeterol (ADVAIR) 250-50 MCG/DOSE AEPB Inhale 1 puff into the lungs 2 (two) times daily.    [provider]  furosemide (LASIX) 20 MG tablet Take 1 tablet (20 mg total) by mouth every other day. 04/09/18    Ria Bush, MD  Insulin Glargine (LANTUS SOLOSTAR) 100 UNIT/ML Solostar Pen INJECT 56 UNITS INTO THE SKIN DAILY WITH BREAKFAST 02/18/19   Ria Bush, MD  levothyroxine (SYNTHROID, LEVOTHROID) 25 MCG tablet TAKE 1 TABLET BY MOUTH DAILY. EXCEPT 2 TABLETS ON MON, WED, AND FRI 04/09/18   Ria Bush, MD  losartan (COZAAR) 50 MG tablet Take 1 tablet (50 mg total) by mouth daily. 01/11/19   Ria Bush, MD  metoprolol succinate (TOPROL-XL) 50 MG 24 hr tablet  Take 1 tablet (50 mg total) by mouth daily. 04/09/18   Ria Bush, MD  Omega-3 Fatty Acids (OMEGA 3 PO) Take by mouth. 180 EPA/120DHA    [provider]  OVER THE COUNTER MEDICATION Calcium magnesium 1000mg  day    [provider]  OVER THE COUNTER MEDICATION Women's multivitamin with iron    [provider]  pantoprazole (PROTONIX) 40 MG tablet TAKE 1 TABLET BY MOUTH EVERY DAY 01/05/19   Elby Showers, MD  potassium chloride SA (KLOR-CON M20) 20 MEQ tablet Take 1 tablet (20 mEq total) by mouth daily. 09/30/18   Ria Bush, MD  pravastatin (PRAVACHOL) 40 MG tablet Take 1 tablet (40 mg total) by mouth daily. 04/09/18   Ria Bush, MD  vitamin B-12 (CYANOCOBALAMIN) 1000 MCG tablet Take 1,000 mcg by mouth daily.    [provider]    Physical Exam: Vitals:   03/07/19 1100 03/07/19 1115 03/07/19 1130 03/07/19 1145  BP: (!) 203/70 (!) 205/72 (!) 207/82 (!) 210/84  Pulse: 67 65 69 70  Resp: 19 15 13 15   Temp:      TempSrc:      SpO2: 97% 97% 98% 98%    Constitutional: NAD, calm, comfortable Vitals:   03/07/19 1100 03/07/19 1115 03/07/19 1130 03/07/19 1145  BP: (!) 203/70 (!) 205/72 (!) 207/82 (!) 210/84  Pulse: 67 65 69 70  Resp: 19 15 13 15   Temp:      TempSrc:      SpO2: 97% 97% 98% 98%   Constitutional: Not in acute distress, communicating well, alert and oriented x3.   Eyes: PERRL, lids and conjunctivae normal ENMT: Mucous membranes are moist. Posterior  pharynx clear of any exudate or lesions.Normal dentition.  Neck: normal, supple, no masses, no thyromegaly Respiratory: clear to auscultation bilaterally, no wheezing, no crackles. Normal respiratory effort. No accessory muscle use.  Cardiovascular: Regular rate and rhythm, no murmurs / rubs / gallops. No extremity edema. 2+ pedal pulses. No carotid bruits.  Abdomen: no tenderness, no masses palpated. No hepatosplenomegaly. Bowel sounds positive.  Musculoskeletal: no clubbing / cyanosis. No joint deformity upper and lower extremities. Good ROM, no contractures. Normal muscle tone.  Skin: no rashes, lesions, ulcers. No induration Neurologic: Power: 2 out of 5 in left upper and lower extremities, 5 out of 5 in right upper and lower extremities.  Sensation intact on right side and decreased on the left side.  No facial palsy noted. psychiatric: Normal judgment and insight. Alert and oriented x 3. Normal mood.    Labs on Admission: I have personally reviewed following labs and imaging studies  CBC: Recent Labs  Lab 03/07/19 0959 03/07/19 1003  WBC 8.2  --   NEUTROABS 5.0  --   HGB 14.1 13.9  HCT 42.1 41.0  MCV 87.9  --   PLT 148*  --    Basic Metabolic Panel: Recent Labs  Lab 03/07/19 0959 03/07/19 1003  NA 141 140  K 4.3 4.2  CL 108 106  CO2 24  --   GLUCOSE 125* 120*  BUN 16 18  CREATININE 1.53* 1.40*  CALCIUM 9.3  --    GFR: CrCl cannot be calculated (Unknown ideal weight.). Liver Function Tests: Recent Labs  Lab 03/07/19 0959  AST 19  ALT 15  ALKPHOS 92  BILITOT 1.0  PROT 7.0  ALBUMIN 4.1   No results for input(s): LIPASE, AMYLASE in the last 168 hours. No results for input(s): AMMONIA in the last 168 hours. Coagulation  Profile: Recent Labs  Lab 03/07/19 0959  INR 1.0   Cardiac Enzymes: No results for input(s): CKTOTAL, CKMB, CKMBINDEX, TROPONINI in the last 168 hours. BNP (last 3 results) No results for input(s): PROBNP in the last 8760 hours. HbA1C:  No results for input(s): HGBA1C in the last 72 hours. CBG: Recent Labs  Lab 03/07/19 0958  GLUCAP 119*   Lipid Profile: No results for input(s): CHOL, HDL, LDLCALC, TRIG, CHOLHDL, LDLDIRECT in the last 72 hours. Thyroid Function Tests: No results for input(s): TSH, T4TOTAL, FREET4, T3FREE, THYROIDAB in the last 72 hours. Anemia Panel: No results for input(s): VITAMINB12, FOLATE, FERRITIN, TIBC, IRON, RETICCTPCT in the last 72 hours. Urine analysis:    Component Value Date/Time   COLORURINE YELLOW 05/31/2014 [place] 05/31/2014 1059   LABSPEC 1.017 05/31/2014 1059   PHURINE 6.0 05/31/2014 1059   GLUCOSEU NEGATIVE 05/31/2014 1059   HGBUR NEGATIVE 05/31/2014 1059   BILIRUBINUR NEGATIVE 05/31/2014 1059   KETONESUR NEGATIVE 05/31/2014 1059   PROTEINUR NEGATIVE 05/31/2014 1059   UROBILINOGEN 0.2 05/31/2014 1059   NITRITE NEGATIVE 05/31/2014 1059   LEUKOCYTESUR NEGATIVE 05/31/2014 1059    Radiological Exams on Admission: Ct Angio Head W Or Wo Contrast  Result Date: 03/07/2019 CLINICAL DATA:  Left-sided weakness EXAM: CT ANGIOGRAPHY HEAD AND NECK TECHNIQUE: Multidetector CT imaging of the head and neck was performed using the standard protocol during bolus administration of intravenous contrast. Multiplanar CT image reconstructions and MIPs were obtained to evaluate the vascular anatomy. Carotid stenosis measurements (when applicable) are obtained utilizing NASCET criteria, using the distal internal carotid diameter as the denominator. CONTRAST:  134mL OMNIPAQUE IOHEXOL 350 MG/ML SOLN COMPARISON:  None. FINDINGS: CTA NECK FINDINGS Aortic arch: Mild atherosclerotic plaque.  Three vessel branching. Right carotid system: Mixed density plaque at the ICA bulb/bifurcation with 25% ICA origin narrowing. Negative for ulceration or beading. Left carotid system: Mild atherosclerotic plaque at the bifurcation. No stenosis or ulceration. Vertebral arteries: Proximal subclavian  atherosclerosis without flow limiting stenosis. Strong right vertebral artery dominance. No acquired vertebral stenosis. Skeleton: Spondylosis with bridging cervical and upper thoracic osteophytes. Other neck: Negative Upper chest: Negative Review of the MIP images confirms the above findings CTA HEAD FINDINGS Anterior circulation: Atherosclerotic calcification on the carotid siphons. No branch occlusion or proximal flow limiting stenosis. Negative for aneurysm. Posterior circulation: Strong right vertebral artery dominance. Most left vertebral flow is to the PICA. Basilar is diffusely patent and small in the setting of bilateral fetal type PCA. No branch occlusion. Negative for aneurysm Venous sinuses: Diffusely patent Anatomic variants: As above Review of the MIP images confirms the above findings IMPRESSION: 1. No emergent finding. 2. Atherosclerosis without flow limiting stenosis in the head and neck. Electronically Signed   By: Monte Fantasia M.D.   On: 03/07/2019 10:54   Ct Angio Neck W Or Wo Contrast  Result Date: 03/07/2019 CLINICAL DATA:  Left-sided weakness EXAM: CT ANGIOGRAPHY HEAD AND NECK TECHNIQUE: Multidetector CT imaging of the head and neck was performed using the standard protocol during bolus administration of intravenous contrast. Multiplanar CT image reconstructions and MIPs were obtained to evaluate the vascular anatomy. Carotid stenosis measurements (when applicable) are obtained utilizing NASCET criteria, using the distal internal carotid diameter as the denominator. CONTRAST:  11mL OMNIPAQUE IOHEXOL 350 MG/ML SOLN COMPARISON:  None. FINDINGS: CTA NECK FINDINGS Aortic arch: Mild atherosclerotic plaque.  Three vessel branching. Right carotid system: Mixed density plaque at the ICA bulb/bifurcation with 25% ICA origin narrowing. Negative for  ulceration or beading. Left carotid system: Mild atherosclerotic plaque at the bifurcation. No stenosis or ulceration. Vertebral arteries: Proximal  subclavian atherosclerosis without flow limiting stenosis. Strong right vertebral artery dominance. No acquired vertebral stenosis. Skeleton: Spondylosis with bridging cervical and upper thoracic osteophytes. Other neck: Negative Upper chest: Negative Review of the MIP images confirms the above findings CTA HEAD FINDINGS Anterior circulation: Atherosclerotic calcification on the carotid siphons. No branch occlusion or proximal flow limiting stenosis. Negative for aneurysm. Posterior circulation: Strong right vertebral artery dominance. Most left vertebral flow is to the PICA. Basilar is diffusely patent and small in the setting of bilateral fetal type PCA. No branch occlusion. Negative for aneurysm Venous sinuses: Diffusely patent Anatomic variants: As above Review of the MIP images confirms the above findings IMPRESSION: 1. No emergent finding. 2. Atherosclerosis without flow limiting stenosis in the head and neck. Electronically Signed   By: Monte Fantasia M.D.   On: 03/07/2019 10:54   Ct Head Code Stroke Wo Contrast  Result Date: 03/07/2019 CLINICAL DATA:  Code stroke.  Left-sided weakness EXAM: CT HEAD WITHOUT CONTRAST TECHNIQUE: Contiguous axial images were obtained from the base of the skull through the vertex without intravenous contrast. COMPARISON:  None. FINDINGS: Brain: No evidence of acute infarction, hemorrhage, hydrocephalus, extra-axial collection or mass lesion/mass effect. Vascular: No hyperdense vessel.  Atherosclerotic calcification. Skull: Normal. Negative for fracture or focal lesion. Sinuses/Orbits: Essentially clear sinuses. Bone island or other benign process posterior to the right sphenoid sinus. Other: These results were communicated to Dr. Rory Percy at 10:11 amon 9/7/2020by text page via the [facility] messaging system. ASPECTS [facility] Stroke Program Early CT Score) - Ganglionic level infarction (caudate, lentiform nuclei, internal capsule, insula, M1-M3 cortex): 7 - Supraganglionic infarction  (M4-M6 cortex): 3 Total score (0-10 with 10 being normal): 10 IMPRESSION: 1. No acute finding. 2. ASPECTS is 10. Electronically Signed   By: Monte Fantasia M.D.   On: 03/07/2019 10:12    EKG: Normal sinus rhythm, no acute ST-T wave changes noted.  Assessment/Plan Principal Problem:   Ischemic stroke [facility]) Active Problems:   Type 2 diabetes, uncontrolled, with neuropathy (HCC)   Essential hypertension, benign   HLD (hyperlipidemia)   GERD (gastroesophageal reflux disease)   Hypothyroidism   CKD (chronic kidney disease), stage III ([facility])   Left-sided weakness: -Likely secondary to ischemic CVA -Patient has past medical history of hypertension, diabetes, hyperlipidemia. -CT head, CT angios of head and neck came back negative for acute findings. -Patient was evaluated by stroke team/neurology-patient is out of TPA window -Admit patient in progressive unit. -Keep her n.p.o. until passes bedside swallow eval, neuro checks every 4 hours. -Aspirin 325 mg and atorvastatin 80 mg. -Ordered MRI brain without contrast, ultrasound of carotids and transthoracic echo -Placed on telemetry, consulted PT/OT/ST -We will allow permissive hypertension up to 220/120, monitor blood pressure closely. -Ordered A1c and lipid panel tomorrow a.m.  Hypertension: Blood pressure is on higher side -We will allow permissive hypertension -Monitor blood pressure closely.  Type 2 diabetes mellitus: Last A1c 7.2 checked on 11/01/2018.   -Check A1c -Started on sliding scale insulin.  Hyperlipidemia: -Started on atorvastatin 80 mg daily -Check lipid panel.  CKD stage III: Stable -Monitor renal function, avoid nephrotoxic medications.  Hypothyroidism: -Check TSH, will resume home medication if she passes bedside swallow eval.   DVT prophylaxis: Lovenox, SCD Code Status: Full code  family Communication: Neice present at bedside.  Plan of care discussed with patient and his niece in length and he verbalized  understanding and agreed with it. Disposition Plan: To be determined Consults called: Neurology Dr. Rory Percy Admission status: Observation  Mckinley Jewel MD [facility] Pager [phone]  If 7PM-7AM, please contact night-coverage [email or web address] Password [email or web address]  03/07/2019, 12:50 PM

## 2019-03-07 NOTE — ED Notes (Addendum)
Daughter at bedside.

## 2019-03-07 NOTE — Plan of Care (Signed)
  Problem: Activity: Goal: Risk for activity intolerance will decrease 03/07/2019 2224 by Dorena Cookey, LPN Outcome: Progressing 03/07/2019 2152 by Dorena Cookey, LPN Outcome: Progressing   Problem: Activity: Goal: Risk for activity intolerance will decrease 03/07/2019 2224 by Dorena Cookey, LPN Outcome: Progressing 03/07/2019 2152 by Dorena Cookey, LPN Outcome: Progressing   Problem: Elimination: Goal: Will not experience complications related to bowel motility 03/07/2019 2224 by Dorena Cookey, LPN Outcome: Progressing 03/07/2019 2152 by Dorena Cookey, LPN Outcome: Progressing Goal: Will not experience complications related to urinary retention 03/07/2019 2224 by Dorena Cookey, LPN Outcome: Progressing 03/07/2019 2152 by Dorena Cookey, LPN Outcome: Progressing   Problem: Pain Managment: Goal: General experience of comfort will improve 03/07/2019 2224 by Dorena Cookey, LPN Outcome: Progressing 03/07/2019 2152 by Dorena Cookey, LPN Outcome: Progressing   Problem: Safety: Goal: Ability to remain free from injury will improve 03/07/2019 2224 by Dorena Cookey, LPN Outcome: Progressing 03/07/2019 2152 by Dorena Cookey, LPN Outcome: Progressing   Problem: Skin Integrity: Goal: Risk for impaired skin integrity will decrease 03/07/2019 2224 by Dorena Cookey, LPN Outcome: Progressing 03/07/2019 2152 by Dorena Cookey, LPN Outcome: Progressing   Problem: Education: Goal: Knowledge of disease or condition will improve 03/07/2019 2224 by Dorena Cookey, LPN Outcome: Progressing 03/07/2019 2152 by Dorena Cookey, LPN Outcome: Progressing Goal: Knowledge of secondary prevention will improve 03/07/2019 2224 by Dorena Cookey, LPN Outcome: Progressing 03/07/2019 2152 by Dorena Cookey, LPN Outcome: Progressing Goal: Knowledge of patient specific risk factors addressed and post discharge goals established will improve 03/07/2019 2224 by Dorena Cookey, LPN Outcome: Progressing 03/07/2019 2152 by Dorena Cookey, LPN Outcome: Progressing   Problem: Ischemic Stroke/TIA Tissue Perfusion: Goal: Complications of ischemic stroke/TIA will be minimized 03/07/2019 2224 by Dorena Cookey, LPN Outcome: Progressing 03/07/2019 2152 by Dorena Cookey, LPN Outcome: Progressing   Problem: Education: Goal: Knowledge of secondary prevention will improve Outcome: Progressing   Problem: Education: Goal: Knowledge of secondary prevention will improve Outcome: Progressing

## 2019-03-07 NOTE — Progress Notes (Signed)
Pt refused to take Lipitor. Pt states that it causes her muscles to be very weak and causes a lot of pain in her lower extremities. Educated the patient on the importance of taking this medication. Will continue to educate.

## 2019-03-07 NOTE — Plan of Care (Signed)
  Problem: Clinical Measurements: Goal: Will remain free from infection Outcome: Progressing   Problem: Activity: Goal: Risk for activity intolerance will decrease Outcome: Progressing   Problem: Elimination: Goal: Will not experience complications related to urinary retention Outcome: Progressing   

## 2019-03-07 NOTE — ED Provider Notes (Signed)
[facility] EMERGENCY DEPARTMENT Provider Note   CSN: [id number] Arrival date & time: 03/07/19  [id number]  An emergency department physician performed an initial assessment on this suspected stroke patient at 1000.  History   Chief Complaint Chief Complaint  Patient presents with   Cerebrovascular Accident    HPI Julia Banks is a 78 y.o. female.     The history is provided by the patient and medical records. No language interpreter was used.  Neurologic Problem This is a new problem. The current episode started 12 to 24 hours ago. The problem occurs constantly. The problem has not changed since onset.Pertinent negatives include no chest pain, no abdominal pain, no headaches and no shortness of breath. Nothing aggravates the symptoms. Nothing relieves the symptoms. She has tried nothing for the symptoms. The treatment provided no relief.    Past Medical History:  Diagnosis Date   Arthritis    Asthma    Breast cancer, left breast ([place]) 02/2014   DCIS, ER+ PR- initially rec bilat mastectomy with sentinal LN biopsy L axilla but now planning just L mastectomy [name])   Chronic kidney disease, stage 3 (HCC)    Colon polyps    benign   Complication of anesthesia    hard to wake up   Coronary artery disease    30% stenosis reported on previous cardiac catheterization in 2006. This was done in [facility]   GERD (gastroesophageal reflux disease)    Headaches, cluster    Hyperlipidemia    Hypertension    Hypothyroidism    Osteopenia 07/2014   DEXA 0.5 spine, 01.7 R femoral neck   Pneumonia    years ago   Secondary hyperparathyroidism of renal origin ([place])    likely   Sensorineural hearing loss (SNHL) of both ears 01/27/2016   Audiology eval - moderately severe sensorineural hearing loss bilaterally rec amplification (07/2016 - [facility] [name] audiologist)   Sleep apnea    on CPAP   Thyroid disease    Type 2  diabetes, uncontrolled, with neuropathy ([place])    followed by endo Dr Buddy Duty    Patient Active Problem List   Diagnosis Date Noted   Advanced care planning/counseling discussion 01/27/2016   Sensorineural hearing loss (SNHL) of both ears 01/27/2016   Chest discomfort 01/27/2016   Insomnia 02/13/2015   Chronic cough 10/10/2014   Osteopenia 07/31/2014   S/P mastectomy 06/07/2014   Genetic testing 06/02/2014   Breast cancer of upper-outer quadrant of left female breast ([place]) 03/28/2014   Grieving 10/25/2013   Secondary hyperparathyroidism of renal origin ([place])    Medicare annual wellness visit, subsequent 06/28/2013   Diabetes mellitus with stage 3 chronic kidney disease ([place])    Hypothyroidism 04/13/2013   GERD (gastroesophageal reflux disease)    Essential hypertension, benign 09/17/2012   HLD (hyperlipidemia) 09/17/2012   Type 2 diabetes, uncontrolled, with neuropathy ([place]) 02/15/2012   OSA on CPAP 02/13/2012   Coronary artery disease 02/13/2012    Past Surgical History:  Procedure Laterality Date   ABDOMINAL HYSTERECTOMY     heavy bleeding, ovaries remain   APPENDECTOMY     BREAST BIOPSY Right    benign   BREAST BIOPSY Left 02/2014   ?DCIS   CARDIAC CATHETERIZATION  2006   [place], [place] CATHETERIZATION  10/2013   mild 2v nonobstructive disease, EF 60%   CARDIOVASCULAR STRESS TEST  09/2012   WNL ([place])   CHOLECYSTECTOMY  COLONOSCOPY  05/2010   2 TAs, diverticulosis, rpt 2 yrs (Dr Elissa Hefty in [place])   [place]  12/2014   1 TA, rpt 5 yrs Henrene Pastor)   cortisone right knee  01/04/15   GALLBLADDER SURGERY     MASTECTOMY W/ SENTINEL NODE BIOPSY Left 06/07/2014   DR WAKEFIELD   SIMPLE MASTECTOMY WITH AXILLARY SENTINEL NODE BIOPSY Left 06/07/2014   Procedure: LEFT TOTAL MASTECTOMY WITH LEFT AXILLARY SENTINEL NODE BIOPSY;  Surgeon: Rolm Bookbinder, MD;  Location: [facility];  Service: General;  Laterality: Left;     OB History     No obstetric history on file.      Home Medications    Prior to Admission medications   Medication Sig Start Date End Date Taking? Authorizing Provider  albuterol (PROVENTIL HFA;VENTOLIN HFA) 108 (90 BASE) MCG/ACT inhaler Inhale into the lungs every 6 (six) hours as needed for wheezing or shortness of breath.    [provider]  Ascorbic Acid (VITAMIN C WITH ROSE HIPS) 500 MG tablet Take 500 mg by mouth daily.    [provider]  BD PEN NEEDLE NANO U/F 32G X 4 MM MISC USE 1 AS DIRECTED TO INJECT MEDICATION DAILY. DX CODE: E11.40, E11.65 10/22/18   Ria Bush, MD  Cholecalciferol (VITAMIN D PO) Take 5,000 Units by mouth.    [provider]  diphenhydrAMINE (BENADRYL) 12.5 MG chewable tablet Chew 12.5 mg by mouth at bedtime as needed for sleep.    [provider]  docusate sodium (COLACE) 100 MG capsule Take 100 mg by mouth 2 (two) times daily.    [provider]  Fluticasone-Salmeterol (ADVAIR) 250-50 MCG/DOSE AEPB Inhale 1 puff into the lungs 2 (two) times daily.    [provider]  furosemide (LASIX) 20 MG tablet Take 1 tablet (20 mg total) by mouth every other day. 04/09/18   Ria Bush, MD  Insulin Glargine (LANTUS SOLOSTAR) 100 UNIT/ML Solostar Pen INJECT 56 UNITS INTO THE SKIN DAILY WITH BREAKFAST 02/18/19   Ria Bush, MD  levothyroxine (SYNTHROID, LEVOTHROID) 25 MCG tablet TAKE 1 TABLET BY MOUTH DAILY. EXCEPT 2 TABLETS ON MON, WED, AND FRI 04/09/18   Ria Bush, MD  losartan (COZAAR) 50 MG tablet Take 1 tablet (50 mg total) by mouth daily. 01/11/19   Ria Bush, MD  metoprolol succinate (TOPROL-XL) 50 MG 24 hr tablet Take 1 tablet (50 mg total) by mouth daily. 04/09/18   Ria Bush, MD  Omega-3 Fatty Acids (OMEGA 3 PO) Take by mouth. 180 EPA/120DHA    [provider]  OVER THE COUNTER MEDICATION Calcium magnesium 1000mg  day    [provider]  OVER THE COUNTER MEDICATION  Women's multivitamin with iron    [provider]  pantoprazole (PROTONIX) 40 MG tablet TAKE 1 TABLET BY MOUTH EVERY DAY 01/05/19   Elby Showers, MD  potassium chloride SA (KLOR-CON M20) 20 MEQ tablet Take 1 tablet (20 mEq total) by mouth daily. 09/30/18   Ria Bush, MD  pravastatin (PRAVACHOL) 40 MG tablet Take 1 tablet (40 mg total) by mouth daily. 04/09/18   Ria Bush, MD  vitamin B-12 (CYANOCOBALAMIN) 1000 MCG tablet Take 1,000 mcg by mouth daily.    [provider]    Family History Family History  Problem Relation Age of Onset   Arthritis Mother    Hyperlipidemia Mother    Stroke Mother    Hypertension Mother    Diabetes Mother    Other Mother  parathyroid adenoma   Arthritis Father    Hyperlipidemia Father    CAD Father        MI   Hypertension Father    Breast cancer Other    Ovarian cancer Other    Leukemia Brother        dx in his 67s   Colon cancer Maternal Uncle        dx in his 49s   Ovarian cancer Maternal Grandmother 90   Melanoma Other 58       dx 2x - 54 and 29/dx 3x - ages 71, 60, and 77   Cancer Neg Hx    Crohn's disease Neg Hx    Colon polyps Neg Hx    Ulcerative colitis Neg Hx    Stomach cancer Neg Hx    Rectal cancer Neg Hx     Social History Social History   Tobacco Use   Smoking status: Never Smoker   Smokeless tobacco: Never Used   Tobacco comment: second hand exposure  Substance Use Topics   Alcohol use: No   Drug use: No     Allergies   Altace [ramipril], Aspirin, Limonene, and Metformin and related   Review of Systems Review of Systems  Constitutional: Negative for chills, diaphoresis, fatigue and fever.  HENT: Negative for congestion.   Respiratory: Negative for cough, chest tightness, shortness of breath and wheezing.   Cardiovascular: Negative for chest pain and palpitations.  Gastrointestinal: Negative for abdominal pain, constipation, diarrhea, nausea and  vomiting.  Genitourinary: Negative for dysuria, flank pain and frequency.  Musculoskeletal: Negative for back pain, neck pain and neck stiffness.  Skin: Negative for rash and wound.  Neurological: Positive for weakness and numbness. Negative for dizziness, syncope, speech difficulty, light-headedness and headaches.  Psychiatric/Behavioral: Negative for agitation and confusion.  All other systems reviewed and are negative.    Physical Exam Updated Vital Signs BP (!) 203/70    Pulse 67    Temp 98.2 F (36.8 C) (Oral)    Resp 19    SpO2 97%   Physical Exam Vitals signs and nursing note reviewed.  Constitutional:      General: She is not in acute distress.    Appearance: She is well-developed. She is not ill-appearing, toxic-appearing or diaphoretic.  HENT:     Head: Normocephalic and atraumatic.     Right Ear: External ear normal.     Left Ear: External ear normal.     Nose: Nose normal. No congestion or rhinorrhea.     Mouth/Throat:     Mouth: Mucous membranes are moist.     Pharynx: No oropharyngeal exudate or posterior oropharyngeal erythema.  Eyes:     Conjunctiva/sclera: Conjunctivae normal.     Pupils: Pupils are equal, round, and reactive to light.  Neck:     Musculoskeletal: Normal range of motion and neck supple. No muscular tenderness.  Cardiovascular:     Rate and Rhythm: Normal rate.     Pulses: Normal pulses.     Heart sounds: No murmur.  Pulmonary:     Effort: Pulmonary effort is normal. No respiratory distress.     Breath sounds: No stridor. No wheezing, rhonchi or rales.  Chest:     Chest wall: No tenderness.  Abdominal:     General: Abdomen is flat. There is no distension.     Tenderness: There is no abdominal tenderness. There is no rebound.  Musculoskeletal:        General: No tenderness.  Right lower leg: No edema.     Left lower leg: No edema.  Skin:    General: Skin is warm.     Findings: No erythema or rash.  Neurological:     Mental Status:  She is alert.     Cranial Nerves: No dysarthria.     Sensory: Sensory deficit present.     Motor: Weakness present. No tremor or abnormal muscle tone.     Deep Tendon Reflexes: Reflexes are normal and symmetric.     Comments: L arm and L leg weakness.   Psychiatric:        Mood and Affect: Mood normal.      ED Treatments / Results  Labs (all labs ordered are listed, but only abnormal results are displayed) Labs Reviewed  APTT - Abnormal; Notable for the following components:      Result Value   aPTT 41 (*)    All other components within normal limits  CBC - Abnormal; Notable for the following components:   Platelets 148 (*)    All other components within normal limits  COMPREHENSIVE METABOLIC PANEL - Abnormal; Notable for the following components:   Glucose, Bld 125 (*)    Creatinine, Ser 1.53 (*)    GFR calc non Af Amer 33 (*)    GFR calc Af Amer 38 (*)    All other components within normal limits  GLUCOSE, CAPILLARY - Abnormal; Notable for the following components:   Glucose-Capillary 126 (*)    All other components within normal limits  I-STAT CHEM 8, ED - Abnormal; Notable for the following components:   Creatinine, Ser 1.40 (*)    Glucose, Bld 120 (*)    Calcium, Ion 1.14 (*)    All other components within normal limits  CBG MONITORING, ED - Abnormal; Notable for the following components:   Glucose-Capillary 119 (*)    All other components within normal limits  SARS CORONAVIRUS 2 (TAT 6-24 HRS)  PROTIME-INR  DIFFERENTIAL    EKG EKG Interpretation  Date/Time:  Monday March 07 2019 10:45:13 EDT Ventricular Rate:  71 PR Interval:    QRS Duration: 86 QT Interval:  422 QTC Calculation: 459 R Axis:   84 Text Interpretation:  Sinus rhythm Borderline right axis deviation Nonspecific T abnrm, anterolateral leads No prior ECG for comparison.  No STEMI Confirmed by Antony Blackbird [phone]) on 03/07/2019 12:04:49 PM   Radiology Ct Angio Head W Or Wo  Contrast  Result Date: 03/07/2019 CLINICAL DATA:  Left-sided weakness EXAM: CT ANGIOGRAPHY HEAD AND NECK TECHNIQUE: Multidetector CT imaging of the head and neck was performed using the standard protocol during bolus administration of intravenous contrast. Multiplanar CT image reconstructions and MIPs were obtained to evaluate the vascular anatomy. Carotid stenosis measurements (when applicable) are obtained utilizing NASCET criteria, using the distal internal carotid diameter as the denominator. CONTRAST:  168mL OMNIPAQUE IOHEXOL 350 MG/ML SOLN COMPARISON:  None. FINDINGS: CTA NECK FINDINGS Aortic arch: Mild atherosclerotic plaque.  Three vessel branching. Right carotid system: Mixed density plaque at the ICA bulb/bifurcation with 25% ICA origin narrowing. Negative for ulceration or beading. Left carotid system: Mild atherosclerotic plaque at the bifurcation. No stenosis or ulceration. Vertebral arteries: Proximal subclavian atherosclerosis without flow limiting stenosis. Strong right vertebral artery dominance. No acquired vertebral stenosis. Skeleton: Spondylosis with bridging cervical and upper thoracic osteophytes. Other neck: Negative Upper chest: Negative Review of the MIP images confirms the above findings CTA HEAD FINDINGS Anterior circulation: Atherosclerotic calcification on the carotid siphons.  No branch occlusion or proximal flow limiting stenosis. Negative for aneurysm. Posterior circulation: Strong right vertebral artery dominance. Most left vertebral flow is to the PICA. Basilar is diffusely patent and small in the setting of bilateral fetal type PCA. No branch occlusion. Negative for aneurysm Venous sinuses: Diffusely patent Anatomic variants: As above Review of the MIP images confirms the above findings IMPRESSION: 1. No emergent finding. 2. Atherosclerosis without flow limiting stenosis in the head and neck. Electronically Signed   By: Monte Fantasia M.D.   On: 03/07/2019 10:54   Ct Angio Neck  W Or Wo Contrast  Result Date: 03/07/2019 CLINICAL DATA:  Left-sided weakness EXAM: CT ANGIOGRAPHY HEAD AND NECK TECHNIQUE: Multidetector CT imaging of the head and neck was performed using the standard protocol during bolus administration of intravenous contrast. Multiplanar CT image reconstructions and MIPs were obtained to evaluate the vascular anatomy. Carotid stenosis measurements (when applicable) are obtained utilizing NASCET criteria, using the distal internal carotid diameter as the denominator. CONTRAST:  115mL OMNIPAQUE IOHEXOL 350 MG/ML SOLN COMPARISON:  None. FINDINGS: CTA NECK FINDINGS Aortic arch: Mild atherosclerotic plaque.  Three vessel branching. Right carotid system: Mixed density plaque at the ICA bulb/bifurcation with 25% ICA origin narrowing. Negative for ulceration or beading. Left carotid system: Mild atherosclerotic plaque at the bifurcation. No stenosis or ulceration. Vertebral arteries: Proximal subclavian atherosclerosis without flow limiting stenosis. Strong right vertebral artery dominance. No acquired vertebral stenosis. Skeleton: Spondylosis with bridging cervical and upper thoracic osteophytes. Other neck: Negative Upper chest: Negative Review of the MIP images confirms the above findings CTA HEAD FINDINGS Anterior circulation: Atherosclerotic calcification on the carotid siphons. No branch occlusion or proximal flow limiting stenosis. Negative for aneurysm. Posterior circulation: Strong right vertebral artery dominance. Most left vertebral flow is to the PICA. Basilar is diffusely patent and small in the setting of bilateral fetal type PCA. No branch occlusion. Negative for aneurysm Venous sinuses: Diffusely patent Anatomic variants: As above Review of the MIP images confirms the above findings IMPRESSION: 1. No emergent finding. 2. Atherosclerosis without flow limiting stenosis in the head and neck. Electronically Signed   By: Monte Fantasia M.D.   On: 03/07/2019 10:54   Mr  Brain Wo Contrast  Result Date: 03/07/2019 CLINICAL DATA:  Left-sided weakness this morning EXAM: MRI HEAD WITHOUT CONTRAST TECHNIQUE: Multiplanar, multiecho pulse sequences of the brain and surrounding structures were obtained without intravenous contrast. COMPARISON:  Head CT and CTA from earlier today FINDINGS: Brain: Acute perforator infarct on the right affecting corona radiata, posterior caudate, and upper putamen. No pre-existing infarct. No hemorrhage, hydrocephalus, collection, or masslike finding Vascular: No change from prior CTA. Skull and upper cervical spine: Negative for marrow lesion. Sinuses/Orbits: Negative IMPRESSION: Acute perforator infarct at the right basal ganglia and corona radiata. Electronically Signed   By: Monte Fantasia M.D.   On: 03/07/2019 14:31   Ct Head Code Stroke Wo Contrast  Result Date: 03/07/2019 CLINICAL DATA:  Code stroke.  Left-sided weakness EXAM: CT HEAD WITHOUT CONTRAST TECHNIQUE: Contiguous axial images were obtained from the base of the skull through the vertex without intravenous contrast. COMPARISON:  None. FINDINGS: Brain: No evidence of acute infarction, hemorrhage, hydrocephalus, extra-axial collection or mass lesion/mass effect. Vascular: No hyperdense vessel.  Atherosclerotic calcification. Skull: Normal. Negative for fracture or focal lesion. Sinuses/Orbits: Essentially clear sinuses. Bone island or other benign process posterior to the right sphenoid sinus. Other: These results were communicated to Dr. Rory Percy at 10:11 amon 9/7/2020by text page via the [facility]  messaging system. ASPECTS [facility] Stroke Program Early CT Score) - Ganglionic level infarction (caudate, lentiform nuclei, internal capsule, insula, M1-M3 cortex): 7 - Supraganglionic infarction (M4-M6 cortex): 3 Total score (0-10 with 10 being normal): 10 IMPRESSION: 1. No acute finding. 2. ASPECTS is 10. Electronically Signed   By: Monte Fantasia M.D.   On: 03/07/2019 10:12     Procedures Procedures (including critical care time)  Medications Ordered in ED Medications  sodium chloride flush (NS) 0.9 % injection 3 mL (has no administration in time range)   stroke: mapping our early stages of recovery book (has no administration in time range)  acetaminophen (TYLENOL) tablet 650 mg (has no administration in time range)    Or  acetaminophen (TYLENOL) solution 650 mg (has no administration in time range)    Or  acetaminophen (TYLENOL) suppository 650 mg (has no administration in time range)  enoxaparin (LOVENOX) injection 40 mg (has no administration in time range)  insulin aspart (novoLOG) injection 0-15 Units (has no administration in time range)  insulin aspart (novoLOG) injection 0-5 Units (has no administration in time range)  aspirin tablet 325 mg (325 mg Oral Given 03/07/19 1310)  atorvastatin (LIPITOR) tablet 80 mg (has no administration in time range)  mometasone-formoterol (DULERA) 200-5 MCG/ACT inhaler 2 puff (2 puffs Inhalation Not Given 03/07/19 1349)  albuterol (PROVENTIL) (2.5 MG/3ML) 0.083% nebulizer solution 2.5 mg (has no administration in time range)  iohexol (OMNIPAQUE) 350 MG/ML injection 100 mL (100 mLs Intravenous Contrast Given 03/07/19 1026)  LORazepam (ATIVAN) injection 0.5 mg (0.5 mg Intravenous Given 03/07/19 1341)     Initial Impression / Assessment and Plan / ED Course  I have reviewed the triage vital signs and the nursing notes.  Pertinent labs & imaging results that were available during my care of the patient were reviewed by me and considered in my medical decision making (see chart for details).        Julia Banks is a 78 y.o. female past medical history significant for CKD who presents as a code stroke.  According to EMS, patient was last normal yesterday and has left-sided numbness and weakness in her arm and leg.  No facial symptoms.  I assessed the patient at the bridge on arrival and her airway is clear.  Patient  taken to CT scanner for further management.  Neurology saw the patient and do not feel she is a candidate for TPA at this time but agree with admission for likely stroke.  On my exam, patient does have numbness and weakness in her left arm and left leg compared to right.  Clear speech, normal extraocular movements.  No facial droop seen.  Lungs clear chest nontender.  Abdomen nontender.  She denied any preceding symptoms.   Patient admitted to medicine for further management of stroke.                  Final Clinical Impressions(s) / ED Diagnoses   Final diagnoses:  Acute left-sided weakness  Left sided numbness    ED Discharge Orders    None     Clinical Impression: 1. Acute left-sided weakness   2. Left sided numbness     Disposition: Admit  This note was prepared with assistance of Dragon voice recognition software. Occasional wrong-word or sound-a-like substitutions may have occurred due to the inherent limitations of voice recognition software.     Russie Gulledge, Gwenyth Allegra, MD 03/07/19 [phone]

## 2019-03-07 NOTE — ED Notes (Addendum)
ED TO INPATIENT HANDOFF REPORT  ED Nurse Name and Phone #:  Chesley Noon [phone]  S Name/Age/Gender Julia Banks 78 y.o. female Room/Bed: 057C/057C  Code Status   Code Status: Full Code  Home/SNF/Other Home Patient oriented to: self, place, time and situation Is this baseline? Yes   Triage Complete: Triage complete  Chief Complaint code stroke  Triage Note Pt awoke 0430 this AM with left sided numbness and weakness. Pt reports tingling and diminished sensation on left arm and left leg. No noted facial palsy on arrival.  A/O x4   Allergies Allergies  Allergen Reactions  . Altace [Ramipril] Other (See Comments)    Upset stomach  . Aspirin Nausea And Vomiting  . Limonene Diarrhea    Present in GI Prep  . Metformin And Related Other (See Comments)    GI upset    Level of Care/Admitting Diagnosis ED Disposition    ED Disposition Condition Comment   Admit  Hospital Area: [place] [[id number]]  Level of Care: Progressive [102]  I expect the patient will be discharged within 24 hours: No (not a candidate for 5C-Observation unit)  Covid Evaluation: Asymptomatic Screening Protocol (No Symptoms)  [facility]: [id number]  Admitting Physician: Mckinley Jewel [id number]  Attending Physician: Mckinley Jewel [id number]  PT Class (Do Not Modify): Observation [104]  PT Acc Code (Do Not Modify): Observation [10022]       B Medical/Surgery History Past Medical History:  Diagnosis Date  . Arthritis   . Asthma   . Breast cancer, left breast ([place]) 02/2014   DCIS, ER+ PR- initially rec bilat mastectomy with sentinal LN biopsy L axilla but now planning just L mastectomy [name])  . Chronic kidney disease, stage 3 ([place])   . Colon polyps    benign  . Complication of anesthesia    hard to wake up  . Coronary artery disease    30% stenosis reported on previous cardiac catheterization in 2006. This was done in [facility]   . GERD (gastroesophageal reflux disease)   . Headaches, cluster   . Hyperlipidemia   . Hypertension   . Hypothyroidism   . Osteopenia 07/2014   DEXA 0.5 spine, 01.7 R femoral neck  . Pneumonia    years ago  . Secondary hyperparathyroidism of renal origin ([place])    likely  . Sensorineural hearing loss (SNHL) of both ears 01/27/2016   Audiology eval - moderately severe sensorineural hearing loss bilaterally rec amplification (07/2016 - [facility] [name] audiologist)  . Sleep apnea    on CPAP  . Thyroid disease   . Type 2 diabetes, uncontrolled, with neuropathy ([place])    followed by endo Dr Buddy Duty   Past Surgical History:  Procedure Laterality Date  . ABDOMINAL HYSTERECTOMY     heavy bleeding, ovaries remain  . APPENDECTOMY    . BREAST BIOPSY Right    benign  . BREAST BIOPSY Left 02/2014   ?DCIS  . CARDIAC CATHETERIZATION  2006   [place], [place].   . CARDIAC CATHETERIZATION  10/2013   mild 2v nonobstructive disease, EF 60%  . CARDIOVASCULAR STRESS TEST  09/2012   WNL ([place])  . CHOLECYSTECTOMY    . COLONOSCOPY  05/2010   2 TAs, diverticulosis, rpt 2 yrs (Dr Elissa Hefty in [place])  . COLONOSCOPY  12/2014   1 TA, rpt 5 yrs Henrene Pastor)  . cortisone right knee  01/04/15  . GALLBLADDER SURGERY    . MASTECTOMY W/ SENTINEL  NODE BIOPSY Left 06/07/2014   DR WAKEFIELD  . SIMPLE MASTECTOMY WITH AXILLARY SENTINEL NODE BIOPSY Left 06/07/2014   Procedure: LEFT TOTAL MASTECTOMY WITH LEFT AXILLARY SENTINEL NODE BIOPSY;  Surgeon: Rolm Bookbinder, MD;  Location: [place];  Service: General;  Laterality: Left;     A IV Location/Drains/Wounds Patient Lines/Drains/Airways Status   Active Line/Drains/Airways    Name:   Placement date:   Placement time:   Site:   Days:   Peripheral IV 03/07/19 Right Forearm   03/07/19    1044    Forearm   less than 1   Closed System Drain Left Chest Bulb (JP) 19 Fr.   06/07/14    1239    Chest   1734   Incision (Closed) 06/07/14 Breast Left   06/07/14    1254      1734          Intake/Output Last 24 hours No intake or output data in the 24 hours ending 03/07/19 1457  Labs/Imaging Results for orders placed or performed during the hospital encounter of 03/07/19 (from the past 48 hour(s))  CBG monitoring, ED     Status: Abnormal   Collection Time: 03/07/19  9:58 AM  Result Value Ref Range   Glucose-Capillary 119 (H) 70 - 99 mg/dL   Comment 1 Notify RN    Comment 2 Document in Chart   Protime-INR     Status: None   Collection Time: 03/07/19  9:59 AM  Result Value Ref Range   Prothrombin Time 13.1 11.4 - 15.2 seconds   INR 1.0 0.8 - 1.2    Comment: (NOTE) INR goal varies based on device and disease states. Performed at [facility], [place] [address], [place], [place] [address]   APTT     Status: Abnormal   Collection Time: 03/07/19  9:59 AM  Result Value Ref Range   aPTT 41 (H) 24 - 36 seconds    Comment:        IF BASELINE aPTT IS ELEVATED, SUGGEST PATIENT RISK ASSESSMENT BE USED TO DETERMINE APPROPRIATE ANTICOAGULANT THERAPY. Performed at [facility], [place] [address] [place], [place] [address]   CBC     Status: Abnormal   Collection Time: 03/07/19  9:59 AM  Result Value Ref Range   WBC 8.2 4.0 - 10.5 K/uL   RBC 4.79 3.87 - 5.11 MIL/uL   Hemoglobin 14.1 12.0 - 15.0 g/dL   HCT 42.1 36.0 - 46.0 %   MCV 87.9 80.0 - 100.0 fL   MCH 29.4 26.0 - 34.0 pg   MCHC 33.5 30.0 - 36.0 g/dL   RDW 13.2 11.5 - 15.5 %   Platelets 148 (L) 150 - 400 K/uL   nRBC 0.0 0.0 - 0.2 %    Comment: Performed at [facility], [place] [address] [place], [place] [address]  Differential     Status: None   Collection Time: 03/07/19  9:59 AM  Result Value Ref Range   Neutrophils Relative % 61 %   Neutro Abs 5.0 1.7 - 7.7 K/uL   Lymphocytes Relative 27 %   Lymphs Abs 2.3 0.7 - 4.0 K/uL   Monocytes Relative 6 %   Monocytes Absolute 0.5 0.1 - 1.0 K/uL   Eosinophils Relative 5 %   Eosinophils Absolute 0.4 0.0 - 0.5 K/uL   Basophils Relative 1  %   Basophils Absolute 0.0 0.0 - 0.1 K/uL   Immature Granulocytes 0 %   Abs Immature  Granulocytes 0.03 0.00 - 0.07 K/uL    Comment: Performed at [facility], [place] [address] [place], [place] [address]  Comprehensive metabolic panel     Status: Abnormal   Collection Time: 03/07/19  9:59 AM  Result Value Ref Range   Sodium 141 135 - 145 mmol/L   Potassium 4.3 3.5 - 5.1 mmol/L   Chloride 108 98 - 111 mmol/L   CO2 24 22 - 32 mmol/L   Glucose, Bld 125 (H) 70 - 99 mg/dL   BUN 16 8 - 23 mg/dL   Creatinine, Ser 1.53 (H) 0.44 - 1.00 mg/dL   Calcium 9.3 8.9 - 10.3 mg/dL   Total Protein 7.0 6.5 - 8.1 g/dL   Albumin 4.1 3.5 - 5.0 g/dL   AST 19 15 - 41 U/L   ALT 15 0 - 44 U/L   Alkaline Phosphatase 92 38 - 126 U/L   Total Bilirubin 1.0 0.3 - 1.2 mg/dL   GFR calc non Af Amer 33 (L) >60 mL/min   GFR calc Af Amer 38 (L) >60 mL/min   Anion gap 9 5 - 15    Comment: Performed at [facility] [address], [place], [place] [address]  I-stat chem 8, ED     Status: Abnormal   Collection Time: 03/07/19 10:03 AM  Result Value Ref Range   Sodium 140 135 - 145 mmol/L   Potassium 4.2 3.5 - 5.1 mmol/L   Chloride 106 98 - 111 mmol/L   BUN 18 8 - 23 mg/dL   Creatinine, Ser 1.40 (H) 0.44 - 1.00 mg/dL   Glucose, Bld 120 (H) 70 - 99 mg/dL   Calcium, Ion 1.14 (L) 1.15 - 1.40 mmol/L   TCO2 22 22 - 32 mmol/L   Hemoglobin 13.9 12.0 - 15.0 g/dL   HCT 41.0 36.0 - 46.0 %   Ct Angio Head W Or Wo Contrast  Result Date: 03/07/2019 CLINICAL DATA:  Left-sided weakness EXAM: CT ANGIOGRAPHY HEAD AND NECK TECHNIQUE: Multidetector CT imaging of the head and neck was performed using the standard protocol during bolus administration of intravenous contrast. Multiplanar CT image reconstructions and MIPs were obtained to evaluate the vascular anatomy. Carotid stenosis measurements (when applicable) are obtained utilizing NASCET criteria, using the distal internal carotid diameter as the denominator. CONTRAST:   180mL OMNIPAQUE IOHEXOL 350 MG/ML SOLN COMPARISON:  None. FINDINGS: CTA NECK FINDINGS Aortic arch: Mild atherosclerotic plaque.  Three vessel branching. Right carotid system: Mixed density plaque at the ICA bulb/bifurcation with 25% ICA origin narrowing. Negative for ulceration or beading. Left carotid system: Mild atherosclerotic plaque at the bifurcation. No stenosis or ulceration. Vertebral arteries: Proximal subclavian atherosclerosis without flow limiting stenosis. Strong right vertebral artery dominance. No acquired vertebral stenosis. Skeleton: Spondylosis with bridging cervical and upper thoracic osteophytes. Other neck: Negative Upper chest: Negative Review of the MIP images confirms the above findings CTA HEAD FINDINGS Anterior circulation: Atherosclerotic calcification on the carotid siphons. No branch occlusion or proximal flow limiting stenosis. Negative for aneurysm. Posterior circulation: Strong right vertebral artery dominance. Most left vertebral flow is to the PICA. Basilar is diffusely patent and small in the setting of bilateral fetal type PCA. No branch occlusion. Negative for aneurysm Venous sinuses: Diffusely patent Anatomic variants: As above Review of the MIP images confirms the above findings IMPRESSION: 1. No emergent finding. 2. Atherosclerosis without flow limiting stenosis in the head and neck. Electronically Signed   By: Monte Fantasia M.D.   On: 03/07/2019  10:54   Ct Angio Neck W Or Wo Contrast  Result Date: 03/07/2019 CLINICAL DATA:  Left-sided weakness EXAM: CT ANGIOGRAPHY HEAD AND NECK TECHNIQUE: Multidetector CT imaging of the head and neck was performed using the standard protocol during bolus administration of intravenous contrast. Multiplanar CT image reconstructions and MIPs were obtained to evaluate the vascular anatomy. Carotid stenosis measurements (when applicable) are obtained utilizing NASCET criteria, using the distal internal carotid diameter as the denominator.  CONTRAST:  178mL OMNIPAQUE IOHEXOL 350 MG/ML SOLN COMPARISON:  None. FINDINGS: CTA NECK FINDINGS Aortic arch: Mild atherosclerotic plaque.  Three vessel branching. Right carotid system: Mixed density plaque at the ICA bulb/bifurcation with 25% ICA origin narrowing. Negative for ulceration or beading. Left carotid system: Mild atherosclerotic plaque at the bifurcation. No stenosis or ulceration. Vertebral arteries: Proximal subclavian atherosclerosis without flow limiting stenosis. Strong right vertebral artery dominance. No acquired vertebral stenosis. Skeleton: Spondylosis with bridging cervical and upper thoracic osteophytes. Other neck: Negative Upper chest: Negative Review of the MIP images confirms the above findings CTA HEAD FINDINGS Anterior circulation: Atherosclerotic calcification on the carotid siphons. No branch occlusion or proximal flow limiting stenosis. Negative for aneurysm. Posterior circulation: Strong right vertebral artery dominance. Most left vertebral flow is to the PICA. Basilar is diffusely patent and small in the setting of bilateral fetal type PCA. No branch occlusion. Negative for aneurysm Venous sinuses: Diffusely patent Anatomic variants: As above Review of the MIP images confirms the above findings IMPRESSION: 1. No emergent finding. 2. Atherosclerosis without flow limiting stenosis in the head and neck. Electronically Signed   By: Monte Fantasia M.D.   On: 03/07/2019 10:54   Mr Brain Wo Contrast  Result Date: 03/07/2019 CLINICAL DATA:  Left-sided weakness this morning EXAM: MRI HEAD WITHOUT CONTRAST TECHNIQUE: Multiplanar, multiecho pulse sequences of the brain and surrounding structures were obtained without intravenous contrast. COMPARISON:  Head CT and CTA from earlier today FINDINGS: Brain: Acute perforator infarct on the right affecting corona radiata, posterior caudate, and upper putamen. No pre-existing infarct. No hemorrhage, hydrocephalus, collection, or masslike finding  Vascular: No change from prior CTA. Skull and upper cervical spine: Negative for marrow lesion. Sinuses/Orbits: Negative IMPRESSION: Acute perforator infarct at the right basal ganglia and corona radiata. Electronically Signed   By: Monte Fantasia M.D.   On: 03/07/2019 14:31   Ct Head Code Stroke Wo Contrast  Result Date: 03/07/2019 CLINICAL DATA:  Code stroke.  Left-sided weakness EXAM: CT HEAD WITHOUT CONTRAST TECHNIQUE: Contiguous axial images were obtained from the base of the skull through the vertex without intravenous contrast. COMPARISON:  None. FINDINGS: Brain: No evidence of acute infarction, hemorrhage, hydrocephalus, extra-axial collection or mass lesion/mass effect. Vascular: No hyperdense vessel.  Atherosclerotic calcification. Skull: Normal. Negative for fracture or focal lesion. Sinuses/Orbits: Essentially clear sinuses. Bone island or other benign process posterior to the right sphenoid sinus. Other: These results were communicated to Dr. Rory Percy at 10:11 amon 9/7/2020by text page via the [facility] messaging system. ASPECTS [facility] Stroke Program Early CT Score) - Ganglionic level infarction (caudate, lentiform nuclei, internal capsule, insula, M1-M3 cortex): 7 - Supraganglionic infarction (M4-M6 cortex): 3 Total score (0-10 with 10 being normal): 10 IMPRESSION: 1. No acute finding. 2. ASPECTS is 10. Electronically Signed   By: Monte Fantasia M.D.   On: 03/07/2019 10:12    Pending Labs Unresulted Labs (From admission, onward)    Start     Ordered   03/14/19 0500  Creatinine, serum  (enoxaparin (LOVENOX)    CrCl >/=  30 ml/min)  Weekly,   R    Comments: while on enoxaparin therapy    03/07/19 1239   03/08/19 1242  TSH  Once,   R     03/07/19 1242   03/08/19 0500  Hemoglobin A1c  Tomorrow morning,   R     03/07/19 1239   03/08/19 0500  Lipid panel  Tomorrow morning,   R    Comments: Fasting    03/07/19 1239   03/07/19 1229  CBC  (enoxaparin (LOVENOX)    CrCl >/= 30 ml/min)  Once,    STAT    Comments: Baseline for enoxaparin therapy IF NOT ALREADY DRAWN.  Notify MD if PLT < 100 K.    03/07/19 1239   03/07/19 1229  Creatinine, serum  (enoxaparin (LOVENOX)    CrCl >/= 30 ml/min)  Once,   STAT    Comments: Baseline for enoxaparin therapy IF NOT ALREADY DRAWN.    03/07/19 1239          Vitals/Pain Today's Vitals   03/07/19 1229 03/07/19 1300 03/07/19 1330 03/07/19 1445  BP:  (!) 196/82 (!) 166/146 (!) 166/78  Pulse:  67  63  Resp:  18 17 16   Temp:      TempSrc:      SpO2: 98% 95%  97%  PainSc:    0-No pain    Isolation Precautions No active isolations  Medications Medications  sodium chloride flush (NS) 0.9 % injection 3 mL (has no administration in time range)   stroke: mapping our early stages of recovery book (has no administration in time range)  acetaminophen (TYLENOL) tablet 650 mg (has no administration in time range)    Or  acetaminophen (TYLENOL) solution 650 mg (has no administration in time range)    Or  acetaminophen (TYLENOL) suppository 650 mg (has no administration in time range)  enoxaparin (LOVENOX) injection 40 mg (has no administration in time range)  insulin aspart (novoLOG) injection 0-15 Units (has no administration in time range)  insulin aspart (novoLOG) injection 0-5 Units (has no administration in time range)  aspirin tablet 325 mg (325 mg Oral Given 03/07/19 1310)  atorvastatin (LIPITOR) tablet 80 mg (has no administration in time range)  mometasone-formoterol (DULERA) 200-5 MCG/ACT inhaler 2 puff (2 puffs Inhalation Not Given 03/07/19 1349)  albuterol (PROVENTIL) (2.5 MG/3ML) 0.083% nebulizer solution 2.5 mg (has no administration in time range)  iohexol (OMNIPAQUE) 350 MG/ML injection 100 mL (100 mLs Intravenous Contrast Given 03/07/19 1026)  LORazepam (ATIVAN) injection 0.5 mg (0.5 mg Intravenous Given 03/07/19 1341)    Mobility non-ambulatory (left sided weakness) Moderate fall risk   Focused Assessments Neuro Assessment  Handoff:  Swallow screen pass? Yes    NIH Stroke Scale ( + Modified Stroke Scale Criteria)  Interval: Initial Level of Consciousness (1a.)   : Alert, keenly responsive LOC Questions (1b. )   +: Answers both questions correctly LOC Commands (1c. )   + : Performs both tasks correctly Best Gaze (2. )  +: Normal Visual (3. )  +: No visual loss Facial Palsy (4. )    : Normal symmetrical movements Motor Arm, Left (5a. )   +: Some effort against gravity Motor Arm, Right (5b. )   +: No drift Motor Leg, Left (6a. )   +: Some effort against gravity Motor Leg, Right (6b. )   +: No drift Limb Ataxia (7. ): Present in one limb Sensory (8. )   +: Normal, no sensory loss Best  Language (9. )   +: No aphasia Dysarthria (10. ): Normal Extinction/Inattention (11.)   +: No Abnormality Modified SS Total  +: 4 Complete NIHSS TOTAL: 6 Last date known well: 03/07/19 Last time known well: 0430 Neuro Assessment: Exceptions to [facility] Neuro Checks:   Initial (03/07/19 1047)  Last Documented NIHSS Modified Score: 4 (03/07/19 1439) Has TPA been given? No If patient is a Neuro Trauma and patient is going to OR before floor call report to [place] nurse: [phone] or [phone]     R Recommendations: See Admitting Provider Note  Report given to: Levada Dy RN  Additional Notes:  MRI-waiting on decision for IR

## 2019-03-07 NOTE — Consult Note (Addendum)
Neurology Consultation  Reason for Consult: Code stroke Referring Physician: Tegeler  History is obtained from: EMS  HPI: Julia Banks is a 78 y.o. female diabetes, thyroid disease, sensorineural hearing loss, hypothyroidism, hypertension, hyperlipidemia, cluster headaches, CAD, CKD stage III.  Apparently went to sleep last night and was at normal baseline.  Patient got up at 430 and again felt like she was at normal baseline.  At 8:00 she noted she was unable to move her left arm and left leg.  Family was available when EMS came family members noticed that she had some dysarthria.  On arrival to the bridge patient did not know to have any dysarthria.  Still had left arm and left leg weakness and decreased sensation.  Patient was immediately brought to CT where she obtained a CT head/CTA head neck.   LKW: 0430 tpa given?: no, out of window Premorbid modified Rankin scale (mRS): 0 NIH stroke score -6   ROS: ROS was performed and is negative except as noted in the HPI.   Past Medical History:  Diagnosis Date  . Arthritis   . Asthma   . Breast cancer, left breast ([place]) 02/2014   DCIS, ER+ PR- initially rec bilat mastectomy with sentinal LN biopsy L axilla but now planning just L mastectomy [name])  . Chronic kidney disease, stage 3 ([place])   . Colon polyps    benign  . Complication of anesthesia    hard to wake up  . Coronary artery disease    30% stenosis reported on previous cardiac catheterization in 2006. This was done in [facility]  . GERD (gastroesophageal reflux disease)   . Headaches, cluster   . Hyperlipidemia   . Hypertension   . Hypothyroidism   . Osteopenia 07/2014   DEXA 0.5 spine, 01.7 R femoral neck  . Pneumonia    years ago  . Secondary hyperparathyroidism of renal origin ([place])    likely  . Sensorineural hearing loss (SNHL) of both ears 01/27/2016   Audiology eval - moderately severe sensorineural hearing loss bilaterally rec amplification  (07/2016 - [name] audiologist)  . Sleep apnea    on CPAP  . Thyroid disease   . Type 2 diabetes, uncontrolled, with neuropathy ([place])    followed by endo Dr Buddy Duty     Family History  Problem Relation Age of Onset  . Arthritis Mother   . Hyperlipidemia Mother   . Stroke Mother   . Hypertension Mother   . Diabetes Mother   . Other Mother        parathyroid adenoma  . Arthritis Father   . Hyperlipidemia Father   . CAD Father        MI  . Hypertension Father   . Breast cancer Other   . Ovarian cancer Other   . Leukemia Brother        dx in his 68s  . Colon cancer Maternal Uncle        dx in his 47s  . Ovarian cancer Maternal Grandmother 90  . Melanoma Other 27       dx 2x - 28 and 29/dx 3x - ages 44, 22, and 33  . Cancer Neg Hx   . Crohn's disease Neg Hx   . Colon polyps Neg Hx   . Ulcerative colitis Neg Hx   . Stomach cancer Neg Hx   . Rectal cancer Neg Hx    Social History:   reports that she has never smoked.  She has never used smokeless tobacco. She reports that she does not drink alcohol or use drugs.  Medications  Current Facility-Administered Medications:  .  sodium chloride flush (NS) 0.9 % injection 3 mL, 3 mL, Intravenous, Once, Tegeler, Gwenyth Allegra, MD  Current Outpatient Medications:  .  albuterol (PROVENTIL HFA;VENTOLIN HFA) 108 (90 BASE) MCG/ACT inhaler, Inhale into the lungs every 6 (six) hours as needed for wheezing or shortness of breath., Disp: , Rfl:  .  Ascorbic Acid (VITAMIN C WITH ROSE HIPS) 500 MG tablet, Take 500 mg by mouth daily., Disp: , Rfl:  .  BD PEN NEEDLE NANO U/F 32G X 4 MM MISC, USE 1 AS DIRECTED TO INJECT MEDICATION DAILY. DX CODE: E11.40, E11.65, Disp: 100 each, Rfl: 1 .  Cholecalciferol (VITAMIN D PO), Take 5,000 Units by mouth., Disp: , Rfl:  .  diphenhydrAMINE (BENADRYL) 12.5 MG chewable tablet, Chew 12.5 mg by mouth at bedtime as needed for sleep., Disp: , Rfl:  .  docusate sodium (COLACE) 100 MG capsule,  Take 100 mg by mouth 2 (two) times daily., Disp: , Rfl:  .  Fluticasone-Salmeterol (ADVAIR) 250-50 MCG/DOSE AEPB, Inhale 1 puff into the lungs 2 (two) times daily., Disp: , Rfl:  .  furosemide (LASIX) 20 MG tablet, Take 1 tablet (20 mg total) by mouth every other day., Disp: 45 tablet, Rfl: 3 .  Insulin Glargine (LANTUS SOLOSTAR) 100 UNIT/ML Solostar Pen, INJECT 56 UNITS INTO THE SKIN DAILY WITH BREAKFAST, Disp: 51 mL, Rfl: 0 .  levothyroxine (SYNTHROID, LEVOTHROID) 25 MCG tablet, TAKE 1 TABLET BY MOUTH DAILY. EXCEPT 2 TABLETS ON MON, WED, AND FRI, Disp: 130 tablet, Rfl: 3 .  losartan (COZAAR) 50 MG tablet, Take 1 tablet (50 mg total) by mouth daily., Disp: 90 tablet, Rfl: 1 .  metoprolol succinate (TOPROL-XL) 50 MG 24 hr tablet, Take 1 tablet (50 mg total) by mouth daily., Disp: 90 tablet, Rfl: 3 .  Omega-3 Fatty Acids (OMEGA 3 PO), Take by mouth. 180 EPA/120DHA, Disp: , Rfl:  .  OVER THE COUNTER MEDICATION, Calcium magnesium 1000mg  day, Disp: , Rfl:  .  OVER THE COUNTER MEDICATION, Women's multivitamin with iron, Disp: , Rfl:  .  pantoprazole (PROTONIX) 40 MG tablet, TAKE 1 TABLET BY MOUTH EVERY DAY, Disp: 90 tablet, Rfl: 0 .  potassium chloride SA (KLOR-CON M20) 20 MEQ tablet, Take 1 tablet (20 mEq total) by mouth daily., Disp: 90 tablet, Rfl: 3 .  pravastatin (PRAVACHOL) 40 MG tablet, Take 1 tablet (40 mg total) by mouth daily., Disp: 90 tablet, Rfl: 3 .  vitamin B-12 (CYANOCOBALAMIN) 1000 MCG tablet, Take 1,000 mcg by mouth daily., Disp: , Rfl:    Exam: Current vital signs: BP (!) 203/70   Pulse 67   Temp 98.2 F (36.8 C) (Oral)   Resp 19   SpO2 97%  Vital signs in last 24 hours: Temp:  [98.2 F (36.8 C)] 98.2 F (36.8 C) (09/07 1046) Pulse Rate:  [67-70] 67 (09/07 1100) Resp:  [13-19] 19 (09/07 1100) BP: (202-203)/(67-70) 203/70 (09/07 1100) SpO2:  [96 %-97 %] 97 % (09/07 1100)  Physical Exam  Constitutional: Appears well-developed and well-nourished.  Psych: Affect  appropriate to situation Eyes: No scleral injection HENT: No OP obstrucion Head: Normocephalic.  Cardiovascular: Normal rate and regular rhythm.  Respiratory: Effort normal, non-labored breathing GI: Soft.  No distension. There is no tenderness.  Skin: WDI  Neuro: Mental Status: Patient is awake, alert, oriented to person, place, month, year, and situation. Patient  is able to give a clear and coherent history. No signs of aphasia or neglect Cranial Nerves: II: Visual Fields are full.  III,IV, VI: EOMI without ptosis or diploplia. Pupils equal, round and reactive to light V: Facial sensation decreased on the left VII: Facial movement is symmetric.  VIII: hearing is intact to voice X: Palat elevates symmetrically XI: Shoulder shrug is symmetric. XII: tongue is midline without atrophy or fasciculations.  Motor: Tone is normal. Bulk is normal. 5/5 strength on the right.  2/5 on the left arm and left leg. Sensory: Sensation decreased on the left arm and leg Deep Tendon Reflexes: No appreciated deep tendon reflexes Plantars: Mute bilaterally Cerebellar: Right finger-nose showed no dysmetria and right heel-to-shin showed no dysmetria unable to do on the left  Labs I have reviewed labs in epic and the results pertinent to this consultation are:   CBC    Component Value Date/Time   WBC 8.6 01/26/2017 1116   RBC 4.51 01/26/2017 1116   HGB 13.4 01/26/2017 1116   HGB 13.0 04/05/2014 0820   HCT 39.6 01/26/2017 1116   HCT 39.0 04/05/2014 0820   PLT 178.0 01/26/2017 1116   PLT 176 04/05/2014 0820   MCV 87.8 01/26/2017 1116   MCV 86.7 04/05/2014 0820   MCH 28.6 05/31/2014 1059   MCHC 33.9 01/26/2017 1116   RDW 14.1 01/26/2017 1116   RDW 14.1 04/05/2014 0820   LYMPHSABS 2.1 01/26/2017 1116   LYMPHSABS 2.2 04/05/2014 0820   MONOABS 0.6 01/26/2017 1116   MONOABS 0.5 04/05/2014 0820   EOSABS 0.4 01/26/2017 1116   EOSABS 0.3 04/05/2014 0820   BASOSABS 0.0 01/26/2017 1116    BASOSABS 0.1 04/05/2014 0820    CMP     Component Value Date/Time   NA 138 11/01/2018 1123   NA 140 04/02/2015 1447   K 4.8 11/01/2018 1123   K 4.3 04/02/2015 1447   CL 105 11/01/2018 1123   CO2 26 11/01/2018 1123   CO2 27 04/02/2015 1447   GLUCOSE 164 (H) 11/01/2018 1123   GLUCOSE 96 04/02/2015 1447   BUN 14 11/01/2018 1123   BUN 15 08/05/2016   BUN 15.3 04/02/2015 1447   CREATININE 1.24 (H) 11/01/2018 1123   CREATININE 1.3 (H) 04/02/2015 1447   CALCIUM 9.4 11/01/2018 1123   CALCIUM 9.5 04/02/2015 1447   PROT 7.3 04/06/2018 0808   PROT 6.6 04/02/2015 1447   ALBUMIN 4.2 11/01/2018 1123   ALBUMIN 4.0 04/02/2015 1447   AST 17 04/06/2018 0808   AST 20 04/02/2015 1447   ALT 14 04/06/2018 0808   ALT 17 04/02/2015 1447   ALKPHOS 90 04/06/2018 0808   ALKPHOS 85 04/02/2015 1447   BILITOT 0.7 04/06/2018 0808   BILITOT 0.67 04/02/2015 1447   GFRNONAA 50 (L) 06/08/2014 0401   GFRAA 58 (L) 06/08/2014 0401    Lipid Panel     Component Value Date/Time   CHOL 208 (H) 11/01/2018 1123   TRIG 151.0 (H) 11/01/2018 1123   HDL 38.80 (L) 11/01/2018 1123   CHOLHDL 5 11/01/2018 1123   VLDL 30.2 11/01/2018 1123   LDLCALC 139 (H) 11/01/2018 1123   LDLDIRECT 112.0 03/30/2017 1018     Imaging I have reviewed the images obtained:  CT-scan of the brain-no acute findings   Etta Quill PA-C [facility] [phone]  M-F  (9:00 am- 5:00 PM)  03/07/2019, 10:06 AM   Attending addendum I seen and examined the patient has an acute code stroke for left-sided weakness and  numbness. NIH-6 for left-sided weakness and sensory loss in the upper and lower extremity. No facial involvement. Suspect lacunar thalamic capsular stroke versus brainstem infarct. I have personally reviewed imaging-CT head with no acute changes.  CTA head and neck with no LVO.  Assessment:  78 year old female presenting with left-sided weakness and decreased sensation.  Symptoms consistent with a lacunar  infarct No clinical evidence of LVO.  No LVO evident on CTA head and neck as well. Not a candidate for TPA due to being outside the window for IV TPA.  Recommend # MRI of the brain without contrast #Transthoracic Echo, may need TEE and loop monitor  # Start patient on ASA 325mg  daily,   #Start or continue Atorvastatin 80 mg/other high intensity statin # BP goal: permissive HTN upto 220/120 mmHg # HBAIC and Lipid profile # Telemetry monitoring # Frequent neuro checks # NPO until passes stroke swallow screen # please page stroke NP  Or  PA  Or MD from 8am -4 pm  as this patient from this time will be  followed by the stroke.   You can look them up on [email or web address]  Password [email or web address]  -- Amie Portland, MD [facility] Pager: [phone] If 7pm to 7am, please call on call as listed on AMION.

## 2019-03-07 NOTE — ED Notes (Signed)
Patient transported to MRI 

## 2019-03-07 NOTE — Plan of Care (Signed)
  Problem: Clinical Measurements: Goal: Ability to maintain clinical measurements within normal limits will improve Outcome: Progressing Goal: Will remain free from infection Outcome: Progressing Goal: Diagnostic test results will improve Outcome: Progressing Goal: Respiratory complications will improve Outcome: Progressing Goal: Cardiovascular complication will be avoided Outcome: Progressing   Problem: Clinical Measurements: Goal: Ability to maintain clinical measurements within normal limits will improve Outcome: Progressing Goal: Will remain free from infection Outcome: Progressing Goal: Diagnostic test results will improve Outcome: Progressing Goal: Respiratory complications will improve Outcome: Progressing Goal: Cardiovascular complication will be avoided Outcome: Progressing   Problem: Activity: Goal: Risk for activity intolerance will decrease Outcome: Progressing   Problem: Activity: Goal: Risk for activity intolerance will decrease Outcome: Progressing   Problem: Pain Managment: Goal: General experience of comfort will improve Outcome: Progressing   Problem: Pain Managment: Goal: General experience of comfort will improve Outcome: Progressing   Problem: Safety: Goal: Ability to remain free from injury will improve Outcome: Progressing   Problem: Safety: Goal: Ability to remain free from injury will improve Outcome: Progressing   Problem: Education: Goal: Knowledge of disease or condition will improve Outcome: Progressing Goal: Knowledge of secondary prevention will improve Outcome: Progressing Goal: Knowledge of patient specific risk factors addressed and post discharge goals established will improve Outcome: Progressing   Problem: Ischemic Stroke/TIA Tissue Perfusion: Goal: Complications of ischemic stroke/TIA will be minimized Outcome: Progressing

## 2019-03-08 ENCOUNTER — Inpatient Hospital Stay (HOSPITAL_COMMUNITY): Payer: Medicare Other

## 2019-03-08 DIAGNOSIS — E039 Hypothyroidism, unspecified: Secondary | ICD-10-CM

## 2019-03-08 DIAGNOSIS — E785 Hyperlipidemia, unspecified: Secondary | ICD-10-CM

## 2019-03-08 DIAGNOSIS — E1165 Type 2 diabetes mellitus with hyperglycemia: Secondary | ICD-10-CM

## 2019-03-08 DIAGNOSIS — I63032 Cerebral infarction due to thrombosis of left carotid artery: Secondary | ICD-10-CM

## 2019-03-08 DIAGNOSIS — I639 Cerebral infarction, unspecified: Secondary | ICD-10-CM

## 2019-03-08 DIAGNOSIS — I6389 Other cerebral infarction: Secondary | ICD-10-CM

## 2019-03-08 DIAGNOSIS — E1140 Type 2 diabetes mellitus with diabetic neuropathy, unspecified: Secondary | ICD-10-CM

## 2019-03-08 LAB — GLUCOSE, CAPILLARY
Glucose-Capillary: 193 mg/dL — ABNORMAL HIGH (ref 70–99)
Glucose-Capillary: 288 mg/dL — ABNORMAL HIGH (ref 70–99)
Glucose-Capillary: 325 mg/dL — ABNORMAL HIGH (ref 70–99)

## 2019-03-08 LAB — LIPID PANEL
Cholesterol: 207 mg/dL — ABNORMAL HIGH (ref 0–200)
HDL: 31 mg/dL — ABNORMAL LOW (ref >40)
LDL Cholesterol: 144 mg/dL — ABNORMAL HIGH (ref 0–99)
Total CHOL/HDL Ratio: 6.7 RATIO
Triglycerides: 160 mg/dL — ABNORMAL HIGH (ref <150)
VLDL: 32 mg/dL (ref 0–40)

## 2019-03-08 LAB — TSH: TSH: 3.531 u[IU]/mL (ref 0.350–4.500)

## 2019-03-08 LAB — ECHOCARDIOGRAM COMPLETE

## 2019-03-08 LAB — SARS CORONAVIRUS 2 (TAT 6-24 HRS): SARS Coronavirus 2: NEGATIVE

## 2019-03-08 MED ORDER — PANTOPRAZOLE SODIUM 40 MG PO TBEC
40.0000 mg | DELAYED_RELEASE_TABLET | Freq: Every day | ORAL | Status: DC
  Administered 2019-03-08 – 2019-03-09 (×2): 40 mg via ORAL
  Filled 2019-03-08 (×3): qty 1

## 2019-03-08 MED ORDER — INSULIN GLARGINE 100 UNIT/ML ~~LOC~~ SOLN
40.0000 [IU] | Freq: Every day | SUBCUTANEOUS | Status: DC
  Administered 2019-03-08: 40 [IU] via SUBCUTANEOUS
  Filled 2019-03-08 (×2): qty 0.4

## 2019-03-08 MED ORDER — LEVOTHYROXINE SODIUM 25 MCG PO TABS: 25.0000 ug | ORAL_TABLET | ORAL | Status: DC

## 2019-03-08 MED ORDER — LEVOTHYROXINE SODIUM 50 MCG PO TABS
50.0000 ug | ORAL_TABLET | ORAL | Status: DC
  Administered 2019-03-09: 50 ug via ORAL
  Filled 2019-03-08: qty 1

## 2019-03-08 NOTE — Evaluation (Signed)
Speech Language Pathology Evaluation Patient Details Name: Julia Banks MRN: [id number] DOB: [birth year] Today's Date: 03/08/2019 Time: 1035-1100 SLP Time Calculation (min) (ACUTE ONLY): 25 min  Problem List:  Patient Active Problem List   Diagnosis Date Noted  . Ischemic stroke ([place]) 03/07/2019  . CKD (chronic kidney disease), stage III ([place]) 03/07/2019  . Advanced care planning/counseling discussion 01/27/2016  . Sensorineural hearing loss (SNHL) of both ears 01/27/2016  . Chest discomfort 01/27/2016  . Insomnia 02/13/2015  . Chronic cough 10/10/2014  . Osteopenia 07/31/2014  . S/P mastectomy 06/07/2014  . Genetic testing 06/02/2014  . Breast cancer of upper-outer quadrant of left female breast ([place]) 03/28/2014  . Grieving 10/25/2013  . Secondary hyperparathyroidism of renal origin ([place])   . Medicare annual wellness visit, subsequent 06/28/2013  . Diabetes mellitus with stage 3 chronic kidney disease ([place])   . Hypothyroidism 04/13/2013  . GERD (gastroesophageal reflux disease)   . Essential hypertension, benign 09/17/2012  . HLD (hyperlipidemia) 09/17/2012  . Type 2 diabetes, uncontrolled, with neuropathy ([place]) 02/15/2012  . OSA on CPAP 02/13/2012  . Coronary artery disease 02/13/2012   Past Medical History:  Past Medical History:  Diagnosis Date  . Arthritis   . Asthma   . Breast cancer, left breast ([place]) 02/2014   DCIS, ER+ PR- initially rec bilat mastectomy with sentinal LN biopsy L axilla but now planning just L mastectomy [name])  . Chronic kidney disease, stage 3 ([place])   . Colon polyps    benign  . Complication of anesthesia    hard to wake up  . Coronary artery disease    30% stenosis reported on previous cardiac catheterization in 2006. This was done in [facility]  . GERD (gastroesophageal reflux disease)   . Headaches, cluster   . Hyperlipidemia   . Hypertension   . Hypothyroidism   . Osteopenia 07/2014   DEXA 0.5 spine, 01.7 R femoral  neck  . Pneumonia    years ago  . Secondary hyperparathyroidism of renal origin ([place])    likely  . Sensorineural hearing loss (SNHL) of both ears 01/27/2016   Audiology eval - moderately severe sensorineural hearing loss bilaterally rec amplification (07/2016 - [name] audiologist)  . Sleep apnea    on CPAP  . Thyroid disease   . Type 2 diabetes, uncontrolled, with neuropathy ([place])    followed by endo Dr Buddy Duty   Past Surgical History:  Past Surgical History:  Procedure Laterality Date  . ABDOMINAL HYSTERECTOMY     heavy bleeding, ovaries remain  . APPENDECTOMY    . BREAST BIOPSY Right    benign  . BREAST BIOPSY Left 02/2014   ?DCIS  . CARDIAC CATHETERIZATION  2006   [place], [place].   . CARDIAC CATHETERIZATION  10/2013   mild 2v nonobstructive disease, EF 60%  . CARDIOVASCULAR STRESS TEST  09/2012   WNL ([place])  . CHOLECYSTECTOMY    . COLONOSCOPY  05/2010   2 TAs, diverticulosis, rpt 2 yrs (Dr Elissa Hefty in [place])  . COLONOSCOPY  12/2014   1 TA, rpt 5 yrs Henrene Pastor)  . cortisone right knee  01/04/15  . GALLBLADDER SURGERY    . MASTECTOMY W/ SENTINEL NODE BIOPSY Left 06/07/2014   DR WAKEFIELD  . SIMPLE MASTECTOMY WITH AXILLARY SENTINEL NODE BIOPSY Left 06/07/2014   Procedure: LEFT TOTAL MASTECTOMY WITH LEFT AXILLARY SENTINEL NODE BIOPSY;  Surgeon: Rolm Bookbinder, MD;  Location: [facility];  Service: General;  Laterality: Left;   HPI:  Pt is a 78 y.o. female with medical history significant of hypertension, hyperlipidemia, diabetes, CAD, hypothyroidism, chronic kidney disease stage III, sensorineural hearing loss who presented to the emergency department with complaining of left-sided numbness and weakness. MRI of the brain revealed acute perforator infarct at the right basal ganglia and corona radiata.   Assessment / Plan / Recommendation Clinical Impression  Pt participated in speech/language/cognition evaluation with her niece present. Both parties denied the pt  having any significant baseline deficits in speech, language or cognition but indicated that the pt has always been "bad" at delayed recall tasks. Pt indicated that her dysarthria has resolved and that she has not observed any acute changes in language or cognition.   [facility] Cognitive Assessment 8.1 was completed to evaluate the pt's cognitive-linguistic skills. She achieved a score of 24/30 which is slightly below the normal limits of 26 or more out of 30. However, both parties reported that the pt's performance today was representative of her baseline and informal assessment revealed functional cognitive-linguistic skills. No speech/language deficits were demonstrated. Further skilled SLP services are not clinically indicated at this time. Pt, family, and nursing were educated regarding results and recommendations; all parties verbalized understanding as well as agreement with plan of care.    SLP Assessment  SLP Recommendation/Assessment: Patient does not need any further Speech Lanaguage Pathology Services SLP Visit Diagnosis: Dysarthria and anarthria (R47.1)    Follow Up Recommendations  None    Frequency and Duration           SLP Evaluation Cognition  Overall Cognitive Status: Within Functional Limits for tasks assessed Arousal/Alertness: Awake/alert Orientation Level: Oriented X4 Attention: Sustained;Focused Focused Attention: Appears intact(Vigilance WNL: 1/1) Sustained Attention: Appears intact(Serial 7s: refused: Serial 5s: 3/3) Memory: Impaired Memory Impairment: Storage deficit;Retrieval deficit;Decreased recall of new information(Immediate: 5/5; delayed: 3/5; with cues: 2/2) Awareness: Appears intact Problem Solving: Appears intact Executive Function: Reasoning;Sequencing;Organizing Reasoning: Appears intact(Abstraction: 2/2) Sequencing: Appears intact(Clock drawing: 3/3) Organizing: Appears intact(Backaward digit span: 1/1)       Comprehension  Auditory  Comprehension Overall Auditory Comprehension: Appears within functional limits for tasks assessed Yes/No Questions: Within Functional Limits Commands: Within Functional Limits(Trail completion: 1/1) Conversation: Complex Visual Recognition/Discrimination Discrimination: Within Function Limits Reading Comprehension Reading Status: Within funtional limits    Expression Expression Primary Mode of Expression: Verbal Verbal Expression Overall Verbal Expression: Appears within functional limits for tasks assessed Initiation: No impairment Level of Generative/Spontaneous Verbalization: Conversation Repetition: No impairment(2/2) Naming: No impairment Confrontation: (3/3) Divergent: (1/1) Pragmatics: No impairment Written Expression Dominant Hand: Right Written Expression: (Copying cube: 0/1 )   Oral / Motor  Oral Motor/Sensory Function Overall Oral Motor/Sensory Function: Within functional limits Motor Speech Overall Motor Speech: Appears within functional limits for tasks assessed Respiration: Within functional limits Phonation: Normal Resonance: Within functional limits Articulation: Within functional limitis Intelligibility: Intelligible Motor Planning: Witnin functional limits Motor Speech Errors: Not applicable   Elvera Almario I. Hardin Negus, [place], [place] Office number [phone] Pager [place] 03/08/2019, 11:10 AM

## 2019-03-08 NOTE — Progress Notes (Signed)
PROGRESS NOTE    Julia Banks   [id number]  DOB: 1940-12-01  DOA: 03/07/2019 PCP: Ria Bush, MD   Brief Narrative:  Julia Banks is a 78 year old patient with hypertension, hyperlipidemia, diabetes, coronary artery disease, chronic kidney disease stage III, CAD, partial deafness who presents from home with left-sided weakness and numbness.  The patient states that she woke up around 8:00 and played with her dogs and was fine.  She subsequently went to sleep and woke up again around 830 and now noted that her left arm and leg were weak and felt numb.  She was brought to the hospital as a code stroke. Initial CT of the head including a CTA were negative. [id number] perforator infarct at the right basal ganglia and corona radiata.  Subjective: She continues to have weakness and numbness in left arm and leg    Assessment & Plan:   Principal Problem:   Ischemic stroke - See MRI report above -LDL 144, HDL 31 -A1c is pending - Neurology had to consult on her today -She has declined a full dose aspirin as she states it "bothers her stomach" - Physical therapy is recommending: Inpatient rehab and I have consulted them - The patient states that statins cause myopathy and therefore she did not take the pravastatin that was ordered this morning-question if she is a candidate for Repatha   Active Problems:   Type 2 diabetes, uncontrolled  -Hemoglobin A1c is pending- Lantus is on hold -Continue on sliding scale insulin for now    HLD (hyperlipidemia) - see above  HTN - Toprol, Cozaar and Lasix on hold to allow for permissive HTN    Hypothyroidism - cont Synthroid    CKD (chronic kidney disease), stage III (HCC)  - Cr 1.40 today- baseline is about 1.2   Time spent in minutes: 35 DVT prophylaxis: Lovenox Code Status: Full code Family Communication: niece at bedside Disposition Plan: possible CIR Consultants:   neuro Procedures:  2 d ECHO 1. The left  ventricle has normal systolic function with an ejection fraction of 60-65%. The cavity size was normal. There is mildly increased left ventricular wall thickness. Left ventricular diastolic Doppler parameters are consistent with impaired  relaxation.  2. The aortic root is normal in size and structure.  3. The aortic valve is tricuspid. No stenosis of the aortic valve.  4. The right ventricle has normal systolic function. The cavity was normal. There is no increase in right ventricular wall thickness.  5. No evidence of mitral valve stenosis. No significant mitral regurgitation.  6. The IVC was normal in size. No complete TR doppler jet so unable to estimate PA systolic pressure. Antimicrobials:  Anti-infectives (From admission, onward)   None       Objective: Vitals:   03/08/19 0044 03/08/19 0412 03/08/19 0753 03/08/19 1226  BP: (!) 162/62 (!) 159/66  (!) 182/71  Pulse: (!) 56 66  70  Resp:    17  Temp: 98.1 F (36.7 C) 97.7 F (36.5 C)  98.2 F (36.8 C)  TempSrc: Oral Oral  Oral  SpO2: 99% 100% 99% 97%    Intake/Output Summary (Last 24 hours) at 03/08/2019 1402 Last data filed at 03/07/2019 1537 Gross per 24 hour  Intake --  Output 250 ml  Net -250 ml   There were no vitals filed for this visit.  Examination: General exam: Appears comfortable  HEENT: PERRLA, oral mucosa moist, no sclera icterus or thrush Respiratory system: Clear to auscultation. Respiratory effort  normal. Cardiovascular system: S1 & S2 heard, RRR.   Gastrointestinal system: Abdomen soft, non-tender, nondistended. Normal bowel sounds. Central nervous system: Alert and oriented. Left arm and leg 1/5 strength Extremities: No cyanosis, clubbing or edema Skin: No rashes or ulcers Psychiatry:  Mood & affect appropriate.     Data Reviewed: I have personally reviewed following labs and imaging studies  CBC: Recent Labs  Lab 03/07/19 0959 03/07/19 1003  WBC 8.2  --   NEUTROABS 5.0  --   HGB 14.1 13.9   HCT 42.1 41.0  MCV 87.9  --   PLT 148*  --    Basic Metabolic Panel: Recent Labs  Lab 03/07/19 0959 03/07/19 1003  NA 141 140  K 4.3 4.2  CL 108 106  CO2 24  --   GLUCOSE 125* 120*  BUN 16 18  CREATININE 1.53* 1.40*  CALCIUM 9.3  --    GFR: CrCl cannot be calculated (Unknown ideal weight.). Liver Function Tests: Recent Labs  Lab 03/07/19 0959  AST 19  ALT 15  ALKPHOS 92  BILITOT 1.0  PROT 7.0  ALBUMIN 4.1   No results for input(s): LIPASE, AMYLASE in the last 168 hours. No results for input(s): AMMONIA in the last 168 hours. Coagulation Profile: Recent Labs  Lab 03/07/19 0959  INR 1.0   Cardiac Enzymes: No results for input(s): CKTOTAL, CKMB, CKMBINDEX, TROPONINI in the last 168 hours. BNP (last 3 results) No results for input(s): PROBNP in the last 8760 hours. HbA1C: No results for input(s): HGBA1C in the last 72 hours. CBG: Recent Labs  Lab 03/07/19 0958 03/07/19 1606 03/07/19 2204 03/08/19 0852 03/08/19 1320  GLUCAP 119* 126* 238* 193* 288*   Lipid Profile: Recent Labs    03/08/19 0327  CHOL 207*  HDL 31*  LDLCALC 144*  TRIG 160*  CHOLHDL 6.7   Thyroid Function Tests: Recent Labs    03/08/19 1115  TSH 3.531   Anemia Panel: No results for input(s): VITAMINB12, FOLATE, FERRITIN, TIBC, IRON, RETICCTPCT in the last 72 hours. Urine analysis:    Component Value Date/Time   COLORURINE YELLOW 05/31/2014 1059   APPEARANCEUR CLEAR 05/31/2014 1059   LABSPEC 1.017 05/31/2014 1059   PHURINE 6.0 05/31/2014 1059   GLUCOSEU NEGATIVE 05/31/2014 1059   HGBUR NEGATIVE 05/31/2014 1059   BILIRUBINUR NEGATIVE 05/31/2014 1059   KETONESUR NEGATIVE 05/31/2014 1059   PROTEINUR NEGATIVE 05/31/2014 1059   UROBILINOGEN 0.2 05/31/2014 1059   NITRITE NEGATIVE 05/31/2014 1059   LEUKOCYTESUR NEGATIVE 05/31/2014 1059   Sepsis Labs: @LABRCNTIP (procalcitonin:4,lacticidven:4) ) Recent Results (from the past 240 hour(s))  SARS CORONAVIRUS 2 (TAT 6-24 HRS)  Nasopharyngeal Nasopharyngeal Swab     Status: None   Collection Time: 03/07/19  3:53 PM   Specimen: Nasopharyngeal Swab  Result Value Ref Range Status   SARS Coronavirus 2 NEGATIVE NEGATIVE Final    Comment: (NOTE) SARS-CoV-2 target nucleic acids are NOT DETECTED. The SARS-CoV-2 RNA is generally detectable in upper and lower respiratory specimens during the acute phase of infection. Negative results do not preclude SARS-CoV-2 infection, do not rule out co-infections with other pathogens, and should not be used as the sole basis for treatment or other patient management decisions. Negative results must be combined with clinical observations, patient history, and epidemiological information. The expected result is Negative. Fact Sheet for Patients: SugarRoll.be Fact Sheet for Healthcare Providers: https://www.woods-mathews.com/ This test is not yet approved or cleared by the [place] FDA and  has been authorized for detection and/or diagnosis of  SARS-CoV-2 by FDA under an Emergency Use Authorization (EUA). This EUA will remain  in effect (meaning this test can be used) for the duration of the COVID-19 declaration under Section 56 4(b)(1) of the Act, 21 U.S.C. section 360bbb-3(b)(1), unless the authorization is terminated or revoked sooner. Performed at [facility], [place] [address], [place], [place] [address]          Radiology Studies: Ct Angio Head W Or Wo Contrast  Result Date: 03/07/2019 CLINICAL DATA:  Left-sided weakness EXAM: CT ANGIOGRAPHY HEAD AND NECK TECHNIQUE: Multidetector CT imaging of the head and neck was performed using the standard protocol during bolus administration of intravenous contrast. Multiplanar CT image reconstructions and MIPs were obtained to evaluate the vascular anatomy. Carotid stenosis measurements (when applicable) are obtained utilizing NASCET criteria, using the distal internal carotid diameter  as the denominator. CONTRAST:  17mL OMNIPAQUE IOHEXOL 350 MG/ML SOLN COMPARISON:  None. FINDINGS: CTA NECK FINDINGS Aortic arch: Mild atherosclerotic plaque.  Three vessel branching. Right carotid system: Mixed density plaque at the ICA bulb/bifurcation with 25% ICA origin narrowing. Negative for ulceration or beading. Left carotid system: Mild atherosclerotic plaque at the bifurcation. No stenosis or ulceration. Vertebral arteries: Proximal subclavian atherosclerosis without flow limiting stenosis. Strong right vertebral artery dominance. No acquired vertebral stenosis. Skeleton: Spondylosis with bridging cervical and upper thoracic osteophytes. Other neck: Negative Upper chest: Negative Review of the MIP images confirms the above findings CTA HEAD FINDINGS Anterior circulation: Atherosclerotic calcification on the carotid siphons. No branch occlusion or proximal flow limiting stenosis. Negative for aneurysm. Posterior circulation: Strong right vertebral artery dominance. Most left vertebral flow is to the PICA. Basilar is diffusely patent and small in the setting of bilateral fetal type PCA. No branch occlusion. Negative for aneurysm Venous sinuses: Diffusely patent Anatomic variants: As above Review of the MIP images confirms the above findings IMPRESSION: 1. No emergent finding. 2. Atherosclerosis without flow limiting stenosis in the head and neck. Electronically Signed   By: Monte Fantasia M.D.   On: 03/07/2019 10:54   Ct Angio Neck W Or Wo Contrast  Result Date: 03/07/2019 CLINICAL DATA:  Left-sided weakness EXAM: CT ANGIOGRAPHY HEAD AND NECK TECHNIQUE: Multidetector CT imaging of the head and neck was performed using the standard protocol during bolus administration of intravenous contrast. Multiplanar CT image reconstructions and MIPs were obtained to evaluate the vascular anatomy. Carotid stenosis measurements (when applicable) are obtained utilizing NASCET criteria, using the distal internal carotid  diameter as the denominator. CONTRAST:  17mL OMNIPAQUE IOHEXOL 350 MG/ML SOLN COMPARISON:  None. FINDINGS: CTA NECK FINDINGS Aortic arch: Mild atherosclerotic plaque.  Three vessel branching. Right carotid system: Mixed density plaque at the ICA bulb/bifurcation with 25% ICA origin narrowing. Negative for ulceration or beading. Left carotid system: Mild atherosclerotic plaque at the bifurcation. No stenosis or ulceration. Vertebral arteries: Proximal subclavian atherosclerosis without flow limiting stenosis. Strong right vertebral artery dominance. No acquired vertebral stenosis. Skeleton: Spondylosis with bridging cervical and upper thoracic osteophytes. Other neck: Negative Upper chest: Negative Review of the MIP images confirms the above findings CTA HEAD FINDINGS Anterior circulation: Atherosclerotic calcification on the carotid siphons. No branch occlusion or proximal flow limiting stenosis. Negative for aneurysm. Posterior circulation: Strong right vertebral artery dominance. Most left vertebral flow is to the PICA. Basilar is diffusely patent and small in the setting of bilateral fetal type PCA. No branch occlusion. Negative for aneurysm Venous sinuses: Diffusely patent Anatomic variants: As above Review of the MIP images confirms the  above findings IMPRESSION: 1. No emergent finding. 2. Atherosclerosis without flow limiting stenosis in the head and neck. Electronically Signed   By: Monte Fantasia M.D.   On: 03/07/2019 10:54   Mr Brain Wo Contrast  Result Date: 03/07/2019 CLINICAL DATA:  Left-sided weakness this morning EXAM: MRI HEAD WITHOUT CONTRAST TECHNIQUE: Multiplanar, multiecho pulse sequences of the brain and surrounding structures were obtained without intravenous contrast. COMPARISON:  Head CT and CTA from earlier today FINDINGS: Brain: Acute perforator infarct on the right affecting corona radiata, posterior caudate, and upper putamen. No pre-existing infarct. No hemorrhage, hydrocephalus,  collection, or masslike finding Vascular: No change from prior CTA. Skull and upper cervical spine: Negative for marrow lesion. Sinuses/Orbits: Negative IMPRESSION: Acute perforator infarct at the right basal ganglia and corona radiata. Electronically Signed   By: Monte Fantasia M.D.   On: 03/07/2019 14:31   Ct Head Code Stroke Wo Contrast  Result Date: 03/07/2019 CLINICAL DATA:  Code stroke.  Left-sided weakness EXAM: CT HEAD WITHOUT CONTRAST TECHNIQUE: Contiguous axial images were obtained from the base of the skull through the vertex without intravenous contrast. COMPARISON:  None. FINDINGS: Brain: No evidence of acute infarction, hemorrhage, hydrocephalus, extra-axial collection or mass lesion/mass effect. Vascular: No hyperdense vessel.  Atherosclerotic calcification. Skull: Normal. Negative for fracture or focal lesion. Sinuses/Orbits: Essentially clear sinuses. Bone island or other benign process posterior to the right sphenoid sinus. Other: These results were communicated to Dr. Rory Percy at 10:11 amon 9/7/2020by text page via the [facility] messaging system. ASPECTS [facility] Stroke Program Early CT Score) - Ganglionic level infarction (caudate, lentiform nuclei, internal capsule, insula, M1-M3 cortex): 7 - Supraganglionic infarction (M4-M6 cortex): 3 Total score (0-10 with 10 being normal): 10 IMPRESSION: 1. No acute finding. 2. ASPECTS is 10. Electronically Signed   By: Monte Fantasia M.D.   On: 03/07/2019 10:12      Scheduled Meds:  aspirin EC  81 mg Oral Daily   clopidogrel  75 mg Oral Daily   enoxaparin (LOVENOX) injection  40 mg Subcutaneous Q24H   insulin aspart  0-15 Units Subcutaneous TID WC   insulin aspart  0-5 Units Subcutaneous QHS   mometasone-formoterol  2 puff Inhalation BID   Continuous Infusions:   LOS: 1 day      Debbe Odea, MD [facility] Pager: [email or web address] Password [email or web address] 03/08/2019, 2:02 PM

## 2019-03-08 NOTE — Progress Notes (Signed)
STROKE TEAM PROGRESS NOTE   INTERVAL HISTORY Pt daughter at the bedside.  Patient still has left-sided weakness.  MRI showed right subcortical infarct.  She did stated that she has some palpitations once a while, could happen at rest.  She agrees with 30-day Cardiac event monitoring.  Vitals:   03/07/19 2212 03/08/19 0044 03/08/19 0412 03/08/19 0753  BP: (!) 145/51 (!) 162/62 (!) 159/66   Pulse: (!) 57 (!) 56 66   Resp:      Temp: 98.8 F (37.1 C) 98.1 F (36.7 C) 97.7 F (36.5 C)   TempSrc: Oral Oral Oral   SpO2: 97% 99% 100% 99%    CBC:  Recent Labs  Lab 03/07/19 0959 03/07/19 1003  WBC 8.2  --   NEUTROABS 5.0  --   HGB 14.1 13.9  HCT 42.1 41.0  MCV 87.9  --   PLT 148*  --     Basic Metabolic Panel:  Recent Labs  Lab 03/07/19 0959 03/07/19 1003  NA 141 140  K 4.3 4.2  CL 108 106  CO2 24  --   GLUCOSE 125* 120*  BUN 16 18  CREATININE 1.53* 1.40*  CALCIUM 9.3  --    Lipid Panel:     Component Value Date/Time   CHOL 207 (H) 03/08/2019 0327   TRIG 160 (H) 03/08/2019 0327   HDL 31 (L) 03/08/2019 0327   CHOLHDL 6.7 03/08/2019 0327   VLDL 32 03/08/2019 0327   LDLCALC 144 (H) 03/08/2019 0327   HgbA1c:  Lab Results  Component Value Date   HGBA1C 7.2 (H) 11/01/2018   Urine Drug Screen: No results found for: LABOPIA, COCAINSCRNUR, LABBENZ, AMPHETMU, THCU, LABBARB  Alcohol Level No results found for: ETH  IMAGING Ct Angio Head W Or Wo Contrast  Result Date: 03/07/2019 CLINICAL DATA:  Left-sided weakness EXAM: CT ANGIOGRAPHY HEAD AND NECK TECHNIQUE: Multidetector CT imaging of the head and neck was performed using the standard protocol during bolus administration of intravenous contrast. Multiplanar CT image reconstructions and MIPs were obtained to evaluate the vascular anatomy. Carotid stenosis measurements (when applicable) are obtained utilizing NASCET criteria, using the distal internal carotid diameter as the denominator. CONTRAST:  151mL OMNIPAQUE IOHEXOL  350 MG/ML SOLN COMPARISON:  None. FINDINGS: CTA NECK FINDINGS Aortic arch: Mild atherosclerotic plaque.  Three vessel branching. Right carotid system: Mixed density plaque at the ICA bulb/bifurcation with 25% ICA origin narrowing. Negative for ulceration or beading. Left carotid system: Mild atherosclerotic plaque at the bifurcation. No stenosis or ulceration. Vertebral arteries: Proximal subclavian atherosclerosis without flow limiting stenosis. Strong right vertebral artery dominance. No acquired vertebral stenosis. Skeleton: Spondylosis with bridging cervical and upper thoracic osteophytes. Other neck: Negative Upper chest: Negative Review of the MIP images confirms the above findings CTA HEAD FINDINGS Anterior circulation: Atherosclerotic calcification on the carotid siphons. No branch occlusion or proximal flow limiting stenosis. Negative for aneurysm. Posterior circulation: Strong right vertebral artery dominance. Most left vertebral flow is to the PICA. Basilar is diffusely patent and small in the setting of bilateral fetal type PCA. No branch occlusion. Negative for aneurysm Venous sinuses: Diffusely patent Anatomic variants: As above Review of the MIP images confirms the above findings IMPRESSION: 1. No emergent finding. 2. Atherosclerosis without flow limiting stenosis in the head and neck. Electronically Signed   By: Monte Fantasia M.D.   On: 03/07/2019 10:54   Ct Angio Neck W Or Wo Contrast  Result Date: 03/07/2019 CLINICAL DATA:  Left-sided weakness EXAM: CT ANGIOGRAPHY HEAD  AND NECK TECHNIQUE: Multidetector CT imaging of the head and neck was performed using the standard protocol during bolus administration of intravenous contrast. Multiplanar CT image reconstructions and MIPs were obtained to evaluate the vascular anatomy. Carotid stenosis measurements (when applicable) are obtained utilizing NASCET criteria, using the distal internal carotid diameter as the denominator. CONTRAST:  143mL OMNIPAQUE  IOHEXOL 350 MG/ML SOLN COMPARISON:  None. FINDINGS: CTA NECK FINDINGS Aortic arch: Mild atherosclerotic plaque.  Three vessel branching. Right carotid system: Mixed density plaque at the ICA bulb/bifurcation with 25% ICA origin narrowing. Negative for ulceration or beading. Left carotid system: Mild atherosclerotic plaque at the bifurcation. No stenosis or ulceration. Vertebral arteries: Proximal subclavian atherosclerosis without flow limiting stenosis. Strong right vertebral artery dominance. No acquired vertebral stenosis. Skeleton: Spondylosis with bridging cervical and upper thoracic osteophytes. Other neck: Negative Upper chest: Negative Review of the MIP images confirms the above findings CTA HEAD FINDINGS Anterior circulation: Atherosclerotic calcification on the carotid siphons. No branch occlusion or proximal flow limiting stenosis. Negative for aneurysm. Posterior circulation: Strong right vertebral artery dominance. Most left vertebral flow is to the PICA. Basilar is diffusely patent and small in the setting of bilateral fetal type PCA. No branch occlusion. Negative for aneurysm Venous sinuses: Diffusely patent Anatomic variants: As above Review of the MIP images confirms the above findings IMPRESSION: 1. No emergent finding. 2. Atherosclerosis without flow limiting stenosis in the head and neck. Electronically Signed   By: Monte Fantasia M.D.   On: 03/07/2019 10:54   Mr Brain Wo Contrast  Result Date: 03/07/2019 CLINICAL DATA:  Left-sided weakness this morning EXAM: MRI HEAD WITHOUT CONTRAST TECHNIQUE: Multiplanar, multiecho pulse sequences of the brain and surrounding structures were obtained without intravenous contrast. COMPARISON:  Head CT and CTA from earlier today FINDINGS: Brain: Acute perforator infarct on the right affecting corona radiata, posterior caudate, and upper putamen. No pre-existing infarct. No hemorrhage, hydrocephalus, collection, or masslike finding Vascular: No change from  prior CTA. Skull and upper cervical spine: Negative for marrow lesion. Sinuses/Orbits: Negative IMPRESSION: Acute perforator infarct at the right basal ganglia and corona radiata. Electronically Signed   By: Monte Fantasia M.D.   On: 03/07/2019 14:31   Ct Head Code Stroke Wo Contrast  Result Date: 03/07/2019 CLINICAL DATA:  Code stroke.  Left-sided weakness EXAM: CT HEAD WITHOUT CONTRAST TECHNIQUE: Contiguous axial images were obtained from the base of the skull through the vertex without intravenous contrast. COMPARISON:  None. FINDINGS: Brain: No evidence of acute infarction, hemorrhage, hydrocephalus, extra-axial collection or mass lesion/mass effect. Vascular: No hyperdense vessel.  Atherosclerotic calcification. Skull: Normal. Negative for fracture or focal lesion. Sinuses/Orbits: Essentially clear sinuses. Bone island or other benign process posterior to the right sphenoid sinus. Other: These results were communicated to Dr. Rory Percy at 10:11 amon 9/7/2020by text page via the [facility] messaging system. ASPECTS [facility] Stroke Program Early CT Score) - Ganglionic level infarction (caudate, lentiform nuclei, internal capsule, insula, M1-M3 cortex): 7 - Supraganglionic infarction (M4-M6 cortex): 3 Total score (0-10 with 10 being normal): 10 IMPRESSION: 1. No acute finding. 2. ASPECTS is 10. Electronically Signed   By: Monte Fantasia M.D.   On: 03/07/2019 10:12    PHYSICAL EXAM  Temp:  [97.7 F (36.5 C)-98.8 F (37.1 C)] 98.5 F (36.9 C) (09/08 1615) Pulse Rate:  [56-76] 76 (09/08 1615) Resp:  [14-17] 17 (09/08 1615) BP: (145-182)/(51-71) 174/65 (09/08 1615) SpO2:  [96 %-100 %] 96 % (09/08 1615)  General - Well nourished, well developed, in  no apparent distress.  Ophthalmologic - fundi not visualized due to noncooperation.  Cardiovascular - Regular rate and rhythm.  Mental Status -  Level of arousal and orientation to time, place, and person were intact. Language including expression, naming,  repetition, comprehension was assessed and found intact. Fund of Knowledge was assessed and was intact.  Cranial Nerves II - XII - II - Visual field intact OU. III, IV, VI - Extraocular movements intact. V - Facial sensation intact bilaterally. VII - Facial movement intact bilaterally. VIII - Hearing & vestibular intact bilaterally. X - Palate elevates symmetrically. XI - Chin turning & shoulder shrug intact bilaterally. XII - Tongue protrusion intact.  Motor Strength - The patient's strength was normal in right side extremities, however left upper extremity proximal 2-/5 and distal 3/5, left lower extremity proximal 3-/5 and distal 0/5.  Bulk was normal and fasciculations were absent.   Motor Tone - Muscle tone was assessed at the neck and appendages and was normal.  Reflexes - The patient's reflexes were symmetrical in all extremities and she had no pathological reflexes.  Sensory - Light touch, temperature/pinprick were assessed and were symmetrical.    Coordination - The patient had normal movements in the right hand with no ataxia or dysmetria.  Tremor was absent.  Gait and Station - deferred.   ASSESSMENT/PLAN Ms. LESHON RUEGER is a 78 y.o. female with history of iabetes, thyroid disease, sensorineural hearing loss, hypothyroidism, hypertension, hyperlipidemia, cluster headaches, CAD, CKD stage III presenting with left-sided weakness and decreased sensation.   Stroke: R AchA territory infarct - most likely small vessel disease. However, Embolic event can not rule out given the AchA is coming out directly from carotid artery  Code Stroke CT head No acute abnormality. ASPECTS 10.     CTA head & neck no ELVO.  Atherosclerosis.  MRI R basal ganglia and corona radiata infarct at right AchA territory  2D Echo EF 60-65%  Recommend 30 day cardiac event monitoring as outpt to rule out afib  LDL 144  HgbA1c 7.2  Lovenox 40 mg subcu daily for VTE prophylaxis  No  antithrombotic prior to admission, now on aspirin 81 mg daily and clopidogrel 75 mg daily.  Continue DAPT x3 weeks then aspirin alone.  Therapy recommendations: CIR. consult in place.  Disposition: Pending  Heart palpitation  Intermittent  Could happen at rest  Concerning for arrhythmia including A. Fib  Recommend 30-day cardiac event monitoring as outpatient to rule out A. fib  Hypertension  Stable . Permissive hypertension (OK if < 220/120) but gradually normalize in 5-7 days . Long-term BP goal normotensive  Hyperlipidemia  Home meds: None due to intolerance of statin  LDL 144, goal < 70  Intolerant with pravastatin and other statins  Recommend PCP follow-up for Repatha, patient has been using Repatha at home  Diabetes type II Uncontrolled Diabetic neuropathy  HgbA1c 7.2 in May, goal < 7.0  CBGs  SSI  Close PCP follow-up for better DM control  Other Stroke Risk Factors  Advanced age  Family hx stroke (mother)  Coronary artery disease  Obstructive sleep apnea, on CPAP at home  Other Active Problems  History left breast cancer status post mastectomy 2015  CKD stage III creatinine 1.4  Hypothyroidism  Secondary hyperparathyroidism  Sensorineural hearing loss bilaterally  Hospital day # 1  Neurology will sign off. Please call with questions. Pt will follow up with stroke clinic NP at [facility] in about 4 weeks. Thanks for the consult.  Rosalin Hawking, MD PhD Stroke Neurology 03/08/2019 7:05 PM   To contact Stroke Continuity provider, please refer to [email or web address] After hours, contact General Neurology

## 2019-03-08 NOTE — Evaluation (Signed)
Occupational Therapy Evaluation Patient Details Name: Julia Banks MRN: [id number] DOB: 1940-09-30 Today's Date: 03/08/2019    History of Present Illness  Julia Banks is a 78 y.o. female with medical history significant of hypertension, hyperlipidemia, diabetes, CAD, hypothyroidism, chronic kidney disease stage III, sensory neural hearing loss presents to emergency department with complaining of left-sided numbness and weakness. MRI positive for R basal ganglia and corona radiata infarct.    Clinical Impression   Pt PTA: living alone and independent with ADL, mobility and driving. Pt currently, Pt admitted with CVA and L sided weakness. Pt currently with functional limitiations due to the deficits in L sided weakness, poor coordination, poor mobility and ability to reduce L lateral lean and increased assist required for ADL tasks. Pt minA for UB ADL (naturally R hand dominant), MaxA for LB ADL. Pt modA for sit to stand and bed mobility; stedy for transfers +2. Pt will benefit from skilled OT to increase their independence and safety with adls and balance to allow discharge to CIR for extensive rehab as pt is very motivated to return to PLOF.     Follow Up Recommendations  CIR    Equipment Recommendations  Other (comment)(to be determined by next venue)    Recommendations for Other Services Rehab consult     Precautions / Restrictions Precautions Precautions: Fall Restrictions Weight Bearing Restrictions: No      Mobility Bed Mobility Overal bed mobility: Needs Assistance Bed Mobility: Supine to Sit     Supine to sit: Mod assist;HOB elevated(trunk elevation, RUE with rail use; RLE under LLE to sit EOB)     General bed mobility comments: trunk elevation, RUE with rail use; RLE under LLE to sit EOB  Transfers Overall transfer level: Needs assistance Equipment used: None Transfers: Sit to/from [facility] Sit to Stand: Mod assist;From elevated  surface Stand pivot transfers: Mod assist;From elevated surface       General transfer comment: ModA +1 for sit to stand x1 time and then unable to complete safely after 1x; stedy used with minA+2/modA of 1.    Balance                                           ADL either performed or assessed with clinical judgement   ADL Overall ADL's : Needs assistance/impaired Eating/Feeding: Supervision/ safety;Set up(RUE only)   Grooming: Set up;Supervision/safety;Sitting Grooming Details (indicate cue type and reason): seated in supported chair Upper Body Bathing: Minimal assistance;Sitting   Lower Body Bathing: Maximal assistance;Sitting/lateral leans;Sit to/from stand   Upper Body Dressing : Minimal assistance;Sitting   Lower Body Dressing: Maximal assistance;Sitting/lateral leans;Sit to/from stand   Toilet Transfer: Moderate assistance;+2 for physical assistance;+2 for safety/equipment;Cueing for safety;BSC(using stedy)   Toileting- Clothing Manipulation and Hygiene: Moderate assistance;+2 for physical assistance;+2 for safety/equipment;Sitting/lateral lean;Sit to/from stand       Functional mobility during ADLs: Moderate assistance;Maximal assistance;+2 for physical assistance;+2 for safety/equipment;Cueing for safety General ADL Comments: Stedy for transfer; minA overall for UB and MaxA for LB ADL     Vision Baseline Vision/History: Wears glasses Wears Glasses: Reading only Patient Visual Report: No change from baseline Vision Assessment?: Yes Eye Alignment: Within Functional Limits Ocular Range of Motion: Within Functional Limits Alignment/Gaze Preference: Within Defined Limits     Perception     Praxis      Pertinent Vitals/Pain Pain Assessment: No/denies pain  Hand Dominance Right   Extremity/Trunk Assessment Upper Extremity Assessment Upper Extremity Assessment: Generalized weakness;LUE deficits/detail LUE Deficits / Details: 1/5 in triceps  and grip strength; otherwise nearly flaccid LUE Sensation: decreased light touch LUE Coordination: decreased fine motor;decreased gross motor   Lower Extremity Assessment Lower Extremity Assessment: LLE deficits/detail;Defer to PT evaluation;Generalized weakness LLE Deficits / Details: noted movment in quads and dorsiflexion   Cervical / Trunk Assessment Cervical / Trunk Assessment: Normal   Communication Communication Communication: HOH   Cognition Arousal/Alertness: Awake/alert Behavior During Therapy: WFL for tasks assessed/performed Overall Cognitive Status: Within Functional Limits for tasks assessed                                     General Comments  Pt VSS    Exercises     Shoulder Instructions      Home Living Family/patient expects to be discharged to:: Private residence Living Arrangements: Alone Available Help at Discharge: Family;Available PRN/intermittently Type of Home: House Home Access: Stairs to enter [employer] of Steps: 4 Entrance Stairs-Rails: Left Home Layout: Two level;Able to live on main level with bedroom/bathroom     Bathroom Shower/Tub: Occupational psychologist: Handicapped height Bathroom Accessibility: Yes How Accessible: Accessible via walker Home Equipment: [place] - single point          Prior Functioning/Environment Level of Independence: Independent        Comments: Driving        OT Problem List: Decreased strength;Decreased activity tolerance;Impaired balance (sitting and/or standing);Decreased safety awareness;Impaired UE functional use;Decreased coordination      OT Treatment/Interventions: Self-care/ADL training;Therapeutic exercise;Neuromuscular education;Energy conservation;DME and/or AE instruction;Therapeutic activities;Patient/family education;Balance training    OT Goals(Current goals can be found in the care plan section) Acute Rehab OT Goals Patient Stated Goal: to get my L  side working OT Goal Formulation: With patient Time For Goal Achievement: 03/22/19 Potential to Achieve Goals: Good ADL Goals Pt Will Perform Grooming: standing;with min guard assist Pt Will Perform Lower Body Dressing: with min guard assist;sitting/lateral leans;sit to/from stand Pt Will Transfer to Toilet: with min assist;stand pivot transfer;bedside commode Pt Will Perform Toileting - Clothing Manipulation and hygiene: with min guard assist;sitting/lateral leans;sit to/from stand Pt/caregiver will Perform Home Exercise Program: Left upper extremity;Independently;With written HEP provided Additional ADL Goal #1: Pt will perform OOB ADL with minguardA overall with good safety awareness  OT Frequency: Min 2X/week   Barriers to D/C: Decreased caregiver support  lives alone       Co-evaluation              AM-PAC OT "6 Clicks" Daily Activity     Outcome Measure Help from another person eating meals?: None Help from another person taking care of personal grooming?: A Little Help from another person toileting, which includes using toliet, bedpan, or urinal?: A Lot Help from another person bathing (including washing, rinsing, drying)?: A Lot Help from another person to put on and taking off regular upper body clothing?: A Lot Help from another person to put on and taking off regular lower body clothing?: Total 6 Click Score: 14   End of Session Equipment Utilized During Treatment: Gait belt Nurse Communication: Mobility status;Need for lift equipment  Activity Tolerance: Patient tolerated treatment well;Patient limited by fatigue Patient left: in chair;with call bell/phone within reach;with chair alarm set  OT Visit Diagnosis: Unsteadiness on feet (R26.81);Muscle weakness (generalized) (M62.81)  Time: [id number] OT Time Calculation (min): 39 min Charges:  OT General Charges $OT Visit: 1 Visit OT Evaluation $OT Eval Moderate Complexity: 1 Mod OT  Treatments $Self Care/Home Management : 8-22 mins $Neuromuscular Re-education: 8-22 mins  Darryl Nestle) Marsa Aris OTR/L Acute Rehabilitation Services Pager: [phone] Office: [phone]  Audie Pinto 03/08/2019, 9:24 AM

## 2019-03-08 NOTE — Progress Notes (Signed)
SLP Cancellation Note  Patient Details Name: Julia Banks MRN: [id number] DOB: January 25, 1941   Cancelled treatment:       Reason Eval/Treat Not Completed: Patient at procedure or test/unavailable(Pt with MD at this time. SLP will follow up. )  [name] I. Hardin Negus, [place], [place] Office number [phone] Pager [place] 03/08/2019, 10:28 AM

## 2019-03-08 NOTE — Progress Notes (Signed)
Echocardiogram 2D Echocardiogram has been performed.  Oneal Deputy Phares Zaccone 03/08/2019, 1:21 PM

## 2019-03-08 NOTE — Progress Notes (Signed)
  Rehab Admissions Coordinator Note:  Patient was screened by Cleatrice Burke for appropriateness for an Inpatient Acute Rehab Consult per PT and OT recs.   At this time, we are recommending Inpatient Rehab consult. Please place order.  Cleatrice Burke RN MSN 03/08/2019, 10:26 AM  I can be reached at [phone].

## 2019-03-08 NOTE — Progress Notes (Signed)
Inpatient Rehab Admissions:  Inpatient Rehab Consult received.  I met with pt and her neice at the bedside for rehabilitation assessment and to discuss goals and expectations of an inpatient rehab admission. Pt appears to be a great candidate for CIR and AC has confirmed 24/7 DC support from her family. Pt would like to pursue CIR once medical workup complete. Pt and family to review brochures tonight and [facility] will follow up with pt tomorrow for possible admit.   Jhonnie Garner, OTR/L  Rehab Admissions Coordinator  [phone] 03/08/2019 3:36 PM

## 2019-03-08 NOTE — Evaluation (Signed)
Physical Therapy Evaluation Patient Details Name: Julia Banks MRN: [id number] DOB: 12-22-1940 Today's Date: 03/08/2019   History of Present Illness   Julia Banks is a 78 y.o. female with medical history significant of hypertension, hyperlipidemia, diabetes, CAD, hypothyroidism, chronic kidney disease stage III, sensory neural hearing loss presents to emergency department with complaining of left-sided numbness and weakness. MRI positive for R basal ganglia and corona radiata infarct.   Clinical Impression  Pt admitted with above diagnosis. Pt received in chair, very motivated to get moving. Noted 1/5 L hip flex, 2+/5 L quad (but with very poor neuromuscular control), trace df. Pt pivoted from chair to bed to R with mod A and control of L knee. No equipment used. Pt would benefit from intense, consistent rehab with potential for return to independence in her home. Pt currently with functional limitations due to the deficits listed below (see PT Problem List). Pt will benefit from skilled PT to increase their independence and safety with mobility to allow discharge to the venue listed below.       Follow Up Recommendations CIR    Equipment Recommendations  Other (comment)(TBD)    Recommendations for Other Services Rehab consult     Precautions / Restrictions Precautions Precautions: Fall Restrictions Weight Bearing Restrictions: No      Mobility  Bed Mobility Overal bed mobility: Needs Assistance Bed Mobility: Sit to Supine     Supine to sit: Mod assist;HOB elevated(trunk elevation, RUE with rail use; RLE under LLE to sit EOB) Sit to supine: Mod assist   General bed mobility comments: mod A to LLE, pt with decreased ability to control lowering of trunk to bed  Transfers Overall transfer level: Needs assistance Equipment used: None Transfers: Sit to/from [facility] Sit to Stand: Mod assist Stand pivot transfers: Mod assist       General transfer  comment: mod A for sit<>stand for power up and controlling L knee, mod A for SPT to bed from recliner. Pt with good, strong use of R side  Ambulation/Gait             General Gait Details: unable at this point  Stairs            Wheelchair Mobility    Modified Rankin (Stroke Patients Only)       Balance Overall balance assessment: Needs assistance Sitting-balance support: Single extremity supported;Feet supported Sitting balance-Leahy Scale: Poor Sitting balance - Comments: intermittient posterior LOB with min A to correct Postural control: Posterior lean Standing balance support: Bilateral upper extremity supported Standing balance-Leahy Scale: Poor Standing balance comment: requires mod A and assist at L knee to stand                             Pertinent Vitals/Pain Pain Assessment: No/denies pain    Home Living Family/patient expects to be discharged to:: Private residence Living Arrangements: Alone Available Help at Discharge: Family;Available PRN/intermittently Type of Home: House Home Access: Stairs to enter Entrance Stairs-Rails: Left Entrance Stairs-Number of Steps: 4 Home Layout: Two level;Able to live on main level with bedroom/bathroom Home Equipment: Kasandra Knudsen - single point Additional Comments: niece available to help intermittently    Prior Function Level of Independence: Independent         Comments: Driving     Hand Dominance   Dominant Hand: Right    Extremity/Trunk Assessment   Upper Extremity Assessment Upper Extremity Assessment: Defer to OT evaluation  LUE Deficits / Details: 1/5 in triceps and grip strength; otherwise nearly flaccid LUE Sensation: decreased light touch LUE Coordination: decreased fine motor;decreased gross motor    Lower Extremity Assessment Lower Extremity Assessment: LLE deficits/detail LLE Deficits / Details: hip flex 1/5, knee ext 2+/5 but with poor control, 1/5 df LLE Sensation: decreased  light touch;decreased proprioception LLE Coordination: decreased gross motor    Cervical / Trunk Assessment Cervical / Trunk Assessment: Other exceptions Cervical / Trunk Exceptions: noted decreased trunk control  Communication   Communication: HOH  Cognition Arousal/Alertness: Awake/alert Behavior During Therapy: WFL for tasks assessed/performed Overall Cognitive Status: Within Functional Limits for tasks assessed                                        General Comments General comments (skin integrity, edema, etc.): pt very motivated    Exercises General Exercises - Lower Extremity Ankle Circles/Pumps: AROM;Both;10 reps;Seated;Limitations Ankle Circles/Pumps Limitations: educated on attempting even if she does not see or feel mvmt Quad Sets: AROM;Both;10 reps;Seated Hip ABduction/ADduction: AAROM;Left;5 reps;Seated   Assessment/Plan    PT Assessment Patient needs continued PT services  PT Problem List Decreased strength;Decreased range of motion;Decreased activity tolerance;Decreased balance;Decreased mobility;Decreased coordination;Decreased knowledge of use of DME;Decreased knowledge of precautions;Impaired sensation;Impaired tone       PT Treatment Interventions DME instruction;Gait training;Functional mobility training;Therapeutic activities;Therapeutic exercise;Balance training;Neuromuscular re-education;Patient/family education    PT Goals (Current goals can be found in the Care Plan section)  Acute Rehab PT Goals Patient Stated Goal: to get my L side working PT Goal Formulation: With patient Time For Goal Achievement: 03/22/19 Potential to Achieve Goals: Good    Frequency Min 4X/week   Barriers to discharge        Co-evaluation               AM-PAC PT "6 Clicks" Mobility  Outcome Measure Help needed turning from your back to your side while in a flat bed without using bedrails?: A Lot Help needed moving from lying on your back to  sitting on the side of a flat bed without using bedrails?: A Lot Help needed moving to and from a bed to a chair (including a wheelchair)?: A Lot Help needed standing up from a chair using your arms (e.g., wheelchair or bedside chair)?: A Lot Help needed to walk in hospital room?: Total Help needed climbing 3-5 steps with a railing? : Total 6 Click Score: 10    End of Session Equipment Utilized During Treatment: Gait belt Activity Tolerance: Patient tolerated treatment well Patient left: in bed;with call bell/phone within reach;with bed alarm set Nurse Communication: Mobility status PT Visit Diagnosis: Unsteadiness on feet (R26.81);Muscle weakness (generalized) (M62.81);Hemiplegia and hemiparesis Hemiplegia - Right/Left: Left Hemiplegia - dominant/non-dominant: Non-dominant Hemiplegia - caused by: Cerebral infarction    Time: [id number] PT Time Calculation (min) (ACUTE ONLY): 26 min   Charges:   PT Evaluation $PT Eval Moderate Complexity: 1 Mod PT Treatments $Therapeutic Activity: 8-22 mins        Leighton Roach, PT  Acute Rehab Services  Pager [phone] Office [place] 03/08/2019, 10:09 AM

## 2019-03-08 NOTE — PMR Pre-admission (Signed)
PMR Admission Coordinator Pre-Admission Assessment  Patient: Julia Banks is an 78 y.o., female MRN: [id number] DOB: 1941/02/24 Height:   Weight:    Insurance Information HMO:     PPO:      PCP:      IPA:     80/20: yes     OTHER:  PRIMARY: Medicare Part A and B      Policy#: [id number]      Subscriber: Patient CM Name:       Phone#:      Fax#:  Pre-Cert#:       Employer:  Benefits:  Phone #: NA     Name: verified eligibility on 9/8 via [employer] Eff. Date: Part A and B effective 01/29/08     Deduct: $1,408      Out of Pocket Max: NA      Life Max: NA CIR: Covered per Medicare guidelines once yearly deductible has been met      SNF: days 1-20, 100%, days 21-100, 80% Outpatient: 80%     Co-Pay: 20% Home Health: 100%      Co-Pay:  DME: 80%     Co-Pay: 20% Providers: Pt's choice SECONDARY: [employer]      Policy#: [id number]      Subscriber: Patient CM Name:       Phone#:      Fax#:  Pre-Cert#:       Employer:  Benefits:  Phone #:  [phone]    Name:  Eff. Date:      Deduct:       Out of Pocket Max:       Life Max:  CIR:       SNF:  Outpatient:      Co-Pay:  Home Health:       Co-Pay:  DME:      Co-Pay:   Medicaid Application Date:       Case Manager:  Disability Application Date:      Case Worker:   The "Data Collection Information Summary" for patients in Inpatient Rehabilitation Facilities with attached "Privacy Act [place] Records" was provided and verbally reviewed with: Patient  Emergency Contact Information Contact Information    Name Relation Home Work Mobile   Flinchum,Michelle Niece [phone]        Current Medical History  Patient Admitting Diagnosis: Right basal ganglia and corona radiata infarct  History of Present Illness: Julia Banks is a 78 year old right-handed female with history of hypertension, hyperlipidemia, diabetes mellitus, sensorineural hearing loss, CAD, CKD stage III. Pt presented 03/07/2019 with left side  weakness as well as mild dysarthria.  Cranial CT scan negative.  Patient did not receive TPA.  CT angiogram of head and neck with no emergent findings.  MRI showed acute perforator infarct at the right basal ganglia and corona radiata.  Echocardiogram with ejection fraction of 46% normal systolic function.  Neurology follow-up maintained on aspirin and Plavix for CVA prophylaxis x3 weeks then aspirin alone.  Subcutaneous Lovenox for DVT prophylaxis.  Plan for 30-day cardiac event monitor.  Tolerating a regular diet.  Speech therapy has signed off.  Therapy evaluations completed with CIR recommended and patient is to be admitted for a comprehensive rehab program on 03/09/2019.   Complete NIHSS TOTAL: 6  Patient's medical record from [facility] has been reviewed by the rehabilitation admission coordinator and physician.  Past Medical History  Past Medical History:  Diagnosis Date  . Arthritis   . Asthma   .  Breast cancer, left breast ([place]) 02/2014   DCIS, ER+ PR- initially rec bilat mastectomy with sentinal LN biopsy L axilla but now planning just L mastectomy [name])  . Chronic kidney disease, stage 3 ([place])   . Colon polyps    benign  . Complication of anesthesia    hard to wake up  . Coronary artery disease    30% stenosis reported on previous cardiac catheterization in 2006. This was done in [facility]  . GERD (gastroesophageal reflux disease)   . Headaches, cluster   . Hyperlipidemia   . Hypertension   . Hypothyroidism   . Osteopenia 07/2014   DEXA 0.5 spine, 01.7 R femoral neck  . Pneumonia    years ago  . Secondary hyperparathyroidism of renal origin ([place])    likely  . Sensorineural hearing loss (SNHL) of both ears 01/27/2016   Audiology eval - moderately severe sensorineural hearing loss bilaterally rec amplification (07/2016 - [name] audiologist)  . Sleep apnea    on CPAP  . Thyroid disease   . Type 2 diabetes,  uncontrolled, with neuropathy (HCC)    followed by endo Dr Buddy Duty    Family History   family history includes Arthritis in her father and mother; Breast cancer in an other family member; CAD in her father; Colon cancer in her maternal uncle; Diabetes in her mother; Hyperlipidemia in her father and mother; Hypertension in her father and mother; Leukemia in her brother; Melanoma (age of onset: 75) in an other family member; Other in her mother; Ovarian cancer in an other family member; Ovarian cancer (age of onset: 59) in her maternal grandmother; Stroke in her mother.  Prior Rehab/Hospitalizations Has the patient had prior rehab or hospitalizations prior to admission? No  Has the patient had major surgery during 100 days prior to admission? No   Current Medications  Current Facility-Administered Medications:  .  acetaminophen (TYLENOL) tablet 650 mg, 650 mg, Oral, Q4H PRN **OR** acetaminophen (TYLENOL) solution 650 mg, 650 mg, Per Tube, Q4H PRN **OR** acetaminophen (TYLENOL) suppository 650 mg, 650 mg, Rectal, Q4H PRN, Pahwani, Rinka R, MD .  albuterol (PROVENTIL) (2.5 MG/3ML) 0.083% nebulizer solution 2.5 mg, 2.5 mg, Nebulization, Q6H PRN, Pahwani, Rinka R, MD .  aspirin EC tablet 81 mg, 81 mg, Oral, Daily, Rosalin Hawking, MD, 81 mg at 03/09/19 1045 .  clopidogrel (PLAVIX) tablet 75 mg, 75 mg, Oral, Daily, Rosalin Hawking, MD, 75 mg at 03/09/19 1045 .  enoxaparin (LOVENOX) injection 40 mg, 40 mg, Subcutaneous, Q24H, Pahwani, Rinka R, MD, 40 mg at 03/08/19 1338 .  insulin aspart (novoLOG) injection 0-15 Units, 0-15 Units, Subcutaneous, TID WC, Pahwani, Rinka R, MD, 3 Units at 03/09/19 0730 .  insulin aspart (novoLOG) injection 0-5 Units, 0-5 Units, Subcutaneous, QHS, Pahwani, Rinka R, MD, 3 Units at 03/08/19 2209 .  insulin glargine (LANTUS) injection 40 Units, 40 Units, Subcutaneous, Q2200, Debbe Odea, MD, 40 Units at 03/08/19 2211 .  [START ON 03/10/2019] levothyroxine (SYNTHROID) tablet 25 mcg, 25  mcg, Oral, Once per day on Sun Tue Thu Sat, Rizwan, Eunice Blase, MD .  levothyroxine (SYNTHROID) tablet 50 mcg, 50 mcg, Oral, Once per day on Mon Wed Fri, Rizwan, Eunice Blase, MD, 50 mcg at 03/09/19 3825 .  mometasone-formoterol (DULERA) 200-5 MCG/ACT inhaler 2 puff, 2 puff, Inhalation, BID, Pahwani, Rinka R, MD, 2 puff at 03/09/19 0818 .  pantoprazole (PROTONIX) EC tablet 40 mg, 40 mg, Oral, Daily, Rizwan, Saima, MD, 40 mg at 03/09/19 1045  Patients Current Diet:  Diet Order            Diet - low sodium heart healthy        Diet Heart Room service appropriate? Yes with Assist; Fluid consistency: Thin  Diet effective now              Precautions / Restrictions Precautions Precautions: Fall Restrictions Weight Bearing Restrictions: No   Has the patient had 2 or more falls or a fall with injury in the past year? No  Prior Activity Level Community (5-7x/wk): very active PTA, mowed lawn, went outside, did crafts. Independent PTA  Prior Functional Level Self Care: Did the patient need help bathing, dressing, using the toilet or eating? Independent  Indoor Mobility: Did the patient need assistance with walking from room to room (with or without device)? Independent  Stairs: Did the patient need assistance with internal or external stairs (with or without device)? Independent  Functional Cognition: Did the patient need help planning regular tasks such as shopping or remembering to take medications? Independent  Home Assistive Devices / Equipment Home Equipment: Cane - single point  Prior Device Use: Indicate devices/aids used by the patient prior to current illness, exacerbation or injury? None of the above  Current Functional Level Cognition  Arousal/Alertness: Awake/alert Overall Cognitive Status: Within Functional Limits for tasks assessed Orientation Level: Oriented X4 Attention: Sustained, Focused Focused Attention: Appears intact(Vigilance WNL: 1/1) Sustained Attention: Appears  intact(Serial 7s: refused: Serial 5s: 3/3) Memory: Impaired Memory Impairment: Storage deficit, Retrieval deficit, Decreased recall of new information(Immediate: 5/5; delayed: 3/5; with cues: 2/2) Awareness: Appears intact Problem Solving: Appears intact Executive Function: Reasoning, Sequencing, Organizing Reasoning: Appears intact(Abstraction: 2/2) Sequencing: Appears intact(Clock drawing: 3/3) Organizing: Appears intact(Backaward digit span: 1/1)    Extremity Assessment (includes Sensation/Coordination)  Upper Extremity Assessment: Defer to OT evaluation LUE Deficits / Details: 1/5 in triceps and grip strength; otherwise nearly flaccid LUE Sensation: decreased light touch LUE Coordination: decreased fine motor, decreased gross motor  Lower Extremity Assessment: LLE deficits/detail LLE Deficits / Details: hip flex 1/5, knee ext 2+/5 but with poor control, 1/5 df LLE Sensation: decreased light touch, decreased proprioception LLE Coordination: decreased gross motor    ADLs  Overall ADL's : Needs assistance/impaired Eating/Feeding: Supervision/ safety, Set up(RUE only) Grooming: Set up, Supervision/safety, Sitting Grooming Details (indicate cue type and reason): seated in supported chair Upper Body Bathing: Minimal assistance, Sitting Lower Body Bathing: Maximal assistance, Sitting/lateral leans, Sit to/from stand Upper Body Dressing : Minimal assistance, Sitting Lower Body Dressing: Maximal assistance, Sitting/lateral leans, Sit to/from stand Toilet Transfer: Moderate assistance, +2 for physical assistance, +2 for safety/equipment, Cueing for safety, BSC(using stedy) Toileting- Clothing Manipulation and Hygiene: Moderate assistance, +2 for physical assistance, +2 for safety/equipment, Sitting/lateral lean, Sit to/from stand Functional mobility during ADLs: Moderate assistance, Maximal assistance, +2 for physical assistance, +2 for safety/equipment, Cueing for safety General ADL  Comments: Stedy for transfer; minA overall for UB and MaxA for LB ADL    Mobility  Overal bed mobility: Needs Assistance Bed Mobility: Sit to Supine Supine to sit: Mod assist, HOB elevated(trunk elevation, RUE with rail use; RLE under LLE to sit EOB) Sit to supine: Mod assist General bed mobility comments: mod A to LLE, pt with decreased ability to control lowering of trunk to bed    Transfers  Overall transfer level: Needs assistance Equipment used: None Transfer via Lift Equipment: Stedy Transfers: Sit to/from Stand, [employer] Transfers Sit to Stand: Mod assist Stand pivot transfers: Mod  assist General transfer comment: mod A for sit<>stand for power up and controlling L knee, mod A for SPT to bed from recliner. Pt with good, strong use of R side    Ambulation / Gait / Stairs / Wheelchair Mobility  Ambulation/Gait General Gait Details: unable at this point    Posture / Balance Dynamic Sitting Balance Sitting balance - Comments: intermittient posterior LOB with min A to correct Balance Overall balance assessment: Needs assistance Sitting-balance support: Single extremity supported, Feet supported Sitting balance-Leahy Scale: Poor Sitting balance - Comments: intermittient posterior LOB with min A to correct Postural control: Posterior lean Standing balance support: Bilateral upper extremity supported Standing balance-Leahy Scale: Poor Standing balance comment: requires mod A and assist at L knee to stand    Special needs/care consideration BiPAP/CPAP : has CPAP at home but reports she does not use regularily CPM : no Continuous Drip IV : no Dialysis : no     Days : no Life Vest : no Oxygen : no, RA Special Bed : no Trach Size : no Wound Vac (area) : no      Location : no Skin : pt has history of mastectomy of Left breast, no further areas of concern documented.         Bowel mgmt: continent, last BM 03/06/2019 per chart Bladder mgmt: external catheter in place, continent  per pt report Diabetic mgmt: yes Behavioral consideration : no Chemo/radiation: no   Previous Home Environment (from acute therapy documentation) Living Arrangements: Alone  Lives With: Alone Available Help at Discharge: Family, Available 24 hours/day Type of Home: House Home Layout: Two level, Able to live on main level with bedroom/bathroom Home Access: Stairs to enter Entrance Stairs-Rails: Left Entrance Stairs-Number of Steps: 4 Bathroom Shower/Tub: Multimedia programmer: Handicapped height Bathroom Accessibility: Yes How Accessible: Accessible via walker Additional Comments: niece available to help intermittently  Discharge Living Setting Plans for Discharge Living Setting: Patient's home, Other (Comment)(will ahve 24/7 support from family/friends) Type of Home at Discharge: House Discharge Home Layout: Two level, Able to live on main level with bedroom/bathroom Alternate Level Stairs-Rails: Left Alternate Level Stairs-Number of Steps: 13 Discharge Home Access: Stairs to enter Entrance Stairs-Rails: Right Entrance Stairs-Number of Steps: 3 Discharge Bathroom Shower/Tub: Walk-in shower Discharge Bathroom Toilet: Handicapped height Discharge Bathroom Accessibility: Yes How Accessible: Accessible via walker Does the patient have any problems obtaining your medications?: No  Social/Family/Support Systems Patient Roles: Other (Comment)(no children but close family) Contact Information: Niece [name]: [phone]); brother in law Audie Pinto: [phone]); sister Veleta Miners): [phone] Anticipated Caregiver: niece, plus other family members listed above and friends Anticipated Caregiver's Contact Information: see above Ability/Limitations of Caregiver: Min A  Caregiver Availability: 24/7 Discharge Plan Discussed with Primary Caregiver: Yes(with pt and neice) Is Caregiver In Agreement with Plan?: Yes Does Caregiver/Family have Issues with  Lodging/Transportation while Pt is in Rehab?: No  Goals/Additional Needs Patient/Family Goal for Rehab: PT: Supervision; OT: Min A Expected length of stay: 10-14 days Cultural Considerations: NA Dietary Needs: heart healthy, thin liquids; room service with assist. Equipment Needs: TBD Pt/Family Agrees to Admission and willing to participate: Yes Program Orientation Provided & Reviewed with Pt/Caregiver Including Roles  & Responsibilities: Yes(pt and neice)  Barriers to Discharge: Home environment access/layout  Barriers to Discharge Comments: steps to get into house; pt's neice says they are able to get ramp if needed.   Decrease burden of Care through IP rehab admission: NA  Possible need for SNF placement upon discharge:  Not anticipated; pt has good prognosis for further progress through CIR. Pt has very supportive family who have stated they can assist as much as needed. Pt is willing and able to make house accessible with family offering to get ramp as needed and pt is able to live on the first floor of her home.   Patient Condition: I have reviewed medical records from [facility], spoken with RN and MD, and patient and family member. I met with patient at the bedside for inpatient rehabilitation assessment.  Patient will benefit from ongoing PT and OT, can actively participate in 3 hours of therapy a day 5 days of the week, and can make measurable gains during the admission.  Patient will also benefit from the coordinated team approach during an Inpatient Acute Rehabilitation admission.  The patient will receive intensive therapy as well as Rehabilitation physician, nursing, social worker, and care management interventions.  Due to safety, skin/wound care, disease management, medication administration, pain management and patient education the patient requires 24 hour a day rehabilitation nursing.  The patient is currently Mod A for tranfers with no gait attempted at this time  and Supervision to Mod A x2 for basic ADLs.  Discharge setting and therapy post discharge at home with home health is anticipated.  Patient has agreed to participate in the Acute Inpatient Rehabilitation Program and will admit 03/09/2019.  Preadmission Screen Completed By:  Jhonnie Garner, 03/09/2019 11:01 AM ______________________________________________________________________   Discussed status with Dr. Letta Pate on 03/09/2019 at 11:01AM and received approval for admission today.  Admission Coordinator:  Jhonnie Garner, OT, time 11:01AM/Date 03/09/2019  Assessment/Plan: Diagnosis:RIght basal ganglia and corona radiata infarct 1. Does the need for close, 24 hr/day Medical supervision in concert with the patient's rehab needs make it unreasonable for this patient to be served in a less intensive setting? Yes 2. Co-Morbidities requiring supervision/potential complications: HT, DM, CKD 3, CAD 3. Due to bladder management, bowel management, safety, skin/wound care, disease management, medication administration, pain management and patient education, does the patient require 24 hr/day rehab nursing? Yes 4. Does the patient require coordinated care of a physician, rehab nurse, PT (1-2 hrs/day, 5 days/week) and OT (1-2 hrs/day, 5 days/week) to address physical and functional deficits in the context of the above medical diagnosis(es)? Yes Addressing deficits in the following areas: balance, endurance, locomotion, strength, transferring, bowel/bladder control, bathing, dressing, feeding, grooming, toileting and psychosocial support 5. Can the patient actively participate in an intensive therapy program of at least 3 hrs of therapy 5 days a week? Yes 6. The potential for patient to make measurable gains while on inpatient rehab is excellent 7. Anticipated functional outcomes upon discharge from inpatients are: supervision PT, supervision OT, n/a SLP 8. Estimated rehab length of stay to reach the above functional goals  is: 10-14d 9. Anticipated D/C setting: Home 10. Anticipated post D/C treatments: [place] therapy 11. Overall Rehab/Functional Prognosis: excellent  MD Signature: Charlett Blake M.D. [facility] FAAPM&R (Sports Med, Neuromuscular Med) Diplomate Am Board of Electrodiagnostic Med

## 2019-03-09 ENCOUNTER — Encounter (HOSPITAL_COMMUNITY): Payer: Self-pay | Admitting: *Deleted

## 2019-03-09 ENCOUNTER — Inpatient Hospital Stay (HOSPITAL_COMMUNITY)
Admission: RE | Admit: 2019-03-09 | Discharge: 2019-04-01 | DRG: 057 | Disposition: A | Discharge status: Home Health | Payer: Medicare Other | Source: Intra-hospital | Attending: Physical Medicine & Rehabilitation | Admitting: Physical Medicine & Rehabilitation

## 2019-03-09 DIAGNOSIS — D62 Acute posthemorrhagic anemia: Secondary | ICD-10-CM

## 2019-03-09 DIAGNOSIS — I69354 Hemiplegia and hemiparesis following cerebral infarction affecting left non-dominant side: Secondary | ICD-10-CM

## 2019-03-09 DIAGNOSIS — I6381 Other cerebral infarction due to occlusion or stenosis of small artery: Secondary | ICD-10-CM

## 2019-03-09 DIAGNOSIS — G4733 Obstructive sleep apnea (adult) (pediatric): Secondary | ICD-10-CM | POA: Diagnosis present

## 2019-03-09 DIAGNOSIS — Z79890 Hormone replacement therapy (postmenopausal): Secondary | ICD-10-CM

## 2019-03-09 DIAGNOSIS — Z8673 Personal history of transient ischemic attack (TIA), and cerebral infarction without residual deficits: Secondary | ICD-10-CM | POA: Diagnosis present

## 2019-03-09 DIAGNOSIS — H903 Sensorineural hearing loss, bilateral: Secondary | ICD-10-CM | POA: Diagnosis present

## 2019-03-09 DIAGNOSIS — E162 Hypoglycemia, unspecified: Secondary | ICD-10-CM | POA: Diagnosis not present

## 2019-03-09 DIAGNOSIS — Z803 Family history of malignant neoplasm of breast: Secondary | ICD-10-CM

## 2019-03-09 DIAGNOSIS — Z9012 Acquired absence of left breast and nipple: Secondary | ICD-10-CM

## 2019-03-09 DIAGNOSIS — M1990 Unspecified osteoarthritis, unspecified site: Secondary | ICD-10-CM | POA: Diagnosis present

## 2019-03-09 DIAGNOSIS — Z8261 Family history of arthritis: Secondary | ICD-10-CM

## 2019-03-09 DIAGNOSIS — E11649 Type 2 diabetes mellitus with hypoglycemia without coma: Secondary | ICD-10-CM | POA: Diagnosis not present

## 2019-03-09 DIAGNOSIS — Z853 Personal history of malignant neoplasm of breast: Secondary | ICD-10-CM

## 2019-03-09 DIAGNOSIS — Z794 Long term (current) use of insulin: Secondary | ICD-10-CM

## 2019-03-09 DIAGNOSIS — I639 Cerebral infarction, unspecified: Secondary | ICD-10-CM

## 2019-03-09 DIAGNOSIS — N183 Chronic kidney disease, stage 3 unspecified: Secondary | ICD-10-CM | POA: Diagnosis present

## 2019-03-09 DIAGNOSIS — Z808 Family history of malignant neoplasm of other organs or systems: Secondary | ICD-10-CM

## 2019-03-09 DIAGNOSIS — Z800 Family history of malignant neoplasm of digestive organs: Secondary | ICD-10-CM

## 2019-03-09 DIAGNOSIS — I2510 Atherosclerotic heart disease of native coronary artery without angina pectoris: Secondary | ICD-10-CM | POA: Diagnosis present

## 2019-03-09 DIAGNOSIS — I69322 Dysarthria following cerebral infarction: Secondary | ICD-10-CM

## 2019-03-09 DIAGNOSIS — Z888 Allergy status to other drugs, medicaments and biological substances status: Secondary | ICD-10-CM

## 2019-03-09 DIAGNOSIS — Z886 Allergy status to analgesic agent status: Secondary | ICD-10-CM

## 2019-03-09 DIAGNOSIS — Z8349 Family history of other endocrine, nutritional and metabolic diseases: Secondary | ICD-10-CM

## 2019-03-09 DIAGNOSIS — R7309 Other abnormal glucose: Secondary | ICD-10-CM

## 2019-03-09 DIAGNOSIS — I169 Hypertensive crisis, unspecified: Secondary | ICD-10-CM

## 2019-03-09 DIAGNOSIS — K219 Gastro-esophageal reflux disease without esophagitis: Secondary | ICD-10-CM | POA: Diagnosis present

## 2019-03-09 DIAGNOSIS — I129 Hypertensive chronic kidney disease with stage 1 through stage 4 chronic kidney disease, or unspecified chronic kidney disease: Secondary | ICD-10-CM | POA: Diagnosis present

## 2019-03-09 DIAGNOSIS — E1165 Type 2 diabetes mellitus with hyperglycemia: Secondary | ICD-10-CM | POA: Diagnosis not present

## 2019-03-09 DIAGNOSIS — Z833 Family history of diabetes mellitus: Secondary | ICD-10-CM

## 2019-03-09 DIAGNOSIS — N2581 Secondary hyperparathyroidism of renal origin: Secondary | ICD-10-CM | POA: Diagnosis present

## 2019-03-09 DIAGNOSIS — M8580 Other specified disorders of bone density and structure, unspecified site: Secondary | ICD-10-CM | POA: Diagnosis present

## 2019-03-09 DIAGNOSIS — Z806 Family history of leukemia: Secondary | ICD-10-CM

## 2019-03-09 DIAGNOSIS — Z8249 Family history of ischemic heart disease and other diseases of the circulatory system: Secondary | ICD-10-CM

## 2019-03-09 DIAGNOSIS — E785 Hyperlipidemia, unspecified: Secondary | ICD-10-CM | POA: Diagnosis present

## 2019-03-09 DIAGNOSIS — E1142 Type 2 diabetes mellitus with diabetic polyneuropathy: Secondary | ICD-10-CM | POA: Diagnosis present

## 2019-03-09 DIAGNOSIS — I10 Essential (primary) hypertension: Secondary | ICD-10-CM | POA: Diagnosis not present

## 2019-03-09 DIAGNOSIS — E039 Hypothyroidism, unspecified: Secondary | ICD-10-CM | POA: Diagnosis present

## 2019-03-09 DIAGNOSIS — K5900 Constipation, unspecified: Secondary | ICD-10-CM | POA: Diagnosis not present

## 2019-03-09 DIAGNOSIS — Z823 Family history of stroke: Secondary | ICD-10-CM | POA: Diagnosis not present

## 2019-03-09 DIAGNOSIS — Z8041 Family history of malignant neoplasm of ovary: Secondary | ICD-10-CM

## 2019-03-09 DIAGNOSIS — E1122 Type 2 diabetes mellitus with diabetic chronic kidney disease: Secondary | ICD-10-CM | POA: Diagnosis present

## 2019-03-09 LAB — CBC
HCT: 35.1 % — ABNORMAL LOW (ref 36.0–46.0)
Hemoglobin: 11.8 g/dL — ABNORMAL LOW (ref 12.0–15.0)
MCH: 29.4 pg (ref 26.0–34.0)
MCHC: 33.6 g/dL (ref 30.0–36.0)
MCV: 87.5 fL (ref 80.0–100.0)
Platelets: 162 10*3/uL (ref 150–400)
RBC: 4.01 MIL/uL (ref 3.87–5.11)
RDW: 13.3 % (ref 11.5–15.5)
WBC: 7.9 10*3/uL (ref 4.0–10.5)
nRBC: 0 % (ref 0.0–0.2)

## 2019-03-09 LAB — HEMOGLOBIN A1C
Hgb A1c MFr Bld: 6.8 % — ABNORMAL HIGH (ref 4.8–5.6)
Mean Plasma Glucose: 148 mg/dL

## 2019-03-09 LAB — GLUCOSE, CAPILLARY
Glucose-Capillary: 237 mg/dL — ABNORMAL HIGH (ref 70–99)
Glucose-Capillary: 193 mg/dL — ABNORMAL HIGH (ref 70–99)
Glucose-Capillary: 285 mg/dL — ABNORMAL HIGH (ref 70–99)
Glucose-Capillary: 335 mg/dL — ABNORMAL HIGH (ref 70–99)

## 2019-03-09 LAB — CREATININE, SERUM
Creatinine, Ser: 1.52 mg/dL — ABNORMAL HIGH (ref 0.44–1.00)
GFR calc Af Amer: 38 mL/min — ABNORMAL LOW (ref >60)
GFR calc non Af Amer: 33 mL/min — ABNORMAL LOW (ref >60)

## 2019-03-09 MED ORDER — ENOXAPARIN SODIUM 40 MG/0.4ML ~~LOC~~ SOLN: 40.0000 mg | SUBCUTANEOUS | Status: DC

## 2019-03-09 MED ORDER — ACETAMINOPHEN 160 MG/5ML PO SOLN: 650.0000 mg | ORAL | Status: DC | PRN

## 2019-03-09 MED ORDER — MOMETASONE FURO-FORMOTEROL FUM 200-5 MCG/ACT IN AERO
2.0000 | INHALATION_SPRAY | Freq: Two times a day (BID) | RESPIRATORY_TRACT | Status: DC
  Administered 2019-03-09 – 2019-04-01 (×43): 2 via RESPIRATORY_TRACT
  Filled 2019-03-09: qty 8.8

## 2019-03-09 MED ORDER — ENOXAPARIN SODIUM 40 MG/0.4ML ~~LOC~~ SOLN
40.0000 mg | SUBCUTANEOUS | Status: DC
  Administered 2019-03-10 – 2019-03-31 (×22): 40 mg via SUBCUTANEOUS
  Filled 2019-03-09 (×22): qty 0.4

## 2019-03-09 MED ORDER — PANTOPRAZOLE SODIUM 40 MG PO TBEC
40.0000 mg | DELAYED_RELEASE_TABLET | Freq: Every day | ORAL | Status: DC
  Administered 2019-03-10 – 2019-04-01 (×23): 40 mg via ORAL
  Filled 2019-03-09 (×23): qty 1

## 2019-03-09 MED ORDER — INSULIN GLARGINE 100 UNIT/ML ~~LOC~~ SOLN
40.0000 [IU] | Freq: Every day | SUBCUTANEOUS | Status: DC
  Administered 2019-03-09 – 2019-03-10 (×2): 40 [IU] via SUBCUTANEOUS
  Filled 2019-03-09 (×3): qty 0.4

## 2019-03-09 MED ORDER — INSULIN ASPART 100 UNIT/ML ~~LOC~~ SOLN
0.0000 [IU] | Freq: Three times a day (TID) | SUBCUTANEOUS | Status: DC
  Administered 2019-03-10: 3 [IU] via SUBCUTANEOUS
  Administered 2019-03-10: 18:00:00 5 [IU] via SUBCUTANEOUS
  Administered 2019-03-10: 2 [IU] via SUBCUTANEOUS
  Administered 2019-03-11: 3 [IU] via SUBCUTANEOUS
  Administered 2019-03-11: 8 [IU] via SUBCUTANEOUS
  Administered 2019-03-11: 5 [IU] via SUBCUTANEOUS
  Administered 2019-03-12: 10:00:00 2 [IU] via SUBCUTANEOUS
  Administered 2019-03-12: 11 [IU] via SUBCUTANEOUS
  Administered 2019-03-12 – 2019-03-13 (×2): 3 [IU] via SUBCUTANEOUS
  Administered 2019-03-13: 8 [IU] via SUBCUTANEOUS
  Administered 2019-03-13: 3 [IU] via SUBCUTANEOUS
  Administered 2019-03-14: 2 [IU] via SUBCUTANEOUS
  Administered 2019-03-14: 3 [IU] via SUBCUTANEOUS
  Administered 2019-03-14: 2 [IU] via SUBCUTANEOUS
  Administered 2019-03-15 – 2019-03-16 (×2): 3 [IU] via SUBCUTANEOUS
  Administered 2019-03-16 – 2019-03-17 (×4): 2 [IU] via SUBCUTANEOUS
  Administered 2019-03-18: 5 [IU] via SUBCUTANEOUS
  Administered 2019-03-18: 2 [IU] via SUBCUTANEOUS
  Administered 2019-03-19: 5 [IU] via SUBCUTANEOUS
  Administered 2019-03-19: 2 [IU] via SUBCUTANEOUS
  Administered 2019-03-20: 5 [IU] via SUBCUTANEOUS
  Administered 2019-03-21 – 2019-03-24 (×6): 2 [IU] via SUBCUTANEOUS
  Administered 2019-03-24: 18:00:00 3 [IU] via SUBCUTANEOUS
  Administered 2019-03-24: 08:00:00 2 [IU] via SUBCUTANEOUS
  Administered 2019-03-25: 3 [IU] via SUBCUTANEOUS
  Administered 2019-03-26: 8 [IU] via SUBCUTANEOUS
  Administered 2019-03-26: 08:00:00 3 [IU] via SUBCUTANEOUS
  Administered 2019-03-27: 17:00:00 8 [IU] via SUBCUTANEOUS
  Administered 2019-03-28: 18:00:00 3 [IU] via SUBCUTANEOUS
  Administered 2019-03-29: 2 [IU] via SUBCUTANEOUS
  Administered 2019-03-29: 19:00:00 3 [IU] via SUBCUTANEOUS
  Administered 2019-03-30: 2 [IU] via SUBCUTANEOUS
  Administered 2019-03-30: 19:00:00 3 [IU] via SUBCUTANEOUS
  Administered 2019-03-31: 8 [IU] via SUBCUTANEOUS
  Administered 2019-03-31 – 2019-04-01 (×2): 2 [IU] via SUBCUTANEOUS

## 2019-03-09 MED ORDER — ACETAMINOPHEN 650 MG RE SUPP: 650.0000 mg | RECTAL | Status: DC | PRN

## 2019-03-09 MED ORDER — ASPIRIN EC 81 MG PO TBEC
81.0000 mg | DELAYED_RELEASE_TABLET | Freq: Every day | ORAL | Status: DC
  Administered 2019-03-10 – 2019-04-01 (×23): 81 mg via ORAL
  Filled 2019-03-09 (×23): qty 1

## 2019-03-09 MED ORDER — CLOPIDOGREL BISULFATE 75 MG PO TABS: 75.0000 mg | ORAL_TABLET | Freq: Every day | ORAL | 0 refills | Status: DC

## 2019-03-09 MED ORDER — ASPIRIN 81 MG PO TBEC: 81.0000 mg | DELAYED_RELEASE_TABLET | Freq: Every day | ORAL | 2 refills | Status: AC

## 2019-03-09 MED ORDER — CLOPIDOGREL BISULFATE 75 MG PO TABS
75.0000 mg | ORAL_TABLET | Freq: Every day | ORAL | Status: DC
  Administered 2019-03-10 – 2019-04-01 (×23): 75 mg via ORAL
  Filled 2019-03-09 (×23): qty 1

## 2019-03-09 MED ORDER — ACETAMINOPHEN 325 MG PO TABS
650.0000 mg | ORAL_TABLET | ORAL | Status: DC | PRN
  Administered 2019-03-12 – 2019-04-01 (×51): 650 mg via ORAL
  Filled 2019-03-09 (×50): qty 2

## 2019-03-09 MED ORDER — ALBUTEROL SULFATE (2.5 MG/3ML) 0.083% IN NEBU: 2.5000 mg | INHALATION_SOLUTION | Freq: Four times a day (QID) | RESPIRATORY_TRACT | Status: DC | PRN

## 2019-03-09 MED ORDER — LEVOTHYROXINE SODIUM 25 MCG PO TABS
25.0000 ug | ORAL_TABLET | ORAL | Status: DC
  Administered 2019-03-10 – 2019-03-31 (×13): 25 ug via ORAL
  Filled 2019-03-09 (×13): qty 1

## 2019-03-09 MED ORDER — LEVOTHYROXINE SODIUM 50 MCG PO TABS
50.0000 ug | ORAL_TABLET | ORAL | Status: DC
  Administered 2019-03-11 – 2019-04-01 (×10): 50 ug via ORAL
  Filled 2019-03-09 (×11): qty 1

## 2019-03-09 NOTE — Progress Notes (Signed)
Julia Blake, MD  Physician  Physical Medicine and Rehabilitation  PMR Pre-admission  Signed  Date of Service:  03/08/2019 6:26 PM      Related encounter: ED to Hosp-Admission (Discharged) from 03/07/2019 in [facility]      Signed         PMR Admission Coordinator Pre-Admission Assessment  Patient: Julia Banks is an 78 y.o., female MRN: [id number] DOB: June 09, 1941 Height:   Weight:    Insurance Information HMO:     PPO:      PCP:      IPA:     80/20: yes     OTHER:  PRIMARY: Medicare Part A and B      Policy#: [id number]      Subscriber: Patient CM Name:       Phone#:      Fax#:  Pre-Cert#:       Employer:  Benefits:  Phone #: NA     Name: verified eligibility on 9/8 via [employer] Eff. Date: Part A and B effective 01/29/08     Deduct: $1,408      Out of Pocket Max: NA      Life Max: NA CIR: Covered per Medicare guidelines once yearly deductible has been met      SNF: days 1-20, 100%, days 21-100, 80% Outpatient: 80%     Co-Pay: 20% Home Health: 100%      Co-Pay:  DME: 80%     Co-Pay: 20% Providers: Pt's choice SECONDARY: [employer]      Policy#: [id number]      Subscriber: Patient CM Name:       Phone#:      Fax#:  Pre-Cert#:       Employer:  Benefits:  Phone #:  [phone]    Name:  Eff. Date:      Deduct:       Out of Pocket Max:       Life Max:  CIR:       SNF:  Outpatient:      Co-Pay:  Home Health:       Co-Pay:  DME:      Co-Pay:   Medicaid Application Date:       Case Manager:  Disability Application Date:      Case Worker:   The "Data Collection Information Summary" for patients in Inpatient Rehabilitation Facilities with attached "Privacy Act [place] Records" was provided and verbally reviewed with: Patient  Emergency Contact Information         Contact Information    Name Relation Home Work Mobile   Flinchum,Michelle Niece [phone]        Current Medical History  Patient Admitting  Diagnosis: Right basal ganglia and corona radiata infarct  History of Present Illness: Julia Diefendorf Entrekinis a 78 year old right-handed female with history of hypertension, hyperlipidemia, diabetes mellitus, sensorineural hearing loss, CAD, CKD stage III.Pt presented 03/07/2019 with left side weakness as well as mild dysarthria. Cranial CT scan negative. Patient did not receive TPA. CT angiogram of head and neck with no emergent findings. MRI showed acute perforator infarct at the right basal ganglia and corona radiata. Echocardiogram with ejection fraction of 20% normal systolic function. Neurology follow-up maintained on aspirin and Plavix for CVA prophylaxis x3 weeks then aspirin alone. Subcutaneous Lovenox for DVT prophylaxis. Plan for 30-day cardiac event monitor. Tolerating a regular diet. Speech therapy has signed off. Therapy evaluations completed with CIR recommended and patient is to be admitted for  a comprehensive rehab program on 03/09/2019.   Complete NIHSS TOTAL: 6  Patient's medical record from [facility] has been reviewed by the rehabilitation admission coordinator and physician.  Past Medical History      Past Medical History:  Diagnosis Date  . Arthritis   . Asthma   . Breast cancer, left breast ([place]) 02/2014   DCIS, ER+ PR- initially rec bilat mastectomy with sentinal LN biopsy L axilla but now planning just L mastectomy [name])  . Chronic kidney disease, stage 3 ([place])   . Colon polyps    benign  . Complication of anesthesia    hard to wake up  . Coronary artery disease    30% stenosis reported on previous cardiac catheterization in 2006. This was done in [facility]  . GERD (gastroesophageal reflux disease)   . Headaches, cluster   . Hyperlipidemia   . Hypertension   . Hypothyroidism   . Osteopenia 07/2014   DEXA 0.5 spine, 01.7 R femoral neck  . Pneumonia    years ago  . Secondary hyperparathyroidism  of renal origin ([place])    likely  . Sensorineural hearing loss (SNHL) of both ears 01/27/2016   Audiology eval - moderately severe sensorineural hearing loss bilaterally rec amplification (07/2016 - [name] audiologist)  . Sleep apnea    on CPAP  . Thyroid disease   . Type 2 diabetes, uncontrolled, with neuropathy (HCC)    followed by endo Dr Buddy Duty    Family History   family history includes Arthritis in her father and mother; Breast cancer in an other family member; CAD in her father; Colon cancer in her maternal uncle; Diabetes in her mother; Hyperlipidemia in her father and mother; Hypertension in her father and mother; Leukemia in her brother; Melanoma (age of onset: 22) in an other family member; Other in her mother; Ovarian cancer in an other family member; Ovarian cancer (age of onset: 43) in her maternal grandmother; Stroke in her mother.  Prior Rehab/Hospitalizations Has the patient had prior rehab or hospitalizations prior to admission? No  Has the patient had major surgery during 100 days prior to admission? No              Current Medications  Current Facility-Administered Medications:  .  acetaminophen (TYLENOL) tablet 650 mg, 650 mg, Oral, Q4H PRN **OR** acetaminophen (TYLENOL) solution 650 mg, 650 mg, Per Tube, Q4H PRN **OR** acetaminophen (TYLENOL) suppository 650 mg, 650 mg, Rectal, Q4H PRN, Pahwani, Rinka R, MD .  albuterol (PROVENTIL) (2.5 MG/3ML) 0.083% nebulizer solution 2.5 mg, 2.5 mg, Nebulization, Q6H PRN, Pahwani, Rinka R, MD .  aspirin EC tablet 81 mg, 81 mg, Oral, Daily, Rosalin Hawking, MD, 81 mg at 03/09/19 1045 .  clopidogrel (PLAVIX) tablet 75 mg, 75 mg, Oral, Daily, Rosalin Hawking, MD, 75 mg at 03/09/19 1045 .  enoxaparin (LOVENOX) injection 40 mg, 40 mg, Subcutaneous, Q24H, Pahwani, Rinka R, MD, 40 mg at 03/08/19 1338 .  insulin aspart (novoLOG) injection 0-15 Units, 0-15 Units, Subcutaneous, TID WC, Pahwani, Rinka R, MD, 3 Units at  03/09/19 0730 .  insulin aspart (novoLOG) injection 0-5 Units, 0-5 Units, Subcutaneous, QHS, Pahwani, Rinka R, MD, 3 Units at 03/08/19 2209 .  insulin glargine (LANTUS) injection 40 Units, 40 Units, Subcutaneous, Q2200, Debbe Odea, MD, 40 Units at 03/08/19 2211 .  [START ON 03/10/2019] levothyroxine (SYNTHROID) tablet 25 mcg, 25 mcg, Oral, Once per day on Sun Tue Thu Sat, Debbe Odea, MD .  levothyroxine (SYNTHROID) tablet 50 mcg, 50 mcg, Oral, Once per day on Mon Wed Fri, Rizwan, Eunice Blase, MD, 50 mcg at 03/09/19 7062 .  mometasone-formoterol (DULERA) 200-5 MCG/ACT inhaler 2 puff, 2 puff, Inhalation, BID, Pahwani, Rinka R, MD, 2 puff at 03/09/19 0818 .  pantoprazole (PROTONIX) EC tablet 40 mg, 40 mg, Oral, Daily, Rizwan, Saima, MD, 40 mg at 03/09/19 1045  Patients Current Diet:     Diet Order                  Diet - low sodium heart healthy         Diet Heart Room service appropriate? Yes with Assist; Fluid consistency: Thin  Diet effective now               Precautions / Restrictions Precautions Precautions: Fall Restrictions Weight Bearing Restrictions: No   Has the patient had 2 or more falls or a fall with injury in the past year? No  Prior Activity Level Community (5-7x/wk): very active PTA, mowed lawn, went outside, did crafts. Independent PTA  Prior Functional Level Self Care: Did the patient need help bathing, dressing, using the toilet or eating? Independent  Indoor Mobility: Did the patient need assistance with walking from room to room (with or without device)? Independent  Stairs: Did the patient need assistance with internal or external stairs (with or without device)? Independent  Functional Cognition: Did the patient need help planning regular tasks such as shopping or remembering to take medications? Independent  Home Assistive Devices / Equipment Home Equipment: Cane - single point  Prior Device Use: Indicate devices/aids used by the  patient prior to current illness, exacerbation or injury? None of the above  Current Functional Level Cognition  Arousal/Alertness: Awake/alert Overall Cognitive Status: Within Functional Limits for tasks assessed Orientation Level: Oriented X4 Attention: Sustained, Focused Focused Attention: Appears intact(Vigilance WNL: 1/1) Sustained Attention: Appears intact(Serial 7s: refused: Serial 5s: 3/3) Memory: Impaired Memory Impairment: Storage deficit, Retrieval deficit, Decreased recall of new information(Immediate: 5/5; delayed: 3/5; with cues: 2/2) Awareness: Appears intact Problem Solving: Appears intact Executive Function: Reasoning, Sequencing, Organizing Reasoning: Appears intact(Abstraction: 2/2) Sequencing: Appears intact(Clock drawing: 3/3) Organizing: Appears intact(Backaward digit span: 1/1)    Extremity Assessment (includes Sensation/Coordination)  Upper Extremity Assessment: Defer to OT evaluation LUE Deficits / Details: 1/5 in triceps and grip strength; otherwise nearly flaccid LUE Sensation: decreased light touch LUE Coordination: decreased fine motor, decreased gross motor  Lower Extremity Assessment: LLE deficits/detail LLE Deficits / Details: hip flex 1/5, knee ext 2+/5 but with poor control, 1/5 df LLE Sensation: decreased light touch, decreased proprioception LLE Coordination: decreased gross motor    ADLs  Overall ADL's : Needs assistance/impaired Eating/Feeding: Supervision/ safety, Set up(RUE only) Grooming: Set up, Supervision/safety, Sitting Grooming Details (indicate cue type and reason): seated in supported chair Upper Body Bathing: Minimal assistance, Sitting Lower Body Bathing: Maximal assistance, Sitting/lateral leans, Sit to/from stand Upper Body Dressing : Minimal assistance, Sitting Lower Body Dressing: Maximal assistance, Sitting/lateral leans, Sit to/from stand Toilet Transfer: Moderate assistance, +2 for physical assistance, +2 for  safety/equipment, Cueing for safety, BSC(using stedy) Toileting- Clothing Manipulation and Hygiene: Moderate assistance, +2 for physical assistance, +2 for safety/equipment, Sitting/lateral lean, Sit to/from stand Functional mobility during ADLs: Moderate assistance, Maximal assistance, +2 for physical assistance, +2 for safety/equipment, Cueing for safety General ADL Comments: Stedy for transfer; minA overall for UB and MaxA for LB ADL    Mobility  Overal bed mobility: Needs Assistance Bed Mobility: Sit to  Supine Supine to sit: Mod assist, HOB elevated(trunk elevation, RUE with rail use; RLE under LLE to sit EOB) Sit to supine: Mod assist General bed mobility comments: mod A to LLE, pt with decreased ability to control lowering of trunk to bed    Transfers  Overall transfer level: Needs assistance Equipment used: None Transfer via Lift Equipment: Stedy Transfers: Sit to/from Stand, [employer] Transfers Sit to Stand: Mod assist Stand pivot transfers: Mod assist General transfer comment: mod A for sit<>stand for power up and controlling L knee, mod A for SPT to bed from recliner. Pt with good, strong use of R side    Ambulation / Gait / Stairs / Wheelchair Mobility  Ambulation/Gait General Gait Details: unable at this point    Posture / Balance Dynamic Sitting Balance Sitting balance - Comments: intermittient posterior LOB with min A to correct Balance Overall balance assessment: Needs assistance Sitting-balance support: Single extremity supported, Feet supported Sitting balance-Leahy Scale: Poor Sitting balance - Comments: intermittient posterior LOB with min A to correct Postural control: Posterior lean Standing balance support: Bilateral upper extremity supported Standing balance-Leahy Scale: Poor Standing balance comment: requires mod A and assist at L knee to stand    Special needs/care consideration BiPAP/CPAP : has CPAP at home but reports she does not use  regularily CPM : no Continuous Drip IV : no Dialysis : no     Days : no Life Vest : no Oxygen : no, RA Special Bed : no Trach Size : no Wound Vac (area) : no      Location : no Skin : pt has history of mastectomy of Left breast, no further areas of concern documented.         Bowel mgmt: continent, last BM 03/06/2019 per chart Bladder mgmt: external catheter in place, continent per pt report Diabetic mgmt: yes Behavioral consideration : no Chemo/radiation: no   Previous Home Environment (from acute therapy documentation) Living Arrangements: Alone  Lives With: Alone Available Help at Discharge: Family, Available 24 hours/day Type of Home: House Home Layout: Two level, Able to live on main level with bedroom/bathroom Home Access: Stairs to enter Entrance Stairs-Rails: Left Entrance Stairs-Number of Steps: 4 Bathroom Shower/Tub: Multimedia programmer: Handicapped height Bathroom Accessibility: Yes How Accessible: Accessible via walker Additional Comments: niece available to help intermittently  Discharge Living Setting Plans for Discharge Living Setting: Patient's home, Other (Comment)(will ahve 24/7 support from family/friends) Type of Home at Discharge: House Discharge Home Layout: Two level, Able to live on main level with bedroom/bathroom Alternate Level Stairs-Rails: Left Alternate Level Stairs-Number of Steps: 13 Discharge Home Access: Stairs to enter Entrance Stairs-Rails: Right Entrance Stairs-Number of Steps: 3 Discharge Bathroom Shower/Tub: Walk-in shower Discharge Bathroom Toilet: Handicapped height Discharge Bathroom Accessibility: Yes How Accessible: Accessible via walker Does the patient have any problems obtaining your medications?: No  Social/Family/Support Systems Patient Roles: Other (Comment)(no children but close family) Contact Information: Niece [name]: [phone]); brother in law Audie Pinto: [phone]); sister Veleta Miners):  [phone] Anticipated Caregiver: niece, plus other family members listed above and friends Anticipated Caregiver's Contact Information: see above Ability/Limitations of Caregiver: Min A  Caregiver Availability: 24/7 Discharge Plan Discussed with Primary Caregiver: Yes(with pt and neice) Is Caregiver In Agreement with Plan?: Yes Does Caregiver/Family have Issues with Lodging/Transportation while Pt is in Rehab?: No  Goals/Additional Needs Patient/Family Goal for Rehab: PT: Supervision; OT: Min A Expected length of stay: 10-14 days Cultural Considerations: NA Dietary Needs: heart healthy, thin liquids; room  service with assist. Equipment Needs: TBD Pt/Family Agrees to Admission and willing to participate: Yes Program Orientation Provided & Reviewed with Pt/Caregiver Including Roles  & Responsibilities: Yes(pt and neice)  Barriers to Discharge: Home environment access/layout  Barriers to Discharge Comments: steps to get into house; pt's neice says they are able to get ramp if needed.   Decrease burden of Care through IP rehab admission: NA  Possible need for SNF placement upon discharge: Not anticipated; pt has good prognosis for further progress through CIR. Pt has very supportive family who have stated they can assist as much as needed. Pt is willing and able to make house accessible with family offering to get ramp as needed and pt is able to live on the first floor of her home.   Patient Condition: I have reviewed medical records from [facility], spoken with RN and MD, and patient and family member. I met with patient at the bedside for inpatient rehabilitation assessment.  Patient will benefit from ongoing PT and OT, can actively participate in 3 hours of therapy a day 5 days of the week, and can make measurable gains during the admission.  Patient will also benefit from the coordinated team approach during an Inpatient Acute Rehabilitation admission.  The patient  will receive intensive therapy as well as Rehabilitation physician, nursing, social worker, and care management interventions.  Due to safety, skin/wound care, disease management, medication administration, pain management and patient education the patient requires 24 hour a day rehabilitation nursing.  The patient is currently Mod A for tranfers with no gait attempted at this time and Supervision to Mod A x2 for basic ADLs.  Discharge setting and therapy post discharge at home with home health is anticipated.  Patient has agreed to participate in the Acute Inpatient Rehabilitation Program and will admit 03/09/2019.  Preadmission Screen Completed By:  Jhonnie Garner, 03/09/2019 11:01 AM ______________________________________________________________________   Discussed status with Dr. Letta Pate on 03/09/2019 at 11:01AM and received approval for admission today.  Admission Coordinator:  Jhonnie Garner, OT, time 11:01AM/Date 03/09/2019  Assessment/Plan: Diagnosis:RIght basal ganglia and corona radiata infarct 1. Does the need for close, 24 hr/day Medical supervision in concert with the patient's rehab needs make it unreasonable for this patient to be served in a less intensive setting? Yes 2. Co-Morbidities requiring supervision/potential complications: HT, DM, CKD 3, CAD 3. Due to bladder management, bowel management, safety, skin/wound care, disease management, medication administration, pain management and patient education, does the patient require 24 hr/day rehab nursing? Yes 4. Does the patient require coordinated care of a physician, rehab nurse, PT (1-2 hrs/day, 5 days/week) and OT (1-2 hrs/day, 5 days/week) to address physical and functional deficits in the context of the above medical diagnosis(es)? Yes Addressing deficits in the following areas: balance, endurance, locomotion, strength, transferring, bowel/bladder control, bathing, dressing, feeding, grooming, toileting and psychosocial support 5. Can  the patient actively participate in an intensive therapy program of at least 3 hrs of therapy 5 days a week? Yes 6. The potential for patient to make measurable gains while on inpatient rehab is excellent 7. Anticipated functional outcomes upon discharge from inpatients are: supervision PT, supervision OT, n/a SLP 8. Estimated rehab length of stay to reach the above functional goals is: 10-14d 9. Anticipated D/C setting: Home 10. Anticipated post D/C treatments: [facility] therapy 11. Overall Rehab/Functional Prognosis: excellent  MD Signature: Julia Banks M.D. [facility] FAAPM&R (Sports Med, Neuromuscular Med) Diplomate Am Board of Electrodiagnostic Med  Revision History Date/Time User Provider Type Action  03/09/2019 11:32 AM Kirsteins, Luanna Salk, MD Physician Sign  03/09/2019 11:02 AM Jhonnie Garner, OT Rehab Admission Coordinator Share  View Details Report

## 2019-03-09 NOTE — Progress Notes (Signed)
Patient arrived from [facility] via wheelchair, no complaints of pain at this time.

## 2019-03-09 NOTE — H&P (Signed)
Physical Medicine and Rehabilitation Admission H&P    Chief Complaint  Patient presents with  . Cerebrovascular Accident  : HPI: Julia Banks is a 78 year old right-handed female with history of hypertension, hyperlipidemia, diabetes mellitus, sensorineural hearing loss, CAD, CKD stage III.  Per chart review patient lives alone independent prior to admission.  2 level home with bed and bath on main level 4 steps to entry.  Presented 03/07/2019 with left side weakness as well as mild dysarthria.  Cranial CT scan negative.  Patient did not receive TPA.  CT angiogram of head and neck with no emergent findings.  MRI showed acute perforator infarct at the right basal ganglia and corona radiata.  Echocardiogram with ejection fraction of [id number] normal systolic function.  Neurology follow-up maintained on aspirin and Plavix for CVA prophylaxis x3 weeks then aspirin alone.  Subcutaneous Lovenox for DVT prophylaxis.  Plan for 30-day cardiac event monitor.  Tolerating a regular diet.  Speech therapy has signed off.  Therapy evaluations completed and patient was admitted for a comprehensive rehab program.  Review of Systems  Constitutional: Negative for chills and fever.  HENT: Negative for hearing loss.   Eyes: Negative for blurred vision and double vision.  Respiratory: Negative for cough and shortness of breath.   Cardiovascular: Negative for chest pain, palpitations and leg swelling.  Gastrointestinal: Positive for constipation. Negative for heartburn and nausea.       GERD  Genitourinary: Negative for dysuria, flank pain and hematuria.  Musculoskeletal: Positive for myalgias.  Skin: Negative for rash.  Neurological: Positive for sensory change, speech change, focal weakness and headaches.  All other systems reviewed and are negative.  Past Medical History:  Diagnosis Date  . Arthritis   . Asthma   . Breast cancer, left breast ([place]) 02/2014   DCIS, ER+ PR- initially rec bilat mastectomy with  sentinal LN biopsy L axilla but now planning just L mastectomy [name])  . Chronic kidney disease, stage 3 ([place])   . Colon polyps    benign  . Complication of anesthesia    hard to wake up  . Coronary artery disease    30% stenosis reported on previous cardiac catheterization in 2006. This was done in [facility]  . GERD (gastroesophageal reflux disease)   . Headaches, cluster   . Hyperlipidemia   . Hypertension   . Hypothyroidism   . Osteopenia 07/2014   DEXA 0.5 spine, 01.7 R femoral neck  . Pneumonia    years ago  . Secondary hyperparathyroidism of renal origin ([place])    likely  . Sensorineural hearing loss (SNHL) of both ears 01/27/2016   Audiology eval - moderately severe sensorineural hearing loss bilaterally rec amplification (07/2016 - [facility] [name] audiologist)  . Sleep apnea    on CPAP  . Thyroid disease   . Type 2 diabetes, uncontrolled, with neuropathy ([place])    followed by endo Dr Buddy Duty   Past Surgical History:  Procedure Laterality Date  . ABDOMINAL HYSTERECTOMY     heavy bleeding, ovaries remain  . APPENDECTOMY    . BREAST BIOPSY Right    benign  . BREAST BIOPSY Left 02/2014   ?DCIS  . CARDIAC CATHETERIZATION  2006   [place], [place].   . CARDIAC CATHETERIZATION  10/2013   mild 2v nonobstructive disease, EF 60%  . CARDIOVASCULAR STRESS TEST  09/2012   WNL ([place])  . CHOLECYSTECTOMY    . COLONOSCOPY  05/2010   2 TAs, diverticulosis, rpt 2 yrs (  Dr Elissa Hefty in [place])  . COLONOSCOPY  12/2014   1 TA, rpt 5 yrs Henrene Pastor)  . cortisone right knee  01/04/15  . GALLBLADDER SURGERY    . MASTECTOMY W/ SENTINEL NODE BIOPSY Left 06/07/2014   DR WAKEFIELD  . SIMPLE MASTECTOMY WITH AXILLARY SENTINEL NODE BIOPSY Left 06/07/2014   Procedure: LEFT TOTAL MASTECTOMY WITH LEFT AXILLARY SENTINEL NODE BIOPSY;  Surgeon: Rolm Bookbinder, MD;  Location: [facility] OR;  Service: General;  Laterality: Left;   Family History  Problem Relation Age of Onset  .  Arthritis Mother   . Hyperlipidemia Mother   . Stroke Mother   . Hypertension Mother   . Diabetes Mother   . Other Mother        parathyroid adenoma  . Arthritis Father   . Hyperlipidemia Father   . CAD Father        MI  . Hypertension Father   . Breast cancer Other   . Ovarian cancer Other   . Leukemia Brother        dx in his 18s  . Colon cancer Maternal Uncle        dx in his 49s  . Ovarian cancer Maternal Grandmother 90  . Melanoma Other 27       dx 2x - 30 and 29/dx 3x - ages 67, 33, and 43  . Cancer Neg Hx   . Crohn's disease Neg Hx   . Colon polyps Neg Hx   . Ulcerative colitis Neg Hx   . Stomach cancer Neg Hx   . Rectal cancer Neg Hx    Social History:  reports that she has never smoked. She has never used smokeless tobacco. She reports that she does not drink alcohol or use drugs. Allergies:  Allergies  Allergen Reactions  . Altace [Ramipril] Other (See Comments)    Upset stomach  . Aspirin Nausea And Vomiting  . Metformin And Related Other (See Comments)    GI upset   Medications Prior to Admission  Medication Sig Dispense Refill  . albuterol (PROVENTIL HFA;VENTOLIN HFA) 108 (90 BASE) MCG/ACT inhaler Inhale into the lungs every 6 (six) hours as needed for wheezing or shortness of breath.    . Ascorbic Acid (VITAMIN C WITH ROSE HIPS) 500 MG tablet Take 500 mg by mouth daily.    . Cholecalciferol (VITAMIN D PO) Take 5,000 Units by mouth.    . diphenhydrAMINE (BENADRYL) 12.5 MG chewable tablet Chew 12.5 mg by mouth at bedtime as needed for sleep.    Marland Kitchen docusate sodium (COLACE) 100 MG capsule Take 200 mg by mouth daily.     . Fluticasone-Salmeterol (ADVAIR) 250-50 MCG/DOSE AEPB Inhale 1 puff into the lungs 2 (two) times daily.    . furosemide (LASIX) 20 MG tablet Take 1 tablet (20 mg total) by mouth every other day. (Patient taking differently: Take 20 mg by mouth daily. ) 45 tablet 3  . Insulin Glargine (LANTUS SOLOSTAR) 100 UNIT/ML Solostar Pen INJECT 56 UNITS  INTO THE SKIN DAILY WITH BREAKFAST (Patient taking differently: Inject 56 Units into the skin daily. ) 51 mL 0  . levothyroxine (SYNTHROID, LEVOTHROID) 25 MCG tablet TAKE 1 TABLET BY MOUTH DAILY. EXCEPT 2 TABLETS ON MON, WED, AND FRI (Patient taking differently: Take 25-50 mcg by mouth See admin instructions. TAKE 1 TABLET BY MOUTH DAILY. EXCEPT 2 TABLETS ON MON, WED, AND FRI) 130 tablet 3  . losartan (COZAAR) 50 MG tablet Take 1 tablet (50 mg total) by mouth daily. [place]  tablet 1  . metoprolol succinate (TOPROL-XL) 50 MG 24 hr tablet Take 1 tablet (50 mg total) by mouth daily. 90 tablet 3  . Omega-3 Fatty Acids (OMEGA 3 PO) Take 1 capsule by mouth daily. 180 EPA/120DHA     . OVER THE COUNTER MEDICATION Calcium magnesium 1000mg  day    . OVER THE COUNTER MEDICATION Women's multivitamin with iron    . pantoprazole (PROTONIX) 40 MG tablet TAKE 1 TABLET BY MOUTH EVERY DAY (Patient taking differently: Take 40 mg by mouth daily. ) 90 tablet 0  . potassium chloride SA (KLOR-CON M20) 20 MEQ tablet Take 1 tablet (20 mEq total) by mouth daily. 90 tablet 3  . pravastatin (PRAVACHOL) 40 MG tablet Take 1 tablet (40 mg total) by mouth daily. 90 tablet 3  . vitamin B-12 (CYANOCOBALAMIN) 1000 MCG tablet Take 1,000 mcg by mouth daily.    . BD PEN NEEDLE NANO U/F 32G X 4 MM MISC USE 1 AS DIRECTED TO INJECT MEDICATION DAILY. DX CODE: E11.40, E11.65 100 each 1    Drug Regimen Review Drug regimen was reviewed and remains appropriate with no significant issues identified  Home: Home Living Family/patient expects to be discharged to:: Private residence Living Arrangements: Alone Available Help at Discharge: Family, Available 24 hours/day Type of Home: House Home Access: Stairs to enter [employer] of Steps: 4 Entrance Stairs-Rails: Left Home Layout: Two level, Able to live on main level with bedroom/bathroom Bathroom Shower/Tub: Multimedia programmer: Handicapped height Bathroom Accessibility:  Yes Home Equipment: [place] - single point Additional Comments: niece available to help intermittently  Lives With: Alone   Functional History: Prior Function Level of Independence: Independent Comments: Driving  Functional Status:  Mobility: Bed Mobility Overal bed mobility: Needs Assistance Bed Mobility: Sit to Supine Supine to sit: Mod assist, HOB elevated(trunk elevation, RUE with rail use; RLE under LLE to sit EOB) Sit to supine: Mod assist General bed mobility comments: mod A to LLE, pt with decreased ability to control lowering of trunk to bed Transfers Overall transfer level: Needs assistance Equipment used: None Transfer via Lift Equipment: Stedy Transfers: Sit to/from Stand, [employer] Transfers Sit to Stand: Mod assist Stand pivot transfers: Mod assist General transfer comment: mod A for sit<>stand for power up and controlling L knee, mod A for SPT to bed from recliner. Pt with good, strong use of R side Ambulation/Gait General Gait Details: unable at this point    ADL: ADL Overall ADL's : Needs assistance/impaired Eating/Feeding: Supervision/ safety, Set up(RUE only) Grooming: Set up, Supervision/safety, Sitting Grooming Details (indicate cue type and reason): seated in supported chair Upper Body Bathing: Minimal assistance, Sitting Lower Body Bathing: Maximal assistance, Sitting/lateral leans, Sit to/from stand Upper Body Dressing : Minimal assistance, Sitting Lower Body Dressing: Maximal assistance, Sitting/lateral leans, Sit to/from stand Toilet Transfer: Moderate assistance, +2 for physical assistance, +2 for safety/equipment, Cueing for safety, BSC(using stedy) Toileting- Clothing Manipulation and Hygiene: Moderate assistance, +2 for physical assistance, +2 for safety/equipment, Sitting/lateral lean, Sit to/from stand Functional mobility during ADLs: Moderate assistance, Maximal assistance, +2 for physical assistance, +2 for safety/equipment, Cueing for  safety General ADL Comments: Stedy for transfer; minA overall for UB and MaxA for LB ADL  Cognition: Cognition Overall Cognitive Status: Within Functional Limits for tasks assessed Arousal/Alertness: Awake/alert Orientation Level: Oriented X4 Attention: Sustained, Focused Focused Attention: Appears intact(Vigilance WNL: 1/1) Sustained Attention: Appears intact(Serial 7s: refused: Serial 5s: 3/3) Memory: Impaired Memory Impairment: Storage deficit, Retrieval deficit, Decreased recall of new information(Immediate:  5/5; delayed: 3/5; with cues: 2/2) Awareness: Appears intact Problem Solving: Appears intact Executive Function: Reasoning, Sequencing, Organizing Reasoning: Appears intact(Abstraction: 2/2) Sequencing: Appears intact(Clock drawing: 3/3) Organizing: Appears intact(Backaward digit span: 1/1) Cognition Arousal/Alertness: Awake/alert Behavior During Therapy: WFL for tasks assessed/performed Overall Cognitive Status: Within Functional Limits for tasks assessed  Physical Exam: Blood pressure (!) 166/57, pulse 76, temperature 98.1 F (36.7 C), temperature source Oral, resp. rate (!) 22, SpO2 97 %. Physical Exam  Neurological:  Patient is alert in no acute distress sitting up in bed talking on the phone.  Makes good eye contact with examiner and follows full commands.  Oriented x3.  Fair awareness of deficits.  Patient very cooperative with exam.   General: No acute distress Mood and affect are appropriate Heart: Regular rate and rhythm no rubs murmurs or extra sounds Lungs: Clear to auscultation, breathing unlabored, no rales or wheezes Abdomen: Positive bowel sounds, soft nontender to palpation, nondistended Extremities: No clubbing, cyanosis, or edema Skin: No evidence of breakdown, no evidence of rash Neurologic: Cranial nerves II through XII intact, motor strength is 5/5 in right deltoid, bicep, tricep, grip, hip flexor, knee extensors, ankle dorsiflexor and plantar  flexor 2 minus left deltoid 3- bicep to minus tricep trace finger flexors 3- left hip flexor 4- knee extensor 0 ankle dorsiflexor and plantar flexor Sensory exam normal sensation to light touch  in bilateral upper and lower extremities Cerebellar exam normal finger to nose to finger as well as heel to shin in bilateral upper and lower extremities Musculoskeletal: Full range of motion in all 4 extremities. No joint swelling  Results for orders placed or performed during the hospital encounter of 03/07/19 (from the past 48 hour(s))  CBG monitoring, ED     Status: Abnormal   Collection Time: 03/07/19  9:58 AM  Result Value Ref Range   Glucose-Capillary 119 (H) 70 - 99 mg/dL   Comment 1 Notify RN    Comment 2 Document in Chart   Protime-INR     Status: None   Collection Time: 03/07/19  9:59 AM  Result Value Ref Range   Prothrombin Time 13.1 11.4 - 15.2 seconds   INR 1.0 0.8 - 1.2    Comment: (NOTE) INR goal varies based on device and disease states. Performed at [facility], [place] [address] [place], [place] [address]   APTT     Status: Abnormal   Collection Time: 03/07/19  9:59 AM  Result Value Ref Range   aPTT 41 (H) 24 - 36 seconds    Comment:        IF BASELINE aPTT IS ELEVATED, SUGGEST PATIENT RISK ASSESSMENT BE USED TO DETERMINE APPROPRIATE ANTICOAGULANT THERAPY. Performed at [facility], [place] [address] [place], [place] [address]   CBC     Status: Abnormal   Collection Time: 03/07/19  9:59 AM  Result Value Ref Range   WBC 8.2 4.0 - 10.5 K/uL   RBC 4.79 3.87 - 5.11 MIL/uL   Hemoglobin 14.1 12.0 - 15.0 g/dL   HCT 42.1 36.0 - 46.0 %   MCV 87.9 80.0 - 100.0 fL   MCH 29.4 26.0 - 34.0 pg   MCHC 33.5 30.0 - 36.0 g/dL   RDW 13.2 11.5 - 15.5 %   Platelets 148 (L) 150 - 400 K/uL   nRBC 0.0 0.0 - 0.2 %    Comment: Performed at [facility], [place] [address], [place], [place] [address]  Differential  Status: None   Collection Time: 03/07/19  9:59 AM   Result Value Ref Range   Neutrophils Relative % 61 %   Neutro Abs 5.0 1.7 - 7.7 K/uL   Lymphocytes Relative 27 %   Lymphs Abs 2.3 0.7 - 4.0 K/uL   Monocytes Relative 6 %   Monocytes Absolute 0.5 0.1 - 1.0 K/uL   Eosinophils Relative 5 %   Eosinophils Absolute 0.4 0.0 - 0.5 K/uL   Basophils Relative 1 %   Basophils Absolute 0.0 0.0 - 0.1 K/uL   Immature Granulocytes 0 %   Abs Immature Granulocytes 0.03 0.00 - 0.07 K/uL    Comment: Performed at [facility] [address] [place], [place] [address]  Comprehensive metabolic panel     Status: Abnormal   Collection Time: 03/07/19  9:59 AM  Result Value Ref Range   Sodium 141 135 - 145 mmol/L   Potassium 4.3 3.5 - 5.1 mmol/L   Chloride 108 98 - 111 mmol/L   CO2 24 22 - 32 mmol/L   Glucose, Bld 125 (H) 70 - 99 mg/dL   BUN 16 8 - 23 mg/dL   Creatinine, Ser 1.53 (H) 0.44 - 1.00 mg/dL   Calcium 9.3 8.9 - 10.3 mg/dL   Total Protein 7.0 6.5 - 8.1 g/dL   Albumin 4.1 3.5 - 5.0 g/dL   AST 19 15 - 41 U/L   ALT 15 0 - 44 U/L   Alkaline Phosphatase 92 38 - 126 U/L   Total Bilirubin 1.0 0.3 - 1.2 mg/dL   GFR calc non Af Amer 33 (L) >60 mL/min   GFR calc Af Amer 38 (L) >60 mL/min   Anion gap 9 5 - 15    Comment: Performed at [facility] [address] [place], [place] [address]  I-stat chem 8, ED     Status: Abnormal   Collection Time: 03/07/19 10:03 AM  Result Value Ref Range   Sodium 140 135 - 145 mmol/L   Potassium 4.2 3.5 - 5.1 mmol/L   Chloride 106 98 - 111 mmol/L   BUN 18 8 - 23 mg/dL   Creatinine, Ser 1.40 (H) 0.44 - 1.00 mg/dL   Glucose, Bld 120 (H) 70 - 99 mg/dL   Calcium, Ion 1.14 (L) 1.15 - 1.40 mmol/L   TCO2 22 22 - 32 mmol/L   Hemoglobin 13.9 12.0 - 15.0 g/dL   HCT 41.0 36.0 - 46.0 %  SARS CORONAVIRUS 2 (TAT 6-24 HRS) Nasopharyngeal Nasopharyngeal Swab     Status: None   Collection Time: 03/07/19  3:53 PM   Specimen: Nasopharyngeal Swab  Result Value Ref Range   SARS Coronavirus 2 NEGATIVE NEGATIVE     Comment: (NOTE) SARS-CoV-2 target nucleic acids are NOT DETECTED. The SARS-CoV-2 RNA is generally detectable in upper and lower respiratory specimens during the acute phase of infection. Negative results do not preclude SARS-CoV-2 infection, do not rule out co-infections with other pathogens, and should not be used as the sole basis for treatment or other patient management decisions. Negative results must be combined with clinical observations, patient history, and epidemiological information. The expected result is Negative. Fact Sheet for Patients: SugarRoll.be Fact Sheet for Healthcare Providers: https://www.woods-mathews.com/ This test is not yet approved or cleared by the [place] FDA and  has been authorized for detection and/or diagnosis of SARS-CoV-2 by FDA under an Emergency Use Authorization (EUA). This EUA will remain  in effect (meaning this test can be used) for  the duration of the COVID-19 declaration under Section 56 4(b)(1) of the Act, 21 U.S.C. section 360bbb-3(b)(1), unless the authorization is terminated or revoked sooner. Performed at [facility], [place] [address], [place], [place] [address]   Glucose, capillary     Status: Abnormal   Collection Time: 03/07/19  4:06 PM  Result Value Ref Range   Glucose-Capillary 126 (H) 70 - 99 mg/dL   Comment 1 Notify RN    Comment 2 Document in Chart   Glucose, capillary     Status: Abnormal   Collection Time: 03/07/19 10:04 PM  Result Value Ref Range   Glucose-Capillary 238 (H) 70 - 99 mg/dL  Hemoglobin A1c     Status: Abnormal   Collection Time: 03/08/19  3:27 AM  Result Value Ref Range   Hgb A1c MFr Bld 6.8 (H) 4.8 - 5.6 %    Comment: (NOTE)         Prediabetes: 5.7 - 6.4         Diabetes: >6.4         Glycemic control for adults with diabetes: <7.0    Mean Plasma Glucose 148 mg/dL    Comment: (NOTE) Performed At: [facility] [place], [place] [id number] Rush Farmer MD [id number]   Lipid panel     Status: Abnormal   Collection Time: 03/08/19  3:27 AM  Result Value Ref Range   Cholesterol 207 (H) 0 - 200 mg/dL   Triglycerides 160 (H) <150 mg/dL   HDL 31 (L) >40 mg/dL   Total CHOL/HDL Ratio 6.7 RATIO   VLDL 32 0 - 40 mg/dL   LDL Cholesterol 144 (H) 0 - 99 mg/dL    Comment:        Total Cholesterol/HDL:CHD Risk Coronary Heart Disease Risk Table                     Men   Women  1/2 Average Risk   3.4   3.3  Average Risk       5.0   4.4  2 X Average Risk   9.6   7.1  3 X Average Risk  23.4   11.0        Use the calculated Patient Ratio above and the CHD Risk Table to determine the patient's CHD Risk.        ATP III CLASSIFICATION (LDL):  <100     mg/dL   Optimal  100-129  mg/dL   Near or Above                    Optimal  130-159  mg/dL   Borderline  160-189  mg/dL   High  >190     mg/dL   Very High Performed at [facility] [address], [place],  [address]   Glucose, capillary     Status: Abnormal   Collection Time: 03/08/19  8:52 AM  Result Value Ref Range   Glucose-Capillary 193 (H) 70 - 99 mg/dL  TSH     Status: None   Collection Time: 03/08/19 11:15 AM  Result Value Ref Range   TSH 3.531 0.350 - 4.500 uIU/mL    Comment: Performed by a 3rd Generation assay with a functional sensitivity of <=0.01 uIU/mL. Performed at [facility], [place] [address], [place], [place] [address]   Glucose, capillary     Status: Abnormal   Collection Time: 03/08/19  1:20 PM  Result  Value Ref Range   Glucose-Capillary 288 (H) 70 - 99 mg/dL  Glucose, capillary     Status: Abnormal   Collection Time: 03/08/19  4:11 PM  Result Value Ref Range   Glucose-Capillary 325 (H) 70 - 99 mg/dL  Glucose, capillary     Status: Abnormal   Collection Time: 03/08/19 10:08 PM  Result Value Ref Range   Glucose-Capillary 237 (H) 70 - 99 mg/dL  Glucose, capillary     Status: Abnormal   Collection Time:  03/09/19  6:02 AM  Result Value Ref Range   Glucose-Capillary 193 (H) 70 - 99 mg/dL   Comment 1 Notify RN    Ct Angio Head W Or Wo Contrast  Result Date: 03/07/2019 CLINICAL DATA:  Left-sided weakness EXAM: CT ANGIOGRAPHY HEAD AND NECK TECHNIQUE: Multidetector CT imaging of the head and neck was performed using the standard protocol during bolus administration of intravenous contrast. Multiplanar CT image reconstructions and MIPs were obtained to evaluate the vascular anatomy. Carotid stenosis measurements (when applicable) are obtained utilizing NASCET criteria, using the distal internal carotid diameter as the denominator. CONTRAST:  166mL OMNIPAQUE IOHEXOL 350 MG/ML SOLN COMPARISON:  None. FINDINGS: CTA NECK FINDINGS Aortic arch: Mild atherosclerotic plaque.  Three vessel branching. Right carotid system: Mixed density plaque at the ICA bulb/bifurcation with 25% ICA origin narrowing. Negative for ulceration or beading. Left carotid system: Mild atherosclerotic plaque at the bifurcation. No stenosis or ulceration. Vertebral arteries: Proximal subclavian atherosclerosis without flow limiting stenosis. Strong right vertebral artery dominance. No acquired vertebral stenosis. Skeleton: Spondylosis with bridging cervical and upper thoracic osteophytes. Other neck: Negative Upper chest: Negative Review of the MIP images confirms the above findings CTA HEAD FINDINGS Anterior circulation: Atherosclerotic calcification on the carotid siphons. No branch occlusion or proximal flow limiting stenosis. Negative for aneurysm. Posterior circulation: Strong right vertebral artery dominance. Most left vertebral flow is to the PICA. Basilar is diffusely patent and small in the setting of bilateral fetal type PCA. No branch occlusion. Negative for aneurysm Venous sinuses: Diffusely patent Anatomic variants: As above Review of the MIP images confirms the above findings IMPRESSION: 1. No emergent finding. 2. Atherosclerosis  without flow limiting stenosis in the head and neck. Electronically Signed   By: Monte Fantasia M.D.   On: 03/07/2019 10:54   Ct Angio Neck W Or Wo Contrast  Result Date: 03/07/2019 CLINICAL DATA:  Left-sided weakness EXAM: CT ANGIOGRAPHY HEAD AND NECK TECHNIQUE: Multidetector CT imaging of the head and neck was performed using the standard protocol during bolus administration of intravenous contrast. Multiplanar CT image reconstructions and MIPs were obtained to evaluate the vascular anatomy. Carotid stenosis measurements (when applicable) are obtained utilizing NASCET criteria, using the distal internal carotid diameter as the denominator. CONTRAST:  122mL OMNIPAQUE IOHEXOL 350 MG/ML SOLN COMPARISON:  None. FINDINGS: CTA NECK FINDINGS Aortic arch: Mild atherosclerotic plaque.  Three vessel branching. Right carotid system: Mixed density plaque at the ICA bulb/bifurcation with 25% ICA origin narrowing. Negative for ulceration or beading. Left carotid system: Mild atherosclerotic plaque at the bifurcation. No stenosis or ulceration. Vertebral arteries: Proximal subclavian atherosclerosis without flow limiting stenosis. Strong right vertebral artery dominance. No acquired vertebral stenosis. Skeleton: Spondylosis with bridging cervical and upper thoracic osteophytes. Other neck: Negative Upper chest: Negative Review of the MIP images confirms the above findings CTA HEAD FINDINGS Anterior circulation: Atherosclerotic calcification on the carotid siphons. No branch occlusion or proximal flow limiting stenosis. Negative for aneurysm. Posterior circulation: Strong right vertebral artery dominance. Most left  vertebral flow is to the PICA. Basilar is diffusely patent and small in the setting of bilateral fetal type PCA. No branch occlusion. Negative for aneurysm Venous sinuses: Diffusely patent Anatomic variants: As above Review of the MIP images confirms the above findings IMPRESSION: 1. No emergent finding. 2.  Atherosclerosis without flow limiting stenosis in the head and neck. Electronically Signed   By: Monte Fantasia M.D.   On: 03/07/2019 10:54   Mr Brain Wo Contrast  Result Date: 03/07/2019 CLINICAL DATA:  Left-sided weakness this morning EXAM: MRI HEAD WITHOUT CONTRAST TECHNIQUE: Multiplanar, multiecho pulse sequences of the brain and surrounding structures were obtained without intravenous contrast. COMPARISON:  Head CT and CTA from earlier today FINDINGS: Brain: Acute perforator infarct on the right affecting corona radiata, posterior caudate, and upper putamen. No pre-existing infarct. No hemorrhage, hydrocephalus, collection, or masslike finding Vascular: No change from prior CTA. Skull and upper cervical spine: Negative for marrow lesion. Sinuses/Orbits: Negative IMPRESSION: Acute perforator infarct at the right basal ganglia and corona radiata. Electronically Signed   By: Monte Fantasia M.D.   On: 03/07/2019 14:31   Ct Head Code Stroke Wo Contrast  Result Date: 03/07/2019 CLINICAL DATA:  Code stroke.  Left-sided weakness EXAM: CT HEAD WITHOUT CONTRAST TECHNIQUE: Contiguous axial images were obtained from the base of the skull through the vertex without intravenous contrast. COMPARISON:  None. FINDINGS: Brain: No evidence of acute infarction, hemorrhage, hydrocephalus, extra-axial collection or mass lesion/mass effect. Vascular: No hyperdense vessel.  Atherosclerotic calcification. Skull: Normal. Negative for fracture or focal lesion. Sinuses/Orbits: Essentially clear sinuses. Bone island or other benign process posterior to the right sphenoid sinus. Other: These results were communicated to Dr. Rory Percy at 10:11 amon 9/7/2020by text page via the [facility] messaging system. ASPECTS [facility] Stroke Program Early CT Score) - Ganglionic level infarction (caudate, lentiform nuclei, internal capsule, insula, M1-M3 cortex): 7 - Supraganglionic infarction (M4-M6 cortex): 3 Total score (0-10 with 10 being normal): 10  IMPRESSION: 1. No acute finding. 2. ASPECTS is 10. Electronically Signed   By: Monte Fantasia M.D.   On: 03/07/2019 10:12       Medical Problem List and Plan: 1.  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  Recommendations for 30-day cardiac event monitor Initiate rehab eval's PT OT in a.m. 2.  Antithrombotics: -DVT/anticoagulation: Subcutaneous Lovenox  -antiplatelet therapy: Aspirin 81 mg daily, Plavix 75 mg daily x3 weeks then aspirin alone 3. Pain Management: Tylenol as needed 4. Mood: Provide emotional support  -antipsychotic agents: N/A 5. Neuropsych: This patient is capable of making decisions on her own behalf. 6. Skin/Wound Care: Routine skin checks 7. Fluids/Electrolytes/Nutrition: Routine in and outs with follow-up chemistries 8.  Hypertension.  Permissive hypertension.  Patient on Lasix 20 mg daily, Cozaar 50 mg daily, Toprol-XL 50 mg daily prior to admission.  Resume as needed 9.  Diabetes mellitus.  Hemoglobin A1c 7.2.  Lantus insulin 40 units daily.  Check blood sugars before meals and at bedtime 10.  Hypothyroidism.  Continue supplement 11.  CKD stage III.  Baseline creatinine 1.2-1.35.  Follow-up chemistries 12.  Hyperlipidemia.  Patient intolerant with pravastatin and other statins.  Follow-up outpatient with PCP  "I have personally performed a face to face diagnostic evaluation of this patient.  Additionally, I have reviewed and concur with the physician assistant's documentation above." Charlett Blake M.D. [facility] FAAPM&R (Sports Med, Neuromuscular Med) Diplomate Am Board of Electrodiagnostic Med  Cathlyn Parsons,  PA-C 03/09/2019

## 2019-03-09 NOTE — TOC Transition Note (Signed)
Transition of Care [facility]) - CM/SW Discharge Note   Patient Details  Name: KRICKET LEN MRN: [id number] Date of Birth: Feb 13, 1941  [facility] CM/SW Contact:  Pollie Friar, RN Phone Number: 03/09/2019, 12:08 PM   Clinical Narrative:    Pt discharging to CIR today. CM signing off.    Final next level of care: IP Rehab Facility Barriers to Discharge: No Barriers Identified   Patient Goals and CMS Choice        Discharge Placement                       Discharge Plan and Services                                     Social Determinants of Health (SDOH) Interventions     Readmission Risk Interventions No flowsheet data found.

## 2019-03-09 NOTE — Progress Notes (Signed)
Physical Therapy Treatment Patient Details Name: Julia Banks MRN: [id number] DOB: [birth year] Today's Date: 03/09/2019    History of Present Illness  Julia HALSELL is a 78 y.o. female with medical history significant of hypertension, hyperlipidemia, diabetes, CAD, hypothyroidism, chronic kidney disease stage III, sensory neural hearing loss presents to emergency department with complaining of left-sided numbness and weakness. MRI positive for R basal ganglia and corona radiata infarct.     PT Comments    Pt mod assist overall for bed mobility and stand pivot transfer.  She is gaining some strength in her left leg, but remains 0/5 L ankle DF.  Knee blocked for stability during pre gait, standing, and transfer to the R to the recliner chair.  Niece present during session.  Pt encouraged to try to move her left side as much as possible throughout the day.  Pt excited to d/c to CIR today to get more intensive therapy.  PT will continue to follow acutely for safe mobility progression  Follow Up Recommendations  CIR     Equipment Recommendations  3in1 (PT);Wheelchair cushion (measurements PT);Wheelchair (measurements PT);Other (comment)(hemi walker)    Recommendations for Other Services   NA     Precautions / Restrictions Precautions Precautions: Fall Precaution Comments: left hemi    Mobility  Bed Mobility Overal bed mobility: Needs Assistance Bed Mobility: Supine to Sit     Supine to sit: Min assist;Mod assist;HOB elevated     General bed mobility comments: Min assist to get trunk up to sitting with use of R bed rail, mod assist to scoot out to EOB so feet can touch  Transfers Overall transfer level: Needs assistance Equipment used: None Transfers: Sit to/from [facility] Sit to Stand: Mod assist;From elevated surface Stand pivot transfers: Mod assist;From elevated surface       General transfer comment: Mod assist to stand and stabilize her left  leg, cues for weight shift to the left, foot also needed to be blocked as it was sliding forward in gripper socks.  Assist for not only weight shfit to midline, but pt was also rotated at trunk. Using bed rail for leverage on her right side.  Sit to stand x3 for practice before transfer to chair on her R with mod assist to stabilize left leg, progress it towards the chair and steady trunk for balance.   Ambulation/Gait             General Gait Details: NT-safer with second person.        Modified Rankin (Stroke Patients Only) Modified Rankin (Stroke Patients Only) Pre-Morbid Rankin Score: No symptoms Modified Rankin: Severe disability     Balance Overall balance assessment: Needs assistance Sitting-balance support: Feet supported;Single extremity supported Sitting balance-Leahy Scale: Fair Sitting balance - Comments: close supervision EOB, using R hand to stabilize with rail, mild posterior preference, but when cued to pull back forward, she can correct.   Postural control: Posterior lean;Right lateral lean Standing balance support: Single extremity supported Standing balance-Leahy Scale: Poor Standing balance comment: mod assist in standing.                             Cognition Arousal/Alertness: Awake/alert Behavior During Therapy: WFL for tasks assessed/performed Overall Cognitive Status: Within Functional Limits for tasks assessed  General Comments: not formally tested      Exercises General Exercises - Lower Extremity Ankle Circles/Pumps: AROM;PROM;Both;10 reps(encouraged L visualization) Long Arc Quad: AROM;Left;10 reps Hip Flexion/Marching: AROM;Left;5 reps        Pertinent Vitals/Pain Pain Assessment: No/denies pain           PT Goals (current goals can now be found in the care plan section) Acute Rehab PT Goals Patient Stated Goal: to go to rehab and get stronger Progress towards PT goals:  Progressing toward goals    Frequency    Min 4X/week      PT Plan Current plan remains appropriate       AM-PAC PT "6 Clicks" Mobility   Outcome Measure  Help needed turning from your back to your side while in a flat bed without using bedrails?: A Lot Help needed moving from lying on your back to sitting on the side of a flat bed without using bedrails?: A Lot Help needed moving to and from a bed to a chair (including a wheelchair)?: A Lot Help needed standing up from a chair using your arms (e.g., wheelchair or bedside chair)?: A Lot Help needed to walk in hospital room?: Total Help needed climbing 3-5 steps with a railing? : Total 6 Click Score: 10    End of Session Equipment Utilized During Treatment: Gait belt Activity Tolerance: Patient tolerated treatment well Patient left: in chair;with call bell/phone within reach;with family/visitor present   PT Visit Diagnosis: Unsteadiness on feet (R26.81);Muscle weakness (generalized) (M62.81);Hemiplegia and hemiparesis Hemiplegia - Right/Left: Left Hemiplegia - dominant/non-dominant: Non-dominant Hemiplegia - caused by: Cerebral infarction     Time: 1153-1209 PT Time Calculation (min) (ACUTE ONLY): 16 min  Charges:  $Therapeutic Activity: 8-22 mins                    Lenora Gomes B. Yzabelle Calles, PT, DPT  Acute Rehabilitation [phone] pager [phone] office  @ [facility]: [phone]   03/09/2019, 1:58 PM

## 2019-03-09 NOTE — Progress Notes (Signed)
Inpatient Rehabilitation-Admissions Coordinator   [facility] has received medical clearance from Dr. Karleen Hampshire to admit to CIR today. Pt and family are excited to begin rehab and have no further questions about the program. [facility] has notified RN and will update CM/SW regarding plan.   Please call if questions.   Jhonnie Garner, OTR/L  Rehab Admissions Coordinator  [phone] 03/09/2019 10:53 AM

## 2019-03-09 NOTE — Discharge Summary (Signed)
Physician Discharge Summary  Julia Banks [id number] DOB: 11-25-1940 DOA: 03/07/2019  PCP: Ria Bush, MD  Admit date: 03/07/2019 Discharge date: 03/09/2019  Admitted From: Home.  Disposition:  Home  Recommendations for Outpatient Follow-up:  1. Follow up with PCP in 1-2 weeks 2. Please obtain BMP/CBC in one week Please follow up  With neurology in 4 weeks.  Please follow up with cardiology for 30 day event monitor placement.   Discharge Condition: stable.  CODE STATUS:full code.  Diet recommendation: Heart Healthy / Carb Modified   Brief/Interim Summary:  Julia Banks is a 78 year old patient with hypertension, hyperlipidemia, diabetes, coronary artery disease, chronic kidney disease stage III, CAD, partial deafness who presents from home with left-sided weakness and numbness.  She was brought to the hospital as a code stroke. Initial CT of the head including a CTA were negative. [id number] perforator infarct at the right basal ganglia and corona radiata. Neurology consulted and she was started on dual anti platelet therapy for 3 weeks followed by aspirin along.  Therapy eval recommending CIR. Bed available today and plan for discharge later today.   Discharge Diagnoses:  Principal Problem:   Ischemic stroke [facility]) Active Problems:   Type 2 diabetes, uncontrolled, with neuropathy ([place])   Essential hypertension, benign   HLD (hyperlipidemia)   GERD (gastroesophageal reflux disease)   Hypothyroidism   CKD (chronic kidney disease), stage III (HCC)   CVA (cerebral vascular accident) ([place])  Right acute basal ganglion infarct MRI of the brain shows right basal ganglia and right corona radiate  infarct, CTA of the head and neck does not show any ELVO.  Echocardiogram shows left ventricular ejection fraction of 60 to 65% without any thrombosis. Neurology consulted recommended double antiplatelet therapy for 3 weeks followed by aspirin alone. LDL is 144 and hemoglobin  A1c is 7.2. Recommend 30-day cardiac event monitoring as outpatient to rule out atrial fibrillation. Therapy evaluation recommending CIR and plan for discharge to CIR today.   Hypertension elevated plan to slowly restart the blood pressure medications.   Hyperlipidemia patient is on pravastatin but she appears to be intolerant to it.  Recommend follow-up with PCP for Repatha.    Type 2 diabetes mellitus with diabetic neuropathy A1c 7.2 in May and a repeat A1C is 6.8.    Stage III CKD Creatinine stable    Hypothyroidism Continue with Synthroid.   GERD Stable.     Discharge Instructions  Discharge Instructions    Ambulatory referral to Neurology   Complete by: As directed    Follow up with stroke clinic NP (Jessica Vanschaick or Cecille Rubin, if both not available, consider Zachery Dauer, or Ahern) at [facility] in about 4 weeks. Thanks.   Diet - low sodium heart healthy   Complete by: As directed    Discharge instructions   Complete by: As directed    Please follow up with cardiology for 30 day monitor placement for identification of Atrial fibrillation.  Please follow up with neurology as recommended in 4 to 6 weeks.  Please follow up with PCP in one week.     Allergies as of 03/09/2019      Reactions   Altace [ramipril] Other (See Comments)   Upset stomach   Aspirin Nausea And Vomiting   Metformin And Related Other (See Comments)   GI upset      Medication List    TAKE these medications   albuterol 108 (90 Base) MCG/ACT inhaler Commonly known as: VENTOLIN HFA Inhale into the  lungs every 6 (six) hours as needed for wheezing or shortness of breath.   aspirin 81 MG EC tablet Take 1 tablet (81 mg total) by mouth daily. Start taking on: March 10, 2019   BD Pen Needle Nano U/F 32G X 4 MM Misc Generic drug: Insulin Pen Needle USE 1 AS DIRECTED TO INJECT MEDICATION DAILY. DX CODE: E11.40, E11.65   clopidogrel 75 MG tablet Commonly known as: PLAVIX Take  1 tablet (75 mg total) by mouth daily. Start taking on: March 10, 2019   Colace 100 MG capsule Generic drug: docusate sodium Take 200 mg by mouth daily.   diphenhydrAMINE 12.5 MG chewable tablet Commonly known as: BENADRYL Chew 12.5 mg by mouth at bedtime as needed for sleep.   Fluticasone-Salmeterol 250-50 MCG/DOSE Aepb Commonly known as: ADVAIR Inhale 1 puff into the lungs 2 (two) times daily.   furosemide 20 MG tablet Commonly known as: LASIX Take 1 tablet (20 mg total) by mouth every other day. What changed: when to take this   Lantus SoloStar 100 UNIT/ML Solostar Pen Generic drug: Insulin Glargine INJECT 56 UNITS INTO THE SKIN DAILY WITH BREAKFAST What changed:   how much to take  how to take this  when to take this  additional instructions   levothyroxine 25 MCG tablet Commonly known as: SYNTHROID TAKE 1 TABLET BY MOUTH DAILY. EXCEPT 2 TABLETS ON MON, WED, AND FRI What changed:   how much to take  how to take this  when to take this   losartan 50 MG tablet Commonly known as: COZAAR Take 1 tablet (50 mg total) by mouth daily.   metoprolol succinate 50 MG 24 hr tablet Commonly known as: TOPROL-XL Take 1 tablet (50 mg total) by mouth daily.   OMEGA 3 PO Take 1 capsule by mouth daily. 180 EPA/120DHA   OVER THE COUNTER MEDICATION Calcium magnesium 1000mg  day   OVER THE COUNTER MEDICATION Women's multivitamin with iron   pantoprazole 40 MG tablet Commonly known as: PROTONIX TAKE 1 TABLET BY MOUTH EVERY DAY   potassium chloride SA 20 MEQ tablet Commonly known as: Klor-Con M20 Take 1 tablet (20 mEq total) by mouth daily.   pravastatin 40 MG tablet Commonly known as: PRAVACHOL Take 1 tablet (40 mg total) by mouth daily.   vitamin B-12 1000 MCG tablet Commonly known as: CYANOCOBALAMIN Take 1,000 mcg by mouth daily.   vitamin C with rose hips 500 MG tablet Take 500 mg by mouth daily.   VITAMIN D PO Take 5,000 Units by mouth.       Follow-up Information    [facility]. Schedule an appointment as soon as possible for a visit in 4 week(s).   Specialty: Neurology Contact information: [address] [place] [phone]       Ria Bush, MD. Schedule an appointment as soon as possible for a visit in 1 week(s).   Specialty: Family Medicine Contact information: [place] [address] [phone]        [facility] Follow up in 1 week(s).   Specialty: Cardiology Why: for 30 day cardiac event monitor placement, for recently diagnosed stroke.  Contact information: [address] [place] [address] [phone]         Allergies  Allergen Reactions  . Altace [Ramipril] Other (See Comments)    Upset stomach  . Aspirin Nausea And Vomiting  . Metformin And Related Other (See Comments)  GI upset    Consultations:  Neurology.    Procedures/Studies: Ct Angio Head W Or Wo Contrast  Result Date: 03/07/2019 CLINICAL DATA:  Left-sided weakness EXAM: CT ANGIOGRAPHY HEAD AND NECK TECHNIQUE: Multidetector CT imaging of the head and neck was performed using the standard protocol during bolus administration of intravenous contrast. Multiplanar CT image reconstructions and MIPs were obtained to evaluate the vascular anatomy. Carotid stenosis measurements (when applicable) are obtained utilizing NASCET criteria, using the distal internal carotid diameter as the denominator. CONTRAST:  182mL OMNIPAQUE IOHEXOL 350 MG/ML SOLN COMPARISON:  None. FINDINGS: CTA NECK FINDINGS Aortic arch: Mild atherosclerotic plaque.  Three vessel branching. Right carotid system: Mixed density plaque at the ICA bulb/bifurcation with 25% ICA origin narrowing. Negative for ulceration or beading. Left carotid system: Mild atherosclerotic plaque at the bifurcation. No stenosis or ulceration. Vertebral arteries:  Proximal subclavian atherosclerosis without flow limiting stenosis. Strong right vertebral artery dominance. No acquired vertebral stenosis. Skeleton: Spondylosis with bridging cervical and upper thoracic osteophytes. Other neck: Negative Upper chest: Negative Review of the MIP images confirms the above findings CTA HEAD FINDINGS Anterior circulation: Atherosclerotic calcification on the carotid siphons. No branch occlusion or proximal flow limiting stenosis. Negative for aneurysm. Posterior circulation: Strong right vertebral artery dominance. Most left vertebral flow is to the PICA. Basilar is diffusely patent and small in the setting of bilateral fetal type PCA. No branch occlusion. Negative for aneurysm Venous sinuses: Diffusely patent Anatomic variants: As above Review of the MIP images confirms the above findings IMPRESSION: 1. No emergent finding. 2. Atherosclerosis without flow limiting stenosis in the head and neck. Electronically Signed   By: Monte Fantasia M.D.   On: 03/07/2019 10:54   Ct Angio Neck W Or Wo Contrast  Result Date: 03/07/2019 CLINICAL DATA:  Left-sided weakness EXAM: CT ANGIOGRAPHY HEAD AND NECK TECHNIQUE: Multidetector CT imaging of the head and neck was performed using the standard protocol during bolus administration of intravenous contrast. Multiplanar CT image reconstructions and MIPs were obtained to evaluate the vascular anatomy. Carotid stenosis measurements (when applicable) are obtained utilizing NASCET criteria, using the distal internal carotid diameter as the denominator. CONTRAST:  122mL OMNIPAQUE IOHEXOL 350 MG/ML SOLN COMPARISON:  None. FINDINGS: CTA NECK FINDINGS Aortic arch: Mild atherosclerotic plaque.  Three vessel branching. Right carotid system: Mixed density plaque at the ICA bulb/bifurcation with 25% ICA origin narrowing. Negative for ulceration or beading. Left carotid system: Mild atherosclerotic plaque at the bifurcation. No stenosis or ulceration. Vertebral  arteries: Proximal subclavian atherosclerosis without flow limiting stenosis. Strong right vertebral artery dominance. No acquired vertebral stenosis. Skeleton: Spondylosis with bridging cervical and upper thoracic osteophytes. Other neck: Negative Upper chest: Negative Review of the MIP images confirms the above findings CTA HEAD FINDINGS Anterior circulation: Atherosclerotic calcification on the carotid siphons. No branch occlusion or proximal flow limiting stenosis. Negative for aneurysm. Posterior circulation: Strong right vertebral artery dominance. Most left vertebral flow is to the PICA. Basilar is diffusely patent and small in the setting of bilateral fetal type PCA. No branch occlusion. Negative for aneurysm Venous sinuses: Diffusely patent Anatomic variants: As above Review of the MIP images confirms the above findings IMPRESSION: 1. No emergent finding. 2. Atherosclerosis without flow limiting stenosis in the head and neck. Electronically Signed   By: Monte Fantasia M.D.   On: 03/07/2019 10:54   Mr Brain Wo Contrast  Result Date: 03/07/2019 CLINICAL DATA:  Left-sided weakness this morning EXAM: MRI HEAD WITHOUT CONTRAST TECHNIQUE: Multiplanar, multiecho pulse sequences of  the brain and surrounding structures were obtained without intravenous contrast. COMPARISON:  Head CT and CTA from earlier today FINDINGS: Brain: Acute perforator infarct on the right affecting corona radiata, posterior caudate, and upper putamen. No pre-existing infarct. No hemorrhage, hydrocephalus, collection, or masslike finding Vascular: No change from prior CTA. Skull and upper cervical spine: Negative for marrow lesion. Sinuses/Orbits: Negative IMPRESSION: Acute perforator infarct at the right basal ganglia and corona radiata. Electronically Signed   By: Monte Fantasia M.D.   On: 03/07/2019 14:31   Ct Head Code Stroke Wo Contrast  Result Date: 03/07/2019 CLINICAL DATA:  Code stroke.  Left-sided weakness EXAM: CT HEAD  WITHOUT CONTRAST TECHNIQUE: Contiguous axial images were obtained from the base of the skull through the vertex without intravenous contrast. COMPARISON:  None. FINDINGS: Brain: No evidence of acute infarction, hemorrhage, hydrocephalus, extra-axial collection or mass lesion/mass effect. Vascular: No hyperdense vessel.  Atherosclerotic calcification. Skull: Normal. Negative for fracture or focal lesion. Sinuses/Orbits: Essentially clear sinuses. Bone island or other benign process posterior to the right sphenoid sinus. Other: These results were communicated to Dr. Rory Percy at 10:11 amon 9/7/2020by text page via the [facility] messaging system. ASPECTS [facility] Stroke Program Early CT Score) - Ganglionic level infarction (caudate, lentiform nuclei, internal capsule, insula, M1-M3 cortex): 7 - Supraganglionic infarction (M4-M6 cortex): 3 Total score (0-10 with 10 being normal): 10 IMPRESSION: 1. No acute finding. 2. ASPECTS is 10. Electronically Signed   By: Monte Fantasia M.D.   On: 03/07/2019 10:12       Subjective: Tingling and numbness in the right lower extremity improved.  Some tingling in the left face.  No new complaints. No chest pain or sob.   Discharge Exam: Vitals:   03/09/19 0818 03/09/19 0837  BP:  (!) 166/57  Pulse: 74 76  Resp: 18 (!) 22  Temp:  98.1 F (36.7 C)  SpO2: 96% 97%   Vitals:   03/09/19 0033 03/09/19 0348 03/09/19 0818 03/09/19 0837  BP: (!) 160/51 (!) 161/49  (!) 166/57  Pulse: 74 73 74 76  Resp: 15 18 18  (!) 22  Temp: 97.9 F (36.6 C) 98.7 F (37.1 C)  98.1 F (36.7 C)  TempSrc: Oral Oral  Oral  SpO2: 97% 100% 96% 97%    General: Pt is alert, awake, not in acute distress Cardiovascular: RRR, S1/S2 +, no rubs, no gallops Respiratory: CTA bilaterally, no wheezing, no rhonchi Abdominal: Soft, NT, ND, bowel sounds + Extremities: no edema, no cyanosis    The results of significant diagnostics from this hospitalization (including imaging, microbiology,  ancillary and laboratory) are listed below for reference.     Microbiology: Recent Results (from the past 240 hour(s))  SARS CORONAVIRUS 2 (TAT 6-24 HRS) Nasopharyngeal Nasopharyngeal Swab     Status: None   Collection Time: 03/07/19  3:53 PM   Specimen: Nasopharyngeal Swab  Result Value Ref Range Status   SARS Coronavirus 2 NEGATIVE NEGATIVE Final    Comment: (NOTE) SARS-CoV-2 target nucleic acids are NOT DETECTED. The SARS-CoV-2 RNA is generally detectable in upper and lower respiratory specimens during the acute phase of infection. Negative results do not preclude SARS-CoV-2 infection, do not rule out co-infections with other pathogens, and should not be used as the sole basis for treatment or other patient management decisions. Negative results must be combined with clinical observations, patient history, and epidemiological information. The expected result is Negative. Fact Sheet for Patients: SugarRoll.be Fact Sheet for Healthcare Providers: https://www.woods-mathews.com/ This test is not yet approved  or cleared by the [place] and  has been authorized for detection and/or diagnosis of SARS-CoV-2 by FDA under an Emergency Use Authorization (EUA). This EUA will remain  in effect (meaning this test can be used) for the duration of the COVID-19 declaration under Section 56 4(b)(1) of the Act, 21 U.S.C. section 360bbb-3(b)(1), unless the authorization is terminated or revoked sooner. Performed at [facility], [place] [address], [place], [place] [address]      Labs: BNP (last 3 results) No results for input(s): BNP in the last 8760 hours. Basic Metabolic Panel: Recent Labs  Lab 03/07/19 0959 03/07/19 1003  NA 141 140  K 4.3 4.2  CL 108 106  CO2 24  --   GLUCOSE 125* 120*  BUN 16 18  CREATININE 1.53* 1.40*  CALCIUM 9.3  --    Liver Function Tests: Recent Labs  Lab 03/07/19 0959  AST 19  ALT 15  ALKPHOS 92   BILITOT 1.0  PROT 7.0  ALBUMIN 4.1   No results for input(s): LIPASE, AMYLASE in the last 168 hours. No results for input(s): AMMONIA in the last 168 hours. CBC: Recent Labs  Lab 03/07/19 0959 03/07/19 1003  WBC 8.2  --   NEUTROABS 5.0  --   HGB 14.1 13.9  HCT 42.1 41.0  MCV 87.9  --   PLT 148*  --    Cardiac Enzymes: No results for input(s): CKTOTAL, CKMB, CKMBINDEX, TROPONINI in the last 168 hours. BNP: Invalid input(s): POCBNP CBG: Recent Labs  Lab 03/08/19 0852 03/08/19 1320 03/08/19 1611 03/08/19 2208 03/09/19 0602  GLUCAP 193* 288* 325* 237* 193*   D-Dimer No results for input(s): DDIMER in the last 72 hours. Hgb A1c Recent Labs    03/08/19 0327  HGBA1C 6.8*   Lipid Profile Recent Labs    03/08/19 0327  CHOL 207*  HDL 31*  LDLCALC 144*  TRIG 160*  CHOLHDL 6.7   Thyroid function studies Recent Labs    03/08/19 1115  TSH 3.531   Anemia work up No results for input(s): VITAMINB12, FOLATE, FERRITIN, TIBC, IRON, RETICCTPCT in the last 72 hours. Urinalysis    Component Value Date/Time   COLORURINE YELLOW 05/31/2014 1059   APPEARANCEUR CLEAR 05/31/2014 1059   LABSPEC 1.017 05/31/2014 1059   PHURINE 6.0 05/31/2014 1059   GLUCOSEU NEGATIVE 05/31/2014 1059   HGBUR NEGATIVE 05/31/2014 1059   BILIRUBINUR NEGATIVE 05/31/2014 1059   KETONESUR NEGATIVE 05/31/2014 1059   PROTEINUR NEGATIVE 05/31/2014 1059   UROBILINOGEN 0.2 05/31/2014 1059   NITRITE NEGATIVE 05/31/2014 1059   LEUKOCYTESUR NEGATIVE 05/31/2014 1059   Sepsis Labs Invalid input(s): PROCALCITONIN,  WBC,  LACTICIDVEN Microbiology Recent Results (from the past 240 hour(s))  SARS CORONAVIRUS 2 (TAT 6-24 HRS) Nasopharyngeal Nasopharyngeal Swab     Status: None   Collection Time: 03/07/19  3:53 PM   Specimen: Nasopharyngeal Swab  Result Value Ref Range Status   SARS Coronavirus 2 NEGATIVE NEGATIVE Final    Comment: (NOTE) SARS-CoV-2 target nucleic acids are NOT DETECTED. The  SARS-CoV-2 RNA is generally detectable in upper and lower respiratory specimens during the acute phase of infection. Negative results do not preclude SARS-CoV-2 infection, do not rule out co-infections with other pathogens, and should not be used as the sole basis for treatment or other patient management decisions. Negative results must be combined with clinical observations, patient history, and epidemiological information. The expected result is Negative. Fact Sheet for Patients: SugarRoll.be Fact Sheet for Healthcare Providers: https://www.woods-mathews.com/  This test is not yet approved or cleared by the [place] and  has been authorized for detection and/or diagnosis of SARS-CoV-2 by FDA under an Emergency Use Authorization (EUA). This EUA will remain  in effect (meaning this test can be used) for the duration of the COVID-19 declaration under Section 56 4(b)(1) of the Act, 21 U.S.C. section 360bbb-3(b)(1), unless the authorization is terminated or revoked sooner. Performed at [facility], [place] [address], [place], [place] [address]      Time coordinating discharge: 32 minutes  SIGNED:   Hosie Poisson, MD  [facility] 03/09/2019, 10:53 AM Pager   If 7PM-7AM, please contact night-coverage [email or web address] Password [email or web address]

## 2019-03-10 ENCOUNTER — Inpatient Hospital Stay (HOSPITAL_COMMUNITY): Payer: Medicare Other | Admitting: Physical Therapy

## 2019-03-10 ENCOUNTER — Other Ambulatory Visit: Payer: Self-pay | Admitting: Cardiology

## 2019-03-10 ENCOUNTER — Inpatient Hospital Stay (HOSPITAL_COMMUNITY): Payer: Medicare Other | Admitting: Occupational Therapy

## 2019-03-10 DIAGNOSIS — I639 Cerebral infarction, unspecified: Secondary | ICD-10-CM

## 2019-03-10 LAB — CBC WITH DIFFERENTIAL/PLATELET
Abs Immature Granulocytes: 0.03 10*3/uL (ref 0.00–0.07)
Basophils Absolute: 0 10*3/uL (ref 0.0–0.1)
Basophils Relative: 0 %
Eosinophils Absolute: 0.4 10*3/uL (ref 0.0–0.5)
Eosinophils Relative: 5 %
HCT: 35 % — ABNORMAL LOW (ref 36.0–46.0)
Hemoglobin: 11.7 g/dL — ABNORMAL LOW (ref 12.0–15.0)
Immature Granulocytes: 0 %
Lymphocytes Relative: 23 %
Lymphs Abs: 1.8 10*3/uL (ref 0.7–4.0)
MCH: 29.1 pg (ref 26.0–34.0)
MCHC: 33.4 g/dL (ref 30.0–36.0)
MCV: 87.1 fL (ref 80.0–100.0)
Monocytes Absolute: 0.6 10*3/uL (ref 0.1–1.0)
Monocytes Relative: 7 %
Neutro Abs: 5.1 10*3/uL (ref 1.7–7.7)
Neutrophils Relative %: 65 %
Platelets: 160 10*3/uL (ref 150–400)
RBC: 4.02 MIL/uL (ref 3.87–5.11)
RDW: 13.3 % (ref 11.5–15.5)
WBC: 7.8 10*3/uL (ref 4.0–10.5)
nRBC: 0 % (ref 0.0–0.2)

## 2019-03-10 LAB — GLUCOSE, CAPILLARY
Glucose-Capillary: 134 mg/dL — ABNORMAL HIGH (ref 70–99)
Glucose-Capillary: 175 mg/dL — ABNORMAL HIGH (ref 70–99)
Glucose-Capillary: 239 mg/dL — ABNORMAL HIGH (ref 70–99)
Glucose-Capillary: 279 mg/dL — ABNORMAL HIGH (ref 70–99)

## 2019-03-10 LAB — COMPREHENSIVE METABOLIC PANEL
ALT: 14 U/L (ref 0–44)
AST: 17 U/L (ref 15–41)
Albumin: 3.3 g/dL — ABNORMAL LOW (ref 3.5–5.0)
Alkaline Phosphatase: 81 U/L (ref 38–126)
Anion gap: 9 (ref 5–15)
BUN: 16 mg/dL (ref 8–23)
CO2: 23 mmol/L (ref 22–32)
Calcium: 9 mg/dL (ref 8.9–10.3)
Chloride: 105 mmol/L (ref 98–111)
Creatinine, Ser: 1.38 mg/dL — ABNORMAL HIGH (ref 0.44–1.00)
GFR calc Af Amer: 43 mL/min — ABNORMAL LOW (ref >60)
GFR calc non Af Amer: 37 mL/min — ABNORMAL LOW (ref >60)
Glucose, Bld: 149 mg/dL — ABNORMAL HIGH (ref 70–99)
Potassium: 4.1 mmol/L (ref 3.5–5.1)
Sodium: 137 mmol/L (ref 135–145)
Total Bilirubin: 0.5 mg/dL (ref 0.3–1.2)
Total Protein: 6.1 g/dL — ABNORMAL LOW (ref 6.5–8.1)

## 2019-03-10 NOTE — Evaluation (Signed)
Physical Therapy Assessment and Plan  Patient Details  Name: Julia Banks MRN: [id number] Date of Birth: 23-May-1941  PT Diagnosis: Abnormal posture, Abnormality of gait, Ataxia, Ataxic gait, Coordination disorder, Hemiplegia non-dominant, Hypotonia and Muscle weakness Rehab Potential: Good ELOS: 21-24days   Today's Date: 03/10/2019 PT Individual Time: 0626-9485 PT Individual Time Calculation (min): 60 min    Problem List:  Patient Active Problem List   Diagnosis Date Noted  . Cerebrovascular accident (CVA) of right basal ganglia (Julia Banks) 03/09/2019  . Hemiparesis affecting left side as late effect of cerebrovascular accident (CVA) (Julia [facility]) 03/09/2019  . CVA (cerebral vascular accident) (Julia Banks) 03/08/2019  . Ischemic stroke (Julia Banks) 03/07/2019  . CKD (chronic kidney disease), stage III (Julia Banks) 03/07/2019  . Advanced care planning/counseling discussion 01/27/2016  . Sensorineural hearing loss (SNHL) of both ears 01/27/2016  . Chest discomfort 01/27/2016  . Insomnia 02/13/2015  . Chronic cough 10/10/2014  . Osteopenia 07/31/2014  . S/P mastectomy 06/07/2014  . Genetic testing 06/02/2014  . Breast cancer of upper-outer quadrant of left female breast (Julia Banks) 03/28/2014  . Grieving 10/25/2013  . Secondary hyperparathyroidism of renal origin (Julia Banks)   . Medicare annual wellness visit, subsequent 06/28/2013  . Diabetes mellitus with stage 3 chronic kidney disease (Julia Banks)   . Hypothyroidism 04/13/2013  . GERD (gastroesophageal reflux disease)   . Essential hypertension, benign 09/17/2012  . HLD (hyperlipidemia) 09/17/2012  . Type 2 diabetes, uncontrolled, with neuropathy (Julia Banks) 02/15/2012  . OSA on CPAP 02/13/2012  . Coronary artery disease 02/13/2012    Past Medical History:  Past Medical History:  Diagnosis Date  . Arthritis   . Asthma   . Breast cancer, left breast ([place]) 02/2014   DCIS, ER+ PR- initially rec bilat mastectomy with sentinal LN biopsy L axilla but now planning just L  mastectomy Julia [facility])  . Chronic kidney disease, stage 3 (Julia Banks)   . Colon polyps    benign  . Complication of anesthesia    hard to wake up  . Coronary artery disease    30% stenosis reported on previous cardiac catheterization in 2006. This was done in [place]  . GERD (gastroesophageal reflux disease)   . Headaches, cluster   . Hyperlipidemia   . Hypertension   . Hypothyroidism   . Osteopenia 07/2014   DEXA 0.5 spine, 01.7 R femoral neck  . Pneumonia    years ago  . Secondary hyperparathyroidism of renal origin (Julia Banks)    likely  . Sensorineural hearing loss (SNHL) of both ears 01/27/2016   Audiology eval - moderately severe sensorineural hearing loss bilaterally rec amplification (07/2016 - [facility] audiologist)  . Sleep apnea    on CPAP  . Thyroid disease   . Type 2 diabetes, uncontrolled, with neuropathy (Julia Banks)    followed by endo Julia [facility]   Past Surgical History:  Past Surgical History:  Procedure Laterality Date  . ABDOMINAL HYSTERECTOMY     heavy bleeding, ovaries remain  . APPENDECTOMY    . BREAST BIOPSY Right    benign  . BREAST BIOPSY Left 02/2014   ?DCIS  . CARDIAC CATHETERIZATION  2006   [place].   . CARDIAC CATHETERIZATION  10/2013   mild 2v nonobstructive disease, EF 60%  . CARDIOVASCULAR STRESS TEST  09/2012   WNL (Julia Banks)  . CHOLECYSTECTOMY    . COLONOSCOPY  05/2010   2 TAs, diverticulosis, rpt 2 yrs ([place] [facility] in [place])  . COLONOSCOPY  12/2014   1 TA, rpt 5  yrs Julia Banks)  . cortisone right knee  01/04/15  . GALLBLADDER SURGERY    . MASTECTOMY W/ SENTINEL NODE BIOPSY Left 06/07/2014   Julia [facility]  . SIMPLE MASTECTOMY WITH AXILLARY SENTINEL NODE BIOPSY Left 06/07/2014   Procedure: LEFT TOTAL MASTECTOMY WITH LEFT AXILLARY SENTINEL NODE BIOPSY;  Surgeon: Julia Bookbinder, MD;  Location: [facility] OR;  Service: General;  Laterality: Left;    Assessment & Plan Clinical Impression: Patient is a 78 year old right-handed female  with history of hypertension, hyperlipidemia, diabetes mellitus, sensorineural hearing loss, CAD, CKD stage III.  Per chart review patient lives alone independent prior to admission.  2 level home with bed and bath on main level 4 steps to entry.  Presented 03/07/2019 with left side weakness as well as mild dysarthria.  Cranial CT scan negative.  Patient did not receive TPA.  CT angiogram of head and neck with no emergent findings.  MRI showed acute perforator infarct at the right basal ganglia and corona radiata.  Echocardiogram with ejection fraction of 65% normal systolic function.    Patient transferred to CIR on 03/09/2019 .   Patient currently requires max with mobility secondary to muscle weakness and muscle joint tightness, decreased cardiorespiratoy endurance, impaired timing and sequencing, unbalanced muscle activation, ataxia, decreased coordination and decreased motor planning, decreased attention to left and decreased sitting balance, decreased standing balance, decreased postural control, hemiplegia and decreased balance strategies.  Prior to hospitalization, patient was independent  with mobility and lived with Alone in a House home.  Home access is 3Stairs to enter.  Patient will benefit from skilled PT intervention to maximize safe functional mobility, minimize fall risk and decrease caregiver burden for planned discharge home with 24 hour supervision.  Anticipate patient will benefit from follow up [place] at discharge.  PT - End of Session Activity Tolerance: Tolerates < 10 min activity, no significant change in vital signs Endurance Deficit: Yes PT Assessment Rehab Potential (ACUTE/IP ONLY): Good PT Barriers to Discharge: Inaccessible home environment;Home environment access/layout PT Patient demonstrates impairments in the following area(s): Balance;Edema;Endurance;Motor;Pain;Perception;Safety;Skin Integrity PT Transfers Functional Problem(s): Bed Mobility;Bed to  Chair;Car;Furniture;Floor PT Locomotion Functional Problem(s): Ambulation;Wheelchair Mobility;Stairs PT Plan PT Intensity: Minimum of 1-2 x/day ,45 to 90 minutes PT Frequency: 5 out of 7 days PT Duration Estimated Length of Stay: 21-24days PT Treatment/Interventions: Ambulation/gait training;Discharge planning;Functional mobility training;Psychosocial support;Therapeutic Activities;Visual/perceptual remediation/compensation;Wheelchair propulsion/positioning;Therapeutic Exercise;Skin care/wound management;Neuromuscular re-education;Disease management/prevention;Balance/vestibular training;Cognitive remediation/compensation;DME/adaptive equipment instruction;Pain management;Splinting/orthotics;UE/LE Strength taining/ROM;Community reintegration;Patient/family education;Stair training;UE/LE Coordination activities;Functional electrical stimulation PT Transfers Anticipated Outcome(s): Supervision assist with LRAD PT Locomotion Anticipated Outcome(s): Ambulatory with supervision assist. and Mod I WC mobility PT Recommendation Recommendations for Other Services: Therapeutic Recreation consult Therapeutic Recreation Interventions: Stress management Follow Up Recommendations: Home health PT Patient destination: Home Equipment Recommended: Rolling walker with 5" wheels;Wheelchair (measurements);Wheelchair cushion (measurements)  Skilled Therapeutic Intervention Pt received sitting in WC and agreeable to PT. PT instructed patient in PT Evaluation and initiated treatment intervention; see below for results. PT educated patient in [place], rehab potential, rehab goals, and discharge recommendations. PT instructed pt in gait with no AD as listed below and with max assist +2 for gait with RW x 49f due to impulsivity.  Car transfer with Max assist and max cues for  Sequencing, gait pattern and posture. Transfers and bed mobility listed below. [facility] and  all needs met.         PT Evaluation Precautions/Restrictions   fall Pain  denies Home Living/Prior Functioning Home Living Living Arrangements: Alone Available Help at Discharge: Family;Available 24 hours/day Type of Home: House Home Access: Stairs to enter [employer] of Steps: 3 Entrance Stairs-Rails: Right Home Layout: Two level;Able to live on main level with bedroom/bathroom Alternate Level Stairs-Number of Steps: 15 Alternate Level Stairs-Rails: Left Bathroom Shower/Tub: Multimedia programmer: Handicapped height Bathroom Accessibility: Yes Additional Comments: RW may fit into toilet room.  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 Vision/Perception  Vision - Assessment Eye Alignment: Within Functional Limits Ocular Range of Motion: Within Functional Limits Alignment/Gaze Preference: Within Defined Limits Perception Perception: Within Functional Limits Praxis Praxis: Intact  Cognition Overall Cognitive Status: Within Functional Limits for tasks assessed Orientation Level: Oriented X4 Sensation Sensation Light Touch: Appears Intact Proprioception: Appears Intact Coordination Gross Motor Movements are Fluid and Coordinated: No Fine Motor Movements are Fluid and Coordinated: No Coordination and Movement Description: dense L hemiplegia on the L. Heel Shin Test: unable to test due to strength deficits. Motor  Motor Motor: Hemiplegia;Motor impersistence Motor - Skilled Clinical Observations: Dense L hemiplegia UE>LE  Mobility Bed Mobility Bed Mobility: Supine to Sit;Sit to Supine Supine to Sit: Minimal Assistance - Patient > 75% Sit to Supine: Minimal Assistance - Patient > 75% Transfers Transfers: Sit to Stand;Stand to  Sit;Stand Pivot Transfers Sit to Stand: Moderate Assistance - Patient 50-74% Stand to Sit: Moderate Assistance - Patient 50-74% Stand Pivot Transfers: Maximal Assistance - Patient 25 - 49% Transfer (Assistive device): None Locomotion  Gait Ambulation: Yes Gait Assistance: 2 Helpers Gait Distance (Feet): 12 Feet Assistive device: None Gait Gait: Yes Gait Pattern: Impaired Gait Pattern: Lateral hip instability;Decreased trunk rotation;Ataxic;Narrow base of support Stairs / Additional Locomotion Stairs: No Architect: Yes Wheelchair Assistance: Minimal assistance - Patient >75% Wheelchair Propulsion: Right upper extremity;Right lower extremity Wheelchair Parts Management: Needs assistance Distance: 120  Trunk/Postural Assessment  Cervical Assessment Cervical Assessment: Within Functional Limits Thoracic Assessment Thoracic Assessment: Exceptions to WFL(rounded shoulders) Lumbar Assessment Lumbar Assessment: Exceptions to [facility] Postural Control Postural Control: Deficits on evaluation Righting Reactions: limited in standing Protective Responses: delayed in standing Postural Limitations: poor awareness of LOB to the L  Balance   Extremity Assessment      RLE Assessment RLE Assessment: Within Functional Limits      Refer to Care Plan for Long Term Goals  Recommendations for other services: Therapeutic Recreation  Stress management  Discharge Criteria: Patient will be discharged from PT if patient refuses treatment 3 consecutive times without medical reason, if treatment goals not met, if there is a change in medical status, if patient makes no progress towards goals or if patient is discharged from [facility].  The above assessment, treatment plan, treatment alternatives and goals were discussed and mutually agreed upon: by patient and by family  Lorie Phenix 03/10/2019, 12:18 PM

## 2019-03-10 NOTE — Progress Notes (Signed)
Patient information reviewed and entered into e[facility]hab System by Becky Cresencia Asmus, PPS coordinator. Information including medical coding, function ability, and quality indicators will be reviewed and updated through discharge.   

## 2019-03-10 NOTE — Evaluation (Signed)
Occupational Therapy Assessment and Plan  Patient Details  Name: Julia Banks MRN: [id number] Date of Birth: October 14, 1940  OT Diagnosis: abnormal posture, hemiplegia affecting non-dominant side, muscle weakness (generalized) and coordination disorder, decreased tone Rehab Potential: Rehab Potential (ACUTE ONLY): Good ELOS: 21-24 days   Today's Date: 03/10/2019 OT Individual Time: 0800-0900 OT Individual Time Calculation (min): 60 min     Problem List:  Patient Active Problem List   Diagnosis Date Noted  . Cerebrovascular accident (CVA) of right basal ganglia ([place]) 03/09/2019  . Hemiparesis affecting left side as late effect of cerebrovascular accident (CVA) ([place]) 03/09/2019  . CVA (cerebral vascular accident) ([place]) 03/08/2019  . Ischemic stroke ([place]) 03/07/2019  . CKD (chronic kidney disease), stage III ([place]) 03/07/2019  . Advanced care planning/counseling discussion 01/27/2016  . Sensorineural hearing loss (SNHL) of both ears 01/27/2016  . Chest discomfort 01/27/2016  . Insomnia 02/13/2015  . Chronic cough 10/10/2014  . Osteopenia 07/31/2014  . S/P mastectomy 06/07/2014  . Genetic testing 06/02/2014  . Breast cancer of upper-outer quadrant of left female breast ([place]) 03/28/2014  . Grieving 10/25/2013  . Secondary hyperparathyroidism of renal origin ([place])   . Medicare annual wellness visit, subsequent 06/28/2013  . Diabetes mellitus with stage 3 chronic kidney disease ([place])   . Hypothyroidism 04/13/2013  . GERD (gastroesophageal reflux disease)   . Essential hypertension, benign 09/17/2012  . HLD (hyperlipidemia) 09/17/2012  . Type 2 diabetes, uncontrolled, with neuropathy ([place]) 02/15/2012  . OSA on CPAP 02/13/2012  . Coronary artery disease 02/13/2012    Past Medical History:  Past Medical History:  Diagnosis Date  . Arthritis   . Asthma   . Breast cancer, left breast ([place]) 02/2014   DCIS, ER+ PR- initially rec bilat mastectomy with sentinal LN biopsy L axilla but  now planning just L mastectomy [name])  . Chronic kidney disease, stage 3 ([place])   . Colon polyps    benign  . Complication of anesthesia    hard to wake up  . Coronary artery disease    30% stenosis reported on previous cardiac catheterization in 2006. This was done in [facility]  . GERD (gastroesophageal reflux disease)   . Headaches, cluster   . Hyperlipidemia   . Hypertension   . Hypothyroidism   . Osteopenia 07/2014   DEXA 0.5 spine, 01.7 R femoral neck  . Pneumonia    years ago  . Secondary hyperparathyroidism of renal origin ([place])    likely  . Sensorineural hearing loss (SNHL) of both ears 01/27/2016   Audiology eval - moderately severe sensorineural hearing loss bilaterally rec amplification (07/2016 - [name] audiologist)  . Sleep apnea    on CPAP  . Thyroid disease   . Type 2 diabetes, uncontrolled, with neuropathy ([place])    followed by endo Dr Buddy Duty   Past Surgical History:  Past Surgical History:  Procedure Laterality Date  . ABDOMINAL HYSTERECTOMY     heavy bleeding, ovaries remain  . APPENDECTOMY    . BREAST BIOPSY Right    benign  . BREAST BIOPSY Left 02/2014   ?DCIS  . CARDIAC CATHETERIZATION  2006   [place].   . CARDIAC CATHETERIZATION  10/2013   mild 2v nonobstructive disease, EF 60%  . CARDIOVASCULAR STRESS TEST  09/2012   WNL ([place])  . CHOLECYSTECTOMY    . COLONOSCOPY  05/2010   2 TAs, diverticulosis, rpt 2 yrs (Dr Elissa Hefty in [place])  . COLONOSCOPY  12/2014  1 TA, rpt 5 yrs Henrene Pastor)  . cortisone right knee  01/04/15  . GALLBLADDER SURGERY    . MASTECTOMY W/ SENTINEL NODE BIOPSY Left 06/07/2014   DR [place]  . SIMPLE MASTECTOMY WITH AXILLARY SENTINEL NODE BIOPSY Left 06/07/2014   Procedure: LEFT TOTAL MASTECTOMY WITH LEFT AXILLARY SENTINEL NODE BIOPSY;  Surgeon: Rolm Bookbinder, MD;  Location: [facility] OR;  Service: General;  Laterality: Left;    Assessment & Plan Clinical Impression: Patient is a 78 y.o.  year old female with history of hypertension, hyperlipidemia, diabetes mellitus, sensorineural hearing loss, CAD, CKD stage III.  Per chart review patient lives alone independent prior to admission.  2 level home with bed and bath on main level 4 steps to entry.  Presented 03/07/2019 with left side weakness as well as mild dysarthria.  Cranial CT scan negative.  Patient did not receive TPA.  CT angiogram of head and neck with no emergent findings.  MRI showed acute perforator infarct at the right basal ganglia and corona radiata.  Echocardiogram with ejection fraction of 51% normal systolic function.  Neurology follow-up maintained on aspirin and Plavix for CVA prophylaxis x3 weeks then aspirin alone.  Subcutaneous Lovenox for DVT prophylaxis.  Plan for 30-day cardiac event monitor.  Tolerating a regular diet.  .  Patient transferred to CIR on 03/09/2019 .    Patient currently requires max with basic self-care skills secondary to muscle weakness, decreased cardiorespiratoy endurance, abnormal tone, decreased coordination and decreased motor planning and decreased sitting balance, decreased standing balance, decreased postural control, hemiplegia and decreased balance strategies.  Prior to hospitalization, patient could complete ADLs and IADLs with independent .  Patient will benefit from skilled intervention to decrease level of assist with basic self-care skills prior to discharge home with care partner.  Anticipate patient will require 24 hour supervision and follow up home health.  OT - End of Session Activity Tolerance: Decreased this session Endurance Deficit: Yes Endurance Deficit Description: multiple rest breaks secondary to fatigue OT Assessment Rehab Potential (ACUTE ONLY): Good OT Barriers to Discharge Comments: none known at this time OT Patient demonstrates impairments in the following area(s): Balance;Behavior;Cognition;Endurance;Motor;Pain;Perception;Safety OT Basic ADL's Functional  Problem(s): Grooming;Bathing;Dressing;Toileting OT Advanced ADL's Functional Problem(s): Simple Meal Preparation OT Transfers Functional Problem(s): Toilet;Tub/Shower OT Additional Impairment(s): Fuctional Use of Upper Extremity OT Plan OT Intensity: Minimum of 1-2 x/day, 45 to 90 minutes OT Frequency: 5 out of 7 days OT Duration/Estimated Length of Stay: 21-24 days OT Treatment/Interventions: Balance/vestibular training;DME/adaptive equipment instruction;Patient/family education;Therapeutic Activities;Cognitive remediation/compensation;Functional electrical stimulation;Psychosocial support;Therapeutic Exercise;Community reintegration;Functional mobility training;Self Care/advanced ADL retraining;UE/LE Strength taining/ROM;Discharge planning;Neuromuscular re-education;UE/LE Coordination activities;Pain management OT Self Feeding Anticipated Outcome(s): set up A OT Basic Self-Care Anticipated Outcome(s): S overall OT Toileting Anticipated Outcome(s): S overall OT Bathroom Transfers Anticipated Outcome(s): S overall OT Recommendation Recommendations for Other Services: Neuropsych consult Patient destination: Home Follow Up Recommendations: 24 hour supervision/assistance;Home health OT Equipment Recommended: To be determined   Skilled Therapeutic Intervention Upon entering the room, pt supine in bed with no c/o pain. OT educating pt on OT purpose, POC, and goals with pt verbalizing understanding and agreement. Pt performed supine >sit with mod A to EOB. Pt with L lateral lean and S - min guard for sitting balance. Pt bathing and dressing while seated on EOB with focus on hemiplegic techniques. Hand over hand assistance to integrate L UE into functional tasks. Pt standing with mod A and L knee buckling. Use of RW and L hand orthosis to transfer from bed >wheelchair with  mod- max A. Pt seated in recliner chair with call bell and all needed items within reach.   OT  Evaluation Precautions/Restrictions  Precautions Precautions: Fall Precaution Comments: left hemi Restrictions Weight Bearing Restrictions: No Pain Pain Assessment Pain Scale: 0-10 Pain Score: 0-No pain Home Living/Prior Functioning Home Living Living Arrangements: Alone Available Help at Discharge: Family, Available 24 hours/day Type of Home: House Home Access: Stairs to enter [employer] of Steps: 3 Entrance Stairs-Rails: Right Home Layout: Two level, Able to live on main level with bedroom/bathroom Alternate Level Stairs-Number of Steps: 15 Alternate Level Stairs-Rails: Left Bathroom Shower/Tub: Multimedia programmer: Handicapped height Bathroom Accessibility: Yes Additional Comments: RW may fit into toilet room.  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 Comments: Driving Vision Baseline Vision/History: Wears glasses Wears Glasses: Reading only Patient Visual Report: Diplopia Eye Alignment: Within Functional Limits Ocular Range of Motion: Within Functional Limits Alignment/Gaze Preference: Within Defined Limits Perception  Perception: Within Functional Limits Praxis Praxis: Intact Cognition Overall Cognitive Status: Within Functional Limits for tasks assessed Arousal/Alertness: Awake/alert Orientation Level: Person;Place;Situation Person: Oriented Place: Oriented Situation: Oriented Year: 2020 Month: September Day of Week: Correct Memory: Appears intact Immediate Memory Recall: Sock;Blue;Bed Memory Recall Sock: Without Cue Memory Recall Blue: Without Cue Memory Recall Bed: Without Cue Focused Attention: Appears intact Sustained Attention: Appears intact Awareness: Appears intact Problem Solving: Appears  intact Reasoning: Appears intact Sensation Sensation Light Touch: Appears Intact Proprioception: Appears Intact Coordination Gross Motor Movements are Fluid and Coordinated: No Fine Motor Movements are Fluid and Coordinated: No Coordination and Movement Description: dense L hemiplegia on the L. Heel Shin Test: unable to test due to strength deficits. Motor  Motor Motor: Hemiplegia;Motor impersistence Motor - Skilled Clinical Observations: Dense L hemiplegia UE>LE Mobility  Bed Mobility Bed Mobility: Supine to Sit;Sit to Supine Supine to Sit: Minimal Assistance - Patient > 75% Sit to Supine: Minimal Assistance - Patient > 75% Transfers Sit to Stand: Moderate Assistance - Patient 50-74% Stand to Sit: Moderate Assistance - Patient 50-74%  Trunk/Postural Assessment  Cervical Assessment Cervical Assessment: Within Functional Limits Thoracic Assessment Thoracic Assessment: Exceptions to WFL(rounded shoulders) Lumbar Assessment Lumbar Assessment: Exceptions to [place] Postural Control Postural Control: Deficits on evaluation Righting Reactions: limited in standing Protective Responses: delayed in standing Postural Limitations: poor awareness of LOB to the L  Balance Balance Balance Assessed: Yes Dynamic Sitting Balance Dynamic Sitting - Balance Support: During functional activity Dynamic Sitting - Level of Assistance: 5: Stand by assistance Dynamic Standing Balance Dynamic Standing - Balance Support: Bilateral upper extremity supported Dynamic Standing - Level of Assistance: 3: Mod assist Extremity/Trunk Assessment RUE Assessment RUE Assessment: Within Functional Limits LUE Assessment LUE Assessment: Exceptions to [facility] Passive Range of Motion (PROM) Comments: WFLs General Strength Comments: trace in digits, shoulder 2-/5, and triceps     Refer to Care Plan for Long Term Goals  Recommendations for other services: Neuropsych   Discharge Criteria: Patient will be  discharged from OT if patient refuses treatment 3 consecutive times without medical reason, if treatment goals not met, if there is a change in medical status, if patient makes no progress towards goals or if patient is discharged from hospital.  The above assessment, treatment plan, treatment alternatives and goals were discussed and mutually agreed upon: by patient  Gypsy Decant 03/10/2019, 12:50  PM  

## 2019-03-10 NOTE — Progress Notes (Signed)
Social Work Assessment and Plan   Patient Details  Name: Julia Banks MRN: [id number] Date of Birth: May 04, 1941  Today's Date: 03/10/2019  Problem List:  Patient Active Problem List   Diagnosis Date Noted  . Cerebrovascular accident (CVA) of right basal ganglia ([place]) 03/09/2019  . Hemiparesis affecting left side as late effect of cerebrovascular accident (CVA) ([place]) 03/09/2019  . CVA (cerebral vascular accident) ([place]) 03/08/2019  . Ischemic stroke ([place]) 03/07/2019  . CKD (chronic kidney disease), stage III ([place]) 03/07/2019  . Advanced care planning/counseling discussion 01/27/2016  . Sensorineural hearing loss (SNHL) of both ears 01/27/2016  . Chest discomfort 01/27/2016  . Insomnia 02/13/2015  . Chronic cough 10/10/2014  . Osteopenia 07/31/2014  . S/P mastectomy 06/07/2014  . Genetic testing 06/02/2014  . Breast cancer of upper-outer quadrant of left female breast ([place]) 03/28/2014  . Grieving 10/25/2013  . Secondary hyperparathyroidism of renal origin ([place])   . Medicare annual wellness visit, subsequent 06/28/2013  . Diabetes mellitus with stage 3 chronic kidney disease ([place])   . Hypothyroidism 04/13/2013  . GERD (gastroesophageal reflux disease)   . Essential hypertension, benign 09/17/2012  . HLD (hyperlipidemia) 09/17/2012  . Type 2 diabetes, uncontrolled, with neuropathy ([place]) 02/15/2012  . OSA on CPAP 02/13/2012  . Coronary artery disease 02/13/2012   Past Medical History:  Past Medical History:  Diagnosis Date  . Arthritis   . Asthma   . Breast cancer, left breast ([place]) 02/2014   DCIS, ER+ PR- initially rec bilat mastectomy with sentinal LN biopsy L axilla but now planning just L mastectomy [name])  . Chronic kidney disease, stage 3 ([place])   . Colon polyps    benign  . Complication of anesthesia    hard to wake up  . Coronary artery disease    30% stenosis reported on previous cardiac catheterization in 2006. This was done in [facility]  .  GERD (gastroesophageal reflux disease)   . Headaches, cluster   . Hyperlipidemia   . Hypertension   . Hypothyroidism   . Osteopenia 07/2014   DEXA 0.5 spine, 01.7 R femoral neck  . Pneumonia    years ago  . Secondary hyperparathyroidism of renal origin ([place])    likely  . Sensorineural hearing loss (SNHL) of both ears 01/27/2016   Audiology eval - moderately severe sensorineural hearing loss bilaterally rec amplification (07/2016 - [name] audiologist)  . Sleep apnea    on CPAP  . Thyroid disease   . Type 2 diabetes, uncontrolled, with neuropathy ([place])    followed by endo Dr Buddy Duty   Past Surgical History:  Past Surgical History:  Procedure Laterality Date  . ABDOMINAL HYSTERECTOMY     heavy bleeding, ovaries remain  . APPENDECTOMY    . BREAST BIOPSY Right    benign  . BREAST BIOPSY Left 02/2014   ?DCIS  . CARDIAC CATHETERIZATION  2006   [place], [place].   . CARDIAC CATHETERIZATION  10/2013   mild 2v nonobstructive disease, EF 60%  . CARDIOVASCULAR STRESS TEST  09/2012   WNL ([place])  . CHOLECYSTECTOMY    . COLONOSCOPY  05/2010   2 TAs, diverticulosis, rpt 2 yrs (Dr Elissa Hefty in [place])  . COLONOSCOPY  12/2014   1 TA, rpt 5 yrs Henrene Pastor)  . cortisone right knee  01/04/15  . GALLBLADDER SURGERY    . MASTECTOMY W/ SENTINEL NODE BIOPSY Left 06/07/2014   DR WAKEFIELD  . SIMPLE MASTECTOMY WITH AXILLARY SENTINEL NODE BIOPSY Left  06/07/2014   Procedure: LEFT TOTAL MASTECTOMY WITH LEFT AXILLARY SENTINEL NODE BIOPSY;  Surgeon: Rolm Bookbinder, MD;  Location: [facility];  Service: General;  Laterality: Left;   Social History:  reports that she has never smoked. She has never used smokeless tobacco. She reports that she does not drink alcohol or use drugs.  Family / Support Systems Marital Status: Widow/Widower Patient Roles: Other (Comment)(Aunt, sister in-law and church member) Other Supports: [name] Flinchum-niece [phone]  Hoyle Sauer Smith-sister [phone] Skippy  Smith-brother in-law [phone] Anticipated Caregiver: Niece, sister and brother in-law Ability/Limitations of Caregiver: Aware pt will need care at DC. Coming up with a plan Caregiver Availability: Other (Comment)(family coming up with a plan for discharge) Family Dynamics: Close knit with sister and her family, all are local and involved. She has friends and church members who are supportive and will assist her at discharge  Social History Preferred language: English Religion: Unknown Cultural Background: No issues Education: Secretary/administrator educated Read: Yes Write: Yes Employment Status: Retired Public relations account executive Issues: No issues Guardian/Conservator: None-accoridng to MD pt is capable of making her own decisions while here   Abuse/Neglect Abuse/Neglect Assessment Can Be Completed: Yes Physical Abuse: Denies Verbal Abuse: Denies Sexual Abuse: Denies Exploitation of patient/patient's resources: Denies Self-Neglect: Denies  Emotional Status Pt's affect, behavior and adjustment status: Pt has always been independent and able to take care of herself and would like to when discharged from here. Her niece is very supportive and voiced they will assist in any way that they can. Pt was drving and workuing outside what she likes to do. Recent Psychosocial Issues: other health issues were being managed by her MD's Psychiatric History: No history deferred depression screen due to pt adjusting to the new unit and just starting therapies. Do feel she would benefit from seeing neuro-psych while here. Will get input from team Substance Abuse History: No issues  Patient / Family Perceptions, Expectations & Goals Pt/Family understanding of illness & functional limitations: Pt and niece talk with the MD and feel they have a good understanding of her deficits from the stroke and her treatment plan going forward. Sheis glad to be here on rehab and feels and hopes she will do well  here. 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."  [employer]: None Premorbid Home Care/DME Agencies: None Transportation available at discharge: Pt was driving prior to admission family will until she is able Resource referrals recommended: Neuropsychology  Discharge Planning Living Arrangements: Alone Support Systems: Other relatives, Water engineer, Social worker community Type of Residence: Private residence Insurance underwriter Resources: [employer], Multimedia programmer (Engineer, agricultural) Financial Resources: Radio broadcast assistant Screen Referred: No Living Expenses: Own Money Management: Patient Does the patient have any problems obtaining your medications?: No Home Management: Self Patient/Family Preliminary Plans: Return home with family members working on a 24 hr care plan for her. Both aware she will need someone with her at discharge from rehab. Will be evaluated today and goals set to work on while here. She does not need spech so the focus can be PT & OT. She is concerned her knee buckles and was not doing this prior to CVA. Therapy to work on this.. Social Work Anticipated Follow Up Needs: HH/OP,  Support Group  Clinical Impression Very motivated female who is glad to be here on CIR. Her niece is here to see in her first day of therapies. Both are hopeful she will do well here but also realize she will need 24 hr care at discharge from rehab. Will await therapy team evaluations and work on discharge needs. Will ask input from team regarding need for neuro-psych while here. Pt is very independent and wants to regain this while here.  Elease Hashimoto 03/10/2019, 9:49 AM

## 2019-03-10 NOTE — Progress Notes (Signed)
[facility] PHYSICAL MEDICINE & REHABILITATION PROGRESS NOTE   Subjective/Complaints:  Pt leans to left , no problems with senstion noted   ROS Neg CP, SOB, N/V/D  Objective:   No results found. Recent Labs    03/09/19 1648 03/10/19 0524  WBC 7.9 7.8  HGB 11.8* 11.7*  HCT 35.1* 35.0*  PLT 162 160   Recent Labs    03/07/19 0959 03/07/19 1003 03/09/19 1648 03/10/19 0524  NA 141 140  --  137  K 4.3 4.2  --  4.1  CL 108 106  --  105  CO2 24  --   --  23  GLUCOSE 125* 120*  --  149*  BUN 16 18  --  16  CREATININE 1.53* 1.40* 1.52* 1.38*  CALCIUM 9.3  --   --  9.0    Intake/Output Summary (Last 24 hours) at 03/10/2019 0753 Last data filed at 03/09/2019 1803 Gross per 24 hour  Intake 300 ml  Output -  Net 300 ml     Physical Exam: Vital Signs Blood pressure (!) 177/68, pulse 70, temperature 98.4 F (36.9 C), resp. rate 18, height 5\' 6"  (1.676 m), weight 86 kg, SpO2 97 %.   General: No acute distress Mood and affect are appropriate Heart: Regular rate and rhythm no rubs murmurs or extra sounds Lungs: Clear to auscultation, breathing unlabored, no rales or wheezes Abdomen: Positive bowel sounds, soft nontender to palpation, nondistended Extremities: No clubbing, cyanosis, or edema Skin: No evidence of breakdown, no evidence of rash Neurologic: Cranial nerves II through XII intact, motor strength is   in  deltoid, bicep, tricep, grip, hip flexor, knee extensors, ankle dorsiflexor and plantar flexor Sensory exam normal sensation to light touch and proprioception in bilateral upper and lower extremities Cerebellar exam normal finger to nose to finger as well as heel to shin in bilateral upper and lower extremities Musculoskeletal: Full range of motion in all 4 extremities. No joint swelling   Assessment/Plan: 1. Functional deficits secondary to Left hemiparesis after Right subcortical infarct which require 3+ hours per day of interdisciplinary therapy in a  comprehensive inpatient rehab setting.  Physiatrist is providing close team supervision and 24 hour management of active medical problems listed below.  Physiatrist and rehab team continue to assess barriers to discharge/monitor patient progress toward functional and medical goals  Care Tool:  Bathing              Bathing assist       Upper Body Dressing/Undressing Upper body dressing        Upper body assist      Lower Body Dressing/Undressing Lower body dressing            Lower body assist       Toileting Toileting    Toileting assist Assist for toileting: Maximal Assistance - Patient 25 - 49%     Transfers Chair/bed transfer  Transfers assist           Locomotion Ambulation   Ambulation assist              Walk 10 feet activity   Assist           Walk 50 feet activity   Assist           Walk 150 feet activity   Assist           Walk 10 feet on uneven surface  activity   Assist  Wheelchair     Assist               Wheelchair 50 feet with 2 turns activity    Assist            Wheelchair 150 feet activity     Assist          Blood pressure (!) 177/68, pulse 70, temperature 98.4 F (36.9 C), resp. rate 18, height 5\' 6"  (1.676 m), weight 86 kg, SpO2 97 %.  Medical Problem List and Plan: 1.  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  Recommendations for 30-day cardiac event monitor PT, OT evals today 2.  Antithrombotics: -DVT/anticoagulation: Subcutaneous Lovenox             -antiplatelet therapy: Aspirin 81 mg daily, Plavix 75 mg daily x3 weeks then aspirin alone 3. Pain Management: Tylenol as needed 4. Mood: Provide emotional support             -antipsychotic agents: N/A 5. Neuropsych: This patient is capable of making decisions on her own behalf. 6. Skin/Wound Care: Routine skin checks 7.  Fluids/Electrolytes/Nutrition: Routine in and outs with follow-up chemistries 8.  Hypertension.  Permissive x 47more wk hypertension.  Patient on Lasix 20 mg daily, Cozaar 50 mg daily, Toprol-XL 50 mg daily prior to admission.  Resume as needed Vitals:   03/09/19 2107 03/10/19 0533  BP: (!) 170/60 (!) 177/68  Pulse: 72 70  Resp:  18  Temp:  98.4 F (36.9 C)  SpO2:  97%   9.  Diabetes mellitus.  Hemoglobin A1c 7.2.  Lantus insulin 40 units daily.  Check blood sugars before meals and at bedtime CBG (last 3)  Recent Labs    03/09/19 1157 03/09/19 2137 03/10/19 0641  GLUCAP 285* 335* 134*  am CBG ok monitor daytime  10.  Hypothyroidism.  Continue supplement 11.  CKD stage III.  Baseline creatinine 1.2-1.35.  Follow-up chemistries 12.  Hyperlipidemia.  Patient intolerant with pravastatin and other statins.  Follow-up outpatient with PCP    LOS: 1 days A FACE TO [place] E Elma Shands 03/10/2019, 7:53 AM

## 2019-03-10 NOTE — Plan of Care (Signed)
  Problem: Consults Goal: RH STROKE PATIENT EDUCATION Description: See Patient Education module for education specifics  Outcome: Progressing Goal: Nutrition Consult-if indicated Outcome: Progressing Goal: Diabetes Guidelines if Diabetic/Glucose > 140 Description: If diabetic or lab glucose is > 140 mg/dl - Initiate Diabetes/Hyperglycemia Guidelines & Document Interventions  Outcome: Progressing   Problem: RH BOWEL ELIMINATION Goal: RH STG MANAGE BOWEL WITH ASSISTANCE Description: STG Manage Bowel with Assistance. Outcome: Progressing Goal: RH STG MANAGE BOWEL W/MEDICATION W/ASSISTANCE Description: STG Manage Bowel with Medication with Assistance. Outcome: Progressing   Problem: RH BLADDER ELIMINATION Goal: RH STG MANAGE BLADDER WITH ASSISTANCE Description: STG Manage Bladder With Assistance Outcome: Progressing Goal: RH STG MANAGE BLADDER WITH MEDICATION WITH ASSISTANCE Description: STG Manage Bladder With Medication With Assistance. Outcome: Progressing   Problem: RH SKIN INTEGRITY Goal: RH STG SKIN FREE OF INFECTION/BREAKDOWN Outcome: Progressing Goal: RH STG MAINTAIN SKIN INTEGRITY WITH ASSISTANCE Description: STG Maintain Skin Integrity With Assistance. Outcome: Progressing Goal: RH STG ABLE TO PERFORM INCISION/WOUND CARE W/ASSISTANCE Description: STG Able To Perform Incision/Wound Care With Assistance. Outcome: Progressing   Problem: RH SAFETY Goal: RH STG ADHERE TO SAFETY PRECAUTIONS W/ASSISTANCE/DEVICE Description: STG Adhere to Safety Precautions With Assistance/Device. Outcome: Progressing Goal: RH STG DECREASED RISK OF FALL WITH ASSISTANCE Description: STG Decreased Risk of Fall With Assistance. Outcome: Progressing   Problem: RH COGNITION-NURSING Goal: RH STG USES MEMORY AIDS/STRATEGIES W/ASSIST TO PROBLEM SOLVE Description: STG Uses Memory Aids/Strategies With Assistance to Problem Solve. Outcome: Progressing Goal: RH STG ANTICIPATES NEEDS/CALLS FOR ASSIST  W/ASSIST/CUES Description: STG Anticipates Needs/Calls for Assist With Assistance/Cues. Outcome: Progressing   Problem: RH PAIN MANAGEMENT Goal: RH STG PAIN MANAGED AT OR BELOW PT'S PAIN GOAL Outcome: Progressing   Problem: RH KNOWLEDGE DEFICIT Goal: RH STG INCREASE KNOWLEDGE OF DIABETES Outcome: Progressing Goal: RH STG INCREASE KNOWLEDGE OF HYPERTENSION Outcome: Progressing Goal: RH STG INCREASE KNOWLEDGE OF DYSPHAGIA/FLUID INTAKE Outcome: Progressing Goal: RH STG INCREASE KNOWLEGDE OF HYPERLIPIDEMIA Outcome: Progressing Goal: RH STG INCREASE KNOWLEDGE OF STROKE PROPHYLAXIS Outcome: Progressing   Problem: RH Vision Goal: RH LTG Vision (Specify) Outcome: Progressing   Problem: RH Pre-functional/Other (Specify) Goal: RH LTG Pre-functional (Specify) Outcome: Progressing Goal: RH LTG Interdisciplinary (Specify) 1 Description: RH LTG Interdisciplinary (Specify)1 Outcome: Progressing Goal: RH LTG Interdisciplinary (Specify) 2 Description: RH LTG Interdisciplinary (Specify) 2  Outcome: Progressing

## 2019-03-10 NOTE — Care Management Note (Signed)
[facility] Individual Statement of Services  Patient Name:  Julia Banks  Date:  03/10/2019  Welcome to the [facility].  Our goal is to provide you with an individualized program based on your diagnosis and situation, designed to meet your specific needs.  With this comprehensive rehabilitation program, you will be expected to participate in at least 3 hours of rehabilitation therapies Monday-Friday, with modified therapy programming on the weekends.  Your rehabilitation program will include the following services:  Physical Therapy (PT), Occupational Therapy (OT), 24 hour per day rehabilitation nursing, Neuropsychology, Case Management (Social Worker), Rehabilitation Medicine, Nutrition Services and Pharmacy Services  Weekly team conferences will be held on Wednesday to discuss your progress.  Your Social Worker will talk with you frequently to get your input and to update you on team discussions.  Team conferences with you and your family in attendance may also be held.  Expected length of stay: 21-24 days Overall anticipated outcome: supervision with some cues  Depending on your progress and recovery, your program may change. Your Social Worker will coordinate services and will keep you informed of any changes. Your Social Worker's name and contact numbers are listed  below.  The following services may also be recommended but are not provided by the [facility] will be made to provide these services after discharge if needed.  Arrangements include referral to agencies that provide these services.  Your insurance has been verified to be:  [place] Your primary doctor is:  Ria Bush  Pertinent information will be shared with your doctor and your insurance company.  Social Worker:   Ovidio Kin, [place] or ([phone]  Information discussed with and copy given to patient by: Elease Hashimoto, 03/10/2019, 9:52 AM

## 2019-03-11 ENCOUNTER — Inpatient Hospital Stay (HOSPITAL_COMMUNITY): Payer: Medicare Other | Admitting: Occupational Therapy

## 2019-03-11 ENCOUNTER — Inpatient Hospital Stay (HOSPITAL_COMMUNITY): Payer: Medicare Other | Admitting: Physical Therapy

## 2019-03-11 LAB — GLUCOSE, CAPILLARY
Glucose-Capillary: 196 mg/dL — ABNORMAL HIGH (ref 70–99)
Glucose-Capillary: 271 mg/dL — ABNORMAL HIGH (ref 70–99)
Glucose-Capillary: 217 mg/dL — ABNORMAL HIGH (ref 70–99)
Glucose-Capillary: 206 mg/dL — ABNORMAL HIGH (ref 70–99)

## 2019-03-11 MED ORDER — INSULIN GLARGINE 100 UNIT/ML ~~LOC~~ SOLN
44.0000 [IU] | Freq: Every day | SUBCUTANEOUS | Status: DC
  Administered 2019-03-11 – 2019-03-12 (×2): 44 [IU] via SUBCUTANEOUS
  Filled 2019-03-11 (×3): qty 0.44

## 2019-03-11 MED ORDER — CLONIDINE HCL 0.1 MG PO TABS: 0.1000 mg | ORAL_TABLET | ORAL | Status: DC | PRN

## 2019-03-11 NOTE — Progress Notes (Signed)
Occupational Therapy Session Note  Patient Details  Name: Julia Banks MRN: [id number] Date of Birth: 1941/06/29  Today's Date: 03/11/2019 OT Individual Time: 1410-1524 OT Individual Time Calculation (min): 74 min    Short Term Goals: Week 1:  OT Short Term Goal 1 (Week 1): Pt will perform shower transfer with mod A overall. OT Short Term Goal 2 (Week 1): Pt will engage in toilet with mod A overall. OT Short Term Goal 3 (Week 1): Pt will demonstrate hemiplegic dressing technique with min A for UB.  Skilled Therapeutic Interventions/Progress Updates:    Patient in bed, alert and ready for therapy session.  She states that she is tired but eager to get to work.  Supine to/from SSP with min/mod A.  Sit pivot transfers to/from bed, w/c and mat table with min A, cues for technique.  Good seated balance at edge of mat table.  Completed NMRE activities both seated and in supine positions, left hand in flat hand splint with good tolerance - focus on proximal stability and control in various positions, AROM in gravity eliminated plane at shoulder, she is able to complete full elbow extension against gravity.  Noted pronation and trace supination, trace wrist extension, no digit mobility at this time.  Reviewed AAROM with patient's niece.  Patient returned to bed at close of session, bed alarm set and call bell in reach.    Therapy Documentation Precautions:  Precautions Precautions: Fall Precaution Comments: left hemi Restrictions Weight Bearing Restrictions: No General:   Vital Signs:  Pain: Pain Assessment Pain Scale: 0-10 Pain Score: 0-No pain   Therapy/Group: Individual Therapy  Carlos Levering 03/11/2019, 3:53 PM

## 2019-03-11 NOTE — Progress Notes (Signed)
Physical Therapy Session Note  Patient Details  Name: Julia Banks MRN: [id number] Date of Birth: [birth year]  Today's Date: 03/11/2019 PT Individual Time: 0800-0900 PT Individual Time Calculation (min): 60 min   Short Term Goals: Week 1:  PT Short Term Goal 1 (Week 1): Pt will perform min assist bed mobility PT Short Term Goal 2 (Week 1): PT will perform wc<>bed with mod assist. PT Short Term Goal 3 (Week 1): Pt will propel WC 165f with supervision assist PT Short Term Goal 4 (Week 1): Pt will ambulate 367fwith max assist of 1 and LRAD  Skilled Therapeutic Interventions/Progress Updates:   Pt received supine in bed and agreeable to PT. Supine>sit transfer with min assist and min cues for improved attention to the LLE and safety awareness. Squat pivot transfer training x 3 throughout treatment with min-mod assist and min cues for posture, set up, and improved use of the LLE  PT instructed pt in static standing balance with visual feedback from mirror 6 x 1 min, min assist overall from PT for improved activation of the LLE and awareness of L lateral lean. Reciprocal foot taps on target with BUE support on RW and mod assist from PT 4 x 10 BLE with mod assist from PT to coordination, awareness of L lateral LOB, and AD management.   Patient returned to room and [facility] in reach and all needs met.                Therapy Documentation Precautions:  Precautions Precautions: Fall Precaution Comments: left hemi Restrictions Weight Bearing Restrictions: No Pain: denies   Therapy/Group: Individual Therapy  AuLorie Phenix/05/2019, 12:08 PM

## 2019-03-11 NOTE — Progress Notes (Signed)
Physical Therapy Session Note  Patient Details  Name: Julia Banks MRN: [id number] Date of Birth: 09-04-1940  Today's Date: 03/10/2019 PT Individual Time: 1635-1730   55 min   Short Term Goals: Week 1:  PT Short Term Goal 1 (Week 1): Pt will perform min assist bed mobility PT Short Term Goal 2 (Week 1): PT will perform wc<>bed with mod assist. PT Short Term Goal 3 (Week 1): Pt will propel WC 163ft with supervision assist PT Short Term Goal 4 (Week 1): Pt will ambulate 65ft with max assist of 1 and LRAD  Skilled Therapeutic Interventions/Progress Updates:   Pt received supine in bed and agreeable to PT. Supine>sit transfer with min assist and cues for safety. Squat pivot transfer training to and from [facility] from various surfaces with mod assist overall and moderate cues for set up and safety. Sit<>stand from [facility] with mod assist x 5 throughout treatment.   Gait training with RW and L hand orthotic x 8ft with max assist to prevent L knee buckling and reduced force of Genu recurvatum.   Stair management training with max assist x 2 steps ( 3") and R rail. Max cues for step to gait pattern and decreased speed to improve safety.   WC mobility x 132ft with supervision assist using R hemi technique. Improved coordination of RUE and RLE to prevent veer to the L this PM       Therapy Documentation Precautions:  Precautions Precautions: Fall Precaution Comments: left hemi Restrictions Weight Bearing Restrictions: No Vital Signs: Therapy Vitals Temp: 98 F (36.7 C) Temp Source: Oral Pulse Rate: 72 Resp: 16 BP: (!) 189/73 Patient Position (if appropriate): Lying Oxygen Therapy SpO2: 98 % O2 Device: Room Air Pain:   denies  Therapy/Group: Individual Therapy  Lorie Phenix 03/11/2019, 7:53 AM

## 2019-03-11 NOTE — Progress Notes (Signed)
Occupational Therapy Session Note  Patient Details  Name: Julia Banks MRN: [id number] Date of Birth: 03/18/1941  Today's Date: 03/11/2019 OT Individual Time: [id number] OT Individual Time Calculation (min): 57 min   Short Term Goals: Week 1:  OT Short Term Goal 1 (Week 1): Pt will perform shower transfer with mod A overall. OT Short Term Goal 2 (Week 1): Pt will engage in toilet with mod A overall. OT Short Term Goal 3 (Week 1): Pt will demonstrate hemiplegic dressing technique with min A for UB.  Skilled Therapeutic Interventions/Progress Updates:    Pt greeted in w/c with dtr present. Requesting to shower. Received verbal order from PA to do so. Mod A for squat pivot<TTB. She required HOH to integrate L UE. Noted some proximal activation in Lt shoulder when washing unaffected side. Steady assist for dynamic sitting balance when using figure 4 position to wash her feet. She needed assist to obtain position with L LE. Mod A for squat pivot out of shower, and Max A for squat pivot<BSC over toilet towards affected side. Pt with continent bladder void. Min A for sitting balance while she completed frontal perihygiene. Dressing completed sit<stand at the sink afterwards. Pt obtained figure 4 with both LEs and supervision assist when threading LEs into underwear and pants. Mod A for dynamic standing balance due to Lt knee buckling. Mod A for elevation of LB garments on Lt side. Total A for Teds with pt able to don gripper socks herself. During grooming tasks, OT facilitated HOH to incorporate L UE. Dtr present throughout to observe. At end of session pt remained in w/c at the sink. Dtr was getting ready to fix her hair. Dtr aware that she cannot help pt with transfer needs.   Therapy Documentation Precautions:  Precautions Precautions: Fall Precaution Comments: left hemi Restrictions Weight Bearing Restrictions: No Pain: Pain Assessment Pain Scale: 0-10 Pain Score: 0-No pain ADL:      Therapy/Group: Individual Therapy  Cheyne Bungert A Lash Matulich 03/11/2019, 4:29 PM

## 2019-03-11 NOTE — Progress Notes (Signed)
[facility] PHYSICAL MEDICINE & REHABILITATION PROGRESS NOTE   Subjective/Complaints:  No issues overnite   ROS Neg CP, SOB, N/V/D  Objective:   No results found. Recent Labs    03/09/19 1648 03/10/19 0524  WBC 7.9 7.8  HGB 11.8* 11.7*  HCT 35.1* 35.0*  PLT 162 160   Recent Labs    03/09/19 1648 03/10/19 0524  NA  --  137  K  --  4.1  CL  --  105  CO2  --  23  GLUCOSE  --  149*  BUN  --  16  CREATININE 1.52* 1.38*  CALCIUM  --  9.0    Intake/Output Summary (Last 24 hours) at 03/11/2019 0748 Last data filed at 03/10/2019 1854 Gross per 24 hour  Intake 240 ml  Output -  Net 240 ml     Physical Exam: Vital Signs Blood pressure (!) 189/73, pulse 72, temperature 98 F (36.7 C), temperature source Oral, resp. rate 16, height 5\' 6"  (1.676 m), weight 86 kg, SpO2 98 %.   General: No acute distress Mood and affect are appropriate Heart: Regular rate and rhythm no rubs murmurs or extra sounds Lungs: Clear to auscultation, breathing unlabored, no rales or wheezes Abdomen: Positive bowel sounds, soft nontender to palpation, nondistended Extremities: No clubbing, cyanosis, or edema Skin: No evidence of breakdown, no evidence of rash Neurologic: Cranial nerves II through XII intact, motor strength is   in  deltoid, bicep, tricep, grip, hip flexor, knee extensors, ankle dorsiflexor and plantar flexor Sensory exam normal sensation to light touch and proprioception in bilateral upper and lower extremities Cerebellar exam normal finger to nose to finger as well as heel to shin in bilateral upper and lower extremities Musculoskeletal: Full range of motion in all 4 extremities. No joint swelling   Assessment/Plan: 1. Functional deficits secondary to Left hemiparesis after Right subcortical infarct which require 3+ hours per day of interdisciplinary therapy in a comprehensive inpatient rehab setting.  Physiatrist is providing close team supervision and 24 hour management of  active medical problems listed below.  Physiatrist and rehab team continue to assess barriers to discharge/monitor patient progress toward functional and medical goals  Care Tool:  Bathing              Bathing assist       Upper Body Dressing/Undressing Upper body dressing        Upper body assist      Lower Body Dressing/Undressing Lower body dressing            Lower body assist       Toileting Toileting    Toileting assist Assist for toileting: Maximal Assistance - Patient 25 - 49%     Transfers Chair/bed transfer  Transfers assist           Locomotion Ambulation   Ambulation assist              Walk 10 feet activity   Assist           Walk 50 feet activity   Assist           Walk 150 feet activity   Assist           Walk 10 feet on uneven surface  activity   Assist           Wheelchair     Assist               Wheelchair 50 feet with 2  turns activity    Assist            Wheelchair 150 feet activity     Assist          Blood pressure (!) 189/73, pulse 72, temperature 98 F (36.7 C), temperature source Oral, resp. rate 16, height 5\' 6"  (1.676 m), weight 86 kg, SpO2 98 %.  Medical Problem List and Plan: 1.  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  Recommendations for 30-day cardiac event monitor PT, OT CIR level  2.  Antithrombotics: -DVT/anticoagulation: Subcutaneous Lovenox             -antiplatelet therapy: Aspirin 81 mg daily, Plavix 75 mg daily x3 weeks then aspirin alone 3. Pain Management: Tylenol as needed 4. Mood: Provide emotional support             -antipsychotic agents: N/A 5. Neuropsych: This patient is capable of making decisions on her own behalf. 6. Skin/Wound Care: Routine skin checks 7. Fluids/Electrolytes/Nutrition: Routine in and outs with follow-up chemistries 8.  Hypertension.  Permissive  x 79more wk hypertension.  Patient on Lasix 20 mg daily, Cozaar 50 mg daily, Toprol-XL 50 mg daily prior to admission.  Resume as needed Vitals:   03/10/19 2040 03/11/19 0457  BP: (!) 171/53 (!) 189/73  Pulse: 70 72  Resp: 16 16  Temp: 98.1 F (36.7 C) 98 F (36.7 C)  SpO2: 99% 98%   9.  Diabetes mellitus.  Hemoglobin A1c 7.2.  Lantus insulin 40 units daily.  Check blood sugars before meals and at bedtime CBG (last 3)  Recent Labs    03/10/19 1635 03/10/19 2055 03/11/19 0639  GLUCAP 239* 279* 196*  am CBG elevated increase lantus  To 44U  10.  Hypothyroidism.  Continue supplement 11.  CKD stage III.  Baseline creatinine 1.2-1.35. at baseline enc fluid intake  12.  Hyperlipidemia.  Patient intolerant with pravastatin and other statins.  Follow-up outpatient with PCP    LOS: 2 days A FACE TO FACE EVALUATION WAS PERFORMED  Charlett Blake 03/11/2019, 7:48 AM

## 2019-03-11 NOTE — IPOC Note (Signed)
Overall Plan of Care [facility]) Patient Details Name: Julia Banks MRN: [id number] DOB: [birth year]  Admitting Diagnosis: <principal problem not specified>  Hospital Problems: Active Problems:   Cerebrovascular accident (CVA) of right basal ganglia (HCC)   Hemiparesis affecting left side as late effect of cerebrovascular accident (CVA) ([place])     Functional Problem List: Nursing Bladder, Bowel, Edema, Endurance, Medication Management, Motor, Pain, Safety, Skin Integrity  PT Balance, Edema, Endurance, Motor, Pain, Perception, Safety, Skin Integrity  OT Balance, Behavior, Cognition, Endurance, Motor, Pain, Perception, Safety  SLP    TR         Basic ADL's: OT Grooming, Bathing, Dressing, Toileting     Advanced  ADL's: OT Simple Meal Preparation     Transfers: PT Bed Mobility, Bed to Chair, Car, Furniture, Floor  OT Toilet, Tub/Shower     Locomotion: PT Ambulation, Emergency planning/management officer, Stairs     Additional Impairments: OT Fuctional Use of Upper Extremity  SLP        TR      Anticipated Outcomes Item Anticipated Outcome  Self Feeding set up A  Swallowing      Basic self-care  S overall  Toileting  S overall   Bathroom Transfers S overall  Bowel/Bladder  Patient will be continent of bowel and bladder with min assist  Transfers  Supervision assist with LRAD  Locomotion  Ambulatory with supervision assist. and Mod I WC mobility  Communication     Cognition     Pain  pain less than or equal to 4/10  Safety/Judgment  free from falls/injury and making appropriate safety decisions   Therapy Plan: PT Intensity: Minimum of 1-2 x/day ,45 to 90 minutes PT Frequency: 5 out of 7 days PT Duration Estimated Length of Stay: 21-24days OT Intensity: Minimum of 1-2 x/day, 45 to 90 minutes OT Frequency: 5 out of 7 days OT Duration/Estimated Length of Stay: 21-24 days     Due to the current state of emergency, patients may not be receiving their 3-hours of  Medicare-mandated therapy.   Team Interventions: Nursing Interventions Patient/Family Education, Bladder Management, Bowel Management, Disease Management/Prevention, Pain Management, Medication Management, Skin Care/Wound Management, Discharge Planning  PT interventions Ambulation/gait training, Discharge planning, Functional mobility training, Psychosocial support, Therapeutic Activities, Visual/perceptual remediation/compensation, Wheelchair propulsion/positioning, Therapeutic Exercise, Skin care/wound management, Neuromuscular re-education, Disease management/prevention, Training and development officer, Cognitive remediation/compensation, DME/adaptive equipment instruction, Pain management, Splinting/orthotics, UE/LE Strength taining/ROM, Community reintegration, Barrister's clerk education, IT trainer, UE/LE Coordination activities, Functional electrical stimulation  OT Interventions Training and development officer, Engineer, drilling, Patient/family education, Therapeutic Activities, Cognitive remediation/compensation, Functional electrical stimulation, Psychosocial support, Therapeutic Exercise, Community reintegration, Functional mobility training, Self Care/advanced ADL retraining, UE/LE Strength taining/ROM, Discharge planning, Neuromuscular re-education, UE/LE Coordination activities, Pain management  SLP Interventions    TR Interventions    SW/CM Interventions Discharge Planning, Psychosocial Support, Patient/Family Education   Barriers to Discharge MD  Medical stability  Nursing      PT Inaccessible home environment, Home environment access/layout    OT   none known at this time  SLP      SW       Team Discharge Planning: Destination: PT-Home ,OT- Home , SLP-  Projected Follow-up: PT-Home health PT, OT-  24 hour supervision/assistance, Home health OT, SLP-  Projected Equipment Needs: PT-Rolling walker with 5" wheels, Wheelchair (measurements), Wheelchair cushion  (measurements), OT- To be determined, SLP-  Equipment Details: PT- , OT-  Patient/family involved in discharge planning: PT- Patient, Family member/caregiver,  OT-Patient, SLP-   MD  ELOS: 10-14d Medical Rehab Prognosis:  Excellent Assessment:  78 year old right-handed female with history of hypertension, hyperlipidemia, diabetes mellitus, sensorineural hearing loss, CAD, CKD stage III.Pt presented 03/07/2019 with left side weakness as well as mild dysarthria. Cranial CT scan negative. Patient did not receive TPA. CT angiogram of head and neck with no emergent findings. MRI showed acute perforator infarct at the right basal ganglia and corona radiata. Echocardiogram with ejection fraction of [id number] normal systolic function. Neurology follow-up maintained on aspirin and Plavix for CVA prophylaxis x3 weeks then aspirin alone. Subcutaneous Lovenox for DVT prophylaxis. Plan for 30-day cardiac event monitor. Tolerating a regular diet. Speech therapy has signed off. Therapy evaluations completed with CIR recommended and patient is to be admitted for a comprehensive rehab program on 03/09/2019.   Now requiring 24/7 Rehab RN,MD, as well as CIR level PT, OT and SLP.  Treatment team will focus on ADLs and mobility with goals set at Supervision See Team Conference Notes for weekly updates to the plan of care

## 2019-03-12 ENCOUNTER — Inpatient Hospital Stay (HOSPITAL_COMMUNITY): Payer: Medicare Other | Admitting: Occupational Therapy

## 2019-03-12 ENCOUNTER — Inpatient Hospital Stay (HOSPITAL_COMMUNITY): Payer: Medicare Other | Admitting: Physical Therapy

## 2019-03-12 LAB — GLUCOSE, CAPILLARY
Glucose-Capillary: 147 mg/dL — ABNORMAL HIGH (ref 70–99)
Glucose-Capillary: 177 mg/dL — ABNORMAL HIGH (ref 70–99)
Glucose-Capillary: 316 mg/dL — ABNORMAL HIGH (ref 70–99)
Glucose-Capillary: 357 mg/dL — ABNORMAL HIGH (ref 70–99)

## 2019-03-12 NOTE — Progress Notes (Signed)
Occupational Therapy Session Note  Patient Details  Name: Julia Banks MRN: [id number] Date of Birth: Aug 31, 1940  Today's Date: 03/12/2019 OT Individual Time: 1001-1100 OT Individual Time Calculation (min): 59 min   Short Term Goals: Week 1:  OT Short Term Goal 1 (Week 1): Pt will perform shower transfer with mod A overall. OT Short Term Goal 2 (Week 1): Pt will engage in toilet with mod A overall. OT Short Term Goal 3 (Week 1): Pt will demonstrate hemiplegic dressing technique with min A for UB.  Skilled Therapeutic Interventions/Progress Updates:    Pt greeted in recliner, requesting to shower. Mod A for squat pivot<w/c. Manual facilitation for anterior weight shift due to leaning backwards during transfer. Mod A for squat pivot<TTB and then pt bathed while seated. Assist for placing L LE into figure 4 position to wash her foot. HOH to use Lt UE to wash Rt side. She leaned anterioraly for OT to complete perihygiene in the back. Noted pt bumped her head on the hand held shower hose and bled a little on her forehead. Applied pink foam dressing and notified RN. Pt with no c/o pain or irritation after bumping head. Pt then dressed sit<stand at the sink. Pt lifted L LE into figure 4 position to dress LB. Mod A for dynamic standing balance and for elevating underwear and pants over hips. Active Lt knee buckling when standing. Min vcs for recall of hemi dressing techniques overall. HOH to integrate L UE during oral care, Min A during handwashing. For remainder of session worked on exploration of proximal UE movement using arm ranger. Pt exhibits active elbow flexion/extension, shoulder flexion/extension and IR/ER with gravity minimized. Pt remained seated at the sink with niece present to fix her hair. Tx focus placed on ADL retraining, functional transfers, and Lt NMR.      Therapy Documentation Precautions:  Precautions Precautions: Fall Precaution Comments: left hemi Restrictions Weight  Bearing Restrictions: No Pain: Min pain in hip and Lt arm. Pt reports pain to be manageable during tx    ADL:       Therapy/Group: Individual Therapy  Shia Delaine A Yahel Fuston 03/12/2019, 4:08 PM

## 2019-03-12 NOTE — Progress Notes (Signed)
[place] PHYSICAL MEDICINE & REHABILITATION PROGRESS NOTE   Subjective/Complaints:  Niece at bedside.  No new issues overnight.  ROS Neg CP, SOB, N/V/D  Objective:   No results found. Recent Labs    03/09/19 1648 03/10/19 0524  WBC 7.9 7.8  HGB 11.8* 11.7*  HCT 35.1* 35.0*  PLT 162 160   Recent Labs    03/09/19 1648 03/10/19 0524  NA  --  137  K  --  4.1  CL  --  105  CO2  --  23  GLUCOSE  --  149*  BUN  --  16  CREATININE 1.52* 1.38*  CALCIUM  --  9.0    Intake/Output Summary (Last 24 hours) at 03/12/2019 1320 Last data filed at 03/12/2019 1314 Gross per 24 hour  Intake 840 ml  Output -  Net 840 ml     Physical Exam: Vital Signs Blood pressure (!) 180/72, pulse 68, temperature 98 F (36.7 C), resp. rate 18, height 5\' 6"  (1.676 m), weight 86 kg, SpO2 94 %.   General: No acute distress Mood and affect are appropriate Heart: Regular rate and rhythm no rubs murmurs or extra sounds Lungs: Clear to auscultation, breathing unlabored, no rales or wheezes Abdomen: Positive bowel sounds, soft nontender to palpation, nondistended Extremities: No clubbing, cyanosis, or edema Skin: No evidence of breakdown, no evidence of rash Neurologic: Cranial nerves II through XII intact, motor strength is 3- in left deltoid, bicep, tricep, 2- grip, 2- hip flexor, 2- knee extensors, 0 ankle dorsiflexor and plantar flexor, 5/5 on the right Sensory exam normal sensation to light touch and proprioception in bilateral upper and lower extremities Cerebellar exam normal finger to nose to finger as well as heel to shin in bilateral upper and lower extremities Musculoskeletal: Full range of motion in all 4 extremities. No joint swelling   Assessment/Plan: 1. Functional deficits secondary to Left hemiparesis after Right subcortical infarct which require 3+ hours per day of interdisciplinary therapy in a comprehensive inpatient rehab setting.  Physiatrist is providing close team  supervision and 24 hour management of active medical problems listed below.  Physiatrist and rehab team continue to assess barriers to discharge/monitor patient progress toward functional and medical goals  Care Tool:  Bathing    Body parts bathed by patient: Left arm, Chest, Abdomen, Front perineal area, Right upper leg, Left upper leg, Right lower leg, Face, Left lower leg   Body parts bathed by helper: Right arm, Buttocks     Bathing assist Assist Level: Minimal Assistance - Patient > 75%     Upper Body Dressing/Undressing Upper body dressing   What is the patient wearing?: Pull over shirt    Upper body assist Assist Level: Supervision/Verbal cueing    Lower Body Dressing/Undressing Lower body dressing      What is the patient wearing?: Underwear/pull up, Pants     Lower body assist Assist for lower body dressing: Moderate Assistance - Patient 50 - 74%     Toileting Toileting    Toileting assist Assist for toileting: Maximal Assistance - Patient 25 - 49%     Transfers Chair/bed transfer  Transfers assist     Chair/bed transfer assist level: Moderate Assistance - Patient 50 - 74%     Locomotion Ambulation   Ambulation assist      Assist level: 2 helpers   Max distance: 12   Walk 10 feet activity   Assist     Assist level: 2 helpers  Walk 50 feet activity   Assist Walk 50 feet with 2 turns activity did not occur: Safety/medical concerns         Walk 150 feet activity   Assist Walk 150 feet activity did not occur: Safety/medical concerns         Walk 10 feet on uneven surface  activity   Assist Walk 10 feet on uneven surfaces activity did not occur: Safety/medical concerns         Wheelchair     Assist   Type of Wheelchair: Manual    Wheelchair assist level: Supervision/Verbal cueing Max wheelchair distance: 150    Wheelchair 50 feet with 2 turns activity    Assist        Assist Level:  Supervision/Verbal cueing   Wheelchair 150 feet activity     Assist  Wheelchair 150 feet activity did not occur: Safety/medical concerns   Assist Level: Supervision/Verbal cueing   Blood pressure (!) 180/72, pulse 68, temperature 98 F (36.7 C), resp. rate 18, height 5\' 6"  (1.676 m), weight 86 kg, SpO2 94 %.  Medical Problem List and Plan: 1.  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  Recommendations for 30-day cardiac event monitor PT, OT CIR level  2.  Antithrombotics: -DVT/anticoagulation: Subcutaneous Lovenox             -antiplatelet therapy: Aspirin 81 mg daily, Plavix 75 mg daily x3 weeks then aspirin alone 3. Pain Management: Tylenol as needed 4. Mood: Provide emotional support             -antipsychotic agents: N/A 5. Neuropsych: This patient is capable of making decisions on her own behalf. 6. Skin/Wound Care: Routine skin checks 7. Fluids/Electrolytes/Nutrition: Routine in and outs with follow-up chemistries 8.  Hypertension.  Permissive x 69more wk hypertension.  Patient on Lasix 20 mg daily, Cozaar 50 mg daily, Toprol-XL 50 mg daily prior to admission.  Resume as needed Vitals:   03/12/19 0249 03/12/19 0754  BP: (!) 180/72   Pulse: 68   Resp: 18   Temp: 98 F (36.7 C)   SpO2: 99% 94%   9.  Diabetes mellitus.  Hemoglobin A1c 7.2.  Lantus insulin 40 units daily.  Check blood sugars before meals and at bedtime CBG (last 3)  Recent Labs    03/11/19 2134 03/12/19 0630 03/12/19 1209  GLUCAP 206* 147* 177*  am CBG elevated increase lantus  To 44U, monitor effect 10.  Hypothyroidism.  Continue supplement 11.  CKD stage III.  Baseline creatinine 1.2-1.35. at baseline enc fluid intake  12.  Hyperlipidemia.  Patient intolerant with pravastatin and other statins.  Follow-up outpatient with PCP    LOS: 3 days A FACE TO FACE EVALUATION WAS PERFORMED  Charlett Blake 03/12/2019, 1:20 PM

## 2019-03-12 NOTE — Plan of Care (Signed)
  Problem: Consults Goal: Diabetes Guidelines if Diabetic/Glucose > 140 Description: If diabetic or lab glucose is > 140 mg/dl - Initiate Diabetes/Hyperglycemia Guidelines & Document Interventions  Outcome: Progressing   Problem: RH BOWEL ELIMINATION Goal: RH STG MANAGE BOWEL WITH ASSISTANCE Description: STG Manage Bowel with Assistance. Outcome: Progressing   Problem: RH BLADDER ELIMINATION Goal: RH STG MANAGE BLADDER WITH ASSISTANCE Description: STG Manage Bladder With Assistance Outcome: Progressing   Problem: RH SKIN INTEGRITY Goal: RH STG MAINTAIN SKIN INTEGRITY WITH ASSISTANCE Description: STG Maintain Skin Integrity With Assistance. Outcome: Progressing   Problem: RH SAFETY Goal: RH STG ADHERE TO SAFETY PRECAUTIONS W/ASSISTANCE/DEVICE Description: STG Adhere to Safety Precautions With Assistance/Device. Outcome: Progressing   Problem: RH COGNITION-NURSING Goal: RH STG ANTICIPATES NEEDS/CALLS FOR ASSIST W/ASSIST/CUES Description: STG Anticipates Needs/Calls for Assist With Assistance/Cues. Outcome: Progressing

## 2019-03-12 NOTE — Progress Notes (Signed)
Physical Therapy Session Note  Patient Details  Name: Julia Banks MRN: [id number] Date of Birth: Dec 16, 1940  Today's Date: 03/12/2019 PT Individual Time: 0801-0901 PT Individual Time Calculation (min): 60 min   Short Term Goals: Week 1:  PT Short Term Goal 1 (Week 1): Pt will perform min assist bed mobility PT Short Term Goal 2 (Week 1): PT will perform wc<>bed with mod assist. PT Short Term Goal 3 (Week 1): Pt will propel WC 145ft with supervision assist PT Short Term Goal 4 (Week 1): Pt will ambulate 64ft with max assist of 1 and LRAD  Skilled Therapeutic Interventions/Progress Updates:  Pt was seen bedside in the am. Pt sitting on toilet with nursing. Pt performed toilet transfers with stedy and min A. Dependent hygiene. Pt propelled w/c to gym about 150 feet with R UE and LE with S and increased time. In gym, pt performed multiple sit to stand transfers with rolling walker and min to mod A with verbal cues. While standing worked on [employer] and LE strengthening. Pt performed step taps with L LE, 3 sets x 10 reps each. Pt also performed marching with rolling walker and min to mod A. Pt propelled w/c back to room about 150 feet with B LEs and S with verbal cues. Pt transferred w/c to recliner with mod A and verbal cues. Pt left sitting up in recliner with call bell within reach and chair alarm on.   Therapy Documentation Precautions:  Precautions Precautions: Fall Precaution Comments: left hemi Restrictions Weight Bearing Restrictions: No General:   Pain: Pain Assessment Pain Scale: 0-10 Pain Score: 0-No pain  Therapy/Group: Individual Therapy  [name] 03/12/2019, 12:07 PM

## 2019-03-12 NOTE — Progress Notes (Signed)
Physical Therapy Session Note  Patient Details  Name: Julia Banks MRN: [id number] Date of Birth: June 22, 1941  Today's Date: 03/12/2019 PT Individual Time: [id number] PT Individual Time Calculation (min): 60 min   Short Term Goals: Week 1:  PT Short Term Goal 1 (Week 1): Pt will perform min assist bed mobility PT Short Term Goal 2 (Week 1): PT will perform wc<>bed with mod assist. PT Short Term Goal 3 (Week 1): Pt will propel WC 168ft with supervision assist PT Short Term Goal 4 (Week 1): Pt will ambulate 37ft with max assist of 1 and LRAD  Skilled Therapeutic Interventions/Progress Updates:    Pt received sitting in w/c and agreeable to therapy session.  Transported to/from gym in w/c for energy conservation. Squat pivot w/c>EOM with mod assist for balance and pivoting hips. Repeated sit<>stands EOM<>no AD 3 sets to fatigue with min/mod assist for balance and mirror feedback for L lateral weight shift and improved L knee control - multimodal cuing and manual facilitation for improved anterior trunk lean followed by increased hip extension to achieve upright and decrease posterior lean upon coming to standing. Performed pre-gait training without AD targeting L LE stance phase NMR of stepping R LE forwards/backwards towards external target - mod assist for balance and guarding/blocking L knee buckling - multimodal cuing for sequencing of task. Squat pivots EOM<>w/c with cuing for sequencing and repositioning of LEs during transfer with mod assist for balance and pivoting hips. Ambulated ~74ft at hallway rail with R UE support and max assist for balance and L LE control, +2 w/c follow, - L knee hyperextension locked out during stance phase, L lateral hip shift with Trendelenburg during L LE stance, and decreased L LE foot clearance with decreased DF during swing phase. Sit>supine with mod assist for trunk descent and B LE management. Supine bridging 3 sets to fatigue (~10-12 reps each) targeting NMR  of L hip musculature to activate gluteals and hip abductors/adductors simultaneously with min assist for L knee control throughout and cuing for proper technique. Supine L LE heel slides 2 sets to fatigue targeting hip/knee flexion while maintaining hip alignment to avoid hip abduction with external rotation - min assist for knee control throughout. Supine>sit on mat with mod assist for L LE management and trunk upright. Squat pivot back to w/c as described above and transported back to room in w/c. Pt left sitting in w/c with needs in reach, seat belt alarm on, and pt's niece present.  Therapy Documentation Precautions:  Precautions Precautions: Fall Precaution Comments: left hemi Restrictions Weight Bearing Restrictions: No  Pain:   Denies pain during session.   Therapy/Group: Individual Therapy  Tawana Scale, PT, DPT 03/12/2019, 3:26 PM

## 2019-03-13 ENCOUNTER — Inpatient Hospital Stay (HOSPITAL_COMMUNITY): Payer: Medicare Other | Admitting: Occupational Therapy

## 2019-03-13 LAB — GLUCOSE, CAPILLARY
Glucose-Capillary: 160 mg/dL — ABNORMAL HIGH (ref 70–99)
Glucose-Capillary: 200 mg/dL — ABNORMAL HIGH (ref 70–99)
Glucose-Capillary: 287 mg/dL — ABNORMAL HIGH (ref 70–99)
Glucose-Capillary: 318 mg/dL — ABNORMAL HIGH (ref 70–99)

## 2019-03-13 MED ORDER — INSULIN GLARGINE 100 UNIT/ML ~~LOC~~ SOLN
47.0000 [IU] | Freq: Every day | SUBCUTANEOUS | Status: DC
  Administered 2019-03-13 – 2019-03-25 (×13): 47 [IU] via SUBCUTANEOUS
  Filled 2019-03-13 (×14): qty 0.47

## 2019-03-13 MED ORDER — LIVING WELL WITH DIABETES BOOK
Freq: Once | Status: AC
  Administered 2019-03-13: 13:00:00
  Filled 2019-03-13: qty 1

## 2019-03-13 MED ORDER — BLOOD PRESSURE CONTROL BOOK
Freq: Once | Status: AC
  Administered 2019-03-13: 13:00:00
  Filled 2019-03-13: qty 1

## 2019-03-13 NOTE — Progress Notes (Signed)
Occupational Therapy Session Note  Patient Details  Name: Julia Banks MRN: [id number] Date of Birth: July 18, 1940  Today's Date: 03/13/2019 OT Individual Time: 1116-1200 OT Individual Time Calculation (min): 44 min   Short Term Goals: Week 1:  OT Short Term Goal 1 (Week 1): Pt will perform shower transfer with mod A overall. OT Short Term Goal 2 (Week 1): Pt will engage in toilet with mod A overall. OT Short Term Goal 3 (Week 1): Pt will demonstrate hemiplegic dressing technique with min A for UB.  Skilled Therapeutic Interventions/Progress Updates:    Pt greeted in bed. Had just taken a tylenol due to feeling sore from therapies yesterday. Ready to get up for OT. Min A for supine<sit with HOB elevated. Mod A for stand pivot<w/c with Lt side supported. Started session with oral care and handwashing while seated at the sink. Worked on Lt shoulder activation when managing the faucet levers at this time when given Mod A from OT. Continued education regarding joint protection when incorporating affected UE during stated tasks. Pt then needed to use the restroom, so Mod A squat-stand pivot to the drop arm BSC. She had a continent bladder void and was able to complete perihygiene by spreading legs with 1 toilet bar lowered. She then proceeded with dressing tasks sit<stand at the sink. Wanting to change into different pajamas. Pt recalled hemi dressing strategies independently, required setup for doffing/donning overhead shirt. Figure 4 utilized for doffing and donning LB garments + footwear with supervision for balance. Mod A for sit<stand and for dynamic standing balance due to Lt knee buckling. Pt assisted with elevating underwear and pants on her Rt side. Before elevating clothing, she needed assist for perihygiene in the back due to fecal residue on seat towel. Worked on standing balance at this time using mirror for visual feedback. When pt was finished with self care, Mod A for squat  pivot<recliner. She was repositioned for comfort and left with all needs within reach.       Therapy Documentation Precautions:  Precautions Precautions: Fall Precaution Comments: left hemi Restrictions Weight Bearing Restrictions: No Vital Signs: Therapy Vitals Temp: 98.1 F (36.7 C) Temp Source: Oral Pulse Rate: 72 Resp: 18 BP: (!) 160/50 Patient Position (if appropriate): Sitting Oxygen Therapy SpO2: 98 % O2 Device: Room Air Pain: in Lt side, mostly in Lt hip. Premedicated    ADL:       Therapy/Group: Individual Therapy  Ezelle Surprenant A Darl Kuss 03/13/2019, 4:04 PM

## 2019-03-13 NOTE — Progress Notes (Signed)
[place] PHYSICAL MEDICINE & REHABILITATION PROGRESS NOTE   Subjective/Complaints:  Slept very well, has not noticed any significant spasms in the arm but has had some tremors.  Notices her arm jumps when she yawns Good appetite BM yesterday  ROS Neg CP, SOB, N/V/D  Objective:   No results found. No results for input(s): WBC, HGB, HCT, PLT in the last 72 hours. No results for input(s): NA, K, CL, CO2, GLUCOSE, BUN, CREATININE, CALCIUM in the last 72 hours.  Intake/Output Summary (Last 24 hours) at 03/13/2019 1220 Last data filed at 03/13/2019 0816 Gross per 24 hour  Intake 720 ml  Output -  Net 720 ml     Physical Exam: Vital Signs Blood pressure (!) 184/70, pulse 71, temperature 98.6 F (37 C), temperature source Oral, resp. rate 18, height 5\' 6"  (1.676 m), weight 86 kg, SpO2 98 %.   General: No acute distress Mood and affect are appropriate Heart: Regular rate and rhythm no rubs murmurs or extra sounds Lungs: Clear to auscultation, breathing unlabored, no rales or wheezes Abdomen: Positive bowel sounds, soft nontender to palpation, nondistended Extremities: No clubbing, cyanosis, or edema Skin: No evidence of breakdown, no evidence of rash Neurologic: Cranial nerves II through XII intact, motor strength is 3- in left deltoid, bicep, tricep, 2- grip, 2- hip flexor, 2- knee extensors, 0 ankle dorsiflexor and plantar flexor, 5/5 on the right Sensory exam normal sensation to light touch and proprioception in bilateral upper and lower extremities Cerebellar exam normal finger to nose to finger as well as heel to shin in bilateral upper and lower extremities Musculoskeletal: Full range of motion in all 4 extremities. No joint swelling   Assessment/Plan: 1. Functional deficits secondary to Left hemiparesis after Right subcortical infarct which require 3+ hours per day of interdisciplinary therapy in a comprehensive inpatient rehab setting.  Physiatrist is providing close  team supervision and 24 hour management of active medical problems listed below.  Physiatrist and rehab team continue to assess barriers to discharge/monitor patient progress toward functional and medical goals  Care Tool:  Bathing    Body parts bathed by patient: Left arm, Chest, Abdomen, Front perineal area, Right upper leg, Left upper leg, Right lower leg, Face, Left lower leg   Body parts bathed by helper: Right arm, Buttocks     Bathing assist Assist Level: Minimal Assistance - Patient > 75%     Upper Body Dressing/Undressing Upper body dressing   What is the patient wearing?: Pull over shirt    Upper body assist Assist Level: Supervision/Verbal cueing    Lower Body Dressing/Undressing Lower body dressing      What is the patient wearing?: Underwear/pull up, Pants     Lower body assist Assist for lower body dressing: Moderate Assistance - Patient 50 - 74%     Toileting Toileting    Toileting assist Assist for toileting: Maximal Assistance - Patient 25 - 49%     Transfers Chair/bed transfer  Transfers assist     Chair/bed transfer assist level: Moderate Assistance - Patient 50 - 74%     Locomotion Ambulation   Ambulation assist      Assist level: 2 helpers(max A and +2 w/c follow) Assistive device: Other (comment)(hallway rail) Max distance: 55ft   Walk 10 feet activity   Assist     Assist level: 2 helpers(max A and +2 w/c follow) Assistive device: Other (comment)(hallway rail)   Walk 50 feet activity   Assist Walk 50 feet with 2 turns  activity did not occur: Safety/medical concerns         Walk 150 feet activity   Assist Walk 150 feet activity did not occur: Safety/medical concerns         Walk 10 feet on uneven surface  activity   Assist Walk 10 feet on uneven surfaces activity did not occur: Safety/medical concerns         Wheelchair     Assist   Type of Wheelchair: Manual    Wheelchair assist level:  Supervision/Verbal cueing Max wheelchair distance: 150    Wheelchair 50 feet with 2 turns activity    Assist        Assist Level: Supervision/Verbal cueing   Wheelchair 150 feet activity     Assist  Wheelchair 150 feet activity did not occur: Safety/medical concerns   Assist Level: Supervision/Verbal cueing   Blood pressure (!) 184/70, pulse 71, temperature 98.6 F (37 C), temperature source Oral, resp. rate 18, height 5\' 6"  (1.676 m), weight 86 kg, SpO2 98 %.  Medical Problem List and Plan: 1.  Left-sided weakness secondary to acute perforator infarct at the right basal ganglia corona radiata onset 03/07/2019.  Recommendations for 30-day cardiac event monitor PT, OT CIR  2.  Antithrombotics: -DVT/anticoagulation: Subcutaneous Lovenox             -antiplatelet therapy: Aspirin 81 mg daily, Plavix 75 mg daily x3 weeks then aspirin alone 3. Pain Management: Tylenol as needed 4. Mood: Provide emotional support             -antipsychotic agents: N/A 5. Neuropsych: This patient is capable of making decisions on her own behalf. 6. Skin/Wound Care: Routine skin checks 7. Fluids/Electrolytes/Nutrition: Routine in and outs with follow-up chemistries 8.  Hypertension.    Will start to tighten control later this week Patient on Lasix 20 mg daily, Cozaar 50 mg daily, Toprol-XL 50 mg daily prior to admission.  Resume as needed Vitals:   03/13/19 0522 03/13/19 0810  BP: (!) 184/70   Pulse: 71   Resp: 18   Temp: 98.6 F (37 C)   SpO2: 98% 98%   9.  Diabetes mellitus.  Hemoglobin A1c 7.2.  Lantus insulin 40 units daily.  Check blood sugars before meals and at bedtime CBG (last 3)  Recent Labs    03/12/19 1705 03/12/19 2113 03/13/19 0610  GLUCAP 316* 357* 160*  am CBG elevated with Lantus 44U, monitor effect, home dose was 50 units, will increase to 47 units today 10.  Hypothyroidism.  Continue supplement 11.  CKD stage III.  Baseline creatinine 1.2-1.35. at baseline enc  fluid intake  12.  Hyperlipidemia.  Patient intolerant with pravastatin and other statins.  Follow-up outpatient with PCP    LOS: 4 days A FACE TO FACE EVALUATION WAS PERFORMED  Charlett Blake 03/13/2019, 12:20 PM

## 2019-03-14 ENCOUNTER — Inpatient Hospital Stay (HOSPITAL_COMMUNITY): Payer: Medicare Other | Admitting: Physical Therapy

## 2019-03-14 ENCOUNTER — Inpatient Hospital Stay (HOSPITAL_COMMUNITY): Payer: Medicare Other | Admitting: Occupational Therapy

## 2019-03-14 LAB — GLUCOSE, CAPILLARY
Glucose-Capillary: 137 mg/dL — ABNORMAL HIGH (ref 70–99)
Glucose-Capillary: 131 mg/dL — ABNORMAL HIGH (ref 70–99)
Glucose-Capillary: 156 mg/dL — ABNORMAL HIGH (ref 70–99)
Glucose-Capillary: 186 mg/dL — ABNORMAL HIGH (ref 70–99)

## 2019-03-14 NOTE — Progress Notes (Signed)
Occupational Therapy Session Note  Patient Details  Name: Julia Banks MRN: [id number] Date of Birth: 1940/10/06  Today's Date: 03/14/2019 OT Individual Time: [id number] OT Individual Time Calculation (min): 57 min   Short Term Goals: Week 1:  OT Short Term Goal 1 (Week 1): Pt will perform shower transfer with mod A overall. OT Short Term Goal 2 (Week 1): Pt will engage in toilet with mod A overall. OT Short Term Goal 3 (Week 1): Pt will demonstrate hemiplegic dressing technique with min A for UB.  Skilled Therapeutic Interventions/Progress Updates:    Pt greeted in bed, asleep and easily woken. Premedicated for Lt sided soreness/hip pain. Wanting to shower this AM. Supine<sit completed with HOB elevated and supervision. Max A for stand pivot<w/c going towards Lt side. Mod A for squat pivot TTB transfer using grab bar. Pt then bathed while seated, able to complete perihygiene thoroughly by leaning forward with Min A for sitting balance and facilitation for L LE placement. Close supervision for washing feet using figure 4 position bilaterally and HOH to use Lt arm during bathing. Pt still has proximal activation in shoulder but no active movement noted distally. Dressing completed w/c level at sink, sit<stand afterwards. Independent with recall of hemi dressing strategies and for problem solving clothing orientation. Mod A for dynamic standing balance due to Lt knee buckling. She needed assist for elevating clothing on Lt side. Mod A for squat pivot<drop arm BSC over toilet. Pt with continent bladder void and she completed perihygiene in the front with toilet bar lowered and Min A for sitting balance. While sitting at the sink, worked on proximal activation of L UE when managing faucet levers during handwashing and oral care. Pt opted to remain in the w/c at end of session. She was left with all needs within reach, arm trough, and safety belt fastened.     Therapy Documentation Precautions:   Precautions Precautions: Fall Precaution Comments: left hemi Restrictions Weight Bearing Restrictions: No Vital Signs: Therapy Vitals Temp: 98 F (36.7 C) Temp Source: Oral Pulse Rate: 72 Resp: 18 BP: (!) 162/62 Patient Position (if appropriate): Lying Oxygen Therapy SpO2: 99 % O2 Device: Room Air Pain: Pain Assessment Pain Scale: 0-10 Pain Score: 0-No pain ADL:       Therapy/Group: Individual Therapy  Lenvil Swaim A Lizmarie Witters 03/14/2019, 4:17 PM

## 2019-03-14 NOTE — Progress Notes (Signed)
PHYSICAL MEDICINE & REHABILITATION PROGRESS NOTE   Subjective/Complaints:  Pt reports she just wishes her L side would get stronger- d/w PT about possibly doing Estim.  No other complaints.  ROS Neg CP, SOB, N/V/D  Objective:   No results found. No results for input(s): WBC, HGB, HCT, PLT in the last 72 hours. No results for input(s): NA, K, CL, CO2, GLUCOSE, BUN, CREATININE, CALCIUM in the last 72 hours.  Intake/Output Summary (Last 24 hours) at 03/14/2019 1033 Last data filed at 03/14/2019 0804 Gross per 24 hour  Intake 720 ml  Output -  Net 720 ml     Physical Exam: Vital Signs Blood pressure (!) 178/70, pulse 66, temperature 97.8 F (36.6 C), temperature source Oral, resp. rate 17, height 5\' 6"  (1.676 m), weight 86 kg, SpO2 97 %.  Reviewed vitals General: No acute distress; sitting up in manual w/c in room; PT at bedside, NAD Mood and affect are appropriate Heart: Regular rate and rhythm no rubs murmurs or extra sounds Lungs: Clear to auscultation, breathing unlabored, no rales or wheezes Abdomen: Positive bowel sounds, soft nontender to palpation, nondistended Extremities: No clubbing, cyanosis, or edema Skin: No evidence of breakdown, no evidence of rash Neurologic: Cranial nerves II through XII intact, motor strength is 3- in left deltoid, bicep, tricep, 2- grip, 2- hip flexor, 2- knee extensors, 0 ankle dorsiflexor and plantar flexor, 5/5 on the right Sensory exam normal sensation to light touch and proprioception in bilateral upper and lower extremities Cerebellar exam normal finger to nose to finger as well as heel to shin in bilateral upper and lower extremities Musculoskeletal: Full range of motion in all 4 extremities. No joint swelling Flaccid LUE and lesser, LLE   Assessment/Plan: 1. Functional deficits secondary to Left hemiparesis after Right subcortical infarct which require 3+ hours per day of interdisciplinary therapy in a comprehensive  inpatient rehab setting.  Physiatrist is providing close team supervision and 24 hour management of active medical problems listed below.  Physiatrist and rehab team continue to assess barriers to discharge/monitor patient progress toward functional and medical goals  Care Tool:  Bathing    Body parts bathed by patient: Left arm, Chest, Abdomen, Front perineal area, Right upper leg, Left upper leg, Right lower leg, Face, Left lower leg, Buttocks   Body parts bathed by helper: Right arm     Bathing assist Assist Level: Minimal Assistance - Patient > 75%     Upper Body Dressing/Undressing Upper body dressing   What is the patient wearing?: Pull over shirt    Upper body assist Assist Level: Set up assist    Lower Body Dressing/Undressing Lower body dressing      What is the patient wearing?: Underwear/pull up, Pants     Lower body assist Assist for lower body dressing: Moderate Assistance - Patient 50 - 74%     Toileting Toileting    Toileting assist Assist for toileting: Maximal Assistance - Patient 25 - 49%     Transfers Chair/bed transfer  Transfers assist     Chair/bed transfer assist level: Moderate Assistance - Patient 50 - 74%     Locomotion Ambulation   Ambulation assist      Assist level: 2 helpers(max A and +2 w/c follow) Assistive device: Other (comment)(hallway rail) Max distance: 23ft   Walk 10 feet activity   Assist     Assist level: 2 helpers(max A and +2 w/c follow) Assistive device: Other (comment)(hallway rail)   Walk 50 feet  activity   Assist Walk 50 feet with 2 turns activity did not occur: Safety/medical concerns         Walk 150 feet activity   Assist Walk 150 feet activity did not occur: Safety/medical concerns         Walk 10 feet on uneven surface  activity   Assist Walk 10 feet on uneven surfaces activity did not occur: Safety/medical concerns         Wheelchair     Assist   Type of  Wheelchair: Manual    Wheelchair assist level: Supervision/Verbal cueing Max wheelchair distance: 150    Wheelchair 50 feet with 2 turns activity    Assist        Assist Level: Supervision/Verbal cueing   Wheelchair 150 feet activity     Assist  Wheelchair 150 feet activity did not occur: Safety/medical concerns   Assist Level: Supervision/Verbal cueing   Blood pressure (!) 178/70, pulse 66, temperature 97.8 F (36.6 C), temperature source Oral, resp. rate 17, height 5\' 6"  (1.676 m), weight 86 kg, SpO2 97 %.  Medical Problem List and Plan: 1.  Left-sided weakness secondary to acute perforator infarct at the right basal ganglia corona radiata onset 03/07/2019.  Recommendations for 30-day cardiac event monitor PT, OT CIR  2.  Antithrombotics: -DVT/anticoagulation: Subcutaneous Lovenox             -antiplatelet therapy: Aspirin 81 mg daily, Plavix 75 mg daily x3 weeks then aspirin alone 3. Pain Management: Tylenol as needed 4. Mood: Provide emotional support             -antipsychotic agents: N/A 5. Neuropsych: This patient is capable of making decisions on her own behalf. 6. Skin/Wound Care: Routine skin checks 7. Fluids/Electrolytes/Nutrition: Routine in and outs with follow-up chemistries 8.  Hypertension.    Will start to tighten control later this week Patient on Lasix 20 mg daily, Cozaar 50 mg daily, Toprol-XL 50 mg daily prior to admission.  Resume as needed Vitals:   03/14/19 0503 03/14/19 0732  BP: (!) 178/70   Pulse: 66   Resp: 17   Temp: 97.8 F (36.6 C)   SpO2: 97% 97%   9.  Diabetes mellitus.  Hemoglobin A1c 7.2.  Lantus insulin 40 units daily.  Check blood sugars before meals and at bedtime CBG (last 3)  Recent Labs    03/13/19 1715 03/13/19 2103 03/14/19 0621  GLUCAP 287* 318* 137*  am CBG elevated with Lantus 44U, monitor effect, home dose was 50 units, will increase to 47 units today- done 9/13, so wait to change  10.  Hypothyroidism.   Continue supplement 11.  CKD stage III.  Baseline creatinine 1.2-1.35. at baseline; enc fluid intake  12.  Hyperlipidemia.  Patient intolerant with pravastatin and other statins.  Follow-up outpatient with PCP    LOS: 5 days A FACE TO FACE EVALUATION WAS PERFORMED  Julia Banks 03/14/2019, 10:33 AM

## 2019-03-14 NOTE — Progress Notes (Signed)
Occupational Therapy Session Note  Patient Details  Name: Julia Banks MRN: [id number] Date of Birth: [birth year]  Today's Date: 03/14/2019 OT Individual Time: 1300-1400 OT Individual Time Calculation (min): 60 min    Short Term Goals: Week 1:  OT Short Term Goal 1 (Week 1): Pt will perform shower transfer with mod A overall. OT Short Term Goal 2 (Week 1): Pt will engage in toilet with mod A overall. OT Short Term Goal 3 (Week 1): Pt will demonstrate hemiplegic dressing technique with min A for UB.  Skilled Therapeutic Interventions/Progress Updates:    Patient seated in w/c and ready for therapy session.  Sit to/from stand in stedy for toileting with min A.  Pants up/down max A, hygiene supervision.   Sit pivot transfer to/from w/c, mat table and bed with min/mod A and cues for positioning and weight shift.  Completed core mobility, trunk/posture exercises, left UE motor control activities both seated and in supine positions on mat table - noted increased proximal control of left UE.  SSP to/from supine with mod a on mat table and bed.  Patient returned to bed at close of session, bed alarm set and call bell in reach.    Therapy Documentation Precautions:  Precautions Precautions: Fall Precaution Comments: left hemi Restrictions Weight Bearing Restrictions: No General:   Vital Signs: Therapy Vitals Temp: 98 F (36.7 C) Temp Source: Oral Pulse Rate: 72 Resp: 18 BP: (!) 162/62 Patient Position (if appropriate): Lying Oxygen Therapy SpO2: 99 % O2 Device: Room Air Pain: Pain Assessment Pain Scale: 0-10 Pain Score: 0-No pain   Other Treatments:     Therapy/Group: Individual Therapy  Carlos Levering 03/14/2019, 4:12 PM

## 2019-03-15 ENCOUNTER — Inpatient Hospital Stay (HOSPITAL_COMMUNITY): Payer: Medicare Other | Admitting: Occupational Therapy

## 2019-03-15 ENCOUNTER — Inpatient Hospital Stay (HOSPITAL_COMMUNITY): Payer: Medicare Other | Admitting: Physical Therapy

## 2019-03-15 LAB — GLUCOSE, CAPILLARY
Glucose-Capillary: 68 mg/dL — ABNORMAL LOW (ref 70–99)
Glucose-Capillary: 87 mg/dL (ref 70–99)
Glucose-Capillary: 114 mg/dL — ABNORMAL HIGH (ref 70–99)
Glucose-Capillary: 188 mg/dL — ABNORMAL HIGH (ref 70–99)
Glucose-Capillary: 293 mg/dL — ABNORMAL HIGH (ref 70–99)

## 2019-03-15 NOTE — Significant Event (Signed)
Hypoglycemic Event  CBG: 68  Treatment: 4 oz juice/soda  Symptoms: None  Follow-up CBG: [id number] CBG Result:87  Possible Reasons for Event: Unknown  Comments/MD notified:Dan Angiulli-PA    Sallye Lat

## 2019-03-15 NOTE — Progress Notes (Signed)
Physical Therapy Session Note  Patient Details  Name: Julia Banks MRN: [id number] Date of Birth: 07-Aug-1940  Today's Date: 03/15/2019 PT Individual Time: 0907-1020 PT Individual Time Calculation (min): 73 min   Short Term Goals: Week 1:  PT Short Term Goal 1 (Week 1): Pt will perform min assist bed mobility PT Short Term Goal 2 (Week 1): PT will perform wc<>bed with mod assist. PT Short Term Goal 3 (Week 1): Pt will propel WC 179f with supervision assist PT Short Term Goal 4 (Week 1): Pt will ambulate 370fwith max assist of 1 and LRAD  Skilled Therapeutic Interventions/Progress Updates: Pt presented in w/c agreeable to therapy. Pt indicating some "soreness" in L arm/leg but does not want medical intervention. Pt propelled w/c to rehab gym supervision A with good safety and no rest breaks required. Performed squat pivot transfer to mat min/modA and participated in seated kicks with ball for forced use of LLE. Performed STS 2 x 5 for BLE strengthening. Performed standing toe taps to 2in step for forced use of LLE with seated break between for recovery. Performed alternating toe taps to 2in step for wt shifting and forced use of LLE to fatigue. Participated in x 2 bouts of horseshoes with RUE with bias for reaching to L to encourage wt shifting on L side. Pt noted to have improved tolerance to wt bearing on L with activity. Performed squat pivot to w/c with modA and pt transported to wall rail. Pt participated in gait training x 5 ft with maxA, pt was able to advance LLE however when attempting to advance RLE noted to have poor ankle stability and required max multimodal cues for sequencing. Pt transported back to room at end of session and remained in w/c. Pt left with belt alarm on, call bell within reach and needs met.      Therapy Documentation Precautions:  Precautions Precautions: Fall Precaution Comments: left hemi Restrictions Weight Bearing Restrictions: No General:   Vital  Signs:  Pain: Pain Assessment Pain Scale: 0-10 Pain Score: 4  Pain Type: Acute pain Pain Location: Generalized Pain Intervention(s): Other (Comment)(premedicated)   Therapy/Group: Individual Therapy  Zylie Mumaw  Ndidi Nesby, PTA  03/15/2019, 12:31 PM

## 2019-03-15 NOTE — Progress Notes (Signed)
Occupational Therapy Session Note  Patient Details  Name: Julia Banks MRN: [id number] Date of Birth: 18-Aug-1940  Today's Date: 03/15/2019 OT Individual Time: 1130-1155 OT Individual Time Calculation (min): 25 min    Short Term Goals: Week 1:  OT Short Term Goal 1 (Week 1): Pt will perform shower transfer with mod A overall. OT Short Term Goal 2 (Week 1): Pt will engage in toilet with mod A overall. OT Short Term Goal 3 (Week 1): Pt will demonstrate hemiplegic dressing technique with min A for UB.  Skilled Therapeutic Interventions/Progress Updates:    NMR with focus on left UE proximally. Pt with weak grasp and no isolated finger movement detected in this session. Addressed scapular stability activities against gravity as well as with  Slight resistance. Also used the UE Ranger to focus on scapular stability proximal to distal with holding positions against gravity, initiation of shoulder adduction/ abduction with control, forward pushing and retraction with control all with upright unsupported sitting.   Therapy Documentation Precautions:  Precautions Precautions: Fall Precaution Comments: left hemi Restrictions Weight Bearing Restrictions: No General:   Vital Signs: Therapy Vitals Temp: 97.9 F (36.6 C) Temp Source: Oral Pulse Rate: 67 Resp: 18 BP: (!) 162/53 Patient Position (if appropriate): Lying Oxygen Therapy SpO2: 98 % O2 Device: Room Air Pain:  no c/o pain in session    Therapy/Group: Individual Therapy  Willeen Cass [facility] 03/15/2019, 4:02 PM

## 2019-03-15 NOTE — Progress Notes (Signed)
Occupational Therapy Session Note  Patient Details  Name: Julia Banks MRN: [id number] Date of Birth: [birth year]  Today's Date: 03/15/2019 OT Individual Time: 0705-0800 and 1530-1608 OT Individual Time Calculation (min): 55 min and 38 mins   Short Term Goals: Week 1:  OT Short Term Goal 1 (Week 1): Pt will perform shower transfer with mod A overall. OT Short Term Goal 2 (Week 1): Pt will engage in toilet with mod A overall. OT Short Term Goal 3 (Week 1): Pt will demonstrate hemiplegic dressing technique with min A for UB.  Skilled Therapeutic Interventions/Progress Updates:    Session 1: Upon entering the room, pt supine in bed. Pt recently had hypoglycemic event but just finished breakfast and reports feeling better. Pt declined bathing and dressing this session. Supine >sit with min A to EOB.  Pt donning B socks with figure four position with steady assistance for balance. Mod A stand pivot transfer into wheelchair. Pt seated at sink for grooming tasks with min cuing for technique and supervision overall. OT discussed home set up and pt texting family for measurements and pictures in order to determine needed equipment. Pt is unsure if bed will be too high or not. OT educating pt on self ROM for L UE with cuing to slow down movement and continue to attempt to activate muscles with movement as well. Pt remained in wheelchair with chair alarm belt donned and call bell within reach.   Session 2: Upon entering the room, pt supine in bed and sleeping soundly in bed. Pt with no c/o pain and agreeable to OT intervention. Pt performed supine >sit with min guard and use of bed rails with HOB elevated. Pt transferred into wheelchair with mod stand pivot transfer. OT assisted pt to ADL apartment for time management. Pt verbalized standard bed in apartment similar height to home (28 inches). Pt transferred to the R and onto bed with mod A stand pivot transfer and min sit >supine. Pt required mod cuing  and min A for supine >sit from flat bed. Pt returning to wheelchair and then transferred into recliner chair in same manner. OT assisted pt back to room via wheelchair and use of stedy for toileting needs. Pt needing assistance with clothing management and hygiene with standing balance being min A and sit <>stand mod A. NT assisting pt into recliner chair at end of session. All needs within reach.   Therapy Documentation Precautions:  Precautions Precautions: Fall Precaution Comments: left hemi Restrictions Weight Bearing Restrictions: No   Pain: Pain Assessment Pain Scale: 0-10 Pain Score: 4  Pain Type: Acute pain Pain Location: Generalized Pain Intervention(s): Other (Comment)(premedicated)   Therapy/Group: Individual Therapy  Gypsy Decant 03/15/2019, 12:30 PM

## 2019-03-16 ENCOUNTER — Inpatient Hospital Stay (HOSPITAL_COMMUNITY): Payer: Medicare Other | Admitting: Physical Therapy

## 2019-03-16 ENCOUNTER — Inpatient Hospital Stay (HOSPITAL_COMMUNITY): Payer: Medicare Other | Admitting: Occupational Therapy

## 2019-03-16 LAB — CREATININE, SERUM
Creatinine, Ser: 1.27 mg/dL — ABNORMAL HIGH (ref 0.44–1.00)
GFR calc Af Amer: 47 mL/min — ABNORMAL LOW (ref >60)
GFR calc non Af Amer: 41 mL/min — ABNORMAL LOW (ref >60)

## 2019-03-16 LAB — GLUCOSE, CAPILLARY
Glucose-Capillary: 121 mg/dL — ABNORMAL HIGH (ref 70–99)
Glucose-Capillary: 142 mg/dL — ABNORMAL HIGH (ref 70–99)
Glucose-Capillary: 179 mg/dL — ABNORMAL HIGH (ref 70–99)
Glucose-Capillary: 290 mg/dL — ABNORMAL HIGH (ref 70–99)

## 2019-03-16 MED ORDER — GLIMEPIRIDE 2 MG PO TABS
1.0000 mg | ORAL_TABLET | Freq: Every day | ORAL | Status: DC
  Administered 2019-03-17 – 2019-03-21 (×5): 1 mg via ORAL
  Filled 2019-03-16 (×5): qty 1

## 2019-03-16 MED ORDER — LOSARTAN POTASSIUM 50 MG PO TABS
25.0000 mg | ORAL_TABLET | Freq: Every day | ORAL | Status: DC
  Administered 2019-03-17 – 2019-03-19 (×3): 25 mg via ORAL
  Filled 2019-03-16 (×3): qty 1

## 2019-03-16 NOTE — Progress Notes (Signed)
Occupational Therapy Session Note  Patient Details  Name: Julia Banks MRN: [id number] Date of Birth: May 15, 1941  Today's Date: 03/16/2019 OT Individual Time: 1330-1430 OT Individual Time Calculation (min): 60 min    Short Term Goals: Week 1:  OT Short Term Goal 1 (Week 1): Pt will perform shower transfer with mod A overall. OT Short Term Goal 2 (Week 1): Pt will engage in toilet with mod A overall. OT Short Term Goal 3 (Week 1): Pt will demonstrate hemiplegic dressing technique with min A for UB.  Skilled Therapeutic Interventions/Progress Updates:    patient seated in w/c, ready for therapy session.  Sit to stand in stedy CGA - used stedy for access to toilet, max A for pants up/down and hygiene.  Sit pivot transfer to/from w/c, mat table and bed min A with min cues for technique.  Bed mobility:  SSP to/from supine on mat table and bed with min/mod a.  Completed UB NMRE activities in stance, reclined and SSP with noted improved proximal control and trace at hand.  Utilized flat hand paddle to facilitate proximal control with good results.  Sit to stand at edge of mat with CGA - right hand support, min a to maintain standing with L UB activity 2 x 3-4 minutes.  Patient returned to bed at close of session, bed alarm set and call bell/tray table in reach.    Therapy Documentation Precautions:  Precautions Precautions: Fall Precaution Comments: left hemi Restrictions Weight Bearing Restrictions: No General:   Vital Signs: Therapy Vitals Temp: 98.8 F (37.1 C) Temp Source: Oral Pulse Rate: 71 Resp: 18 BP: (!) 146/88 Patient Position (if appropriate): Sitting Oxygen Therapy SpO2: 98 % O2 Device: Nasal Cannula Pain: Pain Assessment Pain Scale: 0-10 Pain Score: 0-No pain   Other Treatments:     Therapy/Group: Individual Therapy  Carlos Levering 03/16/2019, 3:12 PM

## 2019-03-16 NOTE — Progress Notes (Signed)
[facility] PHYSICAL MEDICINE & REHABILITATION PROGRESS NOTE   Subjective/Complaints:  Patient has no bowel or bladder issues.  Feels like she is participating well.  Starting to move her hand and leg a little bit better  ROS Neg CP, SOB, N/V/D  Objective:   No results found. No results for input(s): WBC, HGB, HCT, PLT in the last 72 hours. Recent Labs    03/16/19 0608  CREATININE 1.27*    Intake/Output Summary (Last 24 hours) at 03/16/2019 0950 Last data filed at 03/16/2019 0804 Gross per 24 hour  Intake 720 ml  Output -  Net 720 ml     Physical Exam: Vital Signs Blood pressure (!) 158/57, pulse 74, temperature 97.7 F (36.5 C), temperature source Oral, resp. rate 16, height 5\' 6"  (1.676 m), weight 86 kg, SpO2 95 %.   General: No acute distress Mood and affect are appropriate Heart: Regular rate and rhythm no rubs murmurs or extra sounds Lungs: Clear to auscultation, breathing unlabored, no rales or wheezes Abdomen: Positive bowel sounds, soft nontender to palpation, nondistended Extremities: No clubbing, cyanosis, or edema Skin: No evidence of breakdown, no evidence of rash Neurologic: Cranial nerves II through XII intact, motor strength is 3- in left deltoid, bicep, tricep, 2- grip, 2- finger extensors, 2- hip flexor, 2- knee extensors, 0 ankle dorsiflexor and plantar flexor, 5/5 on the right Sensory exam normal sensation to light touch and proprioception in bilateral upper and lower extremities Cerebellar exam normal finger to nose to finger as well as heel to shin in bilateral upper and lower extremities Musculoskeletal: Full range of motion in all 4 extremities. No joint swelling   Assessment/Plan: 1. Functional deficits secondary to Left hemiparesis after Right subcortical infarct which require 3+ hours per day of interdisciplinary therapy in a comprehensive inpatient rehab setting.  Physiatrist is providing close team supervision and 24 hour management of active  medical problems listed below.  Physiatrist and rehab team continue to assess barriers to discharge/monitor patient progress toward functional and medical goals  Care Tool:  Bathing    Body parts bathed by patient: Left arm, Chest, Abdomen, Front perineal area, Right upper leg, Left upper leg, Right lower leg, Face, Left lower leg, Buttocks   Body parts bathed by helper: Right arm     Bathing assist Assist Level: Minimal Assistance - Patient > 75%     Upper Body Dressing/Undressing Upper body dressing   What is the patient wearing?: Pull over shirt    Upper body assist Assist Level: Set up assist    Lower Body Dressing/Undressing Lower body dressing      What is the patient wearing?: Underwear/pull up, Pants     Lower body assist Assist for lower body dressing: Moderate Assistance - Patient 50 - 74%     Toileting Toileting    Toileting assist Assist for toileting: Maximal Assistance - Patient 25 - 49%     Transfers Chair/bed transfer  Transfers assist     Chair/bed transfer assist level: Moderate Assistance - Patient 50 - 74%     Locomotion Ambulation   Ambulation assist      Assist level: 2 helpers(max A and +2 w/c follow) Assistive device: Other (comment)(hallway rail) Max distance: 54ft   Walk 10 feet activity   Assist     Assist level: 2 helpers(max A and +2 w/c follow) Assistive device: Other (comment)(hallway rail)   Walk 50 feet activity   Assist Walk 50 feet with 2 turns activity did not occur:  Safety/medical concerns         Walk 150 feet activity   Assist Walk 150 feet activity did not occur: Safety/medical concerns         Walk 10 feet on uneven surface  activity   Assist Walk 10 feet on uneven surfaces activity did not occur: Safety/medical concerns         Wheelchair     Assist   Type of Wheelchair: Manual    Wheelchair assist level: Supervision/Verbal cueing Max wheelchair distance: 150     Wheelchair 50 feet with 2 turns activity    Assist        Assist Level: Supervision/Verbal cueing   Wheelchair 150 feet activity     Assist  Wheelchair 150 feet activity did not occur: Safety/medical concerns   Assist Level: Supervision/Verbal cueing   Blood pressure (!) 158/57, pulse 74, temperature 97.7 F (36.5 C), temperature source Oral, resp. rate 16, height 5\' 6"  (1.676 m), weight 86 kg, SpO2 95 %.  Medical Problem List and Plan: 1.  Left-sided weakness secondary to acute perforator infarct at the right basal ganglia corona radiata onset 03/07/2019.  Recommendations for 30-day cardiac event monitor PT, OT CIR  2.  Antithrombotics: -DVT/anticoagulation: Subcutaneous Lovenox             -antiplatelet therapy: Aspirin 81 mg daily, Plavix 75 mg daily x3 weeks then aspirin alone 3. Pain Management: Tylenol as needed 4. Mood: Provide emotional support             -antipsychotic agents: N/A 5. Neuropsych: This patient is capable of making decisions on her own behalf. 6. Skin/Wound Care: Routine skin checks 7. Fluids/Electrolytes/Nutrition: Routine in and outs with follow-up chemistries 8.  Hypertension.    Will start to tighten control later this week Patient on Lasix 20 mg daily, Cozaar 50 mg daily, Toprol-XL 50 mg daily prior to admission.  Resume as needed Vitals:   03/16/19 0445 03/16/19 0743  BP: (!) 158/57   Pulse: 69 74  Resp: 14 16  Temp: 97.7 F (36.5 C)   SpO2: 98% 95%  Resume Cozaar tomorrow 9.  Diabetes mellitus.  Hemoglobin A1c 7.2.  Lantus insulin 40 units daily.  Check blood sugars before meals and at bedtime CBG (last 3)  Recent Labs    03/15/19 1632 03/15/19 2126 03/16/19 0603  GLUCAP 188* 293* 121*  am CBG controlled with Lantus  47 units today, add glimepiride for daytime and evening sugars  10.  Hypothyroidism.  Continue supplement 11.  CKD stage III.  Baseline creatinine 1.2-1.35. at baseline enc fluid intake  12.  Hyperlipidemia.   Patient intolerant with pravastatin and other statins.  Follow-up outpatient with PCP    LOS: 7 days A FACE TO Jolly E Zuria Fosdick 03/16/2019, 9:50 AM

## 2019-03-16 NOTE — Progress Notes (Signed)
Physical Therapy Session Note  Patient Details  Name: Julia Banks MRN: [id number] Date of Birth: 1940-11-14  Today's Date: 03/16/2019 PT Individual Time: 1102-1200 PT Individual Time Calculation (min): 58 min   Short Term Goals: Week 1:  PT Short Term Goal 1 (Week 1): Pt will perform min assist bed mobility PT Short Term Goal 2 (Week 1): PT will perform wc<>bed with mod assist. PT Short Term Goal 3 (Week 1): Pt will propel WC 170f with supervision assist PT Short Term Goal 4 (Week 1): Pt will ambulate 378fwith max assist of 1 and LRAD  Skilled Therapeutic Interventions/Progress Updates:  Pt presented in w/c agreeable to therapy. Pt c/o general soreness but no specific pain and does not wish intervention. Pt transported to rehab gym for energy conservation. Performed squat pivot modA to mat. Participated in STS and single step with LLE to target for coordination and wt shifting. Pt noted to have improved L ankle stability with wt shifting this session. Performed STS with 2in step under RLE for increased recruitment of LLE x 5. Performed clothespin placement to basketball net for standing tolerance with verbal cues for increased glute/quad activation due to intermittent L knee buckling. Performed squat pivot transfer to w/c min/modA. Pt transported to day room and participated in Cybex Kinetron 50cm/sec x 3 min for BLE strengthening and reciprocal activity. Pt returned to room at end of session and left with call bell within reach and current needs met.      Therapy Documentation Precautions:  Precautions Precautions: Fall Precaution Comments: left hemi Restrictions Weight Bearing Restrictions: No General:   Vital Signs:  Pain:     Therapy/Group: Individual Therapy  Lacrystal Barbe  Keanen Dohse, PTA  03/16/2019, 12:20 PM

## 2019-03-16 NOTE — Patient Care Conference (Signed)
Inpatient RehabilitationTeam Conference and Plan of Care Update Date: 03/16/2019   Time: 10:45 AM    Patient Name: Julia Banks      Medical Record Number: [id number]  Date of Birth: [birth year] Sex: Female         Room/Bed: 4W02C/4W02C-01 Payor Info: Payor: MEDICARE / Plan: MEDICARE PART A AND B / Product Type: *No Product type* /    Admit Date/Time:  03/09/2019  4:05 PM  Primary Diagnosis:  <principal problem not specified>  Patient Active Problem List   Diagnosis Date Noted  . Cerebrovascular accident (CVA) of right basal ganglia ([place]) 03/09/2019  . Hemiparesis affecting left side as late effect of cerebrovascular accident (CVA) ([place]) 03/09/2019  . CVA (cerebral vascular accident) ([place]) 03/08/2019  . Ischemic stroke ([place]) 03/07/2019  . CKD (chronic kidney disease), stage III ([place]) 03/07/2019  . Advanced care planning/counseling discussion 01/27/2016  . Sensorineural hearing loss (SNHL) of both ears 01/27/2016  . Chest discomfort 01/27/2016  . Insomnia 02/13/2015  . Chronic cough 10/10/2014  . Osteopenia 07/31/2014  . S/P mastectomy 06/07/2014  . Genetic testing 06/02/2014  . Breast cancer of upper-outer quadrant of left female breast ([place]) 03/28/2014  . Grieving 10/25/2013  . Secondary hyperparathyroidism of renal origin ([place])   . Medicare annual wellness visit, subsequent 06/28/2013  . Diabetes mellitus with stage 3 chronic kidney disease ([place])   . Hypothyroidism 04/13/2013  . GERD (gastroesophageal reflux disease)   . Essential hypertension, benign 09/17/2012  . HLD (hyperlipidemia) 09/17/2012  . Type 2 diabetes, uncontrolled, with neuropathy ([place]) 02/15/2012  . OSA on CPAP 02/13/2012  . Coronary artery disease 02/13/2012    Expected Discharge Date: Expected Discharge Date: 04/01/19  Team Members Present: Physician leading conference: Dr. Alysia Penna Social Worker Present: Ovidio Kin, LCSW Nurse Present: Judee Clara, LPN PT Present: Phylliss Bob,  PTA;Barrie Folk, PT OT Present: Darleen Crocker, OT PPS Coordinator present : Gunnar Fusi, SLP     Current Status/Progress Goal Weekly Team Focus  Bowel/Bladder   Continent of bowel and bladder LBM-9/15  Maintain control of bowel and bladder  Continue to assist with toileting needs as needed   Swallow/Nutrition/ Hydration             ADL's   Min A bathing at shower level using leans, setup UB dressing, Mod A LB dressing, Mod A squat pivot bathroom transfers, Max A toileting. Lt knee buckles during standing activity. Hypotonic L UE with some proximal activation  Supervision overall  NMR, functional transfers, standing balance, ADL retraining, pt/family education   Mobility   minA bed mobility, modA squat pivot transfers, minA STS with RW, maxA gait with RW with L ankle instability noted  Supervision transfers. Supervision gait.  standing/dynamic balance, transfers, pre-gait/gait, d/c planning   Communication             Safety/Cognition/ Behavioral Observations            Pain   Reports generalized pain related to therapy-controlled with prn tylenol  Pain <2/10  Assess and address pain every shift and as needed   Skin   Ecchymosis noted to bilateral upper extremities  No further skin issues  Assess skin every shift and as needed      *See Care Plan and progress notes for long and short-term goals.     Barriers to Discharge  Current Status/Progress Possible Resolutions Date Resolved   Nursing  PT                    OT                  SLP                SW                Discharge Planning/Teaching Needs:  Family working on 24 hr care plan, aware pt will need someone with her at DC from CIR. Pt very motivated and making good progress in therapies      Team Discussion:  Goals overall supervision level. L-ankle and L-knee instability therapists working on this and may try brace. Very motivated and willing to push herself to make progress here. MD  adjusting DM and BP meds. Currently min-mod level.  Revisions to Treatment Plan:  Dc 10/2    Medical Summary Current Status: Diabetes uncontrolled, hypertension uncontrolled, bowel and bladder doing okay Weekly Focus/Goal: Reinitiate hypertension medications, adjust diabetic medications  Barriers to Discharge: Medical stability;Weight   Possible Resolutions to Barriers: Restart Cozaar, monitor renal status, trial glimepiride   Continued Need for Acute Rehabilitation Level of Care: The patient requires daily medical management by a physician with specialized training in physical medicine and rehabilitation for the following reasons: Direction of a multidisciplinary physical rehabilitation program to maximize functional independence : Yes Medical management of patient stability for increased activity during participation in an intensive rehabilitation regime.: Yes Analysis of laboratory values and/or radiology reports with any subsequent need for medication adjustment and/or medical intervention. : Yes   I attest that I was present, lead the team conference, and concur with the assessment and plan of the team. Teleconference held due to COVID19   Elease Hashimoto 03/16/2019, 2:48 PM

## 2019-03-16 NOTE — Progress Notes (Signed)
Occupational Therapy Session Note  Patient Details  Name: Julia Banks MRN: [id number] Date of Birth: [birth year]  Today's Date: 03/16/2019 OT Individual Time: [id number] OT Individual Time Calculation (min): 73 min    Short Term Goals: Week 1:  OT Short Term Goal 1 (Week 1): Pt will perform shower transfer with mod A overall. OT Short Term Goal 2 (Week 1): Pt will engage in toilet with mod A overall. OT Short Term Goal 3 (Week 1): Pt will demonstrate hemiplegic dressing technique with min A for UB.  Skilled Therapeutic Interventions/Progress Updates:    Upon entering the room, pt supine in bed awaiting OT arrival with no c/o pain. Pt performed supine >sit with min A to EOB. Pt assisted to bathroom for toileting with mod A squat pivot transfer onto elevated commode chair. Pt required assistance with hygiene and clothing management. Pt transferred onto TTB for bathing at shower level. Pt with 1 LOB to the L with mod A to correct. Pt needed hand held assistance to integrate L hand into functional washing tasks. Pt seated in wheelchair with focus on hemi plegic dressing technique and figure four position to increase independence with self care tasks. Pt propelled wheelchair with hemiplegic technique to day room. Pt engaged in L UE NMR with table slides in all planes of movement x 10 reps each with rest breaks as needed secondary to fatigue. Pt returning to room and remained in wheelchair with chair alarm donned and call bell within reach.   Therapy Documentation Precautions:  Precautions Precautions: Fall Precaution Comments: left hemi Restrictions Weight Bearing Restrictions: No General:   Vital Signs: Therapy Vitals Temp: 98.8 F (37.1 C) Temp Source: Oral Pulse Rate: 71 Resp: 18 BP: (!) 146/88 Patient Position (if appropriate): Sitting Oxygen Therapy SpO2: 98 % O2 Device: Nasal Cannula Pain: Pain Assessment Pain Scale: 0-10 Pain Score: 0-No pain   Therapy/Group:  Individual Therapy  Gypsy Decant 03/16/2019, 4:42 PM

## 2019-03-16 NOTE — Progress Notes (Signed)
Social Work Patient ID: Julia Banks, female   DOB: [birth year], 78 y.o.   MRN: [id number]  Met with pt and left a message for her niece to inform team conference goals supervision level and target discharge date 10/2. Pt is pleased with her progress and wants to get as high level as she can before discharge. Will await niece's call regarding questions regarding conference.

## 2019-03-17 ENCOUNTER — Inpatient Hospital Stay (HOSPITAL_COMMUNITY): Payer: Medicare Other | Admitting: Physical Therapy

## 2019-03-17 ENCOUNTER — Inpatient Hospital Stay (HOSPITAL_COMMUNITY): Payer: Medicare Other | Admitting: Occupational Therapy

## 2019-03-17 LAB — GLUCOSE, CAPILLARY
Glucose-Capillary: 118 mg/dL — ABNORMAL HIGH (ref 70–99)
Glucose-Capillary: 137 mg/dL — ABNORMAL HIGH (ref 70–99)
Glucose-Capillary: 145 mg/dL — ABNORMAL HIGH (ref 70–99)
Glucose-Capillary: 214 mg/dL — ABNORMAL HIGH (ref 70–99)

## 2019-03-17 NOTE — Progress Notes (Signed)
Occupational Therapy Weekly Progress Note  Patient Details  Name: Julia Banks MRN: [id number] Date of Birth: [birth year]  Beginning of progress report period: March 10, 2019 End of progress report period: March 19, 2019  Today's Date: 03/17/2019 OT Individual Time: 0704-0800 and 0900-1014 OT Individual Time Calculation (min): 56 min and 74 min   Patient has met 2 of 3 short term goals.  Pt making excellent progress towards occupational therapy goals. Pt transferring overall with mod A for standing/squat pivot transfers. Pt needing min - mod cuing for technique. Pt bathing at shower level with mod A overall and S - min guard for sitting balance. Pt's L UE moves much better in an gravity eliminated position. She has limited mobility againts gravity and is not functional at this time. Hand over hand utilized to intergrate into functional tasks.   Patient continues to demonstrate the following deficits: muscle weakness, decreased cardiorespiratoy endurance, decreased coordination and decreased motor planning and decreased sitting balance, decreased standing balance, decreased postural control, hemiplegia and decreased balance strategies and therefore will continue to benefit from skilled OT intervention to enhance overall performance with BADL and iADL.  Patient progressing toward long term goals..  Continue plan of care.  OT Short Term Goals Week 1:  OT Short Term Goal 1 (Week 1): Pt will perform shower transfer with mod A overall. OT Short Term Goal 1 - Progress (Week 1): Met OT Short Term Goal 2 (Week 1): Pt will engage in toilet with mod A overall. OT Short Term Goal 2 - Progress (Week 1): Not met OT Short Term Goal 3 (Week 1): Pt will demonstrate hemiplegic dressing technique with min A for UB. OT Short Term Goal 3 - Progress (Week 1): Met Week 2:  OT Short Term Goal 1 (Week 2): Pt will perform toileting with mod A overall. OT Short Term Goal 2 (Week 2): Pt will perform LB  dressing with mod A overall. OT Short Term Goal 3 (Week 2): Pt will perform bathing at shower level with min A overall.  Skilled Therapeutic Interventions/Progress Updates:    Session 1:  Upon entering the room, pt supine in bed awaiting OT arrival. Pt with no c/o pain. Supine >sit with min A to EOB. Pt transferred into wheelchair with mod A stand pivot transfer and requesting to use bathroom. Pt transferred onto commode chair in same manner and able to void. Pt standing with mod A standing balance to perform hygiene. Pt needing assistance for LB clothing management and returned to wheelchair. Pt declined bathing but performed grooming tasks at sink with increased time and supervision. Focus on hemiplegic dressing techniques with min A for UB self care and mod A for LB dressing. Pt utilized figure four position to increase success with task. OT placed elbow protector on L UE for protection. Pt remained in wheelchair at the end of the session with chair alarm donned and activated and call bell within reach.   Session 2: Upon entering the room, pt seated in wheelchair with no c/o pain. Niece present but remains in room as therapist and pt head to day room. Pt standing from wheelchair at high low table with mod A. Pt placed into weight bearing position while standing through L UE while engaged in table top task. OT blocking L knee secondary to instability and pt maintained balance with min A overall while reaching across midline. Pt standing for 12 minutes before taking seated rest break. Pt engaged in 6 sit <>stands with focus  on sequencing, technique, and standing balance. Pt standing each time for 1-2 minutes as well with min A for standing balance. OT placed in front of pt with L knee blocked. Pt standing and steadying self with R hand placed on OT's L shoulder. Mirror placed in front of pt for visual feedback and pt engaged L knee with cuing to do so. Pt needing decreasing cues with each stand and sitting  very slowly with controlled movement and min A. Pt returning to room at end of session and returning to bed secondary to fatigue. Pt needing mod A stand pivot transfer back to bed with niece remaining in room. Call bell within reach and bed alarm activated.  Therapy Documentation Precautions:  Precautions Precautions: Fall Precaution Comments: left hemi Restrictions Weight Bearing Restrictions: No   Pain: Pain Assessment Pain Scale: 0-10 Pain Score: 0-No pain   Therapy/Group: Individual Therapy  Gypsy Decant 03/17/2019, 1:09 PM

## 2019-03-17 NOTE — Progress Notes (Signed)
[place] PHYSICAL MEDICINE & REHABILITATION PROGRESS NOTE   Subjective/Complaints:  No issues overntie   ROS Neg CP, SOB, N/V/D  Objective:   No results found. No results for input(s): WBC, HGB, HCT, PLT in the last 72 hours. Recent Labs    03/16/19 0608  CREATININE 1.27*    Intake/Output Summary (Last 24 hours) at 03/17/2019 0832 Last data filed at 03/16/2019 1850 Gross per 24 hour  Intake 600 ml  Output -  Net 600 ml     Physical Exam: Vital Signs Blood pressure (!) 179/62, pulse 66, temperature 98 F (36.7 C), resp. rate 18, height 5\' 6"  (1.676 m), weight 86 kg, SpO2 97 %.   General: No acute distress Mood and affect are appropriate Heart: Regular rate and rhythm no rubs murmurs or extra sounds Lungs: Clear to auscultation, breathing unlabored, no rales or wheezes Abdomen: Positive bowel sounds, soft nontender to palpation, nondistended Extremities: No clubbing, cyanosis, or edema Skin: No evidence of breakdown, no evidence of rash Neurologic: Cranial nerves II through XII intact, motor strength is 3- in left deltoid, bicep, tricep, 2- grip, 2- finger extensors, 2- hip flexor, 2- knee extensors, 0 ankle dorsiflexor and plantar flexor, 5/5 on the right Sensory exam normal sensation to light touch and proprioception in bilateral upper and lower extremities Cerebellar exam normal finger to nose to finger as well as heel to shin in bilateral upper and lower extremities Musculoskeletal: Full range of motion in all 4 extremities. No joint swelling   Assessment/Plan: 1. Functional deficits secondary to Left hemiparesis after Right subcortical infarct which require 3+ hours per day of interdisciplinary therapy in a comprehensive inpatient rehab setting.  Physiatrist is providing close team supervision and 24 hour management of active medical problems listed below.  Physiatrist and rehab team continue to assess barriers to discharge/monitor patient progress toward  functional and medical goals  Care Tool:  Bathing    Body parts bathed by patient: Left arm, Chest, Abdomen, Front perineal area, Right upper leg, Left upper leg, Right lower leg, Face, Left lower leg, Buttocks   Body parts bathed by helper: Right arm     Bathing assist Assist Level: Minimal Assistance - Patient > 75%     Upper Body Dressing/Undressing Upper body dressing   What is the patient wearing?: Pull over shirt    Upper body assist Assist Level: Minimal Assistance - Patient > 75%    Lower Body Dressing/Undressing Lower body dressing      What is the patient wearing?: Underwear/pull up, Pants     Lower body assist Assist for lower body dressing: Moderate Assistance - Patient 50 - 74%     Toileting Toileting    Toileting assist Assist for toileting: Maximal Assistance - Patient 25 - 49%     Transfers Chair/bed transfer  Transfers assist     Chair/bed transfer assist level: Moderate Assistance - Patient 50 - 74%     Locomotion Ambulation   Ambulation assist      Assist level: 2 helpers(max A and +2 w/c follow) Assistive device: Other (comment)(hallway rail) Max distance: 68ft   Walk 10 feet activity   Assist     Assist level: 2 helpers(max A and +2 w/c follow) Assistive device: Other (comment)(hallway rail)   Walk 50 feet activity   Assist Walk 50 feet with 2 turns activity did not occur: Safety/medical concerns         Walk 150 feet activity   Assist Walk 150 feet activity did  not occur: Safety/medical concerns         Walk 10 feet on uneven surface  activity   Assist Walk 10 feet on uneven surfaces activity did not occur: Safety/medical concerns         Wheelchair     Assist   Type of Wheelchair: Manual    Wheelchair assist level: Supervision/Verbal cueing Max wheelchair distance: 150    Wheelchair 50 feet with 2 turns activity    Assist        Assist Level: Supervision/Verbal cueing    Wheelchair 150 feet activity     Assist  Wheelchair 150 feet activity did not occur: Safety/medical concerns   Assist Level: Supervision/Verbal cueing   Blood pressure (!) 179/62, pulse 66, temperature 98 F (36.7 C), resp. rate 18, height 5\' 6"  (1.676 m), weight 86 kg, SpO2 97 %.  Medical Problem List and Plan: 1.  Left-sided weakness secondary to acute perforator infarct at the right basal ganglia corona radiata onset 03/07/2019.  Recommendations for 30-day cardiac event monitor PT, OT CIR  2.  Antithrombotics: -DVT/anticoagulation: Subcutaneous Lovenox             -antiplatelet therapy: Aspirin 81 mg daily, Plavix 75 mg daily x3 weeks then aspirin alone 3. Pain Management: Tylenol as needed 4. Mood: Provide emotional support             -antipsychotic agents: N/A 5. Neuropsych: This patient is capable of making decisions on her own behalf. 6. Skin/Wound Care: Routine skin checks 7. Fluids/Electrolytes/Nutrition: Routine in and outs with follow-up chemistries 8.  Hypertension.    Will start to tighten control later this week Patient on Lasix 20 mg daily, Cozaar 50 mg daily, Toprol-XL 50 mg daily prior to admission.  Resume as needed Vitals:   03/16/19 1923 03/17/19 0604  BP: (!) 159/58 (!) 179/62  Pulse: 70 66  Resp: 18 18  Temp: 99.9 F (37.7 C) 98 F (36.7 C)  SpO2: 97% 97%  Resume Cozaar today  9.  Diabetes mellitus.  Hemoglobin A1c 7.2.  Lantus insulin 40 units daily.  Check blood sugars before meals and at bedtime CBG (last 3)  Recent Labs    03/16/19 1707 03/16/19 2103 03/17/19 0604  GLUCAP 179* 290* 118*  am CBG controlled with Lantus  47 units today, add glimepiride 1mg  for daytime and evening sugars , may need to adjust  10.  Hypothyroidism.  Continue supplement 11.  CKD stage III.  Baseline creatinine 1.2-1.35. at baseline enc fluid intake  12.  Hyperlipidemia.  Patient intolerant with pravastatin and other statins.  Follow-up outpatient with PCP     LOS: 8 days A FACE TO FACE EVALUATION WAS PERFORMED  Charlett Blake 03/17/2019, 8:32 AM

## 2019-03-17 NOTE — Progress Notes (Signed)
Physical Therapy Session Note  Patient Details  Name: SKYLEE HELMKE MRN: [id number] Date of Birth: October 22, 1940  Today's Date: 03/17/2019 PT Individual Time: 1530-1630 PT Individual Time Calculation (min): 60 min   Short Term Goals: Week 1:  PT Short Term Goal 1 (Week 1): Pt will perform min assist bed mobility PT Short Term Goal 2 (Week 1): PT will perform wc<>bed with mod assist. PT Short Term Goal 3 (Week 1): Pt will propel WC 160ft with supervision assist PT Short Term Goal 4 (Week 1): Pt will ambulate 54ft with max assist of 1 and LRAD     Skilled Therapeutic Interventions/Progress Updates:  Pt. Received upright in wheelchair and agreeable to PT treatment session. Pt. Transported to therapy gym. Pt. Ambulated 30 feet with RW and mod-maxA requiring verbal cueing for L foot placement and tactile cueing at the R hip and for stabilization and midline of L ribs/trunk. Then, pt. Ambulated 70 feet with L foot acewrapped into DF and 2# ankle weight to help with swing through and toe off during gait and provide proprioceptive feedback. Pt. took a rest break d/t fatigue and then ambulated 30 feet with RW and min-modA. Pt. Did better with short bouts of gait once a mirror was placed in front of her to provide a visual for midline and not learning to the L.   [email or web address] sit to stands from mat table requiring minA and mirror in front for visual cueing to stay centered and midline. Pt. Completed taps to 2 inch box. 2x8 bilaterally with min-modA for safety and improved weightshifting. Pt. Completed reaching exercise, weightshfiting to the R and reaching with her R UE to grasp objects. Pt. Completed reaching exercise weighshfiting to the L and tapping cone middair with L UE.    Therapy Documentation Precautions:  Precautions Precautions: Fall Precaution Comments: left hemi Restrictions Weight Bearing Restrictions: No    Pain: denies pain   Therapy/Group: Individual Therapy  Olena Leatherwood 03/17/2019, 5:45 PM

## 2019-03-18 ENCOUNTER — Inpatient Hospital Stay (HOSPITAL_COMMUNITY): Payer: Medicare Other | Admitting: Occupational Therapy

## 2019-03-18 ENCOUNTER — Inpatient Hospital Stay (HOSPITAL_COMMUNITY): Payer: Medicare Other | Admitting: Physical Therapy

## 2019-03-18 LAB — GLUCOSE, CAPILLARY
Glucose-Capillary: 120 mg/dL — ABNORMAL HIGH (ref 70–99)
Glucose-Capillary: 127 mg/dL — ABNORMAL HIGH (ref 70–99)
Glucose-Capillary: 87 mg/dL (ref 70–99)
Glucose-Capillary: 226 mg/dL — ABNORMAL HIGH (ref 70–99)
Glucose-Capillary: 186 mg/dL — ABNORMAL HIGH (ref 70–99)

## 2019-03-18 NOTE — Progress Notes (Signed)
[place] PHYSICAL MEDICINE & REHABILITATION PROGRESS NOTE   Subjective/Complaints:  No issues overnite, good BM yesterday   ROS Neg CP, SOB, N/V/D  Objective:   No results found. No results for input(s): WBC, HGB, HCT, PLT in the last 72 hours. Recent Labs    03/16/19 0608  CREATININE 1.27*    Intake/Output Summary (Last 24 hours) at 03/18/2019 0824 Last data filed at 03/17/2019 1900 Gross per 24 hour  Intake 720 ml  Output -  Net 720 ml     Physical Exam: Vital Signs Blood pressure (!) 154/54, pulse 65, temperature 98 F (36.7 C), resp. rate 15, height 5\' 6"  (1.676 m), weight 86 kg, SpO2 99 %.   General: No acute distress Mood and affect are appropriate Heart: Regular rate and rhythm no rubs murmurs or extra sounds Lungs: Clear to auscultation, breathing unlabored, no rales or wheezes Abdomen: Positive bowel sounds, soft nontender to palpation, nondistended Extremities: No clubbing, cyanosis, or edema Skin: No evidence of breakdown, no evidence of rash Neurologic: Cranial nerves II through XII intact, motor strength is 3- in left deltoid, bicep, tricep, 2- grip, 2- finger extensors, 2- hip flexor, 2- knee extensors, 0 ankle dorsiflexor and plantar flexor, 5/5 on the right Sensory exam normal sensation to light touch and proprioception in bilateral upper and lower extremities Cerebellar exam normal finger to nose to finger as well as heel to shin in bilateral upper and lower extremities Musculoskeletal: Full range of motion in all 4 extremities. No joint swelling   Assessment/Plan: 1. Functional deficits secondary to Left hemiparesis after Right subcortical infarct which require 3+ hours per day of interdisciplinary therapy in a comprehensive inpatient rehab setting.  Physiatrist is providing close team supervision and 24 hour management of active medical problems listed below.  Physiatrist and rehab team continue to assess barriers to discharge/monitor patient  progress toward functional and medical goals  Care Tool:  Bathing    Body parts bathed by patient: Left arm, Chest, Abdomen, Front perineal area, Right upper leg, Left upper leg, Right lower leg, Face, Left lower leg, Buttocks   Body parts bathed by helper: Right arm     Bathing assist Assist Level: Minimal Assistance - Patient > 75%     Upper Body Dressing/Undressing Upper body dressing   What is the patient wearing?: Pull over shirt    Upper body assist Assist Level: Minimal Assistance - Patient > 75%    Lower Body Dressing/Undressing Lower body dressing      What is the patient wearing?: Underwear/pull up, Pants     Lower body assist Assist for lower body dressing: Moderate Assistance - Patient 50 - 74%     Toileting Toileting    Toileting assist Assist for toileting: Maximal Assistance - Patient 25 - 49%     Transfers Chair/bed transfer  Transfers assist     Chair/bed transfer assist level: Moderate Assistance - Patient 50 - 74%     Locomotion Ambulation   Ambulation assist      Assist level: 2 helpers(max A and +2 w/c follow) Assistive device: Other (comment)(hallway rail) Max distance: 38ft   Walk 10 feet activity   Assist     Assist level: 2 helpers(max A and +2 w/c follow) Assistive device: Other (comment)(hallway rail)   Walk 50 feet activity   Assist Walk 50 feet with 2 turns activity did not occur: Safety/medical concerns         Walk 150 feet activity   Assist Walk 150  feet activity did not occur: Safety/medical concerns         Walk 10 feet on uneven surface  activity   Assist Walk 10 feet on uneven surfaces activity did not occur: Safety/medical concerns         Wheelchair     Assist   Type of Wheelchair: Manual    Wheelchair assist level: Supervision/Verbal cueing Max wheelchair distance: 150    Wheelchair 50 feet with 2 turns activity    Assist        Assist Level: Supervision/Verbal  cueing   Wheelchair 150 feet activity     Assist  Wheelchair 150 feet activity did not occur: Safety/medical concerns   Assist Level: Supervision/Verbal cueing   Blood pressure (!) 154/54, pulse 65, temperature 98 F (36.7 C), resp. rate 15, height 5\' 6"  (1.676 m), weight 86 kg, SpO2 99 %.  Medical Problem List and Plan: 1.  Left-sided weakness secondary to acute perforator infarct at the right basal ganglia corona radiata onset 03/07/2019.  Recommendations for 30-day cardiac event monitor PT, OT CIR  2.  Antithrombotics: -DVT/anticoagulation: Subcutaneous Lovenox             -antiplatelet therapy: Aspirin 81 mg daily, Plavix 75 mg daily x3 weeks then aspirin alone 3. Pain Management: Tylenol as needed 4. Mood: Provide emotional support             -antipsychotic agents: N/A 5. Neuropsych: This patient is capable of making decisions on her own behalf. 6. Skin/Wound Care: Routine skin checks 7. Fluids/Electrolytes/Nutrition: Routine in and outs with follow-up chemistries 8.  Hypertension.    Will start to tighten control later this week Patient on Lasix 20 mg daily, Cozaar 50 mg daily, Toprol-XL 50 mg daily prior to admission.  Resume as needed Vitals:   03/17/19 2106 03/18/19 0458  BP:  (!) 154/54  Pulse:  65  Resp:  15  Temp:  98 F (36.7 C)  SpO2: 97% 99%  Resume Cozaar today  9.  Diabetes mellitus.  Hemoglobin A1c 7.2.  Lantus insulin 40 units daily.  Check blood sugars before meals and at bedtime CBG (last 3)  Recent Labs    03/17/19 2107 03/18/19 0558 03/18/19 0630  GLUCAP 214* 120* 127*  am CBG controlled with Lantus  47 units today, add glimepiride 1mg  for daytime and evening sugars , may need to adjust  10.  Hypothyroidism.  Continue supplement 11.  CKD stage III.  Baseline creatinine 1.2-1.35. at baseline enc fluid intake  12.  Hyperlipidemia.  Patient intolerant with pravastatin and other statins.  Follow-up outpatient with PCP    LOS: 9 days A FACE TO  FACE EVALUATION WAS PERFORMED  Charlett Blake 03/18/2019, 8:24 AM

## 2019-03-18 NOTE — Progress Notes (Signed)
Occupational Therapy Session Note  Patient Details  Name: Julia Banks MRN: [id number] Date of Birth: Dec 28, 1940  Today's Date: 03/18/2019 OT Individual Time: 1400-1500 OT Individual Time Calculation (min): 60 min    Short Term Goals: Week 1:  OT Short Term Goal 1 (Week 1): Pt will perform shower transfer with mod A overall. OT Short Term Goal 1 - Progress (Week 1): Met OT Short Term Goal 2 (Week 1): Pt will engage in toilet with mod A overall. OT Short Term Goal 2 - Progress (Week 1): Not met OT Short Term Goal 3 (Week 1): Pt will demonstrate hemiplegic dressing technique with min A for UB. OT Short Term Goal 3 - Progress (Week 1): Met Week 2:  OT Short Term Goal 1 (Week 2): Pt will perform toileting with mod A overall. OT Short Term Goal 2 (Week 2): Pt will perform LB dressing with mod A overall. OT Short Term Goal 3 (Week 2): Pt will perform bathing at shower level with min A overall. Week 3:     Skilled Therapeutic Interventions/Progress Updates:    1:1 NMR with focus on normal patterns of movement and initation of movement in left UE both in unsupported sitting and in supine. Pt with abnormal shoulder compensations occurring with initation of movement in UE (even when attempting more distal movements). Addressed avoiding hiking shoulder with functional reaching tasks. Pt with improved grasp and slight index finger extension but difficulty with sustaining grasp with Ue movement. Pt with difficulty with control of movement addressed with shoulder stabilization activities in supine. Also used mirror for visual feedback with functional reaching and movement of UE to self correct abnormal movements at the shoulder.   Therapy Documentation Precautions:  Precautions Precautions: Fall Precaution Comments: left hemi Restrictions Weight Bearing Restrictions: No Pain: No c/o pain in session   Therapy/Group: Individual Therapy  Willeen Cass [facility] 03/18/2019, 3:24 PM

## 2019-03-18 NOTE — Progress Notes (Signed)
Occupational Therapy Session Note  Patient Details  Name: Julia Banks MRN: [id number] Date of Birth: 07/09/1940  Today's Date: 03/18/2019 OT Individual Time: 0902-1000 OT Individual Time Calculation (min): 58 min   Skilled Therapeutic Interventions/Progress Updates:    Pt greeted in bed, asleep, easily woken and requesting to shower. Steady assist for transition EOB with HOB elevated. Mod A for stand pivot<w/c with improved control in Lt knee compared to session last weekend with this OT. Mod A for stand pivot<TTB afterwards. She utilized lateral leans to doff LB clothing and then proceeded with bathing. Wash mitt provided to integrate affected UE when washing lower legs using figure 4 position. HOH for using L UE to wash Rt side but also demonstrated to pt one handed strategies to do this. Steady assist for dynamic balance while she assisted with perihygiene in the back. OT assisted with thoroughness due to BM residue. Dressing was then completed sit<stand at the sink. Pt once again utilizing figure 4 position functionally with supervision-steady assist for sitting balance. Mod A for sit<stand and for dynamic standing balance while she elevated pants on Rt side. Assist required on the Lt side. Assist also required for donning Ted hose. Pt donned her overhead shirt with setup assist using hemi strategies. Min A for integrating Lt hand during oral care w/c level. Pt remained in w/c with arm trough, safety belt, and all needs. Tx focus placed on Lt NMR, Lt knee control in standing, adaptive self care skills, and dynamic balance.    Therapy Documentation Precautions:  Precautions Precautions: Fall Precaution Comments: left hemi Restrictions Weight Bearing Restrictions: No Pain: Pt reported taking a tylenol before OT due to Lt sided/Lt hip soreness Pain Assessment Pain Scale: 0-10 Pain Score: 4  ADL:       Therapy/Group: Individual Therapy  Ardit Danh A Ranell Skibinski 03/18/2019, 12:37 PM

## 2019-03-18 NOTE — Progress Notes (Signed)
Physical Therapy Session Note  Patient Details  Name: Julia Banks MRN: [id number] Date of Birth: Dec 20, 1940  Today's Date: 03/18/2019 PT Individual Time: 1100-1200 PT Individual Time Calculation (min): 60 min   Short Term Goals: Week 1:  PT Short Term Goal 1 (Week 1): Pt will perform min assist bed mobility PT Short Term Goal 2 (Week 1): PT will perform wc<>bed with mod assist. PT Short Term Goal 3 (Week 1): Pt will propel WC 159f with supervision assist PT Short Term Goal 4 (Week 1): Pt will ambulate 336fwith max assist of 1 and LRAD    Skilled Therapeutic Interventions/Progress Updates:  Pt. Sitting upright in wheelchair and agreeable to PT treatment session. Pt. Reports fatigue s/p yesterday's session. Pt. Transported in wheelchair to the day room to set up on the LiFerrisPt. Tolerated standing for about 3 minutes with RW and CGA while donning harness. Pt. Completed the folllowing:    LiteGait over the treadmill:   Trial 1: 5 minutes at 0.48m21m 0.03 miles   Trial 2: 5 minutes at 0.6mp53m0.08 miles   Total: 10 minutes, 0.11 miles, 460 feet  LiteGait overground: 250 feet   Both with the LiteGait over the treadmill and overground, pt. Had acewrap for L UE placement and acewrap to promote L ankle DF. Pt. Required min-mod assistance with L LE to increase step length and for foot placement while on the treadmill. Pt. Was able to advance the L LE overground with intermittent assistance for weightshifting allow increased step height on the L. Pt. Required occassional cueing for step width and upright posture/trunk control. Pt. Transported in wheelchair back to room. Pt. Left sitting upright in wheelchair with tray table and call bell in front and all needs met at this time.    Therapy Documentation Precautions:  Precautions Precautions: Fall Precaution Comments: left hemi Restrictions Weight Bearing Restrictions: No  Pain: Pt. Denies pain.   Therapy/Group: Individual  Therapy  KrisOlena LeatherwoodT  03/18/2019, 1:36 PM

## 2019-03-19 ENCOUNTER — Inpatient Hospital Stay (HOSPITAL_COMMUNITY): Payer: Medicare Other | Admitting: Occupational Therapy

## 2019-03-19 DIAGNOSIS — R7309 Other abnormal glucose: Secondary | ICD-10-CM

## 2019-03-19 DIAGNOSIS — I10 Essential (primary) hypertension: Secondary | ICD-10-CM

## 2019-03-19 DIAGNOSIS — D62 Acute posthemorrhagic anemia: Secondary | ICD-10-CM

## 2019-03-19 DIAGNOSIS — E1165 Type 2 diabetes mellitus with hyperglycemia: Secondary | ICD-10-CM

## 2019-03-19 DIAGNOSIS — Z794 Long term (current) use of insulin: Secondary | ICD-10-CM

## 2019-03-19 DIAGNOSIS — N183 Chronic kidney disease, stage 3 (moderate): Secondary | ICD-10-CM

## 2019-03-19 LAB — GLUCOSE, CAPILLARY
Glucose-Capillary: 130 mg/dL — ABNORMAL HIGH (ref 70–99)
Glucose-Capillary: 88 mg/dL (ref 70–99)
Glucose-Capillary: 238 mg/dL — ABNORMAL HIGH (ref 70–99)
Glucose-Capillary: 148 mg/dL — ABNORMAL HIGH (ref 70–99)

## 2019-03-19 MED ORDER — LOSARTAN POTASSIUM 50 MG PO TABS
25.0000 mg | ORAL_TABLET | Freq: Once | ORAL | Status: AC
  Administered 2019-03-19: 25 mg via ORAL
  Filled 2019-03-19: qty 1

## 2019-03-19 MED ORDER — DOCUSATE SODIUM 100 MG PO CAPS
100.0000 mg | ORAL_CAPSULE | Freq: Two times a day (BID) | ORAL | Status: DC
  Administered 2019-03-19 – 2019-03-27 (×17): 100 mg via ORAL
  Filled 2019-03-19 (×18): qty 1

## 2019-03-19 MED ORDER — LOSARTAN POTASSIUM 50 MG PO TABS
50.0000 mg | ORAL_TABLET | Freq: Every day | ORAL | Status: DC
  Administered 2019-03-20 – 2019-03-21 (×2): 50 mg via ORAL
  Filled 2019-03-19 (×2): qty 1

## 2019-03-19 NOTE — Progress Notes (Signed)
Occupational Therapy Session Note  Patient Details  Name: Julia Banks MRN: [id number] Date of Birth: 1940/08/05  Today's Date: 03/19/2019 OT Individual Time: 0900-1000 OT Individual Time Calculation (min): 60 min    Short Term Goals: Week 2:  OT Short Term Goal 1 (Week 2): Pt will perform toileting with mod A overall. OT Short Term Goal 2 (Week 2): Pt will perform LB dressing with mod A overall. OT Short Term Goal 3 (Week 2): Pt will perform bathing at shower level with min A overall.  Skilled Therapeutic Interventions/Progress Updates:    Upon entering the room, pt supine in bed with no c/o pain this session. Pt agreeable and motivated for OT intervention. Supine >sit with supervision and use of bed rail to EOB. Pt transferring to wheelchair with mod A stand pivot. Pt declined bathing but performed dressing with hemiplegic technique at sink. Set up A with UB self care and mod A LB self care. Min A for standing balance with LB clothing management. Pt able to pull pants over B hips this session! Grooming tasks performed with supervision and increased time at sink as well. Pt demonstrating self ROM exercises with min cuing for technique. Pt returning to bed for comfort. Call bell and all needed items within reach upon exiting the room.    Therapy Documentation Precautions:  Precautions Precautions: Fall Precaution Comments: left hemi Restrictions Weight Bearing Restrictions: No Vital Signs: Oxygen Therapy SpO2: 98 % O2 Device: Room Air Pain: Pain Assessment Pain Scale: 0-10 Pain Score: 0-No pain     Therapy/Group: Individual Therapy  Gypsy Decant 03/19/2019, 10:38 AM

## 2019-03-19 NOTE — Progress Notes (Signed)
Orthopedic Tech Progress Note Patient Details:  Julia Banks 1940/11/27 [id number]  Patient ID: [name], female   DOB: 1941-04-14, 78 y.o.   MRN: [id number]   [place] Pink 03/19/2019, 12:43 PM Called [facility] for left resting hand splint.

## 2019-03-19 NOTE — Progress Notes (Signed)
[facility] PHYSICAL MEDICINE & REHABILITATION PROGRESS NOTE   Subjective/Complaints: Patient seen sitting up in bed this morning.  She states she slept well overnight.  She states that she took a stool softener prior to admission and requests one now.  ROS: Denies CP, SOB, N/V/D  Objective:   No results found. No results for input(s): WBC, HGB, HCT, PLT in the last 72 hours. No results for input(s): NA, K, CL, CO2, GLUCOSE, BUN, CREATININE, CALCIUM in the last 72 hours.  Intake/Output Summary (Last 24 hours) at 03/19/2019 1117 Last data filed at 03/19/2019 0900 Gross per 24 hour  Intake 720 ml  Output -  Net 720 ml     Physical Exam: Vital Signs Blood pressure (!) 180/62, pulse 63, temperature 98.2 F (36.8 C), temperature source Oral, resp. rate 18, height 5\' 6"  (1.676 m), weight 86 kg, SpO2 98 %. Constitutional: No distress . Vital signs reviewed. HENT: Normocephalic.  Atraumatic. Eyes: EOMI. No discharge. Cardiovascular: No JVD. Respiratory: Normal effort.  No stridor. GI: Non-distended. Skin: Warm and dry.  Intact. Psych: Normal mood.  Normal behavior. Musc: No edema in extremities.  No tenderness in extremities. Neurologic: Alert Dysarthria Motor: Left upper extremity: Shoulder abduction, elbow flexion/extension 2/5, handgrip 3/5 with apraxia Left lower extremity: 3+-4-/5 proximal to distal  Assessment/Plan: 1. Functional deficits secondary to Left hemiparesis after Right subcortical infarct which require 3+ hours per day of interdisciplinary therapy in a comprehensive inpatient rehab setting.  Physiatrist is providing close team supervision and 24 hour management of active medical problems listed below.  Physiatrist and rehab team continue to assess barriers to discharge/monitor patient progress toward functional and medical goals  Care Tool:  Bathing    Body parts bathed by patient: Left arm, Chest, Abdomen, Front perineal area, Right upper leg, Left upper  leg, Right lower leg, Face, Left lower leg   Body parts bathed by helper: Buttocks, Right arm     Bathing assist Assist Level: Minimal Assistance - Patient > 75%     Upper Body Dressing/Undressing Upper body dressing   What is the patient wearing?: Pull over shirt    Upper body assist Assist Level: Set up assist    Lower Body Dressing/Undressing Lower body dressing      What is the patient wearing?: Underwear/pull up, Pants     Lower body assist Assist for lower body dressing: Moderate Assistance - Patient 50 - 74%     Toileting Toileting    Toileting assist Assist for toileting: Maximal Assistance - Patient 25 - 49%     Transfers Chair/bed transfer  Transfers assist     Chair/bed transfer assist level: Moderate Assistance - Patient 50 - 74%     Locomotion Ambulation   Ambulation assist      Assist level: Dependent - Patient 0% Assistive device: Lite Gait Max distance: 250   Walk 10 feet activity   Assist     Assist level: Dependent - Patient 0% Assistive device: Lite Gait   Walk 50 feet activity   Assist Walk 50 feet with 2 turns activity did not occur: Safety/medical concerns  Assist level: Dependent - Patient 0% Assistive device: Lite Gait    Walk 150 feet activity   Assist Walk 150 feet activity did not occur: Safety/medical concerns  Assist level: Dependent - Patient 0% Assistive device: Lite Gait    Walk 10 feet on uneven surface  activity   Assist Walk 10 feet on uneven surfaces activity did not occur: Safety/medical concerns  Wheelchair     Assist   Type of Wheelchair: Agricultural engineer assist level: Supervision/Verbal cueing Max wheelchair distance: 150    Wheelchair 50 feet with 2 turns activity    Assist        Assist Level: Supervision/Verbal cueing   Wheelchair 150 feet activity     Assist  Wheelchair 150 feet activity did not occur: Safety/medical concerns   Assist Level:  Supervision/Verbal cueing   Blood pressure (!) 180/62, pulse 63, temperature 98.2 F (36.8 C), temperature source Oral, resp. rate 18, height 5\' 6"  (1.676 m), weight 86 kg, SpO2 98 %.  Medical Problem List and Plan: 1.  Left-sided weakness secondary to acute perforator infarct at the right basal ganglia corona radiata onset 03/07/2019.  Recommendations for 30-day cardiac event monitor  Continue CIR  WHO ordered 2.  Antithrombotics: -DVT/anticoagulation: Subcutaneous Lovenox             -antiplatelet therapy: Aspirin 81 mg daily, Plavix 75 mg daily x3 weeks then aspirin alone 3. Pain Management: Tylenol as needed 4. Mood: Provide emotional support             -antipsychotic agents: N/A 5. Neuropsych: This patient is capable of making decisions on her own behalf. 6. Skin/Wound Care: Routine skin checks 7. Fluids/Electrolytes/Nutrition: Routine in and outs 8.  Hypertension.    Patient on Lasix 20 mg daily, Cozaar 50 mg daily, Toprol-XL 50 mg daily prior to admission.   Vitals:   03/19/19 0626 03/19/19 0818  BP: (!) 180/62   Pulse: 63   Resp: 18   Temp: 98.2 F (36.8 C)   SpO2: 99% 98%   Cozaar 25 started on 9/17, increased to 50 on 9/19  Extreme elevated on 9/19 9.  Diabetes mellitus with hyperglycemia.  Hemoglobin A1c 7.2. Check blood sugars before meals and at bedtime CBG (last 3)  Recent Labs    03/18/19 1651 03/18/19 2130 03/19/19 0631  GLUCAP 226* 186* 130*   AM CBG controlled with Lantus  47 units   Added glimepiride 1mg  on 9/17  Labile on 9/19 10.  Hypothyroidism.  Continue supplement 11.  CKD stage III.  Baseline creatinine 1.2-1.35.   Creatinine 1.27 on 9/16  Continue to monitor 12.  Hyperlipidemia.  Patient intolerant with pravastatin and other statins.  Follow-up outpatient with PCP 13.  Constipation:   Stool softener started on 9/19 14.  Acute blood loss anemia  Hemoglobin 11.7 on 9/10  Continue to monitor    LOS: 10 days A FACE TO FACE EVALUATION WAS  PERFORMED  Tenzin Edelman Lorie Phenix 03/19/2019, 11:17 AM

## 2019-03-19 NOTE — Progress Notes (Signed)
Occupational Therapy Session Note  Patient Details  Name: Julia Banks MRN: [id number] Date of Birth: 1941/01/11  Today's Date: 03/19/2019 OT Individual Time: 0175-1025 OT Individual Time Calculation (min): 42 min   Short Term Goals: Week 2:  OT Short Term Goal 1 (Week 2): Pt will perform toileting with mod A overall. OT Short Term Goal 2 (Week 2): Pt will perform LB dressing with mod A overall. OT Short Term Goal 3 (Week 2): Pt will perform bathing at shower level with min A overall.  Skilled Therapeutic Interventions/Progress Updates:    Pt greeted bed with niece present. ADL needs met and she was motivated to work on her Lt side. Supervision for supine<sit with HOB elevated and Mod A for stand pivot<w/c. Guided pt through mini squats while standing at the sink. Manual facilitation for increasing Lt weight shift to increase weightbearing. Had pt actively assist L UE with maintaining weightbearing position on the sink. Strength building in midranges when pt held squatted positions for 2 second holds. Noted improvement in knee control during 4th set. While sitting, guided pt through self ROM techniques, emphasis placed on the shoulder. Niece [name] had hands on practice with providing assist for shoulder external rotation stretches. Pt is now able to grasp a little with her Lt hand, though unable to release. Increased activation noted proximally as well, with pt able to push and pull OT's hand. Vcs and min facilitation for decreasing compensatory strategies of trunk at this time. Mod A for stand pivot<bed and supervision for transition to supine. Pt was left in bed with niece at end of session.   Therapy Documentation Precautions:  Precautions Precautions: Fall Precaution Comments: left hemi Restrictions Weight Bearing Restrictions: No Vital Signs: Therapy Vitals Temp: 98.3 F (36.8 C) Temp Source: Oral Pulse Rate: 66 Resp: 18 BP: (!) 176/57 Patient Position (if appropriate):  Lying Oxygen Therapy SpO2: 98 % O2 Device: Room Air Pain: She reports a little pain in Lt side but that it is manageable during tx    ADL:       Therapy/Group: Individual Therapy  Toba Claudio A Stryker Veasey 03/19/2019, 4:28 PM

## 2019-03-19 NOTE — Plan of Care (Signed)
  Problem: Consults Goal: RH STROKE PATIENT EDUCATION Description: See Patient Education module for education specifics  Outcome: Progressing Goal: Nutrition Consult-if indicated Outcome: Progressing Goal: Diabetes Guidelines if Diabetic/Glucose > 140 Description: If diabetic or lab glucose is > 140 mg/dl - Initiate Diabetes/Hyperglycemia Guidelines & Document Interventions  Outcome: Progressing   Problem: RH BOWEL ELIMINATION Goal: RH STG MANAGE BOWEL WITH ASSISTANCE Description: STG Manage Bowel with min Assistance. Outcome: Progressing Goal: RH STG MANAGE BOWEL W/MEDICATION W/ASSISTANCE Description: STG Manage Bowel with Medication with mod I Assistance. Outcome: Progressing   Problem: RH BLADDER ELIMINATION Goal: RH STG MANAGE BLADDER WITH ASSISTANCE Description: STG Manage Bladder With  min Assistance Outcome: Progressing   Problem: RH SKIN INTEGRITY Goal: RH STG SKIN FREE OF INFECTION/BREAKDOWN Description: With min assist Outcome: Progressing Goal: RH STG MAINTAIN SKIN INTEGRITY WITH ASSISTANCE Description: STG Maintain Skin Integrity With min Assistance. Outcome: Progressing   Problem: RH SAFETY Goal: RH STG ADHERE TO SAFETY PRECAUTIONS W/ASSISTANCE/DEVICE Description: STG Adhere to Safety Precautions With cues/reminders Assistance/Device. Outcome: Progressing Goal: RH STG DECREASED RISK OF FALL WITH ASSISTANCE Description: STG Decreased Risk of Fall With supervision Assistance. Outcome: Progressing   Problem: RH PAIN MANAGEMENT Goal: RH STG PAIN MANAGED AT OR BELOW PT'S PAIN GOAL Description: At or below level 5 Outcome: Progressing   Problem: RH KNOWLEDGE DEFICIT Goal: RH STG INCREASE KNOWLEDGE OF DIABETES Description: Pt will be able to manage DM using handouts and educational information with cues/reminders from family Outcome: Progressing Goal: RH STG INCREASE KNOWLEDGE OF HYPERTENSION Description: Pt will be able to manage HTN using handouts and  educational information with cues/reminders from family Outcome: Progressing Goal: RH STG INCREASE KNOWLEGDE OF HYPERLIPIDEMIA Description: Pt will be able to manage HLD using handouts and educational information with cues/reminders from family Outcome: Progressing Goal: RH STG INCREASE KNOWLEDGE OF STROKE PROPHYLAXIS Description: Pt will be able to manage secondary stroke prevention using handouts and educational information with cues/reminders from family Outcome: Progressing

## 2019-03-20 LAB — GLUCOSE, CAPILLARY
Glucose-Capillary: 118 mg/dL — ABNORMAL HIGH (ref 70–99)
Glucose-Capillary: 108 mg/dL — ABNORMAL HIGH (ref 70–99)
Glucose-Capillary: 210 mg/dL — ABNORMAL HIGH (ref 70–99)
Glucose-Capillary: 226 mg/dL — ABNORMAL HIGH (ref 70–99)

## 2019-03-20 NOTE — Plan of Care (Signed)
  Problem: Consults Goal: RH STROKE PATIENT EDUCATION Description: See Patient Education module for education specifics  Outcome: Progressing Goal: Nutrition Consult-if indicated Outcome: Progressing Goal: Diabetes Guidelines if Diabetic/Glucose > 140 Description: If diabetic or lab glucose is > 140 mg/dl - Initiate Diabetes/Hyperglycemia Guidelines & Document Interventions  Outcome: Progressing   Problem: RH BOWEL ELIMINATION Goal: RH STG MANAGE BOWEL WITH ASSISTANCE Description: STG Manage Bowel with min Assistance. Outcome: Progressing Goal: RH STG MANAGE BOWEL W/MEDICATION W/ASSISTANCE Description: STG Manage Bowel with Medication with mod I Assistance. Outcome: Progressing   Problem: RH BLADDER ELIMINATION Goal: RH STG MANAGE BLADDER WITH ASSISTANCE Description: STG Manage Bladder With  min Assistance Outcome: Progressing   Problem: RH SKIN INTEGRITY Goal: RH STG SKIN FREE OF INFECTION/BREAKDOWN Description: With min assist Outcome: Progressing Goal: RH STG MAINTAIN SKIN INTEGRITY WITH ASSISTANCE Description: STG Maintain Skin Integrity With min Assistance. Outcome: Progressing   Problem: RH SAFETY Goal: RH STG ADHERE TO SAFETY PRECAUTIONS W/ASSISTANCE/DEVICE Description: STG Adhere to Safety Precautions With cues/reminders Assistance/Device. Outcome: Progressing Goal: RH STG DECREASED RISK OF FALL WITH ASSISTANCE Description: STG Decreased Risk of Fall With supervision Assistance. Outcome: Progressing   Problem: RH PAIN MANAGEMENT Goal: RH STG PAIN MANAGED AT OR BELOW PT'S PAIN GOAL Description: At or below level 5 Outcome: Progressing   Problem: RH KNOWLEDGE DEFICIT Goal: RH STG INCREASE KNOWLEDGE OF DIABETES Description: Pt will be able to manage DM using handouts and educational information with cues/reminders from family Outcome: Progressing Goal: RH STG INCREASE KNOWLEDGE OF HYPERTENSION Description: Pt will be able to manage HTN using handouts and  educational information with cues/reminders from family Outcome: Progressing Goal: RH STG INCREASE KNOWLEGDE OF HYPERLIPIDEMIA Description: Pt will be able to manage HLD using handouts and educational information with cues/reminders from family Outcome: Progressing Goal: RH STG INCREASE KNOWLEDGE OF STROKE PROPHYLAXIS Description: Pt will be able to manage secondary stroke prevention using handouts and educational information with cues/reminders from family Outcome: Progressing   Problem: RH Vision Goal: RH LTG Vision (Specify) Outcome: Progressing   Problem: RH Pre-functional/Other (Specify) Goal: RH LTG Pre-functional (Specify) Outcome: Progressing Goal: RH LTG Interdisciplinary (Specify) 1 Description: RH LTG Interdisciplinary (Specify)1 Outcome: Progressing Goal: RH LTG Interdisciplinary (Specify) 2 Description: RH LTG Interdisciplinary (Specify) 2  Outcome: Progressing

## 2019-03-20 NOTE — Progress Notes (Signed)
PHYSICAL MEDICINE & REHABILITATION PROGRESS NOTE   Subjective/Complaints: Patient seen sitting up in bed this morning.  She states she slept fairly overnight.  She notes her throat was scratchy this morning, but it is improving with lozenges.  ROS: Denies CP, SOB, N/V/D  Objective:   No results found. No results for input(s): WBC, HGB, HCT, PLT in the last 72 hours. No results for input(s): NA, K, CL, CO2, GLUCOSE, BUN, CREATININE, CALCIUM in the last 72 hours.  Intake/Output Summary (Last 24 hours) at 03/20/2019 1049 Last data filed at 03/20/2019 0830 Gross per 24 hour  Intake 358 ml  Output -  Net 358 ml     Physical Exam: Vital Signs Blood pressure (!) 167/60, pulse (!) 59, temperature 98.6 F (37 C), temperature source Oral, resp. rate 16, height 5\' 6"  (1.676 m), weight 86 kg, SpO2 97 %. Constitutional: No distress . Vital signs reviewed. HENT: Normocephalic.  Atraumatic. Eyes: EOMI. No discharge. Cardiovascular: No JVD. Respiratory: Normal effort.  No stridor. GI: Non-distended. Skin: Warm and dry.  Intact. Psych: Normal mood.  Normal behavior. Musc: No edema in extremities.  No tenderness in extremities. Neurologic: Alert Dysarthria Motor: Left upper extremity: Shoulder abduction, elbow flexion/extension 2/5, handgrip 3/5 with apraxia, unchanged Left lower extremity: 3+-4-/5 proximal to distal, unchanged  Assessment/Plan: 1. Functional deficits secondary to Left hemiparesis after Right subcortical infarct which require 3+ hours per day of interdisciplinary therapy in a comprehensive inpatient rehab setting.  Physiatrist is providing close team supervision and 24 hour management of active medical problems listed below.  Physiatrist and rehab team continue to assess barriers to discharge/monitor patient progress toward functional and medical goals  Care Tool:  Bathing    Body parts bathed by patient: Left arm, Chest, Abdomen, Front perineal area, Right  upper leg, Left upper leg, Right lower leg, Face, Left lower leg   Body parts bathed by helper: Buttocks, Right arm     Bathing assist Assist Level: Minimal Assistance - Patient > 75%     Upper Body Dressing/Undressing Upper body dressing   What is the patient wearing?: Pull over shirt    Upper body assist Assist Level: Set up assist    Lower Body Dressing/Undressing Lower body dressing      What is the patient wearing?: Underwear/pull up, Pants     Lower body assist Assist for lower body dressing: Moderate Assistance - Patient 50 - 74%     Toileting Toileting    Toileting assist Assist for toileting: Maximal Assistance - Patient 25 - 49%     Transfers Chair/bed transfer  Transfers assist     Chair/bed transfer assist level: Moderate Assistance - Patient 50 - 74%     Locomotion Ambulation   Ambulation assist      Assist level: Dependent - Patient 0% Assistive device: Lite Gait Max distance: 250   Walk 10 feet activity   Assist     Assist level: Dependent - Patient 0% Assistive device: Lite Gait   Walk 50 feet activity   Assist Walk 50 feet with 2 turns activity did not occur: Safety/medical concerns  Assist level: Dependent - Patient 0% Assistive device: Lite Gait    Walk 150 feet activity   Assist Walk 150 feet activity did not occur: Safety/medical concerns  Assist level: Dependent - Patient 0% Assistive device: Lite Gait    Walk 10 feet on uneven surface  activity   Assist Walk 10 feet on uneven surfaces activity did not occur:  Safety/medical concerns         Wheelchair     Assist   Type of Wheelchair: Manual    Wheelchair assist level: Supervision/Verbal cueing Max wheelchair distance: 150    Wheelchair 50 feet with 2 turns activity    Assist        Assist Level: Supervision/Verbal cueing   Wheelchair 150 feet activity     Assist  Wheelchair 150 feet activity did not occur: Safety/medical  concerns   Assist Level: Supervision/Verbal cueing   Blood pressure (!) 167/60, pulse (!) 59, temperature 98.6 F (37 C), temperature source Oral, resp. rate 16, height 5\' 6"  (1.676 m), weight 86 kg, SpO2 97 %.  Medical Problem List and Plan: 1.  Left-sided weakness secondary to acute perforator infarct at the right basal ganglia corona radiata onset 03/07/2019.  Recommendations for 30-day cardiac event monitor  Continue CIR  WHO ordered nightly 2.  Antithrombotics: -DVT/anticoagulation: Subcutaneous Lovenox             -antiplatelet therapy: Aspirin 81 mg daily, Plavix 75 mg daily x3 weeks then aspirin alone 3. Pain Management: Tylenol as needed 4. Mood: Provide emotional support             -antipsychotic agents: N/A 5. Neuropsych: This patient is capable of making decisions on her own behalf. 6. Skin/Wound Care: Routine skin checks 7. Fluids/Electrolytes/Nutrition: Routine in and outs 8.  Hypertension.    Patient on Lasix 20 mg daily, Cozaar 50 mg daily, Toprol-XL 50 mg daily prior to admission.   Vitals:   03/19/19 2124 03/20/19 0418  BP:  (!) 167/60  Pulse:  (!) 59  Resp:  16  Temp:  98.6 F (37 C)  SpO2: 97% 97%   Cozaar 25 started on 9/17, increased to 50 on 9/19  Remains significantly elevated, consider further increase in medication tomorrow if persistent 9.  Diabetes mellitus with hyperglycemia.  Hemoglobin A1c 7.2. Check blood sugars before meals and at bedtime CBG (last 3)  Recent Labs    03/19/19 1641 03/19/19 2132 03/20/19 0607  GLUCAP 238* 148* 118*   AM CBG controlled with Lantus  47 units   Added glimepiride 1mg  on 9/17  Labile, but?  Stabilizing on 9/20 10.  Hypothyroidism.  Continue supplement 11.  CKD stage III.  Baseline creatinine 1.2-1.35.   Creatinine 1.27 on 9/16  Continue to monitor 12.  Hyperlipidemia.  Patient intolerant with pravastatin and other statins.  Follow-up outpatient with PCP 13.  Constipation:   Stool softener started on 9/19 14.   Acute blood loss anemia  Hemoglobin 11.7 on 9/10, labs ordered for tomorrow  Continue to monitor    LOS: 11 days A FACE TO FACE EVALUATION WAS PERFORMED  [place] Lorie Phenix 03/20/2019, 10:49 AM

## 2019-03-21 ENCOUNTER — Inpatient Hospital Stay (HOSPITAL_COMMUNITY): Payer: Medicare Other | Admitting: Occupational Therapy

## 2019-03-21 ENCOUNTER — Inpatient Hospital Stay (HOSPITAL_COMMUNITY): Payer: Medicare Other

## 2019-03-21 ENCOUNTER — Inpatient Hospital Stay (HOSPITAL_COMMUNITY): Payer: Medicare Other | Admitting: Physical Therapy

## 2019-03-21 LAB — CBC WITH DIFFERENTIAL/PLATELET
Abs Immature Granulocytes: 0.08 10*3/uL — ABNORMAL HIGH (ref 0.00–0.07)
Basophils Absolute: 0.1 10*3/uL (ref 0.0–0.1)
Basophils Relative: 1 %
Eosinophils Absolute: 0.4 10*3/uL (ref 0.0–0.5)
Eosinophils Relative: 6 %
HCT: 35.3 % — ABNORMAL LOW (ref 36.0–46.0)
Hemoglobin: 11.9 g/dL — ABNORMAL LOW (ref 12.0–15.0)
Immature Granulocytes: 1 %
Lymphocytes Relative: 23 %
Lymphs Abs: 1.7 10*3/uL (ref 0.7–4.0)
MCH: 30.2 pg (ref 26.0–34.0)
MCHC: 33.7 g/dL (ref 30.0–36.0)
MCV: 89.6 fL (ref 80.0–100.0)
Monocytes Absolute: 0.5 10*3/uL (ref 0.1–1.0)
Monocytes Relative: 7 %
Neutro Abs: 4.6 10*3/uL (ref 1.7–7.7)
Neutrophils Relative %: 62 %
Platelets: 166 10*3/uL (ref 150–400)
RBC: 3.94 MIL/uL (ref 3.87–5.11)
RDW: 14 % (ref 11.5–15.5)
WBC: 7.3 10*3/uL (ref 4.0–10.5)
nRBC: 0 % (ref 0.0–0.2)

## 2019-03-21 LAB — GLUCOSE, CAPILLARY
Glucose-Capillary: 141 mg/dL — ABNORMAL HIGH (ref 70–99)
Glucose-Capillary: 65 mg/dL — ABNORMAL LOW (ref 70–99)
Glucose-Capillary: 111 mg/dL — ABNORMAL HIGH (ref 70–99)
Glucose-Capillary: 133 mg/dL — ABNORMAL HIGH (ref 70–99)
Glucose-Capillary: 255 mg/dL — ABNORMAL HIGH (ref 70–99)

## 2019-03-21 MED ORDER — GUAIFENESIN-DM 100-10 MG/5ML PO SYRP
5.0000 mL | ORAL_SOLUTION | ORAL | Status: DC | PRN
  Administered 2019-03-21 – 2019-03-27 (×9): 5 mL via ORAL
  Filled 2019-03-21 (×9): qty 5

## 2019-03-21 MED ORDER — LOSARTAN POTASSIUM 50 MG PO TABS
100.0000 mg | ORAL_TABLET | Freq: Every day | ORAL | Status: DC
  Administered 2019-03-22 – 2019-04-01 (×11): 100 mg via ORAL
  Filled 2019-03-21 (×11): qty 2

## 2019-03-21 MED ORDER — LOSARTAN POTASSIUM 50 MG PO TABS
50.0000 mg | ORAL_TABLET | Freq: Once | ORAL | Status: AC
  Administered 2019-03-21: 50 mg via ORAL
  Filled 2019-03-21: qty 1

## 2019-03-21 MED ORDER — GLIMEPIRIDE 2 MG PO TABS
1.0000 mg | ORAL_TABLET | Freq: Once | ORAL | Status: AC
  Administered 2019-03-21: 10:00:00 1 mg via ORAL
  Filled 2019-03-21: qty 1

## 2019-03-21 MED ORDER — GLIMEPIRIDE 2 MG PO TABS
2.0000 mg | ORAL_TABLET | Freq: Every day | ORAL | Status: DC
  Administered 2019-03-22 – 2019-03-23 (×2): 2 mg via ORAL
  Filled 2019-03-21 (×2): qty 1

## 2019-03-21 NOTE — Progress Notes (Signed)
[place] PHYSICAL MEDICINE & REHABILITATION PROGRESS NOTE   Subjective/Complaints: Coughed a lot last night, semi-productive. Feels that the temperature of the room was fluctuating and contributed to it. No cough this morning  ROS: Patient denies fever, rash, sore throat, blurred vision, nausea, vomiting, diarrhea, cough, shortness of breath or chest pain, joint or back pain, headache, or mood change.    Objective:   No results found. Recent Labs    03/21/19 0609  WBC 7.3  HGB 11.9*  HCT 35.3*  PLT 166   No results for input(s): NA, K, CL, CO2, GLUCOSE, BUN, CREATININE, CALCIUM in the last 72 hours.  Intake/Output Summary (Last 24 hours) at 03/21/2019 0902 Last data filed at 03/21/2019 0715 Gross per 24 hour  Intake 562 ml  Output -  Net 562 ml     Physical Exam: Vital Signs Blood pressure (!) 182/70, pulse 67, temperature 98.2 F (36.8 C), temperature source Oral, resp. rate 16, height 5\' 6"  (1.676 m), weight 86 kg, SpO2 97 %. Constitutional: No distress . Vital signs reviewed. HEENT: EOMI, oral membranes moist Neck: supple Cardiovascular: RRR without murmur. No JVD    Respiratory: CTA Bilaterally without wheezes or rales. Normal effort    GI: BS +, non-tender, non-distended  Skin: Warm and dry.  Intact. Psych: very pleasant Musc: No edema in extremities.  No tenderness in extremities. Neurologic: Alert Dysarthria Motor: Left upper extremity: Shoulder abduction, elbow flexion/extension 2/5, handgrip 2- to 2/5 with apraxia Left lower extremity: 3+-4-/5 proximal to distal except ADF which is 1/5 Normal sensation   Assessment/Plan: 1. Functional deficits secondary to Left hemiparesis after Right subcortical infarct which require 3+ hours per day of interdisciplinary therapy in a comprehensive inpatient rehab setting.  Physiatrist is providing close team supervision and 24 hour management of active medical problems listed below.  Physiatrist and rehab team  continue to assess barriers to discharge/monitor patient progress toward functional and medical goals  Care Tool:  Bathing    Body parts bathed by patient: Left arm, Chest, Abdomen, Front perineal area, Right upper leg, Left upper leg, Right lower leg, Face, Left lower leg   Body parts bathed by helper: Buttocks, Right arm     Bathing assist Assist Level: Minimal Assistance - Patient > 75%     Upper Body Dressing/Undressing Upper body dressing   What is the patient wearing?: Pull over shirt    Upper body assist Assist Level: Set up assist    Lower Body Dressing/Undressing Lower body dressing      What is the patient wearing?: Underwear/pull up, Pants     Lower body assist Assist for lower body dressing: Moderate Assistance - Patient 50 - 74%     Toileting Toileting    Toileting assist Assist for toileting: Maximal Assistance - Patient 25 - 49%     Transfers Chair/bed transfer  Transfers assist     Chair/bed transfer assist level: Moderate Assistance - Patient 50 - 74%     Locomotion Ambulation   Ambulation assist      Assist level: Dependent - Patient 0% Assistive device: Lite Gait Max distance: 250   Walk 10 feet activity   Assist     Assist level: Dependent - Patient 0% Assistive device: Lite Gait   Walk 50 feet activity   Assist Walk 50 feet with 2 turns activity did not occur: Safety/medical concerns  Assist level: Dependent - Patient 0% Assistive device: Lite Gait    Walk 150 feet activity   Assist  Walk 150 feet activity did not occur: Safety/medical concerns  Assist level: Dependent - Patient 0% Assistive device: Lite Gait    Walk 10 feet on uneven surface  activity   Assist Walk 10 feet on uneven surfaces activity did not occur: Safety/medical concerns         Wheelchair     Assist   Type of Wheelchair: Manual    Wheelchair assist level: Supervision/Verbal cueing Max wheelchair distance: 150     Wheelchair 50 feet with 2 turns activity    Assist        Assist Level: Supervision/Verbal cueing   Wheelchair 150 feet activity     Assist  Wheelchair 150 feet activity did not occur: Safety/medical concerns   Assist Level: Supervision/Verbal cueing   Blood pressure (!) 182/70, pulse 67, temperature 98.2 F (36.8 C), temperature source Oral, resp. rate 16, height 5\' 6"  (1.676 m), weight 86 kg, SpO2 97 %.  Medical Problem List and Plan: 1.  Left-sided weakness secondary to acute perforator infarct at the right basal ganglia corona radiata onset 03/07/2019.  Recommendations for 30-day cardiac event monitor  Continue CIR  WHO QHS  -?AFO LLE 2.  Antithrombotics: -DVT/anticoagulation: Subcutaneous Lovenox             -antiplatelet therapy: Aspirin 81 mg daily, Plavix 75 mg daily x3 weeks then aspirin alone 3. Pain Management: Tylenol as needed 4. Mood: Provide emotional support             -antipsychotic agents: N/A 5. Neuropsych: This patient is capable of making decisions on her own behalf. 6. Skin/Wound Care: Routine skin checks 7. Fluids/Electrolytes/Nutrition: Routine in and outs 8.  Hypertension.    Patient on Lasix 20 mg daily, Cozaar 50 mg daily, Toprol-XL 50 mg daily prior to admission.   Vitals:   03/20/19 1928 03/21/19 0516  BP: (!) 172/56 (!) 182/70  Pulse: 71 67  Resp: 18 16  Temp: 98 F (36.7 C) 98.2 F (36.8 C)  SpO2: 99% 97%   Cozaar 25 started on 9/17, increased to 50 on 9/19  SBP remains elevated. Increase cozaar to 100mg  9/21 9.  Diabetes mellitus with hyperglycemia.  Hemoglobin A1c 7.2. Check blood sugars before meals and at bedtime CBG (last 3)  Recent Labs    03/20/19 1713 03/20/19 2100 03/21/19 0609  GLUCAP 210* 226* 141*   AM CBG controlled with Lantus  47 units   Added glimepiride 1mg  on 9/17  Control still poor. Increase glimepiride to 2mg  today 10.  Hypothyroidism.  Continue supplement 11.  CKD stage III.  Baseline creatinine  1.2-1.35.   Creatinine 1.27 on 9/16  Continue to monitor 12.  Hyperlipidemia.  Patient intolerant with pravastatin and other statins.  Follow-up outpatient with PCP 13.  Constipation:   Stool softener started on 9/19 14.  Acute blood loss anemia  CBC reviewed. Hemoglobin stable at 11.9 on 9/21  Continue to monitor 15. HS Cough:  -see no signs of allergies  -will add prn robitussin and observe    LOS: 12 days A FACE TO FACE EVALUATION WAS PERFORMED  Meredith Staggers 03/21/2019, 9:02 AM

## 2019-03-21 NOTE — Progress Notes (Signed)
Hypoglycemic Event  CBG: 65  Treatment: 8 oz juice/soda, pt also provided lunch tray.   Symptoms: Vision changes, pt stated she left lightheaded  Follow-up CBG: Time:1232 CBG Result: 111  Possible Reasons for Event: Medication regimen: increased glipizide dose this AM. pt felt she didnt need snack, was working with therapy. educated pt on importance of keeping snack at bedside and having snack in between meals to prevent hypoglycemia.   Comments/MD notified: will notify Dr. Naaman Plummer.     Julia Banks

## 2019-03-21 NOTE — Progress Notes (Signed)
Pt continues to cough throughout the night. Pt throat is scratchy, but relived when using throat lozenges. No other signs of distress, and denies pain. Will continue to monitor.

## 2019-03-21 NOTE — Progress Notes (Signed)
Physical Therapy Session Note  Patient Details  Name: Julia Banks MRN: [id number] Date of Birth: 07/13/1940  Today's Date: 03/21/2019 PT Individual Time: 1106-1202 PT Individual Time Calculation (min): 56 min   Short Term Goals: Week 1:  PT Short Term Goal 1 (Week 1): Pt will perform min assist bed mobility PT Short Term Goal 2 (Week 1): PT will perform wc<>bed with mod assist. PT Short Term Goal 3 (Week 1): Pt will propel WC 186ft with supervision assist PT Short Term Goal 4 (Week 1): Pt will ambulate 86ft with max assist of 1 and LRAD  Skilled Therapeutic Interventions/Progress Updates: Pt presented in bed agreeable to therapy. Pt denies pain throughout session. Performed supine to sit EOB with use of bed features and supervision. Performed stand pivot transfer modA. Pt propelled w/c to rehab gym with supervision for general condiditoning. Performed squat pivot modA as pt indicating increased fatigue this session. Performed blocked practice sit to/from supine with CGA and increased time with cues for log roll and pt able to use RUE to facilitate trunk unto upright position. Pt returned to supine and developed HEP targeting hip. Performed SLR, heel slides, hip abd/add, and sidelying clamshells. All were performed 2 x 10 with multimodal cues for improved control and technique. Pt noted to improve with second set of 10 with each exercises. Upon returning to sitting pt indicated that she felt lightheaded but felt similarly to low BS. Pt transferred to w/c modA and transported back to room. Pt left in w/c at end of session and nsg notified of pt's condition.       Therapy Documentation Precautions:  Precautions Precautions: Fall Precaution Comments: left hemi Restrictions Weight Bearing Restrictions: No General:   Vital Signs: Therapy Vitals Temp: 98 F (36.7 C) Pulse Rate: 68 Resp: 18 BP: (!) 169/64 Patient Position (if appropriate): Lying Oxygen Therapy SpO2: 98 % O2 Device:  Room Air   Therapy/Group: Individual Therapy  Lya Holben  Sheffield Hawker, PTA  03/21/2019, 4:17 PM

## 2019-03-21 NOTE — Progress Notes (Signed)
Occupational Therapy Session Note  Patient Details  Name: Julia Banks MRN: [id number] Date of Birth: 01-09-1941  Today's Date: 03/21/2019 OT Individual Time: [id number] and [id number] OT Individual Time Calculation (min): 57 min and 44 min  Short Term Goals: Week 2:  OT Short Term Goal 1 (Week 2): Pt will perform toileting with mod A overall. OT Short Term Goal 2 (Week 2): Pt will perform LB dressing with mod A overall. OT Short Term Goal 3 (Week 2): Pt will perform bathing at shower level with min A overall.  Skilled Therapeutic Interventions/Progress Updates:    Pt greeted in bed and premedicated for Lt hip pain. Eager for OT. Supine<sit without bedrails and HOB raised (per setup at home) completed with supervision assist! Mod A for stand pivot<w/c afterwards. Pt then engaged in toileting (using elevated toilet), bathing (sitting on TTB, utilizing lateral leans), dressing (w/c level sit<stand at sink), and oral care (standing at sink) during session. All functional transfers completed via Mod A stand pivot with pt able to take a few steps with L LE! Min guard-close supervision for dynamic sitting balance during toileting (with 1 toilet bar lowered) and bathing tasks when completing perihygiene. Used her Lt wash mitt in the shower with Min A to wash her Rt arm. She still exhibits decreased motor control with pt over/undershooting when attempting to wash her Lt thigh using affected UE. Pt able to use seated figure 4 functionally during bathing and dressing tasks. Independent recall of hemi and energy conservation techniques. Min A sit<stand and Mod A for dynamic standing balance while she elevated underwear + pants completely over hips! Min A to fasten pants. Continued working on balance and Lt knee control when standing during oral care. OT facilitated L UE weightbearing at this time. Education regarding one handed techniques is ongoing as well. At end of session she wanted to return to bed to  rest. Left her with all needs within reach and repositioned for comfort. Bed alarm set.      2nd Session 1:1 tx (44 min) Pt greeted in the w/c and ready for tx. Worked on R UE NMR with unweighted dowel rod. Manual assist for grip and support at elbow provided while guiding pt through bicep curls, chest presses, horizontal abb/abduction, and forward/backward rowing. 2 sets 5-10 reps. Reviewed self ROM techniques, emphasizing forearm supination, wrist extension, and digit extension to offset flexor synergy. Mod A stand pivot<elevated toilet where pt voided bladder. Hygiene completed while seated with steady assist. Mod A for clothing mgt x2 with Mod A for balance. Once she returned to the w/c, pt completed transfer back to bed in the same manner. Left her comfortably in bed with all needs within reach.     Therapy Documentation Precautions:  Precautions Precautions: Fall Precaution Comments: left hemi Restrictions Weight Bearing Restrictions: No Pain: Pain Assessment Pain Scale: 0-10 Pain Score: 1  Pain Location: Generalized Pain Descriptors / Indicators: Discomfort Pain Frequency: Intermittent Pain Onset: On-going Patients Stated Pain Goal: 1 Pain Intervention(s): Medication (See eMAR) ADL:      Therapy/Group: Individual Therapy  Eliot Popper A Darius Fillingim 03/21/2019, 12:36 PM

## 2019-03-21 NOTE — Progress Notes (Signed)
Physical Therapy Session Note  Patient Details  Name: Julia Banks MRN: [id number] Date of Birth: 1941/05/18  Today's Date: 03/21/2019 PT Individual Time: [id number] PT Individual Time Calculation (min): 45 min   Short Term Goals: Week 1:  PT Short Term Goal 1 (Week 1): Pt will perform min assist bed mobility PT Short Term Goal 2 (Week 1): PT will perform wc<>bed with mod assist. PT Short Term Goal 3 (Week 1): Pt will propel WC 111ft with supervision assist PT Short Term Goal 4 (Week 1): Pt will ambulate 69ft with max assist of 1 and LRAD  Skilled Therapeutic Interventions/Progress Updates:    Pt supine in bed upon PT arrival, agreeable to therapy tx and denies pain. Therapist donned shoes total assist for time management. Therapist educated pt on L ankle stretching, therapist performed heel cord stretch this session and dicussed wearing PRAFO at night (asked RN for order). Pt transferred to sitting EOB with supervision and performed stand pivot to w/c with mod assist and cues for techniques/foot position. Pt ambulated x 15 ft with RW and mod assist, cues for L foot placement to limit adduction, DF Acewrap donned, pt with poor knee control noted during L stance with hyperextension noted. Pt worked on knee control and LE strength to perform x 10 mini sqauts without UE support and then performed x 2 eccentric holds while lowering to sit. Pt worked on L stance control with therapist blocking knee hyperextension to perform 2 x 10 R lateral steps in place without UE support, min-mod assist. Using the mirror for visual feedback worked on L stance control and limiting hyperextension while performing x 10 R LE steps in place. Pt worked on L LE foot placement with UE support on RW to perform x 10 L LE steps in place, min assist. Therapist donned L knee cage this session and pt ambulated x 15 ft with this, ACE wrap for DF, RW and mod assist, cues for step length and foot placement. Pt transported back to  room and left in w/c with needs in reach and chair alarm set.   Therapy Documentation Precautions:  Precautions Precautions: Fall Precaution Comments: left hemi Restrictions Weight Bearing Restrictions: No    Therapy/Group: Individual Therapy  Netta Corrigan, PT, DPT 03/21/2019, 8:18 AM

## 2019-03-21 NOTE — Plan of Care (Signed)
  Problem: Consults Goal: RH STROKE PATIENT EDUCATION Description: See Patient Education module for education specifics  Outcome: Progressing Goal: Nutrition Consult-if indicated Outcome: Progressing Goal: Diabetes Guidelines if Diabetic/Glucose > 140 Description: If diabetic or lab glucose is > 140 mg/dl - Initiate Diabetes/Hyperglycemia Guidelines & Document Interventions  Outcome: Progressing   Problem: RH BOWEL ELIMINATION Goal: RH STG MANAGE BOWEL WITH ASSISTANCE Description: STG Manage Bowel with min Assistance. Outcome: Progressing Goal: RH STG MANAGE BOWEL W/MEDICATION W/ASSISTANCE Description: STG Manage Bowel with Medication with mod I Assistance. Outcome: Progressing   Problem: RH BLADDER ELIMINATION Goal: RH STG MANAGE BLADDER WITH ASSISTANCE Description: STG Manage Bladder With  min Assistance Outcome: Progressing   Problem: RH SKIN INTEGRITY Goal: RH STG SKIN FREE OF INFECTION/BREAKDOWN Description: With min assist Outcome: Progressing Goal: RH STG MAINTAIN SKIN INTEGRITY WITH ASSISTANCE Description: STG Maintain Skin Integrity With min Assistance. Outcome: Progressing   Problem: RH SAFETY Goal: RH STG ADHERE TO SAFETY PRECAUTIONS W/ASSISTANCE/DEVICE Description: STG Adhere to Safety Precautions With cues/reminders Assistance/Device. Outcome: Progressing Goal: RH STG DECREASED RISK OF FALL WITH ASSISTANCE Description: STG Decreased Risk of Fall With supervision Assistance. Outcome: Progressing   Problem: RH PAIN MANAGEMENT Goal: RH STG PAIN MANAGED AT OR BELOW PT'S PAIN GOAL Description: At or below level 5 Outcome: Progressing   Problem: RH KNOWLEDGE DEFICIT Goal: RH STG INCREASE KNOWLEDGE OF DIABETES Description: Pt will be able to manage DM using handouts and educational information with cues/reminders from family Outcome: Progressing Goal: RH STG INCREASE KNOWLEDGE OF HYPERTENSION Description: Pt will be able to manage HTN using handouts and  educational information with cues/reminders from family Outcome: Progressing Goal: RH STG INCREASE KNOWLEGDE OF HYPERLIPIDEMIA Description: Pt will be able to manage HLD using handouts and educational information with cues/reminders from family Outcome: Progressing Goal: RH STG INCREASE KNOWLEDGE OF STROKE PROPHYLAXIS Description: Pt will be able to manage secondary stroke prevention using handouts and educational information with cues/reminders from family Outcome: Progressing   Problem: RH Vision Goal: RH LTG Vision (Specify) Outcome: Progressing   Problem: RH Pre-functional/Other (Specify) Goal: RH LTG Pre-functional (Specify) Outcome: Progressing Goal: RH LTG Interdisciplinary (Specify) 1 Description: RH LTG Interdisciplinary (Specify)1 Outcome: Progressing Goal: RH LTG Interdisciplinary (Specify) 2 Description: RH LTG Interdisciplinary (Specify) 2  Outcome: Progressing

## 2019-03-22 ENCOUNTER — Encounter (HOSPITAL_COMMUNITY): Payer: Medicare Other | Admitting: Psychology

## 2019-03-22 ENCOUNTER — Inpatient Hospital Stay (HOSPITAL_COMMUNITY): Payer: Medicare Other | Admitting: Occupational Therapy

## 2019-03-22 ENCOUNTER — Inpatient Hospital Stay (HOSPITAL_COMMUNITY): Payer: Medicare Other | Admitting: Physical Therapy

## 2019-03-22 ENCOUNTER — Inpatient Hospital Stay (HOSPITAL_COMMUNITY): Payer: Medicare Other

## 2019-03-22 DIAGNOSIS — I169 Hypertensive crisis, unspecified: Secondary | ICD-10-CM

## 2019-03-22 LAB — GLUCOSE, CAPILLARY
Glucose-Capillary: 124 mg/dL — ABNORMAL HIGH (ref 70–99)
Glucose-Capillary: 119 mg/dL — ABNORMAL HIGH (ref 70–99)
Glucose-Capillary: 123 mg/dL — ABNORMAL HIGH (ref 70–99)
Glucose-Capillary: 175 mg/dL — ABNORMAL HIGH (ref 70–99)

## 2019-03-22 NOTE — Consult Note (Signed)
Neuropsychological Consultation   Patient:   Julia Banks   DOB:   09/22/1940  MR Number:  [id number]  Location:  [facility] [id number] [place] [address] Dept: [place]: [phone]           Date of Service:   03/22/2019  Start Time:   2 PM End Time:   3 PM  Provider/Observer:  Ilean Skill, Psy.D.       Clinical Neuropsychologist       Billing Code/Service: [phone]  Chief Complaint:    Julia Banks is a 78 year old female with a history of hypertension, hyperlipidemia, diabetes, hearing loss, CAD, CKD stage III.  The patient presented on 03/07/2019 with left-sided weakness as well as mild dysarthria.  The patient reports that she had been having pains in the back of her neck for several days and rubbing them.  The patient did not receive TPA.  MRI showed acute perforator infarct at the right basal ganglia and corona radiata.  The patient was then admitted to the comprehensive rehabilitation program due to functional motor deficits.  Reason for Service:  The patient was referred for neuropsychological consultation due to coping and adjustment issues following her recent basal ganglia and surrounding area stroke.  Below is the HPI for the current admission.  HPI: Julia Banks is a 78 year old right-handed female with history of hypertension, hyperlipidemia, diabetes mellitus, sensorineural hearing loss, CAD, CKD stage III.  Per chart review patient lives alone independent prior to admission.  2 level home with bed and bath on main level 4 steps to entry.  Presented 03/07/2019 with left side weakness as well as mild dysarthria.  Cranial CT scan negative.  Patient did not receive TPA.  CT angiogram of head and neck with no emergent findings.  MRI showed acute perforator infarct at the right basal ganglia and corona radiata.  Echocardiogram with ejection fraction of [id number] normal  systolic function.  Neurology follow-up maintained on aspirin and Plavix for CVA prophylaxis x3 weeks then aspirin alone.  Subcutaneous Lovenox for DVT prophylaxis.  Plan for 30-day cardiac event monitor.  Tolerating a regular diet.  Speech therapy has signed off.  Therapy evaluations completed and patient was admitted for a comprehensive rehab program.  Current Status:  The patient reports that she is coping fairly well even though this was a sudden change for her.  She had been worrying that she had done something that had spurred this on and we addressed that issue and worked on coping status.  The patient denies any significant depression or anxiety but does acknowledge that is stressful with extended hospital stay and not being able to be around her family.  Behavioral Observation: Julia Banks  presents as a 78 y.o.-year-old Right Caucasian Female who appeared her stated age. her dress was Appropriate and she was Well Groomed and her manners were Appropriate to the situation.  her participation was indicative of Appropriate and Attentive behaviors.  There were any physical disabilities noted.  she displayed an appropriate level of cooperation and motivation.     Interactions:    Active Appropriate and Attentive  Attention:   within normal limits and attention span and concentration were age appropriate  Memory:   within normal limits; recent and remote memory intact  Visuo-spatial:  not examined  Speech (Volume):  normal  Speech:   normal; normal  Thought Process:  Coherent and Relevant  Though  Content:  WNL; not suicidal and not homicidal  Orientation:   person, place, time/date and situation  Judgment:   Good  Planning:   Good  Affect:    Appropriate  Mood:    Dysphoric  Insight:   Good  Intelligence:   normal   Medical History:   Past Medical History:  Diagnosis Date  . Arthritis   . Asthma   . Breast cancer, left breast ([place]) 02/2014   DCIS, ER+ PR- initially  rec bilat mastectomy with sentinal LN biopsy L axilla but now planning just L mastectomy [name])  . Chronic kidney disease, stage 3 ([place])   . Colon polyps    benign  . Complication of anesthesia    hard to wake up  . Coronary artery disease    30% stenosis reported on previous cardiac catheterization in 2006. This was done in [facility]  . GERD (gastroesophageal reflux disease)   . Headaches, cluster   . Hyperlipidemia   . Hypertension   . Hypothyroidism   . Osteopenia 07/2014   DEXA 0.5 spine, 01.7 R femoral neck  . Pneumonia    years ago  . Secondary hyperparathyroidism of renal origin ([place])    likely  . Sensorineural hearing loss (SNHL) of both ears 01/27/2016   Audiology eval - moderately severe sensorineural hearing loss bilaterally rec amplification (07/2016 - [name] audiologist)  . Sleep apnea    on CPAP  . Thyroid disease   . Type 2 diabetes, uncontrolled, with neuropathy ([place])    followed by endo Dr Buddy Duty   Psychiatric History:  The patient denies any significant prior psychiatric history.  Family Med/Psych History:  Family History  Problem Relation Age of Onset  . Arthritis Mother   . Hyperlipidemia Mother   . Stroke Mother   . Hypertension Mother   . Diabetes Mother   . Other Mother        parathyroid adenoma  . Arthritis Father   . Hyperlipidemia Father   . CAD Father        MI  . Hypertension Father   . Breast cancer Other   . Ovarian cancer Other   . Leukemia Brother        dx in his 85s  . Colon cancer Maternal Uncle        dx in his 49s  . Ovarian cancer Maternal Grandmother 90  . Melanoma Other 27       dx 2x - 28 and 29/dx 3x - ages 65, 37, and 65  . Cancer Neg Hx   . Crohn's disease Neg Hx   . Colon polyps Neg Hx   . Ulcerative colitis Neg Hx   . Stomach cancer Neg Hx   . Rectal cancer Neg Hx     Impression/DX:  Julia Banks is a 78 year old female with a history of hypertension, hyperlipidemia,  diabetes, hearing loss, CAD, CKD stage III.  The patient presented on 03/07/2019 with left-sided weakness as well as mild dysarthria.  The patient reports that she had been having pains in the back of her neck for several days and rubbing them.  The patient did not receive TPA.  MRI showed acute perforator infarct at the right basal ganglia and corona radiata.  The patient was then admitted to the comprehensive rehabilitation program due to functional motor deficits.  The patient reports that she is coping fairly well even though this was a sudden change for her.  She had  been worrying that she had done something that had spurred this on and we addressed that issue and worked on coping status.  The patient denies any significant depression or anxiety but does acknowledge that is stressful with extended hospital stay and not being able to be around her family.  Disposition/Plan:  Today we worked on coping and adjustment issues and the patient does appear to be doing well with regard to adjusting to the recent cerebrovascular accident resulting hemiparesis.  The patient does not appear to be dealing with significant depression or anxiety.  Diagnosis:    Cerebrovascular accident (CVA) of right basal ganglia ([place]) - Plan: Ambulatory referral to Neurology         Electronically Signed   _______________________ Ilean Skill, Psy.D.

## 2019-03-22 NOTE — Progress Notes (Signed)
Physical Therapy Session Note  Patient Details  Name: AMARISA WILINSKI MRN: [id number] Date of Birth: 1941-04-05  Today's Date: 03/22/2019 PT Individual Time: 1304-1400 PT Individual Time Calculation (min): 56 min   Short Term Goals: Week 1:  PT Short Term Goal 1 (Week 1): Pt will perform min assist bed mobility PT Short Term Goal 2 (Week 1): PT will perform wc<>bed with mod assist. PT Short Term Goal 3 (Week 1): Pt will propel WC 18f with supervision assist PT Short Term Goal 4 (Week 1): Pt will ambulate 344fwith max assist of 1 and LRAD Week 2:     Skilled Therapeutic Interventions/Progress Updates: Pt presented in bed agreeable to therapy. Pt requesting to use bathroom prior to leaving room. Performed bed mobility with supervision and use of bed features. Performed squat pivot to w/c minA near CGA. Required modA for stand pivot transfer to toilet with use of wall rail and modA for clothing management. Pt required maxA once completed to pull pants over hips once achieved standing and modA for transfer back to w/c. PTA donned AFO/shoes total A for time management. Pt transported to dayroom for energy conservation. Performed gait training 2522f 2 with modA requiring increased time and verbal cues for LLE placement due to ataxia. Knee cage added on second bout for biofeedback due to moderate snapping of knee into hyperextension. Pt with decreased wt shifiting to L noted and required verbal cues for safety with RW. Performed standing weight shifting activities with mirror feedback. Initially with wt shifting L/R with manual facilitation at hips, progressing to RLE marching only with tactile cues at hips to stabilize, progressing to standing march all at 10 reps each. Pt initially requiring mod/max cues for weight shifting and hip stabilization with improved technique with visual feedback and repetition. Pt transported back to room and performed stand pivot transfer modA with RW with pt returning to  supine with supervision and increased time. Pt repositioned self to comfort and left with bed alarm on, call bell within reach and needs met.      Therapy Documentation Precautions:  Precautions Precautions: Fall Precaution Comments: left hemi Restrictions Weight Bearing Restrictions: No General:   Vital Signs: Therapy Vitals Temp: 98.1 F (36.7 C) Pulse Rate: (!) 59 Resp: 18 BP: (!) 183/67 Patient Position (if appropriate): Lying Oxygen Therapy SpO2: 97 % O2 Device: Room Air    Therapy/Group: Individual Therapy  Mikinzie Maciejewski  Christien Berthelot, PTA  03/22/2019, 7:42 AM

## 2019-03-22 NOTE — Progress Notes (Signed)
Physical Therapy Weekly Progress Note  Patient Details  Name: Julia Banks MRN: [id number] Date of Birth: [birth year]  Beginning of progress report period: March 10, 2019 End of progress report period: March 22, 2019  Today's Date: 03/22/2019   Patient has met 4 of 4 short term goals.  Pt has made progress during current course of therapy. She continues to demonstrate deficits in LLE weakness and coordination which has limited progression in ambulation. Pt noted to continues to have L knee instability however has with introduction of knee cage for feedback has improved mechanics.    Patient continues to demonstrate the following deficits muscle weakness, muscle joint tightness and muscle paralysis and impaired timing and sequencing, abnormal tone, unbalanced muscle activation, decreased coordination and decreased motor planning and therefore will continue to benefit from skilled PT intervention to increase functional independence with mobility.  Patient progressing toward long term goals..  Continue plan of care.  PT Short Term Goals Week 1:  PT Short Term Goal 1 (Week 1): Pt will perform min assist bed mobility PT Short Term Goal 1 - Progress (Week 1): Met PT Short Term Goal 2 (Week 1): PT will perform wc<>bed with mod assist. PT Short Term Goal 2 - Progress (Week 1): Met PT Short Term Goal 3 (Week 1): Pt will propel WC 176f with supervision assist PT Short Term Goal 3 - Progress (Week 1): Met PT Short Term Goal 4 (Week 1): Pt will ambulate 373fwith max assist of 1 and LRAD PT Short Term Goal 4 - Progress (Week 1): Met Week 2:  PT Short Term Goal 1 (Week 2): STG=LTG due to ELOS  Skilled Therapeutic Interventions/Progress Updates:      Therapy Documentation Precautions:  Precautions Precautions: Fall Precaution Comments: left hemi Restrictions Weight Bearing Restrictions: No General:   Vital Signs: Therapy Vitals Temp: 98.1 F (36.7 C) Pulse Rate: (!)  59 Resp: 18 BP: (!) 183/67 Patient Position (if appropriate): Lying Oxygen Therapy SpO2: 97 % O2 Device: Room Air Pain:   Vision/Perception     Mobility:   Locomotion :    Trunk/Postural Assessment :    Balance:   Exercises:   Other Treatments:     Therapy/Group: Individual Therapy  Rosita DeChalus 03/22/2019, 7:50 AM

## 2019-03-22 NOTE — Progress Notes (Signed)
[facility] PHYSICAL MEDICINE & REHABILITATION PROGRESS NOTE   Subjective/Complaints: Patient seen sitting up in a chair working with therapy this morning.  She states she slept fairly overnight.  She notes an improving cough.  ROS: + Improving cough.  Denies CP, SOB, N/V/D  Objective:   No results found. Recent Labs    03/21/19 0609  WBC 7.3  HGB 11.9*  HCT 35.3*  PLT 166   No results for input(s): NA, K, CL, CO2, GLUCOSE, BUN, CREATININE, CALCIUM in the last 72 hours.  Intake/Output Summary (Last 24 hours) at 03/22/2019 0841 Last data filed at 03/22/2019 0715 Gross per 24 hour  Intake 462 ml  Output -  Net 462 ml     Physical Exam: Vital Signs Blood pressure (!) 183/67, pulse (!) 59, temperature 98.1 F (36.7 C), resp. rate 18, height 5\' 6"  (1.676 m), weight 86 kg, SpO2 97 %. Constitutional: No distress . Vital signs reviewed. HENT: Normocephalic.  Atraumatic. Eyes: EOMI. No discharge. Cardiovascular: No JVD. Respiratory: Normal effort.  No stridor. GI: Non-distended. Skin: Warm and dry.  Intact. Psych: Normal mood.  Normal behavior. Musc: No edema in extremities.  No tenderness in extremities. Neurologic: Alert Dysarthria Motor: Left upper extremity: Shoulder abduction, elbow flexion/extension 2/5, handgrip 2-/5 with apraxia Left lower extremity: 4-/5 hip flexion, knee extension, 1/5 ankle dorsiflexion  Assessment/Plan: 1. Functional deficits secondary to Left hemiparesis after Right subcortical infarct which require 3+ hours per day of interdisciplinary therapy in a comprehensive inpatient rehab setting.  Physiatrist is providing close team supervision and 24 hour management of active medical problems listed below.  Physiatrist and rehab team continue to assess barriers to discharge/monitor patient progress toward functional and medical goals  Care Tool:  Bathing    Body parts bathed by patient: Left arm, Chest, Abdomen, Front perineal area, Right upper  leg, Left upper leg, Right lower leg, Face, Left lower leg, Buttocks   Body parts bathed by helper: Right arm     Bathing assist Assist Level: Minimal Assistance - Patient > 75%     Upper Body Dressing/Undressing Upper body dressing   What is the patient wearing?: Pull over shirt    Upper body assist Assist Level: Set up assist    Lower Body Dressing/Undressing Lower body dressing      What is the patient wearing?: Underwear/pull up, Pants     Lower body assist Assist for lower body dressing: Moderate Assistance - Patient 50 - 74%     Toileting Toileting    Toileting assist Assist for toileting: Moderate Assistance - Patient 50 - 74%     Transfers Chair/bed transfer  Transfers assist     Chair/bed transfer assist level: Moderate Assistance - Patient 50 - 74%     Locomotion Ambulation   Ambulation assist      Assist level: Moderate Assistance - Patient 50 - 74% Assistive device: Walker-rolling Max distance: 15 ft   Walk 10 feet activity   Assist     Assist level: Moderate Assistance - Patient - 50 - 74% Assistive device: Walker-rolling   Walk 50 feet activity   Assist Walk 50 feet with 2 turns activity did not occur: Safety/medical concerns  Assist level: Dependent - Patient 0% Assistive device: Lite Gait    Walk 150 feet activity   Assist Walk 150 feet activity did not occur: Safety/medical concerns  Assist level: Dependent - Patient 0% Assistive device: Lite Gait    Walk 10 feet on uneven surface  activity  Assist Walk 10 feet on uneven surfaces activity did not occur: Safety/medical concerns         Wheelchair     Assist   Type of Wheelchair: Manual    Wheelchair assist level: Supervision/Verbal cueing Max wheelchair distance: 150    Wheelchair 50 feet with 2 turns activity    Assist        Assist Level: Supervision/Verbal cueing   Wheelchair 150 feet activity     Assist  Wheelchair 150 feet  activity did not occur: Safety/medical concerns   Assist Level: Supervision/Verbal cueing   Blood pressure (!) 183/67, pulse (!) 59, temperature 98.1 F (36.7 C), resp. rate 18, height 5\' 6"  (1.676 m), weight 86 kg, SpO2 97 %.  Medical Problem List and Plan: 1.  Left-sided weakness secondary to acute perforator infarct at the right basal ganglia corona radiata onset 03/07/2019.  Recommendations for 30-day cardiac event monitor  Cont CIR  PRAFO ordered  WHO QHS 2.  Antithrombotics: -DVT/anticoagulation: Subcutaneous Lovenox             -antiplatelet therapy: Aspirin 81 mg daily, Plavix 75 mg daily x3 weeks (~9/28) then aspirin alone 3. Pain Management: Tylenol as needed 4. Mood: Provide emotional support             -antipsychotic agents: N/A 5. Neuropsych: This patient is capable of making decisions on her own behalf. 6. Skin/Wound Care: Routine skin checks 7. Fluids/Electrolytes/Nutrition: Routine in and outs 8.  Hypertension.    Patient on Lasix 20 mg daily, Cozaar 50 mg daily, Toprol-XL 50 mg daily prior to admission.   Vitals:   03/21/19 2159 03/22/19 0425  BP:  (!) 183/67  Pulse: 65 (!) 59  Resp: 18 18  Temp:  98.1 F (36.7 C)  SpO2:  97%   Cozaar 25 started on 9/17, increased to 50 on 9/19, increased to 100 on 9/21  Remains extremely elevated on 9/22 with SBP greater than 180 9.  Diabetes mellitus with hyperglycemia.  Hemoglobin A1c 7.2. Check blood sugars before meals and at bedtime CBG (last 3)  Recent Labs    03/21/19 1634 03/21/19 2117 03/22/19 0622  GLUCAP 133* 255* 124*   Lantus  47 units   Added glimepiride 1mg  on 9/17, increased to 2 mg on 9/21  Labile on 9/22 10.  Hypothyroidism.  Continue supplement 11.  CKD stage III.  Baseline creatinine 1.2-1.35.   Creatinine 1.27 on 9/1 1, labs ordered for tomorrow  Continue to monitor 12.  Hyperlipidemia.  Patient intolerant with pravastatin and other statins.  Follow-up outpatient with PCP 13.  Constipation:    Stool softener started on 9/19 14.  Acute blood loss anemia  Hemoglobin 11.9 on 9/21  Continue to monitor 15. HS Cough:  Added prn robitussin   ?  Improving   LOS: 13 days A FACE TO FACE EVALUATION WAS PERFORMED  Ankit Lorie Phenix 03/22/2019, 8:41 AM

## 2019-03-22 NOTE — Plan of Care (Signed)
  Problem: Consults Goal: RH STROKE PATIENT EDUCATION Description: See Patient Education module for education specifics  Outcome: Progressing Goal: Nutrition Consult-if indicated Outcome: Progressing Goal: Diabetes Guidelines if Diabetic/Glucose > 140 Description: If diabetic or lab glucose is > 140 mg/dl - Initiate Diabetes/Hyperglycemia Guidelines & Document Interventions  Outcome: Progressing   Problem: RH BOWEL ELIMINATION Goal: RH STG MANAGE BOWEL WITH ASSISTANCE Description: STG Manage Bowel with min Assistance. Outcome: Progressing Goal: RH STG MANAGE BOWEL W/MEDICATION W/ASSISTANCE Description: STG Manage Bowel with Medication with mod I Assistance. Outcome: Progressing   Problem: RH BLADDER ELIMINATION Goal: RH STG MANAGE BLADDER WITH ASSISTANCE Description: STG Manage Bladder With  min Assistance Outcome: Progressing   Problem: RH SKIN INTEGRITY Goal: RH STG SKIN FREE OF INFECTION/BREAKDOWN Description: With min assist Outcome: Progressing Goal: RH STG MAINTAIN SKIN INTEGRITY WITH ASSISTANCE Description: STG Maintain Skin Integrity With min Assistance. Outcome: Progressing   Problem: RH SAFETY Goal: RH STG ADHERE TO SAFETY PRECAUTIONS W/ASSISTANCE/DEVICE Description: STG Adhere to Safety Precautions With cues/reminders Assistance/Device. Outcome: Progressing Goal: RH STG DECREASED RISK OF FALL WITH ASSISTANCE Description: STG Decreased Risk of Fall With supervision Assistance. Outcome: Progressing   Problem: RH PAIN MANAGEMENT Goal: RH STG PAIN MANAGED AT OR BELOW PT'S PAIN GOAL Description: At or below level 5 Outcome: Progressing   Problem: RH KNOWLEDGE DEFICIT Goal: RH STG INCREASE KNOWLEDGE OF DIABETES Description: Pt will be able to manage DM using handouts and educational information with cues/reminders from family Outcome: Progressing Goal: RH STG INCREASE KNOWLEDGE OF HYPERTENSION Description: Pt will be able to manage HTN using handouts and  educational information with cues/reminders from family Outcome: Progressing Goal: RH STG INCREASE KNOWLEGDE OF HYPERLIPIDEMIA Description: Pt will be able to manage HLD using handouts and educational information with cues/reminders from family Outcome: Progressing Goal: RH STG INCREASE KNOWLEDGE OF STROKE PROPHYLAXIS Description: Pt will be able to manage secondary stroke prevention using handouts and educational information with cues/reminders from family Outcome: Progressing

## 2019-03-22 NOTE — Progress Notes (Signed)
Occupational Therapy Session Note  Patient Details  Name: Julia Banks MRN: [id number] Date of Birth: [birth year]  Today's Date: 03/22/2019 OT Individual Time: [id number] and 1515-1600 OT Individual Time Calculation (min): 57 min  And 45 min Today's Date: 03/22/2019    Short Term Goals: Week 2:  OT Short Term Goal 1 (Week 2): Pt will perform toileting with mod A overall. OT Short Term Goal 2 (Week 2): Pt will perform LB dressing with mod A overall. OT Short Term Goal 3 (Week 2): Pt will perform bathing at shower level with min A overall.  Skilled Therapeutic Interventions/Progress Updates:   Session 1: Upon entering the room, pt supine in bed awaiting OT arrival. Pt declined bathing but requesting to change clothing items at sink. Supine >sit with supervision to EOB. Mod A stand pivot transfer into wheelchair. Pt seated in wheelchair at sink for grooming tasks with supervision and increased time. Pt donning clothing items with min cuing for hemiplegic technique. Figure four position to thread feet into pants. Pt performed sit <>stand with mod A and needing min A for standing balance and use of mirror for visual feedback to pull clothing items over B hips. Pt remained in wheelchair, OT reviewed scheduled and team agreed to discontinue chair alarm. Call bell and all needed items within reach upon exiting the room.   Session 2: Upon entering the room, pt supine in bed with no c/o pain. However, pt reports extreme fatigues from  therapy interventions. OT agreeable to address L UE NMR from bed level. Focus on AAROM and PROM in all planes of movements x 10 reps each. Pt needing cuing for pursed lip breathing with exercise. Pt remained supine in bed at end of session with call bell and all needed items within reach. Bed alarm activated.   Therapy Documentation Precautions:  Precautions Precautions: Fall Precaution Comments: left hemi Restrictions Weight Bearing Restrictions: No   Pain: Pain  Assessment Pain Scale: 0-10 Pain Score: 0-No pain Other Treatments:     Therapy/Group: Individual Therapy  Gypsy Decant 03/22/2019, 8:56 AM

## 2019-03-22 NOTE — Progress Notes (Signed)
Orthopedic Tech Progress Note Patient Details:  Julia Banks 01/21/1941 [id number] Called in order to [employer] for a PRAFO boot Patient ID: Julia Banks, female   DOB: 06-07-1941, 78 y.o.   MRN: [id number]   [name] 03/22/2019, 9:57 AM

## 2019-03-23 ENCOUNTER — Inpatient Hospital Stay (HOSPITAL_COMMUNITY): Payer: Medicare Other | Admitting: Physical Therapy

## 2019-03-23 ENCOUNTER — Inpatient Hospital Stay (HOSPITAL_COMMUNITY): Payer: Medicare Other | Admitting: Occupational Therapy

## 2019-03-23 DIAGNOSIS — E162 Hypoglycemia, unspecified: Secondary | ICD-10-CM

## 2019-03-23 LAB — GLUCOSE, CAPILLARY
Glucose-Capillary: 70 mg/dL (ref 70–99)
Glucose-Capillary: 151 mg/dL — ABNORMAL HIGH (ref 70–99)
Glucose-Capillary: 86 mg/dL (ref 70–99)
Glucose-Capillary: 136 mg/dL — ABNORMAL HIGH (ref 70–99)
Glucose-Capillary: 284 mg/dL — ABNORMAL HIGH (ref 70–99)

## 2019-03-23 LAB — CREATININE, SERUM
Creatinine, Ser: 1.31 mg/dL — ABNORMAL HIGH (ref 0.44–1.00)
GFR calc Af Amer: 46 mL/min — ABNORMAL LOW (ref >60)
GFR calc non Af Amer: 39 mL/min — ABNORMAL LOW (ref >60)

## 2019-03-23 MED ORDER — AMLODIPINE BESYLATE 5 MG PO TABS
5.0000 mg | ORAL_TABLET | Freq: Every day | ORAL | Status: DC
  Administered 2019-03-23 – 2019-03-25 (×3): 5 mg via ORAL
  Filled 2019-03-23 (×3): qty 1

## 2019-03-23 NOTE — Progress Notes (Signed)
Occupational Therapy Session Note  Patient Details  Name: Julia Banks MRN: [id number] Date of Birth: Jan 04, 1941  Today's Date: 03/23/2019 OT Individual Time: [id number] OT Individual Time Calculation (min): 40 min    Short Term Goals: Week 2:  OT Short Term Goal 1 (Week 2): Pt will perform toileting with mod A overall. OT Short Term Goal 2 (Week 2): Pt will perform LB dressing with mod A overall. OT Short Term Goal 3 (Week 2): Pt will perform bathing at shower level with min A overall.  Skilled Therapeutic Interventions/Progress Updates:    Upon entering the room, pt supine in bed with no c/o pain and agreeable to OT intervention. Pt performed supine >sit with supervision to EOB. Squat pivot transfer with min A into wheelchair. Pt seated at sink in wheelchair with grooming tasks for supervision. Pt standing with mod A for balance while washing buttocks and peri area. Pt's L knee buckling while standing. Pt needing mod A for standing balance during LB clothing management. Pt transferred back to bed for comfort with min A squat pivot. Call bell and all needed items within reach upon exiting the room.   Therapy Documentation Precautions:  Precautions Precautions: Fall Precaution Comments: left hemi Restrictions Weight Bearing Restrictions: No General: General PT Missed Treatment Reason: Other (Comment) Vital Signs: Therapy Vitals Temp: 98.3 F (36.8 C) Temp Source: Oral Pulse Rate: 70 Resp: 18 BP: (!) 170/67 Patient Position (if appropriate): Lying Oxygen Therapy SpO2: 99 % O2 Device: Room Air   Therapy/Group: Individual Therapy  Gypsy Decant 03/23/2019, 4:47 PM

## 2019-03-23 NOTE — Progress Notes (Signed)
Physical Therapy Session Note  Patient Details  Name: Julia Banks MRN: [id number] Date of Birth: 06-09-1941  Today's Date: 03/23/2019 PT Individual Time: 0915-1000 PT Individual Time Calculation (min): 45 min   Short Term Goals: Week 1:  PT Short Term Goal 1 (Week 1): Pt will perform min assist bed mobility PT Short Term Goal 1 - Progress (Week 1): Met PT Short Term Goal 2 (Week 1): PT will perform wc<>bed with mod assist. PT Short Term Goal 2 - Progress (Week 1): Met PT Short Term Goal 3 (Week 1): Pt will propel WC 158f with supervision assist PT Short Term Goal 3 - Progress (Week 1): Met PT Short Term Goal 4 (Week 1): Pt will ambulate 356fwith max assist of 1 and LRAD PT Short Term Goal 4 - Progress (Week 1): Met Week 2:  PT Short Term Goal 1 (Week 2): STG=LTG due to ELOS  Skilled Therapeutic Interventions/Progress Updates:  Pt supine in bed upon arrival for therapy. Pt notes low blood sugar earlier this morning and is agreeable to PT treatment session. Pt transferred supine>sitting EOB with supervision/verbal cueing. SPT donned socks, shoes, and L trial AFO. Pt performed sit>stand with RW and minA and transferred to wheelchair. Pt transported to therapy gym. Pt ambulated 5058f with RW and modA and verbal cueing for coordination and step width.Then PT donned swedish knee cage and pt ambulated 42f81fth RW with minA. Pt required cueing for coordination and step width. Pt appears to be more coordinated with ambulation with a visual target on the floor. PT trialed various AFOs and 2# ankle weight (to improve proprioception) for L LE throughout session. L Rigid Anterior Support AFO worked best. Pt was transported to hallway. Pt ambulated 50 feet back to room/bed with RW and minA. Pt requested to rest in bed, so pt transferred from wheelchair>bed with RW and minA. Pt positioned in bed with pillows, bed alarm set, and call bell and tray table within reach and all needs met at this time.      Therapy Documentation Precautions:  Precautions Precautions: Fall Precaution Comments: left hemi Restrictions Weight Bearing Restrictions: No   Pain: Denies pain     Therapy/Group: Individual Therapy  KrisOlena LeatherwoodT  03/23/2019, 11:56 AM

## 2019-03-23 NOTE — Patient Care Conference (Signed)
Inpatient RehabilitationTeam Conference and Plan of Care Update Date: 03/23/2019   Time: 10:45 AM    Patient Name: Julia Banks      Medical Record Number: [id number]  Date of Birth: [birth year] Sex: Female         Room/Bed: 4W02C/4W02C-01 Payor Info: Payor: MEDICARE / Plan: MEDICARE PART A AND B / Product Type: *No Product type* /    Admit Date/Time:  03/09/2019  4:05 PM  Primary Diagnosis:  Cerebrovascular accident (CVA) of right basal ganglia [facility])  Patient Active Problem List   Diagnosis Date Noted  . Hypoglycemia   . Hypertensive crisis   . Acute blood loss anemia   . Labile blood glucose   . Controlled type 2 diabetes mellitus with hyperglycemia, with long-term current use of insulin ([place])   . Essential hypertension   . Cerebrovascular accident (CVA) of right basal ganglia ([place]) 03/09/2019  . Hemiparesis affecting left side as late effect of cerebrovascular accident (CVA) ([place]) 03/09/2019  . CVA (cerebral vascular accident) ([place]) 03/08/2019  . Ischemic stroke ([place]) 03/07/2019  . CKD (chronic kidney disease), stage III ([place]) 03/07/2019  . Advanced care planning/counseling discussion 01/27/2016  . Sensorineural hearing loss (SNHL) of both ears 01/27/2016  . Chest discomfort 01/27/2016  . Insomnia 02/13/2015  . Chronic cough 10/10/2014  . Osteopenia 07/31/2014  . S/P mastectomy 06/07/2014  . Genetic testing 06/02/2014  . Breast cancer of upper-outer quadrant of left female breast ([place]) 03/28/2014  . Grieving 10/25/2013  . Secondary hyperparathyroidism of renal origin ([place])   . Medicare annual wellness visit, subsequent 06/28/2013  . Diabetes mellitus with stage 3 chronic kidney disease ([place])   . Hypothyroidism 04/13/2013  . GERD (gastroesophageal reflux disease)   . Essential hypertension, benign 09/17/2012  . HLD (hyperlipidemia) 09/17/2012  . Type 2 diabetes, uncontrolled, with neuropathy ([place]) 02/15/2012  . OSA on CPAP 02/13/2012  . Coronary artery disease  02/13/2012    Expected Discharge Date: Expected Discharge Date: 04/01/19  Team Members Present: Physician leading conference: Dr. Delice Lesch Social Worker Present: Ovidio Kin, LCSW Nurse Present: Rosita Fire, RN PT Present: Barrie Folk, PT;Rosita Dechalus, PTA OT Present: Darleen Crocker, OT SLP Present: Charolett Bumpers, SLP PPS Coordinator present : Gunnar Fusi, SLP     Current Status/Progress Goal Weekly Team Focus  Bowel/Bladder   continent b/b; LBM: 09/22  remain continent of b/b  assist with tolieting needs prn   Swallow/Nutrition/ Hydration             ADL's   Min A bathing at shower level using leans, setup UB dressing, Mod A LB dressing, Mod A stand pivot bathroom transfers, Mod A toileting. Lt knee control is improving during functional standing. L UE with proximal>distal activation, more active movement noted from tx last week  Supervision overall  NMR, functional transfers, standing balance, ADL retraining, pt/family education   Mobility   supervision bed mobility, modA stand pivot transfer with RW, min/modA squat pivot transfer pending fatigue level, modA gait 54ft with RW demonstrates L knee instability and decreased coordination of LLE with decreased wt on LLE.  Supervision transfers. Supervision gait.  gait training, L NMR, balance, endurance   Communication             Safety/Cognition/ Behavioral Observations            Pain   c/o generalized pain; prn tylenol  Pain <2/10  assess pain level QS and prn   Skin   ecchymosis bil UE  remain free  of new skin breakdown/infection  assess skin QS and prn      *See Care Plan and progress notes for long and short-term goals.     Barriers to Discharge  Current Status/Progress Possible Resolutions Date Resolved   Nursing                  PT                    OT                  SLP                SW                Discharge Planning/Teaching Needs:  Family to provide 24 hr care, will need family  education prior to DC home. Niece very involved and arranging care for pt      Team Discussion:  Progresisng in her therapies. PT downgrading goals to CGA due to left knee stability and progress. MD ordered [facility] for leg. L-knee control better. Still tends to lean to the right. R-shoulder pain MD addressing. Currently min-mod level of care.   Revisions to Treatment Plan:  DC 10/2    Medical Summary Current Status: Left-sided weakness secondary to acute perforator infarct at the right basal ganglia corona radiata onset 03/07/2019. Weekly Focus/Goal: Improve mobility, self-care, DM/HTN, bracing, CKD  Barriers to Discharge: Medical stability;Weight   Possible Resolutions to Barriers: Therapies, optimize BP/DM meds, follow labs   Continued Need for Acute Rehabilitation Level of Care: The patient requires daily medical management by a physician with specialized training in physical medicine and rehabilitation for the following reasons: Direction of a multidisciplinary physical rehabilitation program to maximize functional independence : Yes Medical management of patient stability for increased activity during participation in an intensive rehabilitation regime.: Yes Analysis of laboratory values and/or radiology reports with any subsequent need for medication adjustment and/or medical intervention. : Yes   I attest that I was present, lead the team conference, and concur with the assessment and plan of the team. Teleconference held due to COVID 19   [name], Gardiner Rhyme 03/23/2019, 1:22 PM

## 2019-03-23 NOTE — Progress Notes (Addendum)
Hypoglycemic Event  CBG: 70  Treatment: Breakfast eaten  Symptoms: asymptomatic  Follow-up CBG: Time: 0715 CBG Result: 151  Possible Reasons for Event: increase in glimepiride during the day and high lantus dose before bed.  Comments/MD notified: PA Doristine Locks

## 2019-03-23 NOTE — Progress Notes (Signed)
[place] PHYSICAL MEDICINE & REHABILITATION PROGRESS NOTE   Subjective/Complaints: Patient seen sitting up in bed this morning working with therapies.  She states she slept well overnight.  She notes continued improvement with cough.  Spoke to therapist and nursing regarding symptomatic hypoglycemia this AM.  She notes that her breasts were not placed overnight.  ROS: + Improving cough.  Denies CP, SOB, N/V/D  Objective:   No results found. Recent Labs    03/21/19 0609  WBC 7.3  HGB 11.9*  HCT 35.3*  PLT 166   Recent Labs    03/23/19 0705  CREATININE 1.31*    Intake/Output Summary (Last 24 hours) at 03/23/2019 0900 Last data filed at 03/23/2019 0840 Gross per 24 hour  Intake 582 ml  Output -  Net 582 ml     Physical Exam: Vital Signs Blood pressure (!) 181/59, pulse 63, temperature 97.8 F (36.6 C), temperature source Oral, resp. rate 18, height 5\' 6"  (1.676 m), weight 86 kg, SpO2 97 %. Constitutional: No distress . Vital signs reviewed. HENT: Normocephalic.  Atraumatic. Eyes: EOMI. No discharge. Cardiovascular: No JVD. Respiratory: Normal effort.  No stridor. GI: Non-distended. Skin: Warm and dry.  Intact. Psych: Normal mood.  Normal behavior. Musc: No edema in extremities.  No tenderness in extremities. Neurologic: Alert Dysarthria Motor: Left upper extremity: Shoulder abduction, elbow flexion/extension 2/5, handgrip 2-/5 with apraxia, unchanged Left lower extremity: 4-/5 hip flexion, knee extension, 1/5 ankle dorsiflexion, unchanged  Assessment/Plan: 1. Functional deficits secondary to Left hemiparesis after Right subcortical infarct which require 3+ hours per day of interdisciplinary therapy in a comprehensive inpatient rehab setting.  Physiatrist is providing close team supervision and 24 hour management of active medical problems listed below.  Physiatrist and rehab team continue to assess barriers to discharge/monitor patient progress toward functional  and medical goals  Care Tool:  Bathing    Body parts bathed by patient: Left arm, Chest, Abdomen, Front perineal area, Right upper leg, Left upper leg, Right lower leg, Face, Left lower leg, Buttocks   Body parts bathed by helper: Right arm     Bathing assist Assist Level: Minimal Assistance - Patient > 75%     Upper Body Dressing/Undressing Upper body dressing   What is the patient wearing?: Pull over shirt    Upper body assist Assist Level: Set up assist    Lower Body Dressing/Undressing Lower body dressing      What is the patient wearing?: Underwear/pull up, Pants     Lower body assist Assist for lower body dressing: Moderate Assistance - Patient 50 - 74%     Toileting Toileting    Toileting assist Assist for toileting: Moderate Assistance - Patient 50 - 74%     Transfers Chair/bed transfer  Transfers assist     Chair/bed transfer assist level: Moderate Assistance - Patient 50 - 74%     Locomotion Ambulation   Ambulation assist      Assist level: Moderate Assistance - Patient 50 - 74% Assistive device: Walker-rolling Max distance: 31ft   Walk 10 feet activity   Assist     Assist level: Moderate Assistance - Patient - 50 - 74% Assistive device: Walker-rolling   Walk 50 feet activity   Assist Walk 50 feet with 2 turns activity did not occur: Safety/medical concerns  Assist level: Dependent - Patient 0% Assistive device: Lite Gait    Walk 150 feet activity   Assist Walk 150 feet activity did not occur: Safety/medical concerns  Assist level: Dependent -  Patient 0% Assistive device: Lite Gait    Walk 10 feet on uneven surface  activity   Assist Walk 10 feet on uneven surfaces activity did not occur: Safety/medical concerns         Wheelchair     Assist   Type of Wheelchair: Manual    Wheelchair assist level: Supervision/Verbal cueing Max wheelchair distance: 150    Wheelchair 50 feet with 2 turns  activity    Assist        Assist Level: Supervision/Verbal cueing   Wheelchair 150 feet activity     Assist  Wheelchair 150 feet activity did not occur: Safety/medical concerns   Assist Level: Supervision/Verbal cueing   Blood pressure (!) 181/59, pulse 63, temperature 97.8 F (36.6 C), temperature source Oral, resp. rate 18, height 5\' 6"  (1.676 m), weight 86 kg, SpO2 97 %.  Medical Problem List and Plan: 1.  Left-sided weakness secondary to acute perforator infarct at the right basal ganglia corona radiata onset 03/07/2019.  Recommendations for 30-day cardiac event monitor  Cont CIR  PRAFO pending, discussed with nursing will follow-up  WHO QHS  Team conference today to discuss current and goals and coordination of care, home and environmental barriers, and discharge planning with nursing, case manager, and therapies.  2.  Antithrombotics: -DVT/anticoagulation: Subcutaneous Lovenox             -antiplatelet therapy: Aspirin 81 mg daily, Plavix 75 mg daily x3 weeks (~9/28) then aspirin alone 3. Pain Management: Tylenol as needed 4. Mood: Provide emotional support             -antipsychotic agents: N/A 5. Neuropsych: This patient is capable of making decisions on her own behalf. 6. Skin/Wound Care: Routine skin checks 7. Fluids/Electrolytes/Nutrition: Routine in and outs 8.  Hypertension.    Patient on Lasix 20 mg daily, Cozaar 50 mg daily, Toprol-XL 50 mg daily prior to admission.   Vitals:   03/23/19 0409 03/23/19 0734  BP: (!) 181/59   Pulse: 63   Resp: 18   Temp: 97.8 F (36.6 C)   SpO2: 99% 97%   Cozaar 25 started on 9/17, increased to 50 on 9/19, increased to 100 on 9/21  Norvasc 5 started on 9/23  Remains extremely elevated on 9/23 with hypertensive crisis 9.  Diabetes mellitus with hyperglycemia.  Hemoglobin A1c 7.2. Check blood sugars before meals and at bedtime CBG (last 3)  Recent Labs    03/22/19 2102 03/23/19 0629 03/23/19 0715  GLUCAP 175* 70 151*    Lantus  47 units   Added glimepiride 1mg  on 9/17, increased to 2 mg on 9/21, DC'd on 9/23  Symptomatic hypoglycemia on 9/23, will need to monitor closely as patient already received a.m. dose of Amaryl.  Discussed with nursing to ensure patient receives bedtime snack tonight. 10.  Hypothyroidism.  Continue supplement 11.  CKD stage III.  Baseline creatinine 1.2-1.35.   Creatinine 1.31 on 9/23, labs ordered for tomorrow  Continue to monitor 12.  Hyperlipidemia.  Patient intolerant with pravastatin and other statins.  Follow-up outpatient with PCP 13.  Constipation:   Stool softener started on 9/19 14.  Acute blood loss anemia  Hemoglobin 11.9 on 9/21  Continue to monitor 15. HS Cough:  Added prn robitussin   Improving   LOS: 14 days A FACE TO FACE EVALUATION WAS PERFORMED   Lorie Phenix 03/23/2019, 9:00 AM

## 2019-03-23 NOTE — Plan of Care (Signed)
  Problem: Consults Goal: RH STROKE PATIENT EDUCATION Description: See Patient Education module for education specifics  Outcome: Progressing Goal: Nutrition Consult-if indicated Outcome: Progressing   Problem: RH BOWEL ELIMINATION Goal: RH STG MANAGE BOWEL WITH ASSISTANCE Description: STG Manage Bowel with min Assistance. Outcome: Progressing Goal: RH STG MANAGE BOWEL W/MEDICATION W/ASSISTANCE Description: STG Manage Bowel with Medication with mod I Assistance. Outcome: Progressing   Problem: RH BLADDER ELIMINATION Goal: RH STG MANAGE BLADDER WITH ASSISTANCE Description: STG Manage Bladder With  min Assistance Outcome: Progressing   Problem: RH SKIN INTEGRITY Goal: RH STG SKIN FREE OF INFECTION/BREAKDOWN Description: With min assist Outcome: Progressing Goal: RH STG MAINTAIN SKIN INTEGRITY WITH ASSISTANCE Description: STG Maintain Skin Integrity With min Assistance. Outcome: Progressing   Problem: RH SAFETY Goal: RH STG ADHERE TO SAFETY PRECAUTIONS W/ASSISTANCE/DEVICE Description: STG Adhere to Safety Precautions With cues/reminders Assistance/Device. Outcome: Progressing   Problem: RH PAIN MANAGEMENT Goal: RH STG PAIN MANAGED AT OR BELOW PT'S PAIN GOAL Description: At or below level 5 Outcome: Progressing   Problem: RH KNOWLEDGE DEFICIT Goal: RH STG INCREASE KNOWLEDGE OF DIABETES Description: Pt will be able to manage DM using handouts and educational information with cues/reminders from family Outcome: Progressing Goal: RH STG INCREASE KNOWLEDGE OF HYPERTENSION Description: Pt will be able to manage HTN using handouts and educational information with cues/reminders from family Outcome: Progressing Goal: RH STG INCREASE KNOWLEGDE OF HYPERLIPIDEMIA Description: Pt will be able to manage HLD using handouts and educational information with cues/reminders from family Outcome: Progressing Goal: RH STG INCREASE KNOWLEDGE OF STROKE PROPHYLAXIS Description: Pt will be able  to manage secondary stroke prevention using handouts and educational information with cues/reminders from family Outcome: Progressing   Problem: RH SAFETY Goal: RH STG DECREASED RISK OF FALL WITH ASSISTANCE Description: STG Decreased Risk of Fall With supervision Assistance. Outcome: Not Applicable

## 2019-03-23 NOTE — Progress Notes (Signed)
Physical Therapy Session Note  Patient Details  Name: Julia Banks MRN: [id number] Date of Birth: 10-12-1940  Today's Date: 03/23/2019 PT Individual Time: 1103-1200 and 2641-5830 PT Individual Time Calculation (min): 57 min and 38 min  Short Term Goals: Week 2:  PT Short Term Goal 1 (Week 2): STG=LTG due to ELOS  Skilled Therapeutic Interventions/Progress Updates: Tx1:Pt presented in bed agreeable to therapy. Performed bed mobility supervision with use of bed features. Performed stand pivot transfer with RW with modA and cues for L foot placement. Pt propelled to rehab gym supervision via hemi technique. Performed stand pivot transfer to mat leading to R modA with verbal cues for sequencing. Participated in STS with RLE on 2in step for forced recruitment of LLE. Performed x5 from 21in, x 3 from 20in, and x 4 from 19in, initially requiring minA fading to [place] without AD. Performed toe taps to target 2 x 10 pt requiring verbal cues for increasing wt shift to L when returning L foot to neutral. Then performed with .75lb wt on LLE with improved feedback and placement. Attempted ambulation 31f with .75lb cuff on LLE, pt noted to have improved coordination although intermittently hitting LLE against front wheel. Pt propelled back to room supervision and performed stand pivot transfer to bed with RW and minA with verbal cues for safety with RW. Pt returned to supine supervision with use of bed features. Pt repositioned to comfort and left with bed alarm on, call bell within reach and needs met.   Tx2: Pt presented in bed agreeable to therapy. Performed bed mobility with use of bed features and supervision. Performed stand pivot transfer to w/c to L with modA and requiring mod multimodal cues for safety with RW and sequencing. Pt transported to rehab gym and performed gait/NMR activities sidestepping x 8 ft x 4 bouts. Pt initially required modA and cues for sequencing but improved sequencing and  coordination with repetition. Pt transported to day room and performed stand pivot to NuStep minA. Participated in NuStep L1 x 5 min with L hand orthotic for reciprocal activity and general conditioning. Performed stand pivot to w/c once completed in same manner as prior and transported back to room. Pt requesting to return to bed, performed stand pivot to R minA. Performed sit to supine with supervision and use of bed features. Pt repositioned to comfort and left with bed alarm on, call bell within reach and needs met.      Therapy Documentation Precautions:  Precautions Precautions: Fall Precaution Comments: left hemi Restrictions Weight Bearing Restrictions: No General:   Vital Signs: Therapy Vitals Pulse Rate: 65 BP: (!) 174/57 Patient Position (if appropriate): Lying Oxygen Therapy SpO2: 97 % Pain: Pain Assessment Pain Score: 2    Therapy/Group: Individual Therapy  Laaibah Wartman 03/23/2019, 12:48 PM

## 2019-03-24 ENCOUNTER — Inpatient Hospital Stay (HOSPITAL_COMMUNITY): Payer: Medicare Other | Admitting: *Deleted

## 2019-03-24 ENCOUNTER — Inpatient Hospital Stay (HOSPITAL_COMMUNITY): Payer: Medicare Other | Admitting: Physical Therapy

## 2019-03-24 ENCOUNTER — Inpatient Hospital Stay (HOSPITAL_COMMUNITY): Payer: Medicare Other | Admitting: Rehabilitation

## 2019-03-24 ENCOUNTER — Inpatient Hospital Stay (HOSPITAL_COMMUNITY): Payer: Medicare Other | Admitting: Occupational Therapy

## 2019-03-24 LAB — BASIC METABOLIC PANEL
Anion gap: 10 (ref 5–15)
BUN: 19 mg/dL (ref 8–23)
CO2: 23 mmol/L (ref 22–32)
Calcium: 9 mg/dL (ref 8.9–10.3)
Chloride: 105 mmol/L (ref 98–111)
Creatinine, Ser: 1.31 mg/dL — ABNORMAL HIGH (ref 0.44–1.00)
GFR calc Af Amer: 46 mL/min — ABNORMAL LOW (ref >60)
GFR calc non Af Amer: 39 mL/min — ABNORMAL LOW (ref >60)
Glucose, Bld: 146 mg/dL — ABNORMAL HIGH (ref 70–99)
Potassium: 4.1 mmol/L (ref 3.5–5.1)
Sodium: 138 mmol/L (ref 135–145)

## 2019-03-24 LAB — GLUCOSE, CAPILLARY
Glucose-Capillary: 126 mg/dL — ABNORMAL HIGH (ref 70–99)
Glucose-Capillary: 142 mg/dL — ABNORMAL HIGH (ref 70–99)
Glucose-Capillary: 162 mg/dL — ABNORMAL HIGH (ref 70–99)
Glucose-Capillary: 162 mg/dL — ABNORMAL HIGH (ref 70–99)

## 2019-03-24 NOTE — Progress Notes (Signed)
Social Work Patient ID: Julia Banks, female   DOB: 10/16/1940, 78 y.o.   MRN: [id number]  Met with pt and spoke with niece-michelle to discuss team conference goals supervision-CGA assist. Some of PT goals have been downgraded. Niece would like to come in for education this weekend due to can only miss so much work. Will see about working this out. Work on discharge needs.

## 2019-03-24 NOTE — Evaluation (Signed)
Recreational Therapy Assessment and Plan  Patient Details  Name: Julia Banks MRN: [id number] Date of Birth: May 23, 1941 Today's Date: 03/24/2019 Time:  05-1131 Pain:  No c/o  Rehab Potential: Good ELOS: 2 weeks   Assessment  Problem List:      Patient Active Problem List   Diagnosis Date Noted  . Cerebrovascular accident (CVA) of right basal ganglia ([place]) 03/09/2019  . Hemiparesis affecting left side as late effect of cerebrovascular accident (CVA) ([place]) 03/09/2019  . CVA (cerebral vascular accident) ([place]) 03/08/2019  . Ischemic stroke ([place]) 03/07/2019  . CKD (chronic kidney disease), stage III ([place]) 03/07/2019  . Advanced care planning/counseling discussion 01/27/2016  . Sensorineural hearing loss (SNHL) of both ears 01/27/2016  . Chest discomfort 01/27/2016  . Insomnia 02/13/2015  . Chronic cough 10/10/2014  . Osteopenia 07/31/2014  . S/P mastectomy 06/07/2014  . Genetic testing 06/02/2014  . Breast cancer of upper-outer quadrant of left female breast ([place]) 03/28/2014  . Grieving 10/25/2013  . Secondary hyperparathyroidism of renal origin ([place])   . Medicare annual wellness visit, subsequent 06/28/2013  . Diabetes mellitus with stage 3 chronic kidney disease ([place])   . Hypothyroidism 04/13/2013  . GERD (gastroesophageal reflux disease)   . Essential hypertension, benign 09/17/2012  . HLD (hyperlipidemia) 09/17/2012  . Type 2 diabetes, uncontrolled, with neuropathy ([place]) 02/15/2012  . OSA on CPAP 02/13/2012  . Coronary artery disease 02/13/2012    Past Medical History:      Past Medical History:  Diagnosis Date  . Arthritis   . Asthma   . Breast cancer, left breast ([place]) 02/2014   DCIS, ER+ PR- initially rec bilat mastectomy with sentinal LN biopsy L axilla but now planning just L mastectomy [name])  . Chronic kidney disease, stage 3 ([place])   . Colon polyps    benign  . Complication of anesthesia    hard to wake up  . Coronary artery disease     30% stenosis reported on previous cardiac catheterization in 2006. This was done in [facility]  . GERD (gastroesophageal reflux disease)   . Headaches, cluster   . Hyperlipidemia   . Hypertension   . Hypothyroidism   . Osteopenia 07/2014   DEXA 0.5 spine, 01.7 R femoral neck  . Pneumonia    years ago  . Secondary hyperparathyroidism of renal origin ([place])    likely  . Sensorineural hearing loss (SNHL) of both ears 01/27/2016   Audiology eval - moderately severe sensorineural hearing loss bilaterally rec amplification (07/2016 - [name] audiologist)  . Sleep apnea    on CPAP  . Thyroid disease   . Type 2 diabetes, uncontrolled, with neuropathy ([place])    followed by endo Dr Buddy Duty   Past Surgical History:       Past Surgical History:  Procedure Laterality Date  . ABDOMINAL HYSTERECTOMY     heavy bleeding, ovaries remain  . APPENDECTOMY    . BREAST BIOPSY Right    benign  . BREAST BIOPSY Left 02/2014   ?DCIS  . CARDIAC CATHETERIZATION  2006   [place], [place].   . CARDIAC CATHETERIZATION  10/2013   mild 2v nonobstructive disease, EF 60%  . CARDIOVASCULAR STRESS TEST  09/2012   WNL ([place])  . CHOLECYSTECTOMY    . COLONOSCOPY  05/2010   2 TAs, diverticulosis, rpt 2 yrs (Dr Elissa Hefty in [place])  . COLONOSCOPY  12/2014   1 TA, rpt 5 yrs Henrene Pastor)  . cortisone right knee  01/04/15  .  GALLBLADDER SURGERY    . MASTECTOMY W/ SENTINEL NODE BIOPSY Left 06/07/2014   DR WAKEFIELD  . SIMPLE MASTECTOMY WITH AXILLARY SENTINEL NODE BIOPSY Left 06/07/2014   Procedure: LEFT TOTAL MASTECTOMY WITH LEFT AXILLARY SENTINEL NODE BIOPSY;  Surgeon: Rolm Bookbinder, MD;  Location: [facility] OR;  Service: General;  Laterality: Left;    Assessment & Plan Clinical Impression: Patient is a 78 y.o. year old female with history of hypertension, hyperlipidemia, diabetes mellitus, sensorineural hearing loss, CAD, CKD stage III. Per chart review  patient lives alone independent prior to admission. 2 level home with bed and bath on main level 4 steps to entry. Presented 03/07/2019 with left side weakness as well as mild dysarthria. Cranial CT scan negative. Patient did not receive TPA. CT angiogram of head and neck with no emergent findings. MRI showed acute perforator infarct at the right basal ganglia and corona radiata. Echocardiogram with ejection fraction of 00% normal systolic function. Neurology follow-up maintained on aspirin and Plavix for CVA prophylaxis x3 weeks then aspirin alone. Subcutaneous Lovenox for DVT prophylaxis. Plan for 30-day cardiac event monitor. Tolerating a regular diet.  Patient transferred to CIR on 03/09/2019.    Pt presents with decreased activity tolerance, decreased functional mobility, decreased balance, decreased coordination Limiting pt's independence with leisure/community pursuits.   Leisure History/Participation Premorbid leisure interest/current participation: Petra Kuba - Flower gardening;Nature - [place] care;Crafts - Painting;Crafts - Knitting/Crocheting;Community - Travel (Comment);Community - Building control surveyor - Designer, jewellery Expression Interests: Music (Comment) Other Leisure Interests: Television;Reading Leisure Participation Style: Alone;With Family/Friends Awareness of Community Resources: Excellent Psychosocial / Spiritual Stress Management: (P) Good Does patient have pets?: Yes(2 dogs) Social interaction - Mood/Behavior: Cooperative Academic librarian Appropriate for Education?: Yes Recreational Therapy Orientation Orientation -Reviewed with patient: Available activity resources Strengths/Weaknesses Patient Strengths/Abilities: Willingness to participate;Active premorbidly Patient weaknesses: Physical limitations TR Patient demonstrates impairments in the following area(s): Endurance;Motor;Safety  Plan Rec Therapy Plan Is patient appropriate for Therapeutic Recreation?:  Yes Rehab Potential: Good Treatment times per week: Min 1 TR session >20 minutes during LOS Estimated Length of Stay: 2 weeks TR Treatment/Interventions: Adaptive equipment instruction;1:1 session;Balance/vestibular training;Functional mobility training;Community reintegration;Patient/family education;Recreation/leisure participation;Therapeutic activities;Therapeutic exercise;UE/LE Coordination activities;Wheelchair propulsion/positioning  Recommendations for other services: Neuropsych  Discharge Criteria: Patient will be discharged from TR if patient refuses treatment 3 consecutive times without medical reason.  If treatment goals not met, if there is a change in medical status, if patient makes no progress towards goals or if patient is discharged from hospital.  The above assessment, treatment plan, treatment alternatives and goals were discussed and mutually agreed upon: by patient  [place] 03/24/2019, 3:43 PM

## 2019-03-24 NOTE — Progress Notes (Signed)
[place] PHYSICAL MEDICINE & REHABILITATION PROGRESS NOTE   Subjective/Complaints: Patient seen sitting up in bed this morning about work with therapies.  She states she slept well overnight.  She notes continued improvement with cough.  She notes that she received and wore her orthoses overnight.  ROS: + Improving cough.  Denies CP, SOB, N/V/D  Objective:   No results found. No results for input(s): WBC, HGB, HCT, PLT in the last 72 hours. Recent Labs    03/23/19 0705 03/24/19 0555  NA  --  138  K  --  4.1  CL  --  105  CO2  --  23  GLUCOSE  --  146*  BUN  --  19  CREATININE 1.31* 1.31*  CALCIUM  --  9.0    Intake/Output Summary (Last 24 hours) at 03/24/2019 0831 Last data filed at 03/24/2019 0727 Gross per 24 hour  Intake 1200 ml  Output -  Net 1200 ml     Physical Exam: Vital Signs Blood pressure (!) 146/59, pulse 65, temperature 98.1 F (36.7 C), temperature source Oral, resp. rate 18, height 5\' 6"  (1.676 m), weight 86 kg, SpO2 97 %. Constitutional: No distress . Vital signs reviewed. HENT: Normocephalic.  Atraumatic. Eyes: EOMI. No discharge. Cardiovascular: No JVD. Respiratory: Normal effort.  No stridor. GI: Non-distended. Skin: Warm and dry.  Intact. Psych: Normal mood.  Normal behavior. Musc: No edema in extremities.  No tenderness in extremities. Neurologic: Alert Dysarthria Motor: Left upper extremity: Shoulder abduction, elbow flexion/extension 2/5, handgrip 2-/5 with apraxia, stable Left lower extremity: 4-/5 hip flexion, knee extension, 1/5 ankle dorsiflexion, stable  Assessment/Plan: 1. Functional deficits secondary to Left hemiparesis after Right subcortical infarct which require 3+ hours per day of interdisciplinary therapy in a comprehensive inpatient rehab setting.  Physiatrist is providing close team supervision and 24 hour management of active medical problems listed below.  Physiatrist and rehab team continue to assess barriers to  discharge/monitor patient progress toward functional and medical goals  Care Tool:  Bathing    Body parts bathed by patient: Front perineal area, Buttocks   Body parts bathed by helper: Right arm     Bathing assist Assist Level: Moderate Assistance - Patient 50 - 74%     Upper Body Dressing/Undressing Upper body dressing   What is the patient wearing?: Pull over shirt    Upper body assist Assist Level: Set up assist    Lower Body Dressing/Undressing Lower body dressing      What is the patient wearing?: Underwear/pull up, Pants     Lower body assist Assist for lower body dressing: Moderate Assistance - Patient 50 - 74%     Toileting Toileting    Toileting assist Assist for toileting: Moderate Assistance - Patient 50 - 74%     Transfers Chair/bed transfer  Transfers assist     Chair/bed transfer assist level: Minimal Assistance - Patient > 75%     Locomotion Ambulation   Ambulation assist      Assist level: Moderate Assistance - Patient 50 - 74% Assistive device: Walker-rolling Max distance: 93ft   Walk 10 feet activity   Assist     Assist level: Moderate Assistance - Patient - 50 - 74% Assistive device: Walker-rolling   Walk 50 feet activity   Assist Walk 50 feet with 2 turns activity did not occur: Safety/medical concerns  Assist level: Moderate Assistance - Patient - 50 - 74% Assistive device: Walker-rolling    Walk 150 feet activity  Assist Walk 150 feet activity did not occur: Safety/medical concerns  Assist level: Dependent - Patient 0% Assistive device: Lite Gait    Walk 10 feet on uneven surface  activity   Assist Walk 10 feet on uneven surfaces activity did not occur: Safety/medical concerns         Wheelchair     Assist   Type of Wheelchair: Manual    Wheelchair assist level: Supervision/Verbal cueing Max wheelchair distance: 150    Wheelchair 50 feet with 2 turns activity    Assist         Assist Level: Supervision/Verbal cueing   Wheelchair 150 feet activity     Assist  Wheelchair 150 feet activity did not occur: Safety/medical concerns   Assist Level: Supervision/Verbal cueing   Blood pressure (!) 146/59, pulse 65, temperature 98.1 F (36.7 C), temperature source Oral, resp. rate 18, height 5\' 6"  (1.676 m), weight 86 kg, SpO2 97 %.  Medical Problem List and Plan: 1.  Left-sided weakness secondary to acute perforator infarct at the right basal ganglia corona radiata onset 03/07/2019.  Recommendations for 30-day cardiac event monitor  Continue CIR  PRAFO nightly  WHO QHS 2.  Antithrombotics: -DVT/anticoagulation: Subcutaneous Lovenox             -antiplatelet therapy: Aspirin 81 mg daily, Plavix 75 mg daily x3 weeks (~9/28) then aspirin alone 3. Pain Management: Tylenol as needed 4. Mood: Provide emotional support             -antipsychotic agents: N/A 5. Neuropsych: This patient is capable of making decisions on her own behalf. 6. Skin/Wound Care: Routine skin checks 7. Fluids/Electrolytes/Nutrition: Routine in and outs 8.  Hypertension.    Patient on Lasix 20 mg daily, Cozaar 50 mg daily, Toprol-XL 50 mg daily prior to admission.   Vitals:   03/23/19 1926 03/24/19 0354  BP: (!) 178/52 (!) 146/59  Pulse: 70 65  Resp:  18  Temp: 98.5 F (36.9 C) 98.1 F (36.7 C)  SpO2: 96% 97%   Cozaar 25 started on 9/17, increased to 50 on 9/19, increased to 100 on 9/21  Norvasc 5 started on 9/23  Remains elevated, but?  Improving on 9/24 9.  Diabetes mellitus with hyperglycemia.  Hemoglobin A1c 7.2. Check blood sugars before meals and at bedtime CBG (last 3)  Recent Labs    03/23/19 1618 03/23/19 2102 03/24/19 0616  GLUCAP 136* 284* 126*   Lantus  47 units   Added glimepiride 1mg  on 9/17, increased to 2 mg on 9/21, DC'd on 9/24  Remains labile on 9/24 10.  Hypothyroidism.  Continue supplement 11.  CKD stage III.  Baseline creatinine 1.2-1.35.   Creatinine  1.31 on 9/24  Continue to monitor 12.  Hyperlipidemia.  Patient intolerant with pravastatin and other statins.  Follow-up outpatient with PCP 13.  Constipation:   Stool softener started on 9/19 14.  Acute blood loss anemia  Hemoglobin 11.9 on 9/21  Continue to monitor 15. HS Cough:  Added prn robitussin   Improving   LOS: 15 days A FACE TO FACE EVALUATION WAS PERFORMED  Jairus Tonne Lorie Phenix 03/24/2019, 8:31 AM

## 2019-03-24 NOTE — Progress Notes (Signed)
Social Work Patient ID: Julia Banks, female   DOB: 1941-01-04, 78 y.o.   MRN: [id number]    Diagnosis codes: I63.9 & E11.65  Height:  5'6              Weight:  189 lbs          Patient suffers from R-CVA  which impairs her ability to perform daily activities like ADL's and tolieting   in the home.  A walker  will not resolve issue with performing activities of daily living.  A wheelchair will allow patient to safely perform daily activities.  Patient is not able to propel themselves in the home using a standard weight wheelchair due to endurance and fatigue .  Patient can self propel in the lightweight wheelchair.

## 2019-03-24 NOTE — Progress Notes (Signed)
Physical Therapy Session Note  Patient Details  Name: Julia Banks MRN: [id number] Date of Birth: Nov 12, 1940  Today's Date: 03/24/2019 PT Individual Time: 1000-1100 PT Individual Time Calculation (min): 60 min   Short Term Goals: Week 2:  PT Short Term Goal 1 (Week 2): STG=LTG due to ELOS  Skilled Therapeutic Interventions/Progress Updates:   Pt received sitting in WC and agreeable to PT. Pt transported to rehab gym in [facility]. Gait training with L AFO, L hand orthotic, and toe cap on the LLE; min assist c 63f with min cues for improved erect posture, step width bil, and AD management in turns.  Variable gait training to force use of LLE in various planes, forward/reverse 2 x 131fwith min assist. Side stepping R and L 2 x 1051fith min-mod assist. . Moderate multimodal instruction for AD management, sequencing, improved terminal knee extension on the R and activation of the L gluteal activation.   PT instructed pt in stair negotiation training, 3in x 8 and 6in x 4+3 with 1 UE support, min assist and min cues for step to gait pattern and UE placement to prevent L LOB. Pt ascended and descend forward on first 2 bouts and then descending posteriorly on 3rd bout. Pt required mild increase in cues for awareness of LLE posteriorly, but no additional assist.   WC mobility with supervision assist and Hemi propulsion technique x 150f66fth min cues for turn technique. Patient returned to room and [facility] in reach and all needs met.   .         Therapy Documentation Precautions:  Precautions Precautions: Fall Precaution Comments: left hemi Restrictions Weight Bearing Restrictions: No Vital Signs: Therapy Vitals Pulse Rate: 72 Resp: 16 BP: (!) 165/61 Patient Position (if appropriate): Sitting Oxygen Therapy SpO2: 97 % O2 Device: Room Air Pain: denies   Therapy/Group: Individual Therapy  AustLorie Phenix4/2020, 11:04 AM

## 2019-03-24 NOTE — Progress Notes (Signed)
Physical Therapy Session Note  Patient Details  Name: Julia Banks MRN: [id number] Date of Birth: 04-21-1941  Today's Date: 03/24/2019 PT Individual Time: 1301-1405 PT Individual Time Calculation (min): 64 min   Short Term Goals: Week 1:  PT Short Term Goal 1 (Week 1): Pt will perform min assist bed mobility PT Short Term Goal 1 - Progress (Week 1): Met PT Short Term Goal 2 (Week 1): PT will perform wc<>bed with mod assist. PT Short Term Goal 2 - Progress (Week 1): Met PT Short Term Goal 3 (Week 1): Pt will propel WC 159f with supervision assist PT Short Term Goal 3 - Progress (Week 1): Met PT Short Term Goal 4 (Week 1): Pt will ambulate 359fwith max assist of 1 and LRAD PT Short Term Goal 4 - Progress (Week 1): Met Week 2:  PT Short Term Goal 1 (Week 2): STG=LTG due to ELOS  Skilled Therapeutic Interventions/Progress Updates:   Pt received sitting in w/c, agreeable to therapy.  Assisted to therapy gym total A for time management.  Once in therapy gym, PT demonstrated getting into tall kneeling with use of Kaye bench.  Assisted pt into standing from w/c at min A level with LEs against treatment mat.  Then into tall kneeling on mat with support on bench at min/mod A level.  Session focused on NMR for LUE/LE as well as improved postural control.  While in tall kneeling, PT provided support at LUE on Kaye bench for weight bearing while performing weight shifting laterally, reaching task with RUE x 15 reps, and also L lateral weight shift with side step out with RLE and back to midline x 10 reps (2 sets).  Provided light tactile facilitation at L pelvis for improved protraction during loading.  Performed mini squats in tall kneel without UE support x 10 reps with cues for improved control on L side.  Progressed to R half kneel with R UE reaches x 5 reps again with light tactile cues at chest for posture and at L pelvis for improved glute activation.  Returned to seated position on mat with  focus continuing on LUE/LE forced used/weight bearing with PT assisting LUE into slight abd, ER, and wrist ext while weight shifting to the L and back to midline.  Progressed to PT sitting in front of pt in arm chair placing pts LUE on arm rest (again into extension to assist with loading during transitional movements), performing mini stands (maintaining slight squat), then reaching with RUE across midline to the L, again to load LUE/LE x 10 reps.  From there, wanted to load LUE more, therefore placed LUE on Kaye bench placed in front of her, again elevating to mini stand and reaching for objects with RUE placing to the L.  Pt with great LUE/LE control during task, esp with repetition.  Ended session with gait with RW, anterior L AFO, knee cage and simulated toe cap x 100' at min A level.  Note that pt does great job loading LLE in stance, however note hip drop and lateral trunk lean to clear LLE during swing phase of gait.  This did improve slightly when PT provided facilitation at rib cage to maintain upright posture, however it did continue slightly. Pt reports she feels LLE is longer, therefore during seated rest break, added small heel wedge to RLE and had pt ambulate x 15' as above with marked improvement noted in postural control.  Will maintain heel wedge and let primary PT know to  continue to assess.  Pt assisted back to room, transferred to bed from w/c at min A.  Returned to supine at S level.  Pt left with all needs in reach.   Therapy Documentation Precautions:  Precautions Precautions: Fall Precaution Comments: left hemi Restrictions Weight Bearing Restrictions: No Pain: Pt with no reported pain during session.     Therapy/Group: Individual Therapy  Denice Bors 03/24/2019, 3:18 PM

## 2019-03-24 NOTE — Progress Notes (Signed)
Occupational Therapy Session Note  Patient Details  Name: Julia Banks MRN: [id number] Date of Birth: 1940-10-29  Today's Date: 03/24/2019 OT Individual Time: [id number] OT Individual Time Calculation (min): 71 min    Short Term Goals: Week 2:  OT Short Term Goal 1 (Week 2): Pt will perform toileting with mod A overall. OT Short Term Goal 2 (Week 2): Pt will perform LB dressing with mod A overall. OT Short Term Goal 3 (Week 2): Pt will perform bathing at shower level with min A overall.  Skilled Therapeutic Interventions/Progress Updates:    Upon entering the room, pt supine in bed with no c/o pain and agreeable to OT intervention. Pt transferred from bed >wheelchair with mod A stand pivot transfer. OT assisted pt via wheelchair into bathroom and onto toilet in same manner. Pt needing min A for standing balance while managing LB clothing. Pt removed other clothing items while seated on commode for safety awareness and time management. Pt transferred from toilet>wheelchair> TTB with mod A stand pivot transfer. Pt bathing from seated position on TTB with supervision for safety. Pt with use of bath mitt on L UE with hand over hand assistance needed. OT also introduced LH sponge to increase thoroughness and increase independence. Pt returning to wheelchair to don clothing items with sit <>stand at sink and with use of hemiplegic technique. OT discussed setting up family education for the week. Pt texting caregivers with adaptive equipment holding cellular device. Pt remained in wheelchair with call bell and all needed items within reach.   Therapy Documentation Precautions:  Precautions Precautions: Fall Precaution Comments: left hemi Restrictions Weight Bearing Restrictions: No General:   Vital Signs: Therapy Vitals BP: (!) 165/61 Pain: Pain Assessment Pain Scale: 0-10 Pain Score: 0-No pain Pain Location: Back Pain Descriptors / Indicators: Aching;Sore Pain Onset:  On-going Patients Stated Pain Goal: 0 Pain Intervention(s): Medication (See eMAR)   Therapy/Group: Individual Therapy  Gypsy Decant 03/24/2019, 8:44 AM

## 2019-03-25 ENCOUNTER — Inpatient Hospital Stay (HOSPITAL_COMMUNITY): Payer: Medicare Other | Admitting: Occupational Therapy

## 2019-03-25 ENCOUNTER — Inpatient Hospital Stay (HOSPITAL_COMMUNITY): Payer: Medicare Other | Admitting: Physical Therapy

## 2019-03-25 LAB — GLUCOSE, CAPILLARY
Glucose-Capillary: 84 mg/dL (ref 70–99)
Glucose-Capillary: 106 mg/dL — ABNORMAL HIGH (ref 70–99)
Glucose-Capillary: 161 mg/dL — ABNORMAL HIGH (ref 70–99)
Glucose-Capillary: 273 mg/dL — ABNORMAL HIGH (ref 70–99)

## 2019-03-25 MED ORDER — AMLODIPINE BESYLATE 5 MG PO TABS
7.5000 mg | ORAL_TABLET | Freq: Every day | ORAL | Status: DC
  Administered 2019-03-26 – 2019-03-29 (×4): 7.5 mg via ORAL
  Filled 2019-03-25 (×4): qty 1

## 2019-03-25 MED ORDER — AMLODIPINE BESYLATE 2.5 MG PO TABS
2.5000 mg | ORAL_TABLET | Freq: Once | ORAL | Status: AC
  Administered 2019-03-25: 2.5 mg via ORAL
  Filled 2019-03-25: qty 1

## 2019-03-25 NOTE — Progress Notes (Signed)
Social Work Patient ID: Julia Banks, female   DOB: 1941/01/09, 78 y.o.   MRN: [id number] Spoke with Michelle-niece and pt aware tomorrow at 1:00-3:00 pm for family training. Will continue to work toward discharge 10/2.

## 2019-03-25 NOTE — Progress Notes (Signed)
Occupational Therapy Weekly Progress Note  Patient Details  Name: Julia Banks MRN: [id number] Date of Birth: 12/23/1940  Beginning of progress report period: 03/17/2019 End of progress report period: 03/25/2019  Patient has met 3 of 3 short term goals.    Pt continues to make excellent progress at time of report. She now completes stand pivot bathroom transfers with Mod A and can complete ADLs at [place] A level overall using hemi techniques. Anticipate she will still need Min A for standing balance at time of d/c so goals have been adjusted to reflect this. Her niece, [name], has been present during most OT sessions, involved in OT POC, receptive to education, and will be attending a hands-on family education session this weekend. Will continue d/c planning for safe transition home on 10/2. Continue POC.   Patient continues to demonstrate the following deficits: muscle weakness and muscle paralysis, decreased cardiorespiratoy endurance, abnormal tone, unbalanced muscle activation, ataxia and decreased coordination and decreased sitting balance, decreased standing balance, decreased postural control and hemiplegia and therefore will continue to benefit from skilled OT intervention to enhance overall performance with BADL.  Patient progressing toward long term goals with a few POC revisions.    OT Short Term Goals Week 2:  OT Short Term Goal 1 (Week 2): Pt will perform toileting with mod A overall. OT Short Term Goal 1 - Progress (Week 2): Met OT Short Term Goal 2 (Week 2): Pt will perform LB dressing with mod A overall. OT Short Term Goal 2 - Progress (Week 2): Met OT Short Term Goal 3 (Week 2): Pt will perform bathing at shower level with min A overall. OT Short Term Goal 3 - Progress (Week 2): Met Week 3:  OT Short Term Goal 1 (Week 3): STGs=LTGs due to ELOS     Therapy Documentation Precautions:  Precautions Precautions: Fall Precaution Comments: left hemi Restrictions Weight  Bearing Restrictions: No     Therapy/Group: Individual Therapy  Barnes Florek A Nevin Grizzle 03/25/2019, 12:28 PM

## 2019-03-25 NOTE — Plan of Care (Signed)
  Problem: Consults Goal: RH STROKE PATIENT EDUCATION Description: See Patient Education module for education specifics  Outcome: Progressing Goal: Nutrition Consult-if indicated Outcome: Progressing Goal: Diabetes Guidelines if Diabetic/Glucose > 140 Description: If diabetic or lab glucose is > 140 mg/dl - Initiate Diabetes/Hyperglycemia Guidelines & Document Interventions  Outcome: Progressing   Problem: RH BOWEL ELIMINATION Goal: RH STG MANAGE BOWEL WITH ASSISTANCE Description: STG Manage Bowel with min Assistance. Outcome: Progressing Goal: RH STG MANAGE BOWEL W/MEDICATION W/ASSISTANCE Description: STG Manage Bowel with Medication with mod I Assistance. Outcome: Progressing   Problem: RH BLADDER ELIMINATION Goal: RH STG MANAGE BLADDER WITH ASSISTANCE Description: STG Manage Bladder With  min Assistance Outcome: Progressing   Problem: RH SKIN INTEGRITY Goal: RH STG SKIN FREE OF INFECTION/BREAKDOWN Description: With min assist Outcome: Progressing Goal: RH STG MAINTAIN SKIN INTEGRITY WITH ASSISTANCE Description: STG Maintain Skin Integrity With min Assistance. Outcome: Progressing   Problem: RH SAFETY Goal: RH STG ADHERE TO SAFETY PRECAUTIONS W/ASSISTANCE/DEVICE Description: STG Adhere to Safety Precautions With cues/reminders Assistance/Device. Outcome: Progressing   Problem: RH PAIN MANAGEMENT Goal: RH STG PAIN MANAGED AT OR BELOW PT'S PAIN GOAL Description: At or below level 5 Outcome: Progressing   Problem: RH KNOWLEDGE DEFICIT Goal: RH STG INCREASE KNOWLEDGE OF DIABETES Description: Pt will be able to manage DM using handouts and educational information with cues/reminders from family Outcome: Progressing Goal: RH STG INCREASE KNOWLEDGE OF HYPERTENSION Description: Pt will be able to manage HTN using handouts and educational information with cues/reminders from family Outcome: Progressing Goal: RH STG INCREASE KNOWLEGDE OF HYPERLIPIDEMIA Description: Pt will be  able to manage HLD using handouts and educational information with cues/reminders from family Outcome: Progressing Goal: RH STG INCREASE KNOWLEDGE OF STROKE PROPHYLAXIS Description: Pt will be able to manage secondary stroke prevention using handouts and educational information with cues/reminders from family Outcome: Progressing   Problem: RH Vision Goal: RH LTG Vision (Specify) Outcome: Progressing   Problem: RH Pre-functional/Other (Specify) Goal: RH LTG Pre-functional (Specify) Outcome: Progressing Goal: RH LTG Interdisciplinary (Specify) 1 Description: RH LTG Interdisciplinary (Specify)1 Outcome: Progressing Goal: RH LTG Interdisciplinary (Specify) 2 Description: RH LTG Interdisciplinary (Specify) 2  Outcome: Progressing

## 2019-03-25 NOTE — Progress Notes (Signed)
Physical Therapy Session Note  Patient Details  Name: Julia Banks MRN: [id number] Date of Birth: 02-02-1941  Today's Date: 03/25/2019 PT Individual Time: 1500-1600 PT Individual Time Calculation (min): 60 min   Short Term Goals: Week 1:  PT Short Term Goal 1 (Week 1): Pt will perform min assist bed mobility PT Short Term Goal 1 - Progress (Week 1): Met PT Short Term Goal 2 (Week 1): PT will perform wc<>bed with mod assist. PT Short Term Goal 2 - Progress (Week 1): Met PT Short Term Goal 3 (Week 1): Pt will propel WC 176f with supervision assist PT Short Term Goal 3 - Progress (Week 1): Met PT Short Term Goal 4 (Week 1): Pt will ambulate 317fwith max assist of 1 and LRAD PT Short Term Goal 4 - Progress (Week 1): Met Week 2:  PT Short Term Goal 1 (Week 2): STG=LTG due to ELOS   Skilled Therapeutic Interventions/Progress Updates:  Pt received upright in wheelchair upon arrival for therapy. Pt agreeable to PT session. Pt propelled WC from her room>therapy gym about 150 feet with supervision. Pt ambulated [id number] with minA and RW and L trial AFO. Pt required verbal cueing for step width and step length and tactile cueing at the elbow/triceps for triceps activation while walking to decrease trunk lean. Then pt completed modified situps (red bolster/wedge and pillow placed behind pt) seated at the edge of the mat table. Pt completed forward modified situps 2x8 and rotational situps 2x8 to isolate external oblique on the right. Pt required verbal and tactile cueing for task and form. Then, pt ambulated 150 feet and 30 feetx2 with minA and RW requiring verbal cueing for step width and slowing of gait speed. Then, pt transported back to room, transferred back to bed with minA and RW. Pt positoned in bed with pillows, bed alarm set, call bell in reach, and all needs met at this time.     Therapy Documentation Precautions:  Precautions Precautions: Fall Precaution Comments: left  hemi Restrictions Weight Bearing Restrictions: No  Pain: Denies pain   Therapy/Group: Individual Therapy  KrOlena LeatherwoodSPT  03/25/2019, 6:06 PM

## 2019-03-25 NOTE — Plan of Care (Signed)
  Problem: RH Simple Meal Prep Goal: LTG Patient will perform simple meal prep w/assist (OT) Description: LTG: Patient will perform simple meal prep with assistance, with/without cues (OT). Outcome: Adequate for Discharge Note: Pts family will assist with IADL needs. Goal d/c    Problem: RH Laundry Goal: LTG Patient will perform laundry w/assist, cues (OT) Description: LTG: Patient will perform laundry with assistance, with/without cues (OT). Outcome: Adequate for Discharge Note: Pts family will assist with IADL needs. Goal d/c    Problem: RH Balance Goal: LTG Patient will maintain dynamic standing with ADLs (OT) Description: LTG:  Patient will maintain dynamic standing balance with assist during activities of daily living (OT)  Flowsheets (Taken 03/25/2019 1522) LTG: Pt will maintain dynamic standing balance during ADLs with: Minimal Assistance - Patient > 75% Note: Downgraded due to slow progress   Problem: Sit to Stand Goal: LTG:  Patient will perform sit to stand in prep for activites of daily living with assistance level (OT) Description: LTG:  Patient will perform sit to stand in prep for activites of daily living with assistance level (OT) Flowsheets (Taken 03/25/2019 1522) LTG: PT will perform sit to stand in prep for activites of daily living with assistance level: Minimal Assistance - Patient > 75% Note: Downgraded due to slow progress   Problem: RH Dressing Goal: LTG Patient will perform lower body dressing w/assist (OT) Description: LTG: Patient will perform lower body dressing with assist, with/without cues in positioning using equipment (OT) Flowsheets (Taken 03/25/2019 1522) LTG: Pt will perform lower body dressing with assistance level of: Minimal Assistance - Patient > 75% Note: Downgraded due to slow progress   Problem: RH Toileting Goal: LTG Patient will perform toileting task (3/3 steps) with assistance level (OT) Description: LTG: Patient will perform toileting  task (3/3 steps) with assistance level (OT)  Flowsheets (Taken 03/25/2019 1522) LTG: Pt will perform toileting task (3/3 steps) with assistance level: Minimal Assistance - Patient > 75% Note: Downgraded due to slow progress   Problem: RH Toilet Transfers Goal: LTG Patient will perform toilet transfers w/assist (OT) Description: LTG: Patient will perform toilet transfers with assist, with/without cues using equipment (OT) Flowsheets (Taken 03/25/2019 1522) LTG: Pt will perform toilet transfers with assistance level of: Minimal Assistance - Patient > 75% Note: Downgraded due to slow progress   Problem: RH Tub/Shower Transfers Goal: LTG Patient will perform tub/shower transfers w/assist (OT) Description: LTG: Patient will perform tub/shower transfers with assist, with/without cues using equipment (OT) Flowsheets (Taken 03/25/2019 1522) LTG: Pt will perform tub/shower stall transfers with assistance level of: Minimal Assistance - Patient > 75% Note: Downgraded due to slow progress

## 2019-03-25 NOTE — Progress Notes (Signed)
Recreational Therapy Session Note  Patient Details  Name: Julia Banks MRN: [id number] Date of Birth: 07-Mar-1941 Today's Date: 03/25/2019 Time:  830-855  Pain: no c/o Skilled Therapeutic Interventions/Progress Updates: Session focused on relaxation training including diaphragmatic breathing, handout provided.  Verbal instuction/demonstration given.  Pt appreciative.  Also provided a handout on activity analysis for use at discharge.    Therapy/Group: Individual Therapy   Aleph Nickson 03/25/2019, 9:12 AM

## 2019-03-25 NOTE — Progress Notes (Signed)
Occupational Therapy Session Note  Patient Details  Name: [name] MRN: [id number] Date of Birth: [birth year]  Today's Date: 03/25/2019 OT Individual Time: [id number] Individual Time Calculation (min): 56 min   Short Term Goals: Week 2:  OT Short Term Goal 1 (Week 2): Pt will perform toileting with mod A overall. OT Short Term Goal 2 (Week 2): Pt will perform LB dressing with mod A overall. OT Short Term Goal 3 (Week 2): Pt will perform bathing at shower level with min A overall.    Skilled Therapeutic Interventions/Progress Updates:    Pt greeted in bed with RT present. She was premedicated for Lt sided pain, requesting to shower. Supine<sit completed with supervision and Mod A for stand pivot<w/c<TTB. Noted L LE ataxia during transfers. She then bathed while seated, utilizing lateral leans for hygiene with instruction. LH sponge for washing R UE using one handed technique. Pt able to obtain figure 4 position functionally during bathing as well as dressing tasks sit<stand at the sink. Independent recall of energy conservation and hemiplegic techniques when dressing. Min vcs for elevating Lt elbow onto sink during oral care for weightbearing and also for one-handed techniques when applying deoderant. Mod A for standing balance when elevating LB garments over hips and Mod A for full elevation on the Lt side. The same balance and clothing assist required for toileting tasks afterwards. Close supervision for sitting balance during perihygiene. At end of session pt returned to bed via transfer method stated above. She was repositioned for comfort and left with all needs within reach. 4 bedrails up per request. Tx focus placed on ADL retraining, Lt NMR, standing balance, and functional transfers.    Therapy Documentation Precautions:  Precautions Precautions: Fall Precaution Comments: left hemi Restrictions Weight Bearing Restrictions: No ADL:        Therapy/Group: Individual  Therapy  Kaidyn Hernandes A Tanee Henery 03/25/2019, 12:18 PM

## 2019-03-25 NOTE — Progress Notes (Signed)
[place] PHYSICAL MEDICINE & REHABILITATION PROGRESS NOTE   Subjective/Complaints: Patient seen sitting up in bed this morning.  She states she slept well overnight.  She notes her cough is nearly resolved.  Her abdomen is slightly firm, however she notes she is having BMs.  ROS: Denies CP, SOB, N/V/D  Objective:   No results found. No results for input(s): WBC, HGB, HCT, PLT in the last 72 hours. Recent Labs    03/23/19 0705 03/24/19 0555  NA  --  138  K  --  4.1  CL  --  105  CO2  --  23  GLUCOSE  --  146*  BUN  --  19  CREATININE 1.31* 1.31*  CALCIUM  --  9.0    Intake/Output Summary (Last 24 hours) at 03/25/2019 0852 Last data filed at 03/25/2019 0749 Gross per 24 hour  Intake 1110 ml  Output -  Net 1110 ml     Physical Exam: Vital Signs Blood pressure (!) 163/57, pulse 69, temperature 98.2 F (36.8 C), resp. rate 18, height 5\' 6"  (1.676 m), weight 86 kg, SpO2 96 %. Constitutional: No distress . Vital signs reviewed. HENT: Normocephalic.  Atraumatic. Eyes: EOMI. No discharge. Cardiovascular: No JVD. Respiratory: Normal effort.  No stridor. GI: Non-distended. Skin: Warm and dry.  Intact. Psych: Normal mood.  Normal behavior. Musc: No edema in extremities.  No tenderness in extremities. Neurologic: Alert Dysarthria Motor: Left upper extremity: Shoulder abduction, elbow flexion/extension 2/5, handgrip 1+-2-/5 with apraxia Left lower extremity: 4-/5 hip flexion, knee extension, 1/5 ankle dorsiflexion, unchanged  Assessment/Plan: 1. Functional deficits secondary to Left hemiparesis after Right subcortical infarct which require 3+ hours per day of interdisciplinary therapy in a comprehensive inpatient rehab setting.  Physiatrist is providing close team supervision and 24 hour management of active medical problems listed below.  Physiatrist and rehab team continue to assess barriers to discharge/monitor patient progress toward functional and medical  goals  Care Tool:  Bathing    Body parts bathed by patient: Right arm, Left arm, Chest, Abdomen, Front perineal area, Right upper leg, Left upper leg, Right lower leg, Left lower leg, Face   Body parts bathed by helper: Buttocks     Bathing assist Assist Level: Minimal Assistance - Patient > 75%     Upper Body Dressing/Undressing Upper body dressing   What is the patient wearing?: Pull over shirt    Upper body assist Assist Level: Set up assist    Lower Body Dressing/Undressing Lower body dressing      What is the patient wearing?: Underwear/pull up, Pants     Lower body assist Assist for lower body dressing: Moderate Assistance - Patient 50 - 74%     Toileting Toileting    Toileting assist Assist for toileting: Minimal Assistance - Patient > 75%     Transfers Chair/bed transfer  Transfers assist     Chair/bed transfer assist level: Minimal Assistance - Patient > 75%     Locomotion Ambulation   Ambulation assist      Assist level: Minimal Assistance - Patient > 75% Assistive device: Walker-rolling Max distance: 100   Walk 10 feet activity   Assist     Assist level: Minimal Assistance - Patient > 75% Assistive device: Walker-rolling(HO, AFO)   Walk 50 feet activity   Assist Walk 50 feet with 2 turns activity did not occur: Safety/medical concerns  Assist level: Minimal Assistance - Patient > 75% Assistive device: Walker-rolling    Walk 150 feet activity  Assist Walk 150 feet activity did not occur: Safety/medical concerns  Assist level: Dependent - Patient 0% Assistive device: Lite Gait    Walk 10 feet on uneven surface  activity   Assist Walk 10 feet on uneven surfaces activity did not occur: Safety/medical concerns         Wheelchair     Assist   Type of Wheelchair: Manual    Wheelchair assist level: Supervision/Verbal cueing Max wheelchair distance: 150    Wheelchair 50 feet with 2 turns  activity    Assist        Assist Level: Supervision/Verbal cueing   Wheelchair 150 feet activity     Assist  Wheelchair 150 feet activity did not occur: Safety/medical concerns   Assist Level: Supervision/Verbal cueing   Blood pressure (!) 163/57, pulse 69, temperature 98.2 F (36.8 C), resp. rate 18, height 5\' 6"  (1.676 m), weight 86 kg, SpO2 96 %.  Medical Problem List and Plan: 1.  Left-sided weakness secondary to acute perforator infarct at the right basal ganglia corona radiata onset 03/07/2019.  Recommendations for 30-day cardiac event monitor  Continue CIR  PRAFO nightly  WHO QHS 2.  Antithrombotics: -DVT/anticoagulation: Subcutaneous Lovenox             -antiplatelet therapy: Aspirin 81 mg daily, Plavix 75 mg daily x3 weeks (~9/28) then aspirin alone 3. Pain Management: Tylenol as needed 4. Mood: Provide emotional support             -antipsychotic agents: N/A 5. Neuropsych: This patient is capable of making decisions on her own behalf. 6. Skin/Wound Care: Routine skin checks 7. Fluids/Electrolytes/Nutrition: Routine in and outs 8.  Hypertension.    Patient on Lasix 20 mg daily, Cozaar 50 mg daily, Toprol-XL 50 mg daily prior to admission.   Vitals:   03/25/19 0619 03/25/19 0811  BP: (!) 163/57   Pulse: 69   Resp: 18   Temp: 98.2 F (36.8 C)   SpO2: 97% 96%   Cozaar 25 started on 9/17, increased to 50 on 9/19, increased to 100 on 9/21  Norvasc 5 started on 9/23, increased to 7.5 on 9/25  Elevated on 9/25 9.  Diabetes mellitus with hyperglycemia.  Hemoglobin A1c 7.2. Check blood sugars before meals and at bedtime CBG (last 3)  Recent Labs    03/24/19 1718 03/24/19 2213 03/25/19 0615  GLUCAP 162* 162* 84   Lantus  47 units   Added glimepiride 1mg  on 9/17, increased to 2 mg on 9/21, DC'd on 9/24 due to hypoglycemia  Labile on 9/25, glimepiride recently [place], monitor for trend 10.  Hypothyroidism.  Continue supplement 11.  CKD stage III.  Baseline  creatinine 1.2-1.35.   Creatinine 1.31 on 9/24  Continue to monitor 12.  Hyperlipidemia.  Patient intolerant with pravastatin and other statins.  Follow-up outpatient with PCP 13.  Constipation:   Stool softener started on 9/19 14.  Acute blood loss anemia  Hemoglobin 11.9 on 9/21  Continue to monitor 15. HS Cough:  Added prn robitussin   Improving   LOS: 16 days A FACE TO FACE EVALUATION WAS PERFORMED  Shae Augello Lorie Phenix 03/25/2019, 8:52 AM

## 2019-03-25 NOTE — Progress Notes (Signed)
Occupational Therapy Session Note  Patient Details  Name: Julia Banks MRN: [id number] Date of Birth: Nov 23, 1940  Today's Date: 03/25/2019 OT Individual Time: 1115-1200 & 1300-1330 OT Individual Time Calculation (min): 45 min & 30 min   Short Term Goals: Week 2:  OT Short Term Goal 1 (Week 2): Pt will perform toileting with mod A overall. OT Short Term Goal 1 - Progress (Week 2): Met OT Short Term Goal 2 (Week 2): Pt will perform LB dressing with mod A overall. OT Short Term Goal 2 - Progress (Week 2): Met OT Short Term Goal 3 (Week 2): Pt will perform bathing at shower level with min A overall. OT Short Term Goal 3 - Progress (Week 2): Met  Skilled Therapeutic Interventions/Progress Updates:    session 1:  Patient in bed, alert, ready for therapy session.  Supine to SSP CS, SPT to/from bed, w/c, toilet and mat table  CGA, min tactile cues for weight shift/position in space.  Able to complete toileting with min a for pants up over left hip.  Unsupported sitting and standing UB motor activities and weight shift with weight bearing through left UE with good tolerance and no LOB for 4 standing trials (3 + minutes each)  - increased control of left UE for placement and closed chain exercises - limited wrist and hand motor control continues.  She remained in the w/c at close of session with call bell and tray table in reach.  Session 2:  Seated in w/c and ready for therapy session - completed standing reach and place task in kitchen environment with CGA, min cues for balance - used right UE for reach and place and left UE as support on counter top.  Completed left UE motor control exercises in stance x2 with improving proximal control.  Returned to bed at close of session with CGA for transfer and CS for SSP to supine.  Bed alarm set and call bell in reach.    Therapy Documentation Precautions:  Precautions Precautions: Fall Precaution Comments: left hemi Restrictions Weight Bearing  Restrictions: No General:   Vital Signs:  Pain: Pain Assessment Pain Scale: 0-10 Pain Score: 0-No pain   Therapy/Group: Individual Therapy  Carlos Levering 03/25/2019, 2:19 PM

## 2019-03-26 ENCOUNTER — Encounter (HOSPITAL_COMMUNITY): Payer: Medicare Other | Admitting: Occupational Therapy

## 2019-03-26 ENCOUNTER — Ambulatory Visit (HOSPITAL_COMMUNITY): Payer: Medicare Other | Admitting: Physical Therapy

## 2019-03-26 LAB — GLUCOSE, CAPILLARY
Glucose-Capillary: 165 mg/dL — ABNORMAL HIGH (ref 70–99)
Glucose-Capillary: 108 mg/dL — ABNORMAL HIGH (ref 70–99)
Glucose-Capillary: 260 mg/dL — ABNORMAL HIGH (ref 70–99)
Glucose-Capillary: 178 mg/dL — ABNORMAL HIGH (ref 70–99)

## 2019-03-26 MED ORDER — INSULIN GLARGINE 100 UNIT/ML ~~LOC~~ SOLN
40.0000 [IU] | Freq: Every day | SUBCUTANEOUS | Status: DC
  Administered 2019-03-26 – 2019-03-27 (×2): 40 [IU] via SUBCUTANEOUS
  Filled 2019-03-26 (×3): qty 0.4

## 2019-03-26 MED ORDER — INSULIN GLARGINE 100 UNIT/ML ~~LOC~~ SOLN
10.0000 [IU] | Freq: Every day | SUBCUTANEOUS | Status: DC
  Administered 2019-03-26 – 2019-03-29 (×4): 10 [IU] via SUBCUTANEOUS
  Filled 2019-03-26 (×4): qty 0.1

## 2019-03-26 NOTE — Progress Notes (Signed)
Physical Therapy Session Note  Patient Details  Name: Julia Banks MRN: [id number] Date of Birth: [birth year]  Today's Date: 03/26/2019 PT Individual Time: 1300-1400 PT Individual Time Calculation (min): 60 min   Short Term Goals: Week 2:  PT Short Term Goal 1 (Week 2): STG=LTG due to ELOS  Skilled Therapeutic Interventions/Progress Updates:   Pt received sitting in WC and agreeable to PT. Pt's niece present for family education. Pt instructed in gait training 2 x 32f with CGA-min assist provided by PT and niece with min cues for AD management, gait pattern and safety in turns. Stair negotiation 4 x 4steps with anterior ascent and posterior descent. Car transfer x 2 with min assist from PT and Niece with moderate cues from PT to pt and family for proper AD management as well as gait pattern and safe guarding technique. Bed mobility to 25inch bed height with supervision assist and min cues for posture and attention to the LUE.  [facility] to [facility] and all needs met.         Therapy Documentation Precautions:  Precautions Precautions: Fall Precaution Comments: left hemi Restrictions Weight Bearing Restrictions: No Vital Signs: Therapy Vitals Temp: 98.6 F (37 C) Pulse Rate: 71 Resp: 16 BP: (!) 150/76 Patient Position (if appropriate): Sitting Oxygen Therapy SpO2: 98 % O2 Device: Room Air Pain: denies   Therapy/Group: Individual Therapy  ALorie Phenix9/26/2020, 3:23 PM

## 2019-03-26 NOTE — Progress Notes (Signed)
[place] PHYSICAL MEDICINE & REHABILITATION PROGRESS NOTE   Subjective/Complaints: Up in bed. No new complaints. Making progress. Looking forward to going home later this coming week  ROS: Patient denies fever, rash, sore throat, blurred vision, nausea, vomiting, diarrhea, cough, shortness of breath or chest pain, joint or back pain, headache, or mood change.    Objective:   No results found. No results for input(s): WBC, HGB, HCT, PLT in the last 72 hours. Recent Labs    03/24/19 0555  NA 138  K 4.1  CL 105  CO2 23  GLUCOSE 146*  BUN 19  CREATININE 1.31*  CALCIUM 9.0    Intake/Output Summary (Last 24 hours) at 03/26/2019 0926 Last data filed at 03/25/2019 1310 Gross per 24 hour  Intake 300 ml  Output -  Net 300 ml     Physical Exam: Vital Signs Blood pressure (!) 150/48, pulse 65, temperature 98.2 F (36.8 C), resp. rate 18, height 5\' 6"  (1.676 m), weight 86 kg, SpO2 96 %. Constitutional: No distress . Vital signs reviewed. HEENT: EOMI, oral membranes moist Neck: supple Cardiovascular: RRR without murmur. No JVD    Respiratory: CTA Bilaterally without wheezes or rales. Normal effort    GI: BS +, non-tender, non-distended  Skin: Warm and dry.  Intact. Psych: Normal mood.  Normal behavior. Musc: No edema in extremities.  No tenderness in extremities. Neurologic: Alert Dysarthria Motor: Left upper extremity: Shoulder abduction, elbow flexion/extension 2/5, handgrip remains 1+/5 with apraxia Left lower extremity: 4-/5 hip flexion, knee extension, tr to 1/5 ankle dorsiflexion,stable  Assessment/Plan: 1. Functional deficits secondary to Left hemiparesis after Right subcortical infarct which require 3+ hours per day of interdisciplinary therapy in a comprehensive inpatient rehab setting.  Physiatrist is providing close team supervision and 24 hour management of active medical problems listed below.  Physiatrist and rehab team continue to assess barriers to  discharge/monitor patient progress toward functional and medical goals  Care Tool:  Bathing    Body parts bathed by patient: Right arm, Left arm, Chest, Abdomen, Front perineal area, Right upper leg, Left upper leg, Right lower leg, Left lower leg, Face, Buttocks   Body parts bathed by helper: Buttocks     Bathing assist Assist Level: Supervision/Verbal cueing     Upper Body Dressing/Undressing Upper body dressing   What is the patient wearing?: Pull over shirt    Upper body assist Assist Level: Set up assist    Lower Body Dressing/Undressing Lower body dressing      What is the patient wearing?: Underwear/pull up, Pants     Lower body assist Assist for lower body dressing: Moderate Assistance - Patient 50 - 74%     Toileting Toileting    Toileting assist Assist for toileting: Minimal Assistance - Patient > 75%     Transfers Chair/bed transfer  Transfers assist     Chair/bed transfer assist level: Minimal Assistance - Patient > 75%     Locomotion Ambulation   Ambulation assist      Assist level: Minimal Assistance - Patient > 75% Assistive device: Walker-rolling Max distance: 100   Walk 10 feet activity   Assist     Assist level: Minimal Assistance - Patient > 75% Assistive device: Walker-rolling(HO, AFO)   Walk 50 feet activity   Assist Walk 50 feet with 2 turns activity did not occur: Safety/medical concerns  Assist level: Minimal Assistance - Patient > 75% Assistive device: Walker-rolling    Walk 150 feet activity   Assist Walk 150  feet activity did not occur: Safety/medical concerns  Assist level: Dependent - Patient 0% Assistive device: Lite Gait    Walk 10 feet on uneven surface  activity   Assist Walk 10 feet on uneven surfaces activity did not occur: Safety/medical concerns         Wheelchair     Assist   Type of Wheelchair: Manual    Wheelchair assist level: Supervision/Verbal cueing Max wheelchair  distance: 150    Wheelchair 50 feet with 2 turns activity    Assist        Assist Level: Supervision/Verbal cueing   Wheelchair 150 feet activity     Assist  Wheelchair 150 feet activity did not occur: Safety/medical concerns   Assist Level: Supervision/Verbal cueing   Blood pressure (!) 150/48, pulse 65, temperature 98.2 F (36.8 C), resp. rate 18, height 5\' 6"  (1.676 m), weight 86 kg, SpO2 96 %.  Medical Problem List and Plan: 1.  Left-sided weakness secondary to acute perforator infarct at the right basal ganglia corona radiata onset 03/07/2019.  Recommendations for 30-day cardiac event monitor  Continue CIR  PRAFO nightly  WHO QHS 2.  Antithrombotics: -DVT/anticoagulation: Subcutaneous Lovenox             -antiplatelet therapy: Aspirin 81 mg daily, Plavix 75 mg daily x3 weeks (~9/28) then aspirin alone 3. Pain Management: Tylenol as needed 4. Mood: Provide emotional support             -antipsychotic agents: N/A 5. Neuropsych: This patient is capable of making decisions on her own behalf. 6. Skin/Wound Care: Routine skin checks 7. Fluids/Electrolytes/Nutrition: Routine in and outs 8.  Hypertension.    Patient on Lasix 20 mg daily, Cozaar 50 mg daily, Toprol-XL 50 mg daily prior to admission.   Vitals:   03/26/19 0759 03/26/19 0805  BP:  (!) 150/48  Pulse:    Resp:    Temp:    SpO2: 96%    Cozaar 25 started on 9/17, increased to 50 on 9/19, increased to 100 on 9/21  Norvasc 5 started on 9/23, increased to 7.5 on 9/25  Borderline controlled 9/26 9.  Diabetes mellitus with hyperglycemia.  Hemoglobin A1c 7.2. Check blood sugars before meals and at bedtime CBG (last 3)  Recent Labs    03/25/19 1641 03/25/19 2105 03/26/19 0615  GLUCAP 161* 273* 165*   Lantus  47 units qhs  Added glimepiride 1mg  on 9/17, increased to 2 mg on 9/21, DC'd on 9/24 due to hypoglycemia  Labile on 9/26, glimepiride recently DC'd   -sugars higher in PM  -add AM lantus, decrease Pm  lantus--->10u/40u 10.  Hypothyroidism.  Continue supplement 11.  CKD stage III.  Baseline creatinine 1.2-1.35.   Creatinine 1.31 on 9/24  Continue to monitor 12.  Hyperlipidemia.  Patient intolerant with pravastatin and other statins.  Follow-up outpatient with PCP 13.  Constipation:   Stool softener started on 9/19 14.  Acute blood loss anemia  Hemoglobin 11.9 on 9/21  Continue to monitor 15. HS Cough:  Added prn robitussin   Improving   LOS: 17 days A FACE TO FACE EVALUATION WAS PERFORMED  Meredith Staggers 03/26/2019, 9:26 AM

## 2019-03-26 NOTE — Progress Notes (Signed)
Occupational Therapy Session Note  Patient Details  Name: Julia Banks MRN: [id number] Date of Birth: 10/10/1940  Today's Date: 03/26/2019 OT Individual Time: 1400-1500 OT Individual Time Calculation (min): 60 min   Short Term Goals: Week 3:  OT Short Term Goal 1 (Week 3): STGs=LTGs due to ELOS  Skilled Therapeutic Interventions/Progress Updates:    Pt greeted in w/c with niece [name] present for family education. Started with reviewing pictures of the bathroom in her home. Pt has both a walk in and garden tub that she can use. The toilet is accessible in a closet-like space. Took pt down to the tub shower room where she completed an ambulatory TTB transfer using RW with Min A + Lt AFO and Lt hand orthotic. After providing demonstration, her niece practiced providing this assist during the transfer. Discussed environmental modifications to implement to enhance safety and also position of shower curtain (they'd need to purchase one). Pt wanted to attempt the walk in shower transfer as she prefers this shower at home. She had some difficultly with safely clearing the threshold with L LE, requiring Mod A for balance while standing on one foot before sitting down on the shower chair. When caregiver practiced this transfer, she was standing inside of the simulated "shower" space. Discussed OT's recommendation of using the garden tub vs walk in shower to increase their safety. They were both agreeable to use the garden tub at d/c. Niece plans to add a hand held shower hose and put up a curtain. Also practiced side-stepping with RW through a doorway and sitting on a chair to simulate toilet transfers at home. Niece able to demonstrate carryover of education when assisting with this transfer as well. Still advised using a 3:1 for nighttime toileting. Also reinforced that pt needs to wear her Lt AFO during all functional ambulation/transfers, even in/out of shower. They verbalized understanding. Pt was  then taken back to the room and transferred to bed with Min A stand pivot towards Rt side. She was left with niece for repositioning in bed. Both had no other d/c questions for OT.   Therapy Documentation Precautions:  Precautions Precautions: Fall Precaution Comments: left hemi Restrictions Weight Bearing Restrictions: No Vital Signs: Therapy Vitals Temp: 98.6 F (37 C) Pulse Rate: 71 Resp: 16 BP: (!) 150/76 Patient Position (if appropriate): Sitting Oxygen Therapy SpO2: 98 % O2 Device: Room Air Pain: No c/o pain during tx   ADL:       Therapy/Group: Individual Therapy  Beverly Suriano A Cable Fearn 03/26/2019, 4:34 PM

## 2019-03-26 NOTE — Plan of Care (Signed)
PT long term goals Downgraded due to continued coordination and proprioception deficits.  See care plan.   Barrie Folk PT, DPT

## 2019-03-27 LAB — GLUCOSE, CAPILLARY
Glucose-Capillary: 98 mg/dL (ref 70–99)
Glucose-Capillary: 106 mg/dL — ABNORMAL HIGH (ref 70–99)
Glucose-Capillary: 262 mg/dL — ABNORMAL HIGH (ref 70–99)
Glucose-Capillary: 273 mg/dL — ABNORMAL HIGH (ref 70–99)
Glucose-Capillary: 336 mg/dL — ABNORMAL HIGH (ref 70–99)

## 2019-03-27 MED ORDER — BISACODYL 10 MG RE SUPP: 10.0000 mg | Freq: Every day | RECTAL | Status: DC | PRN

## 2019-03-27 MED ORDER — SORBITOL 70 % SOLN: 30.0000 mL | Freq: Every day | Status: DC | PRN

## 2019-03-27 MED ORDER — SENNOSIDES-DOCUSATE SODIUM 8.6-50 MG PO TABS
1.0000 | ORAL_TABLET | Freq: Every day | ORAL | Status: DC
  Administered 2019-03-27 – 2019-03-31 (×5): 1 via ORAL
  Filled 2019-03-27 (×5): qty 1

## 2019-03-27 NOTE — Progress Notes (Signed)
[place] PHYSICAL MEDICINE & REHABILITATION PROGRESS NOTE   Subjective/Complaints: No new problems today.   ROS: Patient denies fever, rash, sore throat, blurred vision, nausea, vomiting, diarrhea, cough, shortness of breath or chest pain, joint or back pain, headache, or mood change.   Objective:   No results found. No results for input(s): WBC, HGB, HCT, PLT in the last 72 hours. No results for input(s): NA, K, CL, CO2, GLUCOSE, BUN, CREATININE, CALCIUM in the last 72 hours.  Intake/Output Summary (Last 24 hours) at 03/27/2019 0940 Last data filed at 03/26/2019 1842 Gross per 24 hour  Intake 461 ml  Output -  Net 461 ml     Physical Exam: Vital Signs Blood pressure (!) 168/56, pulse 68, temperature 97.6 F (36.4 C), temperature source Oral, resp. rate 18, height 5\' 6"  (1.676 m), weight 86 kg, SpO2 99 %. Constitutional: No distress . Vital signs reviewed. HEENT: EOMI, oral membranes moist Neck: supple Cardiovascular: RRR without murmur. No JVD    Respiratory: CTA Bilaterally without wheezes or rales. Normal effort    GI: BS +, non-tender, non-distended  Skin: Warm and dry.  Intact. Psych: Normal mood.  Normal behavior. Musc: No edema in extremities.  No tenderness in extremities. Neurologic: Alert Dysarthria Motor: Left upper extremity: Shoulder abduction, elbow flexion/extension 2/5, handgrip remains 1+/5 with apraxia Left lower extremity: 4-/5 hip flexion, knee extension, tr to 1/5 ankle dorsiflexion,stable  Assessment/Plan: 1. Functional deficits secondary to Left hemiparesis after Right subcortical infarct which require 3+ hours per day of interdisciplinary therapy in a comprehensive inpatient rehab setting.  Physiatrist is providing close team supervision and 24 hour management of active medical problems listed below.  Physiatrist and rehab team continue to assess barriers to discharge/monitor patient progress toward functional and medical goals  Care  Tool:  Bathing    Body parts bathed by patient: Right arm, Left arm, Chest, Abdomen, Front perineal area, Right upper leg, Left upper leg, Right lower leg, Left lower leg, Face, Buttocks   Body parts bathed by helper: Buttocks     Bathing assist Assist Level: Supervision/Verbal cueing     Upper Body Dressing/Undressing Upper body dressing   What is the patient wearing?: Pull over shirt    Upper body assist Assist Level: Set up assist    Lower Body Dressing/Undressing Lower body dressing      What is the patient wearing?: Underwear/pull up, Pants     Lower body assist Assist for lower body dressing: Moderate Assistance - Patient 50 - 74%     Toileting Toileting    Toileting assist Assist for toileting: Minimal Assistance - Patient > 75%     Transfers Chair/bed transfer  Transfers assist     Chair/bed transfer assist level: Minimal Assistance - Patient > 75%     Locomotion Ambulation   Ambulation assist      Assist level: Minimal Assistance - Patient > 75% Assistive device: Walker-rolling Max distance: 100   Walk 10 feet activity   Assist     Assist level: Minimal Assistance - Patient > 75% Assistive device: Walker-rolling(HO, AFO)   Walk 50 feet activity   Assist Walk 50 feet with 2 turns activity did not occur: Safety/medical concerns  Assist level: Minimal Assistance - Patient > 75% Assistive device: Walker-rolling    Walk 150 feet activity   Assist Walk 150 feet activity did not occur: Safety/medical concerns  Assist level: Dependent - Patient 0% Assistive device: Lite Gait    Walk 10 feet on uneven  surface  activity   Assist Walk 10 feet on uneven surfaces activity did not occur: Safety/medical concerns         Wheelchair     Assist   Type of Wheelchair: Manual    Wheelchair assist level: Supervision/Verbal cueing Max wheelchair distance: 150    Wheelchair 50 feet with 2 turns activity    Assist         Assist Level: Supervision/Verbal cueing   Wheelchair 150 feet activity     Assist  Wheelchair 150 feet activity did not occur: Safety/medical concerns   Assist Level: Supervision/Verbal cueing   Blood pressure (!) 168/56, pulse 68, temperature 97.6 F (36.4 C), temperature source Oral, resp. rate 18, height 5\' 6"  (1.676 m), weight 86 kg, SpO2 99 %.  Medical Problem List and Plan: 1.  Left-sided weakness secondary to acute perforator infarct at the right basal ganglia corona radiata onset 03/07/2019.  Recommendations for 30-day cardiac event monitor  Continue CIR  PRAFO nightly  WHO QHS 2.  Antithrombotics: -DVT/anticoagulation: Subcutaneous Lovenox             -antiplatelet therapy: Aspirin 81 mg daily, Plavix 75 mg daily x3 weeks (~9/28) then aspirin alone 3. Pain Management: Tylenol as needed 4. Mood: Provide emotional support             -antipsychotic agents: N/A 5. Neuropsych: This patient is capable of making decisions on her own behalf. 6. Skin/Wound Care: Routine skin checks 7. Fluids/Electrolytes/Nutrition: Routine in and outs 8.  Hypertension.    Patient on Lasix 20 mg daily, Cozaar 50 mg daily, Toprol-XL 50 mg daily prior to admission.   Vitals:   03/27/19 0441 03/27/19 0813  BP: (!) 168/56   Pulse: 68   Resp: 18 18  Temp: 97.6 F (36.4 C)   SpO2: 99%    Cozaar 25 started on 9/17, increased to 50 on 9/19, increased to 100 on 9/21  Norvasc 5 started on 9/23, increased to 7.5 on 9/25  Borderline controlled 9/27---observe today 9.  Diabetes mellitus with hyperglycemia.  Hemoglobin A1c 7.2. Check blood sugars before meals and at bedtime CBG (last 3)  Recent Labs    03/26/19 1715 03/26/19 2133 03/27/19 0605  GLUCAP 260* 178* 98   Lantus  47 units qhs  Added glimepiride 1mg  on 9/17, increased to 2 mg on 9/21, DC'd on 9/24 due to hypoglycemia  Labile on 9/26, glimepiride recently DC'd   -sugars higher in PM  - AM lantus adjusted to 10u and PM lantus to 40u  9/26   -observe for pattern 10.  Hypothyroidism.  Continue supplement 11.  CKD stage III.  Baseline creatinine 1.2-1.35.   Creatinine 1.31 on 9/24  Continue to monitor 12.  Hyperlipidemia.  Patient intolerant with pravastatin and other statins.  Follow-up outpatient with PCP 13.  Constipation:   Stool softener started on 9/19 14.  Acute blood loss anemia  Hemoglobin 11.9 on 9/21  Continue to monitor 15. HS Cough:  Added prn robitussin   Improving   LOS: 18 days A FACE TO FACE EVALUATION WAS PERFORMED  Meredith Staggers 03/27/2019, 9:40 AM

## 2019-03-28 ENCOUNTER — Inpatient Hospital Stay (HOSPITAL_COMMUNITY): Payer: Medicare Other | Admitting: Occupational Therapy

## 2019-03-28 ENCOUNTER — Inpatient Hospital Stay (HOSPITAL_COMMUNITY): Payer: Medicare Other | Admitting: Physical Therapy

## 2019-03-28 LAB — GLUCOSE, CAPILLARY
Glucose-Capillary: 73 mg/dL (ref 70–99)
Glucose-Capillary: 120 mg/dL — ABNORMAL HIGH (ref 70–99)
Glucose-Capillary: 190 mg/dL — ABNORMAL HIGH (ref 70–99)
Glucose-Capillary: 143 mg/dL — ABNORMAL HIGH (ref 70–99)

## 2019-03-28 MED ORDER — INSULIN GLARGINE 100 UNIT/ML ~~LOC~~ SOLN
37.0000 [IU] | Freq: Every day | SUBCUTANEOUS | Status: DC
  Administered 2019-03-28: 37 [IU] via SUBCUTANEOUS
  Filled 2019-03-28 (×2): qty 0.37

## 2019-03-28 NOTE — Progress Notes (Signed)
Physical Therapy Session Note  Patient Details  Name: Julia Banks MRN: [id number] Date of Birth: Sep 27, 1940  Today's Date: 03/28/2019 PT Individual Time: 1310-1420 PT Individual Time Calculation (min): 70 min   Short Term Goals: Week 2:  PT Short Term Goal 1 (Week 2): STG=LTG due to ELOS  Skilled Therapeutic Interventions/Progress Updates: Pt presented in bed completing lunch and agreeable to therapy. Pt requesting to use bathroom. Performed bed mobility with supervision and use of bed features. Performed squat pivot transfer to w/c CGA and pt transported to toilet and performed squat pivot to toilet in same manner with PTA providing minA for clothing management (+void). Pt returned to w/c in same manner once completed and PTA donned shoes/AFO total A for time management. Pt transported to rehab gym for energy conservation and participated in toe taps to target and alternating toe taps to wt shifting and forced use of LLE. Pt also participated in game of horseshoes on level tile with RW and with RLE on 2in step for L bias.Pt ambulated x 10 ft to parallel bars and participated in L NMR/forced use activities. Performed hip abd/add, hip flexion, hip extension 2 x 10. Pt was unable to complete HS pulls against gravity with shoe/AFO on.  After theraputic rest pt ambulated 61f with RW and minA near CGA with pt demonstrating improved step length, weight shifting, and foot placement. Noted increased L knee instability and decreased foot clearance with fatigue. Pt transported remaining distance to room and performed stand pivot with RW to bed CGA. Pt returned to supine supervision with use of bed features and positioned self to comfort. Pt left in bed with alarm on, call bell within reach and needs met.      Therapy Documentation Precautions:  Precautions Precautions: Fall Precaution Comments: left hemi Restrictions Weight Bearing Restrictions: No General:   Vital Signs: Therapy Vitals Temp:  97.9 F (36.6 C) Pulse Rate: 67 Resp: 17 BP: (!) 149/55 Patient Position (if appropriate): Sitting Oxygen Therapy SpO2: 98 % O2 Device: Room Air    Therapy/Group: Individual Therapy  Brenn Deziel  Cleatis Fandrich, PTA  03/28/2019, 3:48 PM

## 2019-03-28 NOTE — Progress Notes (Signed)
Orthopedic Tech Progress Note Patient Details:  [name] 1940-09-13 [id number]  Patient ID: Julia Banks, female   DOB: 1940/12/07, 78 y.o.   MRN: [id number]   [place] Pink 03/28/2019, 1:48 PMCalled [employer] for left AFO brace.

## 2019-03-28 NOTE — Progress Notes (Signed)
Occupational Therapy Session Note  Patient Details  Name: Julia Banks MRN: [id number] Date of Birth: October 04, 1940  Today's Date: 03/28/2019 OT Individual Time: 0705-0800 OT Individual Time Calculation (min): 55 min    Short Term Goals: Week 3:  OT Short Term Goal 1 (Week 3): STGs=LTGs due to ELOS  Skilled Therapeutic Interventions/Progress Updates:    Upon entering the room, pt supine in bed with no c/o pain but reports, " I don't feel 100% yet." Pt with low blood sugar event early this morning but has ate breakfast since that time. Bed mobility performed with supervision to EOB. OT set up wheelchair and stand pivot transfer with min A. Pt seated in wheelchair and assisted into bathroom. Min A stand pivot transfer from wheelchair > TTB with use of grab bar. Pt seated on TTB for bathing at supervision level with use of LH sponge to increase I with self care tasks. Pt returning to wheelchair and dressing tasks at sink with mod A for LB self care and set up A for UB self care utilizing hemiplegic dressing technique. Pt reports fatigue and requests to return to bed with min A overall. Call bell and all needed items within reach. Bed alarm activated.   Therapy Documentation Precautions:  Precautions Precautions: Fall Precaution Comments: left hemi Restrictions Weight Bearing Restrictions: No  Pain: Pain Assessment Pain Scale: 0-10 Pain Score: 0-No pain Pain Descriptors / Indicators: Headache Patients Stated Pain Goal: 0 Pain Intervention(s): Medication (See eMAR)    Therapy/Group: Individual Therapy  Gypsy Decant 03/28/2019, 8:10 AM

## 2019-03-28 NOTE — Plan of Care (Signed)
  Problem: Consults Goal: RH STROKE PATIENT EDUCATION Description: See Patient Education module for education specifics  Outcome: Progressing Goal: Nutrition Consult-if indicated Outcome: Progressing Goal: Diabetes Guidelines if Diabetic/Glucose > 140 Description: If diabetic or lab glucose is > 140 mg/dl - Initiate Diabetes/Hyperglycemia Guidelines & Document Interventions  Outcome: Progressing   Problem: RH BOWEL ELIMINATION Goal: RH STG MANAGE BOWEL WITH ASSISTANCE Description: STG Manage Bowel with min Assistance. Outcome: Progressing Goal: RH STG MANAGE BOWEL W/MEDICATION W/ASSISTANCE Description: STG Manage Bowel with Medication with mod I Assistance. Outcome: Progressing   Problem: RH BLADDER ELIMINATION Goal: RH STG MANAGE BLADDER WITH ASSISTANCE Description: STG Manage Bladder With  min Assistance Outcome: Progressing   Problem: RH SKIN INTEGRITY Goal: RH STG SKIN FREE OF INFECTION/BREAKDOWN Description: With min assist Outcome: Progressing Goal: RH STG MAINTAIN SKIN INTEGRITY WITH ASSISTANCE Description: STG Maintain Skin Integrity With min Assistance. Outcome: Progressing   Problem: RH SAFETY Goal: RH STG ADHERE TO SAFETY PRECAUTIONS W/ASSISTANCE/DEVICE Description: STG Adhere to Safety Precautions With cues/reminders Assistance/Device. Outcome: Progressing   Problem: RH PAIN MANAGEMENT Goal: RH STG PAIN MANAGED AT OR BELOW PT'S PAIN GOAL Description: At or below level 5 Outcome: Progressing   Problem: RH KNOWLEDGE DEFICIT Goal: RH STG INCREASE KNOWLEDGE OF DIABETES Description: Pt will be able to manage DM using handouts and educational information with cues/reminders from family Outcome: Progressing Goal: RH STG INCREASE KNOWLEDGE OF HYPERTENSION Description: Pt will be able to manage HTN using handouts and educational information with cues/reminders from family Outcome: Progressing Goal: RH STG INCREASE KNOWLEGDE OF HYPERLIPIDEMIA Description: Pt will be  able to manage HLD using handouts and educational information with cues/reminders from family Outcome: Progressing Goal: RH STG INCREASE KNOWLEDGE OF STROKE PROPHYLAXIS Description: Pt will be able to manage secondary stroke prevention using handouts and educational information with cues/reminders from family Outcome: Progressing   Problem: RH Vision Goal: RH LTG Vision (Specify) Outcome: Progressing   Problem: RH Pre-functional/Other (Specify) Goal: RH LTG Pre-functional (Specify) Outcome: Progressing Goal: RH LTG Interdisciplinary (Specify) 1 Description: RH LTG Interdisciplinary (Specify)1 Outcome: Progressing Goal: RH LTG Interdisciplinary (Specify) 2 Description: RH LTG Interdisciplinary (Specify) 2  Outcome: Progressing

## 2019-03-28 NOTE — Progress Notes (Signed)
[place] PHYSICAL MEDICINE & REHABILITATION PROGRESS NOTE   Subjective/Complaints: Back of head pain , no pain with neck movement .   ROS: Patient denies  nausea, vomiting, diarrhea, cough, shortness of breath or chest pain,  Objective:   No results found. No results for input(s): WBC, HGB, HCT, PLT in the last 72 hours. No results for input(s): NA, K, CL, CO2, GLUCOSE, BUN, CREATININE, CALCIUM in the last 72 hours.  Intake/Output Summary (Last 24 hours) at 03/28/2019 1049 Last data filed at 03/28/2019 0753 Gross per 24 hour  Intake 480 ml  Output -  Net 480 ml     Physical Exam: Vital Signs Blood pressure (!) 163/52, pulse 64, temperature 97.6 F (36.4 C), temperature source Oral, resp. rate 18, height 5\' 6"  (1.676 m), weight 86 kg, SpO2 97 %. Constitutional: No distress . Vital signs reviewed. HEENT: EOMI, oral membranes moist Neck: supple, no pain to palpation, no pain with ROM Cardiovascular: RRR without murmur. No JVD    Respiratory: CTA Bilaterally without wheezes or rales. Normal effort    GI: BS +, non-tender, non-distended  Skin: Warm and dry.  Intact. Psych: Normal mood.  Normal behavior. Musc: No edema in extremities.  No tenderness in extremities. Neurologic: Alert Dysarthria Motor: Left upper extremity: Shoulder abduction, elbow flexion/extension 2/5, handgrip remains 1+/5 with apraxia Left lower extremity: 4-/5 hip flexion, knee extension, tr to 1/5 ankle dorsiflexion,stable  Assessment/Plan: 1. Functional deficits secondary to Left hemiparesis after Right subcortical infarct which require 3+ hours per day of interdisciplinary therapy in a comprehensive inpatient rehab setting.  Physiatrist is providing close team supervision and 24 hour management of active medical problems listed below.  Physiatrist and rehab team continue to assess barriers to discharge/monitor patient progress toward functional and medical goals  Care Tool:  Bathing    Body parts  bathed by patient: Right arm, Left arm, Chest, Abdomen, Front perineal area, Right upper leg, Left upper leg, Right lower leg, Left lower leg, Face, Buttocks   Body parts bathed by helper: Buttocks     Bathing assist Assist Level: Supervision/Verbal cueing     Upper Body Dressing/Undressing Upper body dressing   What is the patient wearing?: Pull over shirt    Upper body assist Assist Level: Set up assist    Lower Body Dressing/Undressing Lower body dressing      What is the patient wearing?: Underwear/pull up, Pants     Lower body assist Assist for lower body dressing: Moderate Assistance - Patient 50 - 74%     Toileting Toileting    Toileting assist Assist for toileting: Minimal Assistance - Patient > 75%     Transfers Chair/bed transfer  Transfers assist     Chair/bed transfer assist level: Minimal Assistance - Patient > 75%     Locomotion Ambulation   Ambulation assist      Assist level: Minimal Assistance - Patient > 75% Assistive device: Walker-rolling Max distance: 100   Walk 10 feet activity   Assist     Assist level: Minimal Assistance - Patient > 75% Assistive device: Walker-rolling(HO, AFO)   Walk 50 feet activity   Assist Walk 50 feet with 2 turns activity did not occur: Safety/medical concerns  Assist level: Minimal Assistance - Patient > 75% Assistive device: Walker-rolling    Walk 150 feet activity   Assist Walk 150 feet activity did not occur: Safety/medical concerns  Assist level: Dependent - Patient 0% Assistive device: Lite Gait    Walk 10 feet on  uneven surface  activity   Assist Walk 10 feet on uneven surfaces activity did not occur: Safety/medical concerns         Wheelchair     Assist   Type of Wheelchair: Manual    Wheelchair assist level: Supervision/Verbal cueing Max wheelchair distance: 150    Wheelchair 50 feet with 2 turns activity    Assist        Assist Level:  Supervision/Verbal cueing   Wheelchair 150 feet activity     Assist  Wheelchair 150 feet activity did not occur: Safety/medical concerns   Assist Level: Supervision/Verbal cueing   Blood pressure (!) 163/52, pulse 64, temperature 97.6 F (36.4 C), temperature source Oral, resp. rate 18, height 5\' 6"  (1.676 m), weight 86 kg, SpO2 97 %.  Medical Problem List and Plan: 1.  Left-sided weakness secondary to acute perforator infarct at the right basal ganglia corona radiata onset 03/07/2019.  Recommendations for 30-day cardiac event monitor  Continue CIR  PRAFO nightly  WHO QHS 2.  Antithrombotics: -DVT/anticoagulation: Subcutaneous Lovenox             -antiplatelet therapy: Aspirin 81 mg daily, Plavix 75 mg daily x3 weeks (~9/28) then aspirin alone 3. Pain Management: Tylenol as needed 4. Mood: Provide emotional support             -antipsychotic agents: N/A 5. Neuropsych: This patient is capable of making decisions on her own behalf. 6. Skin/Wound Care: Routine skin checks 7. Fluids/Electrolytes/Nutrition: Routine in and outs 8.  Hypertension.    Patient on Lasix 20 mg daily, Cozaar 50 mg daily, Toprol-XL 50 mg daily prior to admission.   Vitals:   03/27/19 1959 03/28/19 0345  BP: (!) 182/59 (!) 163/52  Pulse: 66 64  Resp: 16 18  Temp: 98.4 F (36.9 C) 97.6 F (36.4 C)  SpO2: 99% 97%   Cozaar 25 started on 9/17, increased to 50 on 9/19, increased to 100 on 9/21  Norvasc 5 started on 9/23, increased to 7.5 on 9/25 Elevated increase amlodipine to 10mg  on Tues 9.  Diabetes mellitus with hyperglycemia.  Hemoglobin A1c 7.2. Check blood sugars before meals and at bedtime CBG (last 3)  Recent Labs    03/27/19 1638 03/27/19 2118 03/28/19 0614  GLUCAP 273* 336* 73   Lantus  47 units qhs  Added glimepiride 1mg  on 9/17, increased to 2 mg on 9/21, DC'd on 9/24 due to hypoglycemia  Labile on 9/26, glimepiride recently DC'd   -sugars higher in PM  - AM lantus adjusted to 10u and PM  lantus to 40u 9/26- reduce to 37U   -observe for pattern 10.  Hypothyroidism.  Continue supplement 11.  CKD stage III.  Baseline creatinine 1.2-1.35.   Creatinine 1.31 on 9/24  Continue to monitor 12.  Hyperlipidemia.  Patient intolerant with pravastatin and other statins.  Follow-up outpatient with PCP 13.  Constipation:   Stool softener started on 9/19 14.  Acute blood loss anemia  Hemoglobin 11.9 on 9/21  Continue to monitor 15. HS Cough:  Added prn robitussin   Improving   LOS: 19 days A FACE TO FACE EVALUATION WAS PERFORMED  Charlett Blake 03/28/2019, 10:49 AM

## 2019-03-28 NOTE — Progress Notes (Signed)
Occupational Therapy Session Note  Patient Details  Name: Julia Banks MRN: [id number] Date of Birth: 04-Feb-1941  Today's Date: 03/28/2019 OT Individual Time: 1720-9106 OT Individual Time Calculation (min): 59 min   Short Term Goals: Week 3:  OT Short Term Goal 1 (Week 3): STGs=LTGs due to ELOS  Skilled Therapeutic Interventions/Progress Updates:    Pt greeted in the bed, min c/o Lt hand pain however she does not want anything medicinally for it. ADL needs met. Supine<sit completed with supervision and Mod A for stand pivot<w/c towards Lt side. Printed pt a home exercise packet for her Lt arm. Reviewed benefits and precautions of self ROM, joint protection, and exercise techniques. Guided her through the packet with vcs with pt exhibiting good carryover of education. Discussed importance of daily ROM at home for continued NMR of L UE. At end of session pt wanted to return to the bed. Min A stand pivot<bed towards Rt using bedrail. Supervision for transition to supine. Pt was then assisted with repositioning for comfort, left her with all needs within reach and 4 bedrails up per request.    Therapy Documentation Precautions:  Precautions Precautions: Fall Precaution Comments: left hemi Restrictions Weight Bearing Restrictions: No Pain: Pain Assessment Pain Scale: 0-10 Pain Score: 0-No pain Faces Pain Scale: No hurt Pain Type: Acute pain Pain Location: Head Pain Orientation: Posterior Pain Descriptors / Indicators: Headache Pain Frequency: Occasional Pain Onset: Gradual Patients Stated Pain Goal: 0 Pain Intervention(s): Medication (See eMAR) ADL:       Therapy/Group: Individual Therapy  Temika Sutphin A Nannette Zill 03/28/2019, 12:29 PM

## 2019-03-29 ENCOUNTER — Inpatient Hospital Stay (HOSPITAL_COMMUNITY): Payer: Medicare Other | Admitting: Physical Therapy

## 2019-03-29 ENCOUNTER — Inpatient Hospital Stay (HOSPITAL_COMMUNITY): Payer: Medicare Other | Admitting: Occupational Therapy

## 2019-03-29 LAB — GLUCOSE, CAPILLARY
Glucose-Capillary: 57 mg/dL — ABNORMAL LOW (ref 70–99)
Glucose-Capillary: 95 mg/dL (ref 70–99)
Glucose-Capillary: 139 mg/dL — ABNORMAL HIGH (ref 70–99)
Glucose-Capillary: 160 mg/dL — ABNORMAL HIGH (ref 70–99)
Glucose-Capillary: 179 mg/dL — ABNORMAL HIGH (ref 70–99)

## 2019-03-29 MED ORDER — INSULIN GLARGINE 100 UNIT/ML ~~LOC~~ SOLN
10.0000 [IU] | Freq: Every day | SUBCUTANEOUS | Status: DC
  Administered 2019-03-30 – 2019-04-01 (×3): 10 [IU] via SUBCUTANEOUS
  Filled 2019-03-29 (×3): qty 0.1

## 2019-03-29 MED ORDER — INSULIN GLARGINE 100 UNIT/ML ~~LOC~~ SOLN
32.0000 [IU] | Freq: Every day | SUBCUTANEOUS | Status: DC
  Administered 2019-03-29 – 2019-03-31 (×3): 32 [IU] via SUBCUTANEOUS
  Filled 2019-03-29 (×4): qty 0.32

## 2019-03-29 MED ORDER — AMLODIPINE BESYLATE 10 MG PO TABS
10.0000 mg | ORAL_TABLET | Freq: Every day | ORAL | Status: DC
  Administered 2019-03-30 – 2019-04-01 (×3): 10 mg via ORAL
  Filled 2019-03-29 (×3): qty 1

## 2019-03-29 MED ORDER — INSULIN GLARGINE 100 UNIT/ML ~~LOC~~ SOLN: 5.0000 [IU] | Freq: Every day | SUBCUTANEOUS | Status: DC

## 2019-03-29 NOTE — Progress Notes (Signed)
Occupational Therapy Session Note  Patient Details  Name: Julia Banks MRN: [id number] Date of Birth: 1940-09-19  Today's Date: 03/29/2019 OT Individual Time: 1400-1500 OT Individual Time Calculation (min): 60 min    Short Term Goals: Week 1:  OT Short Term Goal 1 (Week 1): Pt will perform shower transfer with mod A overall. OT Short Term Goal 1 - Progress (Week 1): Met OT Short Term Goal 2 (Week 1): Pt will engage in toilet with mod A overall. OT Short Term Goal 2 - Progress (Week 1): Not met OT Short Term Goal 3 (Week 1): Pt will demonstrate hemiplegic dressing technique with min A for UB. OT Short Term Goal 3 - Progress (Week 1): Met  Skilled Therapeutic Interventions/Progress Updates:    1:1 NMR with focus on normal patterns of movement in left UE. Pt still presents with abnormal shoulder compensation movements with functional reach. Performed functional tasks with focus on normal postural control without hiking shoulder. Performed pickup and transporting small soft blocks with focus on grasp and release. Pt continues to have difficulty with finger and wrist extension; but with improved grasp including thumb adduction. Performed zoom ball with focus on postural control and left LE movement- able to do 7 passes before looses grip. Also addressed trunk rotation with focus on rotation towards the right. Also given tan theraputty focus on finger and wrist extension.  Returned to room and transferred into bed with supervision.   Therapy Documentation Precautions:  Precautions Precautions: Fall Precaution Comments: left hemi Restrictions Weight Bearing Restrictions: No Pain:  no c/o pain  Therapy/Group: Individual Therapy  Willeen Cass [facility] 03/29/2019, 3:22 PM

## 2019-03-29 NOTE — Progress Notes (Signed)
[name] PHYSICAL MEDICINE & REHABILITATION PROGRESS NOTE   Subjective/Complaints: Discussed low CBG  ROS: Patient denies  nausea, vomiting, diarrhea, cough, shortness of breath or chest pain,  Objective:   No results found. No results for input(s): WBC, HGB, HCT, PLT in the last 72 hours. No results for input(s): NA, K, CL, CO2, GLUCOSE, BUN, CREATININE, CALCIUM in the last 72 hours.  Intake/Output Summary (Last 24 hours) at 03/29/2019 0917 Last data filed at 03/28/2019 1931 Gross per 24 hour  Intake 360 ml  Output -  Net 360 ml     Physical Exam: Vital Signs Blood pressure (!) 180/51, pulse 62, temperature 98.1 F (36.7 C), temperature source Oral, resp. rate 18, height 5\' 6"  (1.676 m), weight 86 kg, SpO2 97 %. Constitutional: No distress . Vital signs reviewed. HEENT: EOMI, oral membranes moist Neck: supple, no pain to palpation, no pain with ROM Cardiovascular: RRR without murmur. No JVD    Respiratory: CTA Bilaterally without wheezes or rales. Normal effort    GI: BS +, non-tender, non-distended  Skin: Warm and dry.  Intact. Psych: Normal mood.  Normal behavior. Musc: No edema in extremities.  No tenderness in extremities. Neurologic: Alert Dysarthria Motor: Left upper extremity: Shoulder abduction, elbow flexion/extension 2/5, handgrip remains 1+/5 with apraxia Left lower extremity: 4-/5 hip flexion, knee extension, tr to 1/5 ankle dorsiflexion,stable  Assessment/Plan: 1. Functional deficits secondary to Left hemiparesis after Right subcortical infarct which require 3+ hours per day of interdisciplinary therapy in a comprehensive inpatient rehab setting.  Physiatrist is providing close team supervision and 24 hour management of active medical problems listed below.  Physiatrist and rehab team continue to assess barriers to discharge/monitor patient progress toward functional and medical goals  Care Tool:  Bathing    Body parts bathed by patient: Right arm,  Left arm, Chest, Abdomen, Front perineal area, Right upper leg, Left upper leg, Right lower leg, Left lower leg, Face, Buttocks   Body parts bathed by helper: Buttocks     Bathing assist Assist Level: Supervision/Verbal cueing     Upper Body Dressing/Undressing Upper body dressing   What is the patient wearing?: Pull over shirt    Upper body assist Assist Level: Set up assist    Lower Body Dressing/Undressing Lower body dressing      What is the patient wearing?: Underwear/pull up, Pants     Lower body assist Assist for lower body dressing: Moderate Assistance - Patient 50 - 74%     Toileting Toileting    Toileting assist Assist for toileting: Minimal Assistance - Patient > 75%     Transfers Chair/bed transfer  Transfers assist     Chair/bed transfer assist level: Minimal Assistance - Patient > 75%     Locomotion Ambulation   Ambulation assist      Assist level: Minimal Assistance - Patient > 75% Assistive device: Walker-rolling Max distance: 100   Walk 10 feet activity   Assist     Assist level: Minimal Assistance - Patient > 75% Assistive device: Walker-rolling(HO, AFO)   Walk 50 feet activity   Assist Walk 50 feet with 2 turns activity did not occur: Safety/medical concerns  Assist level: Minimal Assistance - Patient > 75% Assistive device: Walker-rolling    Walk 150 feet activity   Assist Walk 150 feet activity did not occur: Safety/medical concerns  Assist level: Dependent - Patient 0% Assistive device: Lite Gait    Walk 10 feet on uneven surface  activity   Assist Walk 10  feet on uneven surfaces activity did not occur: Safety/medical concerns         Wheelchair     Assist   Type of Wheelchair: Manual    Wheelchair assist level: Supervision/Verbal cueing Max wheelchair distance: 150    Wheelchair 50 feet with 2 turns activity    Assist        Assist Level: Supervision/Verbal cueing   Wheelchair 150  feet activity     Assist  Wheelchair 150 feet activity did not occur: Safety/medical concerns   Assist Level: Supervision/Verbal cueing   Blood pressure (!) 180/51, pulse 62, temperature 98.1 F (36.7 C), temperature source Oral, resp. rate 18, height 5\' 6"  (1.676 m), weight 86 kg, SpO2 97 %.  Medical Problem List and Plan: 1.  Left-sided weakness secondary to acute perforator infarct at the right basal ganglia corona radiata onset 03/07/2019.  Recommendations for 30-day cardiac event monitor  Continue CIR  PRAFO nightly  WHO QHS 2.  Antithrombotics: -DVT/anticoagulation: Subcutaneous Lovenox             -antiplatelet therapy: Aspirin 81 mg daily, Plavix 75 mg daily x3 weeks (~9/28) then aspirin alone 3. Pain Management: Tylenol as needed 4. Mood: Provide emotional support             -antipsychotic agents: N/A 5. Neuropsych: This patient is capable of making decisions on her own behalf. 6. Skin/Wound Care: Routine skin checks 7. Fluids/Electrolytes/Nutrition: Routine in and outs 8.  Hypertension.    Patient on Lasix 20 mg daily, Cozaar 50 mg daily, Toprol-XL 50 mg daily prior to admission.   Vitals:   03/29/19 0417 03/29/19 0839  BP: (!) 180/51   Pulse: 61 62  Resp: 18 18  Temp: 98.1 F (36.7 C)   SpO2: 98% 97%   Cozaar 25 started on 9/17, increased to 50 on 9/19, increased to 100 on 9/21  Norvasc 5 started on 9/23, increased to 7.5 on 9/25 Elevated increase amlodipine to 10mg  on 9/29 9.  Diabetes mellitus with hyperglycemia.  Hemoglobin A1c 7.2. Check blood sugars before meals and at bedtime CBG (last 3)  Recent Labs    03/28/19 2124 03/29/19 0619 03/29/19 0643  GLUCAP 143* 57* 95   Lantus  47 units qhs  Added glimepiride 1mg  on 9/17, increased to 2 mg on 9/21, DC'd on 9/24 due to hypoglycemia    -sugars higher in PM  - AM lantus adjusted to 10u and PM lantus- reduce to 32U   -observe for pattern 10.  Hypothyroidism.  Continue supplement 11.  CKD stage III.   Baseline creatinine 1.2-1.35.   Creatinine 1.31 on 9/24  Continue to monitor 12.  Hyperlipidemia.  Patient intolerant with pravastatin and other statins.  Follow-up outpatient with PCP 13.  Constipation:   Stool softener started on 9/19 14.  Acute blood loss anemia  Hemoglobin 11.9 on 9/21  Continue to monitor 15. HS Cough:  Added prn robitussin   Improving   LOS: 20 days A FACE TO FACE EVALUATION WAS PERFORMED  Charlett Blake 03/29/2019, 9:17 AM

## 2019-03-29 NOTE — Progress Notes (Signed)
Occupational Therapy Session Note  Patient Details  Name: Julia Banks MRN: [id number] Date of Birth: Oct 22, 1940  Today's Date: 03/29/2019 OT Individual Time: 0800-0908 OT Individual Time Calculation (min): 68 min    Short Term Goals: Week 3:  OT Short Term Goal 1 (Week 3): STGs=LTGs due to ELOS  Skilled Therapeutic Interventions/Progress Updates:    Upon entering the room, pt supine in bed with RN present giving medications. Pt had another hypoglycemic event this morning and reports feeling "funny and not right". Pt has ate breakfast and agreeable to attempt OT intervention. Min A stand pivot transfer into wheelchair and pt requesting self care tasks at sink. Pt needing increased time this session secondary to fatigue/event. Pt washing and donning UB with supervision overall. Pt LB self care with figure four position to increase independence and one handed technique utilized to don B non slip socks this session. BP taken with results of 161/59 while seated in wheelchair. Pt requesting to return to bed secondary to not feeling well. Min A stand pivot transfer and supervision for sit >supine. Bed alarm activated and call bell within reach upon exiting the room.   Therapy Documentation Precautions:  Precautions Precautions: Fall Precaution Comments: left hemi Restrictions Weight Bearing Restrictions: No General:   Vital Signs: Therapy Vitals Pulse Rate: 61 Resp: 18 BP: (!) 179/60 Patient Position (if appropriate): Sitting Oxygen Therapy SpO2: 97 % O2 Device: Room Air Pain: Pain Assessment Pain Score: 4    Therapy/Group: Individual Therapy  Gypsy Decant 03/29/2019, 10:28 AM

## 2019-03-29 NOTE — Progress Notes (Signed)
Physical Therapy Session Note  Patient Details  Name: Julia Banks MRN: [id number] Date of Birth: August 22, 1940  Today's Date: 03/29/2019 PT Individual Time: 1010-1105 PT Individual Time Calculation (min): 55 min   Short Term Goals: Week 2:  PT Short Term Goal 1 (Week 2): STG=LTG due to ELOS  Skilled Therapeutic Interventions/Progress Updates: Pt presented in bed sleeping but easily aroused and agreeable to therapy. Per pt feeling a little better from earlier. BP assessed prior to mobility 184/55 Pulse 61, nsg notified. Adv to continue therapy but continue to monitor. Performed bed mobility supervision with use of features and PTA donned shoes/AFO fot time management. Performed stand pivot transfer to w/c minA (BP 178/59) and PTA transferred to day room. Performed stand pivot to NuStep minA and participated in NuStel L1 x65 min (monitoring symptoms) BP assessed after NuStep 163/65 HR 69 SpO2 96%. Pt stating feeling some lightheadedness after activity. Pt ambulated 26f with RW with initial CGA then requiring minA for turning into w/c due to requiring cues for L foot placement. Pt transported remaining distance to room and performed stand pivot to bed with RW CGA. PTA doffed shoes/AFO and pt returned to supine supervision with use of bed rail. Pt indicated niece has obtained bed rail (that fits between mattress/boxspring). Pt repositioned to comfort and left with bed alarm on, call bell within reach and needs met.      Therapy Documentation Precautions:  Precautions Precautions: Fall Precaution Comments: left hemi Restrictions Weight Bearing Restrictions: No General:   Vital Signs: Therapy Vitals Temp: 98.1 F (36.7 C) Pulse Rate: 66 Resp: (!) 22 BP: (!) 168/61 Patient Position (if appropriate): Sitting Oxygen Therapy SpO2: 97 % O2 Device: Room Air Pain:      Therapy/Group: Individual Therapy  Ruthmary Occhipinti  [place], PTA  03/29/2019, 4:22 PM

## 2019-03-29 NOTE — Progress Notes (Signed)
Hypoglycemic Event  CBG: 57  Treatment: graham crackers, peanut butter and apple juice   Symptoms: none  Follow-up CBG: [id number] CBG Result:95  Possible Reasons for Event: unknown  Comments/MD notified:no    Owens Shark, Hector Shade

## 2019-03-30 ENCOUNTER — Inpatient Hospital Stay (HOSPITAL_COMMUNITY): Payer: Medicare Other | Admitting: Occupational Therapy

## 2019-03-30 ENCOUNTER — Inpatient Hospital Stay (HOSPITAL_COMMUNITY): Payer: Medicare Other | Admitting: Physical Therapy

## 2019-03-30 ENCOUNTER — Inpatient Hospital Stay (HOSPITAL_COMMUNITY): Payer: Medicare Other | Admitting: *Deleted

## 2019-03-30 LAB — GLUCOSE, CAPILLARY
Glucose-Capillary: 108 mg/dL — ABNORMAL HIGH (ref 70–99)
Glucose-Capillary: 131 mg/dL — ABNORMAL HIGH (ref 70–99)
Glucose-Capillary: 162 mg/dL — ABNORMAL HIGH (ref 70–99)
Glucose-Capillary: 191 mg/dL — ABNORMAL HIGH (ref 70–99)

## 2019-03-30 LAB — CREATININE, SERUM
Creatinine, Ser: 1.31 mg/dL — ABNORMAL HIGH (ref 0.44–1.00)
GFR calc Af Amer: 46 mL/min — ABNORMAL LOW (ref >60)
GFR calc non Af Amer: 39 mL/min — ABNORMAL LOW (ref >60)

## 2019-03-30 NOTE — Progress Notes (Signed)
PHYSICAL MEDICINE & REHABILITATION PROGRESS NOTE   Subjective/Complaints:  No issues overnite   ROS: Patient denies  nausea, vomiting, diarrhea, cough, shortness of breath or chest pain,  Objective:   No results found. No results for input(s): WBC, HGB, HCT, PLT in the last 72 hours. Recent Labs    03/30/19 0514  CREATININE 1.31*    Intake/Output Summary (Last 24 hours) at 03/30/2019 0758 Last data filed at 03/29/2019 1816 Gross per 24 hour  Intake 240 ml  Output -  Net 240 ml     Physical Exam: Vital Signs Blood pressure (!) 160/61, pulse 68, temperature 98 F (36.7 C), temperature source Oral, resp. rate 18, height '5\' 6"'  (1.676 m), weight 86.2 kg, SpO2 99 %. Constitutional: No distress . Vital signs reviewed. HEENT: EOMI, oral membranes moist Neck: supple, no pain to palpation, no pain with ROM Cardiovascular: RRR without murmur. No JVD    Respiratory: CTA Bilaterally without wheezes or rales. Normal effort    GI: BS +, non-tender, non-distended  Skin: Warm and dry.  Intact. Psych: Normal mood.  Normal behavior. Musc: No edema in extremities.  No tenderness in extremities. Neurologic: Alert Dysarthria Motor: Left upper extremity: Shoulder abduction, elbow flexion/extension 2/5, handgrip remains 1+/5 with apraxia Left lower extremity: 4-/5 hip flexion, knee extension, tr to 1/5 ankle dorsiflexion,stable  Assessment/Plan: 1. Functional deficits secondary to Left hemiparesis after Right subcortical infarct which require 3+ hours per day of interdisciplinary therapy in a comprehensive inpatient rehab setting.  Physiatrist is providing close team supervision and 24 hour management of active medical problems listed below.  Physiatrist and rehab team continue to assess barriers to discharge/monitor patient progress toward functional and medical goals  Care Tool:  Bathing    Body parts bathed by patient: Right arm, Left arm, Chest, Abdomen, Front perineal  area, Right upper leg, Left upper leg, Right lower leg, Left lower leg, Face, Buttocks   Body parts bathed by helper: Buttocks     Bathing assist Assist Level: Contact Guard/Touching assist     Upper Body Dressing/Undressing Upper body dressing   What is the patient wearing?: Pull over shirt    Upper body assist Assist Level: Supervision/Verbal cueing    Lower Body Dressing/Undressing Lower body dressing      What is the patient wearing?: Underwear/pull up, Pants     Lower body assist Assist for lower body dressing: Minimal Assistance - Patient > 75%     Toileting Toileting    Toileting assist Assist for toileting: Contact Guard/Touching assist     Transfers Chair/bed transfer  Transfers assist     Chair/bed transfer assist level: Minimal Assistance - Patient > 75%     Locomotion Ambulation   Ambulation assist      Assist level: Minimal Assistance - Patient > 75% Assistive device: Walker-rolling Max distance: 100   Walk 10 feet activity   Assist     Assist level: Minimal Assistance - Patient > 75% Assistive device: Walker-rolling(HO, AFO)   Walk 50 feet activity   Assist Walk 50 feet with 2 turns activity did not occur: Safety/medical concerns  Assist level: Minimal Assistance - Patient > 75% Assistive device: Walker-rolling    Walk 150 feet activity   Assist Walk 150 feet activity did not occur: Safety/medical concerns  Assist level: Dependent - Patient 0% Assistive device: Lite Gait    Walk 10 feet on uneven surface  activity   Assist Walk 10 feet on uneven surfaces activity did not  occur: Safety/medical concerns         Wheelchair     Assist   Type of Wheelchair: Manual    Wheelchair assist level: Supervision/Verbal cueing Max wheelchair distance: 150    Wheelchair 50 feet with 2 turns activity    Assist        Assist Level: Supervision/Verbal cueing   Wheelchair 150 feet activity     Assist   Wheelchair 150 feet activity did not occur: Safety/medical concerns   Assist Level: Supervision/Verbal cueing   Blood pressure (!) 160/61, pulse 68, temperature 98 F (36.7 C), temperature source Oral, resp. rate 18, height '5\' 6"'  (1.676 m), weight 86.2 kg, SpO2 99 %.  Medical Problem List and Plan: 1.  Left-sided weakness secondary to acute perforator infarct at the right basal ganglia corona radiata onset 03/07/2019.  Recommendations for 30-day cardiac event monitor Team conference today please see physician documentation under team conference tab, met with team face-to-face to discuss problems,progress, and goals. Formulized individual treatment plan based on medical history, underlying problem and comorbidities.  PRAFO nightly  WHO QHS 2.  Antithrombotics: -DVT/anticoagulation: Subcutaneous Lovenox             -antiplatelet therapy: Aspirin 81 mg daily, Plavix 75 mg daily x3 weeks (~9/28) then aspirin alone 3. Pain Management: Tylenol as needed 4. Mood: Provide emotional support             -antipsychotic agents: N/A 5. Neuropsych: This patient is capable of making decisions on her own behalf. 6. Skin/Wound Care: Routine skin checks 7. Fluids/Electrolytes/Nutrition: Routine in and outs 8.  Hypertension.    Patient on Lasix 20 mg daily, Cozaar 50 mg daily, Toprol-XL 50 mg daily prior to admission.   Vitals:   03/30/19 0530 03/30/19 0733  BP: (!) 160/61   Pulse: 66 68  Resp: 16 18  Temp: 98 F (36.7 C)   SpO2: 99% 99%   Cozaar 25 started on 9/17, increased to 50 on 9/19, increased to 100 on 9/21  Norvasc 5 started on 9/23, increased to 7.5 on 9/25 Elevated increase amlodipine to 27m first dose 9/30 9.  Diabetes mellitus with hyperglycemia.  Hemoglobin A1c 7.2. Check blood sugars before meals and at bedtime CBG (last 3)  Recent Labs    03/29/19 1640 03/29/19 2100 03/30/19 0634  GLUCAP 160* 179* 108*    Added glimepiride 175mon 9/17, increased to 2 mg on 9/21, DC'd on 9/24  due to hypoglycemia    -sugars higher in PM  - AM lantus adjusted to 10u and PM lantus- reduce to 32U   -in range 9/30 10.  Hypothyroidism.  Continue supplement 11.  CKD stage III.  Baseline creatinine 1.2-1.35.   Creatinine 1.31 on 9/24  Continue to monitor 12.  Hyperlipidemia.  Patient intolerant with pravastatin and other statins.  Follow-up outpatient with PCP 13.  Constipation:   Stool softener started on 9/19 14.  Acute blood loss anemia  Hemoglobin 11.9 on 9/21  Continue to monitor 15. HS Cough:  Added prn robitussin   Improving   LOS: 21 days A FACE TO FACE EVALUATION WAS PERFORMED  AnCharlett Blake/30/2020, 7:58 AM

## 2019-03-30 NOTE — Progress Notes (Signed)
Physical Therapy Session Note  Patient Details  Name: Julia Banks MRN: [id number] Date of Birth: [birth year]  Today's Date: 03/30/2019 PT Individual Time: 1300-1415 PT Individual Time Calculation (min): 75 min   Short Term Goals: Week 2:  PT Short Term Goal 1 (Week 2): STG=LTG due to ELOS    Skilled Therapeutic Interventions/Progress Updates:  Pt received upright in wheelchair. SPT donned pt's shoes and L AFO. Pt self propelled wheelchair from outside of room to therapy gym, approximately 150 feet with set up asisst. Pt completed obstacle coursex2 consisting of stepping up and over hockey stick, navigating cones with the walker, and cone taps 2x4 bilaterally. Pt required verbal cueing for task and safety. Then pt had AFO orthotic consult in therapy gym. Pt ambulated 30 feetx3 with RW and CGA during AFO orthotic consult with recommendation for posterior support AFO and wedge to assist with control of genu recurvatum and reduce pressure on anterior tibia. Pt transported to the day room. Pt transferred stand pivot with RW and minA to the Nustep. Pt completed the Nustep with the following parameters: 12 minutes total, RPE: 13, Workload: 4, Total Steps: 614. Pt took intermittent rest breaks d/t fatigue as needed. Pt ambulated back towards her room about 150 feet with L AFO and RW and CGA. Pt required verbal cueing for gait speed, step width, and safety. Pt left sitting up in bed, call bell within reach, bed alarm set, and all needs met at this time.     Therapy Documentation Precautions:  Precautions Precautions: Fall Precaution Comments: left hemi Restrictions Weight Bearing Restrictions: No  Pain: denies pain  Pain Assessment Pain Scale: 0-10 Pain Score: 6  Pain Type: Acute pain Pain Location: Head Pain Orientation: Anterior;Posterior Pain Descriptors / Indicators: Aching Pain Frequency: Intermittent Pain Onset: Gradual Patients Stated Pain Goal: 2 Pain Intervention(s): Medication  (See eMAR) Multiple Pain Sites: No Therapy/Group: Individual Therapy  Olena Leatherwood, SPT  03/30/2019, 3:55 PM

## 2019-03-30 NOTE — Discharge Summary (Signed)
Physician Discharge Summary  Patient ID: NAVEAH NEVEU MRN: [id number] DOB/AGE: 1940-08-29 78 y.o.  Admit date: 03/09/2019 Discharge date: 04/01/2019  Discharge Diagnoses:  Principal Problem:   Cerebrovascular accident (CVA) of right basal ganglia ([place]) Active Problems:   Hemiparesis affecting left side as late effect of cerebrovascular accident (CVA) ([place])   Acute blood loss anemia   Labile blood glucose   Controlled type 2 diabetes mellitus with hyperglycemia, with long-term current use of insulin ([place])   Essential hypertension   Hypertensive crisis   Hypoglycemia DVT prophylaxis Hypothyroidism CKD stage II Hyperlipidemia  Discharged Condition: Stable  Significant Diagnostic Studies: Ct Angio Head W Or Wo Contrast  Result Date: 03/07/2019 CLINICAL DATA:  Left-sided weakness EXAM: CT ANGIOGRAPHY HEAD AND NECK TECHNIQUE: Multidetector CT imaging of the head and neck was performed using the standard protocol during bolus administration of intravenous contrast. Multiplanar CT image reconstructions and MIPs were obtained to evaluate the vascular anatomy. Carotid stenosis measurements (when applicable) are obtained utilizing NASCET criteria, using the distal internal carotid diameter as the denominator. CONTRAST:  142mL OMNIPAQUE IOHEXOL 350 MG/ML SOLN COMPARISON:  None. FINDINGS: CTA NECK FINDINGS Aortic arch: Mild atherosclerotic plaque.  Three vessel branching. Right carotid system: Mixed density plaque at the ICA bulb/bifurcation with 25% ICA origin narrowing. Negative for ulceration or beading. Left carotid system: Mild atherosclerotic plaque at the bifurcation. No stenosis or ulceration. Vertebral arteries: Proximal subclavian atherosclerosis without flow limiting stenosis. Strong right vertebral artery dominance. No acquired vertebral stenosis. Skeleton: Spondylosis with bridging cervical and upper thoracic osteophytes. Other neck: Negative Upper chest: Negative Review of the MIP  images confirms the above findings CTA HEAD FINDINGS Anterior circulation: Atherosclerotic calcification on the carotid siphons. No branch occlusion or proximal flow limiting stenosis. Negative for aneurysm. Posterior circulation: Strong right vertebral artery dominance. Most left vertebral flow is to the PICA. Basilar is diffusely patent and small in the setting of bilateral fetal type PCA. No branch occlusion. Negative for aneurysm Venous sinuses: Diffusely patent Anatomic variants: As above Review of the MIP images confirms the above findings IMPRESSION: 1. No emergent finding. 2. Atherosclerosis without flow limiting stenosis in the head and neck. Electronically Signed   By: Monte Fantasia M.D.   On: 03/07/2019 10:54   Ct Angio Neck W Or Wo Contrast  Result Date: 03/07/2019 CLINICAL DATA:  Left-sided weakness EXAM: CT ANGIOGRAPHY HEAD AND NECK TECHNIQUE: Multidetector CT imaging of the head and neck was performed using the standard protocol during bolus administration of intravenous contrast. Multiplanar CT image reconstructions and MIPs were obtained to evaluate the vascular anatomy. Carotid stenosis measurements (when applicable) are obtained utilizing NASCET criteria, using the distal internal carotid diameter as the denominator. CONTRAST:  170mL OMNIPAQUE IOHEXOL 350 MG/ML SOLN COMPARISON:  None. FINDINGS: CTA NECK FINDINGS Aortic arch: Mild atherosclerotic plaque.  Three vessel branching. Right carotid system: Mixed density plaque at the ICA bulb/bifurcation with 25% ICA origin narrowing. Negative for ulceration or beading. Left carotid system: Mild atherosclerotic plaque at the bifurcation. No stenosis or ulceration. Vertebral arteries: Proximal subclavian atherosclerosis without flow limiting stenosis. Strong right vertebral artery dominance. No acquired vertebral stenosis. Skeleton: Spondylosis with bridging cervical and upper thoracic osteophytes. Other neck: Negative Upper chest: Negative Review of  the MIP images confirms the above findings CTA HEAD FINDINGS Anterior circulation: Atherosclerotic calcification on the carotid siphons. No branch occlusion or proximal flow limiting stenosis. Negative for aneurysm. Posterior circulation: Strong right vertebral artery dominance. Most left vertebral flow is to the PICA.  Basilar is diffusely patent and small in the setting of bilateral fetal type PCA. No branch occlusion. Negative for aneurysm Venous sinuses: Diffusely patent Anatomic variants: As above Review of the MIP images confirms the above findings IMPRESSION: 1. No emergent finding. 2. Atherosclerosis without flow limiting stenosis in the head and neck. Electronically Signed   By: Monte Fantasia M.D.   On: 03/07/2019 10:54   Mr Brain Wo Contrast  Result Date: 03/07/2019 CLINICAL DATA:  Left-sided weakness this morning EXAM: MRI HEAD WITHOUT CONTRAST TECHNIQUE: Multiplanar, multiecho pulse sequences of the brain and surrounding structures were obtained without intravenous contrast. COMPARISON:  Head CT and CTA from earlier today FINDINGS: Brain: Acute perforator infarct on the right affecting corona radiata, posterior caudate, and upper putamen. No pre-existing infarct. No hemorrhage, hydrocephalus, collection, or masslike finding Vascular: No change from prior CTA. Skull and upper cervical spine: Negative for marrow lesion. Sinuses/Orbits: Negative IMPRESSION: Acute perforator infarct at the right basal ganglia and corona radiata. Electronically Signed   By: Monte Fantasia M.D.   On: 03/07/2019 14:31   Ct Head Code Stroke Wo Contrast  Result Date: 03/07/2019 CLINICAL DATA:  Code stroke.  Left-sided weakness EXAM: CT HEAD WITHOUT CONTRAST TECHNIQUE: Contiguous axial images were obtained from the base of the skull through the vertex without intravenous contrast. COMPARISON:  None. FINDINGS: Brain: No evidence of acute infarction, hemorrhage, hydrocephalus, extra-axial collection or mass lesion/mass  effect. Vascular: No hyperdense vessel.  Atherosclerotic calcification. Skull: Normal. Negative for fracture or focal lesion. Sinuses/Orbits: Essentially clear sinuses. Bone island or other benign process posterior to the right sphenoid sinus. Other: These results were communicated to Dr. Rory Percy at 10:11 amon 9/7/2020by text page via the [facility] messaging system. ASPECTS [facility] Stroke Program Early CT Score) - Ganglionic level infarction (caudate, lentiform nuclei, internal capsule, insula, M1-M3 cortex): 7 - Supraganglionic infarction (M4-M6 cortex): 3 Total score (0-10 with 10 being normal): 10 IMPRESSION: 1. No acute finding. 2. ASPECTS is 10. Electronically Signed   By: Monte Fantasia M.D.   On: 03/07/2019 10:12    Labs:  Basic Metabolic Panel: Recent Labs  Lab 03/30/19 0514  CREATININE 1.31*    CBC: No results for input(s): WBC, NEUTROABS, HGB, HCT, MCV, PLT in the last 168 hours.  CBG: Recent Labs  Lab 03/30/19 2121 03/31/19 0558 03/31/19 1128 03/31/19 1649 03/31/19 2121  GLUCAP 191* 98 121* 278* 136*   Family history.  Mother with hyperlipidemia, CVA, hypertension, diabetes mellitus as well as parathyroid adenoma.  Father with hyperlipidemia CAD with myocardial infarction hypertension.  Brother with leukemia.  Maternal uncle with colon cancer.  Negative for Crohn's disease or ulcerative colitis  Brief HPI:   TIMYAH TEBBE is a 78 y.o. right-handed female with history of hypertension, hyperlipidemia, diabetes mellitus, CAD, CKD stage III.  Per chart review lives alone independent prior to admission.  Presented 03/07/2019 with left-sided weakness as well as mild dysarthria.  Cranial CT scan negative.  Patient did not receive TPA.  CT angiogram of head and neck with no emergent findings.  MRI showed acute perforator infarct at the right basal ganglia and corona radiata.  Echocardiogram with ejection fraction of [id number] normal systolic function.  Neurology consulted maintain on aspirin and  Plavix x3 weeks then aspirin alone.  Subcutaneous Lovenox for DVT prophylaxis.  Arrangements made for 30-day cardiac event monitor.  Tolerating a regular diet.  Patient was admitted for a comprehensive rehab program   Hospital Course: MARYETTE WOLBERT was admitted to rehab  03/09/2019 for inpatient therapies to consist of PT, ST and OT at least three hours five days a week. Past admission physiatrist, therapy team and rehab RN have worked together to provide customized collaborative inpatient rehab.  Pertaining to patient right basal ganglia corona radiata infarction remained stable maintained on aspirin and Plavix x3 weeks then aspirin alone.  Patient would follow neurology services.  Subtends Lovenox for DVT prophylaxis no bleeding episodes.  Blood pressure controlled with Norvasc 10 mg daily as well as Cozaar.  Patient would follow-up primary MD.  Diabetes mellitus with peripheral neuropathy hemoglobin A1c 7.2 patient maintained on Lantus insulin as directed with full diabetic teaching.  Hypothyroidism with hormone supplement as directed.  CKD stage III latest creatinine 1.31 remaining stable.   Blood pressures were monitored on TID basis and stable  Diabetes has been monitored with ac/hs CBG checks and SSI was use prn for tighter BS control.   Marlana Salvage is continent of bowel and bladder.  Marlana Salvage has made gains during rehab stay and is attending therapies  She will continue to receive follow up therapies after discharge  Rehab course: During patient's stay in rehab weekly team conferences were held to monitor patient's progress, set goals and discuss barriers to discharge. At admission, patient required moderate assist stand pivot transfers, moderate assist sit to supine, moderate assist supine to sit.  Minimal assist upper body bathing max is lower body bathing minimal assist upper body dressing max is lower body dressing moderate assist toilet transfers  Physical exam.  Blood pressure 166/57 pulse 76  temperature 98.1 respirations 22 oxygen saturations 97% room air Neurological.  Alert no acute distress makes good eye contact with examiner. General.  No acute distress Mood and affect are appropriate Cardiac.  Regular rate rhythm no rubs murmurs or extra sounds Respiratory.  Clear to auscultation breathing unlabored no rales or wheezes GI.  Abdomen soft nontender positive bowel sounds without rebound Neck.  Supple nontender no JVD no thyromegaly no tracheal deviation Extremities.  No clubbing cyanosis or edema Neurological.  Cranial nerves II through XII intact motor strength 5 out of 5 right deltoid, bicep, tricep, grip, hip flexor, knee extensors, ankle dorsi flexor and plantar flexion.  2- left deltoid 3- tricep trace finger flexors 3- left hip flexor 4- knee extensor 0 out of 5 ankle dorsi and plantar flexion.  Sensation intact to light touch  Marlana Salvage  has had improvement in activity tolerance, balance, postural control as well as ability to compensate for deficits. Marlana Salvage has had improvement in functional use RUE/LUE  and RLE/LLE as well as improvement in awareness.  Patient perform bed mobility with supervision and stand pivot transfers with rolling walker and contact-guard assist.  Propelled wheelchair 2-day room participated in exercises.  Transfer to [facility] and performed STS with visual feedback.  Patient perform marching on Kinetron.  Up and down stairs contact-guard assist.  Patient to go to the bathroom with supervision for supine to sit and stand pivot transfers into wheelchair.  Patient removed all clothing while seated on commode for safety.  Ambulated with moderate hand-held assist from toilet to shower and seated edge of bed for bathing and shower level.  Full family teaching completed plan discharge to home       Disposition: Discharge to home Discharge disposition: 01-Home or Self Care        Diet: Diabetic diet  Special Instructions: No driving smoking or  alcohol Aspirin Plavix x3 weeks then aspirin alone  1 Tylenol as needed 2  Norvasc 10 mg daily 3 aspirin 81 mg daily 4 Lantus insulin 10 units a.m. 32 units p.m. 5.  Synthroid 25 mcg Sunday Tuesday Thursday Saturday 50 mcg Monday Wednesday Friday 6.  Cozaar 100 mg daily 7 Dulera 2 puffs twice daily 8 Protonix 40 mg daily 9 Senokot S1 tablet nightly   Discharge Instructions    Ambulatory referral to Neurology   Complete by: As directed    An appointment is requested in approximately: 4 weeks right basal ganglia corona radiata infarction   Ambulatory referral to Physical Medicine Rehab   Complete by: As directed    Moderate complexity follow up 1-2 weeks right basal ganglia/corona radiata infarct      Follow-up Information    Jamse Arn, MD Follow up.   Specialty: Physical Medicine and Rehabilitation Why: Office to call for appointment Contact information: [place] [address] [phone]           Signed: Lavon Paganini [place] 04/01/2019, 5:00 AM

## 2019-03-30 NOTE — Progress Notes (Signed)
Recreational Therapy Session Note  Patient Details  Name: Julia Banks MRN: [id number] Date of Birth: [birth year] Today's Date: 03/30/2019 Time:  03-1029  Pain: no c/o Skilled Therapeutic Interventions/Progress Updates: Session focused on activity analysis and modifications for discharge.  Also discussed safety/fall hazards with emphasis on pet care and developed a plan to reduce risk of falling once home.  Also discussed available adaptive equipment for leisure interests, looked up pictures, videos and descriptions on-line for pt review.  Pt appreciative of the information.    Therapy/Group: Individual Therapy  Joee Iovine 03/30/2019, 2:13 PM

## 2019-03-30 NOTE — Progress Notes (Signed)
Physical Therapy Session Note  Patient Details  Name: Julia Banks MRN: [id number] Date of Birth: January 28, 1941  Today's Date: 03/30/2019 PT Individual Time: 1107-1203 PT Individual Time Calculation (min): 56 min   Short Term Goals: Week 2:  PT Short Term Goal 1 (Week 2): STG=LTG due to ELOS  Skilled Therapeutic Interventions/Progress Updates: Pt presented in bed agreeable to therapy. Pt indicated HA but stated was premedicated earlier. Pt performed bed mobility with supervision and stand pivot transfer with RW with CGA. Pt propelled to day room and participated in L NMR with use of Cybex Kinetron. Pt transferred to [facility] and performed STS with visual feedback while trying to maintain foot pedals even. Pt was able to successfully performed level pedals 12/10 times (3 x 5) with verbal cues. Pt also performed marching on Kinetron at cm/sec for forced use of LLE. Pt returned to w/c and transported to rehab gym. Participated in stair training ascending/descending x 4 steps with CGA ascending and minA descending backwards with use of L rail. Pt propelled back to room supervision and performed stand pivot transfer to bed CGA. Pt returned to bed in same manner and repositioned self to comfort. Pt left with call bell within reach, bed alarm on, and needs met.      Therapy Documentation Precautions:  Precautions Precautions: Fall Precaution Comments: left hemi Restrictions Weight Bearing Restrictions: No General:   Vital Signs:  Pain:      Therapy/Group: Individual Therapy  Arzu Mcgaughey  Tressia Labrum, PTA  03/30/2019, 12:38 PM

## 2019-03-30 NOTE — Progress Notes (Signed)
Occupational Therapy Session Note  Patient Details  Name: Julia Banks MRN: [id number] Date of Birth: [birth year]  Today's Date: 03/30/2019 OT Individual Time: [phone] OT Individual Time Calculation (min): 60 min    Short Term Goals: Week 3:  OT Short Term Goal 1 (Week 3): STGs=LTGs due to ELOS  Skilled Therapeutic Interventions/Progress Updates:    Upon entering the room, pt supine in bed and finishing breakfast. Pt with no c/o pain and reports feeling much better this morning. Pt requests to go to bathroom with supervision for supine >sit and stand pivot transfer into wheelchair. OT assisted pt via wheelchair into bathroom for toileting needs with min guard for transfer and min A for standing balance during clothing management and hygiene. Pt removing all clothing while seated on commode for safety. Pt ambulating with mod HHA from toilet> shower and seated on TTB for bathing at shower level. Pt utilized LH sponge to increase I with self care tasks and lateral leans to wash buttocks. Pt exiting the shower in same manner and seated in wheelchair at sink for grooming with supervision, UB self care with set up A, and LB self care with min A utilizing hemiplegic technique and figure four position. Pt engaged in AAROM exercises for L UE and OT assisted with decreasing compensatory movements. Pt with more digit and wrist movement this session! Pt requesting to return to bed at end of session and did so with min A to bed and supervision for sit >supine. Call bell and all needed items within reach. Bed alarm activated.   Therapy Documentation Precautions:  Precautions Precautions: Fall Precaution Comments: left hemi Restrictions Weight Bearing Restrictions: No   Therapy/Group: Individual Therapy  Gypsy Decant 03/30/2019, 12:50 PM

## 2019-03-31 ENCOUNTER — Inpatient Hospital Stay (HOSPITAL_COMMUNITY): Payer: Medicare Other | Admitting: Occupational Therapy

## 2019-03-31 ENCOUNTER — Inpatient Hospital Stay (HOSPITAL_COMMUNITY): Payer: Medicare Other | Admitting: Physical Therapy

## 2019-03-31 LAB — GLUCOSE, CAPILLARY
Glucose-Capillary: 98 mg/dL (ref 70–99)
Glucose-Capillary: 121 mg/dL — ABNORMAL HIGH (ref 70–99)
Glucose-Capillary: 278 mg/dL — ABNORMAL HIGH (ref 70–99)
Glucose-Capillary: 136 mg/dL — ABNORMAL HIGH (ref 70–99)

## 2019-03-31 MED ORDER — ACETAMINOPHEN 325 MG PO TABS: 650.0000 mg | ORAL_TABLET | ORAL | Status: DC | PRN

## 2019-03-31 MED ORDER — AMLODIPINE BESYLATE 10 MG PO TABS: 10.0000 mg | ORAL_TABLET | Freq: Every day | ORAL | 0 refills | Status: DC

## 2019-03-31 MED ORDER — LOSARTAN POTASSIUM 100 MG PO TABS: 100.0000 mg | ORAL_TABLET | Freq: Every day | ORAL | 1 refills | Status: DC

## 2019-03-31 MED ORDER — FLUTICASONE-SALMETEROL 250-50 MCG/DOSE IN AEPB: 1.0000 | INHALATION_SPRAY | Freq: Two times a day (BID) | RESPIRATORY_TRACT | 0 refills | Status: DC

## 2019-03-31 MED ORDER — PANTOPRAZOLE SODIUM 40 MG PO TBEC: 40.0000 mg | DELAYED_RELEASE_TABLET | Freq: Every day | ORAL | 0 refills | Status: DC

## 2019-03-31 MED ORDER — INSULIN GLARGINE 100 UNIT/ML SOLOSTAR PEN: 32.0000 [IU] | PEN_INJECTOR | Freq: Every day | SUBCUTANEOUS | 11 refills | Status: DC

## 2019-03-31 MED ORDER — INSULIN GLARGINE 100 UNIT/ML SOLOSTAR PEN: 10.0000 [IU] | PEN_INJECTOR | Freq: Every day | SUBCUTANEOUS | 11 refills | Status: DC

## 2019-03-31 MED ORDER — PRAVASTATIN SODIUM 40 MG PO TABS: 40.0000 mg | ORAL_TABLET | Freq: Every day | ORAL | 3 refills | Status: DC

## 2019-03-31 NOTE — Patient Care Conference (Signed)
Inpatient RehabilitationTeam Conference and Plan of Care Update Date:    Time: 10:50 AM    Patient Name: Julia Banks      Medical Record Number: [id number]  Date of Birth: [birth year] Sex: Female         Room/Bed: 4W02C/4W02C-01 Payor Info: Payor: MEDICARE / Plan: MEDICARE PART A AND B / Product Type: *No Product type* /    Admit Date/Time:  03/09/2019  4:05 PM  Primary Diagnosis:  Cerebrovascular accident (CVA) of right basal ganglia [facility])  Patient Active Problem List   Diagnosis Date Noted  . Hypoglycemia   . Hypertensive crisis   . Acute blood loss anemia   . Labile blood glucose   . Controlled type 2 diabetes mellitus with hyperglycemia, with long-term current use of insulin ([place])   . Essential hypertension   . Cerebrovascular accident (CVA) of right basal ganglia ([place]) 03/09/2019  . Hemiparesis affecting left side as late effect of cerebrovascular accident (CVA) ([place]) 03/09/2019  . CVA (cerebral vascular accident) ([place]) 03/08/2019  . Ischemic stroke ([place]) 03/07/2019  . CKD (chronic kidney disease), stage III 03/07/2019  . Advanced care planning/counseling discussion 01/27/2016  . Sensorineural hearing loss (SNHL) of both ears 01/27/2016  . Chest discomfort 01/27/2016  . Insomnia 02/13/2015  . Chronic cough 10/10/2014  . Osteopenia 07/31/2014  . S/P mastectomy 06/07/2014  . Genetic testing 06/02/2014  . Breast cancer of upper-outer quadrant of left female breast ([place]) 03/28/2014  . Grieving 10/25/2013  . Secondary hyperparathyroidism of renal origin ([place])   . Medicare annual wellness visit, subsequent 06/28/2013  . Diabetes mellitus with stage 3 chronic kidney disease ([place])   . Hypothyroidism 04/13/2013  . GERD (gastroesophageal reflux disease)   . Essential hypertension, benign 09/17/2012  . HLD (hyperlipidemia) 09/17/2012  . Type 2 diabetes, uncontrolled, with neuropathy ([place]) 02/15/2012  . OSA on CPAP 02/13/2012  . Coronary artery disease 02/13/2012     Expected Discharge Date: Expected Discharge Date: 04/01/19  Team Members Present: Physician leading conference: Dr. Alysia Penna Social Worker Present: Lennart Pall, LCSW Nurse Present: Rayetta Humphrey, RN PT Present: Barrie Folk, PT;Rosita Dechalus, PTA OT Present: Darleen Crocker, OT SLP Present: Stormy Fabian, SLP PPS Coordinator present : Gunnar Fusi, SLP     Current Status/Progress Goal Weekly Team Focus  Bowel/Bladder   continent of b/b; LBM: 09/29  remain continent of b/b  assist with tolieting needs prn   Swallow/Nutrition/ Hydration             ADL's   set up A UB self care, min - mod A LB self care, min A functional transfers, min - mod A toileting. L UE strength improving proximal > distal  S - min A  NMR, functional transfers, standing balance, ADL retraining, pt/family education   Mobility   supervision bed mobility, minA to CGA STS transfer pending fatigue level, CGA stand pivot transfers with RW, CGA squat pivot transfers, CGA gait shorter distances, minA when fatigued for longer distances, supervision w/c mobilt, minA stairs  Supervision transfers. CGA gait  gait, balance, endurance   Communication             Safety/Cognition/ Behavioral Observations            Pain   c/o generalized pain; prn tylenol  pain <3/10  assess pain level QS and prn   Skin   skin intact  maintain skin integrity, remain free of new skin breakdown/infection  assess skin QS and prn      *  See Care Plan and progress notes for long and short-term goals.     Barriers to Discharge  Current Status/Progress Possible Resolutions Date Resolved   Nursing                  PT                    OT                  SLP                SW                Discharge Planning/Teaching Needs:  Family to provide 24 hr care,  family education completed. Niece very involved and arranging care for pt  completed   Team Discussion:  BSs under better control;  No nsg issues.  S - min  overall with PT/OT.  On track for goals.    Revisions to Treatment Plan:  NA    Medical Summary Current Status: Diabetic management improving but requiring adjustment, HTN uncontrolled Weekly Focus/Goal: DC planning, med management  Barriers to Discharge: Medical stability   Possible Resolutions to Barriers: See above   Continued Need for Acute Rehabilitation Level of Care: The patient requires daily medical management by a physician with specialized training in physical medicine and rehabilitation for the following reasons: Direction of a multidisciplinary physical rehabilitation program to maximize functional independence : Yes Medical management of patient stability for increased activity during participation in an intensive rehabilitation regime.: Yes Analysis of laboratory values and/or radiology reports with any subsequent need for medication adjustment and/or medical intervention. : Yes   I attest that I was present, lead the team conference, and concur with the assessment and plan of the team.   Lennart Pall 03/31/2019, 2:54 PM   Team conference was held via web/ teleconference due to [place] - 19

## 2019-03-31 NOTE — Progress Notes (Signed)
[place] PHYSICAL MEDICINE & REHABILITATION PROGRESS NOTE   Subjective/Complaints:  Left wrist pain no trauma, wears resting splint at noc  ROS: Patient denies  nausea, vomiting, diarrhea, cough, shortness of breath or chest pain,  Objective:   No results found. No results for input(s): WBC, HGB, HCT, PLT in the last 72 hours. Recent Labs    03/30/19 0514  CREATININE 1.31*    Intake/Output Summary (Last 24 hours) at 03/31/2019 0728 Last data filed at 03/30/2019 1900 Gross per 24 hour  Intake 315 ml  Output -  Net 315 ml     Physical Exam: Vital Signs Blood pressure (!) 164/51, pulse 63, temperature 97.7 F (36.5 C), temperature source Oral, resp. rate 16, height 5\' 6"  (1.676 m), weight 86.2 kg, SpO2 99 %. Constitutional: No distress . Vital signs reviewed. HEENT: EOMI, oral membranes moist Neck: supple, no pain to palpation, no pain with ROM Cardiovascular: RRR without murmur. No JVD    Respiratory: CTA Bilaterally without wheezes or rales. Normal effort    GI: BS +, non-tender, non-distended  Skin: Warm and dry.  Intact. Psych: Normal mood.  Normal behavior. Musc: No edema in extremities.  Mild tenderness Left 1st [facility] Neurologic: Alert Dysarthria Motor: Left upper extremity: Shoulder abduction, elbow flexion/extension 2/5, handgrip 2- finger flexor trace ext Left lower extremity: 4-/5 hip flexion, knee extension, tr to 1/5 ankle dorsiflexion,stable  Assessment/Plan: 1. Functional deficits secondary to Left hemiparesis after Right subcortical infarct which require 3+ hours per day of interdisciplinary therapy in a comprehensive inpatient rehab setting.  Physiatrist is providing close team supervision and 24 hour management of active medical problems listed below.  Physiatrist and rehab team continue to assess barriers to discharge/monitor patient progress toward functional and medical goals  Care Tool:  Bathing    Body parts bathed by patient: Right arm, Left  arm, Chest, Abdomen, Front perineal area, Right upper leg, Left upper leg, Right lower leg, Left lower leg, Face, Buttocks   Body parts bathed by helper: Buttocks     Bathing assist Assist Level: Supervision/Verbal cueing     Upper Body Dressing/Undressing Upper body dressing   What is the patient wearing?: Pull over shirt    Upper body assist Assist Level: Supervision/Verbal cueing    Lower Body Dressing/Undressing Lower body dressing      What is the patient wearing?: Underwear/pull up, Pants     Lower body assist Assist for lower body dressing: Minimal Assistance - Patient > 75%     Toileting Toileting    Toileting assist Assist for toileting: Contact Guard/Touching assist     Transfers Chair/bed transfer  Transfers assist     Chair/bed transfer assist level: Contact Guard/Touching assist     Locomotion Ambulation   Ambulation assist      Assist level: Contact Guard/Touching assist Assistive device: Walker-rolling Max distance: 150   Walk 10 feet activity   Assist     Assist level: Contact Guard/Touching assist Assistive device: Walker-rolling, Orthosis   Walk 50 feet activity   Assist Walk 50 feet with 2 turns activity did not occur: Safety/medical concerns  Assist level: Contact Guard/Touching assist Assistive device: Walker-rolling    Walk 150 feet activity   Assist Walk 150 feet activity did not occur: Safety/medical concerns  Assist level: Contact Guard/Touching assist Assistive device: Walker-rolling, Orthosis    Walk 10 feet on uneven surface  activity   Assist Walk 10 feet on uneven surfaces activity did not occur: Safety/medical concerns  Wheelchair     Assist Will patient use wheelchair at discharge?: Yes Type of Wheelchair: Manual    Wheelchair assist level: Set up assist Max wheelchair distance: 150    Wheelchair 50 feet with 2 turns activity    Assist        Assist Level: Set up  assist   Wheelchair 150 feet activity     Assist  Wheelchair 150 feet activity did not occur: Safety/medical concerns   Assist Level: Set up assist   Blood pressure (!) 164/51, pulse 63, temperature 97.7 F (36.5 C), temperature source Oral, resp. rate 16, height 5\' 6"  (1.676 m), weight 86.2 kg, SpO2 99 %.  Medical Problem List and Plan: 1.  Left-sided weakness secondary to acute perforator infarct at the right basal ganglia corona radiata onset 03/07/2019.  Recommendations for 30-day cardiac event monitor CIR PT OT D/C in am   PRAFO nightly  WHO QHS 2.  Antithrombotics: -DVT/anticoagulation: Subcutaneous Lovenox             -antiplatelet therapy: Aspirin 81 mg daily, Plavix 75 mg daily x3 weeks (~9/28) then aspirin alone 3. Pain Management: Tylenol as needed 4. Mood: Provide emotional support             -antipsychotic agents: N/A 5. Neuropsych: This patient is capable of making decisions on her own behalf. 6. Skin/Wound Care: Routine skin checks 7. Fluids/Electrolytes/Nutrition: Routine in and outs 8.  Hypertension.    Patient on Lasix 20 mg daily, Cozaar 50 mg daily, Toprol-XL 50 mg daily prior to admission.   Vitals:   03/30/19 2020 03/31/19 0348  BP:  (!) 164/51  Pulse:  63  Resp:  16  Temp:  97.7 F (36.5 C)  SpO2: 99% 99%   Cozaar 25 started on 9/17, increased to 50 on 9/19, increased to 100 on 9/21  Norvasc 5 started on 9/23, increased to 7.5 on 9/25 Elevated increase amlodipine to 10mg  first dose 9/30 9.  Diabetes mellitus with hyperglycemia.  Hemoglobin A1c 7.2. Check blood sugars before meals and at bedtime CBG (last 3)  Recent Labs    03/30/19 1754 03/30/19 2121 03/31/19 0558  GLUCAP 162* 191* 98    Added glimepiride 1mg  on 9/17, increased to 2 mg on 9/21, DC'd on 9/24 due to hypoglycemia    -fair control 10/1  - AM lantus adjusted to 10u and PM lantus- reduce to 32U   -in range 10/1 10.  Hypothyroidism.  Continue supplement 11.  CKD stage III.   Baseline creatinine 1.2-1.35.   Creatinine 1.31 on 9/24  Continue to monitor 12.  Hyperlipidemia.  Patient intolerant with pravastatin and other statins.  Follow-up outpatient with PCP 13.  Constipation:   Stool softener started on 9/19 14.  Acute blood loss anemia  Hemoglobin 11.9 on 9/21  Continue to monitor 15. HS Cough:  Added prn robitussin   Improving   LOS: 22 days A FACE TO FACE EVALUATION WAS PERFORMED  Charlett Blake 03/31/2019, 7:28 AM

## 2019-03-31 NOTE — Progress Notes (Signed)
Social Work Patient ID: Julia Banks, female   DOB: December 28, 1940, 78 y.o.   MRN: [id number]   Have reveiwed team conf with pt who feels ready for d/c tomorrow.  Discussed f/u ad DME.  No concerns.  Bernarda Erck, LCSW

## 2019-03-31 NOTE — Discharge Instructions (Signed)
Inpatient Rehab Discharge Instructions  Julia Banks Discharge date and time: No discharge date for patient encounter.   Activities/Precautions/ Functional Status: Activity: activity as tolerated Diet: diabetic diet Wound Care: none needed Functional status:  ___ No restrictions     ___ Walk up steps independently ___ 24/7 supervision/assistance   ___ Walk up steps with assistance ___ Intermittent supervision/assistance  ___ Bathe/dress independently ___ Walk with walker     _x__ Bathe/dress with assistance ___ Walk Independently    ___ Shower independently ___ Walk with assistance    ___ Shower with assistance ___ No alcohol     ___ Return to work/school ________  Special Instructions: No driving smoking or alcohol  Continue aspirin and Plavix x3 weeks then aspirin alone  COMMUNITY REFERRALS UPON DISCHARGE:    Home Health:   PT & OT                                 Agency:  [facility] Phone: [phone]  Medical Equipment/Items Ordered:  [place] Lank, TUB BENCH AND 3 IN 1                                                                  Agency/Supplier: [facility]   [phone]    STROKE/TIA DISCHARGE INSTRUCTIONS SMOKING Cigarette smoking nearly doubles your risk of having a stroke & is the single most alterable risk factor  If you smoke or have smoked in the last 12 months, you are advised to quit smoking for your health.  Most of the excess cardiovascular risk related to smoking disappears within a year of stopping.  Ask you doctor about anti-smoking medications  [place] Quit Line: 1-800-QUIT NOW  Free Smoking Cessation Classes [phone]  CHOLESTEROL Know your levels; limit fat & cholesterol in your diet  Lipid Panel     Component Value Date/Time   CHOL 207 (H) 03/08/2019 0327   TRIG 160 (H) 03/08/2019 0327   HDL 31 (L) 03/08/2019 0327   CHOLHDL 6.7 03/08/2019 0327   VLDL 32 03/08/2019 0327   LDLCALC 144 (H) 03/08/2019 0327      Many patients benefit from treatment even if their cholesterol is at goal.  Goal: Total Cholesterol (CHOL) less than 160  Goal:  Triglycerides (TRIG) less than 150  Goal:  HDL greater than 40  Goal:  LDL (LDLCALC) less than 100   BLOOD PRESSURE American Stroke Association blood pressure target is less that 120/80 mm/Hg  Your discharge blood pressure is:  BP: (!) 170/60  Monitor your blood pressure  Limit your salt and alcohol intake  Many individuals will require more than one medication for high blood pressure  DIABETES (A1c is a blood sugar average for last 3 months) Goal HGBA1c is under 7% (HBGA1c is blood sugar average for last 3 months)  Diabetes:     Lab Results  Component Value Date   HGBA1C 6.8 (H) 03/08/2019     Your HGBA1c can be lowered with medications, healthy diet, and exercise.  Check your blood sugar as directed by your physician  Call your physician if you experience unexplained or low blood sugars.  PHYSICAL ACTIVITY/REHABILITATION Goal is 30 minutes at least  4 days per week  Activity: Increase activity slowly, Therapies: Physical Therapy: Home Health Return to work:   Activity decreases your risk of heart attack and stroke and makes your heart stronger.  It helps control your weight and blood pressure; helps you relax and can improve your mood.  Participate in a regular exercise program.  Talk with your doctor about the best form of exercise for you (dancing, walking, swimming, cycling).  DIET/WEIGHT Goal is to maintain a healthy weight  Your discharge diet is:  Diet Order            Diet Carb Modified Fluid consistency: Thin; Room service appropriate? Yes  Diet effective now              liquids Your height is:  Height: 5\' 6"  (167.6 cm) Your current weight is: Weight: 86 kg Your Body Mass Index (BMI) is:  BMI (Calculated): 30.62  Following the type of diet specifically designed for you will help prevent another stroke.  Your goal weight  range is:    Your goal Body Mass Index (BMI) is 19-24.  Healthy food habits can help reduce 3 risk factors for stroke:  High cholesterol, hypertension, and excess weight.  RESOURCES Stroke/Support Group:  Call [phone]   STROKE EDUCATION PROVIDED/REVIEWED AND GIVEN TO PATIENT Stroke warning signs and symptoms How to activate emergency medical system (call 911). Medications prescribed at discharge. Need for follow-up after discharge. Personal risk factors for stroke. Pneumonia vaccine given:  Flu vaccine given:  My questions have been answered, the writing is legible, and I understand these instructions.  I will adhere to these goals & educational materials that have been provided to me after my discharge from the hospital.      My questions have been answered and I understand these instructions. I will adhere to these goals and the provided educational materials after my discharge from the hospital.  Patient/Caregiver Signature _______________________________ Date __________  Clinician Signature _______________________________________ Date __________  Please bring this form and your medication list with you to all your follow-up doctor's appointments.

## 2019-03-31 NOTE — Progress Notes (Signed)
Occupational Therapy Discharge Summary  Patient Details  Name: Julia Banks MRN: [id number] Date of Birth: 1941-04-03  Today's Date: 03/31/2019 OT Individual Time: 0706-0800 and 1300-1410 OT Individual Time Calculation (min): 54 min and 70 min   Patient has met 13 of 13 long term goals due to improved activity tolerance, improved balance, postural control, ability to compensate for deficits, functional use of  LEFT upper and LEFT lower extremity and improved coordination.  Patient to discharge at overall S - min A level.  Patient's care partner is independent to provide the necessary physical assistance at discharge.    Reasons goals not met: all goals met  Recommendation:  Patient will benefit from ongoing skilled OT services in home health setting to continue to advance functional skills in the area of BADL, iADL and Reduce care partner burden.  Equipment: TTB and 3 in [facility]  Reasons for discharge: treatment goals met  Patient/family agrees with progress made and goals achieved: Yes   OT Intervention: Session 1: Upon entering the room, pt supine in bed with no c/o pain and reports not sleeping well last night. Pt attentive to L UE during functional transfers without cuing to do so. Pt performed stand pivot transfer into wheelchair with min guard and transferred onto commode chair in same manner.  Min guard for standing balance while performing LB clothing management. Pt able to void and performs hygiene with supervision. Pt transferred back into wheelchair and returned to sitting at sink for grooming tasks. Pt standing with min A and wash buttocks and peri area before getting dressed with use of hemiplegic technique and figure four positioning. Pt requesting to return to bed in order to rest until next session. Stand pivot with min guard and supervision for sit >supine. Call bell and all needed items within reach. Bed alarm activated.   Session 2: Upon entering the room, pt seated on  EOB with RN present in the room and requesting to use bathroom. Stand pivot into wheelchair with min guard. OT assisting pt into bathroom via wheelchair and pt transferred onto elevated toilet in same manner. Min guard for standing balance while pt perform clothing management. Pt able to perform hygiene independently while seated. Pt performed hand hygiene at sink with supervision before exiting the room. Pt transferred onto standard 27 inch bed from wheelchair with min A and demonstrated sit <>supine from late bed with min guard and min cuing for technique. Pt was able to roll and adjust self upwards closer to [facility] while supine as well. Pt returning to wheelchair in same manner as above and OT assisted pt outside into fresh air. NMR for L UE for AAROM and PROM in all planes of movement. Pt returned to room and assisted back into bed with min guard stand pivot transfer and supervision sit >supine. Call bell and all needed items within reach. Bed alarm activated. Pt with no further questions regarding discharge.  OT Discharge Precautions/Restrictions  Precautions Precautions: Fall Precaution Comments: left hemi   Pain Pain Assessment Pain Scale: 0-10 Pain Score: 0-No pain Vision Baseline Vision/History: Wears glasses Wears Glasses: Reading only Cognition Overall Cognitive Status: Within Functional Limits for tasks assessed Arousal/Alertness: Awake/alert Orientation Level: Oriented X4 Sensation Sensation Light Touch: Appears Intact Hot/Cold: Appears Intact Proprioception: Appears Intact Stereognosis: Not tested Coordination Gross Motor Movements are Fluid and Coordinated: No Fine Motor Movements are Fluid and Coordinated: No Coordination and Movement Description: improved since eval  but still with significant deficit Motor  Motor Motor -  Discharge Observations: L hemi UE> LE  but        improved since eval Mobility  Bed Mobility Bed Mobility: Supine to Sit;Sit to Supine Supine to  Sit: Supervision/Verbal cueing Sit to Supine: Supervision/Verbal cueing Transfers Sit to Stand: Moderate Assistance - Patient 50-74% Stand to Sit: Contact Guard/Touching assist  Trunk/Postural Assessment  Cervical Assessment Cervical Assessment: Within Functional Limits Thoracic Assessment Thoracic Assessment: Exceptions to WFL(rounded shoulders) Lumbar Assessment Lumbar Assessment: Exceptions to WFL(posterior pelvic tilt)  Balance Balance Balance Assessed: Yes Dynamic Sitting Balance Dynamic Sitting - Level of Assistance: 6: Modified independent (Device/Increase time) Dynamic Standing Balance Dynamic Standing - Level of Assistance: 4: Min assist Extremity/Trunk Assessment RUE Assessment RUE Assessment: Within Functional Limits LUE Assessment LUE Assessment: Exceptions to [facility] Passive Range of Motion (PROM) Comments: WFLs Active Range of Motion (AROM) Comments: assist to decrease compensatory movements with limited wrist  and digits General Strength Comments: 3-/5 shoulders and elbow with digits and  wrist being 2-/5   Gypsy Decant 03/31/2019, 8:36 AM

## 2019-03-31 NOTE — Progress Notes (Signed)
Physical Therapy Discharge Summary  Patient Details  Name: Julia Banks MRN: [id number] Date of Birth: May 03, 1941  Today's Date: 03/31/2019 PT Individual Time: 0920-1030 PT Individual Time Calculation (min): 70 min  Patient has met 11 out of 13 long term goals due to improvement in balance, strength, and coordination.  Patient to discharge at a wheelchair level Supervision.   Patient's care partner is independent to provide the necessary physical assistance at discharge.  Reasons goals not met: Pt requiring more assistance for safety during car transfer d/t persisting L side inattention.     Recommendation:  Patient will benefit from ongoing skilled PT services in home health setting to continue to advance safe functional mobility, address ongoing impairments in balance, strength, safety, and minimize fall risk.  Equipment: RW, WC  Reasons for discharge: treatment goals met and discharge from hospital  Patient/family agrees with progress made and goals achieved: Yes   PT Treatment  Pt received upright in bed and agreeable to treatment. SPT initiated discharge and treatment intervention. See below for details. Pt completed car transfer with RW, L AFO, and minA. Pt completed the following exercises: standing hip ABD with RW 2x8 bilaterally, standing hamstring curls 2x8 standing at counter. Then, pt completed the following HEP (2x8 bilaterally) including heel slides, bridges, clamshells, sit to stands, and standing mini squats. Pt was educated on proper form for the home exercises. Pt transported back to her room in wheelchair. Pt completed stand pivot transfer with CGA from wheelchair>bed. Pt left supine in bed posiitoned with pillow, call bell within reach, and bed alarm set. All needs met at this time.   PT Discharge Precautions/Restrictions Precautions Precautions: Fall Precaution Comments: left hemi Pain Denies pain  Vision/Perception  Wears glass for reading  Mild L side  inattention but can correct with cues   Cognition Overall Cognitive Status: Within Functional Limits for tasks assessed Arousal/Alertness: Awake/alert Orientation Level: Oriented X4 Attention: Focused;Sustained Sensation Coordination Gross Motor Movements are Fluid and Coordinated: No Fine Motor Movements are Fluid and Coordinated: No Coordination and Movement Description: improved since eval but still L>R UE and LE deficit Motor  Motor Motor: Hemiplegia;Ataxia Motor - Skilled Clinical Observations: L hemi UE>LE Motor - Discharge Observations: L hemi UE>LE but improved since eval  Mobility Bed Mobility Bed Mobility: Rolling Right;Rolling Left;Supine to Sit;Sit to Supine Rolling Right: Supervision/verbal cueing Rolling Left: Supervision/Verbal cueing Sit to Supine: Supervision/Verbal cueing Transfers Transfers: Sit to Stand;Stand to Sit;Stand Pivot Transfers Sit to Stand: Minimal Assistance - Patient > 75% Stand to Sit: Contact Guard/Touching assist Stand Pivot Transfers: Contact Guard/Touching assist Transfer (Assistive device): Rolling walker Locomotion  Gait Ambulation: Yes Gait Assistance: Minimal Assistance - Patient > 75% Assistive device: Rolling walker Gait Assistance Details: Verbal cues for precautions/safety Stairs / Additional Locomotion Stairs: Yes Stairs Assistance: Minimal Assistance - Patient > 75% Stair Management Technique: One rail Right Height of Stairs: 4 Ramp: Minimal Assistance - Patient >75% Product manager Mobility: Yes Wheelchair Assistance: Independent with assistive device Wheelchair Propulsion: Right upper extremity;Right lower extremity Wheelchair Parts Management: Needs assistance  Trunk/Postural Assessment  Cervical Assessment Cervical Assessment: Within Functional Limits Thoracic Assessment Thoracic Assessment: Within Functional Limits Lumbar Assessment Lumbar Assessment: Within Functional Limits Postural  Control Postural Control: Deficits on evaluation Postural Limitations: during ambulation  Balance Balance Balance Assessed: Yes Static Sitting Balance Static Sitting - Balance Support: Feet supported Static Sitting - Level of Assistance: 5: Stand by assistance Dynamic Sitting Balance Dynamic Sitting - Balance Support: During functional activity Dynamic  Sitting - Level of Assistance: 6: Modified independent (Device/Increase time) Static Standing Balance Static Standing - Balance Support: During functional activity Static Standing - Level of Assistance: 5: Stand by assistance Dynamic Standing Balance Dynamic Standing - Balance Support: During functional activity Dynamic Standing - Level of Assistance: 4: Min assist Extremity Assessment  RLE Assessment RLE Assessment: Within Functional Limits General Strength Comments: grossly WFL LLE Assessment General Strength Comments: L LE weaker than R LE  Olena Leatherwood, SPT  03/31/2019, 5:38 PM

## 2019-04-01 LAB — GLUCOSE, CAPILLARY: Glucose-Capillary: 121 mg/dL — ABNORMAL HIGH (ref 70–99)

## 2019-04-01 NOTE — Progress Notes (Signed)
Social Work Discharge Note   The overall goal for the admission was met for:   Discharge location: Yes - home with niece to provide 24/7 assistance  Length of Stay: Yes - 23 days  Discharge activity level: Yes - supervision overall  Home/community participation: Yes  Services provided included: MD, RD, PT, OT, SLP, RN, TR, Pharmacy, [place]: Medicare  Follow-up services arranged: Home Health: PT, OT via [facility], DME: 20x18 lightweight w/c, cushion, rolling walker, drop arm commode and tub bench via [place] and Patient/Family has no preference for HH/DME agencies  Comments (or additional information):       Contact info:  Niece, Laverna Peace @ [phone]  Patient/Family verbalized understanding of follow-up arrangements: Yes  Individual responsible for coordination of the follow-up plan: pt  Confirmed correct DME delivered: Lennart Pall 04/01/2019    Krisann Mckenna

## 2019-04-01 NOTE — Progress Notes (Signed)
Recreational Therapy Discharge Summary Patient Details  Name: Julia Banks MRN: [id number] Date of Birth: [birth year] Today's Date: 04/01/2019  Long term goals set: 1  Long term goals met: 1  Comments on progress toward goals: Pt has made great progress during LOS and is motivated to work hard to gain further independence.  TR sessions focused on activity analysis with potential adaptations, adaptive recreation/leisure resources, safety concerns with leisure and relaxation training.  Pt provided with education verbal and written and is appreciative of resources.  Pt requires set up assist for simple TR tasks, is Mod I with relaxation training. Reasons for discharge: discharge from hospital  Patient/family agrees with progress made and goals achieved: Yes  [name] 04/01/2019, 9:46 AM

## 2019-04-01 NOTE — Progress Notes (Signed)
[place] PHYSICAL MEDICINE & REHABILITATION PROGRESS NOTE   Subjective/Complaints:  Looking forward to d/c  ROS: Patient denies  nausea, vomiting, diarrhea, cough, shortness of breath or chest pain,  Objective:   No results found. No results for input(s): WBC, HGB, HCT, PLT in the last 72 hours. Recent Labs    03/30/19 0514  CREATININE 1.31*    Intake/Output Summary (Last 24 hours) at 04/01/2019 0737 Last data filed at 04/01/2019 0719 Gross per 24 hour  Intake 360 ml  Output -  Net 360 ml     Physical Exam: Vital Signs Blood pressure (!) 158/68, pulse 63, temperature 97.6 F (36.4 C), resp. rate 19, height 5\' 6"  (1.676 m), weight 86.2 kg, SpO2 99 %. Constitutional: No distress . Vital signs reviewed. HEENT: EOMI, oral membranes moist Neck: supple, no pain to palpation, no pain with ROM Cardiovascular: RRR without murmur. No JVD    Respiratory: CTA Bilaterally without wheezes or rales. Normal effort    GI: BS +, non-tender, non-distended  Skin: Warm and dry.  Intact. Psych: Normal mood.  Normal behavior. Musc: No edema in extremities.  Mild tenderness Left 1st [facility] Neurologic: Alert Dysarthria Motor: Left upper extremity: Shoulder abduction, elbow flexion/extension 2/5, handgrip 2- finger flexor trace ext Left lower extremity: 4-/5 hip flexion, knee extension, tr to 1/5 ankle dorsiflexion,stable  Assessment/Plan: 1. Functional deficits secondary to Left hemiparesis after Right subcortical infarct Stable for D/C today F/u PCP in 3-4 weeks F/u PM&R 2 weeks See D/C summary See D/C instructions Care Tool:  Bathing    Body parts bathed by patient: Right arm, Left arm, Chest, Abdomen, Front perineal area, Right upper leg, Left upper leg, Right lower leg, Left lower leg, Face, Buttocks   Body parts bathed by helper: Buttocks     Bathing assist Assist Level: Supervision/Verbal cueing     Upper Body Dressing/Undressing Upper body dressing   What is the patient  wearing?: Pull over shirt    Upper body assist Assist Level: Supervision/Verbal cueing    Lower Body Dressing/Undressing Lower body dressing      What is the patient wearing?: Underwear/pull up, Pants     Lower body assist Assist for lower body dressing: Contact Guard/Touching assist     Toileting Toileting    Toileting assist Assist for toileting: Contact Guard/Touching assist     Transfers Chair/bed transfer  Transfers assist     Chair/bed transfer assist level: Contact Guard/Touching assist     Locomotion Ambulation   Ambulation assist      Assist level: Contact Guard/Touching assist Assistive device: Walker-rolling Max distance: 155   Walk 10 feet activity   Assist     Assist level: Contact Guard/Touching assist Assistive device: Walker-rolling, Orthosis   Walk 50 feet activity   Assist Walk 50 feet with 2 turns activity did not occur: Safety/medical concerns  Assist level: Contact Guard/Touching assist Assistive device: Walker-rolling    Walk 150 feet activity   Assist Walk 150 feet activity did not occur: Safety/medical concerns  Assist level: Contact Guard/Touching assist Assistive device: Orthosis, Walker-rolling    Walk 10 feet on uneven surface  activity   Assist Walk 10 feet on uneven surfaces activity did not occur: Safety/medical concerns   Assist level: Contact Guard/Touching assist Assistive device: Walker-rolling, Orthosis   Wheelchair     Assist Will patient use wheelchair at discharge?: Yes Type of Wheelchair: Manual    Wheelchair assist level: Independent Max wheelchair distance: 150    Wheelchair 50 feet  with 2 turns activity    Assist        Assist Level: Independent   Wheelchair 150 feet activity     Assist  Wheelchair 150 feet activity did not occur: Safety/medical concerns   Assist Level: Independent   Blood pressure (!) 158/68, pulse 63, temperature 97.6 F (36.4 C), resp. rate  19, height 5\' 6"  (1.676 m), weight 86.2 kg, SpO2 99 %.  Medical Problem List and Plan: 1.  Left-sided weakness secondary to acute perforator infarct at the right basal ganglia corona radiata onset 03/07/2019.  Recommendations for 30-day cardiac event monitor D/c  PRAFO nightly  WHO QHS 2.  Antithrombotics: -DVT/anticoagulation: Subcutaneous Lovenox             -antiplatelet therapy: Aspirin 81 mg daily, Plavix 75 mg daily x3 weeks (~9/28) then aspirin alone 3. Pain Management: Tylenol as needed 4. Mood: Provide emotional support             -antipsychotic agents: N/A 5. Neuropsych: This patient is capable of making decisions on her own behalf. 6. Skin/Wound Care: Routine skin checks 7. Fluids/Electrolytes/Nutrition: Routine in and outs 8.  Hypertension.    Patient on Lasix 20 mg daily, Cozaar 50 mg daily, Toprol-XL 50 mg daily prior to admission.   Vitals:   03/31/19 2058 04/01/19 0612  BP:  (!) 158/68  Pulse:  63  Resp:  19  Temp:  97.6 F (36.4 C)  SpO2: 98% 99%   Cozaar 25 started on 9/17, increased to 50 on 9/19, increased to 100 on 9/21  Norvasc 5 started on 9/23, increased to 7.5 on 9/25 Elevated increase amlodipine to 10mg  first dose 9/30 9.  Diabetes mellitus with hyperglycemia.  Hemoglobin A1c 7.2. Check blood sugars before meals and at bedtime CBG (last 3)  Recent Labs    03/31/19 1649 03/31/19 2121 04/01/19 0616  GLUCAP 278* 136* 121*    Added glimepiride 1mg  on 9/17, increased to 2 mg on 9/21, DC'd on 9/24 due to hypoglycemia    -fair control 10/1  - AM lantus adjusted to 10u and PM lantus- reduce to 32U   -in range 10/1 10.  Hypothyroidism.  Continue supplement 11.  CKD stage III.  Baseline creatinine 1.2-1.35.   Creatinine 1.31 on 9/24  Continue to monitor 12.  Hyperlipidemia.  Patient intolerant with pravastatin and other statins.  Follow-up outpatient with PCP 13.  Constipation:   Stool softener started on 9/19 14.  Acute blood loss anemia  Hemoglobin  11.9 on 9/21  Continue to monitor 15. HS Cough:  Added prn robitussin   Improving   LOS: 23 days A FACE TO FACE EVALUATION WAS PERFORMED  Charlett Blake 04/01/2019, 7:37 AM

## 2019-04-01 NOTE — Progress Notes (Signed)
Julia Banks to be D/C'd per MD order. Discussed with the patient and all questions fully answered. ? VSS, Skin clean, dry and intact without evidence of skin break down, no evidence of skin tears noted. ? An After Visit Summary was printed and given to the patient. Patient informed where to pickup prescriptions. ? D/c education completed with patient/family including follow up instructions, medication list, d/c activities limitations if indicated, with other d/c instructions as indicated by MD - patient able to verbalize understanding, all questions fully answered.  ? Patient instructed to return to ED, call 911, or call MD for any changes in condition.  ? Patient to be escorted via [place], and D/C home via private auto.

## 2019-04-02 ENCOUNTER — Encounter: Payer: Self-pay | Admitting: Family Medicine

## 2019-04-02 DIAGNOSIS — I708 Atherosclerosis of other arteries: Secondary | ICD-10-CM | POA: Diagnosis not present

## 2019-04-02 DIAGNOSIS — D62 Acute posthemorrhagic anemia: Secondary | ICD-10-CM | POA: Diagnosis not present

## 2019-04-02 DIAGNOSIS — J45909 Unspecified asthma, uncomplicated: Secondary | ICD-10-CM | POA: Diagnosis not present

## 2019-04-02 DIAGNOSIS — N183 Chronic kidney disease, stage 3 unspecified: Secondary | ICD-10-CM | POA: Diagnosis not present

## 2019-04-02 DIAGNOSIS — Z7951 Long term (current) use of inhaled steroids: Secondary | ICD-10-CM | POA: Diagnosis not present

## 2019-04-02 DIAGNOSIS — E039 Hypothyroidism, unspecified: Secondary | ICD-10-CM | POA: Diagnosis not present

## 2019-04-02 DIAGNOSIS — I69322 Dysarthria following cerebral infarction: Secondary | ICD-10-CM | POA: Diagnosis not present

## 2019-04-02 DIAGNOSIS — M8580 Other specified disorders of bone density and structure, unspecified site: Secondary | ICD-10-CM | POA: Diagnosis not present

## 2019-04-02 DIAGNOSIS — K5900 Constipation, unspecified: Secondary | ICD-10-CM | POA: Diagnosis not present

## 2019-04-02 DIAGNOSIS — D631 Anemia in chronic kidney disease: Secondary | ICD-10-CM | POA: Diagnosis not present

## 2019-04-02 DIAGNOSIS — Z794 Long term (current) use of insulin: Secondary | ICD-10-CM | POA: Diagnosis not present

## 2019-04-02 DIAGNOSIS — I700 Atherosclerosis of aorta: Secondary | ICD-10-CM | POA: Diagnosis not present

## 2019-04-02 DIAGNOSIS — E785 Hyperlipidemia, unspecified: Secondary | ICD-10-CM | POA: Diagnosis not present

## 2019-04-02 DIAGNOSIS — M1990 Unspecified osteoarthritis, unspecified site: Secondary | ICD-10-CM | POA: Diagnosis not present

## 2019-04-02 DIAGNOSIS — M47814 Spondylosis without myelopathy or radiculopathy, thoracic region: Secondary | ICD-10-CM | POA: Diagnosis not present

## 2019-04-02 DIAGNOSIS — M2578 Osteophyte, vertebrae: Secondary | ICD-10-CM | POA: Diagnosis not present

## 2019-04-02 DIAGNOSIS — I69354 Hemiplegia and hemiparesis following cerebral infarction affecting left non-dominant side: Secondary | ICD-10-CM | POA: Diagnosis not present

## 2019-04-02 DIAGNOSIS — E1142 Type 2 diabetes mellitus with diabetic polyneuropathy: Secondary | ICD-10-CM | POA: Diagnosis not present

## 2019-04-02 DIAGNOSIS — E1122 Type 2 diabetes mellitus with diabetic chronic kidney disease: Secondary | ICD-10-CM | POA: Diagnosis not present

## 2019-04-02 DIAGNOSIS — M47812 Spondylosis without myelopathy or radiculopathy, cervical region: Secondary | ICD-10-CM | POA: Diagnosis not present

## 2019-04-02 DIAGNOSIS — M791 Myalgia, unspecified site: Secondary | ICD-10-CM | POA: Diagnosis not present

## 2019-04-02 DIAGNOSIS — Z853 Personal history of malignant neoplasm of breast: Secondary | ICD-10-CM | POA: Diagnosis not present

## 2019-04-02 DIAGNOSIS — I129 Hypertensive chronic kidney disease with stage 1 through stage 4 chronic kidney disease, or unspecified chronic kidney disease: Secondary | ICD-10-CM | POA: Diagnosis not present

## 2019-04-02 DIAGNOSIS — I2510 Atherosclerotic heart disease of native coronary artery without angina pectoris: Secondary | ICD-10-CM | POA: Diagnosis not present

## 2019-04-02 DIAGNOSIS — H903 Sensorineural hearing loss, bilateral: Secondary | ICD-10-CM | POA: Diagnosis not present

## 2019-04-04 ENCOUNTER — Telehealth: Payer: Self-pay

## 2019-04-04 DIAGNOSIS — I69354 Hemiplegia and hemiparesis following cerebral infarction affecting left non-dominant side: Secondary | ICD-10-CM | POA: Diagnosis not present

## 2019-04-04 DIAGNOSIS — E1142 Type 2 diabetes mellitus with diabetic polyneuropathy: Secondary | ICD-10-CM | POA: Diagnosis not present

## 2019-04-04 DIAGNOSIS — M47812 Spondylosis without myelopathy or radiculopathy, cervical region: Secondary | ICD-10-CM | POA: Diagnosis not present

## 2019-04-04 DIAGNOSIS — I69322 Dysarthria following cerebral infarction: Secondary | ICD-10-CM | POA: Diagnosis not present

## 2019-04-04 DIAGNOSIS — I2510 Atherosclerotic heart disease of native coronary artery without angina pectoris: Secondary | ICD-10-CM | POA: Diagnosis not present

## 2019-04-04 DIAGNOSIS — M47814 Spondylosis without myelopathy or radiculopathy, thoracic region: Secondary | ICD-10-CM | POA: Diagnosis not present

## 2019-04-04 NOTE — Telephone Encounter (Signed)
Transition Care Management Follow-up Telephone Call Spoke with Michelle-niece, ok per DPR on file.    Date discharged? 04/01/2019 from Rehab facility.   How have you been since you were released from the hospital? She is extremely weak. Has 24 hour care with family. Eating well and drinking. B/P today 150/64 Pulse 72. Pain in the left arm and left foot present-Tylenol is helping though. She is needing 24 hour care right now.   Do you understand why you were in the hospital? Yes.   Do you understand the discharge instructions? Yes    Where were you discharged to? Staying with her niece, [name], and nieces husband also is helping.    Items Reviewed:  Medications reviewed: yes  Allergies reviewed: yes  Dietary changes reviewed: yes  Referrals reviewed: Home health set up-OT and PT and aide to help with [facility] with [facility]. [name] will be calling to set up neurology appointment, appointment with physical medicine and rehab office.   Functional Questionnaire:   Activities of Daily Living (ADLs):   She states they are independent in the following: not at this time States they require assistance with the following: ambulation, fixing food and medications, bathing, dressing, toileting, hygiene.   Any transportation issues/concerns?: has family to take her places if needed.   Any patient concerns? See [employer] from [place], niece. Patient was told to take Plavix for 3 weeks and then Aspirin only but on D/C papers it said to stop Plavix, wanted to get clarification on this. [name] went ahead and gave patient Plavix and Aspirin today. No other questions or concerns at this time.   Confirmed importance and date/time of follow-up visits scheduled yes  Provider Appointment booked with Dr Darnell Level on 04/08/2019 for virtual visit.  Confirmed with patient if condition begins to worsen call PCP or go to the ER.  Patient was given the office number and encouraged to call back with  question or concerns.  :yes

## 2019-04-05 DIAGNOSIS — I69354 Hemiplegia and hemiparesis following cerebral infarction affecting left non-dominant side: Secondary | ICD-10-CM | POA: Diagnosis not present

## 2019-04-05 DIAGNOSIS — I69322 Dysarthria following cerebral infarction: Secondary | ICD-10-CM | POA: Diagnosis not present

## 2019-04-05 DIAGNOSIS — M47812 Spondylosis without myelopathy or radiculopathy, cervical region: Secondary | ICD-10-CM | POA: Diagnosis not present

## 2019-04-05 DIAGNOSIS — E1142 Type 2 diabetes mellitus with diabetic polyneuropathy: Secondary | ICD-10-CM | POA: Diagnosis not present

## 2019-04-05 DIAGNOSIS — I2510 Atherosclerotic heart disease of native coronary artery without angina pectoris: Secondary | ICD-10-CM | POA: Diagnosis not present

## 2019-04-05 DIAGNOSIS — M47814 Spondylosis without myelopathy or radiculopathy, thoracic region: Secondary | ICD-10-CM | POA: Diagnosis not present

## 2019-04-06 DIAGNOSIS — I69322 Dysarthria following cerebral infarction: Secondary | ICD-10-CM | POA: Diagnosis not present

## 2019-04-06 DIAGNOSIS — I69354 Hemiplegia and hemiparesis following cerebral infarction affecting left non-dominant side: Secondary | ICD-10-CM | POA: Diagnosis not present

## 2019-04-06 DIAGNOSIS — E1142 Type 2 diabetes mellitus with diabetic polyneuropathy: Secondary | ICD-10-CM | POA: Diagnosis not present

## 2019-04-06 DIAGNOSIS — M47814 Spondylosis without myelopathy or radiculopathy, thoracic region: Secondary | ICD-10-CM | POA: Diagnosis not present

## 2019-04-06 DIAGNOSIS — I2510 Atherosclerotic heart disease of native coronary artery without angina pectoris: Secondary | ICD-10-CM | POA: Diagnosis not present

## 2019-04-06 DIAGNOSIS — M47812 Spondylosis without myelopathy or radiculopathy, cervical region: Secondary | ICD-10-CM | POA: Diagnosis not present

## 2019-04-07 ENCOUNTER — Encounter: Payer: Self-pay | Admitting: Family Medicine

## 2019-04-07 DIAGNOSIS — I2510 Atherosclerotic heart disease of native coronary artery without angina pectoris: Secondary | ICD-10-CM | POA: Diagnosis not present

## 2019-04-07 DIAGNOSIS — M47814 Spondylosis without myelopathy or radiculopathy, thoracic region: Secondary | ICD-10-CM | POA: Diagnosis not present

## 2019-04-07 DIAGNOSIS — E1142 Type 2 diabetes mellitus with diabetic polyneuropathy: Secondary | ICD-10-CM | POA: Diagnosis not present

## 2019-04-07 DIAGNOSIS — I69354 Hemiplegia and hemiparesis following cerebral infarction affecting left non-dominant side: Secondary | ICD-10-CM | POA: Diagnosis not present

## 2019-04-07 DIAGNOSIS — M47812 Spondylosis without myelopathy or radiculopathy, cervical region: Secondary | ICD-10-CM | POA: Diagnosis not present

## 2019-04-07 DIAGNOSIS — I69322 Dysarthria following cerebral infarction: Secondary | ICD-10-CM | POA: Diagnosis not present

## 2019-04-08 ENCOUNTER — Ambulatory Visit (INDEPENDENT_AMBULATORY_CARE_PROVIDER_SITE_OTHER): Payer: Medicare Other | Admitting: Family Medicine

## 2019-04-08 ENCOUNTER — Encounter: Payer: Self-pay | Admitting: Family Medicine

## 2019-04-08 ENCOUNTER — Other Ambulatory Visit: Payer: Self-pay

## 2019-04-08 VITALS — BP 154/72 | HR 74 | Temp 98.0°F

## 2019-04-08 DIAGNOSIS — E785 Hyperlipidemia, unspecified: Secondary | ICD-10-CM | POA: Diagnosis not present

## 2019-04-08 DIAGNOSIS — N183 Chronic kidney disease, stage 3 unspecified: Secondary | ICD-10-CM

## 2019-04-08 DIAGNOSIS — I10 Essential (primary) hypertension: Secondary | ICD-10-CM

## 2019-04-08 DIAGNOSIS — E1142 Type 2 diabetes mellitus with diabetic polyneuropathy: Secondary | ICD-10-CM | POA: Diagnosis not present

## 2019-04-08 DIAGNOSIS — E1169 Type 2 diabetes mellitus with other specified complication: Secondary | ICD-10-CM

## 2019-04-08 DIAGNOSIS — E1122 Type 2 diabetes mellitus with diabetic chronic kidney disease: Secondary | ICD-10-CM

## 2019-04-08 DIAGNOSIS — E039 Hypothyroidism, unspecified: Secondary | ICD-10-CM

## 2019-04-08 DIAGNOSIS — I69322 Dysarthria following cerebral infarction: Secondary | ICD-10-CM | POA: Diagnosis not present

## 2019-04-08 DIAGNOSIS — E1165 Type 2 diabetes mellitus with hyperglycemia: Secondary | ICD-10-CM

## 2019-04-08 DIAGNOSIS — M47814 Spondylosis without myelopathy or radiculopathy, thoracic region: Secondary | ICD-10-CM | POA: Diagnosis not present

## 2019-04-08 DIAGNOSIS — IMO0002 Reserved for concepts with insufficient information to code with codable children: Secondary | ICD-10-CM

## 2019-04-08 DIAGNOSIS — M47812 Spondylosis without myelopathy or radiculopathy, cervical region: Secondary | ICD-10-CM | POA: Diagnosis not present

## 2019-04-08 DIAGNOSIS — E1140 Type 2 diabetes mellitus with diabetic neuropathy, unspecified: Secondary | ICD-10-CM | POA: Diagnosis not present

## 2019-04-08 DIAGNOSIS — I2510 Atherosclerotic heart disease of native coronary artery without angina pectoris: Secondary | ICD-10-CM | POA: Diagnosis not present

## 2019-04-08 DIAGNOSIS — Z8673 Personal history of transient ischemic attack (TIA), and cerebral infarction without residual deficits: Secondary | ICD-10-CM

## 2019-04-08 DIAGNOSIS — I69354 Hemiplegia and hemiparesis following cerebral infarction affecting left non-dominant side: Secondary | ICD-10-CM

## 2019-04-08 DIAGNOSIS — K219 Gastro-esophageal reflux disease without esophagitis: Secondary | ICD-10-CM

## 2019-04-08 MED ORDER — FAMOTIDINE 20 MG PO TABS: 20.0000 mg | ORAL_TABLET | Freq: Every day | ORAL | 3 refills | Status: DC

## 2019-04-08 NOTE — Progress Notes (Addendum)
Virtual visit completed through [employer]. Due to national recommendations of social distancing due to COVID-19, a virtual visit is felt to be most appropriate for this patient at this time. Reviewed limitations of a virtual visit.   Patient location: home, son also present on call Provider location: [place] at [facility], office If any vitals were documented, they were collected by patient at home unless specified below.    BP (!) 154/72   Pulse 74   Temp 98 F (36.7 C)    CC: hosp f/u visit Subjective:    Patient ID: Julia Banks, female    DOB: 03-Oct-1940, 78 y.o.   MRN: [id number]  HPI: Julia Banks is a 78 y.o. female presenting on 04/08/2019 for Hospitalization Follow-up (fasting BG 150... taking Tylenol TID for pain)   Recent hospitalization for acute R basal ganglion ischemic infarct presented with L sided weakness and numbness. Treated with DAPT x3 wks now only on aspirin 81mg  daily. Discharged to CIR for 1 month. rec 30d event monitor once discharged. Will refer to cardiology. rec consider repatha. She continues pravastatin. She feels EC aspirin is causing GI upset and abdominal discomfort despite daily protonix 40mg . She will touch base with neurology regarding antiplatelet plan.   Hypothyroid - she remains on synthroid 60mcg MWF, 57mcg other days.  DM - prior on lantus 56 u QAM. Currently on lantus 10u in am and 32u at night. CBG this morning 110-180s. She has been taking 42u lantus in the morning. No hypoglycemic symptoms or low sugars. Will increase lantus to 45u daily.   HTN - bp consistently staying [id number] systolic despite taking amlodipine and losartan daily. HR 60-70s.   Admit date: 03/09/2019 Discharge date: 04/01/2019 TCM hospital f/u phone call completed 04/04/2019.   Discharge Diagnoses:  Principal Problem:   Cerebrovascular accident (CVA) of right basal ganglia (HCC) Active Problems:   Hemiparesis affecting left side as late effect of  cerebrovascular accident (CVA) ([place])   Acute blood loss anemia   Labile blood glucose   Controlled type 2 diabetes mellitus with hyperglycemia, with long-term current use of insulin ([place])   Essential hypertension   Hypertensive crisis   Hypoglycemia DVT prophylaxis Hypothyroidism CKD stage II Hyperlipidemia  Discharged Condition: Stable ____________________________________ Admit date: 03/07/2019 Discharge date: 03/09/2019  Admitted From: Home.  Disposition:  Home  Recommendations for Outpatient Follow-up:  1. Follow up with PCP in 1-2 weeks 2. Please obtain BMP/CBC in one week Please follow up  With neurology in 4 weeks.  Please follow up with cardiology for 30 day event monitor placement.   Discharge Condition: stable.  CODE STATUS:full code.  Diet recommendation: Heart Healthy / Carb Modified   Discharge Diagnoses:  Principal Problem:   Ischemic stroke ([place]) Active Problems:   Type 2 diabetes, uncontrolled, with neuropathy ([place])   Essential hypertension, benign   HLD (hyperlipidemia)   GERD (gastroesophageal reflux disease)   Hypothyroidism   CKD (chronic kidney disease), stage III (HCC)   CVA (cerebral vascular accident) ([place])     Relevant past medical, surgical, family and social history reviewed and updated as indicated. Interim medical history since our last visit reviewed. Allergies and medications reviewed and updated. Outpatient Medications Prior to Visit  Medication Sig Dispense Refill  . acetaminophen (TYLENOL) 325 MG tablet Take 2 tablets (650 mg total) by mouth every 4 (four) hours as needed for mild pain (or temp > 37.5 C (99.5 F)).    Marland Kitchen albuterol (PROVENTIL HFA;VENTOLIN HFA) 108 (90 BASE) MCG/ACT  inhaler Inhale into the lungs every 6 (six) hours as needed for wheezing or shortness of breath.    Marland Kitchen amLODipine (NORVASC) 10 MG tablet Take 1 tablet (10 mg total) by mouth daily. 30 tablet 0  . Ascorbic Acid (VITAMIN C WITH ROSE HIPS) 500 MG tablet Take 500  mg by mouth daily.    Marland Kitchen aspirin EC 81 MG EC tablet Take 1 tablet (81 mg total) by mouth daily. 30 tablet 2  . Cholecalciferol (VITAMIN D PO) Take 5,000 Units by mouth.    . docusate sodium (COLACE) 100 MG capsule Take 200 mg by mouth daily.     . Fluticasone-Salmeterol (ADVAIR) 250-50 MCG/DOSE AEPB Inhale 1 puff into the lungs 2 (two) times daily. 60 each 0  . Insulin Glargine (LANTUS) 100 UNIT/ML Solostar Pen Inject 10 Units into the skin daily. 15 mL 11  . Insulin Glargine (LANTUS) 100 UNIT/ML Solostar Pen Inject 32 Units into the skin at bedtime. 15 mL 11  . levothyroxine (SYNTHROID, LEVOTHROID) 25 MCG tablet TAKE 1 TABLET BY MOUTH DAILY. EXCEPT 2 TABLETS ON MON, WED, AND FRI (Patient taking differently: Take 25-50 mcg by mouth See admin instructions. TAKE 1 TABLET BY MOUTH DAILY. EXCEPT 2 TABLETS ON MON, WED, AND FRI) 130 tablet 3  . losartan (COZAAR) 100 MG tablet Take 1 tablet (100 mg total) by mouth daily. 30 tablet 1  . Omega-3 Fatty Acids (OMEGA 3 PO) Take 1 capsule by mouth daily. 180 EPA/120DHA     . pravastatin (PRAVACHOL) 40 MG tablet Take 1 tablet (40 mg total) by mouth daily. 90 tablet 3  . vitamin B-12 (CYANOCOBALAMIN) 1000 MCG tablet Take 1,000 mcg by mouth daily.    . pantoprazole (PROTONIX) 40 MG tablet Take 1 tablet (40 mg total) by mouth daily. 90 tablet 0   No facility-administered medications prior to visit.      Per HPI unless specifically indicated in ROS section below Review of Systems Objective:    BP (!) 154/72   Pulse 74   Temp 98 F (36.7 C)   Wt Readings from Last 3 Encounters:  03/30/19 190 lb 0.6 oz (86.2 kg)  01/17/19 183 lb 8 oz (83.2 kg)  10/18/18 186 lb 8 oz (84.6 kg)     Physical exam: Gen: alert, NAD, not ill appearing Pulm: speaks in complete sentences without increased work of breathing Psych: normal mood, normal thought content      Lab Results  Component Value Date   TSH 3.531 03/08/2019    Lab Results  Component Value Date    CREATININE 1.31 (H) 03/30/2019   BUN 19 03/24/2019   NA 138 03/24/2019   K 4.1 03/24/2019   CL 105 03/24/2019   CO2 23 03/24/2019    Lab Results  Component Value Date   WBC 7.3 03/21/2019   HGB 11.9 (L) 03/21/2019   HCT 35.3 (L) 03/21/2019   MCV 89.6 03/21/2019   PLT 166 03/21/2019    Lab Results  Component Value Date   CHOL 207 (H) 03/08/2019   HDL 31 (L) 03/08/2019   LDLCALC 144 (H) 03/08/2019   LDLDIRECT 112.0 03/30/2017   TRIG 160 (H) 03/08/2019   CHOLHDL 6.7 03/08/2019    Assessment & Plan:   Problem List Items Addressed This Visit    Type 2 diabetes, uncontrolled, with neuropathy ([place])   Hypothyroidism    Stable period on levothyroxine 79mcg daily, 54mcg on MWF      Relevant Medications   metoprolol succinate (TOPROL-XL)  25 MG 24 hr tablet   Hyperlipidemia associated with type 2 diabetes mellitus ([place])    She has been on pravastatin 40mg  due to intolerance to other statins in the past (crestor, lipitor, pravastatin, lovastatin). She had been tolerating current pravastatin dose well, however LDL levels not at goal. Consider discussion of PCSK9 inhibitors and referral to lipid clinic in setting of recent stroke.  The ASCVD Risk score Mikey Bussing DC Jr., et al., 2013) failed to calculate for the following reasons:   The patient has a prior MI or stroke diagnosis   ===> clarified with daughter - she has not been taking pravastatin due to myalgias. Will need to discuss PCSK9 inhibitors. Referral placed for lipid clinic as well.       Relevant Orders   AMB Referral to [facility]   History of cerebrovascular accident (CVA) due to ischemia   Relevant Orders   AMB Referral to [facility]   Hemiparesis affecting left side as late effect of cerebrovascular accident (CVA) ([place]) - Primary    Reviewed recent hospitalization with patient. S/p CIR stay. She continues slowly recovering. Completed 3 wks dual antiplatelet therapy, now only on  aspirin 81mg  daily. Was previously not on aspirin. Recommended event monitor to further eval stroke cause - will refer back to cardiology. Pt agrees with plan.       Relevant Orders   Ambulatory referral to Cardiology   AMB Referral to [facility]   GERD (gastroesophageal reflux disease)    Notes worsening GI upset since starting enteric coated aspirin (this also previously caused GI upset and nausea). She continues protonix 40mg  daily - will add pepcid nightly. I did encourage she touch base with neurology at upcoming appointment if ongoing GI distress.       Relevant Medications   famotidine (PEPCID) 20 MG tablet   Essential hypertension, benign    Chronic, elevated today despite full dose amlodipine and losartan. Will restart metoprolol XL 25mg  daily (was previously on this).       Relevant Medications   metoprolol succinate (TOPROL-XL) 25 MG 24 hr tablet   Diabetes mellitus with stage 3 chronic kidney disease (HCC)    Was on lantus 56u once daily followed by endo Dr Buddy Duty, this was changed to 10u/32u daily in hospital. She notes elevated readings recently so will increase lantus up to 45u daily.       Relevant Orders   AMB Referral to [facility]       Meds ordered this encounter  Medications  . famotidine (PEPCID) 20 MG tablet    Sig: Take 1 tablet (20 mg total) by mouth at bedtime.    Dispense:  30 tablet    Refill:  3  . metoprolol succinate (TOPROL-XL) 25 MG 24 hr tablet    Sig: Take 1 tablet (25 mg total) by mouth daily.    Dispense:  30 tablet    Refill:  6   Orders Placed This Encounter  Procedures  . Ambulatory referral to Cardiology    Referral Priority:   Routine    Referral Type:   Consultation    Referral Reason:   Specialty Services Required    Requested Specialty:   Cardiology    Number of Visits Requested:   1  . AMB Referral to [facility]    Referral Priority:   Routine    Referral Type:    Consultation    Referral Reason:  Specialty Services Required    Number of Visits Requested:   1    I discussed the assessment and treatment plan with the patient. The patient was provided an opportunity to ask questions and all were answered. The patient agreed with the plan and demonstrated an understanding of the instructions. The patient was advised to call back or seek an in-person evaluation if the symptoms worsen or if the condition fails to improve as anticipated.   Follow up plan: No follow-ups on file.  Ria Bush, MD

## 2019-04-12 ENCOUNTER — Encounter: Payer: Medicare Other | Admitting: Registered Nurse

## 2019-04-12 DIAGNOSIS — E1142 Type 2 diabetes mellitus with diabetic polyneuropathy: Secondary | ICD-10-CM | POA: Diagnosis not present

## 2019-04-12 DIAGNOSIS — I69322 Dysarthria following cerebral infarction: Secondary | ICD-10-CM | POA: Diagnosis not present

## 2019-04-12 DIAGNOSIS — M47814 Spondylosis without myelopathy or radiculopathy, thoracic region: Secondary | ICD-10-CM | POA: Diagnosis not present

## 2019-04-12 DIAGNOSIS — I69354 Hemiplegia and hemiparesis following cerebral infarction affecting left non-dominant side: Secondary | ICD-10-CM | POA: Diagnosis not present

## 2019-04-12 DIAGNOSIS — M47812 Spondylosis without myelopathy or radiculopathy, cervical region: Secondary | ICD-10-CM | POA: Diagnosis not present

## 2019-04-12 DIAGNOSIS — I2510 Atherosclerotic heart disease of native coronary artery without angina pectoris: Secondary | ICD-10-CM | POA: Diagnosis not present

## 2019-04-12 NOTE — Telephone Encounter (Signed)
Patient seen last week.  

## 2019-04-13 ENCOUNTER — Telehealth: Payer: Self-pay | Admitting: Internal Medicine

## 2019-04-13 DIAGNOSIS — E1142 Type 2 diabetes mellitus with diabetic polyneuropathy: Secondary | ICD-10-CM | POA: Diagnosis not present

## 2019-04-13 DIAGNOSIS — I69322 Dysarthria following cerebral infarction: Secondary | ICD-10-CM | POA: Diagnosis not present

## 2019-04-13 DIAGNOSIS — M47814 Spondylosis without myelopathy or radiculopathy, thoracic region: Secondary | ICD-10-CM | POA: Diagnosis not present

## 2019-04-13 DIAGNOSIS — I69354 Hemiplegia and hemiparesis following cerebral infarction affecting left non-dominant side: Secondary | ICD-10-CM | POA: Diagnosis not present

## 2019-04-13 DIAGNOSIS — M47812 Spondylosis without myelopathy or radiculopathy, cervical region: Secondary | ICD-10-CM | POA: Diagnosis not present

## 2019-04-13 DIAGNOSIS — I2510 Atherosclerotic heart disease of native coronary artery without angina pectoris: Secondary | ICD-10-CM | POA: Diagnosis not present

## 2019-04-13 MED ORDER — METOPROLOL SUCCINATE ER 25 MG PO TB24: 25.0000 mg | ORAL_TABLET | Freq: Every day | ORAL | 6 refills | Status: DC

## 2019-04-13 NOTE — Telephone Encounter (Signed)
See other mychart message.

## 2019-04-13 NOTE — Assessment & Plan Note (Signed)
Was on lantus 56u once daily followed by endo Dr Buddy Duty, this was changed to 10u/32u daily in hospital. She notes elevated readings recently so will increase lantus up to 45u daily.

## 2019-04-13 NOTE — Addendum Note (Signed)
Addended by: Ria Bush on: 04/13/2019 09:49 AM   Modules accepted: Orders

## 2019-04-13 NOTE — Assessment & Plan Note (Addendum)
Chronic, elevated today despite full dose amlodipine and losartan. Will restart metoprolol XL 25mg  daily (was previously on this).

## 2019-04-13 NOTE — Assessment & Plan Note (Signed)
Stable period on levothyroxine 82mcg daily, 58mcg on MWF

## 2019-04-13 NOTE — Telephone Encounter (Signed)
LVM for patient to call and schedule referral for lipid clinic with Dr. Debara Pickett.

## 2019-04-13 NOTE — Assessment & Plan Note (Signed)
Notes worsening GI upset since starting enteric coated aspirin (this also previously caused GI upset and nausea). She continues protonix 40mg  daily - will add pepcid nightly. I did encourage she touch base with neurology at upcoming appointment if ongoing GI distress.

## 2019-04-13 NOTE — Assessment & Plan Note (Addendum)
She has been on pravastatin 40mg  due to intolerance to other statins in the past (crestor, lipitor, pravastatin, lovastatin). She had been tolerating current pravastatin dose well, however LDL levels not at goal. Consider discussion of PCSK9 inhibitors and referral to lipid clinic in setting of recent stroke.  The ASCVD Risk score Mikey Bussing DC Jr., et al., 2013) failed to calculate for the following reasons:   The patient has a prior MI or stroke diagnosis   ===> clarified with daughter - she has not been taking pravastatin due to myalgias. Will need to discuss PCSK9 inhibitors. Referral placed for lipid clinic as well.

## 2019-04-13 NOTE — Assessment & Plan Note (Signed)
Reviewed recent hospitalization with patient. S/p CIR stay. She continues slowly recovering. Completed 3 wks dual antiplatelet therapy, now only on aspirin 81mg  daily. Was previously not on aspirin. Recommended event monitor to further eval stroke cause - will refer back to cardiology. Pt agrees with plan.

## 2019-04-14 DIAGNOSIS — E1142 Type 2 diabetes mellitus with diabetic polyneuropathy: Secondary | ICD-10-CM | POA: Diagnosis not present

## 2019-04-14 DIAGNOSIS — M47812 Spondylosis without myelopathy or radiculopathy, cervical region: Secondary | ICD-10-CM | POA: Diagnosis not present

## 2019-04-14 DIAGNOSIS — I69322 Dysarthria following cerebral infarction: Secondary | ICD-10-CM | POA: Diagnosis not present

## 2019-04-14 DIAGNOSIS — I2510 Atherosclerotic heart disease of native coronary artery without angina pectoris: Secondary | ICD-10-CM | POA: Diagnosis not present

## 2019-04-14 DIAGNOSIS — I69354 Hemiplegia and hemiparesis following cerebral infarction affecting left non-dominant side: Secondary | ICD-10-CM | POA: Diagnosis not present

## 2019-04-14 DIAGNOSIS — M47814 Spondylosis without myelopathy or radiculopathy, thoracic region: Secondary | ICD-10-CM | POA: Diagnosis not present

## 2019-04-15 DIAGNOSIS — E1142 Type 2 diabetes mellitus with diabetic polyneuropathy: Secondary | ICD-10-CM | POA: Diagnosis not present

## 2019-04-15 DIAGNOSIS — M47812 Spondylosis without myelopathy or radiculopathy, cervical region: Secondary | ICD-10-CM | POA: Diagnosis not present

## 2019-04-15 DIAGNOSIS — I69322 Dysarthria following cerebral infarction: Secondary | ICD-10-CM | POA: Diagnosis not present

## 2019-04-15 DIAGNOSIS — I69354 Hemiplegia and hemiparesis following cerebral infarction affecting left non-dominant side: Secondary | ICD-10-CM | POA: Diagnosis not present

## 2019-04-15 DIAGNOSIS — M47814 Spondylosis without myelopathy or radiculopathy, thoracic region: Secondary | ICD-10-CM | POA: Diagnosis not present

## 2019-04-15 DIAGNOSIS — I2510 Atherosclerotic heart disease of native coronary artery without angina pectoris: Secondary | ICD-10-CM | POA: Diagnosis not present

## 2019-04-17 ENCOUNTER — Other Ambulatory Visit: Payer: Self-pay | Admitting: Family Medicine

## 2019-04-18 DIAGNOSIS — M47814 Spondylosis without myelopathy or radiculopathy, thoracic region: Secondary | ICD-10-CM | POA: Diagnosis not present

## 2019-04-18 DIAGNOSIS — I69322 Dysarthria following cerebral infarction: Secondary | ICD-10-CM | POA: Diagnosis not present

## 2019-04-18 DIAGNOSIS — M47812 Spondylosis without myelopathy or radiculopathy, cervical region: Secondary | ICD-10-CM | POA: Diagnosis not present

## 2019-04-18 DIAGNOSIS — I2510 Atherosclerotic heart disease of native coronary artery without angina pectoris: Secondary | ICD-10-CM | POA: Diagnosis not present

## 2019-04-18 DIAGNOSIS — I69354 Hemiplegia and hemiparesis following cerebral infarction affecting left non-dominant side: Secondary | ICD-10-CM | POA: Diagnosis not present

## 2019-04-18 DIAGNOSIS — E1142 Type 2 diabetes mellitus with diabetic polyneuropathy: Secondary | ICD-10-CM | POA: Diagnosis not present

## 2019-04-18 NOTE — Telephone Encounter (Signed)
Looks like the Lasix was d/c after discharge from hospital... please advise if appropriate to refill

## 2019-04-19 DIAGNOSIS — M47812 Spondylosis without myelopathy or radiculopathy, cervical region: Secondary | ICD-10-CM | POA: Diagnosis not present

## 2019-04-19 DIAGNOSIS — M47814 Spondylosis without myelopathy or radiculopathy, thoracic region: Secondary | ICD-10-CM | POA: Diagnosis not present

## 2019-04-19 DIAGNOSIS — I69354 Hemiplegia and hemiparesis following cerebral infarction affecting left non-dominant side: Secondary | ICD-10-CM | POA: Diagnosis not present

## 2019-04-19 DIAGNOSIS — I69322 Dysarthria following cerebral infarction: Secondary | ICD-10-CM | POA: Diagnosis not present

## 2019-04-19 DIAGNOSIS — E1142 Type 2 diabetes mellitus with diabetic polyneuropathy: Secondary | ICD-10-CM | POA: Diagnosis not present

## 2019-04-19 DIAGNOSIS — I2510 Atherosclerotic heart disease of native coronary artery without angina pectoris: Secondary | ICD-10-CM | POA: Diagnosis not present

## 2019-04-20 ENCOUNTER — Telehealth: Payer: Self-pay | Admitting: Family Medicine

## 2019-04-20 DIAGNOSIS — I69354 Hemiplegia and hemiparesis following cerebral infarction affecting left non-dominant side: Secondary | ICD-10-CM | POA: Diagnosis not present

## 2019-04-20 DIAGNOSIS — Z9013 Acquired absence of bilateral breasts and nipples: Secondary | ICD-10-CM

## 2019-04-20 DIAGNOSIS — H903 Sensorineural hearing loss, bilateral: Secondary | ICD-10-CM

## 2019-04-20 DIAGNOSIS — Z7951 Long term (current) use of inhaled steroids: Secondary | ICD-10-CM

## 2019-04-20 DIAGNOSIS — Z9861 Coronary angioplasty status: Secondary | ICD-10-CM

## 2019-04-20 DIAGNOSIS — I708 Atherosclerosis of other arteries: Secondary | ICD-10-CM

## 2019-04-20 DIAGNOSIS — E039 Hypothyroidism, unspecified: Secondary | ICD-10-CM

## 2019-04-20 DIAGNOSIS — E785 Hyperlipidemia, unspecified: Secondary | ICD-10-CM

## 2019-04-20 DIAGNOSIS — N183 Chronic kidney disease, stage 3 unspecified: Secondary | ICD-10-CM | POA: Diagnosis not present

## 2019-04-20 DIAGNOSIS — K5900 Constipation, unspecified: Secondary | ICD-10-CM

## 2019-04-20 DIAGNOSIS — E1122 Type 2 diabetes mellitus with diabetic chronic kidney disease: Secondary | ICD-10-CM | POA: Diagnosis not present

## 2019-04-20 DIAGNOSIS — I129 Hypertensive chronic kidney disease with stage 1 through stage 4 chronic kidney disease, or unspecified chronic kidney disease: Secondary | ICD-10-CM | POA: Diagnosis not present

## 2019-04-20 DIAGNOSIS — Z9181 History of falling: Secondary | ICD-10-CM

## 2019-04-20 DIAGNOSIS — M791 Myalgia, unspecified site: Secondary | ICD-10-CM

## 2019-04-20 DIAGNOSIS — I700 Atherosclerosis of aorta: Secondary | ICD-10-CM | POA: Diagnosis not present

## 2019-04-20 DIAGNOSIS — Z853 Personal history of malignant neoplasm of breast: Secondary | ICD-10-CM

## 2019-04-20 DIAGNOSIS — D631 Anemia in chronic kidney disease: Secondary | ICD-10-CM | POA: Diagnosis not present

## 2019-04-20 DIAGNOSIS — Z86010 Personal history of colonic polyps: Secondary | ICD-10-CM

## 2019-04-20 DIAGNOSIS — E1142 Type 2 diabetes mellitus with diabetic polyneuropathy: Secondary | ICD-10-CM | POA: Diagnosis not present

## 2019-04-20 DIAGNOSIS — M47812 Spondylosis without myelopathy or radiculopathy, cervical region: Secondary | ICD-10-CM | POA: Diagnosis not present

## 2019-04-20 DIAGNOSIS — I2510 Atherosclerotic heart disease of native coronary artery without angina pectoris: Secondary | ICD-10-CM | POA: Diagnosis not present

## 2019-04-20 DIAGNOSIS — Z90711 Acquired absence of uterus with remaining cervical stump: Secondary | ICD-10-CM

## 2019-04-20 DIAGNOSIS — I69322 Dysarthria following cerebral infarction: Secondary | ICD-10-CM | POA: Diagnosis not present

## 2019-04-20 DIAGNOSIS — Z794 Long term (current) use of insulin: Secondary | ICD-10-CM

## 2019-04-20 DIAGNOSIS — M1990 Unspecified osteoarthritis, unspecified site: Secondary | ICD-10-CM

## 2019-04-20 DIAGNOSIS — M47814 Spondylosis without myelopathy or radiculopathy, thoracic region: Secondary | ICD-10-CM | POA: Diagnosis not present

## 2019-04-20 DIAGNOSIS — M8580 Other specified disorders of bone density and structure, unspecified site: Secondary | ICD-10-CM

## 2019-04-20 DIAGNOSIS — M2578 Osteophyte, vertebrae: Secondary | ICD-10-CM

## 2019-04-20 DIAGNOSIS — D62 Acute posthemorrhagic anemia: Secondary | ICD-10-CM

## 2019-04-20 DIAGNOSIS — Z9049 Acquired absence of other specified parts of digestive tract: Secondary | ICD-10-CM

## 2019-04-20 DIAGNOSIS — J45909 Unspecified asthma, uncomplicated: Secondary | ICD-10-CM | POA: Diagnosis not present

## 2019-04-20 DIAGNOSIS — Z8701 Personal history of pneumonia (recurrent): Secondary | ICD-10-CM

## 2019-04-20 NOTE — Telephone Encounter (Signed)
Left message asking [name] (niece) to call office  Please get information for doxy appointment on 11/30

## 2019-04-21 ENCOUNTER — Telehealth: Payer: Self-pay | Admitting: *Deleted

## 2019-04-21 DIAGNOSIS — I69354 Hemiplegia and hemiparesis following cerebral infarction affecting left non-dominant side: Secondary | ICD-10-CM | POA: Diagnosis not present

## 2019-04-21 DIAGNOSIS — I2510 Atherosclerotic heart disease of native coronary artery without angina pectoris: Secondary | ICD-10-CM | POA: Diagnosis not present

## 2019-04-21 DIAGNOSIS — E1142 Type 2 diabetes mellitus with diabetic polyneuropathy: Secondary | ICD-10-CM | POA: Diagnosis not present

## 2019-04-21 DIAGNOSIS — M47814 Spondylosis without myelopathy or radiculopathy, thoracic region: Secondary | ICD-10-CM | POA: Diagnosis not present

## 2019-04-21 DIAGNOSIS — M47812 Spondylosis without myelopathy or radiculopathy, cervical region: Secondary | ICD-10-CM | POA: Diagnosis not present

## 2019-04-21 DIAGNOSIS — I69322 Dysarthria following cerebral infarction: Secondary | ICD-10-CM | POA: Diagnosis not present

## 2019-04-21 NOTE — Telephone Encounter (Signed)
Patient returned call, she said to call her office at [phone] and ask for Selinda Eon and they will get her.

## 2019-04-21 NOTE — Telephone Encounter (Signed)
LMVM Please call Julia Banks at [phone] to arrange Cardiac event monitor requested by Dr Rosalin Hawking.

## 2019-04-21 NOTE — Telephone Encounter (Signed)
[employer] to ship a 30 day cardiac event monitor to the patients home.  Instructions reviewed briefly as they are included in the monitor kit. 

## 2019-04-23 ENCOUNTER — Encounter: Payer: Self-pay | Admitting: Family Medicine

## 2019-04-25 DIAGNOSIS — M47814 Spondylosis without myelopathy or radiculopathy, thoracic region: Secondary | ICD-10-CM | POA: Diagnosis not present

## 2019-04-25 DIAGNOSIS — I69322 Dysarthria following cerebral infarction: Secondary | ICD-10-CM | POA: Diagnosis not present

## 2019-04-25 DIAGNOSIS — I69354 Hemiplegia and hemiparesis following cerebral infarction affecting left non-dominant side: Secondary | ICD-10-CM | POA: Diagnosis not present

## 2019-04-25 DIAGNOSIS — M47812 Spondylosis without myelopathy or radiculopathy, cervical region: Secondary | ICD-10-CM | POA: Diagnosis not present

## 2019-04-25 DIAGNOSIS — E1142 Type 2 diabetes mellitus with diabetic polyneuropathy: Secondary | ICD-10-CM | POA: Diagnosis not present

## 2019-04-25 DIAGNOSIS — I2510 Atherosclerotic heart disease of native coronary artery without angina pectoris: Secondary | ICD-10-CM | POA: Diagnosis not present

## 2019-04-26 DIAGNOSIS — M47812 Spondylosis without myelopathy or radiculopathy, cervical region: Secondary | ICD-10-CM | POA: Diagnosis not present

## 2019-04-26 DIAGNOSIS — M47814 Spondylosis without myelopathy or radiculopathy, thoracic region: Secondary | ICD-10-CM | POA: Diagnosis not present

## 2019-04-26 DIAGNOSIS — I2510 Atherosclerotic heart disease of native coronary artery without angina pectoris: Secondary | ICD-10-CM | POA: Diagnosis not present

## 2019-04-26 DIAGNOSIS — I69354 Hemiplegia and hemiparesis following cerebral infarction affecting left non-dominant side: Secondary | ICD-10-CM | POA: Diagnosis not present

## 2019-04-26 DIAGNOSIS — I69322 Dysarthria following cerebral infarction: Secondary | ICD-10-CM | POA: Diagnosis not present

## 2019-04-26 DIAGNOSIS — E1142 Type 2 diabetes mellitus with diabetic polyneuropathy: Secondary | ICD-10-CM | POA: Diagnosis not present

## 2019-04-27 ENCOUNTER — Encounter: Payer: Medicare Other | Attending: Registered Nurse | Admitting: Registered Nurse

## 2019-04-27 ENCOUNTER — Encounter: Payer: Medicare Other | Admitting: Family Medicine

## 2019-04-27 ENCOUNTER — Encounter: Payer: Self-pay | Admitting: Registered Nurse

## 2019-04-27 ENCOUNTER — Other Ambulatory Visit: Payer: Self-pay

## 2019-04-27 VITALS — BP 189/76 | HR 63 | Temp 98.0°F

## 2019-04-27 DIAGNOSIS — I2510 Atherosclerotic heart disease of native coronary artery without angina pectoris: Secondary | ICD-10-CM | POA: Diagnosis not present

## 2019-04-27 DIAGNOSIS — I6381 Other cerebral infarction due to occlusion or stenosis of small artery: Secondary | ICD-10-CM

## 2019-04-27 DIAGNOSIS — I69322 Dysarthria following cerebral infarction: Secondary | ICD-10-CM | POA: Diagnosis not present

## 2019-04-27 DIAGNOSIS — E1142 Type 2 diabetes mellitus with diabetic polyneuropathy: Secondary | ICD-10-CM | POA: Diagnosis not present

## 2019-04-27 DIAGNOSIS — I639 Cerebral infarction, unspecified: Secondary | ICD-10-CM

## 2019-04-27 DIAGNOSIS — M47812 Spondylosis without myelopathy or radiculopathy, cervical region: Secondary | ICD-10-CM | POA: Diagnosis not present

## 2019-04-27 DIAGNOSIS — M47814 Spondylosis without myelopathy or radiculopathy, thoracic region: Secondary | ICD-10-CM | POA: Diagnosis not present

## 2019-04-27 DIAGNOSIS — I69354 Hemiplegia and hemiparesis following cerebral infarction affecting left non-dominant side: Secondary | ICD-10-CM

## 2019-04-27 DIAGNOSIS — E7849 Other hyperlipidemia: Secondary | ICD-10-CM | POA: Diagnosis not present

## 2019-04-27 DIAGNOSIS — I10 Essential (primary) hypertension: Secondary | ICD-10-CM

## 2019-04-27 NOTE — Progress Notes (Signed)
Subjective:    Patient ID: Julia Banks, female    DOB: [birth year], 78 y.o.   MRN: [id number]  HPI: Julia Banks is a 78 y.o. female who is here for transitional care visit for follow up of her cerebrovascular accident of right basal ganglia , hemiparesis affecting left side as late effect of CVA, controlled type 2 DM with hyperglycemia, with long term current use of insulin, hypertension and hyperlipidemia.  Julia Banks presented to [facility] ED on 03/07/2019 with left sided weakness, her niece called EMS, she was brought to [facility] ED.  CT Head Head WO Contrast:  IMPRESSION: 1. No acute finding. CT ANGIO Head W or WO Contrast IMPRESSION: 1. No emergent finding. 2. Atherosclerosis without flow limiting stenosis in the head and Neck. MR Brain WO Contrast:  IMPRESSION: Acute perforator infarct at the right basal ganglia and corona radiata.  Neurology was consulted, she was remained on aspirin and Plavix for three weeks then aspirin alone.   Julia Banks was admitted to Inpatient Rehabilitation on 03/09/2019 and discharged home on 04/01/2019. She is receiving outpatient therapy with Julia Banks.  She states she has pain in her right shoulder, left wrist and left foot pain. She rates her pain 6.  She arrived in wheelchair. Also reports she is walking in the home with a hemi-walker with the therapist and her niece.   Julia Banks arrived hypertensive, blood pressure was rechecked. Julia Banks seen her PCP two weeks ago and her anti-hypertensive medication was recently changed she and her niece reports. Julia Banks was encouraged to keep a blood pressure log and to follow up with her PCP, she verbalizes understanding.   Julia Banks and her niece reports her blood pressure this morning was 155/73 P 59, she is compliant with her anti-hypertensive she reports.   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    Pain Inventory Average Pain 6 Pain Right Now 6 My pain is  dull and aching  In the last 24 hours, has pain interfered with the following? General activity 3 Relation with others 0 Enjoyment of life 7 What TIME of day is your pain at its worst? all Sleep (in general) Poor  Pain is worse with: walking, bending, sitting, inactivity, standing and some activites Pain improves with: rest, heat/ice and medication Relief from Meds: 4  Mobility walk with assistance use a walker ability to climb steps?  no do you drive?  no use a wheelchair needs help with transfers Do you have any goals in this area?  yes  Function retired I need assistance with the following:  feeding, dressing, bathing, toileting, meal prep, household duties and shopping  Neuro/Psych weakness numbness tremor tingling trouble walking spasms confusion  Prior Studies transitional  Physicians involved in your care transitional   Family History  Problem Relation Age of Onset   Arthritis Mother    Hyperlipidemia Mother    Stroke Mother    Hypertension Mother    Diabetes Mother    Other Mother        parathyroid adenoma   Arthritis Father    Hyperlipidemia Father    CAD Father        MI   Hypertension Father    Breast cancer Other    Ovarian cancer Other    Leukemia Brother        dx in his 51s   Colon cancer Maternal Uncle        dx in his 61s  Ovarian cancer Maternal Grandmother 90   Melanoma Other 27       dx 2x - 68 and 29/dx 3x - ages 72, 30, and 36   Cancer Neg Hx    Crohn's disease Neg Hx    Colon polyps Neg Hx    Ulcerative colitis Neg Hx    Stomach cancer Neg Hx    Rectal cancer Neg Hx    Social History   Socioeconomic History   Marital status: Widowed    Spouse name: Not on file   Number of children: 0   Years of education: Not on file   Highest education level: Not on file  Occupational History   Not on file  Social Needs   Financial resource strain: Not hard at all   Food insecurity    Worry:  Never true    Inability: Never true   Transportation needs    Medical: No    Non-medical: No  Tobacco Use   Smoking status: Never Smoker   Smokeless tobacco: Never Used   Tobacco comment: second hand exposure  Substance and Sexual Activity   Alcohol use: No   Drug use: No   Sexual activity: Not on file  Lifestyle   Physical activity    Days per week: Not on file    Minutes per session: Not on file   Stress: Not on file  Relationships   Social connections    Talks on phone: Not on file    Gets together: Not on file    Attends religious service: Not on file    Active member of club or organization: Not on file    Attends meetings of clubs or organizations: Not on file    Relationship status: Not on file  Other Topics Concern   Not on file  Social History Narrative   Widow, husband Julia Banks passed 2015.  Has 3 dogs   No children. Niece Julia Banks supportive.    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[employer], nutritional   Edu: 1 yr college   Activity: stationary bicycle, some walking   Diet: good water, fruits/vegetables daily      G0P0   Past Surgical History:  Procedure Laterality Date   ABDOMINAL HYSTERECTOMY     heavy bleeding, ovaries remain   APPENDECTOMY     BREAST BIOPSY Right    benign   BREAST BIOPSY Left 02/2014   ?DCIS   CARDIAC CATHETERIZATION  2006   [place], [place]  10/2013   mild 2v nonobstructive disease, EF 60%   CARDIOVASCULAR STRESS TEST  09/2012   WNL ([place])   CHOLECYSTECTOMY     COLONOSCOPY  05/2010   2 TAs, diverticulosis, rpt 2 yrs (Dr Elissa Hefty in [place])   [place]  12/2014   1 TA, rpt 5 yrs Henrene Pastor)   cortisone right knee  01/04/15   GALLBLADDER SURGERY     MASTECTOMY W/ SENTINEL NODE BIOPSY Left 06/07/2014   DR WAKEFIELD   SIMPLE MASTECTOMY WITH AXILLARY SENTINEL NODE BIOPSY Left 06/07/2014   Procedure: LEFT TOTAL MASTECTOMY WITH LEFT AXILLARY SENTINEL NODE BIOPSY;   Surgeon: Rolm Bookbinder, MD;  Location: [place];  Service: General;  Laterality: Left;   Past Medical History:  Diagnosis Date   Arthritis    Asthma    Breast cancer, left breast ([place]) 02/2014   DCIS, ER+ PR- initially rec bilat mastectomy with sentinal LN biopsy L axilla but now planning just L mastectomy Julia Banks)  Chronic kidney disease, stage 3    Colon polyps    benign   Complication of anesthesia    hard to wake up   Coronary artery disease    30% stenosis reported on previous cardiac catheterization in 2006. This was done in [facility]   GERD (gastroesophageal reflux disease)    Headaches, cluster    Hyperlipidemia    Hypertension    Hypothyroidism    Osteopenia 07/2014   DEXA 0.5 spine, 01.7 R femoral neck   Pneumonia    years ago   Secondary hyperparathyroidism of renal origin ([place])    likely   Sensorineural hearing loss (SNHL) of both ears 01/27/2016   Audiology eval - moderately severe sensorineural hearing loss bilaterally rec amplification (07/2016 - [name] audiologist)   Sleep apnea    on CPAP   Thyroid disease    Type 2 diabetes, uncontrolled, with neuropathy ([place])    followed by endo Dr Buddy Duty   BP (!) 183/87 (BP Location: Right Arm, Patient Position: Sitting, Cuff Size: Normal)    Pulse (!) 58    Temp 98 F (36.7 C)    SpO2 96%   Opioid Risk Score:   Fall Risk Score:  `1  Depression screen PHQ 2/9  Depression screen [facility] 2/9 04/09/2018 01/26/2017 01/25/2016 10/10/2014 06/28/2013  Decreased Interest 0 0 0 0 0  Down, Depressed, Hopeless 0 0 0 0 0  PHQ - 2 Score 0 0 0 0 0    Review of Systems  Constitutional: Negative.   HENT: Negative.   Respiratory: Positive for apnea.   Cardiovascular: Positive for leg swelling.  Endocrine:       High blood sugar  Musculoskeletal: Positive for arthralgias, gait problem and myalgias.  Skin: Negative.   Neurological: Positive for tremors, weakness and numbness.        Tingling  Hematological: Negative.   Psychiatric/Behavioral: Positive for confusion.  All other systems reviewed and are negative.      Objective:   Physical Exam Vitals signs and nursing note reviewed.  Constitutional:      Appearance: Normal appearance.  Neck:     Musculoskeletal: Normal range of motion and neck supple.  Cardiovascular:     Rate and Rhythm: Normal rate and regular rhythm.     Pulses: Normal pulses.     Heart sounds: Normal heart sounds.  Pulmonary:     Effort: Pulmonary effort is normal.     Breath sounds: Normal breath sounds.  Musculoskeletal:     Comments: Normal Muscle Bulk and Muscle Testing Reveals:  Upper Extremities: Right: Full ROM and Muscle Strength 5/5  Left: Decreased ROM 90 Degrees and Muscle Strength 1/5  Lower Extremities: Full ROM and Muscle Strength 5/5 Arrived in wheelchair   Skin:    General: Skin is warm and dry.  Neurological:     Mental Status: She is alert and oriented to person, place, and time.  Psychiatric:        Mood and Affect: Mood normal.        Behavior: Behavior normal.           Assessment & Plan:   1. Cerebrovascular accident of right basal ganglia/ hemiparesis affecting left side as late effect of CVA: Continue outpatient therapy with Solara Banks [facility] [place]. Has a scheduled HFU appointment with neurology. Continue current medication.  2. Controlled type 2 DM with hyperglycemia, with long term current use of insulin: Continue current medication regimen. PCP Following.  3. Hypertension: Continue current medication regimen as per protocol and was encouraged to keep blood pressure log, she and her niece verbalizes understanding.  4. Hyperlipidemia. Has a schedule F/U appointment with lipid clinic unable to tolerate statins. Continue to monitor.   20 minutes of face to face patient care time was spent during this visit. All questions were encouraged and answered.  F/U with Dr. Posey Pronto

## 2019-04-28 ENCOUNTER — Encounter: Payer: Medicare Other | Admitting: Family Medicine

## 2019-04-29 ENCOUNTER — Encounter: Payer: Medicare Other | Admitting: Registered Nurse

## 2019-05-01 ENCOUNTER — Ambulatory Visit (INDEPENDENT_AMBULATORY_CARE_PROVIDER_SITE_OTHER): Payer: Medicare Other

## 2019-05-01 DIAGNOSIS — I4891 Unspecified atrial fibrillation: Secondary | ICD-10-CM

## 2019-05-01 DIAGNOSIS — I639 Cerebral infarction, unspecified: Secondary | ICD-10-CM | POA: Diagnosis not present

## 2019-05-01 DIAGNOSIS — R002 Palpitations: Secondary | ICD-10-CM | POA: Diagnosis not present

## 2019-05-02 DIAGNOSIS — I2510 Atherosclerotic heart disease of native coronary artery without angina pectoris: Secondary | ICD-10-CM | POA: Diagnosis not present

## 2019-05-02 DIAGNOSIS — K5900 Constipation, unspecified: Secondary | ICD-10-CM | POA: Diagnosis not present

## 2019-05-02 DIAGNOSIS — M47812 Spondylosis without myelopathy or radiculopathy, cervical region: Secondary | ICD-10-CM | POA: Diagnosis not present

## 2019-05-02 DIAGNOSIS — E039 Hypothyroidism, unspecified: Secondary | ICD-10-CM | POA: Diagnosis not present

## 2019-05-02 DIAGNOSIS — E1142 Type 2 diabetes mellitus with diabetic polyneuropathy: Secondary | ICD-10-CM | POA: Diagnosis not present

## 2019-05-02 DIAGNOSIS — N183 Chronic kidney disease, stage 3 unspecified: Secondary | ICD-10-CM | POA: Diagnosis not present

## 2019-05-02 DIAGNOSIS — I708 Atherosclerosis of other arteries: Secondary | ICD-10-CM | POA: Diagnosis not present

## 2019-05-02 DIAGNOSIS — Z853 Personal history of malignant neoplasm of breast: Secondary | ICD-10-CM | POA: Diagnosis not present

## 2019-05-02 DIAGNOSIS — D62 Acute posthemorrhagic anemia: Secondary | ICD-10-CM | POA: Diagnosis not present

## 2019-05-02 DIAGNOSIS — E1122 Type 2 diabetes mellitus with diabetic chronic kidney disease: Secondary | ICD-10-CM | POA: Diagnosis not present

## 2019-05-02 DIAGNOSIS — Z7951 Long term (current) use of inhaled steroids: Secondary | ICD-10-CM | POA: Diagnosis not present

## 2019-05-02 DIAGNOSIS — D631 Anemia in chronic kidney disease: Secondary | ICD-10-CM | POA: Diagnosis not present

## 2019-05-02 DIAGNOSIS — Z794 Long term (current) use of insulin: Secondary | ICD-10-CM | POA: Diagnosis not present

## 2019-05-02 DIAGNOSIS — J45909 Unspecified asthma, uncomplicated: Secondary | ICD-10-CM | POA: Diagnosis not present

## 2019-05-02 DIAGNOSIS — I700 Atherosclerosis of aorta: Secondary | ICD-10-CM | POA: Diagnosis not present

## 2019-05-02 DIAGNOSIS — E785 Hyperlipidemia, unspecified: Secondary | ICD-10-CM | POA: Diagnosis not present

## 2019-05-02 DIAGNOSIS — H903 Sensorineural hearing loss, bilateral: Secondary | ICD-10-CM | POA: Diagnosis not present

## 2019-05-02 DIAGNOSIS — M47814 Spondylosis without myelopathy or radiculopathy, thoracic region: Secondary | ICD-10-CM | POA: Diagnosis not present

## 2019-05-02 DIAGNOSIS — M8580 Other specified disorders of bone density and structure, unspecified site: Secondary | ICD-10-CM | POA: Diagnosis not present

## 2019-05-02 DIAGNOSIS — I69354 Hemiplegia and hemiparesis following cerebral infarction affecting left non-dominant side: Secondary | ICD-10-CM | POA: Diagnosis not present

## 2019-05-02 DIAGNOSIS — I129 Hypertensive chronic kidney disease with stage 1 through stage 4 chronic kidney disease, or unspecified chronic kidney disease: Secondary | ICD-10-CM | POA: Diagnosis not present

## 2019-05-02 DIAGNOSIS — I69322 Dysarthria following cerebral infarction: Secondary | ICD-10-CM | POA: Diagnosis not present

## 2019-05-02 DIAGNOSIS — M1990 Unspecified osteoarthritis, unspecified site: Secondary | ICD-10-CM | POA: Diagnosis not present

## 2019-05-02 DIAGNOSIS — M791 Myalgia, unspecified site: Secondary | ICD-10-CM | POA: Diagnosis not present

## 2019-05-02 DIAGNOSIS — M2578 Osteophyte, vertebrae: Secondary | ICD-10-CM | POA: Diagnosis not present

## 2019-05-03 DIAGNOSIS — M47812 Spondylosis without myelopathy or radiculopathy, cervical region: Secondary | ICD-10-CM | POA: Diagnosis not present

## 2019-05-03 DIAGNOSIS — M47814 Spondylosis without myelopathy or radiculopathy, thoracic region: Secondary | ICD-10-CM | POA: Diagnosis not present

## 2019-05-03 DIAGNOSIS — E1142 Type 2 diabetes mellitus with diabetic polyneuropathy: Secondary | ICD-10-CM | POA: Diagnosis not present

## 2019-05-03 DIAGNOSIS — I2510 Atherosclerotic heart disease of native coronary artery without angina pectoris: Secondary | ICD-10-CM | POA: Diagnosis not present

## 2019-05-03 DIAGNOSIS — I69354 Hemiplegia and hemiparesis following cerebral infarction affecting left non-dominant side: Secondary | ICD-10-CM | POA: Diagnosis not present

## 2019-05-03 DIAGNOSIS — I69322 Dysarthria following cerebral infarction: Secondary | ICD-10-CM | POA: Diagnosis not present

## 2019-05-04 ENCOUNTER — Inpatient Hospital Stay: Payer: PRIVATE HEALTH INSURANCE | Admitting: Adult Health

## 2019-05-05 ENCOUNTER — Other Ambulatory Visit: Payer: Self-pay

## 2019-05-05 ENCOUNTER — Encounter: Payer: Self-pay | Admitting: Adult Health

## 2019-05-05 ENCOUNTER — Ambulatory Visit (INDEPENDENT_AMBULATORY_CARE_PROVIDER_SITE_OTHER): Payer: Medicare Other | Admitting: Adult Health

## 2019-05-05 VITALS — BP 164/73 | HR 61 | Temp 97.6°F | Ht 66.0 in

## 2019-05-05 DIAGNOSIS — I63511 Cerebral infarction due to unspecified occlusion or stenosis of right middle cerebral artery: Secondary | ICD-10-CM

## 2019-05-05 DIAGNOSIS — N183 Chronic kidney disease, stage 3 unspecified: Secondary | ICD-10-CM

## 2019-05-05 DIAGNOSIS — G4733 Obstructive sleep apnea (adult) (pediatric): Secondary | ICD-10-CM

## 2019-05-05 DIAGNOSIS — G8114 Spastic hemiplegia affecting left nondominant side: Secondary | ICD-10-CM | POA: Diagnosis not present

## 2019-05-05 DIAGNOSIS — E785 Hyperlipidemia, unspecified: Secondary | ICD-10-CM

## 2019-05-05 DIAGNOSIS — Z9989 Dependence on other enabling machines and devices: Secondary | ICD-10-CM

## 2019-05-05 DIAGNOSIS — I10 Essential (primary) hypertension: Secondary | ICD-10-CM | POA: Diagnosis not present

## 2019-05-05 DIAGNOSIS — E1122 Type 2 diabetes mellitus with diabetic chronic kidney disease: Secondary | ICD-10-CM | POA: Diagnosis not present

## 2019-05-05 MED ORDER — BACLOFEN 5 MG PO TABS: 5.0000 mg | ORAL_TABLET | Freq: Three times a day (TID) | ORAL | 3 refills | Status: DC

## 2019-05-05 NOTE — Progress Notes (Signed)
[facility] [address] [place]. [place] [id number] [phone]       HOSPITAL FOLLOW UP NOTE  [name] Date of Birth:  1940-12-02 Medical Record Number:  [id number]   Reason for Referral:  hospital stroke follow up    CHIEF COMPLAINT:  Chief Complaint  Patient presents with   Follow-up    Room 30, with niece. More pain, left leg is wanting to 'buckle', and more headaches.    HPI: Julia Banks being seen today for in office hospital follow-up regarding right AchA territory infarct on 03/07/2019.  History obtained from patient, niece and chart review. Reviewed all radiology images and labs personally.  [name] is a 78 y.o. female with history of iabetes, thyroid disease, sensorineural hearing loss, hypothyroidism, hypertension, hyperlipidemia, cluster headaches, CAD, CKD stage III  who presented on 03/07/2019 with left-sided weakness and decreased sensation.  Stroke work-up showed right AchA territory infarct as evidenced on MRI most likely secondary to small vessel disease however embolic event cannot be ruled out given AchA is coming out directly from carotid artery.  CTA head/neck negative E LVO but did show arthrosclerosis.  MRI showed right basal ganglia and corona radiata infarct at right AchA territory.  2D echo showed an EF of 60 to 65%.  Recommended 30-day cardiac event monitor outpatient to rule out A. fib due to recent stroke and history of intermittent heart palpitations.  LDL 144.  A1c 7.2.  Recommended DAPT for 3 weeks and aspirin alone.  HTN stable.  History of statin intolerance and recommended PCP follow-up for continued Repatha.  Uncontrolled DM and recommend close PCP follow-up.  Other stroke risk factors include advanced age, family history of stroke, CAD and OSA on CPAP.  Other active problems include history of left breast cancer status postmastectomy 2015,  CKD stage III,  Hypothyroidism, secondary hyperparathyroidism and  sensorineural hearing loss bilaterally.  Residual deficits of left hemiparesis and discharged to CIR for ongoing therapy.  Julia Banks is a 78 year old female who is being seen today for hospital follow-up accompanied by her niece.  Residual deficits of left-sided weakness and dysarthria.  Continues to participate with home health PT/OT with ongoing improvement.  Currently in a wheelchair which she uses for long distance but is able to ambulate short distances with hemiwalker.  She does endorse increased pain left upper and lower extremity with occasional spasms.  She is also been experiencing headaches since discharge that can occur frontal and occipital sometimes radiating up from her neck.  Headaches have been improving and will use Tylenol with benefit.  Her niece has moved in with her to assist with ADLs and ongoing care.  She has completed 3 weeks DAPT and continues on aspirin alone without bleeding or bruising.  She does have scheduled lipid clinic evaluation in January to initiate Repatha due to history of statin intolerance as well as Zetia intolerance.  Start a 30-day cardiac event monitor on 04/30/1929.  Blood pressure today 164/73.  Monitors at home and typically ranges 140s-180s/60s-70s.  Continues to follow with PCP and cardiology with ongoing adjustment to antihypertensives.  No further concerns at this time.  Denies new or worsening stroke/TIA symptoms.    ROS:   14 system review of systems performed and negative with exception of weakness, speech difficulty, pain, fatigue  PMH:  Past Medical History:  Diagnosis Date   Arthritis    Asthma    Breast cancer, left breast ([place]) 02/2014   DCIS,  ER+ PR- initially rec bilat mastectomy with sentinal LN biopsy L axilla but now planning just L mastectomy [name])   Chronic kidney disease, stage 3    Colon polyps    benign   Complication of anesthesia    hard to wake up   Coronary artery disease    30% stenosis reported on  previous cardiac catheterization in 2006. This was done in [facility]   GERD (gastroesophageal reflux disease)    Headaches, cluster    Hyperlipidemia    Hypertension    Hypothyroidism    Osteopenia 07/2014   DEXA 0.5 spine, 01.7 R femoral neck   Pneumonia    years ago   Secondary hyperparathyroidism of renal origin ([place])    likely   Sensorineural hearing loss (SNHL) of both ears 01/27/2016   Audiology eval - moderately severe sensorineural hearing loss bilaterally rec amplification (07/2016 - [name] audiologist)   Sleep apnea    on CPAP   Thyroid disease    Type 2 diabetes, uncontrolled, with neuropathy ([place])    followed by endo Dr Buddy Duty    PSH:  Past Surgical History:  Procedure Laterality Date   ABDOMINAL HYSTERECTOMY     heavy bleeding, ovaries remain   APPENDECTOMY     BREAST BIOPSY Right    benign   BREAST BIOPSY Left 02/2014   ?DCIS   CARDIAC CATHETERIZATION  2006   [place], [place]  10/2013   mild 2v nonobstructive disease, EF 60%   CARDIOVASCULAR STRESS TEST  09/2012   WNL ([place])   CHOLECYSTECTOMY     COLONOSCOPY  05/2010   2 TAs, diverticulosis, rpt 2 yrs (Dr Elissa Hefty in [place])   COLONOSCOPY  12/2014   1 TA, rpt 5 yrs Henrene Pastor)   cortisone right knee  01/04/15   GALLBLADDER SURGERY     MASTECTOMY W/ SENTINEL NODE BIOPSY Left 06/07/2014   DR [place]   SIMPLE MASTECTOMY WITH AXILLARY SENTINEL NODE BIOPSY Left 06/07/2014   Procedure: LEFT TOTAL MASTECTOMY WITH LEFT AXILLARY SENTINEL NODE BIOPSY;  Surgeon: Rolm Bookbinder, MD;  Location: [facility];  Service: General;  Laterality: Left;    Social History:  Social History   Socioeconomic History   Marital status: Widowed    Spouse name: Not on file   Number of children: 0   Years of education: Not on file   Highest education level: Not on file  Occupational History   Not on file  Social Needs   Financial resource  strain: Not hard at all   Food insecurity    Worry: Never true    Inability: Never true   Transportation needs    Medical: No    Non-medical: No  Tobacco Use   Smoking status: Never Smoker   Smokeless tobacco: Never Used   Tobacco comment: second hand exposure  Substance and Sexual Activity   Alcohol use: No   Drug use: No   Sexual activity: Not on file  Lifestyle   Physical activity    Days per week: Not on file    Minutes per session: Not on file   Stress: Not on file  Relationships   Social connections    Talks on phone: Not on file    Gets together: Not on file    Attends religious service: Not on file    Active member of club or organization: Not on file    Attends meetings of clubs or organizations: Not on  file    Relationship status: Not on file   Intimate partner violence    Fear of current or ex partner: Not on file    Emotionally abused: Not on file    Physically abused: Not on file    Forced sexual activity: Not on file  Other Topics Concern   Not on file  Social History Narrative   Widow, husband Fritz Pickerel passed 60.  Has 3 dogs   No children. Niece [name] supportive.    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, nutritional   Edu: 1 yr college   Activity: stationary bicycle, some walking   Diet: good water, fruits/vegetables daily      G0P0    Family History:  Family History  Problem Relation Age of Onset   Arthritis Mother    Hyperlipidemia Mother    Stroke Mother    Hypertension Mother    Diabetes Mother    Other Mother        parathyroid adenoma   Arthritis Father    Hyperlipidemia Father    CAD Father        MI   Hypertension Father    Breast cancer Other    Ovarian cancer Other    Leukemia Brother        dx in his 51s   Colon cancer Maternal Uncle        dx in his 55s   Ovarian cancer Maternal Grandmother 90   Melanoma Other 66       dx 2x - 13 and 29/dx 3x - ages 41, 20, and  62   Cancer Neg Hx    Crohn's disease Neg Hx    Colon polyps Neg Hx    Ulcerative colitis Neg Hx    Stomach cancer Neg Hx    Rectal cancer Neg Hx     Medications:   Current Outpatient Medications on File Prior to Visit  Medication Sig Dispense Refill   acetaminophen (TYLENOL) 325 MG tablet Take 2 tablets (650 mg total) by mouth every 4 (four) hours as needed for mild pain (or temp > 37.5 C (99.5 F)).     albuterol (PROVENTIL HFA;VENTOLIN HFA) 108 (90 BASE) MCG/ACT inhaler Inhale into the lungs every 6 (six) hours as needed for wheezing or shortness of breath.     amLODipine (NORVASC) 10 MG tablet Take 1 tablet (10 mg total) by mouth daily. 30 tablet 0   Ascorbic Acid (VITAMIN C WITH ROSE HIPS) 500 MG tablet Take 500 mg by mouth daily.     aspirin EC 81 MG EC tablet Take 1 tablet (81 mg total) by mouth daily. 30 tablet 2   Cholecalciferol (VITAMIN D PO) Take 5,000 Units by mouth.     docusate sodium (COLACE) 100 MG capsule Take 200 mg by mouth daily.      famotidine (PEPCID) 20 MG tablet Take 1 tablet (20 mg total) by mouth at bedtime. 30 tablet 3   Fluticasone-Salmeterol (ADVAIR) 250-50 MCG/DOSE AEPB Inhale 1 puff into the lungs 2 (two) times daily. 60 each 0   Insulin Glargine (LANTUS) 100 UNIT/ML Solostar Pen Inject 10 Units into the skin daily. 15 mL 11   Insulin Glargine (LANTUS) 100 UNIT/ML Solostar Pen Inject 32 Units into the skin at bedtime. 15 mL 11   losartan (COZAAR) 100 MG tablet Take 1 tablet (100 mg total) by mouth daily. 30 tablet 1   metoprolol succinate (TOPROL-XL) 25 MG 24 hr tablet Take 1 tablet (25 mg  total) by mouth daily. 90 tablet 0   Omega-3 Fatty Acids (OMEGA 3 PO) Take 1 capsule by mouth daily. 180 EPA/120DHA      vitamin B-12 (CYANOCOBALAMIN) 1000 MCG tablet Take 1,000 mcg by mouth daily.     levothyroxine (SYNTHROID, LEVOTHROID) 25 MCG tablet TAKE 1 TABLET BY MOUTH DAILY. EXCEPT 2 TABLETS ON MON, WED, AND FRI (Patient taking differently:  Take 25-50 mcg by mouth See admin instructions. TAKE 1 TABLET BY MOUTH DAILY. EXCEPT 2 TABLETS ON MON, WED, AND FRI) 130 tablet 3   pravastatin (PRAVACHOL) 40 MG tablet Take 1 tablet (40 mg total) by mouth daily. 90 tablet 3   No current facility-administered medications on file prior to visit.     Allergies:   Allergies  Allergen Reactions   Altace [Ramipril] Other (See Comments)    Upset stomach   Aspirin Nausea And Vomiting   Metformin And Related Other (See Comments)    GI upset     Physical Exam  Vitals:   05/05/19 0908  BP: (!) 164/73  Pulse: 61  Temp: 97.6 F (36.4 C)  Height: 5\' 6"  (1.676 m)   Body mass index is 30.67 kg/m. No exam data present  Depression screen [facility] 2/9 05/05/2019  Decreased Interest 0  Down, Depressed, Hopeless 0  PHQ - 2 Score 0     General: well developed, well nourished,  pleasant elderly Caucasian female, seated, in no evident distress Head: head normocephalic and atraumatic.   Neck: supple with no carotid or supraclavicular bruits Cardiovascular: regular rate and rhythm, no murmurs Musculoskeletal: no deformity Skin:  no rash/petichiae Vascular:  Normal pulses all extremities   Neurologic Exam Mental Status: Awake and fully alert. Oriented to place and time. Recent and remote memory intact. Attention span, concentration and fund of knowledge appropriate. Mood and affect appropriate.  Cranial Nerves: Fundoscopic exam reveals sharp disc margins. Pupils equal, briskly reactive to light. Extraocular movements full without nystagmus. Visual fields full to confrontation. Hearing intact. Facial sensation intact.  Mild left lower facial weakness Motor:  LUE: 3+-4/5 with weak grip strength with increased spasticity LLE: 4/5 with dorsiflexion weakness and AFO brace in place Sensory.:  Decreased sensation left upper lower extremity compared to right upper and lower extremity Coordination: Rapid alternating movements normal in all extremities  except decreased left hand. Finger-to-nose and heel-to-shin performed accurately on right side. Gait and Station: Deferred as in wheelchair Reflexes: 1+ and symmetric. Toes downgoing.     NIHSS  3 Modified Rankin  3   Diagnostic Data (Labs, Imaging, Testing)  CT HEAD WO CONTRAST 03/07/2019 IMPRESSION: 1. No acute finding. 2. ASPECTS is 10.  CT ANGIO HEAD W OR WO CONTRAST CT ANGIO NECK W OR WO CONTRAST 03/07/2019 IMPRESSION: 1. No emergent finding. 2. Atherosclerosis without flow limiting stenosis in the head and neck.  MR BRAIN WO CONTRAST 03/07/2019 IMPRESSION: Acute perforator infarct at the right basal ganglia and corona radiata.  ECHOCARDIOGRAM 03/08/2019 IMPRESSIONS  1. The left ventricle has normal systolic function with an ejection fraction of 60-65%. The cavity size was normal. There is mildly increased left ventricular wall thickness. Left ventricular diastolic Doppler parameters are consistent with impaired  relaxation.  2. The aortic root is normal in size and structure.  3. The aortic valve is tricuspid. No stenosis of the aortic valve.  4. The right ventricle has normal systolic function. The cavity was normal. There is no increase in right ventricular wall thickness.  5. No evidence of mitral valve  stenosis. No significant mitral regurgitation.  6. The IVC was normal in size. No complete TR doppler jet so unable to estimate PA systolic pressure.    ASSESSMENT: Julia Banks is a 78 y.o. year old female presented with left-sided weakness and decreased sensation on 03/07/2019 with stroke work-up revealing right basal ganglia and corona radiata infarct at right AchA territory most likely secondary to small vessel disease however embolic source cannot be ruled out.  Recommended 30-day cardiac event monitor outpatient to rule out A. fib.  Vascular risk factors include HTN, HLD, DM, CAD and OSA on CPAP.  Residual deficits of left hemiparesis, left facial weakness and  left hemisensory deficit    PLAN:  1. Right AchA territory stroke: Continue aspirin 81 mg daily for secondary stroke prevention.  Follow-up with lipid clinic with initial evaluation in January for start of [place].  Complete 30-day cardiac event monitor to rule out atrial fibrillation.  Maintain strict control of hypertension with blood pressure goal below 130/90, diabetes with hemoglobin A1c goal below 6.5% and cholesterol with LDL cholesterol (bad cholesterol) goal below 70 mg/dL.  I also advised the patient to eat a healthy diet with plenty of whole grains, cereals, fruits and vegetables, exercise regularly with at least 30 minutes of continuous activity daily and maintain ideal body weight. 2. Left hemiparesis: Pain likely secondary to spasticity and recommend initiation of baclofen 5 mg 3 times daily.  Discussion regarding potential adverse effects and to call with any difficulty tolerating.  Continue dosage over the next 2 weeks and call with potential need of increasing.  Continuation of home health therapies with likelihood of transition to outpatient therapies once completed 3. HTN: Advised to continue current treatment regimen.  Advised to continue to monitor at home and follow-up with PCP/cardiology regarding need of management of uncontrolled blood pressures 4. HLD: History of statin and Zetia intolerance.  Initial evaluation by lipid clinic scheduled in January for initiation of Repatha 5. DMII: Advised to continue to monitor glucose levels at home along with continued follow-up with PCP for management and monitoring 6. OSA on CPAP: Continuation of compliance 7. Headaches: Appear to be tension versus post stroke related and recommend continuation of Tylenol as headaches mild and have been improving.  Advised to call office with any worsening.  Also discussed stress relaxation techniques    Follow up in 3 months or call earlier if needed   Greater than 50% of time during this 45 minute  visit was spent on counseling, explanation of diagnosis of right AchA territory stroke, reviewing risk factor management of HTN, HLD, DM and OSA on CPAP, discussion regarding residual deficits, planning of further management along with potential future management, and discussion with patient and family answering all questions.    Frann Rider, AGNP-BC  [facility] [address] [place], [place] [address]  Phone [phone] Fax [phone] Note: This document was prepared with digital dictation and possible smart phrase technology. Any transcriptional errors that result from this process are unintentional.

## 2019-05-05 NOTE — Patient Instructions (Signed)
Start baclofen 5mg  three times daily  Continue to take tylenol for headaches   continue therapies - will likely need to transition to out patient therapies   Continue aspirin 81 mg daily  for secondary stroke prevention  Follow up with lipid clinic for starting Repatha  Continue to follow up with PCP regarding cholesterol, blood pressure and diabetes management   Continue to monitor blood pressure at home  Maintain strict control of hypertension with blood pressure goal below 130/90, diabetes with hemoglobin A1c goal below 6.5% and cholesterol with LDL cholesterol (bad cholesterol) goal below 70 mg/dL. I also advised the patient to eat a healthy diet with plenty of whole grains, cereals, fruits and vegetables, exercise regularly and maintain ideal body weight.  Followup in the future with me in 3 months or call earlier if needed       Thank you for coming to see [place] at [facility]. I hope we have been able to provide you high quality care today.  You may receive a patient satisfaction survey over the next few weeks. We would appreciate your feedback and comments so that we may continue to improve ourselves and the health of our patients.

## 2019-05-07 ENCOUNTER — Encounter: Payer: Self-pay | Admitting: Family Medicine

## 2019-05-07 ENCOUNTER — Other Ambulatory Visit: Payer: Self-pay | Admitting: Family Medicine

## 2019-05-09 MED ORDER — AMLODIPINE BESYLATE 10 MG PO TABS: 10.0000 mg | ORAL_TABLET | Freq: Every day | ORAL | 0 refills | Status: DC

## 2019-05-09 MED ORDER — METOPROLOL SUCCINATE ER 25 MG PO TB24: 25.0000 mg | ORAL_TABLET | Freq: Every day | ORAL | 0 refills | Status: DC

## 2019-05-09 NOTE — Telephone Encounter (Signed)
E-scribed amlodipine.  Notified pt via [place].

## 2019-05-09 NOTE — Progress Notes (Signed)
I agree with the above plan 

## 2019-05-11 ENCOUNTER — Encounter: Payer: Self-pay | Admitting: Family Medicine

## 2019-05-12 ENCOUNTER — Other Ambulatory Visit: Payer: Self-pay | Admitting: Adult Health

## 2019-05-12 ENCOUNTER — Encounter: Payer: Self-pay | Admitting: Adult Health

## 2019-05-12 ENCOUNTER — Telehealth: Payer: Self-pay | Admitting: Adult Health

## 2019-05-12 ENCOUNTER — Encounter: Payer: Self-pay | Admitting: Family Medicine

## 2019-05-12 DIAGNOSIS — G8114 Spastic hemiplegia affecting left nondominant side: Secondary | ICD-10-CM

## 2019-05-12 DIAGNOSIS — I69354 Hemiplegia and hemiparesis following cerebral infarction affecting left non-dominant side: Secondary | ICD-10-CM

## 2019-05-12 MED ORDER — ESCITALOPRAM OXALATE 5 MG PO TABS: 5.0000 mg | ORAL_TABLET | Freq: Every day | ORAL | 3 refills | Status: DC

## 2019-05-12 NOTE — Telephone Encounter (Signed)
Bedside trapeze order placed, also filled out written Rx for this. plz notify [place].

## 2019-05-12 NOTE — Telephone Encounter (Addendum)
Called and spoke to Julia Banks with [facility]).  He stated the thing they are looking for is a free standing floor trapeze bar.  I called daughter, Julia Banks about which DME she has used in the past.

## 2019-05-12 NOTE — Progress Notes (Signed)
lex

## 2019-05-12 NOTE — Telephone Encounter (Signed)
Order placed for freestanding floor trapeze bar which will greatly benefit patient in regards to left hemiparesis for bed mobility and prevention of injury to left arm

## 2019-05-12 NOTE — Telephone Encounter (Signed)
[place]) is asking for a call from [place]

## 2019-05-12 NOTE — Addendum Note (Signed)
Addended by: Mal Misty on: 05/12/2019 01:32 PM   Modules accepted: Orders

## 2019-05-16 MED ORDER — HYDROCHLOROTHIAZIDE 12.5 MG PO CAPS: 12.5000 mg | ORAL_CAPSULE | Freq: Every day | ORAL | 1 refills | Status: DC

## 2019-05-16 NOTE — Telephone Encounter (Signed)
Fax confirmation received for order for DME to [facility] [phone].

## 2019-05-17 ENCOUNTER — Ambulatory Visit (INDEPENDENT_AMBULATORY_CARE_PROVIDER_SITE_OTHER): Payer: Medicare Other | Admitting: Cardiovascular Disease

## 2019-05-17 ENCOUNTER — Encounter: Payer: Self-pay | Admitting: Cardiovascular Disease

## 2019-05-17 ENCOUNTER — Other Ambulatory Visit: Payer: Self-pay

## 2019-05-17 VITALS — BP 164/54 | HR 59 | Ht 66.0 in | Wt 180.0 lb

## 2019-05-17 DIAGNOSIS — E785 Hyperlipidemia, unspecified: Secondary | ICD-10-CM

## 2019-05-17 DIAGNOSIS — I2510 Atherosclerotic heart disease of native coronary artery without angina pectoris: Secondary | ICD-10-CM

## 2019-05-17 DIAGNOSIS — I10 Essential (primary) hypertension: Secondary | ICD-10-CM

## 2019-05-17 DIAGNOSIS — I639 Cerebral infarction, unspecified: Secondary | ICD-10-CM

## 2019-05-17 DIAGNOSIS — E1169 Type 2 diabetes mellitus with other specified complication: Secondary | ICD-10-CM

## 2019-05-17 MED ORDER — CARVEDILOL 6.25 MG PO TABS: 6.2500 mg | ORAL_TABLET | Freq: Two times a day (BID) | ORAL | 3 refills | Status: DC

## 2019-05-17 MED ORDER — REPATHA SURECLICK 140 MG/ML ~~LOC~~ SOAJ: 1.0000 "pen " | SUBCUTANEOUS | 5 refills | Status: DC

## 2019-05-17 NOTE — Progress Notes (Signed)
Cardiology Office Note   Date:  05/17/2019   ID:  Julia Banks, Julia Banks Julia Banks 08, 1942, MRN [id number]  PCP:  Ria Bush, MD  Cardiologist:   Kathlyn Sacramento, MD   Chief Complaint  Patient presents with  . other    Patient c/o Low BP and swelling in feet. meds reviewed verbally with patient.       History of Present Illness: Julia Banks is a 78 y.o. female who presents for a follow-up visit after a recent stroke. The patient has multiple risk factors for coronary artery disease including diabetes which has been mostly uncontrolled, hypertension, chronic kidney disease, hyperlipidemia and obesity.  She was seen by me in 2015 for nocturnal chest pain worrisome for angina.  Cardiac catheterization at that time showed mild nonobstructive 2 vessel coronary artery disease (20% in the left circumflex and 30% in the RCA). Ejection fraction was normal.  It was felt that some of her nocturnal chest pain might have been related to sleep apnea and improved with using CPAP.  She was hospitalized in September of this year with left-sided weakness and numbness.  MRI of the brain showed acute infarct at the right basal ganglia and corona radiata.  Echocardiogram showed an EF of 60 to 65% with no significant valvular abnormalities.  The stroke was determined to be most likely due to small vessel disease.  However, embolic event could not be ruled out.  She was discharged to inpatient rehab where she stayed for few weeks. She has history of hyperlipidemia with intolerance to statins.  She has been only able to tolerate pravastatin and otherwise had severe myalgia with all other statins.  She also did not tolerate Zetia.  She continues to have left-sided weakness.  She has intermittent atypical chest pain with stable exertional dyspnea.  Her blood pressure has been running high.  Small dose hydrochlorothiazide was recently added by Dr. Danise Mina.  Past Medical History:  Diagnosis Date  .  Arthritis   . Asthma   . Breast cancer, left breast ([place]) 02/2014   DCIS, ER+ PR- initially rec bilat mastectomy with sentinal LN biopsy L axilla but now planning just L mastectomy [name])  . Chronic kidney disease, stage 3   . Colon polyps    benign  . Complication of anesthesia    hard to wake up  . Coronary artery disease    30% stenosis reported on previous cardiac catheterization in 2006. This was done in [facility]  . GERD (gastroesophageal reflux disease)   . Headaches, cluster   . Hyperlipidemia   . Hypertension   . Hypothyroidism   . Osteopenia 07/2014   DEXA 0.5 spine, 01.7 R femoral neck  . Pneumonia    years ago  . Secondary hyperparathyroidism of renal origin ([place])    likely  . Sensorineural hearing loss (SNHL) of both ears 01/27/2016   Audiology eval - moderately severe sensorineural hearing loss bilaterally rec amplification (07/2016 - [name] audiologist)  . Sleep apnea    on CPAP  . Thyroid disease   . Type 2 diabetes, uncontrolled, with neuropathy ([place])    followed by endo Dr Buddy Duty    Past Surgical History:  Procedure Laterality Date  . ABDOMINAL HYSTERECTOMY     heavy bleeding, ovaries remain  . APPENDECTOMY    . BREAST BIOPSY Right    benign  . BREAST BIOPSY Left 02/2014   ?DCIS  . CARDIAC CATHETERIZATION  2006   [place]   Marland Kitchen CARDIAC CATHETERIZATION  10/2013   mild 2v nonobstructive disease, EF 60%  . CARDIOVASCULAR STRESS TEST  09/2012   WNL ()  . CHOLECYSTECTOMY    . COLONOSCOPY  05/2010   2 TAs, diverticulosis, rpt 2 yrs (Dr Elissa Hefty in [place])  . COLONOSCOPY  12/2014   1 TA, rpt 5 yrs Henrene Pastor)  . cortisone right knee  01/04/15  . GALLBLADDER SURGERY    . MASTECTOMY W/ SENTINEL NODE BIOPSY Left 06/07/2014   DR WAKEFIELD  . SIMPLE MASTECTOMY WITH AXILLARY SENTINEL NODE BIOPSY Left 06/07/2014   Procedure: LEFT TOTAL MASTECTOMY WITH LEFT AXILLARY SENTINEL NODE BIOPSY;  Surgeon: Rolm Bookbinder, MD;   Location: [facility];  Service: General;  Laterality: Left;     Current Outpatient Medications  Medication Sig Dispense Refill  . acetaminophen (TYLENOL) 325 MG tablet Take 2 tablets (650 mg total) by mouth every 4 (four) hours as needed for mild pain (or temp > 37.5 C (99.5 F)).    Marland Kitchen albuterol (PROVENTIL HFA;VENTOLIN HFA) 108 (90 BASE) MCG/ACT inhaler Inhale into the lungs every 6 (six) hours as needed for wheezing or shortness of breath.    Marland Kitchen amLODipine (NORVASC) 10 MG tablet Take 1 tablet (10 mg total) by mouth daily. 30 tablet 0  . Ascorbic Acid (VITAMIN C WITH ROSE HIPS) 500 MG tablet Take 500 mg by mouth daily.    Marland Kitchen aspirin EC 81 MG EC tablet Take 1 tablet (81 mg total) by mouth daily. 30 tablet 2  . baclofen 5 MG TABS Take 5 mg by mouth 3 (three) times daily. 90 tablet 3  . Cholecalciferol (VITAMIN D PO) Take 5,000 Units by mouth.    . docusate sodium (COLACE) 100 MG capsule Take 200 mg by mouth daily.     Marland Kitchen escitalopram (LEXAPRO) 5 MG tablet Take 1 tablet (5 mg total) by mouth daily. 30 tablet 3  . famotidine (PEPCID) 20 MG tablet Take 1 tablet (20 mg total) by mouth at bedtime. 30 tablet 3  . Fluticasone-Salmeterol (ADVAIR) 250-50 MCG/DOSE AEPB Inhale 1 puff into the lungs 2 (two) times daily. 60 each 0  . hydrochlorothiazide (MICROZIDE) 12.5 MG capsule Take 1 capsule (12.5 mg total) by mouth daily. 90 capsule 1  . Insulin Glargine (LANTUS) 100 UNIT/ML Solostar Pen Inject 10 Units into the skin daily. 15 mL 11  . Insulin Glargine (LANTUS) 100 UNIT/ML Solostar Pen Inject 32 Units into the skin at bedtime. 15 mL 11  . levothyroxine (SYNTHROID, LEVOTHROID) 25 MCG tablet TAKE 1 TABLET BY MOUTH DAILY. EXCEPT 2 TABLETS ON MON, WED, AND FRI (Patient taking differently: Take 25-50 mcg by mouth See admin instructions. TAKE 1 TABLET BY MOUTH DAILY. EXCEPT 2 TABLETS ON MON, WED, AND FRI) 130 tablet 3  . losartan (COZAAR) 100 MG tablet Take 1 tablet (100 mg total) by mouth daily. 30 tablet 1  . Omega-3  Fatty Acids (OMEGA 3 PO) Take 1 capsule by mouth daily. 180 EPA/120DHA     . pravastatin (PRAVACHOL) 40 MG tablet Take 1 tablet (40 mg total) by mouth daily. 90 tablet 3  . vitamin B-12 (CYANOCOBALAMIN) 1000 MCG tablet Take 1,000 mcg by mouth daily.    . carvedilol (COREG) 6.25 MG tablet Take 1 tablet (6.25 mg total) by mouth 2 (two) times daily. 180 tablet 3  . Evolocumab (REPATHA SURECLICK) [id number] MG/ML SOAJ Inject 1 pen into the skin every 14 (fourteen) days. 2 pen 5   No current facility-administered medications for this  visit.     Allergies:   Altace [ramipril], Aspirin, and Metformin and related    Social History:  The patient  reports that she has never smoked. She has never used smokeless tobacco. She reports that she does not drink alcohol or use drugs.   Family History:  The patient's family history includes Arthritis in her father and mother; Breast cancer in an other family member; CAD in her father; Colon cancer in her maternal uncle; Diabetes in her mother; Hyperlipidemia in her father and mother; Hypertension in her father and mother; Leukemia in her brother; Melanoma (age of onset: 75) in an other family member; Other in her mother; Ovarian cancer in an other family member; Ovarian cancer (age of onset: 59) in her maternal grandmother; Stroke in her mother.    ROS:  Please see the history of present illness.   Otherwise, review of systems are positive for none.   All other systems are reviewed and negative.    PHYSICAL EXAM: VS:  BP (!) 164/54 (BP Location: Right Arm, Patient Position: Sitting, Cuff Size: Normal)   Pulse (!) 59   Ht 5\' 6"  (1.676 m)   Wt 180 lb (81.6 kg) Comment: patient reported  BMI 29.05 kg/m  , BMI Body mass index is 29.05 kg/m. GEN: Well nourished, well developed, in no acute distress  HEENT: normal  Neck: no JVD, carotid bruits, or masses Cardiac: RRR; no murmurs, rubs, or gallops,no edema  Respiratory:  clear to auscultation bilaterally, normal work  of breathing GI: soft, nontender, nondistended, + BS MS: no deformity or atrophy  Skin: warm and dry, no rash Neuro: Left-sided weakness noted Psych: euthymic mood, full affect   EKG:  EKG is ordered today. The ekg ordered today demonstrates sinus bradycardia with no significant ST or T wave changes.   Recent Labs: 03/08/2019: TSH 3.531 03/10/2019: ALT 14 03/21/2019: Hemoglobin 11.9; Platelets 166 03/24/2019: BUN 19; Potassium 4.1; Sodium 138 03/30/2019: Creatinine, Ser 1.31    Lipid Panel    Component Value Date/Time   CHOL 207 (H) 03/08/2019 0327   TRIG 160 (H) 03/08/2019 0327   HDL 31 (L) 03/08/2019 0327   CHOLHDL 6.7 03/08/2019 0327   VLDL 32 03/08/2019 0327   LDLCALC 144 (H) 03/08/2019 0327   LDLDIRECT 112.0 03/30/2017 1018      Wt Readings from Last 3 Encounters:  05/17/19 180 lb (81.6 kg)  03/30/19 190 lb 0.6 oz (86.2 kg)  01/17/19 183 lb 8 oz (83.2 kg)       No flowsheet data found.    ASSESSMENT AND PLAN:  1.  Recent stroke: Likely due to small vessel disease but embolic stroke cannot be completely excluded.  CTA of the neck showed no obstructive carotid disease.  She is currently wearing a 30-day event monitor to evaluate for possible atrial fibrillation.  2.  Uncontrolled hypertension: Currently on amlodipine, losartan and metoprolol.  Small dose hydrochlorothiazide was recently added.  I elected to switch metoprolol to carvedilol 6.25 mg twice daily especially in the setting of bradycardia.  If blood pressure remains uncontrolled, I plan on obtaining renal artery duplex to evaluate for possible renal artery stenosis as a culprit.  3.  Hyperlipidemia with intolerance to statins: She is only able to tolerate pravastatin and recent lipid profile showed an LDL of 144.  Given recent history of stroke and diabetes, will need to be more aggressive and get to a target LDL below 70.  Thus, I recommend treatment with a PCSK9 inhibitor  and started Repatha 140 mg every 2  weeks.    Disposition:   FU with me in 3 months  Signed,  Kathlyn Sacramento, MD  05/17/2019 3:27 PM    [facility]

## 2019-05-17 NOTE — Patient Instructions (Signed)
Medication Instructions:  Your physician has recommended you make the following change in your medication:  1) STOP Metoprolol 2) START Carvedilol 6.25mg  twice daily. An Rx has been sent to [facility]. 3) START Repatha 140mg  injectable every 14 days. An Rx has been sent to your pharmacy  Dr. Fletcher Anon recommends Repatha (PCSK9). This is an injectable cholesterol medication self-administered. This medication will need prior approval with your insurance company, which we will work on. If the medication is not approved initially, we may need to do an appeal with your insurance. We will keep you updated on this process.   If you need co-pay assistance, please look into the program at healthwellfoundation.[email or web address] >> disease funds >> hypercholesterolemia. This is an online application or you can call to completed.        *If you need a refill on your cardiac medications before your next appointment, please call your pharmacy*  Lab Work: Your physician recommends that you return for a FASTING lipid profile and hepatic panel in 2 months.  Please have your labs drawn at [facility]. You do not need an appointment. Their hours are Mon-Fri 7am-6pm. You do not need an appointment.  If you have labs (blood work) drawn today and your tests are completely normal, you will receive your results only by: Marland Kitchen MyChart Message (if you have MyChart) OR . A paper copy in the mail If you have any lab test that is abnormal or we need to change your treatment, we will call you to review the results.  Testing/Procedures: None ordered  Follow-Up: At [facility], you and your health needs are our priority.  As part of our continuing mission to provide you with exceptional heart care, we have created designated Provider Care Teams.  These Care Teams include your primary Cardiologist (physician) and Advanced Practice Providers (APPs -  Physician Assistants and Nurse Practitioners) who all work together to  provide you with the care you need, when you need it.  Your next appointment:   3 month(s)  The format for your next appointment:   In Person  Provider:    You may see Dr. Fletcher Anon  or one of the following Advanced Practice Providers on your designated Care Team:    Murray Hodgkins, NP  Christell Faith, PA-C  Marrianne Mood, PA-C   Other Instructions N/A

## 2019-05-22 ENCOUNTER — Encounter: Payer: Self-pay | Admitting: Adult Health

## 2019-05-23 ENCOUNTER — Other Ambulatory Visit: Payer: Self-pay

## 2019-05-23 ENCOUNTER — Other Ambulatory Visit (INDEPENDENT_AMBULATORY_CARE_PROVIDER_SITE_OTHER): Payer: Medicare Other

## 2019-05-23 ENCOUNTER — Other Ambulatory Visit: Payer: Self-pay | Admitting: Family Medicine

## 2019-05-23 DIAGNOSIS — N183 Chronic kidney disease, stage 3 unspecified: Secondary | ICD-10-CM | POA: Diagnosis not present

## 2019-05-23 DIAGNOSIS — E785 Hyperlipidemia, unspecified: Secondary | ICD-10-CM | POA: Diagnosis not present

## 2019-05-23 DIAGNOSIS — E1169 Type 2 diabetes mellitus with other specified complication: Secondary | ICD-10-CM

## 2019-05-23 DIAGNOSIS — E1122 Type 2 diabetes mellitus with diabetic chronic kidney disease: Secondary | ICD-10-CM

## 2019-05-23 DIAGNOSIS — E039 Hypothyroidism, unspecified: Secondary | ICD-10-CM

## 2019-05-23 DIAGNOSIS — N2581 Secondary hyperparathyroidism of renal origin: Secondary | ICD-10-CM

## 2019-05-23 LAB — CBC WITH DIFFERENTIAL/PLATELET
Basophils Absolute: 0.1 10*3/uL (ref 0.0–0.1)
Basophils Relative: 0.6 % (ref 0.0–3.0)
Eosinophils Absolute: 0.6 10*3/uL (ref 0.0–0.7)
Eosinophils Relative: 6.7 % — ABNORMAL HIGH (ref 0.0–5.0)
HCT: 35.8 % — ABNORMAL LOW (ref 36.0–46.0)
Hemoglobin: 12.2 g/dL (ref 12.0–15.0)
Lymphocytes Relative: 23.1 % (ref 12.0–46.0)
Lymphs Abs: 2 10*3/uL (ref 0.7–4.0)
MCHC: 34.1 g/dL (ref 30.0–36.0)
MCV: 86.3 fl (ref 78.0–100.0)
Monocytes Absolute: 0.5 10*3/uL (ref 0.1–1.0)
Monocytes Relative: 6.2 % (ref 3.0–12.0)
Neutro Abs: 5.5 10*3/uL (ref 1.4–7.7)
Neutrophils Relative %: 63.4 % (ref 43.0–77.0)
Platelets: 215 10*3/uL (ref 150.0–400.0)
RBC: 4.15 Mil/uL (ref 3.87–5.11)
RDW: 14.8 % (ref 11.5–15.5)
WBC: 8.6 10*3/uL (ref 4.0–10.5)

## 2019-05-23 LAB — COMPREHENSIVE METABOLIC PANEL
ALT: 13 U/L (ref 0–35)
AST: 13 U/L (ref 0–37)
Albumin: 3.6 g/dL (ref 3.5–5.2)
Alkaline Phosphatase: 90 U/L (ref 39–117)
BUN: 24 mg/dL — ABNORMAL HIGH (ref 6–23)
CO2: 27 mEq/L (ref 19–32)
Calcium: 9.3 mg/dL (ref 8.4–10.5)
Chloride: 104 mEq/L (ref 96–112)
Creatinine, Ser: 1.26 mg/dL — ABNORMAL HIGH (ref 0.40–1.20)
GFR: 41.26 mL/min — ABNORMAL LOW (ref >60.00)
Glucose, Bld: 111 mg/dL — ABNORMAL HIGH (ref 70–99)
Potassium: 4.7 mEq/L (ref 3.5–5.1)
Sodium: 138 mEq/L (ref 135–145)
Total Bilirubin: 0.5 mg/dL (ref 0.2–1.2)
Total Protein: 6.3 g/dL (ref 6.0–8.3)

## 2019-05-23 LAB — LIPID PANEL
Cholesterol: 174 mg/dL (ref 0–200)
HDL: 36.2 mg/dL — ABNORMAL LOW (ref >39.00)
LDL Cholesterol: 107 mg/dL — ABNORMAL HIGH (ref 0–99)
NonHDL: 138.11
Total CHOL/HDL Ratio: 5
Triglycerides: 155 mg/dL — ABNORMAL HIGH (ref 0.0–149.0)
VLDL: 31 mg/dL (ref 0.0–40.0)

## 2019-05-23 LAB — TSH: TSH: 2.64 u[IU]/mL (ref 0.35–4.50)

## 2019-05-23 LAB — HEMOGLOBIN A1C: Hgb A1c MFr Bld: 7.5 % — ABNORMAL HIGH (ref 4.6–6.5)

## 2019-05-23 LAB — VITAMIN D 25 HYDROXY (VIT D DEFICIENCY, FRACTURES): VITD: 43.27 ng/mL (ref 30.00–100.00)

## 2019-05-24 ENCOUNTER — Other Ambulatory Visit: Payer: Self-pay | Admitting: Adult Health

## 2019-05-24 LAB — PARATHYROID HORMONE, INTACT (NO CA): PTH: 27 pg/mL (ref 14–64)

## 2019-05-24 MED ORDER — ESCITALOPRAM OXALATE 10 MG PO TABS: 10.0000 mg | ORAL_TABLET | Freq: Every day | ORAL | 3 refills | Status: DC

## 2019-05-25 ENCOUNTER — Telehealth: Payer: Self-pay

## 2019-05-25 NOTE — Telephone Encounter (Signed)
Prior Authorization request received via fax from [facility]. KEY: [facility] Electronic request form from [employer] completed through [email or web address].  Request approved. Case ID [id number] Coverage Start Date: 04/25/2019 through 05/24/2020

## 2019-05-27 ENCOUNTER — Other Ambulatory Visit: Payer: Self-pay | Admitting: Family Medicine

## 2019-05-30 ENCOUNTER — Ambulatory Visit (INDEPENDENT_AMBULATORY_CARE_PROVIDER_SITE_OTHER): Payer: Medicare Other | Admitting: Family Medicine

## 2019-05-30 ENCOUNTER — Other Ambulatory Visit: Payer: Self-pay

## 2019-05-30 ENCOUNTER — Encounter: Payer: Self-pay | Admitting: Family Medicine

## 2019-05-30 VITALS — BP 138/63 | HR 63 | Ht 66.0 in

## 2019-05-30 DIAGNOSIS — N2581 Secondary hyperparathyroidism of renal origin: Secondary | ICD-10-CM | POA: Diagnosis not present

## 2019-05-30 DIAGNOSIS — E039 Hypothyroidism, unspecified: Secondary | ICD-10-CM

## 2019-05-30 DIAGNOSIS — E1122 Type 2 diabetes mellitus with diabetic chronic kidney disease: Secondary | ICD-10-CM

## 2019-05-30 DIAGNOSIS — F330 Major depressive disorder, recurrent, mild: Secondary | ICD-10-CM

## 2019-05-30 DIAGNOSIS — Z0000 Encounter for general adult medical examination without abnormal findings: Secondary | ICD-10-CM

## 2019-05-30 DIAGNOSIS — Z7189 Other specified counseling: Secondary | ICD-10-CM | POA: Diagnosis not present

## 2019-05-30 DIAGNOSIS — E785 Hyperlipidemia, unspecified: Secondary | ICD-10-CM

## 2019-05-30 DIAGNOSIS — E1169 Type 2 diabetes mellitus with other specified complication: Secondary | ICD-10-CM

## 2019-05-30 DIAGNOSIS — M8580 Other specified disorders of bone density and structure, unspecified site: Secondary | ICD-10-CM

## 2019-05-30 DIAGNOSIS — I10 Essential (primary) hypertension: Secondary | ICD-10-CM | POA: Diagnosis not present

## 2019-05-30 DIAGNOSIS — I2510 Atherosclerotic heart disease of native coronary artery without angina pectoris: Secondary | ICD-10-CM

## 2019-05-30 DIAGNOSIS — IMO0002 Reserved for concepts with insufficient information to code with codable children: Secondary | ICD-10-CM

## 2019-05-30 DIAGNOSIS — E1140 Type 2 diabetes mellitus with diabetic neuropathy, unspecified: Secondary | ICD-10-CM | POA: Diagnosis not present

## 2019-05-30 DIAGNOSIS — E1165 Type 2 diabetes mellitus with hyperglycemia: Secondary | ICD-10-CM

## 2019-05-30 DIAGNOSIS — I69354 Hemiplegia and hemiparesis following cerebral infarction affecting left non-dominant side: Secondary | ICD-10-CM

## 2019-05-30 DIAGNOSIS — N183 Chronic kidney disease, stage 3 unspecified: Secondary | ICD-10-CM

## 2019-05-30 MED ORDER — HYDROCHLOROTHIAZIDE 12.5 MG PO CAPS: 12.5000 mg | ORAL_CAPSULE | Freq: Every day | ORAL | 3 refills | Status: DC

## 2019-05-30 MED ORDER — ACETAMINOPHEN 325 MG PO TABS: 650.0000 mg | ORAL_TABLET | Freq: Three times a day (TID) | ORAL | Status: DC | PRN

## 2019-05-30 MED ORDER — FAMOTIDINE 20 MG PO TABS: 20.0000 mg | ORAL_TABLET | Freq: Every day | ORAL | 3 refills | Status: DC

## 2019-05-30 MED ORDER — LOSARTAN POTASSIUM 100 MG PO TABS: 100.0000 mg | ORAL_TABLET | Freq: Every day | ORAL | 3 refills | Status: DC

## 2019-05-30 MED ORDER — LEVOTHYROXINE SODIUM 25 MCG PO TABS: ORAL_TABLET | ORAL | 3 refills | Status: DC

## 2019-05-30 MED ORDER — PRAVASTATIN SODIUM 40 MG PO TABS: 40.0000 mg | ORAL_TABLET | Freq: Every day | ORAL | 3 refills | Status: DC

## 2019-05-30 MED ORDER — AMLODIPINE BESYLATE 10 MG PO TABS: 10.0000 mg | ORAL_TABLET | Freq: Every day | ORAL | 3 refills | Status: DC

## 2019-05-30 MED ORDER — FLUTICASONE-SALMETEROL 250-50 MCG/DOSE IN AEPB: 1.0000 | INHALATION_SPRAY | Freq: Two times a day (BID) | RESPIRATORY_TRACT | 11 refills | Status: DC

## 2019-05-30 MED ORDER — BD PEN NEEDLE NANO U/F 32G X 4 MM MISC: 3 refills | Status: AC

## 2019-05-30 NOTE — Assessment & Plan Note (Addendum)
Stable CKD

## 2019-05-30 NOTE — Assessment & Plan Note (Addendum)
Improved on recheck.  

## 2019-05-30 NOTE — Assessment & Plan Note (Signed)
Continues daily vit D - will be due for updated DEXA next year.

## 2019-05-30 NOTE — Assessment & Plan Note (Signed)
Mild, nonobstructive by cath 2015.

## 2019-05-30 NOTE — Assessment & Plan Note (Signed)
Doing well on lexapro 10mg  daily started by neurology for situational depression due to impaired function after stroke with residual hemiparesis.

## 2019-05-30 NOTE — Assessment & Plan Note (Signed)
She continues lantus 43u daily and requests I fill - will send in lantus refill.

## 2019-05-30 NOTE — Assessment & Plan Note (Signed)

## 2019-05-30 NOTE — Assessment & Plan Note (Signed)
Chronic, stable. Continue current regimen. 

## 2019-05-30 NOTE — Assessment & Plan Note (Signed)
R-hand dominant. Discharged from [place], asks about further assistance. Offered outpatient PT - declines at this time due to transportation issues.

## 2019-05-30 NOTE — Progress Notes (Signed)
Virtual visit completed through [employer]. Due to national recommendations of social distancing due to COVID-19, a virtual visit is felt to be most appropriate for this patient at this time. Reviewed limitations of a virtual visit.   Patient location: home, son Harrie Jeans is also present on call Provider location: [place] at [facility], office If any vitals were documented, they were collected by patient at home unless specified below.    BP 138/63    Pulse 63    Ht 5\' 6"  (1.676 m)    BMI 29.05 kg/m    CC: AMW Subjective:    Patient ID: Julia Banks, female    DOB: Mar 17, 1941, 78 y.o.   MRN: [id number]  HPI: Julia Banks is a 78 y.o. female presenting on 05/30/2019 for Medicare Wellness   Did not see health advisor this year.   Hearing Screening   125Hz  250Hz  500Hz  1000Hz  2000Hz  3000Hz  4000Hz  6000Hz  8000Hz   Right ear:           Left ear:           Vision Screening Comments: Last eye exam 05/2018    Office Visit from 05/30/2019 in [place] at [facility]  PHQ-2 Total Score  0      Fall Risk  05/30/2019 04/27/2019 04/09/2018 01/26/2017 01/25/2016  Falls in the past year? 0 0 No No No  Number falls in past yr: - - - - -  Comment - - - - -  Injury with Fall? - - - - -  Comment - - - - -     Struggled with depression after recent stroke. Neurology started lexapro - up to 10mg  daily with benefit. Unable to tolerate statins - rec PCSK9 inhibitor, started on repatha 140mg  Q2 wks - this was just approved so she has yet to start. Latest neuro and cards notes reviewed. Also sees [facility] with residual L hemiparesis after stroke (R handed).   DM - fasting sugars running 160+. Checks twice daily. Has not seen endo Dr Buddy Duty this year. Requests I refill lantus.   HHPT finished coming out to house last week.   Preventative: COLONOSCOPY 7/20161 TA, rpt 5 yrs Henrene Pastor)  Well woman - s/p hysterectomy for benign reason.Last well woman exam with Dr. Helane Rima 2019, seen  yearly. Breast cancer - s/p L mastectomy 2015 - released from care of Dr Lindi Adie. Unable to tolerate anastrazole, sees Dr [name] regularly.mammo 05/2018 DEXA 2016 - through Dr Christen Butter office. Osteopenia - 0.5 spine, -1.7 R femoral neck Flu shot - declines  Tdap 2007  prevnar 2006, pneumovax 2014 shingrix - to check with pharmacy  Advanced directives: has this at home. HC proxy would be niece [name] Flinchum. Aasked to bring me copy.  Seat belt use discussed Sunscreen use discussed. No changing moles on skin. Sees derm. Non smoker Alcohol - none Dentist Q6 mo  Eye exam yearly  Bowel - some constipation managed with metamucil. Bladder - no incontinence   Widow, husband Fritz Pickerel passed 2015. Has 3 dogs  No children. Niece [name] supportive 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ist, nutritional  Edu: 1 yr college  Activity: stationary bicycle, some walking, yardwork Diet: limited water intake, good fruits/vegetables daily     Relevant past medical, surgical, family and social history reviewed and updated as indicated. Interim medical history since our last visit reviewed. Allergies and medications reviewed and updated. Outpatient Medications Prior to Visit  Medication Sig Dispense Refill   albuterol (PROVENTIL HFA;VENTOLIN  HFA) 108 (90 BASE) MCG/ACT inhaler Inhale into the lungs every 6 (six) hours as needed for wheezing or shortness of breath.     Ascorbic Acid (VITAMIN C WITH ROSE HIPS) 500 MG tablet Take 500 mg by mouth daily.     aspirin EC 81 MG EC tablet Take 1 tablet (81 mg total) by mouth daily. 30 tablet 2   baclofen 5 MG TABS Take 5 mg by mouth 3 (three) times daily. 90 tablet 3   carvedilol (COREG) 6.25 MG tablet Take 1 tablet (6.25 mg total) by mouth 2 (two) times daily. 180 tablet 3   Cholecalciferol (VITAMIN D PO) Take 5,000 Units by mouth.     docusate sodium (COLACE) 100 MG capsule Take 200 mg by mouth daily.      escitalopram  (LEXAPRO) 10 MG tablet Take 1 tablet (10 mg total) by mouth daily. 30 tablet 3   Evolocumab (REPATHA SURECLICK) [id number] MG/ML SOAJ Inject 1 pen into the skin every 14 (fourteen) days. 2 pen 5   Insulin Glargine (LANTUS) 100 UNIT/ML Solostar Pen Inject 42 Units into the skin at bedtime. 45 mL 3   Omega-3 Fatty Acids (OMEGA 3 PO) Take 1 capsule by mouth daily. 180 EPA/120DHA      vitamin B-12 (CYANOCOBALAMIN) 1000 MCG tablet Take 1,000 mcg by mouth daily.     acetaminophen (TYLENOL) 325 MG tablet Take 2 tablets (650 mg total) by mouth every 4 (four) hours as needed for mild pain (or temp > 37.5 C (99.5 F)).     amLODipine (NORVASC) 10 MG tablet Take 1 tablet (10 mg total) by mouth daily. 30 tablet 0   BD PEN NEEDLE NANO U/F 32G X 4 MM MISC USE 1 AS DIRECTED TO INJECT MEDICATION DAILY. 100 each 1   famotidine (PEPCID) 20 MG tablet Take 1 tablet (20 mg total) by mouth at bedtime. 30 tablet 3   Fluticasone-Salmeterol (ADVAIR) 250-50 MCG/DOSE AEPB Inhale 1 puff into the lungs 2 (two) times daily. 60 each 0   hydrochlorothiazide (MICROZIDE) 12.5 MG capsule Take 1 capsule (12.5 mg total) by mouth daily. 90 capsule 1   Insulin Glargine (LANTUS) 100 UNIT/ML Solostar Pen Inject 10 Units into the skin daily. 15 mL 11   Insulin Glargine (LANTUS) 100 UNIT/ML Solostar Pen Inject 32 Units into the skin at bedtime. 15 mL 11   levothyroxine (SYNTHROID) 25 MCG tablet TAKE 1 TABLET BY MOUTH DAILY. EXCEPT 2 TABLETS ON MON, WED, AND FRI 120 tablet 4   losartan (COZAAR) 100 MG tablet Take 1 tablet (100 mg total) by mouth daily. 30 tablet 1   pravastatin (PRAVACHOL) 40 MG tablet Take 1 tablet (40 mg total) by mouth daily. 90 tablet 3   No facility-administered medications prior to visit.      Per HPI unless specifically indicated in ROS section below Review of Systems Objective:    BP 138/63    Pulse 63    Ht 5\' 6"  (1.676 m)    BMI 29.05 kg/m   Wt Readings from Last 3 Encounters:  05/17/19 180 lb (81.6  kg)  03/30/19 190 lb 0.6 oz (86.2 kg)  01/17/19 183 lb 8 oz (83.2 kg)     Physical exam: Gen: alert, NAD, not ill appearing Pulm: speaks in complete sentences without increased work of breathing Psych: normal mood, normal thought content      Results for orders placed or performed in visit on 05/23/19  Parathyroid hormone, intact (no Ca)  Result Value Ref Range  PTH 27 14 - 64 pg/mL  vit d  Result Value Ref Range   VITD 43.27 30.00 - 100.00 ng/mL  CBC with Differential  Result Value Ref Range   WBC 8.6 4.0 - 10.5 K/uL   RBC 4.15 3.87 - 5.11 Mil/uL   Hemoglobin 12.2 12.0 - 15.0 g/dL   HCT 35.8 (L) 36.0 - 46.0 %   MCV 86.3 78.0 - 100.0 fl   MCHC 34.1 30.0 - 36.0 g/dL   RDW 14.8 11.5 - 15.5 %   Platelets 215.0 150.0 - 400.0 K/uL   Neutrophils Relative % 63.4 43.0 - 77.0 %   Lymphocytes Relative 23.1 12.0 - 46.0 %   Monocytes Relative 6.2 3.0 - 12.0 %   Eosinophils Relative 6.7 (H) 0.0 - 5.0 %   Basophils Relative 0.6 0.0 - 3.0 %   Neutro Abs 5.5 1.4 - 7.7 K/uL   Lymphs Abs 2.0 0.7 - 4.0 K/uL   Monocytes Absolute 0.5 0.1 - 1.0 K/uL   Eosinophils Absolute 0.6 0.0 - 0.7 K/uL   Basophils Absolute 0.1 0.0 - 0.1 K/uL  TSH  Result Value Ref Range   TSH 2.64 0.35 - 4.50 uIU/mL  Hemoglobin A1c  Result Value Ref Range   Hgb A1c MFr Bld 7.5 (H) 4.6 - 6.5 %  Comprehensive metabolic panel  Result Value Ref Range   Sodium 138 135 - 145 mEq/L   Potassium 4.7 3.5 - 5.1 mEq/L   Chloride 104 96 - 112 mEq/L   CO2 27 19 - 32 mEq/L   Glucose, Bld 111 (H) 70 - 99 mg/dL   BUN 24 (H) 6 - 23 mg/dL   Creatinine, Ser 1.26 (H) 0.40 - 1.20 mg/dL   Total Bilirubin 0.5 0.2 - 1.2 mg/dL   Alkaline Phosphatase 90 39 - 117 U/L   AST 13 0 - 37 U/L   ALT 13 0 - 35 U/L   Total Protein 6.3 6.0 - 8.3 g/dL   Albumin 3.6 3.5 - 5.2 g/dL   GFR 41.26 (L) >60.00 mL/min   Calcium 9.3 8.4 - 10.5 mg/dL  Lipid panel  Result Value Ref Range   Cholesterol 174 0 - 200 mg/dL   Triglycerides 155.0 (H) 0.0 -  149.0 mg/dL   HDL 36.20 (L) >39.00 mg/dL   VLDL 31.0 0.0 - 40.0 mg/dL   LDL Cholesterol 107 (H) 0 - 99 mg/dL   Total CHOL/HDL Ratio 5    NonHDL 138.11    Depression screen [facility] 2/9 05/30/2019 05/05/2019 04/09/2018 01/26/2017 01/25/2016  Decreased Interest 0 0 0 0 0  Down, Depressed, Hopeless 0 0 0 0 0  PHQ - 2 Score 0 0 0 0 0  Some recent data might be hidden   Assessment & Plan:   Problem List Items Addressed This Visit    Type 2 diabetes, uncontrolled, with neuropathy ([place])    She continues lantus 43u daily and requests I fill - will send in lantus refill.       Relevant Medications   Insulin Glargine (LANTUS) 100 UNIT/ML Solostar Pen   losartan (COZAAR) 100 MG tablet   pravastatin (PRAVACHOL) 40 MG tablet   Secondary hyperparathyroidism of renal origin ([place])    Improved on recheck       Osteopenia    Continues daily vit D - will be due for updated DEXA next year.       Medicare annual wellness visit, subsequent - Primary    I have personally reviewed the Medicare Annual Wellness  questionnaire and have noted 1. The patient's medical and social history 2. Their use of alcohol, tobacco or illicit drugs 3. Their current medications and supplements 4. The patient's functional ability including ADL's, fall risks, home safety risks and hearing or visual impairment. Cognitive function has been assessed and addressed as indicated.  5. Diet and physical activity 6. Evidence for depression or mood disorders The patients weight, height, BMI have been recorded in the chart. I have made referrals, counseling and provided education to the patient based on review of the above and I have provided the pt with a written personalized care plan for preventive services. Provider list updated.. See scanned questionairre as needed for further documentation. Reviewed preventative protocols and updated unless pt declined.       MDD (major depressive disorder), recurrent episode, mild ([place])     Doing well on lexapro 10mg  daily started by neurology for situational depression due to impaired function after stroke with residual hemiparesis.       Hypothyroidism    Chronic, stable. Continue current regimen.       Relevant Medications   levothyroxine (SYNTHROID) 25 MCG tablet   Hyperlipidemia associated with type 2 diabetes mellitus ([place])    Improving on pravastatin but remains above goal - now to start repatha. Anticipate significant improvement.  The ASCVD Risk score Mikey Bussing DC Jr., et al., 2013) failed to calculate for the following reasons:   The patient has a prior MI or stroke diagnosis       Relevant Medications   Insulin Glargine (LANTUS) 100 UNIT/ML Solostar Pen   losartan (COZAAR) 100 MG tablet   pravastatin (PRAVACHOL) 40 MG tablet   Hemiparesis affecting left side as late effect of cerebrovascular accident (CVA) ([place])    R-hand dominant. Discharged from [facility], asks about further assistance. Offered outpatient PT - declines at this time due to transportation issues.       Essential hypertension, benign    Chronic, stable. Continue current regimen.       Relevant Medications   amLODipine (NORVASC) 10 MG tablet   hydrochlorothiazide (MICROZIDE) 12.5 MG capsule   losartan (COZAAR) 100 MG tablet   pravastatin (PRAVACHOL) 40 MG tablet   Diabetes mellitus with stage 3 chronic kidney disease (HCC)    Stable CKD.       Relevant Medications   Insulin Glargine (LANTUS) 100 UNIT/ML Solostar Pen   losartan (COZAAR) 100 MG tablet   pravastatin (PRAVACHOL) 40 MG tablet   Coronary artery disease    Mild, nonobstructive by cath 2015.       Relevant Medications   amLODipine (NORVASC) 10 MG tablet   hydrochlorothiazide (MICROZIDE) 12.5 MG capsule   losartan (COZAAR) 100 MG tablet   pravastatin (PRAVACHOL) 40 MG tablet   Advanced care planning/counseling discussion    Advanced directives: has this at home. HC proxy would be niece [name] Flinchum. Aasked to bring me copy.            Meds ordered this encounter  Medications   amLODipine (NORVASC) 10 MG tablet    Sig: Take 1 tablet (10 mg total) by mouth daily.    Dispense:  90 tablet    Refill:  3   Insulin Pen Needle (BD PEN NEEDLE NANO U/F) 32G X 4 MM MISC    Sig: USE 1 AS DIRECTED TO INJECT MEDICATION DAILY.    Dispense:  100 each    Refill:  3   famotidine (PEPCID) 20 MG tablet    Sig: Take  1 tablet (20 mg total) by mouth at bedtime.    Dispense:  90 tablet    Refill:  3   Fluticasone-Salmeterol (ADVAIR) 250-50 MCG/DOSE AEPB    Sig: Inhale 1 puff into the lungs 2 (two) times daily.    Dispense:  60 each    Refill:  11   acetaminophen (TYLENOL) 325 MG tablet    Sig: Take 2 tablets (650 mg total) by mouth 3 (three) times daily as needed for mild pain (or temp > 37.5 C (99.5 F)).   hydrochlorothiazide (MICROZIDE) 12.5 MG capsule    Sig: Take 1 capsule (12.5 mg total) by mouth daily.    Dispense:  90 capsule    Refill:  3   levothyroxine (SYNTHROID) 25 MCG tablet    Sig: Take one tablet daily, take 2 tablets on Monday, wednesday, friday    Dispense:  120 tablet    Refill:  3   losartan (COZAAR) 100 MG tablet    Sig: Take 1 tablet (100 mg total) by mouth daily.    Dispense:  90 tablet    Refill:  3   pravastatin (PRAVACHOL) 40 MG tablet    Sig: Take 1 tablet (40 mg total) by mouth daily.    Dispense:  90 tablet    Refill:  3   No orders of the defined types were placed in this encounter.   I discussed the assessment and treatment plan with the patient. The patient was provided an opportunity to ask questions and all were answered. The patient agreed with the plan and demonstrated an understanding of the instructions. The patient was advised to call back or seek an in-person evaluation if the symptoms worsen or if the condition fails to improve as anticipated.  Follow up plan: Return in about 6 months (around 11/27/2019) for follow up visit.  Ria Bush, MD

## 2019-05-30 NOTE — Assessment & Plan Note (Addendum)
Improving on pravastatin but remains above goal - now to start repatha. Anticipate significant improvement.  The ASCVD Risk score Julia Bussing DC Jr., et al., 2013) failed to calculate for the following reasons:   The patient has a prior MI or stroke diagnosis

## 2019-05-30 NOTE — Assessment & Plan Note (Signed)
Advanced directives: has this at home. HC proxy would be niece [name] Flinchum. Aasked to bring me copy.

## 2019-06-05 DIAGNOSIS — S8012XA Contusion of left lower leg, initial encounter: Secondary | ICD-10-CM | POA: Diagnosis not present

## 2019-06-09 ENCOUNTER — Other Ambulatory Visit: Payer: Self-pay | Admitting: Cardiology

## 2019-06-09 DIAGNOSIS — I4891 Unspecified atrial fibrillation: Secondary | ICD-10-CM

## 2019-06-09 DIAGNOSIS — I639 Cerebral infarction, unspecified: Secondary | ICD-10-CM

## 2019-06-11 DIAGNOSIS — M25472 Effusion, left ankle: Secondary | ICD-10-CM | POA: Diagnosis not present

## 2019-06-11 DIAGNOSIS — M25462 Effusion, left knee: Secondary | ICD-10-CM | POA: Diagnosis not present

## 2019-06-11 DIAGNOSIS — M25562 Pain in left knee: Secondary | ICD-10-CM | POA: Diagnosis not present

## 2019-06-13 ENCOUNTER — Encounter: Payer: Medicare Other | Admitting: Physical Medicine & Rehabilitation

## 2019-06-16 ENCOUNTER — Telehealth: Payer: Self-pay

## 2019-06-16 NOTE — Telephone Encounter (Signed)
Called to give the patient cardiac monitor results. lmtcb. 

## 2019-06-16 NOTE — Telephone Encounter (Signed)
-----   Message from Wellington Hampshire, MD sent at 06/16/2019 11:44 AM EST ----- Inform patient that monitor showed no evidence of atrial fibrillation.  No significant arrhythmia noted.

## 2019-06-16 NOTE — Telephone Encounter (Signed)
DPR on file. Patients niece [name] made aware of cardiac monitor results with verbalized understanding.

## 2019-06-20 ENCOUNTER — Encounter: Payer: Self-pay | Admitting: Hematology and Oncology

## 2019-06-20 DIAGNOSIS — Z1231 Encounter for screening mammogram for malignant neoplasm of breast: Secondary | ICD-10-CM | POA: Diagnosis not present

## 2019-06-26 ENCOUNTER — Encounter: Payer: Self-pay | Admitting: Family Medicine

## 2019-06-26 ENCOUNTER — Encounter: Payer: Self-pay | Admitting: Adult Health

## 2019-06-27 ENCOUNTER — Other Ambulatory Visit: Payer: Self-pay | Admitting: Adult Health

## 2019-06-27 MED ORDER — BACLOFEN 10 MG PO TABS: 10.0000 mg | ORAL_TABLET | Freq: Every day | ORAL | 3 refills | Status: DC

## 2019-06-27 MED ORDER — ESCITALOPRAM OXALATE 10 MG PO TABS: 10.0000 mg | ORAL_TABLET | Freq: Every day | ORAL | 3 refills | Status: DC

## 2019-07-01 DIAGNOSIS — U071 COVID-19: Secondary | ICD-10-CM

## 2019-07-01 HISTORY — DX: COVID-19: U07.1

## 2019-07-03 ENCOUNTER — Encounter: Payer: Self-pay | Admitting: Family Medicine

## 2019-07-03 DIAGNOSIS — M79673 Pain in unspecified foot: Secondary | ICD-10-CM

## 2019-07-03 DIAGNOSIS — I69354 Hemiplegia and hemiparesis following cerebral infarction affecting left non-dominant side: Secondary | ICD-10-CM

## 2019-07-03 DIAGNOSIS — W19XXXA Unspecified fall, initial encounter: Secondary | ICD-10-CM

## 2019-07-04 ENCOUNTER — Encounter: Payer: Medicare Other | Attending: Registered Nurse | Admitting: Physical Medicine & Rehabilitation

## 2019-07-04 ENCOUNTER — Other Ambulatory Visit: Payer: Self-pay

## 2019-07-04 ENCOUNTER — Encounter: Payer: Self-pay | Admitting: Physical Medicine & Rehabilitation

## 2019-07-04 VITALS — BP 136/70 | HR 68 | Ht 66.0 in | Wt 180.0 lb

## 2019-07-04 DIAGNOSIS — I10 Essential (primary) hypertension: Secondary | ICD-10-CM

## 2019-07-04 DIAGNOSIS — I639 Cerebral infarction, unspecified: Secondary | ICD-10-CM | POA: Insufficient documentation

## 2019-07-04 DIAGNOSIS — E1169 Type 2 diabetes mellitus with other specified complication: Secondary | ICD-10-CM | POA: Diagnosis not present

## 2019-07-04 DIAGNOSIS — E669 Obesity, unspecified: Secondary | ICD-10-CM

## 2019-07-04 DIAGNOSIS — S8292XA Unspecified fracture of left lower leg, initial encounter for closed fracture: Secondary | ICD-10-CM | POA: Diagnosis not present

## 2019-07-04 DIAGNOSIS — I69354 Hemiplegia and hemiparesis following cerebral infarction affecting left non-dominant side: Secondary | ICD-10-CM

## 2019-07-04 DIAGNOSIS — R269 Unspecified abnormalities of gait and mobility: Secondary | ICD-10-CM | POA: Diagnosis not present

## 2019-07-04 DIAGNOSIS — E7849 Other hyperlipidemia: Secondary | ICD-10-CM | POA: Insufficient documentation

## 2019-07-04 NOTE — Progress Notes (Signed)
Subjective:    Patient ID: Julia Banks, female    DOB: 09-Feb-1941, 79 y.o.   MRN: [id number]  TELEHEALTH NOTE  Due to national recommendations of social distancing due to COVID 19, an audio/video telehealth visit is felt to be most appropriate for this patient at this time.  See Chart message from today for the patient's consent to telehealth from [place].     I verified that I am speaking with the correct person using two identifiers.  Location of patient: Home Location of provider: Office Method of communication: Webex transitioned to telephone due to Internet issues Names of participants : Zorita Pang scheduling, Marland Mcalpine obtaining consent and vitals if available Established patient Time spent on call: 18 minutes  HPI Right-handed female with history of hypertension, hyperlipidemia, diabetes mellitus, CAD, CKD stage III presents for follow-up for acute perforator infarct at the right basal ganglia corona radiata.  She was last seen in clinic on 04/27/2019 by NP.  Family supplements history. Notes reviewed.  Since that time she also follow-up with her PCP and neurology, notes reviewed-he was started on baclofen.  She has been wearing orthosis at night. She is on ASA alone. She states BPs have been controlled. CBGs are improving. She is having movements without difficulty. She continues to have a persistent cough. She completed therapies, doing HEP. She fell ~1 month ago, fracturing her left foot, trying to get her dog. She is in a brace, following up with Ortho.   Pain Inventory Average Pain 5 Pain Right Now 5 My pain is intermittent, sharp and stabbing  In the last 24 hours, has pain interfered with the following? General activity 5 Relation with others 5 Enjoyment of life 5 What TIME of day is your pain at its worst? all Sleep (in general) Good  Pain is worse with: some activites Pain improves with: rest Relief from Meds: 0  Mobility ability to climb steps?  no do you drive?  no use a wheelchair  Function retired  Neuro/Psych No problems in this area  Prior Studies Any changes since last visit?  yes  Physicians involved in your care Any changes since last visit?  no   Family History  Problem Relation Age of Onset  . Arthritis Mother   . Hyperlipidemia Mother   . Stroke Mother   . Hypertension Mother   . Diabetes Mother   . Other Mother        parathyroid adenoma  . Arthritis Father   . Hyperlipidemia Father   . CAD Father        MI  . Hypertension Father   . Breast cancer Other   . Ovarian cancer Other   . Leukemia Brother        dx in his 30s  . Colon cancer Maternal Uncle        dx in his 72s  . Ovarian cancer Maternal Grandmother 90  . Melanoma Other 27       dx 2x - 28 and 29/dx 3x - ages 67, 77, and 61  . Cancer Neg Hx   . Crohn's disease Neg Hx   . Colon polyps Neg Hx   . Ulcerative colitis Neg Hx   . Stomach cancer Neg Hx   . Rectal cancer Neg Hx    Social History   Socioeconomic History  . Marital status: Widowed    Spouse name: Not on file  . Number of children: 0  . Years  of education: Not on file  . Highest education level: Not on file  Occupational History  . Not on file  Tobacco Use  . Smoking status: Never Smoker  . Smokeless tobacco: Never Used  . Tobacco comment: second hand exposure  Substance and Sexual Activity  . Alcohol use: No  . Drug use: No  . Sexual activity: Not on file  Other Topics Concern  . Not on file  Social History Narrative   Widow, husband Fritz Pickerel passed 3.  Has 3 dogs   No children. Niece [name] supportive.    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ist, nutritional   Edu: 1 yr college   Activity: stationary bicycle, some walking   Diet: good water, fruits/vegetables daily      G0P0   Social Determinants of Radio broadcast assistant Strain: Low Risk   . Difficulty of Paying Living Expenses:  Not hard at all  Food Insecurity: No Food Insecurity  . Worried About Charity fundraiser in the Last Year: Never true  . Ran Out of Food in the Last Year: Never true  Transportation Needs: No Transportation Needs  . Lack of Transportation (Medical): No  . Lack of Transportation (Non-Medical): No  Physical Activity:   . Days of Exercise per Week: Not on file  . Minutes of Exercise per Session: Not on file  Stress:   . Feeling of Stress : Not on file  Social Connections:   . Frequency of Communication with Friends and Family: Not on file  . Frequency of Social Gatherings with Friends and Family: Not on file  . Attends Religious Services: Not on file  . Active Member of Clubs or Organizations: Not on file  . Attends Archivist Meetings: Not on file  . Marital Status: Not on file   Past Surgical History:  Procedure Laterality Date  . ABDOMINAL HYSTERECTOMY     heavy bleeding, ovaries remain  . APPENDECTOMY    . BREAST BIOPSY Right    benign  . BREAST BIOPSY Left 02/2014   ?DCIS  . CARDIAC CATHETERIZATION  2006   [place], [place].   . CARDIAC CATHETERIZATION  10/2013   mild 2v nonobstructive disease, EF 60%  . CARDIOVASCULAR STRESS TEST  09/2012   WNL ([place])  . CHOLECYSTECTOMY    . COLONOSCOPY  05/2010   2 TAs, diverticulosis, rpt 2 yrs (Dr Elissa Hefty in [place])  . COLONOSCOPY  12/2014   1 TA, rpt 5 yrs Henrene Pastor)  . cortisone right knee  01/04/15  . GALLBLADDER SURGERY    . MASTECTOMY W/ SENTINEL NODE BIOPSY Left 06/07/2014   DR WAKEFIELD  . SIMPLE MASTECTOMY WITH AXILLARY SENTINEL NODE BIOPSY Left 06/07/2014   Procedure: LEFT TOTAL MASTECTOMY WITH LEFT AXILLARY SENTINEL NODE BIOPSY;  Surgeon: Rolm Bookbinder, MD;  Location: [facility];  Service: General;  Laterality: Left;   Past Medical History:  Diagnosis Date  . Arthritis   . Asthma   . Breast cancer, left breast ([place]) 02/2014   DCIS, ER+ PR- initially rec bilat mastectomy with sentinal LN biopsy L axilla but now  planning just L mastectomy [name])  . Chronic kidney disease, stage 3   . Colon polyps    benign  . Complication of anesthesia    hard to wake up  . Coronary artery disease    30% stenosis reported on previous cardiac catheterization in 2006. This was done in [facility]  . GERD (gastroesophageal reflux disease)   .  Headaches, cluster   . Hyperlipidemia   . Hypertension   . Hypothyroidism   . Osteopenia 07/2014   DEXA 0.5 spine, 01.7 R femoral neck  . Pneumonia    years ago  . Secondary hyperparathyroidism of renal origin ([place])    likely  . Sensorineural hearing loss (SNHL) of both ears 01/27/2016   Audiology eval - moderately severe sensorineural hearing loss bilaterally rec amplification (07/2016 - [name] audiologist)  . Sleep apnea    on CPAP  . Thyroid disease   . Type 2 diabetes, uncontrolled, with neuropathy ([place])    followed by endo Dr Buddy Duty   BP 136/70   Pulse 68   Ht 5\' 6"  (1.676 m)   Wt 180 lb (81.6 kg)   BMI 29.05 kg/m   Opioid Risk Score:   Fall Risk Score:  `1  Depression screen PHQ 2/9  Depression screen [facility] 2/9 05/30/2019 05/05/2019 04/09/2018 01/26/2017 01/25/2016 10/10/2014 06/28/2013  Decreased Interest 0 0 0 0 0 0 0  Down, Depressed, Hopeless 0 0 0 0 0 0 0  PHQ - 2 Score 0 0 0 0 0 0 0  Some recent data might be hidden    Review of Systems  Constitutional: Negative.   HENT: Negative.   Eyes: Negative.   Respiratory: Negative.   Cardiovascular: Negative.   Gastrointestinal: Negative.   Endocrine: Negative.   Genitourinary: Negative.   Musculoskeletal: Positive for arthralgias.  Skin: Negative.   Allergic/Immunologic: Negative.   Neurological: Positive for weakness.  Hematological: Negative.   Psychiatric/Behavioral: Positive for confusion.       Objective:   Physical Exam Constitutional: No distress  Respiratory: Normal effort. Psych: Normal mood.  Normal behavior. Neuro: Alert    Assessment & Plan:   Right-handed female with history of hypertension, hyperlipidemia, diabetes mellitus, CAD, CKD stage III presents for follow-up for acute perforator infarct at the right basal ganglia corona radiata.  1.  Left-sided hemiparesis with spasticity secondary to acute perforator infarct at the right basal ganglia corona radiata onset 03/07/2019.    Cont HEP  PRAFO/WHO nightly  Cont Baclofen per Neurology  Cont follow up with Neurology  Would benefit from therapies once cleared by Ortho  2.  Hypertension.      Cont meds  Appears to be improving  3. Diabetes mellitus with hyperglycemia.    Cont meds  Improving per patient  4.  ?Left fibular fracture  Cont follow up with Ortho - limited to stand pivots due to pain  5. Gait abnormality  Cont wheelchair due to fracture, using hemi-walker previously  Will order therapies once cleared by Ortho

## 2019-07-06 ENCOUNTER — Encounter: Payer: Self-pay | Admitting: Family Medicine

## 2019-07-07 NOTE — Telephone Encounter (Signed)
Requesting new HH eval given ongoing falls and foot injury. I last saw pt 05/30/2019.

## 2019-07-13 ENCOUNTER — Encounter: Payer: Self-pay | Admitting: Family Medicine

## 2019-07-13 NOTE — Telephone Encounter (Signed)
First day of symptoms 07/03/2019. Largely improving 10 days in - which is reassuring. See pt mychart message.

## 2019-07-25 ENCOUNTER — Encounter: Payer: Self-pay | Admitting: General Practice

## 2019-07-26 ENCOUNTER — Encounter: Payer: Self-pay | Admitting: Adult Health

## 2019-07-26 ENCOUNTER — Ambulatory Visit: Payer: Medicare Other | Admitting: Internal Medicine

## 2019-07-26 DIAGNOSIS — E1169 Type 2 diabetes mellitus with other specified complication: Secondary | ICD-10-CM

## 2019-07-26 DIAGNOSIS — E785 Hyperlipidemia, unspecified: Secondary | ICD-10-CM

## 2019-07-27 ENCOUNTER — Encounter: Payer: Self-pay | Admitting: Adult Health

## 2019-07-27 ENCOUNTER — Other Ambulatory Visit: Payer: Self-pay

## 2019-07-27 ENCOUNTER — Ambulatory Visit (INDEPENDENT_AMBULATORY_CARE_PROVIDER_SITE_OTHER): Payer: Medicare Other | Admitting: Adult Health

## 2019-07-27 VITALS — BP 153/66 | HR 60 | Temp 98.3°F | Ht 66.0 in

## 2019-07-27 DIAGNOSIS — F0151 Vascular dementia with behavioral disturbance: Secondary | ICD-10-CM | POA: Diagnosis not present

## 2019-07-27 DIAGNOSIS — G8114 Spastic hemiplegia affecting left nondominant side: Secondary | ICD-10-CM

## 2019-07-27 DIAGNOSIS — R413 Other amnesia: Secondary | ICD-10-CM | POA: Diagnosis not present

## 2019-07-27 DIAGNOSIS — I10 Essential (primary) hypertension: Secondary | ICD-10-CM

## 2019-07-27 DIAGNOSIS — F01518 Vascular dementia, unspecified severity, with other behavioral disturbance: Secondary | ICD-10-CM

## 2019-07-27 DIAGNOSIS — E1122 Type 2 diabetes mellitus with diabetic chronic kidney disease: Secondary | ICD-10-CM | POA: Diagnosis not present

## 2019-07-27 DIAGNOSIS — E785 Hyperlipidemia, unspecified: Secondary | ICD-10-CM | POA: Diagnosis not present

## 2019-07-27 DIAGNOSIS — G4733 Obstructive sleep apnea (adult) (pediatric): Secondary | ICD-10-CM | POA: Diagnosis not present

## 2019-07-27 DIAGNOSIS — I63511 Cerebral infarction due to unspecified occlusion or stenosis of right middle cerebral artery: Secondary | ICD-10-CM | POA: Diagnosis not present

## 2019-07-27 DIAGNOSIS — Z9989 Dependence on other enabling machines and devices: Secondary | ICD-10-CM

## 2019-07-27 DIAGNOSIS — N183 Chronic kidney disease, stage 3 unspecified: Secondary | ICD-10-CM | POA: Diagnosis not present

## 2019-07-27 MED ORDER — DIVALPROEX SODIUM 125 MG PO CSDR: 125.0000 mg | DELAYED_RELEASE_CAPSULE | Freq: Every day | ORAL | 3 refills | Status: DC

## 2019-07-27 NOTE — Patient Instructions (Addendum)
Worsening cognition with behavioral concerns unknown etiology at this time but will check for reversible causes lab work and EEG as well as obtain MRI to ensure no new stroke or abnormality especially with recent Covid infection.  At times, cognition can worsen with lack of physical activity or deconditioning and would recommend initiating therapy once cleared by orthopedics due to ongoing stroke deficits.  Start depakote 125mg  daily for behaviors likely due to vascular cognitive impairment  MRI of brain and EEG to look for any reason for worsening memory especially with recent COVID infection  Check lab work including dementia panel, CBC, CMP (includes hepatic function panel) and lipid panel  Continue baclofen 10 mg nightly -reach out to Dr. Posey Pronto and physical medicine and rehab for possible benefit of Botox injections for ongoing spasticity  Continue aspirin 81 mg daily  and pravastatin  for secondary stroke prevention  Continue to follow up with PCP regarding cholesterol, diabetes and blood pressure management   Continue to monitor blood pressure at home  Maintain strict control of hypertension with blood pressure goal below 130/90, diabetes with hemoglobin A1c goal below 6.5% and cholesterol with LDL cholesterol (bad cholesterol) goal below 70 mg/dL. I also advised the patient to eat a healthy diet with plenty of whole grains, cereals, fruits and vegetables, exercise regularly and maintain ideal body weight.  Followup in the future with me in 2 months or call earlier if needed       Thank you for coming to see [place] at [facility]. I hope we have been able to provide you high quality care today.  You may receive a patient satisfaction survey over the next few weeks. We would appreciate your feedback and comments so that we may continue to improve ourselves and the health of our patients.   Valproic Acid, Divalproex Sodium sprinkle capsule What is this  medicine? DIVALPROEX SODIUM (dye VAL pro ex SO dee um) is used to treat certain types of seizures in patients with epilepsy. This medicine may be used for other purposes; ask your health care provider or pharmacist if you have questions. COMMON BRAND NAME(S): Depakote What should I tell my health care provider before I take this medicine? They need to know if you have any of these conditions:  if you often drink alcohol  kidney disease  liver disease  low platelet counts  mitochondrial disease  suicidal thoughts, plans, or attempt; a previous suicide attempt by you or a family member  urea cycle disorder (UCD)  an unusual or allergic reaction to divalproex sodium, sodium valproate, valproic acid, other medicines, foods, dyes, or preservatives  pregnant or trying to get pregnant  breast-feeding How should I use this medicine? Take this medicine by mouth. It can be swallowed whole or the capsules may be opened carefully and the contents sprinkled on about a teaspoonful of applesauce or pudding. This mixture must be swallowed immediately. Do not cut, crush or chew this medicine. You can take it with or without food. If it upsets your stomach, take it with food. Follow the directions on the prescription label. Take your medicine at regular intervals. Do not take it more often than directed. Do not stop taking except on your doctor's advice. A special MedGuide will be given to you by the pharmacist with each prescription and refill. Be sure to read this information carefully each time. Talk to your pediatrician regarding the use of this medicine in children. While this drug may be prescribed for children as  young as 10 years for selected conditions, precautions do apply. Overdosage: If you think you have taken too much of this medicine contact a poison control center or emergency room at once. NOTE: This medicine is only for you. Do not share this medicine with others. What if I miss a  dose? If you miss a dose, take it as soon as you can. If it is almost time for your next dose, take only that dose. Do not take double or extra doses. What may interact with this medicine? Do not take this medicine with any of the following medications:  sodium phenylbutyrate This medicine may also interact with the following medications:  aspirin  certain antibiotics like ertapenem, imipenem, meropenem  certain medicines for depression, anxiety, or psychotic disturbances  certain medicines for seizures like carbamazepine, clonazepam, diazepam, ethosuximide, felbamate, lamotrigine, phenobarbital, phenytoin, primidone, rufinamide, topiramate  certain medicines that treat or prevent blood clots like warfarin  cholestyramine  female hormones, like estrogens and birth control pills, patches, or rings  propofol  rifampin  ritonavir  tolbutamide  zidovudine This list may not describe all possible interactions. Give your health care provider a list of all the medicines, herbs, non-prescription drugs, or dietary supplements you use. Also tell them if you smoke, drink alcohol, or use illegal drugs. Some items may interact with your medicine. What should I watch for while using this medicine? Tell your doctor or health care provider if your symptoms do not get better or they start to get worse. This medicine may cause serious skin reactions. They can happen weeks to months after starting the medicine. Contact your health care provider right away if you notice fevers or flu-like symptoms with a rash. The rash may be red or purple and then turn into blisters or peeling of the skin. Or, you might notice a red rash with swelling of the face, lips or lymph nodes in your neck or under your arms. Wear a medical ID bracelet or chain, and carry a card that describes your disease and details of your medicine and dosage times. You may get drowsy, dizzy, or have blurred vision. Do not drive, use  machinery, or do anything that needs mental alertness until you know how this medicine affects you. To reduce dizzy or fainting spells, do not sit or stand up quickly, especially if you are an older patient. Alcohol can increase drowsiness and dizziness. Avoid alcoholic drinks. This medicine can make you more sensitive to the sun. Keep out of the sun. If you cannot avoid being in the sun, wear protective clothing and use sunscreen. Do not use sun lamps or tanning beds/booths. Patients and their families should watch out for new or worsening depression or thoughts of suicide. Also watch out for sudden changes in feelings such as feeling anxious, agitated, panicky, irritable, hostile, aggressive, impulsive, severely restless, overly excited and hyperactive, or not being able to sleep. If this happens, especially at the beginning of treatment or after a change in dose, call your health care provider. Women should inform their doctor if they wish to become pregnant or think they might be pregnant. There is a potential for serious side effects to an unborn child. Talk to your health care provider or pharmacist for more information. Women who become pregnant while using this medicine may enroll in the [place] Pregnancy Registry by calling [phone]. This registry collects information about the safety of antiepileptic drug use during pregnancy. This medicine may cause a decrease in folic  acid and vitamin D. You should make sure that you get enough vitamins while you are taking this medicine. Discuss the foods you eat and the vitamins you take with your health care provider. What side effects may I notice from receiving this medicine? Side effects that you should report to your doctor or health care professional as soon as possible:  allergic reactions like skin rash, itching or hives, swelling of the face, lips, or tongue  changes in vision  rash, fever, and swollen lymph  nodes  redness, blistering, peeling or loosening of the skin, including inside the mouth  signs and symptoms of liver injury like dark yellow or brown urine; general ill feeling or flu-like symptoms; light-colored stools; loss of appetite; nausea; right upper belly pain; unusually weak or tired; yellowing of the eyes or skin  suicidal thoughts or other mood changes  unusual bleeding or bruising Side effects that usually do not require medical attention (report to your doctor or health care professional if they continue or are bothersome):  constipation  diarrhea  dizziness  hair loss  headache  loss of appetite  weight gain This list may not describe all possible side effects. Call your doctor for medical advice about side effects. You may report side effects to FDA at 1-800-FDA-1088. Where should I keep my medicine? Keep out of reach of children. Store at room temperature below 25 degrees C (77 degrees F). Keep container tightly closed. Throw away any unused medicine after the expiration date. NOTE: This sheet is a summary. It may not cover all possible information. If you have questions about this medicine, talk to your doctor, pharmacist, or health care provider.  2020 [employer] Standard (2018-09-24 16:10:00)

## 2019-07-27 NOTE — Progress Notes (Signed)
[facility] [address] [place]. [place] [address] [phone]       STROKE FOLLOW UP NOTE  [name] Date of Birth:  Jun 10, 1941 Medical Record Number:  [id number]   Reason for Referral: stroke follow up    CHIEF COMPLAINT:  Chief Complaint  Patient presents with  . Follow-up    TxRM. with nieces husband. Husband states his wife sent a message to [place]. Memory is declining. Sometimes stares off into space     HPI:  Stroke admission 03/07/2019: [name] is a 79 y.o. female with history of iabetes, thyroid disease, sensorineural hearing loss, hypothyroidism, hypertension, hyperlipidemia, cluster headaches, CAD, CKD stage III  who presented on 03/07/2019 with left-sided weakness and decreased sensation.  Stroke work-up showed right AchA territory infarct as evidenced on MRI most likely secondary to small vessel disease however embolic event cannot be ruled out given AchA is coming out directly from carotid artery.  CTA head/neck negative E LVO but did show arthrosclerosis.  MRI showed right basal ganglia and corona radiata infarct at right AchA territory.  2D echo showed an EF of 60 to 65%.  Recommended 30-day cardiac event monitor outpatient to rule out A. fib due to recent stroke and history of intermittent heart palpitations.  LDL 144.  A1c 7.2.  Recommended DAPT for 3 weeks and aspirin alone.  HTN stable.  History of statin intolerance and recommended PCP follow-up for continued Repatha.  Uncontrolled DM and recommend close PCP follow-up.  Other stroke risk factors include advanced age, family history of stroke, CAD and OSA on CPAP.  Other active problems include history of left breast cancer status postmastectomy 2015,  CKD stage III,  Hypothyroidism, secondary hyperparathyroidism and sensorineural hearing loss bilaterally.  Residual deficits of left hemiparesis and discharged to CIR for ongoing therapy.  Initial visit 05/05/2019: [name] is  a 79 year old female who is being seen today for hospital follow-up accompanied by her niece.  Residual deficits of left-sided weakness and dysarthria.  Continues to participate with home health PT/OT with ongoing improvement.  Currently in a wheelchair which she uses for long distance but is able to ambulate short distances with hemiwalker.  She does endorse increased pain left upper and lower extremity with occasional spasms.  She is also been experiencing headaches since discharge that can occur frontal and occipital sometimes radiating up from her neck.  Headaches have been improving and will use Tylenol with benefit.  Her niece has moved in with her to assist with ADLs and ongoing care.  She has completed 3 weeks DAPT and continues on aspirin alone without bleeding or bruising.  She does have scheduled lipid clinic evaluation in January to initiate Repatha due to history of statin intolerance as well as Zetia intolerance.  Start a 30-day cardiac event monitor on 04/30/1929.  Blood pressure today 164/73.  Monitors at home and typically ranges 140s-180s/60s-70s.  Continues to follow with PCP and cardiology with ongoing adjustment to antihypertensives.  No further concerns at this time.  Denies new or worsening stroke/TIA symptoms.  Update 07/27/2019: [name] is a 79 year old female who is being seen today by request of her niece due to worsening cognition and behavioral concerns.  Patient is accompanied today by nieces son.  Family started to observe worsening memory approximately 4 weeks ago and did worsen even further with recent diagnosis of Covid approximately 2 to 3 weeks ago. She also unfortunately had a fall around 05/2019 resulting in left fibular  fracture therefore has not been able to precipitate in any therapies as she is nonweightbearing at this time.  Family member does state memory/confusion is typically worse in the evening along with worsening of dysarthria.  Daughter sent my chart message  which prompted today's visit as she has been hallucinating, increased agitation and irritability especially after recent fall last week.  She did not seek medical evaluation after fall.  She is fatigued majority of the day but sleeps well at night.  Residual stroke deficits of dysarthria and left spastic hemiparesis with slight worsening of LLE due to recent fracture.  She does continue to follow with physical medicine and rehab and continues on baclofen 10 mg nightly.  She does continue to complain of pain due to spasticity.  Continues on aspirin 81 mg daily and pravastatin 40 mg daily for secondary stroke prevention effects.  Blood pressure today 153/66.  No further concerns at this time.      ROS:   14 system review of systems performed and negative with exception of memory loss, confusion, weakness, speech difficulty, pain, fatigue  PMH:  Past Medical History:  Diagnosis Date  . Arthritis   . Asthma   . Breast cancer, left breast ([place]) 02/2014   DCIS, ER+ PR- initially rec bilat mastectomy with sentinal LN biopsy L axilla but now planning just L mastectomy [name])  . Chronic kidney disease, stage 3   . Colon polyps    benign  . Complication of anesthesia    hard to wake up  . Coronary artery disease    30% stenosis reported on previous cardiac catheterization in 2006. This was done in [facility]  . COVID-19 virus infection 07/2019  . GERD (gastroesophageal reflux disease)   . Headaches, cluster   . Hyperlipidemia   . Hypertension   . Hypothyroidism   . Osteopenia 07/2014   DEXA 0.5 spine, 01.7 R femoral neck  . Pneumonia    years ago  . Secondary hyperparathyroidism of renal origin ([place])    likely  . Sensorineural hearing loss (SNHL) of both ears 01/27/2016   Audiology eval - moderately severe sensorineural hearing loss bilaterally rec amplification (07/2016 - [name] audiologist)  . Sleep apnea    on CPAP  . Thyroid disease   . Type 2  diabetes, uncontrolled, with neuropathy ([place])    followed by endo Dr Buddy Duty    PSH:  Past Surgical History:  Procedure Laterality Date  . ABDOMINAL HYSTERECTOMY     heavy bleeding, ovaries remain  . APPENDECTOMY    . BREAST BIOPSY Right    benign  . BREAST BIOPSY Left 02/2014   ?DCIS  . CARDIAC CATHETERIZATION  2006   [place], [place].   . CARDIAC CATHETERIZATION  10/2013   mild 2v nonobstructive disease, EF 60%  . CARDIOVASCULAR STRESS TEST  09/2012   WNL ([place])  . CHOLECYSTECTOMY    . COLONOSCOPY  05/2010   2 TAs, diverticulosis, rpt 2 yrs (Dr Elissa Hefty in [place])  . COLONOSCOPY  12/2014   1 TA, rpt 5 yrs Henrene Pastor)  . cortisone right knee  01/04/15  . GALLBLADDER SURGERY    . MASTECTOMY W/ SENTINEL NODE BIOPSY Left 06/07/2014   DR WAKEFIELD  . SIMPLE MASTECTOMY WITH AXILLARY SENTINEL NODE BIOPSY Left 06/07/2014   Procedure: LEFT TOTAL MASTECTOMY WITH LEFT AXILLARY SENTINEL NODE BIOPSY;  Surgeon: Rolm Bookbinder, MD;  Location: [facility];  Service: General;  Laterality: Left;    Social History:  Social History   Socioeconomic History  . Marital status: Widowed    Spouse name: Not on file  . Number of children: 0  . Years of education: Not on file  . Highest education level: Not on file  Occupational History  . Not on file  Tobacco Use  . Smoking status: Never Smoker  . Smokeless tobacco: Never Used  . Tobacco comment: second hand exposure  Substance and Sexual Activity  . Alcohol use: No  . Drug use: No  . Sexual activity: Not on file  Other Topics Concern  . Not on file  Social History Narrative   Widow, husband Fritz Pickerel passed 35.  Has 3 dogs   No children. Niece [name] supportive.    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, nutritional   Edu: 1 yr college   Activity: stationary bicycle, some walking   Diet: good water, fruits/vegetables daily      G0P0   Social Determinants of Radio broadcast assistant Strain: Low Risk   .  Difficulty of Paying Living Expenses: Not hard at all  Food Insecurity: No Food Insecurity  . Worried About Charity fundraiser in the Last Year: Never true  . Ran Out of Food in the Last Year: Never true  Transportation Needs: No Transportation Needs  . Lack of Transportation (Medical): No  . Lack of Transportation (Non-Medical): No  Physical Activity:   . Days of Exercise per Week: Not on file  . Minutes of Exercise per Session: Not on file  Stress:   . Feeling of Stress : Not on file  Social Connections:   . Frequency of Communication with Friends and Family: Not on file  . Frequency of Social Gatherings with Friends and Family: Not on file  . Attends Religious Services: Not on file  . Active Member of Clubs or Organizations: Not on file  . Attends Archivist Meetings: Not on file  . Marital Status: Not on file  Intimate Partner Violence:   . Fear of Current or Ex-Partner: Not on file  . Emotionally Abused: Not on file  . Physically Abused: Not on file  . Sexually Abused: Not on file    Family History:  Family History  Problem Relation Age of Onset  . Arthritis Mother   . Hyperlipidemia Mother   . Stroke Mother   . Hypertension Mother   . Diabetes Mother   . Other Mother        parathyroid adenoma  . Arthritis Father   . Hyperlipidemia Father   . CAD Father        MI  . Hypertension Father   . Breast cancer Other   . Ovarian cancer Other   . Leukemia Brother        dx in his 80s  . Colon cancer Maternal Uncle        dx in his 63s  . Ovarian cancer Maternal Grandmother 90  . Melanoma Other 27       dx 2x - 28 and 29/dx 3x - ages 56, 41, and 74  . Cancer Neg Hx   . Crohn's disease Neg Hx   . Colon polyps Neg Hx   . Ulcerative colitis Neg Hx   . Stomach cancer Neg Hx   . Rectal cancer Neg Hx     Medications:   Current Outpatient Medications on File Prior to Visit  Medication Sig Dispense Refill  . acetaminophen (TYLENOL) 325 MG tablet Take  2  tablets (650 mg total) by mouth 3 (three) times daily as needed for mild pain (or temp > 37.5 C (99.5 F)).    Marland Kitchen albuterol (PROVENTIL HFA;VENTOLIN HFA) 108 (90 BASE) MCG/ACT inhaler Inhale into the lungs every 6 (six) hours as needed for wheezing or shortness of breath.    Marland Kitchen amLODipine (NORVASC) 10 MG tablet Take 1 tablet (10 mg total) by mouth daily. 90 tablet 3  . Ascorbic Acid (VITAMIN C WITH ROSE HIPS) 500 MG tablet Take 500 mg by mouth daily.    Marland Kitchen aspirin EC 81 MG EC tablet Take 1 tablet (81 mg total) by mouth daily. 30 tablet 2  . baclofen (LIORESAL) 10 MG tablet Take 1 tablet (10 mg total) by mouth at bedtime. 30 each 3  . carvedilol (COREG) 6.25 MG tablet Take 1 tablet (6.25 mg total) by mouth 2 (two) times daily. 180 tablet 3  . Cholecalciferol (VITAMIN D PO) Take 5,000 Units by mouth.    . docusate sodium (COLACE) 100 MG capsule Take 200 mg by mouth daily.     Marland Kitchen escitalopram (LEXAPRO) 10 MG tablet Take 1 tablet (10 mg total) by mouth daily. 90 tablet 3  . Evolocumab (REPATHA SURECLICK) [id number] MG/ML SOAJ Inject 1 pen into the skin every 14 (fourteen) days. 2 pen 5  . famotidine (PEPCID) 20 MG tablet Take 1 tablet (20 mg total) by mouth at bedtime. 90 tablet 3  . Fluticasone-Salmeterol (ADVAIR) 250-50 MCG/DOSE AEPB Inhale 1 puff into the lungs 2 (two) times daily. 60 each 11  . hydrochlorothiazide (MICROZIDE) 12.5 MG capsule Take 1 capsule (12.5 mg total) by mouth daily. 90 capsule 3  . Insulin Glargine (LANTUS) 100 UNIT/ML Solostar Pen Inject 42 Units into the skin at bedtime. 45 mL 3  . Insulin Pen Needle (BD PEN NEEDLE NANO U/F) 32G X 4 MM MISC USE 1 AS DIRECTED TO INJECT MEDICATION DAILY. 100 each 3  . levothyroxine (SYNTHROID) 25 MCG tablet Take one tablet daily, take 2 tablets on Monday, wednesday, friday 120 tablet 3  . losartan (COZAAR) 100 MG tablet Take 1 tablet (100 mg total) by mouth daily. 90 tablet 3  . Omega-3 Fatty Acids (OMEGA 3 PO) Take 1 capsule by mouth daily. 180 EPA/120DHA      . pravastatin (PRAVACHOL) 40 MG tablet Take 1 tablet (40 mg total) by mouth daily. 90 tablet 3  . vitamin B-12 (CYANOCOBALAMIN) 1000 MCG tablet Take 1,000 mcg by mouth daily.     No current facility-administered medications on file prior to visit.    Allergies:   Allergies  Allergen Reactions  . Altace [Ramipril] Other (See Comments)    Upset stomach  . Aspirin Nausea And Vomiting  . Metformin And Related Other (See Comments)    GI upset     Physical Exam  Vitals:   07/27/19 1249  BP: (!) 153/66  Pulse: 60  Temp: 98.3 F (36.8 C)  Height: 5\' 6"  (1.676 m)   Body mass index is 29.05 kg/m. No exam data present  Depression screen [facility] 2/9 05/30/2019  Decreased Interest 0  Down, Depressed, Hopeless 0  PHQ - 2 Score 0  Some recent data might be hidden     General: well developed, well nourished,  pleasant elderly Caucasian female, seated, in no evident distress Head: head normocephalic and atraumatic.   Neck: supple with no carotid or supraclavicular bruits Cardiovascular: regular rate and rhythm, no murmurs Musculoskeletal: no deformity Skin:  no rash/petichiae Vascular:  Normal  pulses all extremities   Neurologic Exam Mental Status: Awake and fully alert.   Mild dysarthria.  Oriented to place and year only. Mood and affect appropriate.  MMSE - Mini Mental State Exam 07/27/2019  Orientation to time 2  Orientation to Place 5  Registration 3  Attention/ Calculation 0  Recall 2  Language- name 2 objects 2  Language- repeat 1  Language- follow 3 step command 3  Language- read & follow direction 1  Write a sentence 1  Copy design 1  Total score 21   Cranial Nerves: Pupils equal, briskly reactive to light. Extraocular movements full without nystagmus. Visual fields full to confrontation. Hearing intact. Facial sensation intact.  Mild left lower facial weakness Motor:  LUE: 3/5 with weak grip strength with increased spasticity LLE: 4/5 except 3/5 hip flexor.   Unable to test dorsiflexion or plantarflexion due to ankle fracture.   Sensory.:  Sensation intact Coordination: Rapid alternating movements normal in all extremities except decreased left hand. Finger-to-nose and heel-to-shin performed accurately on right side. Gait and Station: Deferred as in wheelchair Reflexes: 1+ and symmetric. Toes downgoing.     Diagnostic Data (Labs, Imaging, Testing)  CT HEAD WO CONTRAST 03/07/2019 IMPRESSION: 1. No acute finding. 2. ASPECTS is 10.  CT ANGIO HEAD W OR WO CONTRAST CT ANGIO NECK W OR WO CONTRAST 03/07/2019 IMPRESSION: 1. No emergent finding. 2. Atherosclerosis without flow limiting stenosis in the head and neck.  MR BRAIN WO CONTRAST 03/07/2019 IMPRESSION: Acute perforator infarct at the right basal ganglia and corona radiata.  ECHOCARDIOGRAM 03/08/2019 IMPRESSIONS  1. The left ventricle has normal systolic function with an ejection fraction of 60-65%. The cavity size was normal. There is mildly increased left ventricular wall thickness. Left ventricular diastolic Doppler parameters are consistent with impaired  relaxation.  2. The aortic root is normal in size and structure.  3. The aortic valve is tricuspid. No stenosis of the aortic valve.  4. The right ventricle has normal systolic function. The cavity was normal. There is no increase in right ventricular wall thickness.  5. No evidence of mitral valve stenosis. No significant mitral regurgitation.  6. The IVC was normal in size. No complete TR doppler jet so unable to estimate PA systolic pressure.    ASSESSMENT: Julia Banks is a 79 y.o. year old female presented with left-sided weakness and decreased sensation on 03/07/2019 with stroke work-up revealing right basal ganglia and corona radiata infarct at right AchA territory most likely secondary to small vessel disease however embolic source cannot be ruled out.  30-day cardiac event monitor negative for A. fib.  Vascular risk  factors include HTN, HLD, DM, CAD and OSA on CPAP.  Being seen today for family request due to worsening cognition/memory over the past 4 weeks with worsening behaviors as well as recent left ankle fracture limiting mobility and receiving Covid infection.  Residual stroke deficits of left hemiparesis and dysarthria slightly worsened since prior visit    PLAN:  1. Worsening memory/cognition with behaviors: Likely due to vascular dementia/cognitive impairment with behaviors of paranoia, hallucinations and agitation.  Recommend initiating Depakote 125 mg nightly due to behavioral concerns.  Advised to continue Lexapro 10 mg daily for prior concern of depression and may consider changing in the future if indicated.  We will also obtain MRI brain wo contrast to rule out acute normality as well as EEG and lab work to assess for reversible causes.  Discussion with family regarding possible worsening due to deconditioning  with recent ankle fracture, recent infection with COVID-19 or underlying depression/anxiety but will assess for other causes.  We will repeat MMSE at follow-up visit in 2 months time.  Discussed potential side effects of Depakote and provided family with additional information 2. Right AchA territory stroke: Continue aspirin 81 mg daily and Repatha for secondary stroke prevention.  Maintain strict control of hypertension with blood pressure goal below 130/90, diabetes with hemoglobin A1c goal below 6.5% and cholesterol with LDL cholesterol (bad cholesterol) goal below 70 mg/dL.  I also advised the patient to eat a healthy diet with plenty of whole grains, cereals, fruits and vegetables, exercise regularly with at least 30 minutes of continuous activity daily and maintain ideal body weight. 3. Left hemiparesis and dysarthria, poststroke: Recommend continue to do exercises at home as able and to initiate therapies once cleared by orthopedics.  Continuation of baclofen 10 mg daily and may consider  increase in the future if indicated.  Advised to follow-up with physical medicine and rehab for possible benefit of Botox injections in order to avoid possible side effects with increasing baclofen dosage 4. HTN: Advised to continue current treatment regimen.  Advised to continue to monitor at home and follow-up with PCP/cardiology regarding need of management of uncontrolled blood pressures 5. HLD: History of statin and Zetia intolerance.  Initial evaluation by lipid clinic scheduled in January for initiation of Repatha 6. DMII: Advised to continue to monitor glucose levels at home along with continued follow-up with PCP for management and monitoring 7. OSA on CPAP: Continuation of compliance    Follow up in 2 months or call earlier if needed   Greater than 50% of time during this 30 minute visit was spent on counseling and discussion regarding recent worsening of memory loss with behaviors likely due to vascular cognitive impairment/dementia, explanation of diagnosis of right AchA territory stroke, reviewing risk factor management of HTN, HLD, DM and OSA on CPAP, discussion regarding residual deficits, planning of further management along with potential future management, and discussion with patient and family answering all questions.    Frann Rider, AGNP-BC  [facility] [address] [place], [place] [address]  Phone [phone] Fax [phone] Note: This document was prepared with digital dictation and possible smart phrase technology. Any transcriptional errors that result from this process are unintentional.

## 2019-07-27 NOTE — Progress Notes (Signed)
I agree with the above plan 

## 2019-07-28 LAB — DEMENTIA PANEL
Homocysteine: 19.6 umol/L — ABNORMAL HIGH (ref 0.0–19.2)
RPR Ser Ql: NONREACTIVE
TSH: 2.99 u[IU]/mL (ref 0.450–4.500)
Vitamin B-12: >2000 pg/mL — ABNORMAL HIGH (ref 232–1245)

## 2019-07-28 LAB — COMPREHENSIVE METABOLIC PANEL
ALT: 8 IU/L (ref 0–32)
AST: 11 IU/L (ref 0–40)
Albumin/Globulin Ratio: 1.6 (ref 1.2–2.2)
Albumin: 3.9 g/dL (ref 3.7–4.7)
Alkaline Phosphatase: 85 IU/L (ref 39–117)
BUN/Creatinine Ratio: 21 (ref 12–28)
BUN: 34 mg/dL — ABNORMAL HIGH (ref 8–27)
Bilirubin Total: 0.3 mg/dL (ref 0.0–1.2)
CO2: 23 mmol/L (ref 20–29)
Calcium: 9.6 mg/dL (ref 8.7–10.3)
Chloride: 107 mmol/L — ABNORMAL HIGH (ref 96–106)
Creatinine, Ser: 1.63 mg/dL — ABNORMAL HIGH (ref 0.57–1.00)
GFR calc Af Amer: 35 mL/min/{1.73_m2} — ABNORMAL LOW (ref >59)
GFR calc non Af Amer: 30 mL/min/{1.73_m2} — ABNORMAL LOW (ref >59)
Globulin, Total: 2.5 g/dL (ref 1.5–4.5)
Glucose: 130 mg/dL — ABNORMAL HIGH (ref 65–99)
Potassium: 3.5 mmol/L (ref 3.5–5.2)
Sodium: 145 mmol/L — ABNORMAL HIGH (ref 134–144)
Total Protein: 6.4 g/dL (ref 6.0–8.5)

## 2019-07-28 LAB — CBC WITH DIFFERENTIAL/PLATELET
Basophils Absolute: 0 10*3/uL (ref 0.0–0.2)
Basos: 0 %
EOS (ABSOLUTE): 0.6 10*3/uL — ABNORMAL HIGH (ref 0.0–0.4)
Eos: 6 %
Hematocrit: 34.8 % (ref 34.0–46.6)
Hemoglobin: 12 g/dL (ref 11.1–15.9)
Immature Grans (Abs): 0.1 10*3/uL (ref 0.0–0.1)
Immature Granulocytes: 1 %
Lymphocytes Absolute: 1.7 10*3/uL (ref 0.7–3.1)
Lymphs: 16 %
MCH: 30.2 pg (ref 26.6–33.0)
MCHC: 34.5 g/dL (ref 31.5–35.7)
MCV: 88 fL (ref 79–97)
Monocytes Absolute: 0.6 10*3/uL (ref 0.1–0.9)
Monocytes: 6 %
Neutrophils Absolute: 7.4 10*3/uL — ABNORMAL HIGH (ref 1.4–7.0)
Neutrophils: 71 %
Platelets: 198 10*3/uL (ref 150–450)
RBC: 3.97 x10E6/uL (ref 3.77–5.28)
RDW: 13.3 % (ref 11.7–15.4)
WBC: 10.3 10*3/uL (ref 3.4–10.8)

## 2019-07-28 LAB — LIPID PANEL
Chol/HDL Ratio: 3.8 ratio (ref 0.0–4.4)
Cholesterol, Total: 149 mg/dL (ref 100–199)
HDL: 39 mg/dL — ABNORMAL LOW (ref >39)
LDL Chol Calc (NIH): 73 mg/dL (ref 0–99)
Triglycerides: 221 mg/dL — ABNORMAL HIGH (ref 0–149)
VLDL Cholesterol Cal: 37 mg/dL (ref 5–40)

## 2019-08-01 ENCOUNTER — Telehealth: Payer: Self-pay | Admitting: *Deleted

## 2019-08-01 NOTE — Telephone Encounter (Signed)
-----   Message from Frann Rider, NP sent at 08/01/2019  9:43 AM EST ----- Please advise patient/daughter that her recent lab work showed some abnormalities:  Lipid panel -satisfactory LDL or bad cholesterol at 73 but triglycerides increased now at 221 with goal of less than 150.  Please advised to refrain from carbohydrates and sugary foods to help decrease level.  May need to consider initiating additional medication such as Zetia in the future if indicated.  Recommend repeat lab work in 6 to 8 weeks with PCP. Dementia panel -elevated B12 due to current use of B12 supplement.  Slightly elevated homocysteine level but this could be due to certain medications and slightly elevated kidney function.  It would be recommended to repeat in 6 to 8 weeks. Worsening kidney function and elevated sodium level - advised to follow-up with PCP for further monitoring and management   #No changes will be done at this time.  Recommend repeat lab work in 6 to 8 weeks and to follow-up with PCP as recommended above

## 2019-08-01 NOTE — Telephone Encounter (Signed)
LMVM for [name] to call back for lab results.

## 2019-08-08 ENCOUNTER — Other Ambulatory Visit: Payer: PRIVATE HEALTH INSURANCE

## 2019-08-08 ENCOUNTER — Other Ambulatory Visit: Payer: Medicare Other

## 2019-08-08 ENCOUNTER — Ambulatory Visit: Payer: PRIVATE HEALTH INSURANCE | Admitting: Adult Health

## 2019-08-08 ENCOUNTER — Ambulatory Visit
Admission: RE | Admit: 2019-08-08 | Discharge: 2019-08-08 | Disposition: A | Discharge status: Home / Self Care | Payer: Medicare Other | Source: Ambulatory Visit | Attending: Adult Health | Admitting: Adult Health

## 2019-08-08 ENCOUNTER — Other Ambulatory Visit: Payer: Self-pay

## 2019-08-08 DIAGNOSIS — R413 Other amnesia: Secondary | ICD-10-CM | POA: Diagnosis not present

## 2019-08-09 ENCOUNTER — Encounter: Payer: Self-pay | Admitting: Adult Health

## 2019-08-09 NOTE — Telephone Encounter (Signed)
Mailed result note, as was not sure received via mychart.

## 2019-08-11 ENCOUNTER — Telehealth: Payer: Self-pay | Admitting: *Deleted

## 2019-08-11 NOTE — Telephone Encounter (Signed)
Called neice, [place], LVM informing her that patient's  recent MRI did not show any new findings compared to prior MRI . Left # for questions.

## 2019-08-11 NOTE — Telephone Encounter (Signed)
Message from patient

## 2019-08-15 ENCOUNTER — Other Ambulatory Visit: Payer: Self-pay | Admitting: Adult Health

## 2019-08-15 MED ORDER — SERTRALINE HCL 25 MG PO TABS: 25.0000 mg | ORAL_TABLET | Freq: Every day | ORAL | 3 refills | Status: DC

## 2019-08-15 MED ORDER — QUETIAPINE FUMARATE 25 MG PO TABS: 25.0000 mg | ORAL_TABLET | Freq: Every day | ORAL | 3 refills | Status: DC

## 2019-08-16 ENCOUNTER — Encounter: Payer: Self-pay | Admitting: Cardiovascular Disease

## 2019-08-16 ENCOUNTER — Ambulatory Visit (INDEPENDENT_AMBULATORY_CARE_PROVIDER_SITE_OTHER): Payer: Medicare Other | Admitting: Cardiovascular Disease

## 2019-08-16 ENCOUNTER — Other Ambulatory Visit: Payer: Self-pay

## 2019-08-16 VITALS — BP 118/50 | HR 65 | Ht 66.0 in | Wt 188.0 lb

## 2019-08-16 DIAGNOSIS — I2510 Atherosclerotic heart disease of native coronary artery without angina pectoris: Secondary | ICD-10-CM

## 2019-08-16 DIAGNOSIS — I10 Essential (primary) hypertension: Secondary | ICD-10-CM | POA: Diagnosis not present

## 2019-08-16 DIAGNOSIS — E785 Hyperlipidemia, unspecified: Secondary | ICD-10-CM | POA: Diagnosis not present

## 2019-08-16 NOTE — Patient Instructions (Signed)
Medication Instructions:  Your physician recommends that you continue on your current medications as directed. Please refer to the Current Medication list given to you today.  *If you need a refill on your cardiac medications before your next appointment, please call your pharmacy*  Lab Work: None ordered If you have labs (blood work) drawn today and your tests are completely normal, you will receive your results only by: Marland Kitchen MyChart Message (if you have MyChart) OR . A paper copy in the mail If you have any lab test that is abnormal or we need to change your treatment, we will call you to review the results.  Testing/Procedures: None ordered  Follow-Up: At [facility], you and your health needs are our priority.  As part of our continuing mission to provide you with exceptional heart care, we have created designated Provider Care Teams.  These Care Teams include your primary Cardiologist (physician) and Advanced Practice Providers (APPs -  Physician Assistants and Nurse Practitioners) who all work together to provide you with the care you need, when you need it.  Your next appointment:   6 month(s)  The format for your next appointment:   In Person  Provider:    You may see  Dr. Fletcher Anon or one of the following Advanced Practice Providers on your designated Care Team:    Murray Hodgkins, NP  Christell Faith, PA-C  Marrianne Mood, PA-C   Other Instructions N/A

## 2019-08-16 NOTE — Progress Notes (Signed)
Cardiology Office Note   Date:  08/16/2019   ID:  Horizon, Nijjar 1940-12-01, MRN [id number]  PCP:  Ria Bush, MD  Cardiologist:   Kathlyn Sacramento, MD   Chief Complaint  Patient presents with  . OTHER    3 month f/u. Meds reviewed verbally with pt.      History of Present Illness: Julia Banks is a 79 y.o. female who presents for a follow-up visit. The patient has multiple risk factors for coronary artery disease including diabetes which has been mostly uncontrolled, hypertension, chronic kidney disease, hyperlipidemia and obesity.  She was seen by me in 2015 for nocturnal chest pain worrisome for angina.  Cardiac catheterization at that time showed mild nonobstructive 2 vessel coronary artery disease (20% in the left circumflex and 30% in the RCA). Ejection fraction was normal.  It was felt that some of her nocturnal chest pain might have been related to sleep apnea and improved with using CPAP.  She had a stroke in September 2020.  MRI of the brain showed acute infarct at the right basal ganglia and corona radiata.  Echocardiogram showed an EF of 60 to 65% with no significant valvular abnormalities.  The stroke was determined to be most likely due to small vessel disease.  However, embolic event could not be ruled out.  She was discharged to inpatient rehab where she stayed for few weeks. 30-day outpatient monitor showed no evidence of atrial fibrillation.  During last visit, her blood pressure was noted to be very elevated.  Metoprolol was switched to carvedilol.  She has known history of hyperlipidemia with intolerance to potent statins.  She was started on Repatha and she has been tolerating the medication.  She had some decline in her cognitive abilities recently.  She also had COVID-19 infection in March.  Past Medical History:  Diagnosis Date  . Arthritis   . Asthma   . Breast cancer, left breast ([place]) 02/2014   DCIS, ER+ PR- initially rec bilat mastectomy  with sentinal LN biopsy L axilla but now planning just L mastectomy [name])  . Chronic kidney disease, stage 3   . Colon polyps    benign  . Complication of anesthesia    hard to wake up  . Coronary artery disease    30% stenosis reported on previous cardiac catheterization in 2006. This was done in [facility]  . COVID-19 virus infection 07/2019  . GERD (gastroesophageal reflux disease)   . Headaches, cluster   . Hyperlipidemia   . Hypertension   . Hypothyroidism   . Osteopenia 07/2014   DEXA 0.5 spine, 01.7 R femoral neck  . Pneumonia    years ago  . Secondary hyperparathyroidism of renal origin ([place])    likely  . Sensorineural hearing loss (SNHL) of both ears 01/27/2016   Audiology eval - moderately severe sensorineural hearing loss bilaterally rec amplification (07/2016 - [name] audiologist)  . Sleep apnea    on CPAP  . Thyroid disease   . Type 2 diabetes, uncontrolled, with neuropathy ([place])    followed by endo Dr Buddy Duty    Past Surgical History:  Procedure Laterality Date  . ABDOMINAL HYSTERECTOMY     heavy bleeding, ovaries remain  . APPENDECTOMY    . BREAST BIOPSY Right    benign  . BREAST BIOPSY Left 02/2014   ?DCIS  . CARDIAC CATHETERIZATION  2006   [place], [place].   . CARDIAC CATHETERIZATION  10/2013  mild 2v nonobstructive disease, EF 60%  . CARDIOVASCULAR STRESS TEST  09/2012   WNL ([facility]inzlie Harney)  . CHOLECYSTECTOMY    . COLONOSCOPY  05/2010   2 TAs, diverticulosis, rpt 2 yrs (Dr Elissa Hefty in [place])  . COLONOSCOPY  12/2014   1 TA, rpt 5 yrs Henrene Pastor)  . cortisone right knee  01/04/15  . GALLBLADDER SURGERY    . MASTECTOMY W/ SENTINEL NODE BIOPSY Left 06/07/2014   DR WAKEFIELD  . SIMPLE MASTECTOMY WITH AXILLARY SENTINEL NODE BIOPSY Left 06/07/2014   Procedure: LEFT TOTAL MASTECTOMY WITH LEFT AXILLARY SENTINEL NODE BIOPSY;  Surgeon: Rolm Bookbinder, MD;  Location: [facility];  Service: General;  Laterality: Left;     Current  Outpatient Medications  Medication Sig Dispense Refill  . acetaminophen (TYLENOL) 325 MG tablet Take 2 tablets (650 mg total) by mouth 3 (three) times daily as needed for mild pain (or temp > 37.5 C (99.5 F)).    Marland Kitchen albuterol (PROVENTIL HFA;VENTOLIN HFA) 108 (90 BASE) MCG/ACT inhaler Inhale into the lungs every 6 (six) hours as needed for wheezing or shortness of breath.    Marland Kitchen amLODipine (NORVASC) 10 MG tablet Take 1 tablet (10 mg total) by mouth daily. 90 tablet 3  . Ascorbic Acid (VITAMIN C WITH ROSE HIPS) 500 MG tablet Take 500 mg by mouth daily.    Marland Kitchen aspirin EC 81 MG EC tablet Take 1 tablet (81 mg total) by mouth daily. 30 tablet 2  . baclofen (LIORESAL) 10 MG tablet Take 1 tablet (10 mg total) by mouth at bedtime. 30 each 3  . carvedilol (COREG) 6.25 MG tablet Take 1 tablet (6.25 mg total) by mouth 2 (two) times daily. 180 tablet 3  . Cholecalciferol (VITAMIN D PO) Take 5,000 Units by mouth.    . docusate sodium (COLACE) 100 MG capsule Take 200 mg by mouth daily.     . Evolocumab (REPATHA SURECLICK) [id number] MG/ML SOAJ Inject 1 pen into the skin every 14 (fourteen) days. 2 pen 5  . famotidine (PEPCID) 20 MG tablet Take 1 tablet (20 mg total) by mouth at bedtime. 90 tablet 3  . Fluticasone-Salmeterol (ADVAIR) 250-50 MCG/DOSE AEPB Inhale 1 puff into the lungs 2 (two) times daily. 60 each 11  . hydrochlorothiazide (MICROZIDE) 12.5 MG capsule Take 1 capsule (12.5 mg total) by mouth daily. 90 capsule 3  . Insulin Glargine (LANTUS) 100 UNIT/ML Solostar Pen Inject 42 Units into the skin at bedtime. 45 mL 3  . Insulin Pen Needle (BD PEN NEEDLE NANO U/F) 32G X 4 MM MISC USE 1 AS DIRECTED TO INJECT MEDICATION DAILY. 100 each 3  . levothyroxine (SYNTHROID) 25 MCG tablet Take one tablet daily, take 2 tablets on Monday, wednesday, friday 120 tablet 3  . losartan (COZAAR) 100 MG tablet Take 1 tablet (100 mg total) by mouth daily. 90 tablet 3  . Omega-3 Fatty Acids (OMEGA 3 PO) Take 1 capsule by mouth daily. 180  EPA/120DHA     . QUEtiapine (SEROQUEL) 25 MG tablet Take 1 tablet (25 mg total) by mouth at bedtime. 30 tablet 3  . sertraline (ZOLOFT) 25 MG tablet Take 1 tablet (25 mg total) by mouth daily. 30 tablet 3  . vitamin B-12 (CYANOCOBALAMIN) 1000 MCG tablet Take 1,000 mcg by mouth daily.     No current facility-administered medications for this visit.    Allergies:   Altace [ramipril], Aspirin, and Metformin and related    Social History:  The patient  reports that she has never smoked.  She has never used smokeless tobacco. She reports that she does not drink alcohol or use drugs.   Family History:  The patient's family history includes Arthritis in her father and mother; Breast cancer in an other family member; CAD in her father; Colon cancer in her maternal uncle; Diabetes in her mother; Hyperlipidemia in her father and mother; Hypertension in her father and mother; Leukemia in her brother; Melanoma (age of onset: 67) in an other family member; Other in her mother; Ovarian cancer in an other family member; Ovarian cancer (age of onset: 62) in her maternal grandmother; Stroke in her mother.    ROS:  Please see the history of present illness.   Otherwise, review of systems are positive for none.   All other systems are reviewed and negative.    PHYSICAL EXAM: VS:  BP (!) 118/50 (BP Location: Right Arm, Patient Position: Sitting, Cuff Size: Large)   Pulse 65   Ht 5\' 6"  (1.676 m)   Wt 188 lb (85.3 kg)   SpO2 98%   BMI 30.34 kg/m  , BMI Body mass index is 30.34 kg/m. GEN: Well nourished, well developed, in no acute distress  HEENT: normal  Neck: no JVD, carotid bruits, or masses Cardiac: RRR; no murmurs, rubs, or gallops,no edema  Respiratory:  clear to auscultation bilaterally, normal work of breathing GI: soft, nontender, nondistended, + BS MS: no deformity or atrophy  Skin: warm and dry, no rash Neuro: Left-sided weakness noted Psych: euthymic mood, full affect   EKG:  EKG is  ordered today. The ekg ordered today demonstrates normal sinus rhythm with low voltage with poor R wave progression in the anterior leads.   Recent Labs: 07/27/2019: ALT 8; BUN 34; Creatinine, Ser 1.63; Hemoglobin 12.0; Platelets 198; Potassium 3.5; Sodium 145; TSH 2.990    Lipid Panel    Component Value Date/Time   CHOL 149 07/27/2019 1344   TRIG 221 (H) 07/27/2019 1344   HDL 39 (L) 07/27/2019 1344   CHOLHDL 3.8 07/27/2019 1344   CHOLHDL 5 05/23/2019 0820   VLDL 31.0 05/23/2019 0820   LDLCALC 73 07/27/2019 1344   LDLDIRECT 112.0 03/30/2017 1018      Wt Readings from Last 3 Encounters:  08/16/19 188 lb (85.3 kg)  07/04/19 180 lb (81.6 kg)  05/17/19 180 lb (81.6 kg)       No flowsheet data found.    ASSESSMENT AND PLAN:  1.  Coronary artery disease involving native coronary arteries without angina: Previous cardiac catheterization showed nonobstructive coronary artery disease.  Currently with no anginal symptoms.  Continue medical therapy.  2.  Essential hypertension: Blood pressure is now well controlled on current medications.  Most recent labs did show mild worsening of renal function in the setting of COVID-19 infection.  I advised her to increase fluid intake.  Continue same medications for now.  If blood pressure starts increasing again, renal artery duplex can be considered.  3.  Hyperlipidemia with intolerance to statins: She is doing well on Repatha with significant improvement in her LDL which was down to 73.  4.  History of stroke: Some cognitive decline after that.  Continue follow-up with neurology.   Disposition:   FU with me in 6 months  Signed,  Kathlyn Sacramento, MD  08/16/2019 2:29 PM    [place]

## 2019-08-19 ENCOUNTER — Encounter: Payer: Self-pay | Admitting: Family Medicine

## 2019-08-30 NOTE — Telephone Encounter (Signed)
Can a letter please be provided stating  Julia Banks is a patient of our neurological practice being routinely followed after her stroke with residual vascular moderate cognitive impairment.     Thank you.

## 2019-09-05 ENCOUNTER — Ambulatory Visit: Payer: Medicare Other | Admitting: Physical Medicine & Rehabilitation

## 2019-09-06 ENCOUNTER — Other Ambulatory Visit: Payer: Self-pay | Admitting: Adult Health

## 2019-09-08 ENCOUNTER — Encounter: Payer: Self-pay | Admitting: Adult Health

## 2019-09-12 ENCOUNTER — Other Ambulatory Visit: Payer: Medicare Other

## 2019-09-19 ENCOUNTER — Encounter: Payer: Self-pay | Admitting: Adult Health

## 2019-09-26 ENCOUNTER — Encounter: Payer: Self-pay | Admitting: Adult Health

## 2019-09-26 ENCOUNTER — Other Ambulatory Visit: Payer: Self-pay

## 2019-09-26 ENCOUNTER — Ambulatory Visit (INDEPENDENT_AMBULATORY_CARE_PROVIDER_SITE_OTHER): Payer: Medicare Other | Admitting: Adult Health

## 2019-09-26 VITALS — BP 136/66 | HR 74 | Temp 97.6°F | Ht 66.0 in | Wt 188.0 lb

## 2019-09-26 DIAGNOSIS — E785 Hyperlipidemia, unspecified: Secondary | ICD-10-CM

## 2019-09-26 DIAGNOSIS — I63511 Cerebral infarction due to unspecified occlusion or stenosis of right middle cerebral artery: Secondary | ICD-10-CM | POA: Diagnosis not present

## 2019-09-26 DIAGNOSIS — E1122 Type 2 diabetes mellitus with diabetic chronic kidney disease: Secondary | ICD-10-CM | POA: Diagnosis not present

## 2019-09-26 DIAGNOSIS — F0151 Vascular dementia with behavioral disturbance: Secondary | ICD-10-CM | POA: Diagnosis not present

## 2019-09-26 DIAGNOSIS — F01518 Vascular dementia, unspecified severity, with other behavioral disturbance: Secondary | ICD-10-CM

## 2019-09-26 DIAGNOSIS — N183 Chronic kidney disease, stage 3 unspecified: Secondary | ICD-10-CM

## 2019-09-26 DIAGNOSIS — I10 Essential (primary) hypertension: Secondary | ICD-10-CM

## 2019-09-26 MED ORDER — SERTRALINE HCL 25 MG PO TABS: 25.0000 mg | ORAL_TABLET | Freq: Every day | ORAL | 3 refills | Status: DC

## 2019-09-26 MED ORDER — QUETIAPINE FUMARATE 50 MG PO TABS: 50.0000 mg | ORAL_TABLET | Freq: Every day | ORAL | 3 refills | Status: DC

## 2019-09-26 MED ORDER — SERTRALINE HCL 50 MG PO TABS: 50.0000 mg | ORAL_TABLET | Freq: Every day | ORAL | 3 refills | Status: DC

## 2019-09-26 NOTE — Progress Notes (Signed)
[facility] [address] [place]. [place] [address] [phone]       STROKE FOLLOW UP NOTE  [name] Date of Birth:  April 17, 1941 Medical Record Number:  [id number]   Reason for Referral: stroke follow up    CHIEF COMPLAINT:  Chief Complaint  Patient presents with  . Follow-up    treatment room, with niece , history of stroke     HPI:    Julia Banks is a 79 year old female who is being seen today, 09/26/2019, for stroke follow-up accompanied by her niece.  Residual stroke deficits cognitive impairment with behaviors and left spastic hemiparesis.  At prior visit, concerns of worsening cognition and behaviors with MRI unremarkable and dementia panel slightly elevated homocysteine level likely clinically insignificant.  Refused EEG.  Niece denies worsening of cognition but does endorse fluctuation of paranoia, hallucinations, anxiety and insomnia.  No benefit with use of Depakote with possible side effects therefore discontinued.  Previously on Lexapro and recommended switching to sertraline as well as initiating Seroquel after prior visit.  Reports behaviors and depression initially greatly improved but has been seeing increased fluctuation of dementia related behaviors including paranoia, hallucinations, anxiety and insomnia.  MMSE today 28/30 (prior 21/30).  Recent fall 1.5 weeks ago with possible reinjury to previously left fractured ankle and does have scheduled follow-up with orthopedics for further evaluation.  Continues on aspirin 81 mg daily and Repatha for secondary stroke prevention without side effects.  Blood pressure today 136/66.      History: Provided for reference purposes only Update 07/27/2019: Julia Banks is a 79 year old female who is being seen today by request of her niece due to worsening cognition and behavioral concerns.  Patient is accompanied today by nieces son.  Family started to observe worsening memory approximately 4  weeks ago and did worsen even further with recent diagnosis of Covid approximately 2 to 3 weeks ago. She also unfortunately had a fall around 05/2019 resulting in left fibular fracture therefore has not been able to precipitate in any therapies as she is nonweightbearing at this time.  Family member does state memory/confusion is typically worse in the evening along with worsening of dysarthria.  Daughter sent my chart message which prompted today's visit as she has been hallucinating, increased agitation and irritability especially after recent fall last week.  She did not seek medical evaluation after fall.  She is fatigued majority of the day but sleeps well at night.  Residual stroke deficits of dysarthria and left spastic hemiparesis with slight worsening of LLE due to recent fracture.  She does continue to follow with physical medicine and rehab and continues on baclofen 10 mg nightly.  She does continue to complain of pain due to spasticity.  Continues on aspirin 81 mg daily and pravastatin 40 mg daily for secondary stroke prevention effects.  Blood pressure today 153/66.  No further concerns at this time.  Initial visit 05/05/2019: Julia Banks is a 79 year old female who is being seen today for hospital follow-up accompanied by her niece.  Residual deficits of left-sided weakness and dysarthria.  Continues to participate with home health PT/OT with ongoing improvement.  Currently in a wheelchair which she uses for long distance but is able to ambulate short distances with hemiwalker.  She does endorse increased pain left upper and lower extremity with occasional spasms.  She is also been experiencing headaches since discharge that can occur frontal and occipital sometimes radiating up from her neck.  Headaches have been improving  and will use Tylenol with benefit.  Her niece has moved in with her to assist with ADLs and ongoing care.  She has completed 3 weeks DAPT and continues on aspirin alone without  bleeding or bruising.  She does have scheduled lipid clinic evaluation in January to initiate Repatha due to history of statin intolerance as well as Zetia intolerance.  Start a 30-day cardiac event monitor on 04/30/1929.  Blood pressure today 164/73.  Monitors at home and typically ranges 140s-180s/60s-70s.  Continues to follow with PCP and cardiology with ongoing adjustment to antihypertensives.  No further concerns at this time.  Denies new or worsening stroke/TIA symptoms.   Stroke admission 03/07/2019: Julia Banks is a 79 y.o. female with history of iabetes, thyroid disease, sensorineural hearing loss, hypothyroidism, hypertension, hyperlipidemia, cluster headaches, CAD, CKD stage III  who presented on 03/07/2019 with left-sided weakness and decreased sensation.  Stroke work-up showed right AchA territory infarct as evidenced on MRI most likely secondary to small vessel disease however embolic event cannot be ruled out given AchA is coming out directly from carotid artery.  CTA head/neck negative E LVO but did show arthrosclerosis.  MRI showed right basal ganglia and corona radiata infarct at right AchA territory.  2D echo showed an EF of 60 to 65%.  Recommended 30-day cardiac event monitor outpatient to rule out A. fib due to recent stroke and history of intermittent heart palpitations.  LDL 144.  A1c 7.2.  Recommended DAPT for 3 weeks and aspirin alone.  HTN stable.  History of statin intolerance and recommended PCP follow-up for continued Repatha.  Uncontrolled DM and recommend close PCP follow-up.  Other stroke risk factors include advanced age, family history of stroke, CAD and OSA on CPAP.  Other active problems include history of left breast cancer status postmastectomy 2015,  CKD stage III,  Hypothyroidism, secondary hyperparathyroidism and sensorineural hearing loss bilaterally.  Residual deficits of left hemiparesis and discharged to CIR for ongoing therapy.        ROS:   14 system  review of systems performed and negative with exception of memory loss, confusion, and weakness  PMH:  Past Medical History:  Diagnosis Date  . Arthritis   . Asthma   . Breast cancer, left breast ([place]) 02/2014   DCIS, ER+ PR- initially rec bilat mastectomy with sentinal LN biopsy L axilla but now planning just L mastectomy [name])  . Chronic kidney disease, stage 3   . Colon polyps    benign  . Complication of anesthesia    hard to wake up  . Coronary artery disease    30% stenosis reported on previous cardiac catheterization in 2006. This was done in [facility]  . COVID-19 virus infection 07/2019  . GERD (gastroesophageal reflux disease)   . Headaches, cluster   . Hyperlipidemia   . Hypertension   . Hypothyroidism   . Osteopenia 07/2014   DEXA 0.5 spine, 01.7 R femoral neck  . Pneumonia    years ago  . Secondary hyperparathyroidism of renal origin ([place])    likely  . Sensorineural hearing loss (SNHL) of both ears 01/27/2016   Audiology eval - moderately severe sensorineural hearing loss bilaterally rec amplification (07/2016 - [name] audiologist)  . Sleep apnea    on CPAP  . Thyroid disease   . Type 2 diabetes, uncontrolled, with neuropathy ([place])    followed by endo Dr Buddy Duty    [facility]:  Past Surgical History:  Procedure  Laterality Date  . ABDOMINAL HYSTERECTOMY     heavy bleeding, ovaries remain  . APPENDECTOMY    . BREAST BIOPSY Right    benign  . BREAST BIOPSY Left 02/2014   ?DCIS  . CARDIAC CATHETERIZATION  2006   [place], [place].   . CARDIAC CATHETERIZATION  10/2013   mild 2v nonobstructive disease, EF 60%  . CARDIOVASCULAR STRESS TEST  09/2012   WNL ([place])  . CHOLECYSTECTOMY    . COLONOSCOPY  05/2010   2 TAs, diverticulosis, rpt 2 yrs (Dr Elissa Hefty in [place])  . COLONOSCOPY  12/2014   1 TA, rpt 5 yrs Henrene Pastor)  . cortisone right knee  01/04/15  . GALLBLADDER SURGERY    . MASTECTOMY W/ SENTINEL NODE BIOPSY Left 06/07/2014   DR  [place]  . SIMPLE MASTECTOMY WITH AXILLARY SENTINEL NODE BIOPSY Left 06/07/2014   Procedure: LEFT TOTAL MASTECTOMY WITH LEFT AXILLARY SENTINEL NODE BIOPSY;  Surgeon: Rolm Bookbinder, MD;  Location: [facility];  Service: General;  Laterality: Left;    Social History:  Social History   Socioeconomic History  . Marital status: Widowed    Spouse name: Not on file  . Number of children: 0  . Years of education: Not on file  . Highest education level: Not on file  Occupational History  . Not on file  Tobacco Use  . Smoking status: Never Smoker  . Smokeless tobacco: Never Used  . Tobacco comment: second hand exposure  Substance and Sexual Activity  . Alcohol use: No  . Drug use: No  . Sexual activity: Not on file  Other Topics Concern  . Not on file  Social History Narrative   Widow, husband Fritz Pickerel passed 6.  Has 3 dogs   No children. Niece [name] supportive.    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, nutritional   Edu: 1 yr college   Activity: stationary bicycle, some walking   Diet: good water, fruits/vegetables daily      G0P0   Social Determinants of Radio broadcast assistant Strain: Low Risk   . Difficulty of Paying Living Expenses: Not hard at all  Food Insecurity: No Food Insecurity  . Worried About Charity fundraiser in the Last Year: Never true  . Ran Out of Food in the Last Year: Never true  Transportation Needs: No Transportation Needs  . Lack of Transportation (Medical): No  . Lack of Transportation (Non-Medical): No  Physical Activity:   . Days of Exercise per Week:   . Minutes of Exercise per Session:   Stress:   . Feeling of Stress :   Social Connections:   . Frequency of Communication with Friends and Family:   . Frequency of Social Gatherings with Friends and Family:   . Attends Religious Services:   . Active Member of Clubs or Organizations:   . Attends Archivist Meetings:   Marland Kitchen Marital Status:   Intimate  Partner Violence:   . Fear of Current or Ex-Partner:   . Emotionally Abused:   Marland Kitchen Physically Abused:   . Sexually Abused:     Family History:  Family History  Problem Relation Age of Onset  . Arthritis Mother   . Hyperlipidemia Mother   . Stroke Mother   . Hypertension Mother   . Diabetes Mother   . Other Mother        parathyroid adenoma  . Arthritis Father   . Hyperlipidemia Father   . CAD Father  MI  . Hypertension Father   . Breast cancer Other   . Ovarian cancer Other   . Leukemia Brother        dx in his 48s  . Colon cancer Maternal Uncle        dx in his 88s  . Ovarian cancer Maternal Grandmother 90  . Melanoma Other 27       dx 2x - 28 and 29/dx 3x - ages 43, 66, and 100  . Cancer Neg Hx   . Crohn's disease Neg Hx   . Colon polyps Neg Hx   . Ulcerative colitis Neg Hx   . Stomach cancer Neg Hx   . Rectal cancer Neg Hx     Medications:   Current Outpatient Medications on File Prior to Visit  Medication Sig Dispense Refill  . acetaminophen (TYLENOL) 325 MG tablet Take 2 tablets (650 mg total) by mouth 3 (three) times daily as needed for mild pain (or temp > 37.5 C (99.5 F)).    Marland Kitchen albuterol (PROVENTIL HFA;VENTOLIN HFA) 108 (90 BASE) MCG/ACT inhaler Inhale into the lungs every 6 (six) hours as needed for wheezing or shortness of breath.    Marland Kitchen amLODipine (NORVASC) 10 MG tablet Take 1 tablet (10 mg total) by mouth daily. 90 tablet 3  . Ascorbic Acid (VITAMIN C WITH ROSE HIPS) 500 MG tablet Take 500 mg by mouth daily.    Marland Kitchen aspirin EC 81 MG EC tablet Take 1 tablet (81 mg total) by mouth daily. 30 tablet 2  . baclofen (LIORESAL) 10 MG tablet Take 1 tablet (10 mg total) by mouth at bedtime. 30 each 3  . carvedilol (COREG) 6.25 MG tablet Take 1 tablet (6.25 mg total) by mouth 2 (two) times daily. 180 tablet 3  . Cholecalciferol (VITAMIN D PO) Take 5,000 Units by mouth.    . docusate sodium (COLACE) 100 MG capsule Take 200 mg by mouth daily.     . Evolocumab (REPATHA  SURECLICK) XX123456 MG/ML SOAJ Inject 1 pen into the skin every 14 (fourteen) days. 2 pen 5  . famotidine (PEPCID) 20 MG tablet Take 1 tablet (20 mg total) by mouth at bedtime. 90 tablet 3  . Fluticasone-Salmeterol (ADVAIR) 250-50 MCG/DOSE AEPB Inhale 1 puff into the lungs 2 (two) times daily. 60 each 11  . hydrochlorothiazide (MICROZIDE) 12.5 MG capsule Take 1 capsule (12.5 mg total) by mouth daily. 90 capsule 3  . Insulin Glargine (LANTUS) 100 UNIT/ML Solostar Pen Inject 42 Units into the skin at bedtime. 45 mL 3  . Insulin Pen Needle (BD PEN NEEDLE NANO U/F) 32G X 4 MM MISC USE 1 AS DIRECTED TO INJECT MEDICATION DAILY. 100 each 3  . levothyroxine (SYNTHROID) 25 MCG tablet Take one tablet daily, take 2 tablets on Monday, wednesday, friday 120 tablet 3  . losartan (COZAAR) 100 MG tablet Take 1 tablet (100 mg total) by mouth daily. 90 tablet 3  . Omega-3 Fatty Acids (OMEGA 3 PO) Take 1 capsule by mouth daily. 180 EPA/120DHA      No current facility-administered medications on file prior to visit.    Allergies:   Allergies  Allergen Reactions  . Altace [Ramipril] Other (See Comments)    Upset stomach  . Aspirin Nausea And Vomiting  . Metformin And Related Other (See Comments)    GI upset     Physical Exam  Vitals:   09/26/19 1403  BP: 136/66  Pulse: 74  Temp: 97.6 F (36.4 C)  Weight: 188 lb (  85.3 kg)  Height: 5\' 6"  (1.676 m)   Body mass index is 30.34 kg/m. No exam data present  Depression screen [facility] 2/9 05/30/2019  Decreased Interest 0  Down, Depressed, Hopeless 0  PHQ - 2 Score 0  Some recent data might be hidden     General: well developed, well nourished, pleasant elderly Caucasian female, seated, in no evident distress Head: head normocephalic and atraumatic.   Neck: supple with no carotid or supraclavicular bruits Cardiovascular: regular rate and rhythm, no murmurs Musculoskeletal: no deformity Skin:  no rash/petichiae Vascular:  Normal pulses all extremities     Neurologic Exam Mental Status: Awake and fully alert.   Limited participation in conversation with possible mild dysarthria.  Oriented to place and time.  Mood and affect appropriate.  MMSE - Mini Mental State Exam 09/26/2019 07/27/2019  Orientation to time 5 2  Orientation to Place 5 5  Registration 3 3  Attention/ Calculation 3 0  Recall 3 2  Language- name 2 objects 2 2  Language- repeat 1 1  Language- follow 3 step command 3 3  Language- read & follow direction 1 1  Write a sentence 1 1  Copy design 1 1  Total score 28 21   Cranial Nerves: Pupils equal, briskly reactive to light. Extraocular movements full without nystagmus. Visual fields full to confrontation. Hearing intact. Facial sensation intact.  Slight left lower facial weakness Motor:  LUE: 4/5 tricep with 3/5 grip strength with increased spasticity LLE: 4/5 right wrist and hip flexor.  Unable to test dorsiflexion or plantarflexion due to possible reinjury of ankle currently wearing a boot.   Sensory.:  Sensation intact throughout all tested extremities Coordination: Rapid alternating movements normal in all extremities except decreased left hand dexterity. Finger-to-nose and heel-to-shin performed accurately on right side. Gait and Station: Deferred as in wheelchair Reflexes: 1+ and symmetric. Toes downgoing.     Diagnostic Data (Labs, Imaging, Testing)  MR BRAIN WO CONTRAST 03/07/2019 IMPRESSION: Acute perforator infarct at the right basal ganglia and corona radiata.  08/08/2019 IMPRESSION:  MRI brain (without) demonstrating: - Chronic right basal ganglia lacunar ischemic infarct. - Mild chronic small vessel ischemic disease. - Mild-moderate atrophy.  - No acute findings.        ASSESSMENT: Julia Banks is a 79 y.o. year old female presented with left-sided weakness and decreased sensation on 03/07/2019 with stroke work-up revealing right basal ganglia and corona radiata infarct at right AchA territory  most likely secondary to small vessel disease however embolic source cannot be ruled out.  30-day cardiac event monitor negative for A. fib.  Vascular risk factors include HTN, HLD, DM, CAD and OSA on CPAP.  She has been doing well from a stroke standpoint with improvement of left spastic hemiparesis as well as cognition with MMSE 28/30 (prior 21/30).  Continues to have fluctuation of dementia related behaviors with initiation of Seroquel 25 mg nightly and switching SSRI from Lexapro to sertraline    PLAN:  1. Vascular dementia with behaviors: Objectively improvement from prior visit but subjectively stable memory with ongoing fluctuation of behaviors.  Due to ongoing behaviors, recommend increasing Seroquel from 25 mg to 50 mg nightly and continuation of sertraline 25 mg daily.  Discussion regarding possible side effects and niece will notify office with any concerns.  Discussion regarding importance of managing stroke risk factors and continues to follow with PCP for ongoing monitoring management 2. Right AchA territory stroke: Continue aspirin 81 mg daily and Repatha for  secondary stroke prevention.  Maintain strict control of hypertension with blood pressure goal below 130/90, diabetes with hemoglobin A1c goal below 6.5% and cholesterol with LDL cholesterol (bad cholesterol) goal below 70 mg/dL.  I also advised the patient to eat a healthy diet with plenty of whole grains, cereals, fruits and vegetables, exercise regularly with at least 30 minutes of continuous activity daily and maintain ideal body weight. 3. HTN: Advised to continue current treatment regimen.  Advised to continue to monitor at home and follow-up with PCP/cardiology regarding need of management of uncontrolled blood pressures 4. HLD: Continuation of Repatha.  History of statin and Zetia intolerance.  5. DMII: Advised to continue to monitor glucose levels at home along with continued follow-up with PCP for management and  monitoring 6. OSA on CPAP: Continuation of compliance    Follow up in 3 months or call earlier if needed   I spent 32 minutes of face-to-face and non-face-to-face time with patient.  This included previsit chart review, lab review, study review, order entry, electronic health record documentation, performing and reviewing MMSE, discussion regarding vascular cognitive impairment with behaviors and patient/niece education regarding ongoing management of secondary stroke risk factor management and answered all questions to satisfaction    Frann Rider, [facility]  [facility] [address] [place],  [address]  Phone [phone] Fax [phone] Note: This document was prepared with digital dictation and possible smart phrase technology. Any transcriptional errors that result from this process are unintentional.

## 2019-09-26 NOTE — Patient Instructions (Signed)
Recommend increasing Seroquel from 25 mg to 50 mg nightly and continuation of Zoloft 25 daily  Continue aspirin 81 mg daily  and Repatha for secondary stroke prevention  Continue to follow up with PCP regarding cholesterol, blood pressure and diabetes management   Continue to monitor blood pressure at home  Maintain strict control of hypertension with blood pressure goal below 130/90, diabetes with hemoglobin A1c goal below 6.5% and cholesterol with LDL cholesterol (bad cholesterol) goal below 70 mg/dL. I also advised the patient to eat a healthy diet with plenty of whole grains, cereals, fruits and vegetables, exercise regularly and maintain ideal body weight.  Followup in the future with me in 3 months or call earlier if needed       Thank you for coming to see [place] at [facility]. I hope we have been able to provide you high quality care today.  You may receive a patient satisfaction survey over the next few weeks. We would appreciate your feedback and comments so that we may continue to improve ourselves and the health of our patients.

## 2019-09-30 NOTE — Progress Notes (Signed)
I agree with the above plan 

## 2019-10-03 DIAGNOSIS — M79672 Pain in left foot: Secondary | ICD-10-CM | POA: Diagnosis not present

## 2019-10-05 ENCOUNTER — Encounter: Payer: Self-pay | Admitting: Adult Health

## 2019-10-05 NOTE — Telephone Encounter (Signed)
Yes I agree. 

## 2019-10-10 ENCOUNTER — Encounter: Payer: Medicare Other | Admitting: Physical Medicine & Rehabilitation

## 2019-10-14 ENCOUNTER — Other Ambulatory Visit: Payer: Self-pay | Admitting: Adult Health

## 2019-10-19 ENCOUNTER — Other Ambulatory Visit: Payer: Self-pay | Admitting: Family Medicine

## 2019-10-24 ENCOUNTER — Encounter: Payer: Self-pay | Admitting: Adult Health

## 2019-10-24 MED ORDER — SERTRALINE HCL 50 MG PO TABS: 50.0000 mg | ORAL_TABLET | Freq: Every day | ORAL | 3 refills | Status: DC

## 2019-11-03 ENCOUNTER — Other Ambulatory Visit: Payer: Self-pay | Admitting: Family Medicine

## 2019-11-03 NOTE — Telephone Encounter (Signed)
Lvm asking pt to call back.  Need to know if she is taking Klor-Con (potassium) or not.

## 2019-11-04 ENCOUNTER — Encounter: Payer: Self-pay | Admitting: Family Medicine

## 2019-11-04 NOTE — Telephone Encounter (Signed)
Received a call back and Lattie Haw states that patient is no longer taking Klor-Con, as she is no longer on the lasix.

## 2019-11-07 ENCOUNTER — Encounter: Payer: Medicare Other | Admitting: Physical Medicine & Rehabilitation

## 2019-11-14 ENCOUNTER — Other Ambulatory Visit: Payer: Self-pay | Admitting: Cardiovascular Disease

## 2019-11-15 ENCOUNTER — Encounter: Payer: Self-pay | Admitting: Adult Health

## 2019-11-15 MED ORDER — QUETIAPINE FUMARATE 50 MG PO TABS: 75.0000 mg | ORAL_TABLET | Freq: Every day | ORAL | 3 refills | Status: DC

## 2019-11-15 NOTE — Addendum Note (Signed)
Addended by: Mal Misty on: 11/15/2019 03:33 PM   Modules accepted: Orders

## 2019-12-02 ENCOUNTER — Other Ambulatory Visit: Payer: Self-pay | Admitting: Adult Health

## 2019-12-12 ENCOUNTER — Encounter: Payer: Self-pay | Admitting: Family Medicine

## 2019-12-12 ENCOUNTER — Other Ambulatory Visit: Payer: Self-pay | Admitting: Family Medicine

## 2019-12-13 ENCOUNTER — Other Ambulatory Visit: Payer: Self-pay

## 2019-12-16 MED ORDER — ALBUTEROL SULFATE HFA 108 (90 BASE) MCG/ACT IN AERS: 2.0000 | INHALATION_SPRAY | Freq: Four times a day (QID) | RESPIRATORY_TRACT | 3 refills | Status: DC | PRN

## 2019-12-19 ENCOUNTER — Encounter: Payer: Self-pay | Admitting: Adult Health

## 2019-12-20 ENCOUNTER — Encounter: Payer: Self-pay | Admitting: Family Medicine

## 2019-12-20 DIAGNOSIS — IMO0002 Reserved for concepts with insufficient information to code with codable children: Secondary | ICD-10-CM

## 2019-12-20 MED ORDER — SERTRALINE HCL 50 MG PO TABS: 75.0000 mg | ORAL_TABLET | Freq: Every day | ORAL | 3 refills | Status: DC

## 2019-12-20 NOTE — Telephone Encounter (Signed)
Lvm asking pt to call back.  Need to schedule 6 mo f/u, possibly virtual?

## 2019-12-22 NOTE — Telephone Encounter (Addendum)
Labs ordered.  Would prefer in office but if they want virtual ok for that too.

## 2019-12-22 NOTE — Addendum Note (Signed)
Addended by: Ria Bush on: 12/22/2019 02:52 PM   Modules accepted: Orders

## 2020-01-02 DIAGNOSIS — Z20822 Contact with and (suspected) exposure to covid-19: Secondary | ICD-10-CM | POA: Diagnosis not present

## 2020-01-08 ENCOUNTER — Encounter: Payer: Self-pay | Admitting: Adult Health

## 2020-01-09 MED ORDER — SERTRALINE HCL 100 MG PO TABS: 100.0000 mg | ORAL_TABLET | Freq: Every day | ORAL | 3 refills | Status: DC

## 2020-01-23 ENCOUNTER — Other Ambulatory Visit (INDEPENDENT_AMBULATORY_CARE_PROVIDER_SITE_OTHER): Payer: Medicare Other

## 2020-01-23 ENCOUNTER — Ambulatory Visit (INDEPENDENT_AMBULATORY_CARE_PROVIDER_SITE_OTHER): Payer: Medicare Other | Admitting: Adult Health

## 2020-01-23 ENCOUNTER — Other Ambulatory Visit: Payer: Self-pay

## 2020-01-23 ENCOUNTER — Encounter: Payer: Self-pay | Admitting: Adult Health

## 2020-01-23 ENCOUNTER — Encounter: Payer: Self-pay | Admitting: Family Medicine

## 2020-01-23 VITALS — BP 128/80 | HR 77 | Ht 66.0 in

## 2020-01-23 DIAGNOSIS — E785 Hyperlipidemia, unspecified: Secondary | ICD-10-CM | POA: Diagnosis not present

## 2020-01-23 DIAGNOSIS — Z8673 Personal history of transient ischemic attack (TIA), and cerebral infarction without residual deficits: Secondary | ICD-10-CM

## 2020-01-23 DIAGNOSIS — E1140 Type 2 diabetes mellitus with diabetic neuropathy, unspecified: Secondary | ICD-10-CM | POA: Diagnosis not present

## 2020-01-23 DIAGNOSIS — F0151 Vascular dementia with behavioral disturbance: Secondary | ICD-10-CM

## 2020-01-23 DIAGNOSIS — E1165 Type 2 diabetes mellitus with hyperglycemia: Secondary | ICD-10-CM | POA: Diagnosis not present

## 2020-01-23 DIAGNOSIS — I10 Essential (primary) hypertension: Secondary | ICD-10-CM

## 2020-01-23 DIAGNOSIS — E1122 Type 2 diabetes mellitus with diabetic chronic kidney disease: Secondary | ICD-10-CM | POA: Diagnosis not present

## 2020-01-23 DIAGNOSIS — N183 Chronic kidney disease, stage 3 unspecified: Secondary | ICD-10-CM

## 2020-01-23 DIAGNOSIS — IMO0002 Reserved for concepts with insufficient information to code with codable children: Secondary | ICD-10-CM

## 2020-01-23 DIAGNOSIS — F01518 Vascular dementia, unspecified severity, with other behavioral disturbance: Secondary | ICD-10-CM

## 2020-01-23 LAB — RENAL FUNCTION PANEL
Albumin: 4 g/dL (ref 3.5–5.2)
BUN: 15 mg/dL (ref 6–23)
CO2: 28 mEq/L (ref 19–32)
Calcium: 9.4 mg/dL (ref 8.4–10.5)
Chloride: 104 mEq/L (ref 96–112)
Creatinine, Ser: 1.28 mg/dL — ABNORMAL HIGH (ref 0.40–1.20)
GFR: 40.44 mL/min — ABNORMAL LOW (ref >60.00)
Glucose, Bld: 84 mg/dL (ref 70–99)
Phosphorus: 3.4 mg/dL (ref 2.3–4.6)
Potassium: 4.3 mEq/L (ref 3.5–5.1)
Sodium: 137 mEq/L (ref 135–145)

## 2020-01-23 LAB — LIPID PANEL
Cholesterol: 166 mg/dL (ref 0–200)
HDL: 40.2 mg/dL (ref >39.00)
NonHDL: 125.79
Total CHOL/HDL Ratio: 4
Triglycerides: 253 mg/dL — ABNORMAL HIGH (ref 0.0–149.0)
VLDL: 50.6 mg/dL — ABNORMAL HIGH (ref 0.0–40.0)

## 2020-01-23 LAB — LDL CHOLESTEROL, DIRECT: Direct LDL: 85 mg/dL

## 2020-01-23 LAB — HEMOGLOBIN A1C: Hgb A1c MFr Bld: 6.9 % — ABNORMAL HIGH (ref 4.6–6.5)

## 2020-01-23 NOTE — Progress Notes (Signed)
[facility] [address] [place]. [place] [address] [phone]       STROKE FOLLOW UP NOTE  Ms. TRACEE Banks Date of Birth:  1941/03/04 Medical Record Number:  [id number]   Reason for Referral: stroke follow up    CHIEF COMPLAINT:  Chief Complaint  Patient presents with  . Follow-up    3 month f/u. States her strength is slowly coming back.  Cognition stable and improvement of behaviors  . room 9    with niece     HPI:   Today, 01/23/2020, Ms. [name] returns for follow-up accompanied by her niece.  Residual stroke deficits of left spastic hemiparesis, dysarthria and likely vascular cognitive impairment with behaviors.  Niece contacted office via MyChart due to continued behaviors with insomnia, delusions, agitation and visual hallucinations.  Seroquel and sertraline dosages adjusted currently on Seroquel 75 mg and sertraline 100 mg nightly.  Tolerating medications well without side effects with improvement of behaviors.  MMSE today 21/30.  Limited mobility due to residual stroke deficits and history of left ankle fracture and transfers via wheelchair.  Does report occasional occipital headaches which have been present since stroke and typically subside after Tylenol.  Remains on aspirin 81 mg daily and Repatha without side effects.  Blood pressure today 120/80.  Glucose levels stable.  Recent lab work this morning by PCP.  No concerns at this time.    History provided for reference purposes only Update 09/26/2019 JM: [name] is a 79 year old female who is being seen today, 09/26/2019, for stroke follow-up accompanied by her niece.  Residual stroke deficits cognitive impairment with behaviors and left spastic hemiparesis.  At prior visit, concerns of worsening cognition and behaviors with MRI unremarkable and dementia panel slightly elevated homocysteine level likely clinically insignificant.  Refused EEG.  Niece denies worsening of cognition but does  endorse fluctuation of paranoia, hallucinations, anxiety and insomnia.  No benefit with use of Depakote with possible side effects therefore discontinued.  Previously on Lexapro and recommended switching to sertraline as well as initiating Seroquel after prior visit.  Reports behaviors and depression initially greatly improved but has been seeing increased fluctuation of dementia related behaviors including paranoia, hallucinations, anxiety and insomnia.  MMSE today 28/30 (prior 21/30).  Recent fall 1.5 weeks ago with possible reinjury to previously left fractured ankle and does have scheduled follow-up with orthopedics for further evaluation.  Continues on aspirin 81 mg daily and Repatha for secondary stroke prevention without side effects.  Blood pressure today 136/66.  Update 07/27/2019: [name] is a 79 year old female who is being seen today by request of her niece due to worsening cognition and behavioral concerns.  Patient is accompanied today by nieces son.  Family started to observe worsening memory approximately 4 weeks ago and did worsen even further with recent diagnosis of Covid approximately 2 to 3 weeks ago. She also unfortunately had a fall around 05/2019 resulting in left fibular fracture therefore has not been able to precipitate in any therapies as she is nonweightbearing at this time.  Family member does state memory/confusion is typically worse in the evening along with worsening of dysarthria.  Daughter sent my chart message which prompted today's visit as she has been hallucinating, increased agitation and irritability especially after recent fall last week.  She did not seek medical evaluation after fall.  She is fatigued majority of the day but sleeps well at night.  Residual stroke deficits of dysarthria and left spastic hemiparesis with slight worsening of  LLE due to recent fracture.  She does continue to follow with physical medicine and rehab and continues on baclofen 10 mg nightly.   She does continue to complain of pain due to spasticity.  Continues on aspirin 81 mg daily and pravastatin 40 mg daily for secondary stroke prevention effects.  Blood pressure today 153/66.  No further concerns at this time.  Initial visit 05/05/2019: [name] is a 79 year old female who is being seen today for hospital follow-up accompanied by her niece.  Residual deficits of left-sided weakness and dysarthria.  Continues to participate with home health PT/OT with ongoing improvement.  Currently in a wheelchair which she uses for long distance but is able to ambulate short distances with hemiwalker.  She does endorse increased pain left upper and lower extremity with occasional spasms.  She is also been experiencing headaches since discharge that can occur frontal and occipital sometimes radiating up from her neck.  Headaches have been improving and will use Tylenol with benefit.  Her niece has moved in with her to assist with ADLs and ongoing care.  She has completed 3 weeks DAPT and continues on aspirin alone without bleeding or bruising.  She does have scheduled lipid clinic evaluation in January to initiate Repatha due to history of statin intolerance as well as Zetia intolerance.  Start a 30-day cardiac event monitor on 04/[birth year]/19[birth year].  Blood pressure today 164/73.  Monitors at home and typically ranges 140s-180s/60s-70s.  Continues to follow with PCP and cardiology with ongoing adjustment to antihypertensives.  No further concerns at this time.  Denies new or worsening stroke/TIA symptoms.   Stroke admission 03/07/2019: [name] is a 79 y.o. female with history of iabetes, thyroid disease, sensorineural hearing loss, hypothyroidism, hypertension, hyperlipidemia, cluster headaches, CAD, CKD stage III  who presented on 03/07/2019 with left-sided weakness and decreased sensation.  Stroke work-up showed right AchA territory infarct as evidenced on MRI most likely secondary to small vessel disease  however embolic event cannot be ruled out given AchA is coming out directly from carotid artery.  CTA head/neck negative E LVO but did show arthrosclerosis.  MRI showed right basal ganglia and corona radiata infarct at right AchA territory.  2D echo showed an EF of 60 to 65%.  Recommended 30-day cardiac event monitor outpatient to rule out A. fib due to recent stroke and history of intermittent heart palpitations.  LDL 144.  A1c 7.2.  Recommended DAPT for 3 weeks and aspirin alone.  HTN stable.  History of statin intolerance and recommended PCP follow-up for continued Repatha.  Uncontrolled DM and recommend close PCP follow-up.  Other stroke risk factors include advanced age, family history of stroke, CAD and OSA on CPAP.  Other active problems include history of left breast cancer status postmastectomy 2015,  CKD stage III,  Hypothyroidism, secondary hyperparathyroidism and sensorineural hearing loss bilaterally.  Residual deficits of left hemiparesis and discharged to CIR for ongoing therapy.      ROS:   14 system review of systems performed and negative with exception of memory loss, confusion, and weakness  PMH:  Past Medical History:  Diagnosis Date  . Arthritis   . Asthma   . Breast cancer, left breast ([place]) 02/2014   DCIS, ER+ PR- initially rec bilat mastectomy with sentinal LN biopsy L axilla but now planning just L mastectomy [name])  . Chronic kidney disease, stage 3   . Colon polyps    benign  . Complication of anesthesia    hard  to wake up  . Coronary artery disease    30% stenosis reported on previous cardiac catheterization in 2006. This was done in [facility]  . COVID-19 virus infection 07/2019  . GERD (gastroesophageal reflux disease)   . Headaches, cluster   . Hyperlipidemia   . Hypertension   . Hypothyroidism   . Osteopenia 07/2014   DEXA 0.5 spine, 01.7 R femoral neck  . Pneumonia    years ago  . Secondary hyperparathyroidism of renal origin ([place])     likely  . Sensorineural hearing loss (SNHL) of both ears 01/27/2016   Audiology eval - moderately severe sensorineural hearing loss bilaterally rec amplification (07/2016 - [name] audiologist)  . Sleep apnea    on CPAP  . Thyroid disease   . Type 2 diabetes, uncontrolled, with neuropathy ([place])    followed by endo Dr Buddy Duty    PSH:  Past Surgical History:  Procedure Laterality Date  . ABDOMINAL HYSTERECTOMY     heavy bleeding, ovaries remain  . APPENDECTOMY    . BREAST BIOPSY Right    benign  . BREAST BIOPSY Left 02/2014   ?DCIS  . CARDIAC CATHETERIZATION  2006   [place], [place].   . CARDIAC CATHETERIZATION  10/2013   mild 2v nonobstructive disease, EF 60%  . CARDIOVASCULAR STRESS TEST  09/2012   WNL ([place])  . CHOLECYSTECTOMY    . COLONOSCOPY  05/2010   2 TAs, diverticulosis, rpt 2 yrs (Dr Elissa Hefty in [place])  . COLONOSCOPY  12/2014   1 TA, rpt 5 yrs Henrene Pastor)  . cortisone right knee  01/04/15  . GALLBLADDER SURGERY    . MASTECTOMY W/ SENTINEL NODE BIOPSY Left 06/07/2014   DR WAKEFIELD  . SIMPLE MASTECTOMY WITH AXILLARY SENTINEL NODE BIOPSY Left 06/07/2014   Procedure: LEFT TOTAL MASTECTOMY WITH LEFT AXILLARY SENTINEL NODE BIOPSY;  Surgeon: Rolm Bookbinder, MD;  Location: [facility];  Service: General;  Laterality: Left;    Social History:  Social History   Socioeconomic History  . Marital status: Widowed    Spouse name: Not on file  . Number of children: 0  . Years of education: Not on file  . Highest education level: Not on file  Occupational History  . Not on file  Tobacco Use  . Smoking status: Never Smoker  . Smokeless tobacco: Never Used  . Tobacco comment: second hand exposure  Vaping Use  . Vaping Use: Never used  Substance and Sexual Activity  . Alcohol use: No  . Drug use: No  . Sexual activity: Not on file  Other Topics Concern  . Not on file  Social History Narrative   Widow, husband Fritz Pickerel passed 67.  Has 3 dogs   No children.  Niece [name] supportive.    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, nutritional   Edu: 1 yr college   Activity: stationary bicycle, some walking   Diet: good water, fruits/vegetables daily      G0P0   Social Determinants of Radio broadcast assistant Strain: Low Risk   . Difficulty of Paying Living Expenses: Not hard at all  Food Insecurity: No Food Insecurity  . Worried About Charity fundraiser in the Last Year: Never true  . Ran Out of Food in the Last Year: Never true  Transportation Needs: No Transportation Needs  . Lack of Transportation (Medical): No  . Lack of Transportation (Non-Medical): No  Physical Activity:   . Days of Exercise  per Week:   . Minutes of Exercise per Session:   Stress:   . Feeling of Stress :   Social Connections:   . Frequency of Communication with Friends and Family:   . Frequency of Social Gatherings with Friends and Family:   . Attends Religious Services:   . Active Member of Clubs or Organizations:   . Attends Archivist Meetings:   Marland Kitchen Marital Status:   Intimate Partner Violence:   . Fear of Current or Ex-Partner:   . Emotionally Abused:   Marland Kitchen Physically Abused:   . Sexually Abused:     Family History:  Family History  Problem Relation Age of Onset  . Arthritis Mother   . Hyperlipidemia Mother   . Stroke Mother   . Hypertension Mother   . Diabetes Mother   . Other Mother        parathyroid adenoma  . Arthritis Father   . Hyperlipidemia Father   . CAD Father        MI  . Hypertension Father   . Breast cancer Other   . Ovarian cancer Other   . Leukemia Brother        dx in his 73s  . Colon cancer Maternal Uncle        dx in his 13s  . Ovarian cancer Maternal Grandmother 90  . Melanoma Other 27       dx 2x - 28 and 29/dx 3x - ages 62, 10, and 39  . Cancer Neg Hx   . Crohn's disease Neg Hx   . Colon polyps Neg Hx   . Ulcerative colitis Neg Hx   . Stomach cancer Neg Hx   . Rectal  cancer Neg Hx     Medications:   Current Outpatient Medications on File Prior to Visit  Medication Sig Dispense Refill  . acetaminophen (TYLENOL) 325 MG tablet Take 2 tablets (650 mg total) by mouth 3 (three) times daily as needed for mild pain (or temp > 37.5 C (99.5 F)).    Marland Kitchen albuterol (VENTOLIN HFA) 108 (90 Base) MCG/ACT inhaler Inhale 2 puffs into the lungs every 6 (six) hours as needed for wheezing or shortness of breath. 18 g 3  . amLODipine (NORVASC) 10 MG tablet Take 1 tablet (10 mg total) by mouth daily. 90 tablet 3  . Ascorbic Acid (VITAMIN C WITH ROSE HIPS) 500 MG tablet Take 500 mg by mouth daily.    Marland Kitchen aspirin EC 81 MG EC tablet Take 1 tablet (81 mg total) by mouth daily. 30 tablet 2  . baclofen (LIORESAL) 10 MG tablet Take 1 tablet (10 mg total) by mouth at bedtime. 30 each 3  . carvedilol (COREG) 6.25 MG tablet Take 1 tablet (6.25 mg total) by mouth 2 (two) times daily. 180 tablet 3  . famotidine (PEPCID) 20 MG tablet Take 1 tablet (20 mg total) by mouth at bedtime. 90 tablet 3  . Fluticasone-Salmeterol (ADVAIR) 250-50 MCG/DOSE AEPB Inhale 1 puff into the lungs 2 (two) times daily. 60 each 11  . hydrochlorothiazide (MICROZIDE) 12.5 MG capsule Take 1 capsule (12.5 mg total) by mouth daily. 90 capsule 3  . Insulin Glargine (LANTUS) 100 UNIT/ML Solostar Pen Inject 42 Units into the skin at bedtime. 45 mL 3  . Insulin Pen Needle (BD PEN NEEDLE NANO U/F) 32G X 4 MM MISC USE 1 AS DIRECTED TO INJECT MEDICATION DAILY. 100 each 3  . levothyroxine (SYNTHROID) 25 MCG tablet Take one tablet daily, take 2  tablets on Monday, wednesday, friday 120 tablet 3  . losartan (COZAAR) 100 MG tablet Take 1 tablet (100 mg total) by mouth daily. 90 tablet 3  . losartan (COZAAR) 50 MG tablet TAKE 1 TABLET BY MOUTH EVERY DAY 90 tablet 1  . Omega-3 Fatty Acids (OMEGA 3 PO) Take 1 capsule by mouth daily. 180 EPA/120DHA     . QUEtiapine (SEROQUEL) 50 MG tablet Take 1.5 tablets (75 mg total) by mouth at bedtime.  135 tablet 3  . REPATHA SURECLICK 073 MG/ML SOAJ INJECT 1 PEN INTO THE SKIN EVERY 14 DAYS. 2 pen 4  . sertraline (ZOLOFT) 100 MG tablet Take 1 tablet (100 mg total) by mouth at bedtime. 90 tablet 3   No current facility-administered medications on file prior to visit.    Allergies:   Allergies  Allergen Reactions  . Altace [Ramipril] Other (See Comments)    Upset stomach  . Aspirin Nausea And Vomiting  . Metformin And Related Other (See Comments)    GI upset     Physical Exam  Vitals:   01/23/20 1548  BP: 128/80  Pulse: 77  Height: 5\' 6"  (1.676 m)   Body mass index is 30.34 kg/m. No exam data present  General: well developed, well nourished, very pleasant elderly Caucasian female, seated, in no evident distress Head: head normocephalic and atraumatic.   Neck: supple with no carotid or supraclavicular bruits Cardiovascular: regular rate and rhythm, no murmurs Musculoskeletal: no deformity Skin:  no rash/petichiae Vascular:  Normal pulses all extremities   Neurologic Exam Mental Status: Awake and fully alert.   Mild dysarthria without aphasia noted.  Oriented to place and time.  Mood and affect appropriate.  Able to follow simple step commands. MMSE - Mini Mental State Exam 01/23/2020 09/26/2019 07/27/2019  Orientation to time 5 5 2   Orientation to Place 4 5 5   Registration 3 3 3   Attention/ Calculation 0 3 0  Recall 2 3 2   Language- name 2 objects 2 2 2   Language- repeat 1 1 1   Language- follow 3 step command 3 3 3   Language- read & follow direction 1 1 1   Write a sentence 0 1 1  Write a sentence-comments unable to write - -  Copy design 0 1 1  Copy design-comments unable to write - -  Total score 21 28 21    Cranial Nerves: Pupils equal, briskly reactive to light. Extraocular movements full without nystagmus. Visual fields full to confrontation. Hearing intact. Facial sensation intact.  Slight left lower facial weakness Motor:  LUE: 4/5 tricep with 3/5 bicep,  deltoid and grip strength with increased spasticity LLE: 4/5 hip flexor, knee extension and flexion and 3/5 ankle dorsiflexion.   Sensory.:  Sensation intact throughout all tested extremities Coordination: Rapid alternating movements normal in all extremities except decreased left hand dexterity. Finger-to-nose performed accurately on right side and heel-to-shin performed accurately on right side Gait and Station: Deferred as in wheelchair Reflexes: Brisk on left side; 1+ right side. Toes downgoing.          ASSESSMENT: Julia Banks is a 79 y.o. year old female presented with left-sided weakness and decreased sensation on 03/07/2019 with stroke work-up revealing right basal ganglia and corona radiata infarct at right AchA territory most likely secondary to small vessel disease however embolic source cannot be ruled out.  30-day cardiac event monitor negative for A. fib.  Vascular risk factors include HTN, HLD, DM, CAD and OSA on CPAP.    She  has been doing well from a stroke standpoint with improvement of left spastic hemiparesis as well as cognition with MMSE 28/30 (prior 21/30).  Continues to have fluctuation of dementia related behaviors with initiation of Seroquel 25 mg nightly and switching SSRI from Lexapro to sertraline    PLAN:  1. Vascular dementia with behaviors:  -MMSE 21/30 (07/2019 21/30; 08/2019 28/30 ) -Continue Seroquel 75 mg nightly and sertraline 100 mg nightly for ongoing management of behaviors and underlying depression -Discussed importance of managing stroke risk factors, maintaining adequate diet, ensuring restful sleep and good sleep hygiene and activity as tolerated 2. Hx of Right AchA territory stroke:  -Residual deficits: Left spastic hemiparesis, dysarthria and cognitive impairment -stable -Continue aspirin 81 mg daily and Repatha for secondary stroke prevention.   -Continue baclofen 5-10mg  nightly or as needed for spasticity -Ensure close PCP follow-up for  aggressive stroke risk factor management 3. HTN: BP goal<130/90.  Stable today.  Continue f/u with PCP 4. HLD: LDL goal <70.  Lipid panel today LDL 85. continuation of Repatha currently managed and monitored by cardiology.  History of statin and Zetia intolerance.  5. DMII: A1c goal <7.0.  A1c today 6.9.  Advised continued follow-up with PCP for monitoring and management 6. OSA on CPAP: Continuation of compliance    Follow up in 6 months or call earlier if needed   I spent 30 minutes of face-to-face and non-face-to-face time with patient and niece.  This included previsit chart review, lab review, study review, order entry, electronic health record documentation, performing and reviewing MMSE, discussion regarding vascular cognitive impairment with behaviors and patient/niece education regarding ongoing management of secondary stroke risk factor management and answered all questions to satisfaction   Frann Rider, [facility]  [facility] [address] [place], [place] [address]  Phone [phone] Fax [phone] Note: This document was prepared with digital dictation and possible smart phrase technology. Any transcriptional errors that result from this process are unintentional.

## 2020-01-23 NOTE — Patient Instructions (Signed)
Continue Seroquel 75 mg nightly and sertraline 100 mg nightly for dementia related behaviors and depression  Continue baclofen 5-10 mg nightly (or as needed) for post stroke spasticity  Continue aspirin 81 mg daily  and Repatha for secondary stroke prevention  Continue to follow up with PCP regarding cholesterol, blood pressure and diabetes management  Maintain strict control of hypertension with blood pressure goal below 130/90, diabetes with hemoglobin A1c goal below 6.5% and cholesterol with LDL cholesterol (bad cholesterol) goal below 70 mg/dL.       Followup in the future with me in 6 months or call earlier if needed       Thank you for coming to see [place] at [facility]. I hope we have been able to provide you high quality care today.  You may receive a patient satisfaction survey over the next few weeks. We would appreciate your feedback and comments so that we may continue to improve ourselves and the health of our patients.

## 2020-01-23 NOTE — Progress Notes (Signed)
I agree with the above plan 

## 2020-01-30 ENCOUNTER — Encounter: Payer: Self-pay | Admitting: Family Medicine

## 2020-01-30 ENCOUNTER — Telehealth (INDEPENDENT_AMBULATORY_CARE_PROVIDER_SITE_OTHER): Payer: Medicare Other | Admitting: Family Medicine

## 2020-01-30 VITALS — BP 130/80 | HR 86 | Temp 97.4°F | Ht 66.0 in

## 2020-01-30 DIAGNOSIS — N183 Chronic kidney disease, stage 3 unspecified: Secondary | ICD-10-CM | POA: Diagnosis not present

## 2020-01-30 DIAGNOSIS — E1140 Type 2 diabetes mellitus with diabetic neuropathy, unspecified: Secondary | ICD-10-CM

## 2020-01-30 DIAGNOSIS — I10 Essential (primary) hypertension: Secondary | ICD-10-CM

## 2020-01-30 DIAGNOSIS — E1122 Type 2 diabetes mellitus with diabetic chronic kidney disease: Secondary | ICD-10-CM

## 2020-01-30 DIAGNOSIS — E785 Hyperlipidemia, unspecified: Secondary | ICD-10-CM | POA: Diagnosis not present

## 2020-01-30 DIAGNOSIS — E1165 Type 2 diabetes mellitus with hyperglycemia: Secondary | ICD-10-CM

## 2020-01-30 DIAGNOSIS — F330 Major depressive disorder, recurrent, mild: Secondary | ICD-10-CM | POA: Diagnosis not present

## 2020-01-30 DIAGNOSIS — Z8673 Personal history of transient ischemic attack (TIA), and cerebral infarction without residual deficits: Secondary | ICD-10-CM

## 2020-01-30 DIAGNOSIS — E1169 Type 2 diabetes mellitus with other specified complication: Secondary | ICD-10-CM | POA: Diagnosis not present

## 2020-01-30 DIAGNOSIS — G4700 Insomnia, unspecified: Secondary | ICD-10-CM | POA: Diagnosis not present

## 2020-01-30 DIAGNOSIS — I69354 Hemiplegia and hemiparesis following cerebral infarction affecting left non-dominant side: Secondary | ICD-10-CM | POA: Diagnosis not present

## 2020-01-30 DIAGNOSIS — IMO0002 Reserved for concepts with insufficient information to code with codable children: Secondary | ICD-10-CM

## 2020-01-30 DIAGNOSIS — I2510 Atherosclerotic heart disease of native coronary artery without angina pectoris: Secondary | ICD-10-CM

## 2020-01-30 MED ORDER — LOSARTAN POTASSIUM 100 MG PO TABS: 100.0000 mg | ORAL_TABLET | Freq: Every day | ORAL | 3 refills | Status: DC

## 2020-01-30 NOTE — Assessment & Plan Note (Signed)
Chronic, stable. Continue current regimen. 

## 2020-01-30 NOTE — Assessment & Plan Note (Signed)
Cr has improved since last check. No med changes today.

## 2020-01-30 NOTE — Assessment & Plan Note (Signed)
Stable period on seroquel 75mg  nightly through neurology.

## 2020-01-30 NOTE — Assessment & Plan Note (Signed)
Doing well on repatha with LDL 84 - improvement. Ideally would want LDL <70. Continues fish oil daily.   The ASCVD Risk score Julia Bussing DC Jr., et al., 2013) failed to calculate for the following reasons:   The patient has a prior MI or stroke diagnosis

## 2020-01-30 NOTE — Assessment & Plan Note (Addendum)
Last saw PM&R 07/2019 - rehab delayed by fibular fracture with poor recovery.

## 2020-01-30 NOTE — Progress Notes (Signed)
Virtual visit completed through MyChart, a video enabled telemedicine application. Due to national recommendations of social distancing due to COVID-19, a virtual visit is felt to be most appropriate for this patient at this time. Reviewed limitations, risks, security and privacy concerns of performing a virtual visit and the availability of in person appointments. I also reviewed that there may be a patient responsible charge related to this service. The patient agreed to proceed.   Patient location: home Provider location: [place] at [facility], office Persons participating in this virtual visit: patient, provider, niece [name] and niece's husband Harrie Jeans   If any vitals were documented, they were collected by patient at home unless specified below.    BP 130/80   Pulse 86   Temp (!) 97.4 F (36.3 C)   Ht 5\' 6"  (1.676 m)   BMI 30.34 kg/m    CC: f/u visit  Subjective:    Patient ID: Julia Banks, female    DOB: 11-23-1940, 79 y.o.   MRN: [id number]  HPI: Julia Banks is a 79 y.o. female presenting on 01/30/2020 for Follow-up (6 mo f/u.)   Now living with niece and her husband Niece [name] helps with history.  No recent falls.   COVID19 07/2019 - some residual cognitive impairment - more forgetful since this. Mild cough persists. Using advair regularly, albuterol PRN.   Suffered stroke late last year - seeing neurology as well as [facility] with residual L hemiparesis and LUE paresthesia. Has seen cardiology, neurology and PMR this year. Diagnosed with vascular dementia with behavioral changes now on seroquel 75mg  nightly along with sertraline 25mg  daily. On repatha for HLD as well as CPAP for OSA. She had L fibular fracture - seen by [facility] ?stress fracture - slow recovery, this has slowed rehab. Upcoming cards appt 01/2020.   DM - does regularly check sugars 120-130 fasting. Compliant with antihyperglycemic regimen which includes: lantus 42 u daily. Denies low  sugars or hypoglycemic symptoms. Denies paresthesias. Last diabetic eye exam DUE- upcoming appointment already scheduled. Pneumovax: 2014. Prevnar: 2006. Glucometer brand: Relion. DSME:  Lab Results  Component Value Date   HGBA1C 6.9 (H) 01/23/2020   Diabetic Foot Exam - Simple   No data filed     Lab Results  Component Value Date   MICROALBUR 1.3 12/02/2013        Relevant past medical, surgical, family and social history reviewed and updated as indicated. Interim medical history since our last visit reviewed. Allergies and medications reviewed and updated. Outpatient Medications Prior to Visit  Medication Sig Dispense Refill  . acetaminophen (TYLENOL) 325 MG tablet Take 2 tablets (650 mg total) by mouth 3 (three) times daily as needed for mild pain (or temp > 37.5 C (99.5 F)).    Marland Kitchen albuterol (VENTOLIN HFA) 108 (90 Base) MCG/ACT inhaler Inhale 2 puffs into the lungs every 6 (six) hours as needed for wheezing or shortness of breath. 18 g 3  . amLODipine (NORVASC) 10 MG tablet Take 1 tablet (10 mg total) by mouth daily. 90 tablet 3  . Ascorbic Acid (VITAMIN C WITH ROSE HIPS) 500 MG tablet Take 500 mg by mouth daily.    Marland Kitchen aspirin EC 81 MG EC tablet Take 1 tablet (81 mg total) by mouth daily. 30 tablet 2  . baclofen (LIORESAL) 10 MG tablet Take 1 tablet (10 mg total) by mouth at bedtime. 30 each 3  . carvedilol (COREG) 6.25 MG tablet Take 1 tablet (6.25 mg total) by mouth  2 (two) times daily. 180 tablet 3  . famotidine (PEPCID) 20 MG tablet Take 1 tablet (20 mg total) by mouth at bedtime. 90 tablet 3  . Fluticasone-Salmeterol (ADVAIR) 250-50 MCG/DOSE AEPB Inhale 1 puff into the lungs 2 (two) times daily. 60 each 11  . hydrochlorothiazide (MICROZIDE) 12.5 MG capsule Take 1 capsule (12.5 mg total) by mouth daily. 90 capsule 3  . Insulin Glargine (LANTUS) 100 UNIT/ML Solostar Pen Inject 42 Units into the skin at bedtime. 45 mL 3  . Insulin Pen Needle (BD PEN NEEDLE NANO U/F) 32G X 4 MM MISC  USE 1 AS DIRECTED TO INJECT MEDICATION DAILY. 100 each 3  . levothyroxine (SYNTHROID) 25 MCG tablet Take one tablet daily, take 2 tablets on Monday, wednesday, friday 120 tablet 3  . Omega-3 Fatty Acids (OMEGA 3 PO) Take 1 capsule by mouth daily. 180 EPA/120DHA     . QUEtiapine (SEROQUEL) 50 MG tablet Take 1.5 tablets (75 mg total) by mouth at bedtime. 135 tablet 3  . REPATHA SURECLICK 470 MG/ML SOAJ INJECT 1 PEN INTO THE SKIN EVERY 14 DAYS. 2 pen 4  . sertraline (ZOLOFT) 100 MG tablet Take 1 tablet (100 mg total) by mouth at bedtime. 90 tablet 3  . losartan (COZAAR) 100 MG tablet Take 1 tablet (100 mg total) by mouth daily. 90 tablet 3  . losartan (COZAAR) 50 MG tablet TAKE 1 TABLET BY MOUTH EVERY DAY 90 tablet 1   No facility-administered medications prior to visit.     Per HPI unless specifically indicated in ROS section below Review of Systems Objective:  BP 130/80   Pulse 86   Temp (!) 97.4 F (36.3 C)   Ht 5\' 6"  (1.676 m)   BMI 30.34 kg/m   Wt Readings from Last 3 Encounters:  09/26/19 188 lb (85.3 kg)  08/16/19 188 lb (85.3 kg)  07/04/19 180 lb (81.6 kg)  No recent weight due to difficulty getting on scale    Physical exam: Gen: alert, NAD, not ill appearing Pulm: speaks in complete sentences without increased work of breathing Psych: normal mood, normal thought content      Results for orders placed or performed in visit on 01/23/20  Renal function panel  Result Value Ref Range   Sodium 137 135 - 145 mEq/L   Potassium 4.3 3.5 - 5.1 mEq/L   Chloride 104 96 - 112 mEq/L   CO2 28 19 - 32 mEq/L   Albumin 4.0 3.5 - 5.2 g/dL   BUN 15 6 - 23 mg/dL   Creatinine, Ser 1.28 (H) 0.40 - 1.20 mg/dL   Glucose, Bld 84 70 - 99 mg/dL   Phosphorus 3.4 2.3 - 4.6 mg/dL   GFR 40.44 (L) >60.00 mL/min   Calcium 9.4 8.4 - 10.5 mg/dL  Hemoglobin A1c  Result Value Ref Range   Hgb A1c MFr Bld 6.9 (H) 4.6 - 6.5 %  Lipid panel  Result Value Ref Range   Cholesterol 166 0 - 200 mg/dL    Triglycerides 253.0 (H) 0 - 149 mg/dL   HDL 40.20 >39.00 mg/dL   VLDL 50.6 (H) 0.0 - 40.0 mg/dL   Total CHOL/HDL Ratio 4    NonHDL 125.79   LDL cholesterol, direct  Result Value Ref Range   Direct LDL 85.0 mg/dL   Assessment & Plan:   Problem List Items Addressed This Visit    Type 2 diabetes, uncontrolled, with neuropathy ([place]) - Primary    Chronic, stable, good control based on recent  A1c. Continue current regimen of lantus 43u daily.       Relevant Medications   losartan (COZAAR) 100 MG tablet   MDD (major depressive disorder), recurrent episode, mild (HCC)    Stable period on sertraline 25mg  daily.       Insomnia    Stable period on seroquel 75mg  nightly through neurology.       Hyperlipidemia associated with type 2 diabetes mellitus ([place])    Doing well on repatha with LDL 84 - improvement. Ideally would want LDL <70. Continues fish oil daily.   The ASCVD Risk score Mikey Bussing DC Jr., et al., 2013) failed to calculate for the following reasons:   The patient has a prior MI or stroke diagnosis       Relevant Medications   losartan (COZAAR) 100 MG tablet   History of cerebrovascular accident (CVA) due to ischemia    Continue aspirin, repatha.       Hemiparesis affecting left side as late effect of cerebrovascular accident (CVA) ([place])    Last saw PM&R 07/2019 - rehab delayed by fibular fracture with poor recovery.       Essential hypertension, benign    Chronic, stable. Continue current regimen.       Relevant Medications   losartan (COZAAR) 100 MG tablet   Diabetes mellitus with stage 3 chronic kidney disease (HCC)    Cr has improved since last check. No med changes today.       Relevant Medications   losartan (COZAAR) 100 MG tablet       Meds ordered this encounter  Medications  . losartan (COZAAR) 100 MG tablet    Sig: Take 1 tablet (100 mg total) by mouth daily.    Dispense:  90 tablet    Refill:  3   No orders of the defined types were placed in this  encounter.   I discussed the assessment and treatment plan with the patient. The patient was provided an opportunity to ask questions and all were answered. The patient agreed with the plan and demonstrated an understanding of the instructions. The patient was advised to call back or seek an in-person evaluation if the symptoms worsen or if the condition fails to improve as anticipated.  Follow up plan: Return in about 4 months (around 05/31/2020) for medicare wellness visit.  Ria Bush, MD

## 2020-01-30 NOTE — Assessment & Plan Note (Signed)
Continue aspirin, repatha.

## 2020-01-30 NOTE — Assessment & Plan Note (Addendum)
Chronic, stable, good control based on recent A1c. Continue current regimen of lantus 43u daily.

## 2020-01-30 NOTE — Assessment & Plan Note (Signed)
Stable period on sertraline 25mg  daily.

## 2020-02-13 ENCOUNTER — Ambulatory Visit: Payer: Medicare Other

## 2020-02-13 ENCOUNTER — Encounter: Payer: Self-pay | Admitting: Family Medicine

## 2020-02-13 ENCOUNTER — Telehealth: Payer: Self-pay | Admitting: Cardiovascular Disease

## 2020-02-13 NOTE — Telephone Encounter (Signed)
  Patient Consent for Virtual Visit         Julia Banks has provided verbal consent on 02/13/2020 for a virtual visit (video or telephone).   CONSENT FOR VIRTUAL VISIT FOR:  Julia Banks  By participating in this virtual visit I agree to the following:  I hereby voluntarily request, consent and authorize [facility] and its employed or contracted physicians, physician assistants, nurse practitioners or other licensed health care professionals (the Practitioner), to provide me with telemedicine health care services (the "Services") as deemed necessary by the treating Practitioner. I acknowledge and consent to receive the Services by the Practitioner via telemedicine. I understand that the telemedicine visit will involve communicating with the Practitioner through live audiovisual communication technology and the disclosure of certain medical information by electronic transmission. I acknowledge that I have been given the opportunity to request an in-person assessment or other available alternative prior to the telemedicine visit and am voluntarily participating in the telemedicine visit.  I understand that I have the right to withhold or withdraw my consent to the use of telemedicine in the course of my care at any time, without affecting my right to future care or treatment, and that the Practitioner or I may terminate the telemedicine visit at any time. I understand that I have the right to inspect all information obtained and/or recorded in the course of the telemedicine visit and may receive copies of available information for a reasonable fee.  I understand that some of the potential risks of receiving the Services via telemedicine include:  Marland Kitchen Delay or interruption in medical evaluation due to technological equipment failure or disruption; . Information transmitted may not be sufficient (e.g. poor resolution of images) to allow for appropriate medical decision making by the  Practitioner; and/or  . In rare instances, security protocols could fail, causing a breach of personal health information.  Furthermore, I acknowledge that it is my responsibility to provide information about my medical history, conditions and care that is complete and accurate to the best of my ability. I acknowledge that Practitioner's advice, recommendations, and/or decision may be based on factors not within their control, such as incomplete or inaccurate data provided by me or distortions of diagnostic images or specimens that may result from electronic transmissions. I understand that the practice of medicine is not an exact science and that Practitioner makes no warranties or guarantees regarding treatment outcomes. I acknowledge that a copy of this consent can be made available to me via my patient portal ([place]), or I can request a printed copy by calling the office of [place].    I understand that my insurance will be billed for this visit.   I have read or had this consent read to me. . I understand the contents of this consent, which adequately explains the benefits and risks of the Services being provided via telemedicine.  . I have been provided ample opportunity to ask questions regarding this consent and the Services and have had my questions answered to my satisfaction. . I give my informed consent for the services to be provided through the use of telemedicine in my medical care

## 2020-02-14 ENCOUNTER — Encounter: Payer: Self-pay | Admitting: Cardiovascular Disease

## 2020-02-14 ENCOUNTER — Telehealth (INDEPENDENT_AMBULATORY_CARE_PROVIDER_SITE_OTHER): Payer: Medicare Other | Admitting: Cardiovascular Disease

## 2020-02-14 DIAGNOSIS — I2510 Atherosclerotic heart disease of native coronary artery without angina pectoris: Secondary | ICD-10-CM | POA: Diagnosis not present

## 2020-02-14 NOTE — Patient Instructions (Signed)
Medication Instructions:  Continue same medications *If you need a refill on your cardiac medications before your next appointment, please call your pharmacy*   Lab Work: None If you have labs (blood work) drawn today and your tests are completely normal, you will receive your results only by: Marland Kitchen MyChart Message (if you have MyChart) OR . A paper copy in the mail If you have any lab test that is abnormal or we need to change your treatment, we will call you to review the results.   Testing/Procedures: None   Follow-Up: At [facility], you and your health needs are our priority.  As part of our continuing mission to provide you with exceptional heart care, we have created designated Provider Care Teams.  These Care Teams include your primary Cardiologist (physician) and Advanced Practice Providers (APPs -  Physician Assistants and Nurse Practitioners) who all work together to provide you with the care you need, when you need it.  We recommend signing up for the patient portal called "MyChart".  Sign up information is provided on this After Visit Summary.  MyChart is used to connect with patients for Virtual Visits (Telemedicine).  Patients are able to view lab/test results, encounter notes, upcoming appointments, etc.  Non-urgent messages can be sent to your provider as well.   To learn more about what you can do with MyChart, go to [email or web address].    Your next appointment:   6 month(s)  The format for your next appointment:   In Person  Provider:    You may see Dr. Fletcher Anon or one of the following Advanced Practice Providers on your designated Care Team:    Murray Hodgkins, NP  Christell Faith, PA-C  Marrianne Mood, PA-C    Other Instructions

## 2020-02-14 NOTE — Progress Notes (Signed)
Virtual Visit via Telephone Note   This visit type was conducted due to national recommendations for restrictions regarding the COVID-19 Pandemic (e.g. social distancing) in an effort to limit this patient's exposure and mitigate transmission in our community.  Due to her co-morbid illnesses, this patient is at least at moderate risk for complications without adequate follow up.  This format is felt to be most appropriate for this patient at this time.  The patient did not have access to video technology/had technical difficulties with video requiring transitioning to audio format only (telephone).  All issues noted in this document were discussed and addressed.  No physical exam could be performed with this format.  Please refer to the patient's chart for her  consent to telehealth for [facility].    Date:  02/14/2020   ID:  Julia, Banks 1940/12/17, MRN [id number] The patient was identified using 2 identifiers.  Patient Location: Home Provider Location: Office/Clinic  PCP:  Ria Bush, MD  Cardiologist: Fletcher Anon Electrophysiologist:  None   Evaluation Performed:  Follow-Up Visit  Chief Complaint: Doing well with no complaints  History of Present Illness:    Julia Banks is a 79 y.o. female who was reached via phone for a follow-up visit regarding mild nonobstructive coronary artery disease. She has multiple chronic medical conditions including diabetes, hypertension, chronic kidney disease, hyperlipidemia and obesity.  She was seen by me in 2015 for nocturnal chest pain worrisome for angina.  Cardiac catheterization at that time showed mild nonobstructive 2 vessel coronary artery disease (20% in the left circumflex and 30% in the RCA). Ejection fraction was normal.  It was felt that some of her nocturnal chest pain might have been related to sleep apnea and improved with using CPAP.  She had a stroke in September 2020.  MRI of the brain showed acute infarct at the  right basal ganglia and corona radiata.  Echocardiogram showed an EF of 60 to 65% with no significant valvular abnormalities.  The stroke was determined to be most likely due to small vessel disease.  However, embolic event could not be ruled out.  She was discharged to inpatient rehab where she stayed for few weeks.30-day outpatient monitor showed no evidence of atrial fibrillation.    She has been doing well with no recent chest pain, shortness of breath or palpitations. She continues to have left-sided weakness. She takes her medications regularly.    Past Medical History:  Diagnosis Date  . Arthritis   . Asthma   . Breast cancer, left breast ([place]) 02/2014   DCIS, ER+ PR- initially rec bilat mastectomy with sentinal LN biopsy L axilla but now planning just L mastectomy [name])  . Chronic kidney disease, stage 3   . Colon polyps    benign  . Complication of anesthesia    hard to wake up  . Coronary artery disease    30% stenosis reported on previous cardiac catheterization in 2006. This was done in [facility]  . COVID-19 virus infection 07/2019  . GERD (gastroesophageal reflux disease)   . Headaches, cluster   . Hyperlipidemia   . Hypertension   . Hypothyroidism   . Osteopenia 07/2014   DEXA 0.5 spine, 01.7 R femoral neck  . Pneumonia    years ago  . Secondary hyperparathyroidism of renal origin ([place])    likely  . Sensorineural hearing loss (SNHL) of both ears 01/27/2016   Audiology eval - moderately severe sensorineural hearing loss bilaterally rec amplification (  07/2016 - [name] audiologist)  . Sleep apnea    on CPAP  . Thyroid disease   . Type 2 diabetes, uncontrolled, with neuropathy ([place])    followed by endo Dr Buddy Duty   Past Surgical History:  Procedure Laterality Date  . ABDOMINAL HYSTERECTOMY     heavy bleeding, ovaries remain  . APPENDECTOMY    . BREAST BIOPSY Right    benign  . BREAST BIOPSY Left 02/2014   ?DCIS  . CARDIAC  CATHETERIZATION  2006   [place], [place].   . CARDIAC CATHETERIZATION  10/2013   mild 2v nonobstructive disease, EF 60%  . CARDIOVASCULAR STRESS TEST  09/2012   WNL (Dejuan Elman)  . CHOLECYSTECTOMY    . COLONOSCOPY  05/2010   2 TAs, diverticulosis, rpt 2 yrs (Dr Elissa Hefty in [place])  . COLONOSCOPY  12/2014   1 TA, rpt 5 yrs Henrene Pastor)  . cortisone right knee  01/04/15  . GALLBLADDER SURGERY    . MASTECTOMY W/ SENTINEL NODE BIOPSY Left 06/07/2014   DR WAKEFIELD  . SIMPLE MASTECTOMY WITH AXILLARY SENTINEL NODE BIOPSY Left 06/07/2014   Procedure: LEFT TOTAL MASTECTOMY WITH LEFT AXILLARY SENTINEL NODE BIOPSY;  Surgeon: Rolm Bookbinder, MD;  Location: [place];  Service: General;  Laterality: Left;     Current Meds  Medication Sig  . acetaminophen (TYLENOL) 325 MG tablet Take 2 tablets (650 mg total) by mouth 3 (three) times daily as needed for mild pain (or temp > 37.5 C (99.5 F)).  Marland Kitchen albuterol (VENTOLIN HFA) 108 (90 Base) MCG/ACT inhaler Inhale 2 puffs into the lungs every 6 (six) hours as needed for wheezing or shortness of breath.  Marland Kitchen amLODipine (NORVASC) 10 MG tablet Take 1 tablet (10 mg total) by mouth daily.  . Ascorbic Acid (VITAMIN C WITH ROSE HIPS) 500 MG tablet Take 500 mg by mouth daily.  Marland Kitchen aspirin EC 81 MG EC tablet Take 1 tablet (81 mg total) by mouth daily.  . baclofen (LIORESAL) 10 MG tablet Take 1 tablet (10 mg total) by mouth at bedtime.  . carvedilol (COREG) 6.25 MG tablet Take 1 tablet (6.25 mg total) by mouth 2 (two) times daily.  . famotidine (PEPCID) 20 MG tablet Take 1 tablet (20 mg total) by mouth at bedtime.  . Fluticasone-Salmeterol (ADVAIR) 250-50 MCG/DOSE AEPB Inhale 1 puff into the lungs 2 (two) times daily.  . hydrochlorothiazide (MICROZIDE) 12.5 MG capsule Take 1 capsule (12.5 mg total) by mouth daily.  . Insulin Glargine (LANTUS) 100 UNIT/ML Solostar Pen Inject 42 Units into the skin at bedtime.  . Insulin Pen Needle (BD PEN NEEDLE NANO U/F) 32G X 4 MM MISC USE 1 AS DIRECTED  TO INJECT MEDICATION DAILY.  Marland Kitchen levothyroxine (SYNTHROID) 25 MCG tablet Take one tablet daily, take 2 tablets on Monday, wednesday, friday  . losartan (COZAAR) 100 MG tablet Take 1 tablet (100 mg total) by mouth daily.  . Omega-3 Fatty Acids (OMEGA 3 PO) Take 1 capsule by mouth daily. 180 EPA/120DHA   . QUEtiapine (SEROQUEL) 50 MG tablet Take 1.5 tablets (75 mg total) by mouth at bedtime.  Marland Kitchen REPATHA SURECLICK 366 MG/ML SOAJ INJECT 1 PEN INTO THE SKIN EVERY 14 DAYS.  Marland Kitchen sertraline (ZOLOFT) 100 MG tablet Take 1 tablet (100 mg total) by mouth at bedtime.     Allergies:   Altace [ramipril], Aspirin, and Metformin and related   Social History   Tobacco Use  . Smoking status: Never Smoker  . Smokeless tobacco: Never Used  .  Tobacco comment: second hand exposure  Vaping Use  . Vaping Use: Never used  Substance Use Topics  . Alcohol use: No  . Drug use: No     Family Hx: The patient's family history includes Arthritis in her father and mother; Breast cancer in an other family member; CAD in her father; Colon cancer in her maternal uncle; Diabetes in her mother; Hyperlipidemia in her father and mother; Hypertension in her father and mother; Leukemia in her brother; Melanoma (age of onset: 59) in an other family member; Other in her mother; Ovarian cancer in an other family member; Ovarian cancer (age of onset: 61) in her maternal grandmother; Stroke in her mother. There is no history of Cancer, Crohn's disease, Colon polyps, Ulcerative colitis, Stomach cancer, or Rectal cancer.  ROS:   Please see the history of present illness.     All other systems reviewed and are negative.   Prior CV studies:   The following studies were reviewed today:    Labs/Other Tests and Data Reviewed:    EKG:  No ECG reviewed.  Recent Labs: 07/27/2019: ALT 8; Hemoglobin 12.0; Platelets 198; TSH 2.990 01/23/2020: BUN 15; Creatinine, Ser 1.28; Potassium 4.3; Sodium 137   Recent Lipid Panel Lab Results   Component Value Date/Time   CHOL 166 01/23/2020 08:30 AM   CHOL 149 07/27/2019 01:44 PM   TRIG 253.0 (H) 01/23/2020 08:30 AM   HDL 40.20 01/23/2020 08:30 AM   HDL 39 (L) 07/27/2019 01:44 PM   CHOLHDL 4 01/23/2020 08:30 AM   LDLCALC 73 07/27/2019 01:44 PM   LDLDIRECT 85.0 01/23/2020 08:30 AM    Wt Readings from Last 3 Encounters:  09/26/19 188 lb (85.3 kg)  08/16/19 188 lb (85.3 kg)  07/04/19 180 lb (81.6 kg)     Objective:    Vital Signs:  BP 130/80    VITAL SIGNS:  reviewed  ASSESSMENT & PLAN:    1.  Coronary artery disease involving native coronary arteries without angina: Previous cardiac catheterization showed nonobstructive coronary artery disease.  Currently with no anginal symptoms.  Continue medical therapy.  2.  Essential hypertension: Blood pressure is now well controlled on current medications.  3.  Hyperlipidemia with intolerance to statins: She is doing well on Repatha. Most recent LDL was 85.  4.  History of stroke: Some cognitive decline after that.  Continue follow-up with neurology.  5. Diabetes mellitus: Improved overall with most recent hemoglobin A1c of 6.9.  COVID-19 Education: The signs and symptoms of COVID-19 were discussed with the patient and how to seek care for testing (follow up with PCP or arrange E-visit).  The importance of social distancing was discussed today.  Time:   Today, I have spent 8 minutes with the patient with telehealth technology discussing the above problems.     Medication Adjustments/Labs and Tests Ordered: Current medicines are reviewed at length with the patient today.  Concerns regarding medicines are outlined above.   Tests Ordered: No orders of the defined types were placed in this encounter.   Medication Changes: No orders of the defined types were placed in this encounter.   Follow Up:  In Person in 6 month(s)  Signed, Kathlyn Sacramento, MD  02/14/2020 2:23 PM    [place]

## 2020-02-15 ENCOUNTER — Ambulatory Visit: Payer: Medicare Other

## 2020-02-23 ENCOUNTER — Encounter: Payer: Self-pay | Admitting: Adult Health

## 2020-02-28 MED ORDER — QUETIAPINE FUMARATE 50 MG PO TABS: 50.0000 mg | ORAL_TABLET | Freq: Two times a day (BID) | ORAL | 3 refills | Status: DC

## 2020-02-28 NOTE — Addendum Note (Signed)
Addended by: Mal Misty on: 02/28/2020 04:52 PM   Modules accepted: Orders

## 2020-03-12 NOTE — Telephone Encounter (Signed)
FL2 and in Lisa's box.

## 2020-03-12 NOTE — Telephone Encounter (Signed)
Noted.  Form is in basket on Lisa's desk pending response from pt as to where to send it.

## 2020-03-15 NOTE — Telephone Encounter (Signed)
Faxed form to [facility] at [phone].

## 2020-04-08 ENCOUNTER — Other Ambulatory Visit: Payer: Self-pay | Admitting: Family Medicine

## 2020-04-13 ENCOUNTER — Other Ambulatory Visit: Payer: Self-pay | Admitting: Cardiovascular Disease

## 2020-04-17 ENCOUNTER — Other Ambulatory Visit: Payer: Self-pay | Admitting: Cardiovascular Disease

## 2020-04-26 ENCOUNTER — Telehealth: Payer: Self-pay | Admitting: *Deleted

## 2020-04-26 ENCOUNTER — Encounter: Payer: Self-pay | Admitting: *Deleted

## 2020-04-26 NOTE — Telephone Encounter (Signed)
Pt requiring PA for Repatha 140 mg/ml.  PA submitted through Covermymeds.  PA has been approved.  Message from Plan Start Date: 03/27/2020--Coverage End Date:04/26/2023

## 2020-05-15 ENCOUNTER — Other Ambulatory Visit: Payer: Self-pay | Admitting: Cardiovascular Disease

## 2020-05-15 ENCOUNTER — Other Ambulatory Visit: Payer: Self-pay | Admitting: Family Medicine

## 2020-06-02 ENCOUNTER — Other Ambulatory Visit: Payer: Self-pay

## 2020-06-02 ENCOUNTER — Other Ambulatory Visit: Payer: Self-pay | Admitting: Family Medicine

## 2020-06-04 ENCOUNTER — Encounter: Payer: Self-pay | Admitting: Adult Health

## 2020-06-04 ENCOUNTER — Encounter: Payer: Self-pay | Admitting: Family Medicine

## 2020-06-04 MED ORDER — CARVEDILOL 6.25 MG PO TABS
6.2500 mg | ORAL_TABLET | Freq: Two times a day (BID) | ORAL | 0 refills | Status: DC
Start: 2020-06-04 — End: 2020-10-08

## 2020-06-04 MED ORDER — QUETIAPINE FUMARATE 50 MG PO TABS: ORAL_TABLET | ORAL | 3 refills | Status: AC

## 2020-06-04 MED ORDER — INSULIN GLARGINE 100 UNIT/ML SOLOSTAR PEN
42.0000 [IU] | PEN_INJECTOR | Freq: Every day | SUBCUTANEOUS | 3 refills | Status: DC
Start: 2020-06-04 — End: 2020-08-14

## 2020-06-04 NOTE — Telephone Encounter (Signed)
E-scribed refill 

## 2020-06-05 ENCOUNTER — Encounter: Payer: Self-pay | Admitting: Family Medicine

## 2020-06-05 MED ORDER — ACETAMINOPHEN 325 MG PO TABS: 650.0000 mg | ORAL_TABLET | Freq: Three times a day (TID) | ORAL | 0 refills | Status: AC | PRN

## 2020-06-05 NOTE — Telephone Encounter (Signed)
Left vm for the patient to call back to schedule cpe, mwv, and labs. NO details left on vm EM Will send a letter to patient EM

## 2020-06-05 NOTE — Telephone Encounter (Signed)
Pharmacy requests refill on: Acetaminophen 325 mg   LAST REFILL: 05/30/2019  LAST OV: 05/30/2019 NEXT OV: Not Scheduled  PHARMACY: [facility] [phone] in [place], [place]

## 2020-06-13 NOTE — Telephone Encounter (Signed)
Called and left vm for the patient to call back to schedule. EM

## 2020-06-15 ENCOUNTER — Other Ambulatory Visit: Payer: Self-pay | Admitting: Family Medicine

## 2020-06-27 ENCOUNTER — Other Ambulatory Visit: Payer: Self-pay | Admitting: Family Medicine

## 2020-06-27 NOTE — Telephone Encounter (Signed)
Pharmacy requests refill on: Famotidine 20 mg   LAST REFILL: 05/30/2019 (Q-90, R-3) LAST OV: 01/30/2020 NEXT OV: Not Scheduled  PHARMACY: [id number] in [place], [place]

## 2020-07-26 ENCOUNTER — Telehealth: Payer: Self-pay | Admitting: Family Medicine

## 2020-07-26 NOTE — Telephone Encounter (Signed)
LVM for pt to rtn my call to schedule AWV with NHA.  

## 2020-07-30 ENCOUNTER — Ambulatory Visit: Payer: Medicare Other | Admitting: Adult Health

## 2020-07-30 ENCOUNTER — Ambulatory Visit: Payer: PRIVATE HEALTH INSURANCE | Admitting: Adult Health

## 2020-08-02 NOTE — Telephone Encounter (Signed)
Please schedule AMW with health coach.  Ok to schedule virtual f/u visit after this.

## 2020-08-13 ENCOUNTER — Encounter: Payer: Self-pay | Admitting: Family Medicine

## 2020-08-13 DIAGNOSIS — E1140 Type 2 diabetes mellitus with diabetic neuropathy, unspecified: Secondary | ICD-10-CM

## 2020-08-13 DIAGNOSIS — IMO0002 Reserved for concepts with insufficient information to code with codable children: Secondary | ICD-10-CM

## 2020-08-14 MED ORDER — INSULIN GLARGINE 100 UNIT/ML ~~LOC~~ SOLN: 42.0000 [IU] | Freq: Every day | SUBCUTANEOUS | 3 refills | Status: DC

## 2020-08-14 MED ORDER — INSULIN GLARGINE 100 UNIT/ML SOLOSTAR PEN
42.0000 [IU] | PEN_INJECTOR | Freq: Every day | SUBCUTANEOUS | 3 refills | Status: DC
Start: 2020-08-14 — End: 2020-08-14

## 2020-08-14 MED ORDER — "INSULIN SYRINGE-NEEDLE U-100 31G X 15/64"" 1 ML MISC": 3 refills | Status: AC

## 2020-08-14 NOTE — Telephone Encounter (Signed)
E-scribed insulin syringes/needles.

## 2020-08-14 NOTE — Telephone Encounter (Signed)
I've sent in glargine (lantus) to [facility].  Please send in syringes.

## 2020-08-14 NOTE — Addendum Note (Signed)
Addended by: Brenton Grills on: 4/49/2524 15:90 AM   Modules accepted: Orders

## 2020-09-17 ENCOUNTER — Encounter: Payer: Self-pay | Admitting: Adult Health

## 2020-09-18 ENCOUNTER — Ambulatory Visit: Payer: Medicare Other | Admitting: Adult Health

## 2020-10-02 ENCOUNTER — Ambulatory Visit: Payer: Medicare Other

## 2020-10-02 ENCOUNTER — Other Ambulatory Visit: Payer: Self-pay | Admitting: Family Medicine

## 2020-10-02 DIAGNOSIS — E1169 Type 2 diabetes mellitus with other specified complication: Secondary | ICD-10-CM

## 2020-10-02 DIAGNOSIS — N183 Chronic kidney disease, stage 3 unspecified: Secondary | ICD-10-CM

## 2020-10-02 DIAGNOSIS — E1122 Type 2 diabetes mellitus with diabetic chronic kidney disease: Secondary | ICD-10-CM

## 2020-10-02 DIAGNOSIS — IMO0002 Reserved for concepts with insufficient information to code with codable children: Secondary | ICD-10-CM

## 2020-10-02 DIAGNOSIS — E1140 Type 2 diabetes mellitus with diabetic neuropathy, unspecified: Secondary | ICD-10-CM

## 2020-10-02 DIAGNOSIS — E785 Hyperlipidemia, unspecified: Secondary | ICD-10-CM

## 2020-10-02 DIAGNOSIS — N2581 Secondary hyperparathyroidism of renal origin: Secondary | ICD-10-CM

## 2020-10-02 DIAGNOSIS — Z1159 Encounter for screening for other viral diseases: Secondary | ICD-10-CM

## 2020-10-02 DIAGNOSIS — E039 Hypothyroidism, unspecified: Secondary | ICD-10-CM

## 2020-10-04 ENCOUNTER — Other Ambulatory Visit: Payer: Self-pay

## 2020-10-04 ENCOUNTER — Other Ambulatory Visit (INDEPENDENT_AMBULATORY_CARE_PROVIDER_SITE_OTHER): Payer: Medicare Other

## 2020-10-04 DIAGNOSIS — N183 Chronic kidney disease, stage 3 unspecified: Secondary | ICD-10-CM

## 2020-10-04 DIAGNOSIS — E039 Hypothyroidism, unspecified: Secondary | ICD-10-CM | POA: Diagnosis not present

## 2020-10-04 DIAGNOSIS — E1169 Type 2 diabetes mellitus with other specified complication: Secondary | ICD-10-CM | POA: Diagnosis not present

## 2020-10-04 DIAGNOSIS — N2581 Secondary hyperparathyroidism of renal origin: Secondary | ICD-10-CM

## 2020-10-04 DIAGNOSIS — IMO0002 Reserved for concepts with insufficient information to code with codable children: Secondary | ICD-10-CM

## 2020-10-04 DIAGNOSIS — E1165 Type 2 diabetes mellitus with hyperglycemia: Secondary | ICD-10-CM

## 2020-10-04 DIAGNOSIS — E1140 Type 2 diabetes mellitus with diabetic neuropathy, unspecified: Secondary | ICD-10-CM

## 2020-10-04 DIAGNOSIS — E1122 Type 2 diabetes mellitus with diabetic chronic kidney disease: Secondary | ICD-10-CM

## 2020-10-04 DIAGNOSIS — Z1159 Encounter for screening for other viral diseases: Secondary | ICD-10-CM | POA: Diagnosis not present

## 2020-10-04 DIAGNOSIS — E785 Hyperlipidemia, unspecified: Secondary | ICD-10-CM

## 2020-10-04 LAB — COMPREHENSIVE METABOLIC PANEL
ALT: 9 U/L (ref 0–35)
AST: 12 U/L (ref 0–37)
Albumin: 3.9 g/dL (ref 3.5–5.2)
Alkaline Phosphatase: 85 U/L (ref 39–117)
BUN: 17 mg/dL (ref 6–23)
CO2: 28 mEq/L (ref 19–32)
Calcium: 9.5 mg/dL (ref 8.4–10.5)
Chloride: 103 mEq/L (ref 96–112)
Creatinine, Ser: 1.2 mg/dL (ref 0.40–1.20)
GFR: 43.63 mL/min — ABNORMAL LOW (ref >60.00)
Glucose, Bld: 152 mg/dL — ABNORMAL HIGH (ref 70–99)
Potassium: 3.9 mEq/L (ref 3.5–5.1)
Sodium: 139 mEq/L (ref 135–145)
Total Bilirubin: 0.4 mg/dL (ref 0.2–1.2)
Total Protein: 6.8 g/dL (ref 6.0–8.3)

## 2020-10-04 LAB — LDL CHOLESTEROL, DIRECT: Direct LDL: 185 mg/dL

## 2020-10-04 LAB — CBC WITH DIFFERENTIAL/PLATELET
Basophils Absolute: 0 10*3/uL (ref 0.0–0.1)
Basophils Relative: 0.6 % (ref 0.0–3.0)
Eosinophils Absolute: 0.5 10*3/uL (ref 0.0–0.7)
Eosinophils Relative: 6.2 % — ABNORMAL HIGH (ref 0.0–5.0)
HCT: 39 % (ref 36.0–46.0)
Hemoglobin: 13.3 g/dL (ref 12.0–15.0)
Lymphocytes Relative: 26.8 % (ref 12.0–46.0)
Lymphs Abs: 2.1 10*3/uL (ref 0.7–4.0)
MCHC: 34.1 g/dL (ref 30.0–36.0)
MCV: 83.8 fl (ref 78.0–100.0)
Monocytes Absolute: 0.4 10*3/uL (ref 0.1–1.0)
Monocytes Relative: 5 % (ref 3.0–12.0)
Neutro Abs: 4.9 10*3/uL (ref 1.4–7.7)
Neutrophils Relative %: 61.4 % (ref 43.0–77.0)
Platelets: 187 10*3/uL (ref 150.0–400.0)
RBC: 4.65 Mil/uL (ref 3.87–5.11)
RDW: 15.2 % (ref 11.5–15.5)
WBC: 7.9 10*3/uL (ref 4.0–10.5)

## 2020-10-04 LAB — HEMOGLOBIN A1C: Hgb A1c MFr Bld: 7.2 % — ABNORMAL HIGH (ref 4.6–6.5)

## 2020-10-04 LAB — TSH: TSH: 5.09 u[IU]/mL — ABNORMAL HIGH (ref 0.35–4.50)

## 2020-10-04 LAB — LIPID PANEL
Cholesterol: 323 mg/dL — ABNORMAL HIGH (ref 0–200)
HDL: 39.5 mg/dL (ref >39.00)
NonHDL: 283.61
Total CHOL/HDL Ratio: 8
Triglycerides: 383 mg/dL — ABNORMAL HIGH (ref 0.0–149.0)
VLDL: 76.6 mg/dL — ABNORMAL HIGH (ref 0.0–40.0)

## 2020-10-04 LAB — VITAMIN D 25 HYDROXY (VIT D DEFICIENCY, FRACTURES): VITD: 15.08 ng/mL — ABNORMAL LOW (ref 30.00–100.00)

## 2020-10-05 LAB — HEPATITIS C ANTIBODY
Hepatitis C Ab: NONREACTIVE
SIGNAL TO CUT-OFF: 0.01 (ref <1.00)

## 2020-10-05 LAB — PARATHYROID HORMONE, INTACT (NO CA): PTH: 41 pg/mL (ref 16–77)

## 2020-10-06 ENCOUNTER — Other Ambulatory Visit: Payer: Self-pay | Admitting: Cardiovascular Disease

## 2020-10-07 ENCOUNTER — Telehealth: Payer: Medicare Other | Admitting: Emergency Medicine

## 2020-10-07 DIAGNOSIS — J069 Acute upper respiratory infection, unspecified: Secondary | ICD-10-CM | POA: Diagnosis not present

## 2020-10-07 MED ORDER — BENZONATATE 100 MG PO CAPS: 100.0000 mg | ORAL_CAPSULE | Freq: Three times a day (TID) | ORAL | 0 refills | Status: DC | PRN

## 2020-10-07 MED ORDER — DOXYCYCLINE HYCLATE 100 MG PO CAPS: 100.0000 mg | ORAL_CAPSULE | Freq: Two times a day (BID) | ORAL | 0 refills | Status: DC

## 2020-10-07 MED ORDER — AMOXICILLIN-POT CLAVULANATE 875-125 MG PO TABS: 1.0000 | ORAL_TABLET | Freq: Two times a day (BID) | ORAL | 0 refills | Status: DC

## 2020-10-07 NOTE — Addendum Note (Signed)
Addended by: Noe Gens on: 10/07/2020 09:29 AM   Modules accepted: Orders

## 2020-10-07 NOTE — Progress Notes (Signed)
We are sorry that you are not feeling well.  Here is how we plan to help!  Based on your presentation I believe you most likely have A cough due to bacteria.  When patients have a fever and a productive cough with a change in color or increased sputum production, we are concerned about bacterial bronchitis.  If left untreated it can progress to pneumonia.  If your symptoms do not improve with your treatment plan it is important that you contact your provider.   I have prescribed Doxycycline 100 mg twice a day for 7 days     In addition you may use A prescription cough medication called Tessalon Perles 100mg. You may take 1-2 capsules every 8 hours as needed for your cough.  From your responses in the eVisit questionnaire you describe inflammation in the upper respiratory tract which is causing a significant cough.  This is commonly called Bronchitis and has four common causes:    Allergies  Viral Infections  Acid Reflux  Bacterial Infection Allergies, viruses and acid reflux are treated by controlling symptoms or eliminating the cause. An example might be a cough caused by taking certain blood pressure medications. You stop the cough by changing the medication. Another example might be a cough caused by acid reflux. Controlling the reflux helps control the cough.  USE OF BRONCHODILATOR ("RESCUE") INHALERS: There is a risk from using your bronchodilator too frequently.  The risk is that over-reliance on a medication which only relaxes the muscles surrounding the breathing tubes can reduce the effectiveness of medications prescribed to reduce swelling and congestion of the tubes themselves.  Although you feel brief relief from the bronchodilator inhaler, your asthma may actually be worsening with the tubes becoming more swollen and filled with mucus.  This can delay other crucial treatments, such as oral steroid medications. If you need to use a bronchodilator inhaler daily, several times per day,  you should discuss this with your provider.  There are probably better treatments that could be used to keep your asthma under control.     HOME CARE . Only take medications as instructed by your medical team. . Complete the entire course of an antibiotic. . Drink plenty of fluids and get plenty of rest. . Avoid close contacts especially the very young and the elderly . Cover your mouth if you cough or cough into your sleeve. . Always remember to wash your hands . A steam or ultrasonic humidifier can help congestion.   GET HELP RIGHT AWAY IF: . You develop worsening fever. . You become short of breath . You cough up blood. . Your symptoms persist after you have completed your treatment plan MAKE SURE YOU   Understand these instructions.  Will watch your condition.  Will get help right away if you are not doing well or get worse.  Your e-visit answers were reviewed by a board certified advanced clinical practitioner to complete your personal care plan.  Depending on the condition, your plan could have included both over the counter or prescription medications. If there is a problem please reply  once you have received a response from your provider. Your safety is important to us.  If you have drug allergies check your prescription carefully.    You can use MyChart to ask questions about today's visit, request a non-urgent call back, or ask for a work or school excuse for 24 hours related to this e-Visit. If it has been greater than 24 hours you will   need to follow up with your provider, or enter a new e-Visit to address those concerns. You will get an e-mail in the next two days asking about your experience.  I hope that your e-visit has been valuable and will speed your recovery. Thank you for using e-visits.  Approximately 5 minutes was spent documenting and reviewing patient's chart.  

## 2020-10-08 NOTE — Telephone Encounter (Signed)
Virtual ok

## 2020-10-08 NOTE — Telephone Encounter (Signed)
    Appointment Request From: [name]   With Provider: Kathlyn Sacramento, MD [place]   Preferred Date Range: 10/06/2020 - 11/27/2020   Preferred Times: Any Time   Reason for visit: Office Visit   Comments: Doing well just a follow up  due with Dr Fletcher Anon would prefer virtual if okay this time [phone] cell contact

## 2020-10-08 NOTE — Telephone Encounter (Signed)
Attempted to schedule.  LMOV to call office.  ° °

## 2020-10-08 NOTE — Telephone Encounter (Signed)
The patients last visit was virtual. Dr. Fletcher Anon did  rqst that her next appt be in person. Her last EKG was > 1 year. In office appt is preferred a virtual would be ok if that is not possible.

## 2020-10-08 NOTE — Telephone Encounter (Signed)
Please contact pt for future appointment. Pt overdue for 6 month f/u.  

## 2020-10-10 ENCOUNTER — Telehealth: Payer: Self-pay

## 2020-10-10 ENCOUNTER — Other Ambulatory Visit: Payer: Self-pay

## 2020-10-10 ENCOUNTER — Telehealth (INDEPENDENT_AMBULATORY_CARE_PROVIDER_SITE_OTHER): Payer: Medicare Other | Admitting: Family Medicine

## 2020-10-10 ENCOUNTER — Encounter: Payer: Self-pay | Admitting: Family Medicine

## 2020-10-10 VITALS — BP 154/82 | Temp 98.7°F | Ht 66.0 in

## 2020-10-10 DIAGNOSIS — C50412 Malignant neoplasm of upper-outer quadrant of left female breast: Secondary | ICD-10-CM | POA: Diagnosis not present

## 2020-10-10 DIAGNOSIS — E039 Hypothyroidism, unspecified: Secondary | ICD-10-CM | POA: Diagnosis not present

## 2020-10-10 DIAGNOSIS — M85851 Other specified disorders of bone density and structure, right thigh: Secondary | ICD-10-CM

## 2020-10-10 DIAGNOSIS — Z1211 Encounter for screening for malignant neoplasm of colon: Secondary | ICD-10-CM | POA: Diagnosis not present

## 2020-10-10 DIAGNOSIS — Z794 Long term (current) use of insulin: Secondary | ICD-10-CM

## 2020-10-10 DIAGNOSIS — E1169 Type 2 diabetes mellitus with other specified complication: Secondary | ICD-10-CM | POA: Diagnosis not present

## 2020-10-10 DIAGNOSIS — N3941 Urge incontinence: Secondary | ICD-10-CM | POA: Diagnosis not present

## 2020-10-10 DIAGNOSIS — E785 Hyperlipidemia, unspecified: Secondary | ICD-10-CM

## 2020-10-10 DIAGNOSIS — E118 Type 2 diabetes mellitus with unspecified complications: Secondary | ICD-10-CM | POA: Diagnosis not present

## 2020-10-10 DIAGNOSIS — Z853 Personal history of malignant neoplasm of breast: Secondary | ICD-10-CM

## 2020-10-10 DIAGNOSIS — Z789 Other specified health status: Secondary | ICD-10-CM

## 2020-10-10 DIAGNOSIS — I10 Essential (primary) hypertension: Secondary | ICD-10-CM | POA: Diagnosis not present

## 2020-10-10 DIAGNOSIS — E559 Vitamin D deficiency, unspecified: Secondary | ICD-10-CM | POA: Diagnosis not present

## 2020-10-10 DIAGNOSIS — G4700 Insomnia, unspecified: Secondary | ICD-10-CM

## 2020-10-10 DIAGNOSIS — I69354 Hemiplegia and hemiparesis following cerebral infarction affecting left non-dominant side: Secondary | ICD-10-CM

## 2020-10-10 DIAGNOSIS — Z170 Estrogen receptor positive status [ER+]: Secondary | ICD-10-CM

## 2020-10-10 DIAGNOSIS — E1122 Type 2 diabetes mellitus with diabetic chronic kidney disease: Secondary | ICD-10-CM | POA: Diagnosis not present

## 2020-10-10 DIAGNOSIS — Z9012 Acquired absence of left breast and nipple: Secondary | ICD-10-CM

## 2020-10-10 DIAGNOSIS — N183 Chronic kidney disease, stage 3 unspecified: Secondary | ICD-10-CM

## 2020-10-10 DIAGNOSIS — R32 Unspecified urinary incontinence: Secondary | ICD-10-CM | POA: Insufficient documentation

## 2020-10-10 DIAGNOSIS — F330 Major depressive disorder, recurrent, mild: Secondary | ICD-10-CM

## 2020-10-10 DIAGNOSIS — Z8673 Personal history of transient ischemic attack (TIA), and cerebral infarction without residual deficits: Secondary | ICD-10-CM

## 2020-10-10 DIAGNOSIS — N2581 Secondary hyperparathyroidism of renal origin: Secondary | ICD-10-CM | POA: Diagnosis not present

## 2020-10-10 DIAGNOSIS — K219 Gastro-esophageal reflux disease without esophagitis: Secondary | ICD-10-CM | POA: Diagnosis not present

## 2020-10-10 DIAGNOSIS — R928 Other abnormal and inconclusive findings on diagnostic imaging of breast: Secondary | ICD-10-CM

## 2020-10-10 MED ORDER — LEVOTHYROXINE SODIUM 50 MCG PO TABS: 50.0000 ug | ORAL_TABLET | Freq: Every day | ORAL | 3 refills | Status: AC

## 2020-10-10 MED ORDER — VITAMIN D3 1.25 MG (50000 UT) PO TABS: 1.0000 | ORAL_TABLET | ORAL | 3 refills | Status: AC

## 2020-10-10 NOTE — Telephone Encounter (Signed)
Lvm asking pt to call back.  Need to get her set up for MyChart visit today at 3:30.

## 2020-10-10 NOTE — Progress Notes (Signed)
Patient ID: Julia Banks, female    DOB: 07/03/1940, 80 y.o.   MRN: [id number]  Virtual visit completed through [place], a video enabled telemedicine application. Due to national recommendations of social distancing due to COVID-19, a virtual visit is felt to be most appropriate for this patient at this time. Reviewed limitations, risks, security and privacy concerns of performing a virtual visit and the availability of in person appointments. I also reviewed that there may be a patient responsible charge related to this service. The patient agreed to proceed.   Patient location: home (lives with Julia Lull niece since 03/2019)  Provider location: Velora Heckler at [facility], office Persons participating in this virtual visit: patient, provider, pt's nephew in law Julia Banks)  If any vitals were documented, they were collected by patient at home unless specified below.    BP (!) 154/82   Temp 98.7 F (37.1 C)   Ht 5\' 6"  (1.676 m)   BMI 30.34 kg/m    CC: AMW Julia Banks (Julia Banks) [phone] Subjective:   HPI: Julia Banks is a 80 y.o. female presenting on 10/10/2020 for Medicare Wellness   Has not yet spoke with health advisor this year. AMW was cancelled and rescheduled to May 11th, 2022.   Recent E visit 10/07/2020 for URI treated with augmentin course - continues this.  Lives with niece/nephew.   They note ongoing memory issues.  They are using Stedy lift to transfer off/on commode as well as out of bed.  Notes sleeping well on seroquel.   Continue seeing neurology and cardiology - due for f/u with both, has neuro f/u planned.   No exam data present.  [place] Office Visit from 05/30/2019 in [place] at [place]  PHQ-2 Total Score 0      Fall Risk  07/04/2019 05/30/2019 04/27/2019 04/09/2018 01/26/2017  Falls in the past year? 0 0 0 No No  Number falls in past yr: - - - - -  Comment - - - - -  Injury with Fall? - - - - -  Comment - - - - -     Preventative: COLONOSCOPY 7/20161 TA, rpt 5 yrs Julia Banks) discussed options - would like iFOB.  Well woman - s/p hysterectomy for benign reason.Last well woman exam with Julia. Josie Banks, seen yearly.  Breast cancer - s/pL mastectomy2015 - released from careofDr Banks. Unable to tolerate anastrazole, has not seen Julia Banks. R mammo 05/2019 abnormal - never returned for f/u.  [id number]- through Julia Banks office.Osteopenia-0.5 spine,-1.7 R femoral neck Flu shot - declines COVID vaccine - declined Tdap 2007 prevnar 2006, pneumovax 2014 Shingrix - discussed, declines  Advanced directives: has this at home. HC proxy would be niece Julia Banks. Asked to bring me copy.  Seat belt use discussed  Sunscreen use discussed. No changing moles on skin.  Non smoker Alcohol - none Dentist Q6 mo - DUE Eye exam yearly - DUE Bowel - constipation - managed with metamucil but not regularly using. No bowel incontinence.  Bladder - new urge incontinence. Using Depends adult diapers 1/day - requests Rx. Denies dysuria, new urgency, no blood in urine.   Widow, Banks Julia Banks passed 2015. Has 3 dogs.  No children. Lives with niece Julia Lull  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, nutritional  Edu: 1 yr college  Activity: stationary bicycle, some walking, yardwork Diet: limited water intake, good fruits/vegetables daily     Relevant past medical, surgical, family and social history reviewed and  updated as indicated. Interim medical history since our last visit reviewed. Allergies and medications reviewed and updated. Outpatient Medications Prior to Visit  Medication Sig Dispense Refill  . clopidogrel (PLAVIX) 75 MG tablet Take 1 tablet (75 mg total) by mouth daily.    Marland Kitchen acetaminophen (TYLENOL) 325 MG tablet Take 2 tablets (650 mg total) by mouth 3 (three) times daily as needed for mild pain (or temp > 37.5 C (99.5 F)). 30 tablet 0  . ADVAIR DISKUS  250-50 MCG/DOSE AEPB TAKE 1 PUFF BY MOUTH TWICE A DAY 60 each 2  . albuterol (VENTOLIN HFA) 108 (90 Base) MCG/ACT inhaler TAKE 2 PUFFS BY MOUTH EVERY 6 HOURS AS NEEDED FOR WHEEZE OR SHORTNESS OF BREATH 8.5 each 3  . amLODipine (NORVASC) 10 MG tablet TAKE 1 TABLET BY MOUTH EVERY DAY 90 tablet 3  . amoxicillin-clavulanate (AUGMENTIN) 875-125 MG tablet Take 1 tablet by mouth 2 (two) times daily. One po bid x 7 days 14 tablet 0  . Ascorbic Acid (VITAMIN C WITH ROSE HIPS) 500 MG tablet Take 500 mg by mouth daily.    Marland Kitchen aspirin EC 81 MG EC tablet Take 1 tablet (81 mg total) by mouth daily. 30 tablet 2  . baclofen (LIORESAL) 10 MG tablet Take 1 tablet (10 mg total) by mouth at bedtime. 30 each 3  . benzonatate (TESSALON) 100 MG capsule Take 1-2 capsules (100-200 mg total) by mouth 3 (three) times daily as needed for cough. 20 capsule 0  . carvedilol (COREG) 6.25 MG tablet TAKE 1 TABLET BY MOUTH TWICE A DAY 60 tablet 0  . famotidine (PEPCID) 20 MG tablet TAKE 1 TABLET BY MOUTH EVERYDAY AT BEDTIME 90 tablet 1  . insulin glargine (LANTUS) 100 UNIT/ML injection Inject 0.42 mLs (42 Units total) into the skin daily. 40 mL 3  . Insulin Pen Needle (BD PEN NEEDLE NANO U/F) 32G X 4 MM MISC USE 1 AS DIRECTED TO INJECT MEDICATION DAILY. 100 each 3  . Insulin Syringe-Needle U-100 31G X 15/64" 1 ML MISC Use as instructed to administer insulin daily. 100 each 3  . QUEtiapine (SEROQUEL) 50 MG tablet Take 1 tablet (50 mg total) by mouth in the morning AND 1.5 tablets (75 mg total) at bedtime. 225 tablet 3  . REPATHA SURECLICK 607 MG/ML SOAJ INJECT 1 PEN INTO THE SKIN EVERY 14 DAYS. 2 mL 4  . sertraline (ZOLOFT) 100 MG tablet Take 1 tablet (100 mg total) by mouth at bedtime. 90 tablet 3  . hydrochlorothiazide (MICROZIDE) 12.5 MG capsule Take 1 capsule (12.5 mg total) by mouth daily. 90 capsule 3  . levothyroxine (SYNTHROID) 25 MCG tablet Take one tablet daily, take 2 tablets on Monday, wednesday, friday 120 tablet 3  .  losartan (COZAAR) 100 MG tablet Take 1 tablet (100 mg total) by mouth daily. 90 tablet 3  . Omega-3 Fatty Acids (OMEGA 3 PO) Take 1 capsule by mouth daily. 180 EPA/120DHA      No facility-administered medications prior to visit.     Per HPI unless specifically indicated in ROS section below Review of Systems Objective:  BP (!) 154/82   Temp 98.7 F (37.1 C)   Ht 5\' 6"  (1.676 m)   BMI 30.34 kg/m   Wt Readings from Last 3 Encounters:  09/26/19 188 lb (85.3 kg)  08/16/19 188 lb (85.3 kg)  07/04/19 180 lb (81.6 kg)       Physical exam: Gen: alert, NAD, not ill appearing Pulm: speaks in complete sentences without  increased work of breathing Psych: normal mood, normal thought content      Results for orders placed or performed in visit on 10/04/20  CBC with Differential/Platelet  Result Value Ref Range   WBC 7.9 4.0 - 10.5 K/uL   RBC 4.65 3.87 - 5.11 Mil/uL   Hemoglobin 13.3 12.0 - 15.0 g/dL   HCT 39.0 36.0 - 46.0 %   MCV 83.8 78.0 - 100.0 fl   MCHC 34.1 30.0 - 36.0 g/dL   RDW 15.2 11.5 - 15.5 %   Platelets 187.0 150.0 - 400.0 K/uL   Neutrophils Relative % 61.4 43.0 - 77.0 %   Lymphocytes Relative 26.8 12.0 - 46.0 %   Monocytes Relative 5.0 3.0 - 12.0 %   Eosinophils Relative 6.2 (H) 0.0 - 5.0 %   Basophils Relative 0.6 0.0 - 3.0 %   Neutro Abs 4.9 1.4 - 7.7 K/uL   Lymphs Abs 2.1 0.7 - 4.0 K/uL   Monocytes Absolute 0.4 0.1 - 1.0 K/uL   Eosinophils Absolute 0.5 0.0 - 0.7 K/uL   Basophils Absolute 0.0 0.0 - 0.1 K/uL  VITAMIN D 25 Hydroxy (Vit-D Deficiency, Fractures)  Result Value Ref Range   VITD 15.08 (L) 30.00 - 100.00 ng/mL  Parathyroid hormone, intact (no Ca)  Result Value Ref Range   PTH 41 16 - 77 pg/mL  TSH  Result Value Ref Range   TSH 5.09 (H) 0.35 - 4.50 uIU/mL  Hemoglobin A1c  Result Value Ref Range   Hgb A1c MFr Bld 7.2 (H) 4.6 - 6.5 %  Comprehensive metabolic panel  Result Value Ref Range   Sodium 139 135 - 145 mEq/L   Potassium 3.9 3.5 - 5.1  mEq/L   Chloride 103 96 - 112 mEq/L   CO2 28 19 - 32 mEq/L   Glucose, Bld 152 (H) 70 - 99 mg/dL   BUN 17 6 - 23 mg/dL   Creatinine, Ser 1.20 0.40 - 1.20 mg/dL   Total Bilirubin 0.4 0.2 - 1.2 mg/dL   Alkaline Phosphatase 85 39 - 117 U/L   AST 12 0 - 37 U/L   ALT 9 0 - 35 U/L   Total Protein 6.8 6.0 - 8.3 g/dL   Albumin 3.9 3.5 - 5.2 g/dL   GFR 43.63 (L) >60.00 mL/min   Calcium 9.5 8.4 - 10.5 mg/dL  Lipid panel  Result Value Ref Range   Cholesterol 323 (H) 0 - 200 mg/dL   Triglycerides 383.0 (H) 0.0 - 149.0 mg/dL   HDL 39.50 >39.00 mg/dL   VLDL 76.6 (H) 0.0 - 40.0 mg/dL   Total CHOL/HDL Ratio 8    NonHDL 283.61   Hepatitis C antibody  Result Value Ref Range   Hepatitis C Ab NON-REACTIVE NON-REACTI   SIGNAL TO CUT-OFF 0.01 <1.00  LDL cholesterol, direct  Result Value Ref Range   Direct LDL 185.0 mg/dL   Assessment & Plan:  rec keep appt with health advisor for next month.  Overdue for colon cancer screen - will mail iFOB.   Problem List Items Addressed This Visit    Controlled diabetes mellitus type 2 with complications ([place]) - Primary    Stable period on lantus as evidenced by A1c <8%.       Relevant Medications   losartan (COZAAR) 100 MG tablet   Essential hypertension, benign    BP elevated today - continue amlodipine, carvedilol, hctz and losartan.       Relevant Medications   hydrochlorothiazide (MICROZIDE) 12.5 MG capsule  losartan (COZAAR) 100 MG tablet   Hyperlipidemia associated with type 2 diabetes mellitus (HCC)    Marked deterioration with LDL up to 185 and triglycerides 383 - they have not been taking Repatha. rec restart injections.  The ASCVD Risk score Mikey Bussing DC Jr., et al., 2013) failed to calculate for the following reasons:   The patient has a prior MI or stroke diagnosis       Relevant Medications   losartan (COZAAR) 100 MG tablet   GERD (gastroesophageal reflux disease)    No longer on PPI - will monitor off therapy.       Hypothyroidism     TSH remains high - will increase levothyroxine dose to 71mcg daily.       Relevant Medications   levothyroxine (SYNTHROID) 50 MCG tablet   Diabetes mellitus with stage 3 chronic kidney disease (HCC)    Stable period.       Relevant Medications   losartan (COZAAR) 100 MG tablet   Breast cancer of upper-outer quadrant of left female breast ([place])    S/p L mastectomy 2015 Lost to f/u - will order R breast screening mammogram and advised to call and schedule.       S/P mastectomy   Relevant Orders   MM DIAG BREAST TOMO UNI RIGHT   Osteopenia    Through OBGYN - but has not recently seen. Consider updating.       Insomnia    She continues seroquel with benefit.      History of cerebrovascular accident (CVA) due to ischemia    Continues aspirin, plavix, rec restart repatha.       Hemiparesis affecting left side as late effect of cerebrovascular accident (CVA) ([place])    Ongoing difficulty - they use Stedy lift at home to assist with transfers.       MDD (major depressive disorder), recurrent episode, mild (HCC)    Stable period on sertraline 100mg  daily.       Urine incontinence    New urge incontinence - I asked them to bring urine sample for UA.  Written Rx depends to fill at local medical supply store.       Statin intolerance    Intolerant to statins in the past (crestor, lipitor, pravastatin, lovastatin). Needs to restart Repatha      Vitamin D deficiency    Will start weekly vit D replacement x12 months then reassess.       RESOLVED: Secondary hyperparathyroidism of renal origin [facility])    This has normalized - will resolve.        Other Visit Diagnoses    Special screening for malignant neoplasms, colon       Relevant Orders   Fecal occult blood, imunochemical   History of breast cancer       Relevant Orders   MM DIAG BREAST TOMO UNI RIGHT       Meds ordered this encounter  Medications  . Cholecalciferol (VITAMIN D3) 1.25 MG (50000 UT) TABS    Sig:  Take 1 tablet by mouth once a week.    Dispense:  12 tablet    Refill:  3  . levothyroxine (SYNTHROID) 50 MCG tablet    Sig: Take 1 tablet (50 mcg total) by mouth daily before breakfast.    Dispense:  90 tablet    Refill:  3    Note new sig  . hydrochlorothiazide (MICROZIDE) 12.5 MG capsule    Sig: Take 1 capsule (12.5 mg total) by mouth daily.  Dispense:  90 capsule    Refill:  3  . losartan (COZAAR) 100 MG tablet    Sig: Take 1 tablet (100 mg total) by mouth daily.    Dispense:  90 tablet    Refill:  3   Orders Placed This Encounter  Procedures  . Fecal occult blood, imunochemical    Standing Status:   Future    Standing Expiration Date:   10/10/2021  . MM DIAG BREAST TOMO UNI RIGHT    Standing Status:   Future    Standing Expiration Date:   10/10/2021    Scheduling Instructions:     [facility]    Order Specific Question:   Reason for Exam (SYMPTOM  OR DIAGNOSIS REQUIRED)    Answer:   h/o abnormal mammogram R breast 05/2019 - lost to f/u    Order Specific Question:   Preferred imaging location?    Answer:   External    I discussed the assessment and treatment plan with the patient. The patient was provided an opportunity to ask questions and all were answered. The patient agreed with the plan and demonstrated an understanding of the instructions. The patient was advised to call back or seek an in-person evaluation if the symptoms worsen or if the condition fails to improve as anticipated.  Follow up plan: No follow-ups on file.  Ria Bush, MD

## 2020-10-10 NOTE — Telephone Encounter (Signed)
Dr. Darnell Level went ahead and started visit with pt.

## 2020-10-10 NOTE — Assessment & Plan Note (Addendum)
New urge incontinence - I asked them to bring urine sample for UA.  Written Rx depends to fill at local medical supply store.

## 2020-10-11 ENCOUNTER — Other Ambulatory Visit: Payer: Medicare Other

## 2020-10-11 ENCOUNTER — Telehealth: Payer: Self-pay

## 2020-10-11 ENCOUNTER — Telehealth: Payer: Self-pay | Admitting: Family Medicine

## 2020-10-11 NOTE — Telephone Encounter (Signed)
Lvm asking pt's niece, [name] (on dpr), to call back.  Dr. Darnell Level has written rx for Depends diapers.  Need to know if they will pick up rx or if preferred we mail it.  [Rx in basket on Lisa's desk pending call back from family.]

## 2020-10-11 NOTE — Telephone Encounter (Signed)
Since she's not having typical UTI symptoms I don't think she needs UCC eval unless symptoms worsen - could wait until next week and try to bring in urine sample at that time. This was for workup of urine incontinence.

## 2020-10-11 NOTE — Telephone Encounter (Signed)
Her niece husband called and stated that they can not come pick up the urine kit due to he has to work, and they will take her to an urgent care.

## 2020-10-11 NOTE — Telephone Encounter (Signed)
Lvm asking pt/pt's niece, [name] (on dpr), to call back.  Need to relay Dr. Synthia Innocent message.

## 2020-10-13 DIAGNOSIS — Z789 Other specified health status: Secondary | ICD-10-CM | POA: Insufficient documentation

## 2020-10-13 DIAGNOSIS — E559 Vitamin D deficiency, unspecified: Secondary | ICD-10-CM | POA: Insufficient documentation

## 2020-10-13 IMAGING — MR MR HEAD W/O CM
12 of 14 series · 36 of 48 positions shown · non-contrast
Comparison: Head CT and CTA from earlier today

CLINICAL DATA: Left-sided weakness this morning

EXAM:
MRI HEAD WITHOUT CONTRAST
TECHNIQUE: Multiplanar, multiecho pulse sequences of the brain and surrounding
structures were obtained without intravenous contrast.

[Series 5: DWI · axial · 3.0mm · 0.88mm/px · z∈[-36,+110]mm · 6 of 100 slices shown (1 of 4)]
[im 1/100]
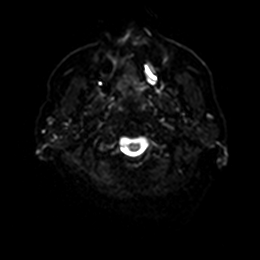
[im 20/100]
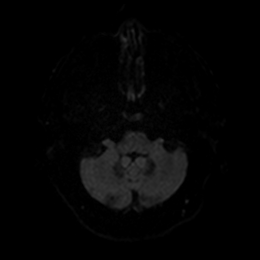
[im 40/100]
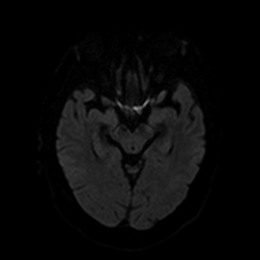
[im 60/100]
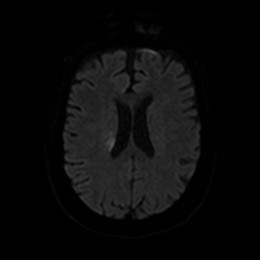
[im 80/100]
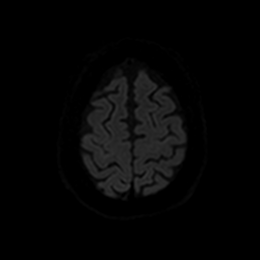
[im 100/100]
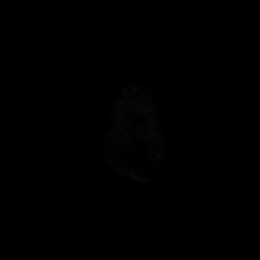

[Series 6: DWI · axial · 3.0mm · 0.88mm/px · z∈[-36,+110]mm · 3 of 50 slices shown (2 of 4)]
[im 1/50]
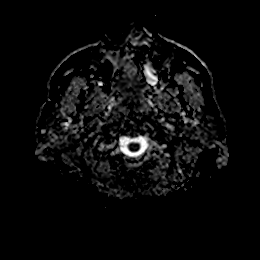
[im 25/50]
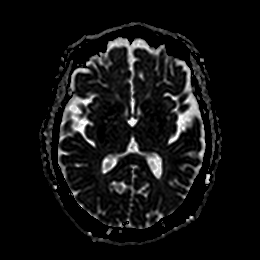
[im 50/50]
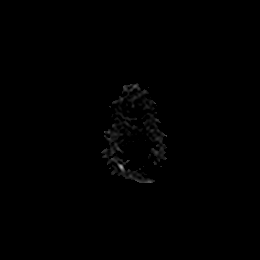

[Series 7: DWI · coronal · 4.0mm · 0.88mm/px · 4 of 68 slices shown (3 of 4)]
[im 1/68]
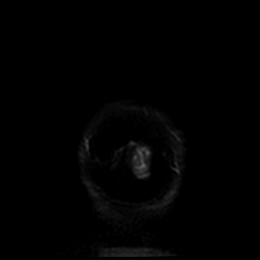
[im 23/68]
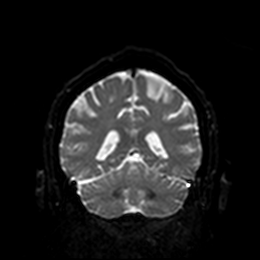
[im 45/68]
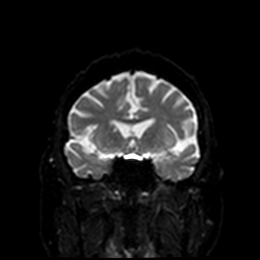
[im 68/68]
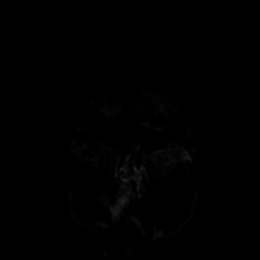

[Series 8: DWI · coronal · 4.0mm · 0.88mm/px · 2 of 34 slices shown (4 of 4)]
[im 1/34]
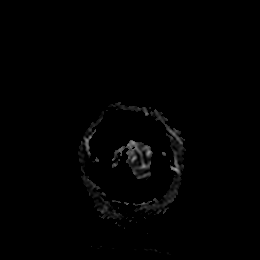
[im 34/34]
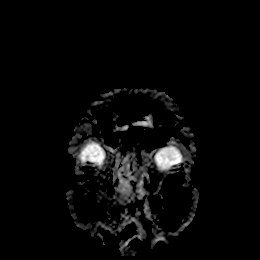

[Series 9: T1 · sagittal · 5.0mm · 0.75mm/px · 1 of 23 slices shown]
[im 1/23]
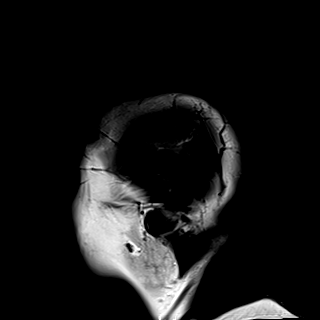

[Series 10: T2 · axial · 5.0mm · 0.72mm/px · z∈[-53,+109]mm · 2 of 28 slices shown (1 of 2)]
[im 1/28]
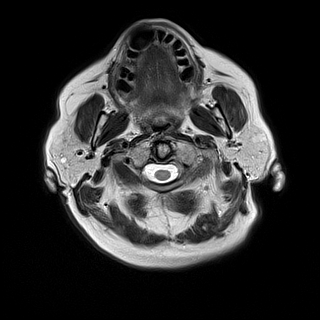
[im 28/28]
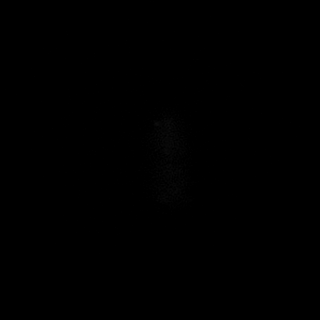

[Series 11: FLAIR · axial · 5.0mm · 0.45mm/px · z∈[-53,+109]mm · 2 of 28 slices shown]
[im 1/28]
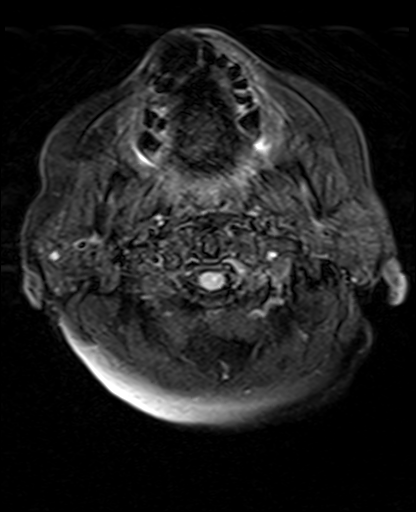
[im 28/28]
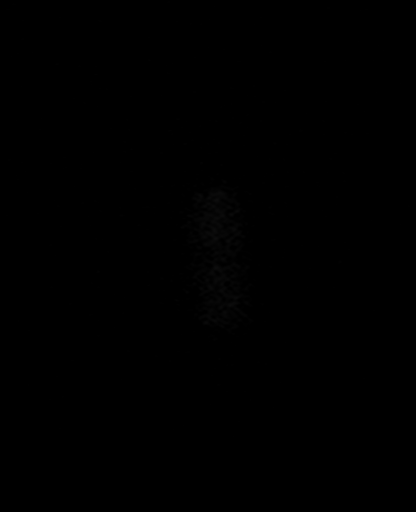

[Series 12: mag_images · axial · 3.0mm · 0.90mm/px · z∈[-60,+117]mm · 4 of 60 slices shown]
[im 1/60]
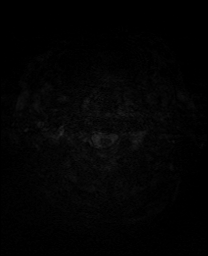
[im 20/60]
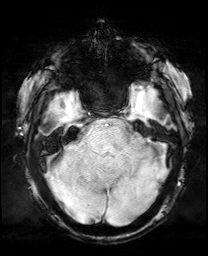
[im 40/60]
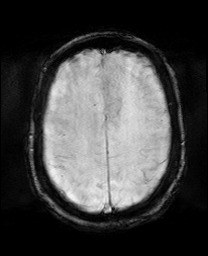
[im 60/60]
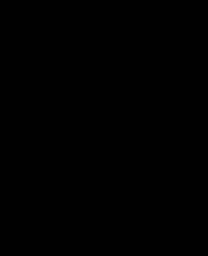

[Series 13: pha_images · axial · 3.0mm · 0.90mm/px · z∈[-57,+111]mm · 3 of 55 slices shown]
[im 1/55]
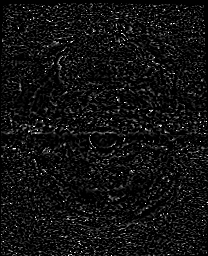
[im 28/55]
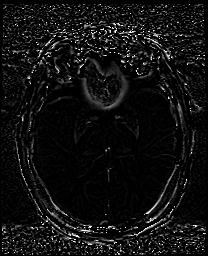
[im 55/55]
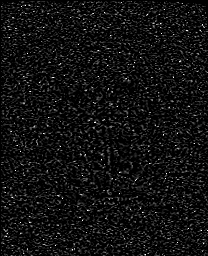

[Series 14: swi_images · axial · 3.0mm · 0.90mm/px · z∈[-60,+117]mm · 4 of 60 slices shown]
[im 1/60]
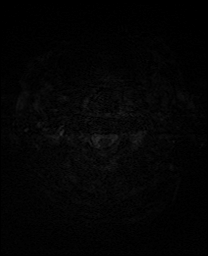
[im 20/60]
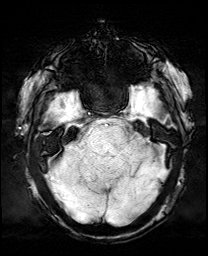
[im 40/60]
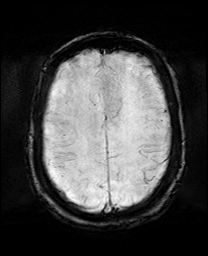
[im 60/60]
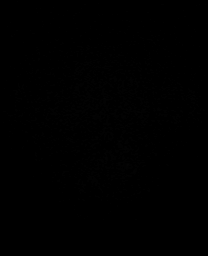

[Series 15: mip_images(sw) · axial · 24.0mm · 0.90mm/px · z∈[-50,+106]mm · 3 of 53 slices shown]
[im 1/53]
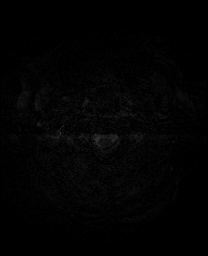
[im 27/53]
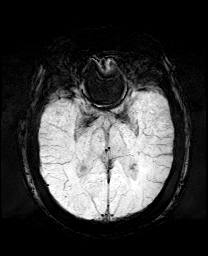
[im 53/53]
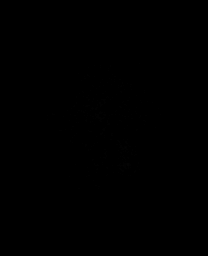

[Series 17: T2 · coronal · 5.0mm · 0.34mm/px · 2 of 29 slices shown (2 of 2)]
[im 1/29]
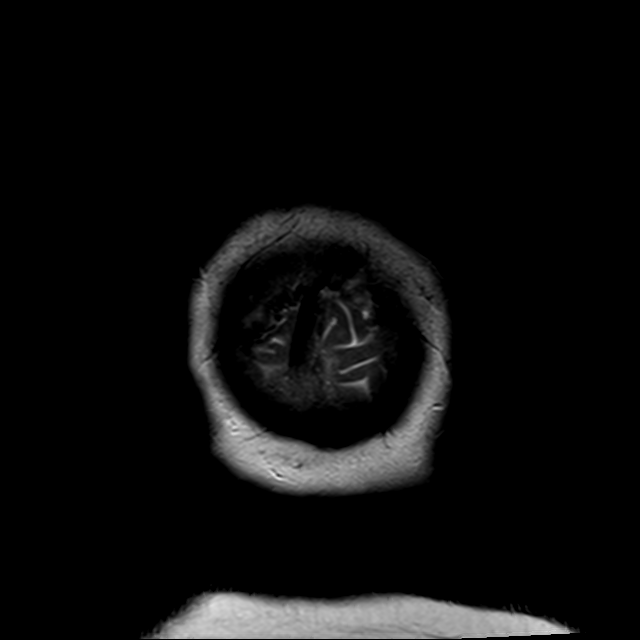
[im 29/29]
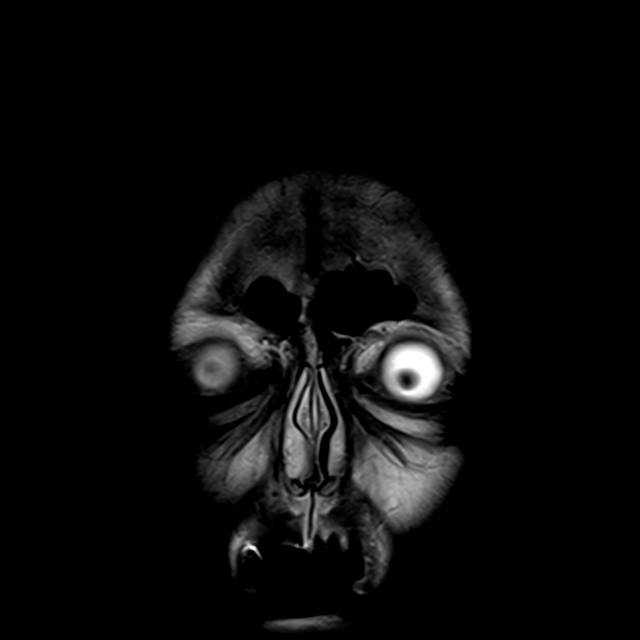

[36 of 48 positions shown; findings below may reference images not displayed]

FINDINGS: Brain: Acute perforator infarct on the right affecting corona
radiata, posterior caudate, and upper putamen. No pre-existing
infarct. No hemorrhage, hydrocephalus, collection, or masslike
finding

Vascular: No change from prior CTA.

Skull and upper cervical spine: Negative for marrow lesion.

Sinuses/Orbits: Negative
IMPRESSION: Acute perforator infarct at the right basal ganglia and corona
radiata.

## 2020-10-13 IMAGING — CT CT ANGIO HEAD
3 of 7 series · 10 of 36 positions shown · IV contrast (APPLIED)
Comparison: None.

CLINICAL DATA: Left-sided weakness

EXAM:
CT ANGIOGRAPHY HEAD AND NECK
TECHNIQUE: Multidetector CT imaging of the head and neck was performed using
the standard protocol during bolus administration of intravenous
contrast. Multiplanar CT image reconstructions and MIPs were
obtained to evaluate the vascular anatomy. Carotid stenosis
measurements (when applicable) are obtained utilizing NASCET
criteria, using the distal internal carotid diameter as the
denominator.
CONTRAST:  100mL OMNIPAQUE IOHEXOL 350 MG/ML SOLN

[Series 8: cta neck/head · axial · 0.43mm/px · z∈[-245,-129]mm · 2 of 176 slices shown]
[im 59/176  soft-tissue]
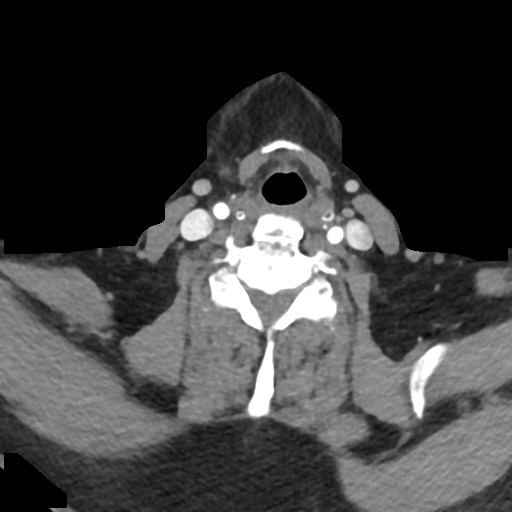
[im 117/176  soft-tissue]
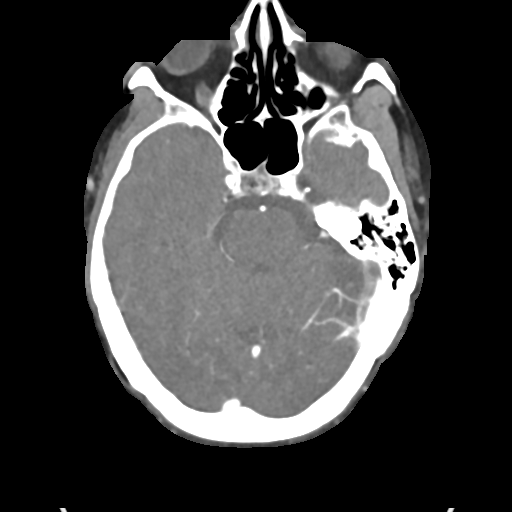

[Series 10: ax thins · axial · 0.39mm/px · z∈[-319,-69]mm · 6 of 353 slices shown]
[im 51/353  soft-tissue]
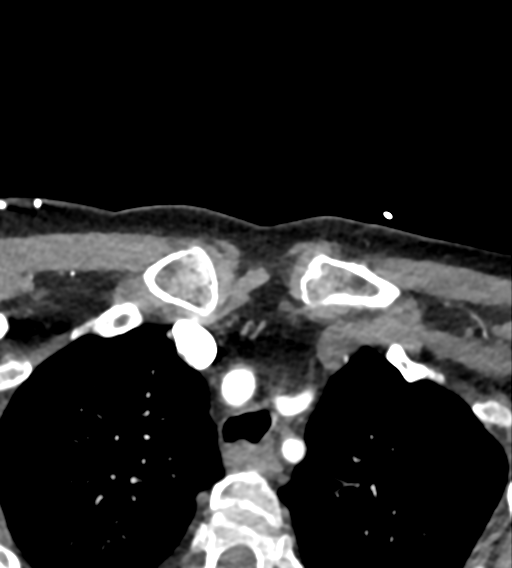
[im 101/353  bone]
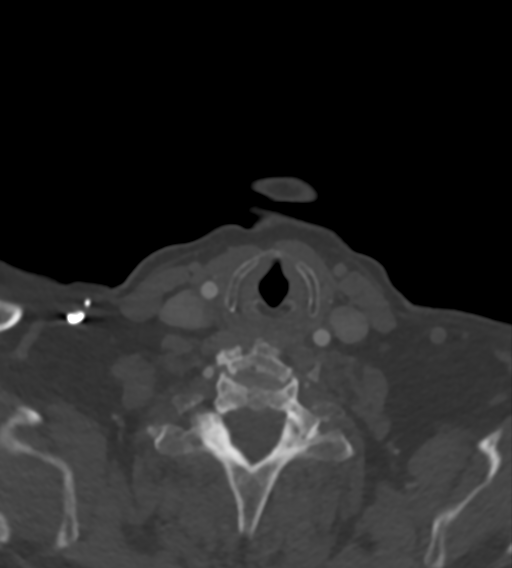
[im 151/353  soft-tissue]
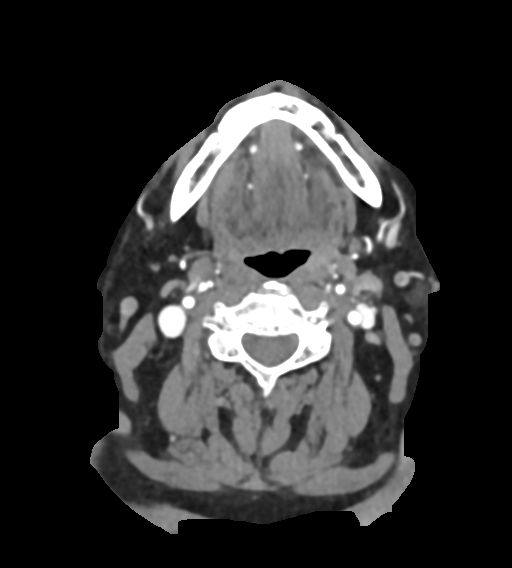
[im 202/353  bone]
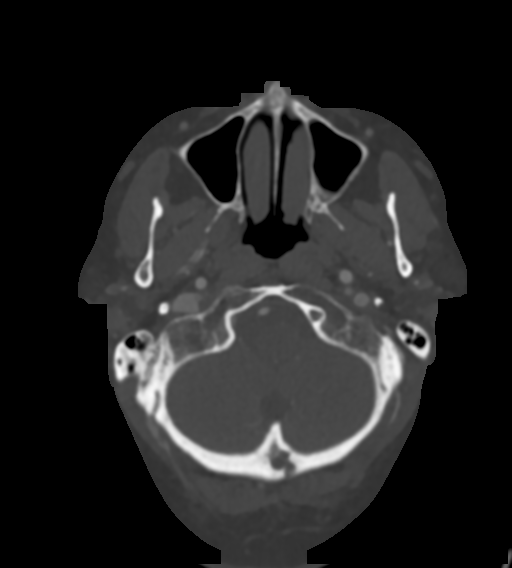
[im 252/353  soft-tissue]
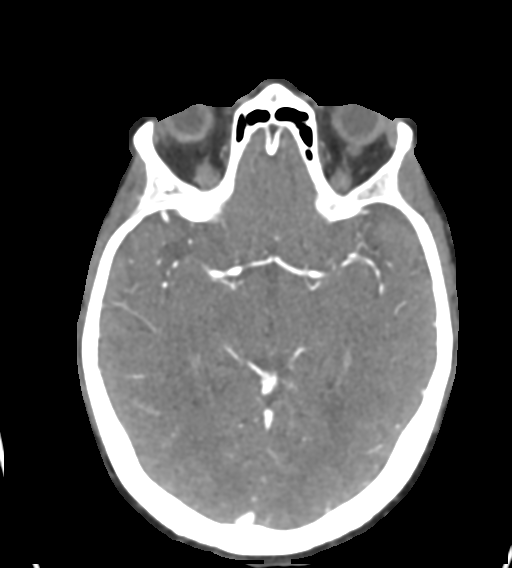
[im 302/353  bone]
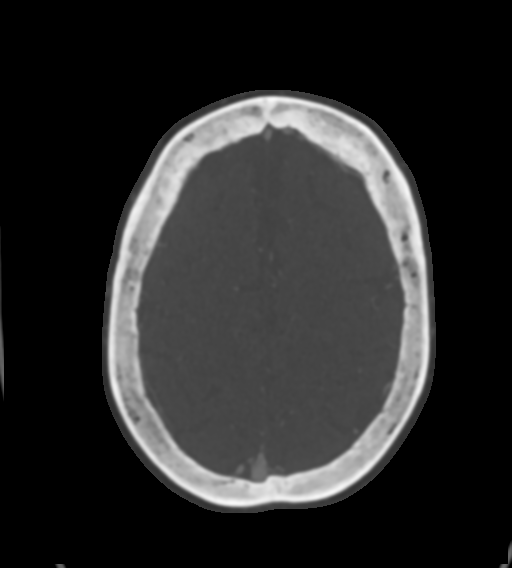

[Series 12: sag thins · sagittal · 0.46mm/px · 2 of 201 slices shown]
[im 32/201  soft-tissue]
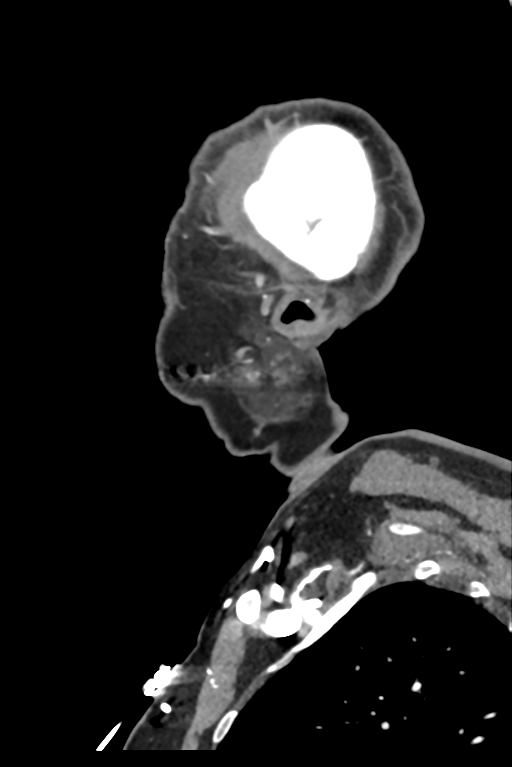
[im 170/201  soft-tissue]
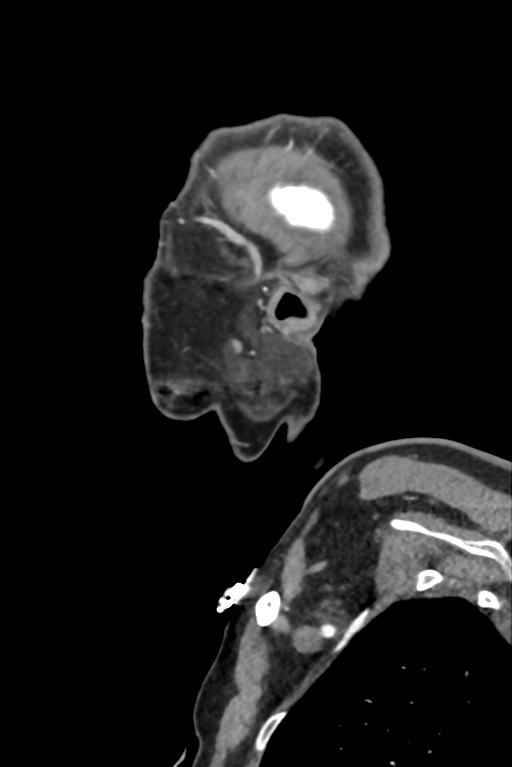

[10 of 36 positions shown; findings below may reference images not displayed]

FINDINGS: CTA NECK FINDINGS

Aortic arch: Mild atherosclerotic plaque.  Three vessel branching.

Right carotid system: Mixed density plaque at the ICA
bulb/bifurcation with 25% ICA origin narrowing. Negative for
ulceration or beading.

Left carotid system: Mild atherosclerotic plaque at the bifurcation.
No stenosis or ulceration.

Vertebral arteries: Proximal subclavian atherosclerosis without flow
limiting stenosis. Strong right vertebral artery dominance. No
acquired vertebral stenosis.

Skeleton: Spondylosis with bridging cervical and upper thoracic
osteophytes.

Other neck: Negative

Upper chest: Negative

Review of the MIP images confirms the above findings

CTA HEAD FINDINGS

Anterior circulation: Atherosclerotic calcification on the carotid
siphons. No branch occlusion or proximal flow limiting stenosis.
Negative for aneurysm.

Posterior circulation: Strong right vertebral artery dominance. Most
left vertebral flow is to the PICA. Basilar is diffusely patent and
small in the setting of bilateral fetal type PCA. No branch
occlusion. Negative for aneurysm

Venous sinuses: Diffusely patent

Anatomic variants: As above

Review of the MIP images confirms the above findings
IMPRESSION: 1. No emergent finding.
2. Atherosclerosis without flow limiting stenosis in the head and
neck.

## 2020-10-13 IMAGING — CT CT HEAD CODE STROKE
4 series · 17 of 47 positions shown, 19 images · non-contrast
Comparison: None.

CLINICAL DATA: Code stroke.  Left-sided weakness

EXAM:
CT HEAD WITHOUT CONTRAST
TECHNIQUE: Contiguous axial images were obtained from the base of the skull
through the vertex without intravenous contrast.

[Series 3: head wo · axial · 0.44mm/px · z∈[-155,-35]mm · 7 of 34 slices shown, 9 images]
[im 5/34  brain]
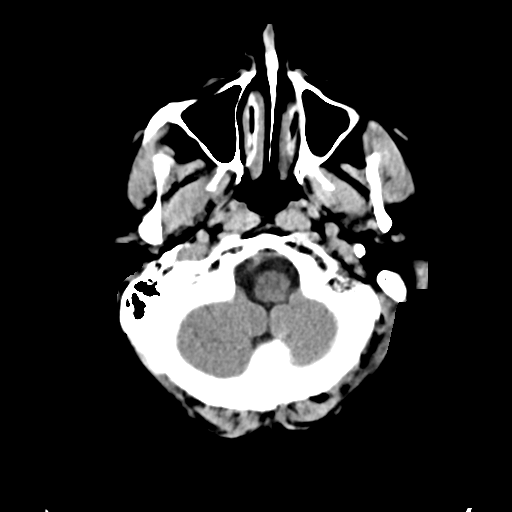
[im 5/34  bone]
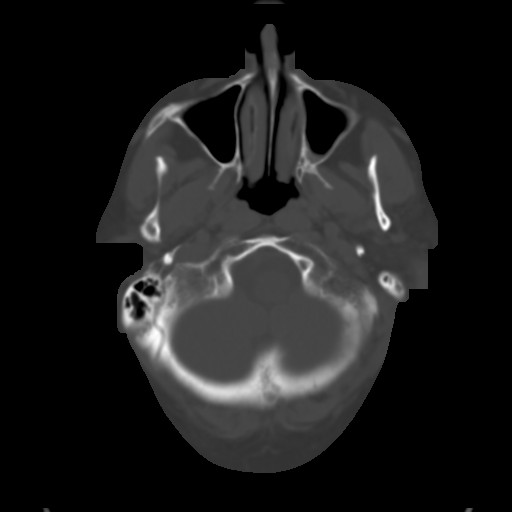
[im 9/34  brain]
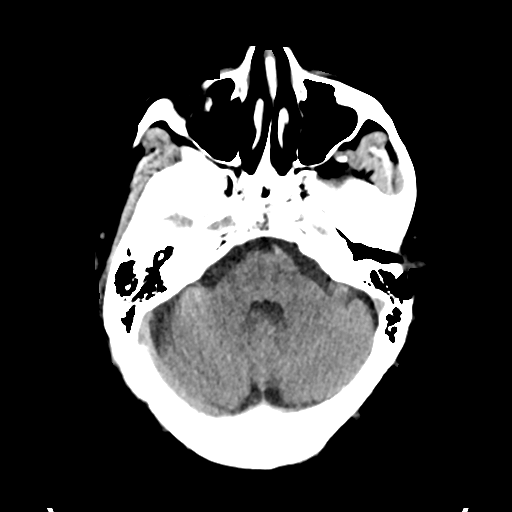
[im 13/34  brain]
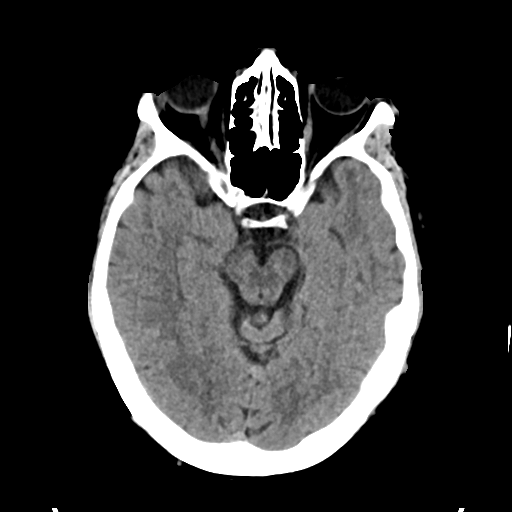
[im 17/34  brain]
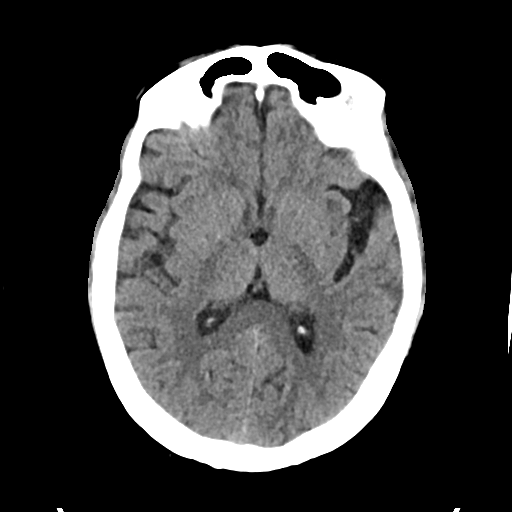
[im 21/34  brain]
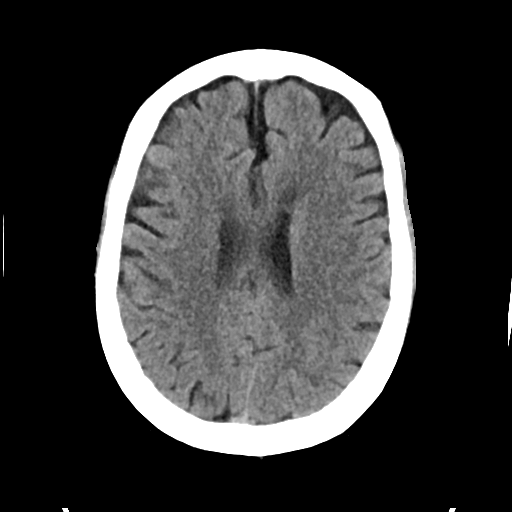
[im 21/34  bone]
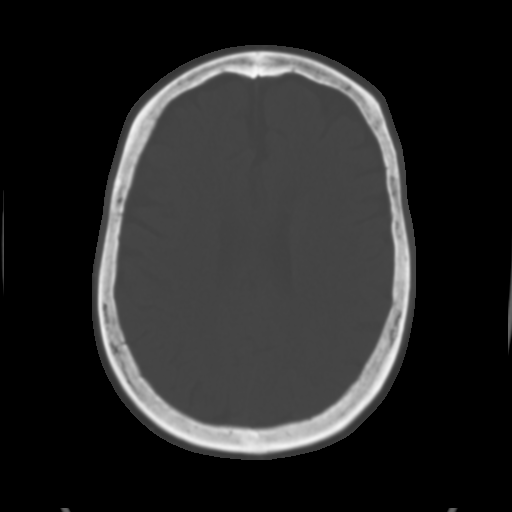
[im 25/34  brain]
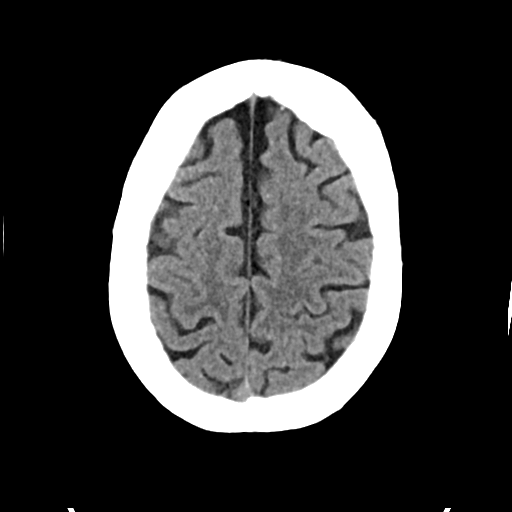
[im 29/34  brain]
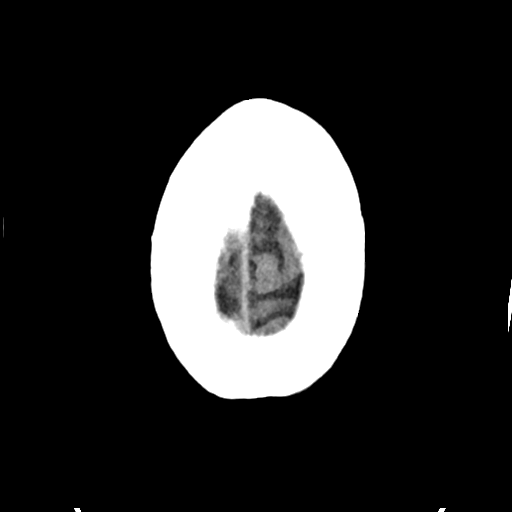

[Series 4: head bone · axial · 0.44mm/px · z∈[-159,-101]mm · 4 of 85 slices shown]
[im 9/85  bone]
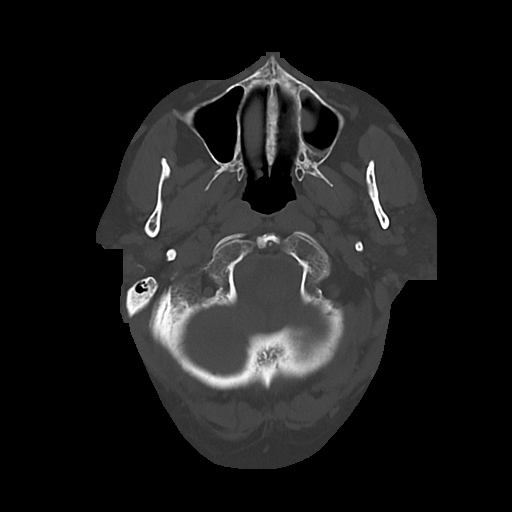
[im 17/85  bone]
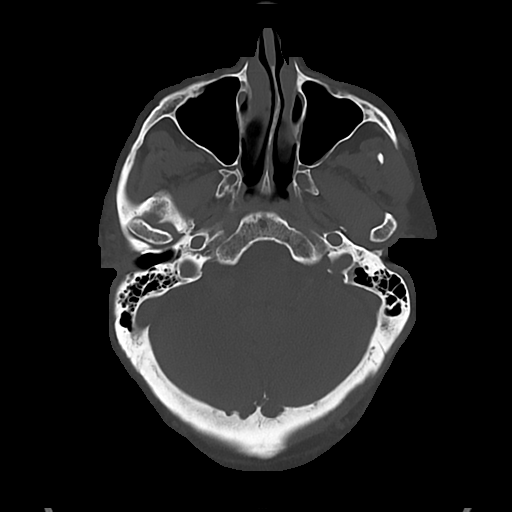
[im 26/85  bone]
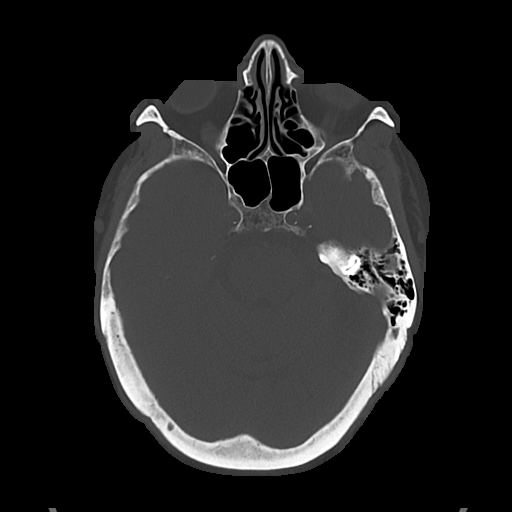
[im 38/85  bone]
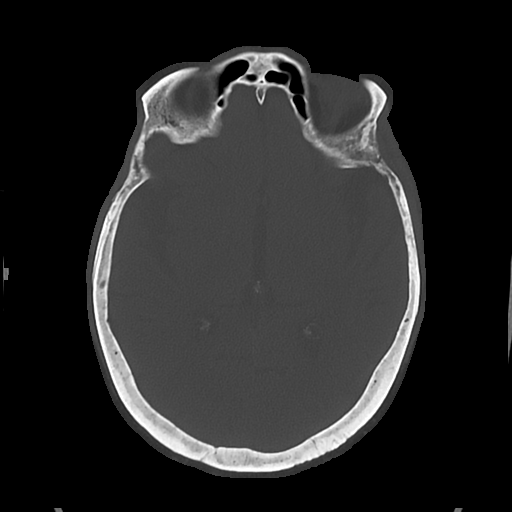

[Series 5: cor soft · coronal · 0.35mm/px · 3 of 71 slices shown]
[im 24/71  brain]
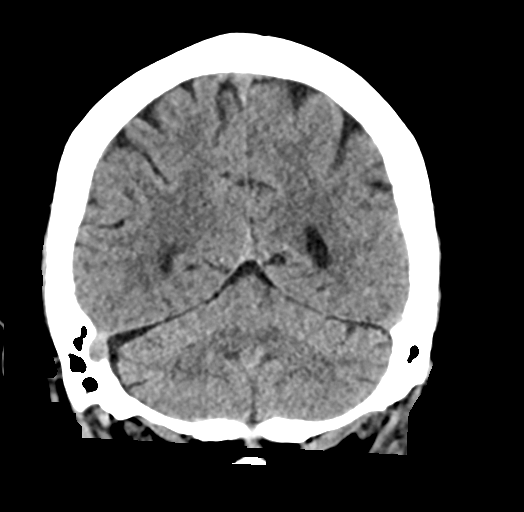
[im 32/71  brain]
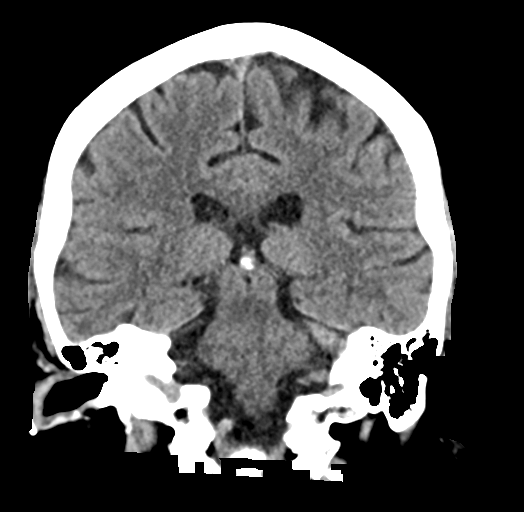
[im 39/71  brain]
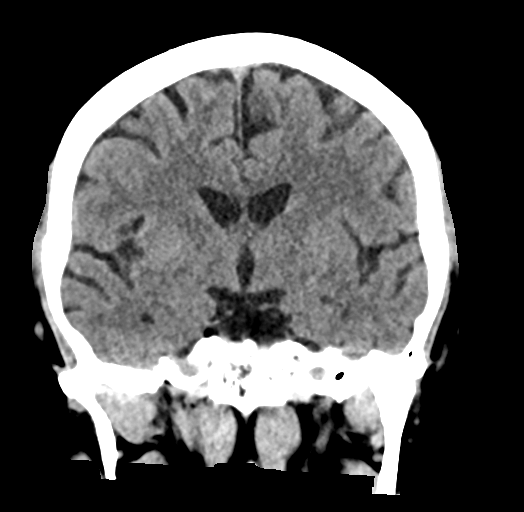

[Series 6: sag soft · sagittal · 0.35mm/px · 3 of 60 slices shown]
[im 20/60  brain]
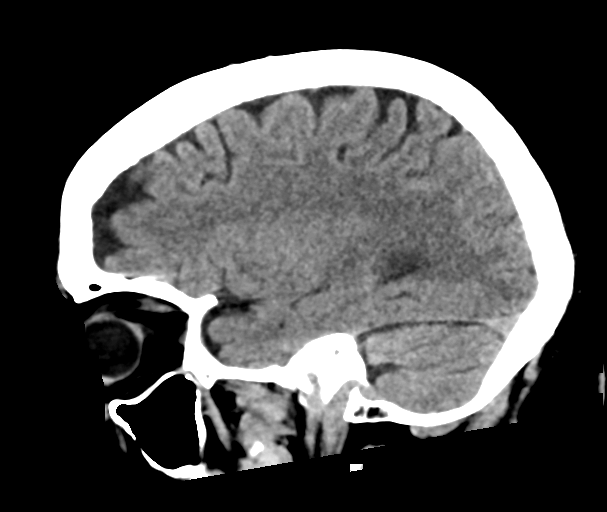
[im 30/60  brain]
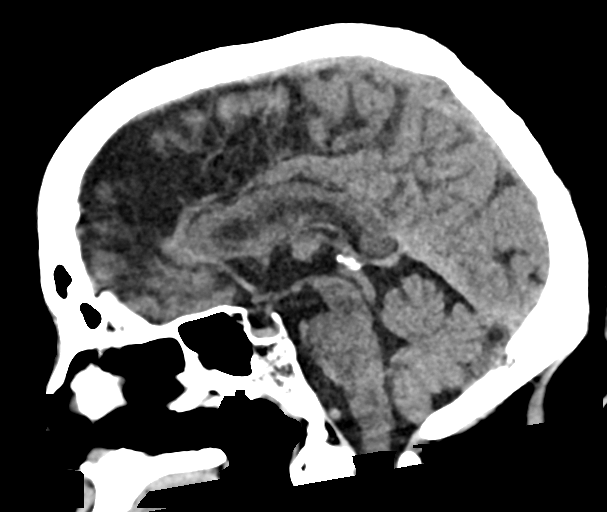
[im 40/60  brain]
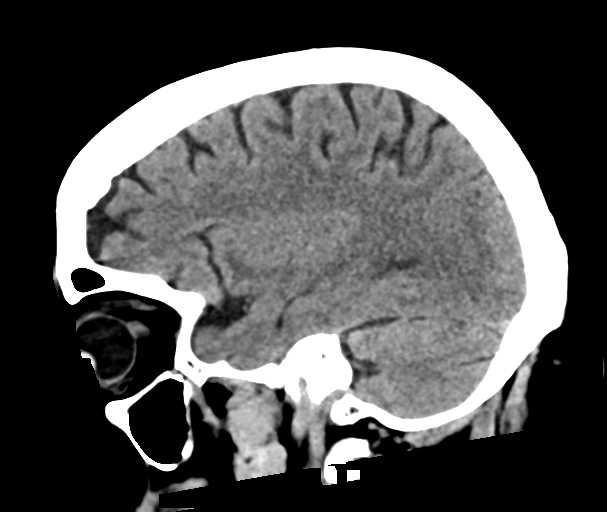

[17 of 47 positions shown; findings below may reference images not displayed]

FINDINGS: Brain: No evidence of acute infarction, hemorrhage, hydrocephalus,
extra-axial collection or mass lesion/mass effect.

Vascular: No hyperdense vessel.  Atherosclerotic calcification.

Skull: Normal. Negative for fracture or focal lesion.

Sinuses/Orbits: Essentially clear sinuses. Bone island or other
benign process posterior to the right sphenoid sinus.

Other: These results were communicated to Dr. Tichka at [DATE] Sheriann
03/07/2019by text page via the [employer] messaging system.

ASPECTS ([place] Stroke Program Early CT Score)

- Ganglionic level infarction (caudate, lentiform nuclei, internal
capsule, insula, M1-M3 cortex): 7

- Supraganglionic infarction (M4-M6 cortex): 3

Total score (0-10 with 10 being normal): 10
IMPRESSION: 1. No acute finding.
2. ASPECTS is 10.

## 2020-10-13 MED ORDER — HYDROCHLOROTHIAZIDE 12.5 MG PO CAPS: 12.5000 mg | ORAL_CAPSULE | Freq: Every day | ORAL | 3 refills | Status: DC

## 2020-10-13 MED ORDER — LOSARTAN POTASSIUM 100 MG PO TABS: 100.0000 mg | ORAL_TABLET | Freq: Every day | ORAL | 3 refills | Status: DC

## 2020-10-13 NOTE — Assessment & Plan Note (Signed)
Stable period.  

## 2020-10-13 NOTE — Assessment & Plan Note (Signed)
Through OBGYN - but has not recently seen. Consider updating.

## 2020-10-13 NOTE — Assessment & Plan Note (Signed)
Continues aspirin, plavix, rec restart repatha.

## 2020-10-13 NOTE — Assessment & Plan Note (Signed)
BP elevated today - continue amlodipine, carvedilol, hctz and losartan.

## 2020-10-13 NOTE — Assessment & Plan Note (Signed)
No longer on PPI - will monitor off therapy.

## 2020-10-13 NOTE — Assessment & Plan Note (Signed)
TSH remains high - will increase levothyroxine dose to 65mcg daily.

## 2020-10-13 NOTE — Assessment & Plan Note (Signed)
Stable period on lantus as evidenced by A1c <8%.

## 2020-10-13 NOTE — Assessment & Plan Note (Signed)
S/p L mastectomy 2015 Lost to f/u - will order R breast screening mammogram and advised to call and schedule.

## 2020-10-13 NOTE — Assessment & Plan Note (Signed)
She continues seroquel with benefit.

## 2020-10-13 NOTE — Assessment & Plan Note (Signed)
Stable period on sertraline 100mg  daily.

## 2020-10-13 NOTE — Assessment & Plan Note (Signed)
This has normalized - will resolve.

## 2020-10-13 NOTE — Assessment & Plan Note (Addendum)
Marked deterioration with LDL up to 185 and triglycerides 383 - they have not been taking Repatha. rec restart injections.  The ASCVD Risk score Mikey Bussing DC Jr., et al., 2013) failed to calculate for the following reasons:   The patient has a prior MI or stroke diagnosis

## 2020-10-13 NOTE — Assessment & Plan Note (Signed)
Ongoing difficulty - they use [employer] lift at home to assist with transfers.

## 2020-10-13 NOTE — Assessment & Plan Note (Signed)
Intolerant to statins in the past (crestor, lipitor, pravastatin, lovastatin). Needs to restart Repatha

## 2020-10-13 NOTE — Assessment & Plan Note (Addendum)
Will start weekly vit D replacement x12 months then reassess.

## 2020-10-15 NOTE — Telephone Encounter (Signed)
Lvm asking pt's niece, [name] (on dpr), to call back.  Dr. Darnell Level has written rx for [employer] diapers.  Need to know if they will pick up rx or if preferred we mail it.  [Rx placed at front office- yellow folders.]

## 2020-10-15 NOTE — Telephone Encounter (Signed)
Lvm asking pt/pt's niece, [name] (on dpr), to call back.  Need to relay Dr. Synthia Innocent message.

## 2020-10-17 NOTE — Telephone Encounter (Signed)
Mailing a letter.

## 2020-10-31 ENCOUNTER — Encounter: Payer: Self-pay | Admitting: Adult Health

## 2020-10-31 ENCOUNTER — Telehealth (INDEPENDENT_AMBULATORY_CARE_PROVIDER_SITE_OTHER): Payer: Medicare Other | Admitting: Adult Health

## 2020-10-31 DIAGNOSIS — E785 Hyperlipidemia, unspecified: Secondary | ICD-10-CM | POA: Diagnosis not present

## 2020-10-31 DIAGNOSIS — E1122 Type 2 diabetes mellitus with diabetic chronic kidney disease: Secondary | ICD-10-CM

## 2020-10-31 DIAGNOSIS — G4733 Obstructive sleep apnea (adult) (pediatric): Secondary | ICD-10-CM | POA: Diagnosis not present

## 2020-10-31 DIAGNOSIS — N183 Chronic kidney disease, stage 3 unspecified: Secondary | ICD-10-CM | POA: Diagnosis not present

## 2020-10-31 DIAGNOSIS — F0151 Vascular dementia with behavioral disturbance: Secondary | ICD-10-CM | POA: Diagnosis not present

## 2020-10-31 DIAGNOSIS — Z8673 Personal history of transient ischemic attack (TIA), and cerebral infarction without residual deficits: Secondary | ICD-10-CM | POA: Diagnosis not present

## 2020-10-31 DIAGNOSIS — I10 Essential (primary) hypertension: Secondary | ICD-10-CM

## 2020-10-31 DIAGNOSIS — Z9989 Dependence on other enabling machines and devices: Secondary | ICD-10-CM

## 2020-10-31 DIAGNOSIS — F01518 Vascular dementia, unspecified severity, with other behavioral disturbance: Secondary | ICD-10-CM

## 2020-10-31 MED ORDER — SERTRALINE HCL 100 MG PO TABS: 100.0000 mg | ORAL_TABLET | Freq: Every day | ORAL | 3 refills | Status: AC

## 2020-10-31 MED ORDER — MEMANTINE HCL 10 MG PO TABS: 10.0000 mg | ORAL_TABLET | Freq: Two times a day (BID) | ORAL | 5 refills | Status: DC

## 2020-10-31 MED ORDER — MELATONIN 3 MG PO TABS: 3.0000 mg | ORAL_TABLET | Freq: Every day | ORAL | 0 refills | Status: AC

## 2020-10-31 MED ORDER — MEMANTINE HCL 28 X 5 MG & 21 X 10 MG PO TABS: ORAL_TABLET | ORAL | 0 refills | Status: DC

## 2020-10-31 MED ORDER — BACLOFEN 10 MG PO TABS: 10.0000 mg | ORAL_TABLET | Freq: Every day | ORAL | 11 refills | Status: AC

## 2020-10-31 NOTE — Progress Notes (Signed)
[facility] [address] [place]. [place] [address] [phone]       STROKE FOLLOW UP NOTE  Julia Banks Date of Birth:  1941-03-29 Medical Record Number:  [id number]   Reason for Referral: stroke follow up   Virtual Visit via Video Note  Virtual visit completed through [employer], a video enabled telemedicine application. Due to national recommendations of social distancing due to COVID-19, a virtual visit is felt to be most appropriate for this patient at this time. Reviewed limitations, risks, security and privacy concerns of performing a virtual visit and the availability of in person appointments. I also reviewed that there may be a patient responsible charge related to this service. The patient/family agreed to proceed.    Patient location: home Provider location: in office, [facility] Persons participating in this virtual visit: patient, Harrie Jeans (son in law)     HPI:   Today, 10/31/2020, Julia Banks is being seen via MyChart video visit for follow-up with history of stroke and dementia after prior visit almost 10 months ago.  She is accompanied by her son-in-law, Harrie Jeans.  He does report gradual decline with dementia with increased short-term memory, occasional incontinence of urine, varied appetite, occasional confusion and sleep-wake disturbance. Increased falls but usually due to patient trying to get up by herself although she is typically nonambulatory.  Occasional swallowing difficulty as she will try to put too much in her mouth at one time.  All above symptoms have been gradual and denies any acute worsening or changes.  She has remained on Zoloft for depression which has been stable.  Remains on Seroquel for behaviors and insomnia.  She has not previously tried [place] or Aricept.  She continues to live with her daughter [name] and son-in-law Harrie Jeans who provide 24/7 care.  Stable from stroke standpoint without new stroke/TIA  symptoms. Remains on aspirin without associated side effects.  Repatha discontinued by family 1 month ago due to frequent headaches which subsided after discontinuing.  Blood pressure and glucose levels monitored at home which have been stable.   No further concerns at this time     History provided for reference purposes only Update 01/23/2020 JM: Julia Banks returns for follow-up accompanied by her niece.  Residual stroke deficits of left spastic hemiparesis, dysarthria and likely vascular cognitive impairment with behaviors.  Niece contacted office via MyChart due to continued behaviors with insomnia, delusions, agitation and visual hallucinations.  Seroquel and sertraline dosages adjusted currently on Seroquel 75 mg and sertraline 100 mg nightly.  Tolerating medications well without side effects with improvement of behaviors.  MMSE today 21/30.  Limited mobility due to residual stroke deficits and history of left ankle fracture and transfers via wheelchair.  Does report occasional occipital headaches which have been present since stroke and typically subside after Tylenol.  Remains on aspirin 81 mg daily and Repatha without side effects.  Blood pressure today 120/80.  Glucose levels stable.  Recent lab work this morning by PCP.  No concerns at this time.  Update 09/26/2019 JM: Julia Banks is a 80 year old female who is being seen today, 09/26/2019, for stroke follow-up accompanied by her niece.  Residual stroke deficits cognitive impairment with behaviors and left spastic hemiparesis.  At prior visit, concerns of worsening cognition and behaviors with MRI unremarkable and dementia panel slightly elevated homocysteine level likely clinically insignificant.  Refused EEG.  Niece denies worsening of cognition but does endorse fluctuation of paranoia, hallucinations, anxiety and insomnia.  No benefit with  use of Depakote with possible side effects therefore discontinued.  Previously on Lexapro and  recommended switching to sertraline as well as initiating Seroquel after prior visit.  Reports behaviors and depression initially greatly improved but has been seeing increased fluctuation of dementia related behaviors including paranoia, hallucinations, anxiety and insomnia.  MMSE today 28/30 (prior 21/30).  Recent fall 1.5 weeks ago with possible reinjury to previously left fractured ankle and does have scheduled follow-up with orthopedics for further evaluation.  Continues on aspirin 81 mg daily and Repatha for secondary stroke prevention without side effects.  Blood pressure today 136/66.  Update 07/27/2019: Julia Banks is a 80 year old female who is being seen today by request of her niece due to worsening cognition and behavioral concerns.  Patient is accompanied today by nieces son.  Family started to observe worsening memory approximately 4 weeks ago and did worsen even further with recent diagnosis of Covid approximately 2 to 3 weeks ago. She also unfortunately had a fall around 05/2019 resulting in left fibular fracture therefore has not been able to precipitate in any therapies as she is nonweightbearing at this time.  Family member does state memory/confusion is typically worse in the evening along with worsening of dysarthria.  Daughter sent my chart message which prompted today's visit as she has been hallucinating, increased agitation and irritability especially after recent fall last week.  She did not seek medical evaluation after fall.  She is fatigued majority of the day but sleeps well at night.  Residual stroke deficits of dysarthria and left spastic hemiparesis with slight worsening of LLE due to recent fracture.  She does continue to follow with physical medicine and rehab and continues on baclofen 10 mg nightly.  She does continue to complain of pain due to spasticity.  Continues on aspirin 81 mg daily and pravastatin 40 mg daily for secondary stroke prevention effects.  Blood pressure  today 153/66.  No further concerns at this time.  Initial visit 05/05/2019: Julia Banks is a 80 year old female who is being seen today for hospital follow-up accompanied by her niece.  Residual deficits of left-sided weakness and dysarthria.  Continues to participate with home health PT/OT with ongoing improvement.  Currently in a wheelchair which she uses for long distance but is able to ambulate short distances with hemiwalker.  She does endorse increased pain left upper and lower extremity with occasional spasms.  She is also been experiencing headaches since discharge that can occur frontal and occipital sometimes radiating up from her neck.  Headaches have been improving and will use Tylenol with benefit.  Her niece has moved in with her to assist with ADLs and ongoing care.  She has completed 3 weeks DAPT and continues on aspirin alone without bleeding or bruising.  She does have scheduled lipid clinic evaluation in January to initiate Repatha due to history of statin intolerance as well as Zetia intolerance.  Start a 30-day cardiac event monitor on 04/30/1927.  Blood pressure today 164/73.  Monitors at home and typically ranges 140s-180s/60s-70s.  Continues to follow with PCP and cardiology with ongoing adjustment to antihypertensives.  No further concerns at this time.  Denies new or worsening stroke/TIA symptoms.   Stroke admission 03/07/2019: Julia Banks is a 80 y.o. female with history of iabetes, thyroid disease, sensorineural hearing loss, hypothyroidism, hypertension, hyperlipidemia, cluster headaches, CAD, CKD stage III  who presented on 03/07/2019 with left-sided weakness and decreased sensation.  Stroke work-up showed right AchA territory infarct as evidenced on  MRI most likely secondary to small vessel disease however embolic event cannot be ruled out given AchA is coming out directly from carotid artery.  CTA head/neck negative E LVO but did show arthrosclerosis.  MRI showed right basal  ganglia and corona radiata infarct at right AchA territory.  2D echo showed an EF of 60 to 65%.  Recommended 30-day cardiac event monitor outpatient to rule out A. fib due to recent stroke and history of intermittent heart palpitations.  LDL 144.  A1c 7.2.  Recommended DAPT for 3 weeks and aspirin alone.  HTN stable.  History of statin intolerance and recommended PCP follow-up for continued Repatha.  Uncontrolled DM and recommend close PCP follow-up.  Other stroke risk factors include advanced age, family history of stroke, CAD and OSA on CPAP.  Other active problems include history of left breast cancer status postmastectomy 2015,  CKD stage III,  Hypothyroidism, secondary hyperparathyroidism and sensorineural hearing loss bilaterally.  Residual deficits of left hemiparesis and discharged to CIR for ongoing therapy.       PMH:  Past Medical History:  Diagnosis Date  . Arthritis   . Asthma   . Breast cancer, left breast ([place]) 02/2014   DCIS, ER+ PR- initially rec bilat mastectomy with sentinal LN biopsy L axilla but now planning just L mastectomy [name])  . Chronic kidney disease, stage 3 ([place])   . Colon polyps    benign  . Complication of anesthesia    hard to wake up  . Coronary artery disease    30% stenosis reported on previous cardiac catheterization in 2006. This was done in [facility]  . COVID-19 virus infection 07/2019  . GERD (gastroesophageal reflux disease)   . Headaches, cluster   . Hyperlipidemia   . Hypertension   . Hypothyroidism   . Osteopenia 07/2014   DEXA 0.5 spine, 01.7 R femoral neck  . Pneumonia    years ago  . Secondary hyperparathyroidism of renal origin ([place])    likely  . Sensorineural hearing loss (SNHL) of both ears 01/27/2016   Audiology eval - moderately severe sensorineural hearing loss bilaterally rec amplification (07/2016 - [name] audiologist)  . Sleep apnea    on CPAP  . Thyroid disease   . Type 2 diabetes,  uncontrolled, with neuropathy ([place])    followed by endo Dr Buddy Duty    PSH:  Past Surgical History:  Procedure Laterality Date  . ABDOMINAL HYSTERECTOMY     heavy bleeding, ovaries remain  . APPENDECTOMY    . BREAST BIOPSY Right    benign  . BREAST BIOPSY Left 02/2014   ?DCIS  . CARDIAC CATHETERIZATION  2006   [place], [place].   . CARDIAC CATHETERIZATION  10/2013   mild 2v nonobstructive disease, EF 60%  . CARDIOVASCULAR STRESS TEST  09/2012   WNL ([place])  . CHOLECYSTECTOMY    . COLONOSCOPY  05/2010   2 TAs, diverticulosis, rpt 2 yrs (Dr Elissa Hefty in [place])  . COLONOSCOPY  12/2014   1 TA, rpt 5 yrs Henrene Pastor)  . cortisone right knee  01/04/15  . GALLBLADDER SURGERY    . MASTECTOMY W/ SENTINEL NODE BIOPSY Left 06/07/2014   DR WAKEFIELD  . SIMPLE MASTECTOMY WITH AXILLARY SENTINEL NODE BIOPSY Left 06/07/2014   Procedure: LEFT TOTAL MASTECTOMY WITH LEFT AXILLARY SENTINEL NODE BIOPSY;  Surgeon: Rolm Bookbinder, MD;  Location: [facility];  Service: General;  Laterality: Left;    Social History:  Social History   Socioeconomic History  .  Marital status: Widowed    Spouse name: Not on file  . Number of children: 0  . Years of education: Not on file  . Highest education level: Not on file  Occupational History  . Not on file  Tobacco Use  . Smoking status: Never Smoker  . Smokeless tobacco: Never Used  . Tobacco comment: second hand exposure  Vaping Use  . Vaping Use: Never used  Substance and Sexual Activity  . Alcohol use: No  . Drug use: No  . Sexual activity: Not on file  Other Topics Concern  . Not on file  Social History Narrative   Widow, husband Fritz Pickerel passed 53.  Has 3 dogs   No children. Niece [name] supportive.    Now living with [name] and [place]   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ist, nutritional   Edu: 1 yr college   Activity: stationary bicycle, some walking   Diet: good water, fruits/vegetables daily      G0P0   Social  Determinants of Radio broadcast assistant Strain: Not on file  Food Insecurity: Not on file  Transportation Needs: Not on file  Physical Activity: Not on file  Stress: Not on file  Social Connections: Not on file  Intimate Partner Violence: Not on file    Family History:  Family History  Problem Relation Age of Onset  . Arthritis Mother   . Hyperlipidemia Mother   . Stroke Mother   . Hypertension Mother   . Diabetes Mother   . Other Mother        parathyroid adenoma  . Arthritis Father   . Hyperlipidemia Father   . CAD Father        MI  . Hypertension Father   . Breast cancer Other   . Ovarian cancer Other   . Leukemia Brother        dx in his 90s  . Colon cancer Maternal Uncle        dx in his 63s  . Ovarian cancer Maternal Grandmother 90  . Melanoma Other 27       dx 2x - 28 and 29/dx 3x - ages 58, 18, and 76  . Cancer Neg Hx   . Crohn's disease Neg Hx   . Colon polyps Neg Hx   . Ulcerative colitis Neg Hx   . Stomach cancer Neg Hx   . Rectal cancer Neg Hx     Medications:   Current Outpatient Medications on File Prior to Visit  Medication Sig Dispense Refill  . acetaminophen (TYLENOL) 325 MG tablet Take 2 tablets (650 mg total) by mouth 3 (three) times daily as needed for mild pain (or temp > 37.5 C (99.5 F)). 30 tablet 0  . ADVAIR DISKUS 250-50 MCG/DOSE AEPB TAKE 1 PUFF BY MOUTH TWICE A DAY 60 each 2  . albuterol (VENTOLIN HFA) 108 (90 Base) MCG/ACT inhaler TAKE 2 PUFFS BY MOUTH EVERY 6 HOURS AS NEEDED FOR WHEEZE OR SHORTNESS OF BREATH 8.5 each 3  . amLODipine (NORVASC) 10 MG tablet TAKE 1 TABLET BY MOUTH EVERY DAY 90 tablet 3  . amoxicillin-clavulanate (AUGMENTIN) 875-125 MG tablet Take 1 tablet by mouth 2 (two) times daily. One po bid x 7 days 14 tablet 0  . Ascorbic Acid (VITAMIN C WITH ROSE HIPS) 500 MG tablet Take 500 mg by mouth daily.    Marland Kitchen aspirin EC 81 MG EC tablet Take 1 tablet (81 mg total) by mouth daily. 30 tablet 2  .  baclofen (LIORESAL) 10 MG  tablet Take 1 tablet (10 mg total) by mouth at bedtime. 30 each 3  . benzonatate (TESSALON) 100 MG capsule Take 1-2 capsules (100-200 mg total) by mouth 3 (three) times daily as needed for cough. 20 capsule 0  . carvedilol (COREG) 6.25 MG tablet TAKE 1 TABLET BY MOUTH TWICE A DAY 60 tablet 0  . Cholecalciferol (VITAMIN D3) 1.25 MG (50000 UT) TABS Take 1 tablet by mouth once a week. 12 tablet 3  . clopidogrel (PLAVIX) 75 MG tablet Take 1 tablet (75 mg total) by mouth daily.    . famotidine (PEPCID) 20 MG tablet TAKE 1 TABLET BY MOUTH EVERYDAY AT BEDTIME 90 tablet 1  . hydrochlorothiazide (MICROZIDE) 12.5 MG capsule Take 1 capsule (12.5 mg total) by mouth daily. 90 capsule 3  . insulin glargine (LANTUS) 100 UNIT/ML injection Inject 0.42 mLs (42 Units total) into the skin daily. 40 mL 3  . Insulin Pen Needle (BD PEN NEEDLE NANO U/F) 32G X 4 MM MISC USE 1 AS DIRECTED TO INJECT MEDICATION DAILY. 100 each 3  . Insulin Syringe-Needle U-100 31G X 15/64" 1 ML MISC Use as instructed to administer insulin daily. 100 each 3  . levothyroxine (SYNTHROID) 50 MCG tablet Take 1 tablet (50 mcg total) by mouth daily before breakfast. 90 tablet 3  . losartan (COZAAR) 100 MG tablet Take 1 tablet (100 mg total) by mouth daily. 90 tablet 3  . QUEtiapine (SEROQUEL) 50 MG tablet Take 1 tablet (50 mg total) by mouth in the morning AND 1.5 tablets (75 mg total) at bedtime. 225 tablet 3  . REPATHA SURECLICK [id number] MG/ML SOAJ INJECT 1 PEN INTO THE SKIN EVERY 14 DAYS. 2 mL 4  . sertraline (ZOLOFT) 100 MG tablet Take 1 tablet (100 mg total) by mouth at bedtime. 90 tablet 3   No current facility-administered medications on file prior to visit.    Allergies:   Allergies  Allergen Reactions  . Statins Other (See Comments)    myalgias  . Altace [Ramipril] Other (See Comments)    Upset stomach  . Aspirin Nausea And Vomiting  . Metformin And Related Other (See Comments)    GI upset            ASSESSMENT: Julia Banks is a 80 y.o. year old female presented with left-sided weakness and decreased sensation on 03/07/2019 with stroke work-up revealing right basal ganglia and corona radiata infarct at right AchA territory most likely secondary to small vessel disease however embolic source cannot be ruled out.  30-day cardiac event monitor negative for A. fib.  Vascular risk factors include HTN, HLD, DM, CAD and OSA.  She has been doing well from a stroke standpoint with improvement of left spastic hemiparesis as well as cognition with MMSE 28/30 (prior 21/30).  Continues to have fluctuation of dementia related behaviors with initiation of Seroquel 25 mg nightly and switching SSRI from Lexapro to sertraline  namenda titration pack Melatonin 5mg   Consider starting praulent - intolerant to repatha    PLAN:  1. Vascular dementia with behaviors: a. Gradual progression per familiy -recommend trialing Namenda titration with end goal of 10mg  BID.  Discussed with son potential side effects and goal of medication is to help slow the progression and not necessarily improve cognition.  Namenda may also be beneficial for behaviors. b. Insomnia/sleep-wake disturbance: Trial melatonin 3-6mg  nightly.  Continue Seroquel 50 mg AM and 75 mg PM.  Also discussed importance of good sleep hygiene  and increasing daytime activity with ensuring bright lights during the day and dark at night (as safety permits) c. MMSE unable to be obtained due to visit type (prior scores 10/2019 21/30;07/2019 21/30; 08/2019 28/30) d. Continue sertraline 100 mg nightly for ongoing management of behaviors and underlying depression e. Discussed importance of managing stroke risk factors, maintaining adequate diet, ensuring restful sleep and good sleep hygiene and activity as tolerated 2. Hx of Right AchA territory stroke:  a. Residual deficits: Left spastic hemiparesis, dysarthria and cognitive impairment -stable b. Continue aspirin 81 mg daily for  secondary stroke prevention.   c. Continue baclofen 5-10mg  nightly or as needed for spasticity d. Ensure close PCP follow-up for aggressive stroke risk factor management 3. HTN: BP goal<130/90.  Stable per report monitor by PCP 4. HLD: LDL goal <70.  Recent LDL 76.6. self d/c'd Repatha due to headaches with resolution.  Recommend trialing Praluent as she has history of intolerance to multiple statins. Family wishes to speak further with PCP but will let me know if they would like to start 5. DMII: A1c goal <7.0.  Recent A1c 7.2 up from 6.9 routinely monitored by PCP 6. OSA on CPAP: Continuation of compliance    Follow up in 6 months or call earlier if needed  CC:  [facility] provider: Dr. Bobbie Stack, Garlon Hatchet, MD     Frann Rider, [facility]  [facility] [address] [place], [place] [address]  Phone [phone] Fax [phone] Note: This document was prepared with digital dictation and possible smart phrase technology. Any transcriptional errors that result from this process are unintentional.

## 2020-10-31 NOTE — Progress Notes (Signed)
I agree with the above plan 

## 2020-11-05 ENCOUNTER — Telehealth: Payer: Medicare Other | Admitting: Physician Assistant

## 2020-11-05 DIAGNOSIS — J019 Acute sinusitis, unspecified: Secondary | ICD-10-CM

## 2020-11-05 DIAGNOSIS — B9689 Other specified bacterial agents as the cause of diseases classified elsewhere: Secondary | ICD-10-CM

## 2020-11-06 MED ORDER — AMOXICILLIN-POT CLAVULANATE 875-125 MG PO TABS: 1.0000 | ORAL_TABLET | Freq: Two times a day (BID) | ORAL | 0 refills | Status: DC

## 2020-11-06 MED ORDER — BENZONATATE 100 MG PO CAPS: 100.0000 mg | ORAL_CAPSULE | Freq: Three times a day (TID) | ORAL | 0 refills | Status: DC | PRN

## 2020-11-06 NOTE — Progress Notes (Signed)
We are sorry that you are not feeling well.  Here is how we plan to help!  Based on what you have shared with me it looks like you have sinusitis.  Sinusitis is inflammation and infection in the sinus cavities of the head.  Based on your presentation I believe you most likely have Acute Bacterial Sinusitis.  This is an infection caused by bacteria and is treated with antibiotics. I have prescribed Augmentin 875mg /125mg  one tablet twice daily with food, for 10 days. I will also send in a prescription for Benzonatate (Tessalon) for cough. You may use an oral decongestant such as Mucinex D or if you have glaucoma or high blood pressure use plain Mucinex. Saline nasal spray help and can safely be used as often as needed for congestion.  If you develop worsening sinus pain, fever or notice severe headache and vision changes, or if symptoms are not better after completion of antibiotic, please schedule an appointment with a health care provider.    Sinus infections are not as easily transmitted as other respiratory infection, however we still recommend that you avoid close contact with loved ones, especially the very young and elderly.  Remember to wash your hands thoroughly throughout the day as this is the number one way to prevent the spread of infection!  Home Care:  Only take medications as instructed by your medical team.  Complete the entire course of an antibiotic.  Do not take these medications with alcohol.  A steam or ultrasonic humidifier can help congestion.  You can place a towel over your head and breathe in the steam from hot water coming from a faucet.  Avoid close contacts especially the very young and the elderly.  Cover your mouth when you cough or sneeze.  Always remember to wash your hands.  Get Help Right Away If:  You develop worsening fever or sinus pain.  You develop a severe head ache or visual changes.  Your symptoms persist after you have completed your treatment  plan.  Make sure you  Understand these instructions.  Will watch your condition.  Will get help right away if you are not doing well or get worse.  Your e-visit answers were reviewed by a board certified advanced clinical practitioner to complete your personal care plan.  Depending on the condition, your plan could have included both over the counter or prescription medications.  If there is a problem please reply  once you have received a response from your provider.  Your safety is important to [place].  If you have drug allergies check your prescription carefully.    You can use MyChart to ask questions about today's visit, request a non-urgent call back, or ask for a work or school excuse for 24 hours related to this e-Visit. If it has been greater than 24 hours you will need to follow up with your provider, or enter a new e-Visit to address those concerns.  You will get an e-mail in the next two days asking about your experience.  I hope that your e-visit has been valuable and will speed your recovery. Thank you for using e-visits.

## 2020-11-06 NOTE — Progress Notes (Signed)
I have spent 5 minutes in review of e-visit questionnaire, review and updating patient chart, medical decision making and response to patient.   Keelin Sheridan Cody Camay Pedigo, PA-C    

## 2020-11-07 ENCOUNTER — Telehealth: Payer: Self-pay | Admitting: Medical

## 2020-11-07 ENCOUNTER — Ambulatory Visit: Payer: Medicare Other

## 2020-11-07 NOTE — Telephone Encounter (Signed)
  Patient Consent for Virtual Visit         Julia Banks has provided verbal consent on 11/07/2020 for a virtual visit (video or telephone).   CONSENT FOR VIRTUAL VISIT FOR:  Julia Banks  By participating in this virtual visit I agree to the following:  I hereby voluntarily request, consent and authorize [facility] and its employed or contracted physicians, physician assistants, nurse practitioners or other licensed health care professionals (the Practitioner), to provide me with telemedicine health care services (the "Services") as deemed necessary by the treating Practitioner. I acknowledge and consent to receive the Services by the Practitioner via telemedicine. I understand that the telemedicine visit will involve communicating with the Practitioner through live audiovisual communication technology and the disclosure of certain medical information by electronic transmission. I acknowledge that I have been given the opportunity to request an in-person assessment or other available alternative prior to the telemedicine visit and am voluntarily participating in the telemedicine visit.  I understand that I have the right to withhold or withdraw my consent to the use of telemedicine in the course of my care at any time, without affecting my right to future care or treatment, and that the Practitioner or I may terminate the telemedicine visit at any time. I understand that I have the right to inspect all information obtained and/or recorded in the course of the telemedicine visit and may receive copies of available information for a reasonable fee.  I understand that some of the potential risks of receiving the Services via telemedicine include:  Marland Kitchen Delay or interruption in medical evaluation due to technological equipment failure or disruption; . Information transmitted may not be sufficient (e.g. poor resolution of images) to allow for appropriate medical decision making by the  Practitioner; and/or  . In rare instances, security protocols could fail, causing a breach of personal health information.  Furthermore, I acknowledge that it is my responsibility to provide information about my medical history, conditions and care that is complete and accurate to the best of my ability. I acknowledge that Practitioner's advice, recommendations, and/or decision may be based on factors not within their control, such as incomplete or inaccurate data provided by me or distortions of diagnostic images or specimens that may result from electronic transmissions. I understand that the practice of medicine is not an exact science and that Practitioner makes no warranties or guarantees regarding treatment outcomes. I acknowledge that a copy of this consent can be made available to me via my patient portal ([facility]), or I can request a printed copy by calling the office of [facility].    I understand that my insurance will be billed for this visit.   I have read or had this consent read to me. . I understand the contents of this consent, which adequately explains the benefits and risks of the Services being provided via telemedicine.  . I have been provided ample opportunity to ask questions regarding this consent and the Services and have had my questions answered to my satisfaction. . I give my informed consent for the services to be provided through the use of telemedicine in my medical care

## 2020-11-08 NOTE — Progress Notes (Signed)
Virtual Visit via Video Note   This visit type was conducted due to national recommendations for restrictions regarding the COVID-19 Pandemic (e.g. social distancing) in an effort to limit this patient's exposure and mitigate transmission in our community.  Due to her co-morbid illnesses, this patient is at least at moderate risk for complications without adequate follow up.  This format is felt to be most appropriate for this patient at this time.  All issues noted in this document were discussed and addressed.  A limited physical exam was performed with this format.  Please refer to the patient's chart for her consent to telehealth for [facility].  Video Connection Lost Video connection was lost at < 50% of the duration of this visit, at which time the remainder of the visit was completed via audio only.     Date:  11/09/2020   ID:  Julia, Banks [id number], MRN [id number] The patient was identified using 2 identifiers.  Patient Location: Home Provider Location: Office/Clinic   PCP:  Ria Bush, MD   [facility] Providers Cardiologist:  Dr. Fletcher Anon    Evaluation Performed:  Follow-Up Visit  Chief Complaint:  6 month follow-up  History of Present Illness:    Julia Banks is a 80 y.o. female with with a hx of nonobstructive CAD, diabetes, HTN, CKD, HLD, obesity, CVA in 2020  Seen in 2015 for nocturnal chest pain and underwent cardiac cath. Cath showed mild nonobstructuve 2V CAD (20% in the LCx and 30% in the RCA). EF was normal. It was felt nocturnal chest pain was from sleep apnea and improved with CPAP.   She had a stroke in September 2020. MRI of the brain showed acut einfarct of the right basal ganglia. Echo showed EF 60-65% with no significant valvular abnormalities. Stroke was felt to be small vessel, however embolic could not be ruled out. Heart monitor at d/c showed no afib.   Last seen 02/14/20 and was doing well from a cardiac standpoint.    Today, the patient reports no cardiac concerns. She has had upper respiratory cough on Augmentin and is improving. She reports chest pain. It is usually at night around bed time. It is all across upper chest. It is a burning sensation and some pressure. It is similar to reflux. She does not walk, uses a steady lift. This has not changed since the last visit. Breathing is a little short when she lays down, this has been worse with the cough. No LLE. Has brief palpitations, this unchanged. Worse with caffeine. She does not ambulate since she fractured her foot 2 years ago. Not a smoker. No alcohol use. BP is good, which is what it runs at home. Weights stable, maybe has lost some weight. Doesn't have regular weights. Eats fairly decent. She lives with her niece and her family. 6 month PCP visit.   The patient does have symptoms concerning for COVID-19 infection (fever, chills, cough, or new shortness of breath).    Past Medical History:  Diagnosis Date  . Arthritis   . Asthma   . Breast cancer, left breast ([place]) 02/2014   DCIS, ER+ PR- initially rec bilat mastectomy with sentinal LN biopsy L axilla but now planning just L mastectomy [name])  . Chronic kidney disease, stage 3 ([place])   . Colon polyps    benign  . Complication of anesthesia    hard to wake up  . Coronary artery disease    30% stenosis reported on  previous cardiac catheterization in 2006. This was done in [facility]  . COVID-19 virus infection 07/2019  . GERD (gastroesophageal reflux disease)   . Headaches, cluster   . Hyperlipidemia   . Hypertension   . Hypothyroidism   . Osteopenia 07/2014   DEXA 0.5 spine, 01.7 R femoral neck  . Pneumonia    years ago  . Secondary hyperparathyroidism of renal origin ([place])    likely  . Sensorineural hearing loss (SNHL) of both ears 01/27/2016   Audiology eval - moderately severe sensorineural hearing loss bilaterally rec amplification (07/2016 - [name]  audiologist)  . Sleep apnea    on CPAP  . Thyroid disease   . Type 2 diabetes, uncontrolled, with neuropathy ([place])    followed by endo Dr Buddy Duty   Past Surgical History:  Procedure Laterality Date  . ABDOMINAL HYSTERECTOMY     heavy bleeding, ovaries remain  . APPENDECTOMY    . BREAST BIOPSY Right    benign  . BREAST BIOPSY Left 02/2014   ?DCIS  . CARDIAC CATHETERIZATION  2006   [place], [place].   . CARDIAC CATHETERIZATION  10/2013   mild 2v nonobstructive disease, EF 60%  . CARDIOVASCULAR STRESS TEST  09/2012   WNL ([place])  . CHOLECYSTECTOMY    . COLONOSCOPY  05/2010   2 TAs, diverticulosis, rpt 2 yrs (Dr Elissa Hefty in [place])  . COLONOSCOPY  12/2014   1 TA, rpt 5 yrs Henrene Pastor)  . cortisone right knee  01/04/15  . GALLBLADDER SURGERY    . MASTECTOMY W/ SENTINEL NODE BIOPSY Left 06/07/2014   DR WAKEFIELD  . SIMPLE MASTECTOMY WITH AXILLARY SENTINEL NODE BIOPSY Left 06/07/2014   Procedure: LEFT TOTAL MASTECTOMY WITH LEFT AXILLARY SENTINEL NODE BIOPSY;  Surgeon: Rolm Bookbinder, MD;  Location: [facility];  Service: General;  Laterality: Left;     Current Meds  Medication Sig  . acetaminophen (TYLENOL) 325 MG tablet Take 2 tablets (650 mg total) by mouth 3 (three) times daily as needed for mild pain (or temp > 37.5 C (99.5 F)).  . ADVAIR DISKUS 250-50 MCG/DOSE AEPB TAKE 1 PUFF BY MOUTH TWICE A DAY  . albuterol (VENTOLIN HFA) 108 (90 Base) MCG/ACT inhaler TAKE 2 PUFFS BY MOUTH EVERY 6 HOURS AS NEEDED FOR WHEEZE OR SHORTNESS OF BREATH  . Alirocumab (PRALUENT) 150 MG/ML SOAJ Inject 150 mg into the skin every 14 (fourteen) days.  Marland Kitchen amLODipine (NORVASC) 5 MG tablet Take 1 tablet by mouth daily.  Marland Kitchen amoxicillin-clavulanate (AUGMENTIN) 875-125 MG tablet Take 1 tablet by mouth 2 (two) times daily.  . Ascorbic Acid (VITAMIN C WITH ROSE HIPS) 500 MG tablet Take 500 mg by mouth daily.  Marland Kitchen aspirin EC 81 MG EC tablet Take 1 tablet (81 mg total) by mouth daily.  . baclofen (LIORESAL) 10 MG tablet Take 1  tablet (10 mg total) by mouth at bedtime.  . benzonatate (TESSALON) 100 MG capsule Take 1 capsule (100 mg total) by mouth 3 (three) times daily as needed for cough.  . carvedilol (COREG) 6.25 MG tablet TAKE 1 TABLET BY MOUTH TWICE A DAY  . Cholecalciferol (VITAMIN D3) 1.25 MG (50000 UT) TABS Take 1 tablet by mouth once a week.  . famotidine (PEPCID) 20 MG tablet TAKE 1 TABLET BY MOUTH EVERYDAY AT BEDTIME  . hydrochlorothiazide (MICROZIDE) 12.5 MG capsule Take 1 capsule (12.5 mg total) by mouth daily.  . insulin glargine (LANTUS) 100 UNIT/ML injection Inject 0.42 mLs (42 Units total) into the  skin daily.  . Insulin Pen Needle (BD PEN NEEDLE NANO U/F) 32G X 4 MM MISC USE 1 AS DIRECTED TO INJECT MEDICATION DAILY.  Marland Kitchen Insulin Syringe-Needle U-100 31G X 15/64" 1 ML MISC Use as instructed to administer insulin daily.  . isosorbide mononitrate (IMDUR) 30 MG 24 hr tablet Take 1 tablet by mouth every 8 (eight) hours as needed.  Marland Kitchen levothyroxine (SYNTHROID) 50 MCG tablet Take 1 tablet (50 mcg total) by mouth daily before breakfast.  . losartan (COZAAR) 100 MG tablet Take 1 tablet (100 mg total) by mouth daily.  . melatonin 3 MG TABS tablet Take 1 tablet (3 mg total) by mouth at bedtime.  . memantine (NAMENDA TITRATION PAK) tablet pack 5 mg/day for =1 week; 5 mg twice daily for =1 week; 15 mg/day given in 5 mg and 10 mg separated doses for =1 week; then 10 mg twice daily  . [START ON 11/30/2020] memantine (NAMENDA) 10 MG tablet Take 1 tablet (10 mg total) by mouth 2 (two) times daily. Start after completion of titration pack  . metoprolol succinate (TOPROL-XL) 100 MG 24 hr tablet Take 1 tablet by mouth daily.  . potassium chloride SA (KLOR-CON) 20 MEQ tablet Take 1 tablet by mouth daily.  . QUEtiapine (SEROQUEL) 50 MG tablet Take 1 tablet (50 mg total) by mouth in the morning AND 1.5 tablets (75 mg total) at bedtime.  . sertraline (ZOLOFT) 100 MG tablet Take 1 tablet (100 mg total) by mouth at bedtime.  .  Vitamin D, Ergocalciferol, (DRISDOL) 1.25 MG (50000 UNIT) CAPS capsule Take 1 capsule by mouth once a week.  . [DISCONTINUED] atorvastatin (LIPITOR) 40 MG tablet Take 1 tablet by mouth daily.     Allergies:   Statins, Altace [ramipril], Aspirin, and Metformin and related   Social History   Tobacco Use  . Smoking status: Never Smoker  . Smokeless tobacco: Never Used  . Tobacco comment: second hand exposure  Vaping Use  . Vaping Use: Never used  Substance Use Topics  . Alcohol use: No  . Drug use: No     Family Hx: The patient's family history includes Arthritis in her father and mother; Breast cancer in an other family member; CAD in her father; Colon cancer in her maternal uncle; Diabetes in her mother; Hyperlipidemia in her father and mother; Hypertension in her father and mother; Leukemia in her brother; Melanoma (age of onset: 76) in an other family member; Other in her mother; Ovarian cancer in an other family member; Ovarian cancer (age of onset: 47) in her maternal grandmother; Stroke in her mother. There is no history of Cancer, Crohn's disease, Colon polyps, Ulcerative colitis, Stomach cancer, or Rectal cancer.  ROS:   Please see the history of present illness.     All other systems reviewed and are negative.   Prior CV studies:   The following studies were reviewed today:  Echo 03/2019 1. The left ventricle has normal systolic function with an ejection  fraction of 60-65%. The cavity size was normal. There is mildly increased  left ventricular wall thickness. Left ventricular diastolic Doppler  parameters are consistent with impaired  relaxation.  2. The aortic root is normal in size and structure.  3. The aortic valve is tricuspid. No stenosis of the aortic valve.  4. The right ventricle has normal systolic function. The cavity was  normal. There is no increase in right ventricular wall thickness.  5. No evidence of mitral valve stenosis. No significant mitral  regurgitation.  6. The IVC was normal in size. No complete TR doppler jet so unable to  Heart monitor 05/2019  Normal sinus rhythm with an average heart rate of 62 bpm. No evidence of atrial fibrillation.   Labs/Other Tests and Data Reviewed:    EKG:  No ECG reviewed.  Recent Labs: 10/04/2020: ALT 9; BUN 17; Creatinine, Ser 1.20; Hemoglobin 13.3; Platelets 187.0; Potassium 3.9; Sodium 139; TSH 5.09   Recent Lipid Panel Lab Results  Component Value Date/Time   CHOL 323 (H) 10/04/2020 08:12 AM   CHOL 149 07/27/2019 01:44 PM   TRIG 383.0 (H) 10/04/2020 08:12 AM   HDL 39.50 10/04/2020 08:12 AM   HDL 39 (L) 07/27/2019 01:44 PM   CHOLHDL 8 10/04/2020 08:12 AM   LDLCALC 73 07/27/2019 01:44 PM   LDLDIRECT 185.0 10/04/2020 08:12 AM    Wt Readings from Last 3 Encounters:  11/09/20 188 lb (85.3 kg)  09/26/19 188 lb (85.3 kg)  08/16/19 188 lb (85.3 kg)      Objective:    Vital Signs:  BP 136/72   Ht 5\' 6"  (1.676 m)   Wt 188 lb (85.3 kg)   BMI 30.34 kg/m    VITAL SIGNS:  reviewed  ASSESSMENT & PLAN:    Nonobstructive CAD  Has atypical chest pain. Heart cath in 2015 showed no CAD. Chest discomfort has improved over the years. Chest discomfort sounds more GI in nature. She is on PPI. Patient is wheelchair bound, unable to assess exertional symptoms. If chest pain frequency increases can consider increasing Imdur.Continue Aspirin. Will start Praluent. Continue BB.   HTN BP is wnl. Continue amlodipine 5 mg, Coreg 6.25mg  daily, HCTZ 12.5mg  daily, Imdur 30 mg daily, Losartan 100mg  daily.   HLD Direct LDL 185. Repatha was giving her headaches. Her Niece administers injections as she is a NP.  She is not on a statin, she has tried multiple statins and most have caused joint aches. I will send in [place] and refer to lipid clinic.   H/o stroke Followed by neurology  HFpEF Niece reports she is euvolemic one xam. No significant LLE, orthopnea, pnd. Echo in 2020 showed normal  LVEF. No indication to repeat at this time. Continue Losartan and coreg. She is on HCTZ.   DM2 A1C 7.2. Diet could be better. Followed by PCP  Dementia  Rapid progression of demetnia since stroke. Recently started on Namenda by nurology.   COVID-19 Education: The signs and symptoms of COVID-19 were discussed with the patient and how to seek care for testing (follow up with PCP or arrange E-visit).  The importance of social distancing was discussed today.  Time:   Today, I have spent 30 minutes with the patient with telehealth technology discussing the above problems.     Medication Adjustments/Labs and Tests Ordered: Current medicines are reviewed at length with the patient today.  Concerns regarding medicines are outlined above.   Tests Ordered: Orders Placed This Encounter  Procedures  . AMB Referral to [facility]    Medication Changes: Meds ordered this encounter  Medications  . Alirocumab (PRALUENT) 150 MG/ML SOAJ    Sig: Inject 150 mg into the skin every 14 (fourteen) days.    Dispense:  2 mL    Refill:  11    Follow Up:  In Person in 6 month(s)  Signed, Chiyo Fay Ninfa Meeker, PA-C  11/09/2020 1:51 PM    [facility]

## 2020-11-09 ENCOUNTER — Other Ambulatory Visit: Payer: Self-pay

## 2020-11-09 ENCOUNTER — Encounter: Payer: Self-pay | Admitting: Medical

## 2020-11-09 ENCOUNTER — Telehealth: Payer: Self-pay

## 2020-11-09 ENCOUNTER — Telehealth (INDEPENDENT_AMBULATORY_CARE_PROVIDER_SITE_OTHER): Payer: Medicare Other | Admitting: Medical

## 2020-11-09 VITALS — BP 136/72 | Ht 66.0 in | Wt 188.0 lb

## 2020-11-09 DIAGNOSIS — I5032 Chronic diastolic (congestive) heart failure: Secondary | ICD-10-CM

## 2020-11-09 DIAGNOSIS — I639 Cerebral infarction, unspecified: Secondary | ICD-10-CM | POA: Diagnosis not present

## 2020-11-09 DIAGNOSIS — I2510 Atherosclerotic heart disease of native coronary artery without angina pectoris: Secondary | ICD-10-CM

## 2020-11-09 DIAGNOSIS — E785 Hyperlipidemia, unspecified: Secondary | ICD-10-CM | POA: Diagnosis not present

## 2020-11-09 DIAGNOSIS — Z789 Other specified health status: Secondary | ICD-10-CM | POA: Diagnosis not present

## 2020-11-09 DIAGNOSIS — E1169 Type 2 diabetes mellitus with other specified complication: Secondary | ICD-10-CM

## 2020-11-09 DIAGNOSIS — F0390 Unspecified dementia without behavioral disturbance: Secondary | ICD-10-CM

## 2020-11-09 MED ORDER — PRALUENT 150 MG/ML ~~LOC~~ SOAJ: 150.0000 mg | SUBCUTANEOUS | 11 refills | Status: DC

## 2020-11-09 NOTE — Patient Instructions (Signed)
Medication Instructions:   1.  START taking Praluent 150 ML/MG one injection every 14 days.   *If you need a refill on your cardiac medications before your next appointment, please call your pharmacy*   Lab Work:  None ordered    Testing/Procedures:  None ordered   Follow-Up: At [facility], you and your health needs are our priority.  As part of our continuing mission to provide you with exceptional heart care, we have created designated Provider Care Teams.  These Care Teams include your primary Cardiologist (physician) and Advanced Practice Providers (APPs -  Physician Assistants and Nurse Practitioners) who all work together to provide you with the care you need, when you need it.  We recommend signing up for the patient portal called "MyChart".  Sign up information is provided on this After Visit Summary.  MyChart is used to connect with patients for Virtual Visits (Telemedicine).  Patients are able to view lab/test results, encounter notes, upcoming appointments, etc.  Non-urgent messages can be sent to your provider as well.   To learn more about what you can do with MyChart, go to [email or web address].    Your next appointment:   6 month(s)  The format for your next appointment:   In Person  Provider:   Kathlyn Sacramento, MD   Other Instructions  A Referral was sent into our [facility], they will reach out to you .

## 2020-11-09 NOTE — Telephone Encounter (Signed)
PA started through [employer] Key: [id number]  PA Case ID: [id number] Rx #: [id number]  Awaiting determination.

## 2020-11-09 NOTE — Telephone Encounter (Signed)
PA for praluent was approved.   LHTDSK:[id number] Status:Approved Review Type:Prior Auth Coverage Start Date:10/10/2020 Coverage End Date:11/09/2021

## 2020-11-15 ENCOUNTER — Other Ambulatory Visit: Payer: Self-pay | Admitting: Cardiovascular Disease

## 2020-11-16 ENCOUNTER — Encounter: Payer: Self-pay | Admitting: Family Medicine

## 2020-11-16 DIAGNOSIS — E1169 Type 2 diabetes mellitus with other specified complication: Secondary | ICD-10-CM

## 2020-11-16 DIAGNOSIS — E785 Hyperlipidemia, unspecified: Secondary | ICD-10-CM

## 2020-11-22 MED ORDER — ROSUVASTATIN CALCIUM 5 MG PO TABS: 5.0000 mg | ORAL_TABLET | Freq: Every day | ORAL | 3 refills | Status: DC

## 2020-11-22 MED ORDER — EZETIMIBE 10 MG PO TABS: 10.0000 mg | ORAL_TABLET | Freq: Every day | ORAL | 3 refills | Status: DC

## 2021-01-15 ENCOUNTER — Encounter: Payer: Self-pay | Admitting: Family Medicine

## 2021-01-15 DIAGNOSIS — F01518 Vascular dementia, unspecified severity, with other behavioral disturbance: Secondary | ICD-10-CM

## 2021-01-15 DIAGNOSIS — I69354 Hemiplegia and hemiparesis following cerebral infarction affecting left non-dominant side: Secondary | ICD-10-CM

## 2021-01-15 DIAGNOSIS — N183 Chronic kidney disease, stage 3 unspecified: Secondary | ICD-10-CM

## 2021-01-15 DIAGNOSIS — Z8673 Personal history of transient ischemic attack (TIA), and cerebral infarction without residual deficits: Secondary | ICD-10-CM

## 2021-01-15 DIAGNOSIS — F0151 Vascular dementia with behavioral disturbance: Secondary | ICD-10-CM

## 2021-01-15 DIAGNOSIS — E1122 Type 2 diabetes mellitus with diabetic chronic kidney disease: Secondary | ICD-10-CM

## 2021-01-18 DIAGNOSIS — F0150 Vascular dementia without behavioral disturbance: Secondary | ICD-10-CM | POA: Insufficient documentation

## 2021-01-21 ENCOUNTER — Telehealth: Payer: Self-pay | Admitting: Family Medicine

## 2021-01-21 DIAGNOSIS — I69354 Hemiplegia and hemiparesis following cerebral infarction affecting left non-dominant side: Secondary | ICD-10-CM | POA: Diagnosis not present

## 2021-01-21 DIAGNOSIS — Z853 Personal history of malignant neoplasm of breast: Secondary | ICD-10-CM | POA: Diagnosis not present

## 2021-01-21 DIAGNOSIS — I672 Cerebral atherosclerosis: Secondary | ICD-10-CM | POA: Diagnosis not present

## 2021-01-21 DIAGNOSIS — E039 Hypothyroidism, unspecified: Secondary | ICD-10-CM | POA: Diagnosis not present

## 2021-01-21 DIAGNOSIS — E1122 Type 2 diabetes mellitus with diabetic chronic kidney disease: Secondary | ICD-10-CM | POA: Diagnosis not present

## 2021-01-21 DIAGNOSIS — I2510 Atherosclerotic heart disease of native coronary artery without angina pectoris: Secondary | ICD-10-CM | POA: Diagnosis not present

## 2021-01-21 DIAGNOSIS — I129 Hypertensive chronic kidney disease with stage 1 through stage 4 chronic kidney disease, or unspecified chronic kidney disease: Secondary | ICD-10-CM | POA: Diagnosis not present

## 2021-01-21 DIAGNOSIS — E559 Vitamin D deficiency, unspecified: Secondary | ICD-10-CM | POA: Diagnosis not present

## 2021-01-21 DIAGNOSIS — G44009 Cluster headache syndrome, unspecified, not intractable: Secondary | ICD-10-CM | POA: Diagnosis not present

## 2021-01-21 DIAGNOSIS — N183 Chronic kidney disease, stage 3 unspecified: Secondary | ICD-10-CM | POA: Diagnosis not present

## 2021-01-21 DIAGNOSIS — E785 Hyperlipidemia, unspecified: Secondary | ICD-10-CM | POA: Diagnosis not present

## 2021-01-21 DIAGNOSIS — G4733 Obstructive sleep apnea (adult) (pediatric): Secondary | ICD-10-CM | POA: Diagnosis not present

## 2021-01-21 DIAGNOSIS — F32A Depression, unspecified: Secondary | ICD-10-CM | POA: Diagnosis not present

## 2021-01-21 NOTE — Telephone Encounter (Signed)
[name] called in wanted to know if Dr. Darnell Level could fax progess notes from [facility] 10/10/20. The nurse is going out there at 1 this afternoon. The notes can be faxed to [phone]

## 2021-01-22 DIAGNOSIS — N183 Chronic kidney disease, stage 3 unspecified: Secondary | ICD-10-CM | POA: Diagnosis not present

## 2021-01-22 DIAGNOSIS — I2510 Atherosclerotic heart disease of native coronary artery without angina pectoris: Secondary | ICD-10-CM | POA: Diagnosis not present

## 2021-01-22 DIAGNOSIS — I672 Cerebral atherosclerosis: Secondary | ICD-10-CM | POA: Diagnosis not present

## 2021-01-22 DIAGNOSIS — I69354 Hemiplegia and hemiparesis following cerebral infarction affecting left non-dominant side: Secondary | ICD-10-CM | POA: Diagnosis not present

## 2021-01-22 DIAGNOSIS — E1122 Type 2 diabetes mellitus with diabetic chronic kidney disease: Secondary | ICD-10-CM | POA: Diagnosis not present

## 2021-01-22 DIAGNOSIS — I129 Hypertensive chronic kidney disease with stage 1 through stage 4 chronic kidney disease, or unspecified chronic kidney disease: Secondary | ICD-10-CM | POA: Diagnosis not present

## 2021-01-22 NOTE — Telephone Encounter (Signed)
Spoke with Tammy of [facility] to get clarification of message.  States pt was admitted yesterday, so the don't need notes faxed at this time.  However, they may send a fax requesting other info.

## 2021-01-23 DIAGNOSIS — E1122 Type 2 diabetes mellitus with diabetic chronic kidney disease: Secondary | ICD-10-CM | POA: Diagnosis not present

## 2021-01-23 DIAGNOSIS — N183 Chronic kidney disease, stage 3 unspecified: Secondary | ICD-10-CM | POA: Diagnosis not present

## 2021-01-23 DIAGNOSIS — I129 Hypertensive chronic kidney disease with stage 1 through stage 4 chronic kidney disease, or unspecified chronic kidney disease: Secondary | ICD-10-CM | POA: Diagnosis not present

## 2021-01-23 DIAGNOSIS — I2510 Atherosclerotic heart disease of native coronary artery without angina pectoris: Secondary | ICD-10-CM | POA: Diagnosis not present

## 2021-01-23 DIAGNOSIS — I69354 Hemiplegia and hemiparesis following cerebral infarction affecting left non-dominant side: Secondary | ICD-10-CM | POA: Diagnosis not present

## 2021-01-23 DIAGNOSIS — I672 Cerebral atherosclerosis: Secondary | ICD-10-CM | POA: Diagnosis not present

## 2021-01-24 DIAGNOSIS — N183 Chronic kidney disease, stage 3 unspecified: Secondary | ICD-10-CM | POA: Diagnosis not present

## 2021-01-24 DIAGNOSIS — I2510 Atherosclerotic heart disease of native coronary artery without angina pectoris: Secondary | ICD-10-CM | POA: Diagnosis not present

## 2021-01-24 DIAGNOSIS — E1122 Type 2 diabetes mellitus with diabetic chronic kidney disease: Secondary | ICD-10-CM | POA: Diagnosis not present

## 2021-01-24 DIAGNOSIS — I69354 Hemiplegia and hemiparesis following cerebral infarction affecting left non-dominant side: Secondary | ICD-10-CM | POA: Diagnosis not present

## 2021-01-24 DIAGNOSIS — I129 Hypertensive chronic kidney disease with stage 1 through stage 4 chronic kidney disease, or unspecified chronic kidney disease: Secondary | ICD-10-CM | POA: Diagnosis not present

## 2021-01-24 DIAGNOSIS — I672 Cerebral atherosclerosis: Secondary | ICD-10-CM | POA: Diagnosis not present

## 2021-01-27 DIAGNOSIS — E1122 Type 2 diabetes mellitus with diabetic chronic kidney disease: Secondary | ICD-10-CM | POA: Diagnosis not present

## 2021-01-27 DIAGNOSIS — I672 Cerebral atherosclerosis: Secondary | ICD-10-CM | POA: Diagnosis not present

## 2021-01-27 DIAGNOSIS — I69354 Hemiplegia and hemiparesis following cerebral infarction affecting left non-dominant side: Secondary | ICD-10-CM | POA: Diagnosis not present

## 2021-01-27 DIAGNOSIS — I129 Hypertensive chronic kidney disease with stage 1 through stage 4 chronic kidney disease, or unspecified chronic kidney disease: Secondary | ICD-10-CM | POA: Diagnosis not present

## 2021-01-27 DIAGNOSIS — I2510 Atherosclerotic heart disease of native coronary artery without angina pectoris: Secondary | ICD-10-CM | POA: Diagnosis not present

## 2021-01-27 DIAGNOSIS — N183 Chronic kidney disease, stage 3 unspecified: Secondary | ICD-10-CM | POA: Diagnosis not present

## 2021-01-28 DIAGNOSIS — G44009 Cluster headache syndrome, unspecified, not intractable: Secondary | ICD-10-CM | POA: Diagnosis not present

## 2021-01-28 DIAGNOSIS — F32A Depression, unspecified: Secondary | ICD-10-CM | POA: Diagnosis not present

## 2021-01-28 DIAGNOSIS — N183 Chronic kidney disease, stage 3 unspecified: Secondary | ICD-10-CM | POA: Diagnosis not present

## 2021-01-28 DIAGNOSIS — I672 Cerebral atherosclerosis: Secondary | ICD-10-CM | POA: Diagnosis not present

## 2021-01-28 DIAGNOSIS — I69354 Hemiplegia and hemiparesis following cerebral infarction affecting left non-dominant side: Secondary | ICD-10-CM | POA: Diagnosis not present

## 2021-01-28 DIAGNOSIS — E1122 Type 2 diabetes mellitus with diabetic chronic kidney disease: Secondary | ICD-10-CM | POA: Diagnosis not present

## 2021-01-28 DIAGNOSIS — E785 Hyperlipidemia, unspecified: Secondary | ICD-10-CM | POA: Diagnosis not present

## 2021-01-28 DIAGNOSIS — E559 Vitamin D deficiency, unspecified: Secondary | ICD-10-CM | POA: Diagnosis not present

## 2021-01-28 DIAGNOSIS — Z853 Personal history of malignant neoplasm of breast: Secondary | ICD-10-CM | POA: Diagnosis not present

## 2021-01-28 DIAGNOSIS — I129 Hypertensive chronic kidney disease with stage 1 through stage 4 chronic kidney disease, or unspecified chronic kidney disease: Secondary | ICD-10-CM | POA: Diagnosis not present

## 2021-01-28 DIAGNOSIS — I2510 Atherosclerotic heart disease of native coronary artery without angina pectoris: Secondary | ICD-10-CM | POA: Diagnosis not present

## 2021-01-28 DIAGNOSIS — G4733 Obstructive sleep apnea (adult) (pediatric): Secondary | ICD-10-CM | POA: Diagnosis not present

## 2021-01-28 DIAGNOSIS — E039 Hypothyroidism, unspecified: Secondary | ICD-10-CM | POA: Diagnosis not present

## 2021-01-30 DIAGNOSIS — I129 Hypertensive chronic kidney disease with stage 1 through stage 4 chronic kidney disease, or unspecified chronic kidney disease: Secondary | ICD-10-CM | POA: Diagnosis not present

## 2021-01-30 DIAGNOSIS — I672 Cerebral atherosclerosis: Secondary | ICD-10-CM | POA: Diagnosis not present

## 2021-01-30 DIAGNOSIS — N183 Chronic kidney disease, stage 3 unspecified: Secondary | ICD-10-CM | POA: Diagnosis not present

## 2021-01-30 DIAGNOSIS — E1122 Type 2 diabetes mellitus with diabetic chronic kidney disease: Secondary | ICD-10-CM | POA: Diagnosis not present

## 2021-01-30 DIAGNOSIS — I69354 Hemiplegia and hemiparesis following cerebral infarction affecting left non-dominant side: Secondary | ICD-10-CM | POA: Diagnosis not present

## 2021-01-30 DIAGNOSIS — I2510 Atherosclerotic heart disease of native coronary artery without angina pectoris: Secondary | ICD-10-CM | POA: Diagnosis not present

## 2021-01-31 DIAGNOSIS — N183 Chronic kidney disease, stage 3 unspecified: Secondary | ICD-10-CM | POA: Diagnosis not present

## 2021-01-31 DIAGNOSIS — I672 Cerebral atherosclerosis: Secondary | ICD-10-CM | POA: Diagnosis not present

## 2021-01-31 DIAGNOSIS — E1122 Type 2 diabetes mellitus with diabetic chronic kidney disease: Secondary | ICD-10-CM | POA: Diagnosis not present

## 2021-01-31 DIAGNOSIS — I2510 Atherosclerotic heart disease of native coronary artery without angina pectoris: Secondary | ICD-10-CM | POA: Diagnosis not present

## 2021-01-31 DIAGNOSIS — I69354 Hemiplegia and hemiparesis following cerebral infarction affecting left non-dominant side: Secondary | ICD-10-CM | POA: Diagnosis not present

## 2021-01-31 DIAGNOSIS — I129 Hypertensive chronic kidney disease with stage 1 through stage 4 chronic kidney disease, or unspecified chronic kidney disease: Secondary | ICD-10-CM | POA: Diagnosis not present

## 2021-02-01 DIAGNOSIS — N183 Chronic kidney disease, stage 3 unspecified: Secondary | ICD-10-CM | POA: Diagnosis not present

## 2021-02-01 DIAGNOSIS — I129 Hypertensive chronic kidney disease with stage 1 through stage 4 chronic kidney disease, or unspecified chronic kidney disease: Secondary | ICD-10-CM | POA: Diagnosis not present

## 2021-02-01 DIAGNOSIS — I69354 Hemiplegia and hemiparesis following cerebral infarction affecting left non-dominant side: Secondary | ICD-10-CM | POA: Diagnosis not present

## 2021-02-01 DIAGNOSIS — I672 Cerebral atherosclerosis: Secondary | ICD-10-CM | POA: Diagnosis not present

## 2021-02-01 DIAGNOSIS — I2510 Atherosclerotic heart disease of native coronary artery without angina pectoris: Secondary | ICD-10-CM | POA: Diagnosis not present

## 2021-02-01 DIAGNOSIS — E1122 Type 2 diabetes mellitus with diabetic chronic kidney disease: Secondary | ICD-10-CM | POA: Diagnosis not present

## 2021-02-03 DIAGNOSIS — I69354 Hemiplegia and hemiparesis following cerebral infarction affecting left non-dominant side: Secondary | ICD-10-CM | POA: Diagnosis not present

## 2021-02-03 DIAGNOSIS — I129 Hypertensive chronic kidney disease with stage 1 through stage 4 chronic kidney disease, or unspecified chronic kidney disease: Secondary | ICD-10-CM | POA: Diagnosis not present

## 2021-02-03 DIAGNOSIS — I2510 Atherosclerotic heart disease of native coronary artery without angina pectoris: Secondary | ICD-10-CM | POA: Diagnosis not present

## 2021-02-03 DIAGNOSIS — I672 Cerebral atherosclerosis: Secondary | ICD-10-CM | POA: Diagnosis not present

## 2021-02-03 DIAGNOSIS — N183 Chronic kidney disease, stage 3 unspecified: Secondary | ICD-10-CM | POA: Diagnosis not present

## 2021-02-03 DIAGNOSIS — E1122 Type 2 diabetes mellitus with diabetic chronic kidney disease: Secondary | ICD-10-CM | POA: Diagnosis not present

## 2021-02-06 DIAGNOSIS — I672 Cerebral atherosclerosis: Secondary | ICD-10-CM | POA: Diagnosis not present

## 2021-02-06 DIAGNOSIS — E1122 Type 2 diabetes mellitus with diabetic chronic kidney disease: Secondary | ICD-10-CM | POA: Diagnosis not present

## 2021-02-06 DIAGNOSIS — I129 Hypertensive chronic kidney disease with stage 1 through stage 4 chronic kidney disease, or unspecified chronic kidney disease: Secondary | ICD-10-CM | POA: Diagnosis not present

## 2021-02-06 DIAGNOSIS — I69354 Hemiplegia and hemiparesis following cerebral infarction affecting left non-dominant side: Secondary | ICD-10-CM | POA: Diagnosis not present

## 2021-02-06 DIAGNOSIS — N183 Chronic kidney disease, stage 3 unspecified: Secondary | ICD-10-CM | POA: Diagnosis not present

## 2021-02-06 DIAGNOSIS — I2510 Atherosclerotic heart disease of native coronary artery without angina pectoris: Secondary | ICD-10-CM | POA: Diagnosis not present

## 2021-02-08 DIAGNOSIS — N183 Chronic kidney disease, stage 3 unspecified: Secondary | ICD-10-CM | POA: Diagnosis not present

## 2021-02-08 DIAGNOSIS — I129 Hypertensive chronic kidney disease with stage 1 through stage 4 chronic kidney disease, or unspecified chronic kidney disease: Secondary | ICD-10-CM | POA: Diagnosis not present

## 2021-02-08 DIAGNOSIS — E1122 Type 2 diabetes mellitus with diabetic chronic kidney disease: Secondary | ICD-10-CM | POA: Diagnosis not present

## 2021-02-08 DIAGNOSIS — I2510 Atherosclerotic heart disease of native coronary artery without angina pectoris: Secondary | ICD-10-CM | POA: Diagnosis not present

## 2021-02-08 DIAGNOSIS — I672 Cerebral atherosclerosis: Secondary | ICD-10-CM | POA: Diagnosis not present

## 2021-02-08 DIAGNOSIS — I69354 Hemiplegia and hemiparesis following cerebral infarction affecting left non-dominant side: Secondary | ICD-10-CM | POA: Diagnosis not present

## 2021-02-09 ENCOUNTER — Other Ambulatory Visit: Payer: Self-pay | Admitting: Adult Health

## 2021-02-10 DIAGNOSIS — I672 Cerebral atherosclerosis: Secondary | ICD-10-CM | POA: Diagnosis not present

## 2021-02-10 DIAGNOSIS — I129 Hypertensive chronic kidney disease with stage 1 through stage 4 chronic kidney disease, or unspecified chronic kidney disease: Secondary | ICD-10-CM | POA: Diagnosis not present

## 2021-02-10 DIAGNOSIS — I2510 Atherosclerotic heart disease of native coronary artery without angina pectoris: Secondary | ICD-10-CM | POA: Diagnosis not present

## 2021-02-10 DIAGNOSIS — I69354 Hemiplegia and hemiparesis following cerebral infarction affecting left non-dominant side: Secondary | ICD-10-CM | POA: Diagnosis not present

## 2021-02-10 DIAGNOSIS — N183 Chronic kidney disease, stage 3 unspecified: Secondary | ICD-10-CM | POA: Diagnosis not present

## 2021-02-10 DIAGNOSIS — E1122 Type 2 diabetes mellitus with diabetic chronic kidney disease: Secondary | ICD-10-CM | POA: Diagnosis not present

## 2021-02-13 DIAGNOSIS — I69354 Hemiplegia and hemiparesis following cerebral infarction affecting left non-dominant side: Secondary | ICD-10-CM | POA: Diagnosis not present

## 2021-02-13 DIAGNOSIS — I672 Cerebral atherosclerosis: Secondary | ICD-10-CM | POA: Diagnosis not present

## 2021-02-13 DIAGNOSIS — I129 Hypertensive chronic kidney disease with stage 1 through stage 4 chronic kidney disease, or unspecified chronic kidney disease: Secondary | ICD-10-CM | POA: Diagnosis not present

## 2021-02-13 DIAGNOSIS — E1122 Type 2 diabetes mellitus with diabetic chronic kidney disease: Secondary | ICD-10-CM | POA: Diagnosis not present

## 2021-02-13 DIAGNOSIS — N183 Chronic kidney disease, stage 3 unspecified: Secondary | ICD-10-CM | POA: Diagnosis not present

## 2021-02-13 DIAGNOSIS — I2510 Atherosclerotic heart disease of native coronary artery without angina pectoris: Secondary | ICD-10-CM | POA: Diagnosis not present

## 2021-02-14 DIAGNOSIS — I129 Hypertensive chronic kidney disease with stage 1 through stage 4 chronic kidney disease, or unspecified chronic kidney disease: Secondary | ICD-10-CM | POA: Diagnosis not present

## 2021-02-14 DIAGNOSIS — N183 Chronic kidney disease, stage 3 unspecified: Secondary | ICD-10-CM | POA: Diagnosis not present

## 2021-02-14 DIAGNOSIS — I672 Cerebral atherosclerosis: Secondary | ICD-10-CM | POA: Diagnosis not present

## 2021-02-14 DIAGNOSIS — I69354 Hemiplegia and hemiparesis following cerebral infarction affecting left non-dominant side: Secondary | ICD-10-CM | POA: Diagnosis not present

## 2021-02-14 DIAGNOSIS — I2510 Atherosclerotic heart disease of native coronary artery without angina pectoris: Secondary | ICD-10-CM | POA: Diagnosis not present

## 2021-02-14 DIAGNOSIS — E1122 Type 2 diabetes mellitus with diabetic chronic kidney disease: Secondary | ICD-10-CM | POA: Diagnosis not present

## 2021-02-15 DIAGNOSIS — I672 Cerebral atherosclerosis: Secondary | ICD-10-CM | POA: Diagnosis not present

## 2021-02-15 DIAGNOSIS — I2510 Atherosclerotic heart disease of native coronary artery without angina pectoris: Secondary | ICD-10-CM | POA: Diagnosis not present

## 2021-02-15 DIAGNOSIS — I69354 Hemiplegia and hemiparesis following cerebral infarction affecting left non-dominant side: Secondary | ICD-10-CM | POA: Diagnosis not present

## 2021-02-15 DIAGNOSIS — I129 Hypertensive chronic kidney disease with stage 1 through stage 4 chronic kidney disease, or unspecified chronic kidney disease: Secondary | ICD-10-CM | POA: Diagnosis not present

## 2021-02-15 DIAGNOSIS — E1122 Type 2 diabetes mellitus with diabetic chronic kidney disease: Secondary | ICD-10-CM | POA: Diagnosis not present

## 2021-02-15 DIAGNOSIS — N183 Chronic kidney disease, stage 3 unspecified: Secondary | ICD-10-CM | POA: Diagnosis not present

## 2021-02-17 DIAGNOSIS — I2510 Atherosclerotic heart disease of native coronary artery without angina pectoris: Secondary | ICD-10-CM | POA: Diagnosis not present

## 2021-02-17 DIAGNOSIS — E1122 Type 2 diabetes mellitus with diabetic chronic kidney disease: Secondary | ICD-10-CM | POA: Diagnosis not present

## 2021-02-17 DIAGNOSIS — I129 Hypertensive chronic kidney disease with stage 1 through stage 4 chronic kidney disease, or unspecified chronic kidney disease: Secondary | ICD-10-CM | POA: Diagnosis not present

## 2021-02-17 DIAGNOSIS — N183 Chronic kidney disease, stage 3 unspecified: Secondary | ICD-10-CM | POA: Diagnosis not present

## 2021-02-17 DIAGNOSIS — I672 Cerebral atherosclerosis: Secondary | ICD-10-CM | POA: Diagnosis not present

## 2021-02-17 DIAGNOSIS — I69354 Hemiplegia and hemiparesis following cerebral infarction affecting left non-dominant side: Secondary | ICD-10-CM | POA: Diagnosis not present

## 2021-02-20 DIAGNOSIS — N183 Chronic kidney disease, stage 3 unspecified: Secondary | ICD-10-CM | POA: Diagnosis not present

## 2021-02-20 DIAGNOSIS — I69354 Hemiplegia and hemiparesis following cerebral infarction affecting left non-dominant side: Secondary | ICD-10-CM | POA: Diagnosis not present

## 2021-02-20 DIAGNOSIS — I129 Hypertensive chronic kidney disease with stage 1 through stage 4 chronic kidney disease, or unspecified chronic kidney disease: Secondary | ICD-10-CM | POA: Diagnosis not present

## 2021-02-20 DIAGNOSIS — I2510 Atherosclerotic heart disease of native coronary artery without angina pectoris: Secondary | ICD-10-CM | POA: Diagnosis not present

## 2021-02-20 DIAGNOSIS — E1122 Type 2 diabetes mellitus with diabetic chronic kidney disease: Secondary | ICD-10-CM | POA: Diagnosis not present

## 2021-02-20 DIAGNOSIS — I672 Cerebral atherosclerosis: Secondary | ICD-10-CM | POA: Diagnosis not present

## 2021-02-21 DIAGNOSIS — I2510 Atherosclerotic heart disease of native coronary artery without angina pectoris: Secondary | ICD-10-CM | POA: Diagnosis not present

## 2021-02-21 DIAGNOSIS — I672 Cerebral atherosclerosis: Secondary | ICD-10-CM | POA: Diagnosis not present

## 2021-02-21 DIAGNOSIS — N183 Chronic kidney disease, stage 3 unspecified: Secondary | ICD-10-CM | POA: Diagnosis not present

## 2021-02-21 DIAGNOSIS — I129 Hypertensive chronic kidney disease with stage 1 through stage 4 chronic kidney disease, or unspecified chronic kidney disease: Secondary | ICD-10-CM | POA: Diagnosis not present

## 2021-02-21 DIAGNOSIS — E1122 Type 2 diabetes mellitus with diabetic chronic kidney disease: Secondary | ICD-10-CM | POA: Diagnosis not present

## 2021-02-21 DIAGNOSIS — I69354 Hemiplegia and hemiparesis following cerebral infarction affecting left non-dominant side: Secondary | ICD-10-CM | POA: Diagnosis not present

## 2021-02-22 DIAGNOSIS — E1122 Type 2 diabetes mellitus with diabetic chronic kidney disease: Secondary | ICD-10-CM | POA: Diagnosis not present

## 2021-02-22 DIAGNOSIS — I672 Cerebral atherosclerosis: Secondary | ICD-10-CM | POA: Diagnosis not present

## 2021-02-22 DIAGNOSIS — I2510 Atherosclerotic heart disease of native coronary artery without angina pectoris: Secondary | ICD-10-CM | POA: Diagnosis not present

## 2021-02-22 DIAGNOSIS — N183 Chronic kidney disease, stage 3 unspecified: Secondary | ICD-10-CM | POA: Diagnosis not present

## 2021-02-22 DIAGNOSIS — I129 Hypertensive chronic kidney disease with stage 1 through stage 4 chronic kidney disease, or unspecified chronic kidney disease: Secondary | ICD-10-CM | POA: Diagnosis not present

## 2021-02-22 DIAGNOSIS — I69354 Hemiplegia and hemiparesis following cerebral infarction affecting left non-dominant side: Secondary | ICD-10-CM | POA: Diagnosis not present

## 2021-02-24 DIAGNOSIS — E1122 Type 2 diabetes mellitus with diabetic chronic kidney disease: Secondary | ICD-10-CM | POA: Diagnosis not present

## 2021-02-24 DIAGNOSIS — I2510 Atherosclerotic heart disease of native coronary artery without angina pectoris: Secondary | ICD-10-CM | POA: Diagnosis not present

## 2021-02-24 DIAGNOSIS — I672 Cerebral atherosclerosis: Secondary | ICD-10-CM | POA: Diagnosis not present

## 2021-02-24 DIAGNOSIS — N183 Chronic kidney disease, stage 3 unspecified: Secondary | ICD-10-CM | POA: Diagnosis not present

## 2021-02-24 DIAGNOSIS — I69354 Hemiplegia and hemiparesis following cerebral infarction affecting left non-dominant side: Secondary | ICD-10-CM | POA: Diagnosis not present

## 2021-02-24 DIAGNOSIS — I129 Hypertensive chronic kidney disease with stage 1 through stage 4 chronic kidney disease, or unspecified chronic kidney disease: Secondary | ICD-10-CM | POA: Diagnosis not present

## 2021-02-27 DIAGNOSIS — N183 Chronic kidney disease, stage 3 unspecified: Secondary | ICD-10-CM | POA: Diagnosis not present

## 2021-02-27 DIAGNOSIS — I69354 Hemiplegia and hemiparesis following cerebral infarction affecting left non-dominant side: Secondary | ICD-10-CM | POA: Diagnosis not present

## 2021-02-27 DIAGNOSIS — I129 Hypertensive chronic kidney disease with stage 1 through stage 4 chronic kidney disease, or unspecified chronic kidney disease: Secondary | ICD-10-CM | POA: Diagnosis not present

## 2021-02-27 DIAGNOSIS — I672 Cerebral atherosclerosis: Secondary | ICD-10-CM | POA: Diagnosis not present

## 2021-02-27 DIAGNOSIS — I2510 Atherosclerotic heart disease of native coronary artery without angina pectoris: Secondary | ICD-10-CM | POA: Diagnosis not present

## 2021-02-27 DIAGNOSIS — E1122 Type 2 diabetes mellitus with diabetic chronic kidney disease: Secondary | ICD-10-CM | POA: Diagnosis not present

## 2021-02-28 DIAGNOSIS — E785 Hyperlipidemia, unspecified: Secondary | ICD-10-CM | POA: Diagnosis not present

## 2021-02-28 DIAGNOSIS — G44009 Cluster headache syndrome, unspecified, not intractable: Secondary | ICD-10-CM | POA: Diagnosis not present

## 2021-02-28 DIAGNOSIS — E039 Hypothyroidism, unspecified: Secondary | ICD-10-CM | POA: Diagnosis not present

## 2021-02-28 DIAGNOSIS — E1122 Type 2 diabetes mellitus with diabetic chronic kidney disease: Secondary | ICD-10-CM | POA: Diagnosis not present

## 2021-02-28 DIAGNOSIS — N183 Chronic kidney disease, stage 3 unspecified: Secondary | ICD-10-CM | POA: Diagnosis not present

## 2021-02-28 DIAGNOSIS — Z853 Personal history of malignant neoplasm of breast: Secondary | ICD-10-CM | POA: Diagnosis not present

## 2021-02-28 DIAGNOSIS — I672 Cerebral atherosclerosis: Secondary | ICD-10-CM | POA: Diagnosis not present

## 2021-02-28 DIAGNOSIS — F32A Depression, unspecified: Secondary | ICD-10-CM | POA: Diagnosis not present

## 2021-02-28 DIAGNOSIS — I2510 Atherosclerotic heart disease of native coronary artery without angina pectoris: Secondary | ICD-10-CM | POA: Diagnosis not present

## 2021-02-28 DIAGNOSIS — G4733 Obstructive sleep apnea (adult) (pediatric): Secondary | ICD-10-CM | POA: Diagnosis not present

## 2021-02-28 DIAGNOSIS — I69354 Hemiplegia and hemiparesis following cerebral infarction affecting left non-dominant side: Secondary | ICD-10-CM | POA: Diagnosis not present

## 2021-02-28 DIAGNOSIS — E559 Vitamin D deficiency, unspecified: Secondary | ICD-10-CM | POA: Diagnosis not present

## 2021-02-28 DIAGNOSIS — I129 Hypertensive chronic kidney disease with stage 1 through stage 4 chronic kidney disease, or unspecified chronic kidney disease: Secondary | ICD-10-CM | POA: Diagnosis not present

## 2021-03-01 DIAGNOSIS — I672 Cerebral atherosclerosis: Secondary | ICD-10-CM | POA: Diagnosis not present

## 2021-03-01 DIAGNOSIS — I69354 Hemiplegia and hemiparesis following cerebral infarction affecting left non-dominant side: Secondary | ICD-10-CM | POA: Diagnosis not present

## 2021-03-01 DIAGNOSIS — E1122 Type 2 diabetes mellitus with diabetic chronic kidney disease: Secondary | ICD-10-CM | POA: Diagnosis not present

## 2021-03-01 DIAGNOSIS — I129 Hypertensive chronic kidney disease with stage 1 through stage 4 chronic kidney disease, or unspecified chronic kidney disease: Secondary | ICD-10-CM | POA: Diagnosis not present

## 2021-03-01 DIAGNOSIS — N183 Chronic kidney disease, stage 3 unspecified: Secondary | ICD-10-CM | POA: Diagnosis not present

## 2021-03-01 DIAGNOSIS — I2510 Atherosclerotic heart disease of native coronary artery without angina pectoris: Secondary | ICD-10-CM | POA: Diagnosis not present

## 2021-03-03 DIAGNOSIS — I129 Hypertensive chronic kidney disease with stage 1 through stage 4 chronic kidney disease, or unspecified chronic kidney disease: Secondary | ICD-10-CM | POA: Diagnosis not present

## 2021-03-03 DIAGNOSIS — I2510 Atherosclerotic heart disease of native coronary artery without angina pectoris: Secondary | ICD-10-CM | POA: Diagnosis not present

## 2021-03-03 DIAGNOSIS — N183 Chronic kidney disease, stage 3 unspecified: Secondary | ICD-10-CM | POA: Diagnosis not present

## 2021-03-03 DIAGNOSIS — E1122 Type 2 diabetes mellitus with diabetic chronic kidney disease: Secondary | ICD-10-CM | POA: Diagnosis not present

## 2021-03-03 DIAGNOSIS — I69354 Hemiplegia and hemiparesis following cerebral infarction affecting left non-dominant side: Secondary | ICD-10-CM | POA: Diagnosis not present

## 2021-03-03 DIAGNOSIS — I672 Cerebral atherosclerosis: Secondary | ICD-10-CM | POA: Diagnosis not present

## 2021-03-06 DIAGNOSIS — I2510 Atherosclerotic heart disease of native coronary artery without angina pectoris: Secondary | ICD-10-CM | POA: Diagnosis not present

## 2021-03-06 DIAGNOSIS — I69354 Hemiplegia and hemiparesis following cerebral infarction affecting left non-dominant side: Secondary | ICD-10-CM | POA: Diagnosis not present

## 2021-03-06 DIAGNOSIS — I129 Hypertensive chronic kidney disease with stage 1 through stage 4 chronic kidney disease, or unspecified chronic kidney disease: Secondary | ICD-10-CM | POA: Diagnosis not present

## 2021-03-06 DIAGNOSIS — I672 Cerebral atherosclerosis: Secondary | ICD-10-CM | POA: Diagnosis not present

## 2021-03-06 DIAGNOSIS — N183 Chronic kidney disease, stage 3 unspecified: Secondary | ICD-10-CM | POA: Diagnosis not present

## 2021-03-06 DIAGNOSIS — E1122 Type 2 diabetes mellitus with diabetic chronic kidney disease: Secondary | ICD-10-CM | POA: Diagnosis not present

## 2021-03-07 DIAGNOSIS — I2510 Atherosclerotic heart disease of native coronary artery without angina pectoris: Secondary | ICD-10-CM | POA: Diagnosis not present

## 2021-03-07 DIAGNOSIS — I672 Cerebral atherosclerosis: Secondary | ICD-10-CM | POA: Diagnosis not present

## 2021-03-07 DIAGNOSIS — N183 Chronic kidney disease, stage 3 unspecified: Secondary | ICD-10-CM | POA: Diagnosis not present

## 2021-03-07 DIAGNOSIS — E1122 Type 2 diabetes mellitus with diabetic chronic kidney disease: Secondary | ICD-10-CM | POA: Diagnosis not present

## 2021-03-07 DIAGNOSIS — I69354 Hemiplegia and hemiparesis following cerebral infarction affecting left non-dominant side: Secondary | ICD-10-CM | POA: Diagnosis not present

## 2021-03-07 DIAGNOSIS — I129 Hypertensive chronic kidney disease with stage 1 through stage 4 chronic kidney disease, or unspecified chronic kidney disease: Secondary | ICD-10-CM | POA: Diagnosis not present

## 2021-03-08 DIAGNOSIS — I129 Hypertensive chronic kidney disease with stage 1 through stage 4 chronic kidney disease, or unspecified chronic kidney disease: Secondary | ICD-10-CM | POA: Diagnosis not present

## 2021-03-08 DIAGNOSIS — E1122 Type 2 diabetes mellitus with diabetic chronic kidney disease: Secondary | ICD-10-CM | POA: Diagnosis not present

## 2021-03-08 DIAGNOSIS — I69354 Hemiplegia and hemiparesis following cerebral infarction affecting left non-dominant side: Secondary | ICD-10-CM | POA: Diagnosis not present

## 2021-03-08 DIAGNOSIS — N183 Chronic kidney disease, stage 3 unspecified: Secondary | ICD-10-CM | POA: Diagnosis not present

## 2021-03-08 DIAGNOSIS — I2510 Atherosclerotic heart disease of native coronary artery without angina pectoris: Secondary | ICD-10-CM | POA: Diagnosis not present

## 2021-03-08 DIAGNOSIS — I672 Cerebral atherosclerosis: Secondary | ICD-10-CM | POA: Diagnosis not present

## 2021-03-10 DIAGNOSIS — I672 Cerebral atherosclerosis: Secondary | ICD-10-CM | POA: Diagnosis not present

## 2021-03-10 DIAGNOSIS — I129 Hypertensive chronic kidney disease with stage 1 through stage 4 chronic kidney disease, or unspecified chronic kidney disease: Secondary | ICD-10-CM | POA: Diagnosis not present

## 2021-03-10 DIAGNOSIS — I69354 Hemiplegia and hemiparesis following cerebral infarction affecting left non-dominant side: Secondary | ICD-10-CM | POA: Diagnosis not present

## 2021-03-10 DIAGNOSIS — N183 Chronic kidney disease, stage 3 unspecified: Secondary | ICD-10-CM | POA: Diagnosis not present

## 2021-03-10 DIAGNOSIS — I2510 Atherosclerotic heart disease of native coronary artery without angina pectoris: Secondary | ICD-10-CM | POA: Diagnosis not present

## 2021-03-10 DIAGNOSIS — E1122 Type 2 diabetes mellitus with diabetic chronic kidney disease: Secondary | ICD-10-CM | POA: Diagnosis not present

## 2021-03-12 DIAGNOSIS — E1122 Type 2 diabetes mellitus with diabetic chronic kidney disease: Secondary | ICD-10-CM | POA: Diagnosis not present

## 2021-03-12 DIAGNOSIS — I69354 Hemiplegia and hemiparesis following cerebral infarction affecting left non-dominant side: Secondary | ICD-10-CM | POA: Diagnosis not present

## 2021-03-12 DIAGNOSIS — I672 Cerebral atherosclerosis: Secondary | ICD-10-CM | POA: Diagnosis not present

## 2021-03-12 DIAGNOSIS — N183 Chronic kidney disease, stage 3 unspecified: Secondary | ICD-10-CM | POA: Diagnosis not present

## 2021-03-12 DIAGNOSIS — I129 Hypertensive chronic kidney disease with stage 1 through stage 4 chronic kidney disease, or unspecified chronic kidney disease: Secondary | ICD-10-CM | POA: Diagnosis not present

## 2021-03-12 DIAGNOSIS — I2510 Atherosclerotic heart disease of native coronary artery without angina pectoris: Secondary | ICD-10-CM | POA: Diagnosis not present

## 2021-03-13 DIAGNOSIS — I672 Cerebral atherosclerosis: Secondary | ICD-10-CM | POA: Diagnosis not present

## 2021-03-13 DIAGNOSIS — I129 Hypertensive chronic kidney disease with stage 1 through stage 4 chronic kidney disease, or unspecified chronic kidney disease: Secondary | ICD-10-CM | POA: Diagnosis not present

## 2021-03-13 DIAGNOSIS — E1122 Type 2 diabetes mellitus with diabetic chronic kidney disease: Secondary | ICD-10-CM | POA: Diagnosis not present

## 2021-03-13 DIAGNOSIS — I2510 Atherosclerotic heart disease of native coronary artery without angina pectoris: Secondary | ICD-10-CM | POA: Diagnosis not present

## 2021-03-13 DIAGNOSIS — I69354 Hemiplegia and hemiparesis following cerebral infarction affecting left non-dominant side: Secondary | ICD-10-CM | POA: Diagnosis not present

## 2021-03-13 DIAGNOSIS — N183 Chronic kidney disease, stage 3 unspecified: Secondary | ICD-10-CM | POA: Diagnosis not present

## 2021-03-14 DIAGNOSIS — E1122 Type 2 diabetes mellitus with diabetic chronic kidney disease: Secondary | ICD-10-CM | POA: Diagnosis not present

## 2021-03-14 DIAGNOSIS — I672 Cerebral atherosclerosis: Secondary | ICD-10-CM | POA: Diagnosis not present

## 2021-03-14 DIAGNOSIS — I69354 Hemiplegia and hemiparesis following cerebral infarction affecting left non-dominant side: Secondary | ICD-10-CM | POA: Diagnosis not present

## 2021-03-14 DIAGNOSIS — I2510 Atherosclerotic heart disease of native coronary artery without angina pectoris: Secondary | ICD-10-CM | POA: Diagnosis not present

## 2021-03-14 DIAGNOSIS — N183 Chronic kidney disease, stage 3 unspecified: Secondary | ICD-10-CM | POA: Diagnosis not present

## 2021-03-14 DIAGNOSIS — I129 Hypertensive chronic kidney disease with stage 1 through stage 4 chronic kidney disease, or unspecified chronic kidney disease: Secondary | ICD-10-CM | POA: Diagnosis not present

## 2021-03-15 DIAGNOSIS — N183 Chronic kidney disease, stage 3 unspecified: Secondary | ICD-10-CM | POA: Diagnosis not present

## 2021-03-15 DIAGNOSIS — I129 Hypertensive chronic kidney disease with stage 1 through stage 4 chronic kidney disease, or unspecified chronic kidney disease: Secondary | ICD-10-CM | POA: Diagnosis not present

## 2021-03-15 DIAGNOSIS — I69354 Hemiplegia and hemiparesis following cerebral infarction affecting left non-dominant side: Secondary | ICD-10-CM | POA: Diagnosis not present

## 2021-03-15 DIAGNOSIS — I672 Cerebral atherosclerosis: Secondary | ICD-10-CM | POA: Diagnosis not present

## 2021-03-15 DIAGNOSIS — E1122 Type 2 diabetes mellitus with diabetic chronic kidney disease: Secondary | ICD-10-CM | POA: Diagnosis not present

## 2021-03-15 DIAGNOSIS — I2510 Atherosclerotic heart disease of native coronary artery without angina pectoris: Secondary | ICD-10-CM | POA: Diagnosis not present

## 2021-03-17 DIAGNOSIS — E1122 Type 2 diabetes mellitus with diabetic chronic kidney disease: Secondary | ICD-10-CM | POA: Diagnosis not present

## 2021-03-17 DIAGNOSIS — I672 Cerebral atherosclerosis: Secondary | ICD-10-CM | POA: Diagnosis not present

## 2021-03-17 DIAGNOSIS — I2510 Atherosclerotic heart disease of native coronary artery without angina pectoris: Secondary | ICD-10-CM | POA: Diagnosis not present

## 2021-03-17 DIAGNOSIS — I69354 Hemiplegia and hemiparesis following cerebral infarction affecting left non-dominant side: Secondary | ICD-10-CM | POA: Diagnosis not present

## 2021-03-17 DIAGNOSIS — N183 Chronic kidney disease, stage 3 unspecified: Secondary | ICD-10-CM | POA: Diagnosis not present

## 2021-03-17 DIAGNOSIS — I129 Hypertensive chronic kidney disease with stage 1 through stage 4 chronic kidney disease, or unspecified chronic kidney disease: Secondary | ICD-10-CM | POA: Diagnosis not present

## 2021-03-20 DIAGNOSIS — I2510 Atherosclerotic heart disease of native coronary artery without angina pectoris: Secondary | ICD-10-CM | POA: Diagnosis not present

## 2021-03-20 DIAGNOSIS — N183 Chronic kidney disease, stage 3 unspecified: Secondary | ICD-10-CM | POA: Diagnosis not present

## 2021-03-20 DIAGNOSIS — I672 Cerebral atherosclerosis: Secondary | ICD-10-CM | POA: Diagnosis not present

## 2021-03-20 DIAGNOSIS — I129 Hypertensive chronic kidney disease with stage 1 through stage 4 chronic kidney disease, or unspecified chronic kidney disease: Secondary | ICD-10-CM | POA: Diagnosis not present

## 2021-03-20 DIAGNOSIS — I69354 Hemiplegia and hemiparesis following cerebral infarction affecting left non-dominant side: Secondary | ICD-10-CM | POA: Diagnosis not present

## 2021-03-20 DIAGNOSIS — E1122 Type 2 diabetes mellitus with diabetic chronic kidney disease: Secondary | ICD-10-CM | POA: Diagnosis not present

## 2021-03-21 DIAGNOSIS — N183 Chronic kidney disease, stage 3 unspecified: Secondary | ICD-10-CM | POA: Diagnosis not present

## 2021-03-21 DIAGNOSIS — I672 Cerebral atherosclerosis: Secondary | ICD-10-CM | POA: Diagnosis not present

## 2021-03-21 DIAGNOSIS — E1122 Type 2 diabetes mellitus with diabetic chronic kidney disease: Secondary | ICD-10-CM | POA: Diagnosis not present

## 2021-03-21 DIAGNOSIS — I69354 Hemiplegia and hemiparesis following cerebral infarction affecting left non-dominant side: Secondary | ICD-10-CM | POA: Diagnosis not present

## 2021-03-21 DIAGNOSIS — I129 Hypertensive chronic kidney disease with stage 1 through stage 4 chronic kidney disease, or unspecified chronic kidney disease: Secondary | ICD-10-CM | POA: Diagnosis not present

## 2021-03-21 DIAGNOSIS — I2510 Atherosclerotic heart disease of native coronary artery without angina pectoris: Secondary | ICD-10-CM | POA: Diagnosis not present

## 2021-03-22 DIAGNOSIS — I2510 Atherosclerotic heart disease of native coronary artery without angina pectoris: Secondary | ICD-10-CM | POA: Diagnosis not present

## 2021-03-22 DIAGNOSIS — E1122 Type 2 diabetes mellitus with diabetic chronic kidney disease: Secondary | ICD-10-CM | POA: Diagnosis not present

## 2021-03-22 DIAGNOSIS — I69354 Hemiplegia and hemiparesis following cerebral infarction affecting left non-dominant side: Secondary | ICD-10-CM | POA: Diagnosis not present

## 2021-03-22 DIAGNOSIS — N183 Chronic kidney disease, stage 3 unspecified: Secondary | ICD-10-CM | POA: Diagnosis not present

## 2021-03-22 DIAGNOSIS — I129 Hypertensive chronic kidney disease with stage 1 through stage 4 chronic kidney disease, or unspecified chronic kidney disease: Secondary | ICD-10-CM | POA: Diagnosis not present

## 2021-03-22 DIAGNOSIS — I672 Cerebral atherosclerosis: Secondary | ICD-10-CM | POA: Diagnosis not present

## 2021-03-24 DIAGNOSIS — I672 Cerebral atherosclerosis: Secondary | ICD-10-CM | POA: Diagnosis not present

## 2021-03-24 DIAGNOSIS — N183 Chronic kidney disease, stage 3 unspecified: Secondary | ICD-10-CM | POA: Diagnosis not present

## 2021-03-24 DIAGNOSIS — I2510 Atherosclerotic heart disease of native coronary artery without angina pectoris: Secondary | ICD-10-CM | POA: Diagnosis not present

## 2021-03-24 DIAGNOSIS — E1122 Type 2 diabetes mellitus with diabetic chronic kidney disease: Secondary | ICD-10-CM | POA: Diagnosis not present

## 2021-03-24 DIAGNOSIS — I129 Hypertensive chronic kidney disease with stage 1 through stage 4 chronic kidney disease, or unspecified chronic kidney disease: Secondary | ICD-10-CM | POA: Diagnosis not present

## 2021-03-24 DIAGNOSIS — I69354 Hemiplegia and hemiparesis following cerebral infarction affecting left non-dominant side: Secondary | ICD-10-CM | POA: Diagnosis not present

## 2021-03-27 DIAGNOSIS — I129 Hypertensive chronic kidney disease with stage 1 through stage 4 chronic kidney disease, or unspecified chronic kidney disease: Secondary | ICD-10-CM | POA: Diagnosis not present

## 2021-03-27 DIAGNOSIS — I2510 Atherosclerotic heart disease of native coronary artery without angina pectoris: Secondary | ICD-10-CM | POA: Diagnosis not present

## 2021-03-27 DIAGNOSIS — I672 Cerebral atherosclerosis: Secondary | ICD-10-CM | POA: Diagnosis not present

## 2021-03-27 DIAGNOSIS — I69354 Hemiplegia and hemiparesis following cerebral infarction affecting left non-dominant side: Secondary | ICD-10-CM | POA: Diagnosis not present

## 2021-03-27 DIAGNOSIS — N183 Chronic kidney disease, stage 3 unspecified: Secondary | ICD-10-CM | POA: Diagnosis not present

## 2021-03-27 DIAGNOSIS — E1122 Type 2 diabetes mellitus with diabetic chronic kidney disease: Secondary | ICD-10-CM | POA: Diagnosis not present

## 2021-03-29 DIAGNOSIS — E1122 Type 2 diabetes mellitus with diabetic chronic kidney disease: Secondary | ICD-10-CM | POA: Diagnosis not present

## 2021-03-29 DIAGNOSIS — I129 Hypertensive chronic kidney disease with stage 1 through stage 4 chronic kidney disease, or unspecified chronic kidney disease: Secondary | ICD-10-CM | POA: Diagnosis not present

## 2021-03-29 DIAGNOSIS — I69354 Hemiplegia and hemiparesis following cerebral infarction affecting left non-dominant side: Secondary | ICD-10-CM | POA: Diagnosis not present

## 2021-03-29 DIAGNOSIS — I2510 Atherosclerotic heart disease of native coronary artery without angina pectoris: Secondary | ICD-10-CM | POA: Diagnosis not present

## 2021-03-29 DIAGNOSIS — I672 Cerebral atherosclerosis: Secondary | ICD-10-CM | POA: Diagnosis not present

## 2021-03-29 DIAGNOSIS — N183 Chronic kidney disease, stage 3 unspecified: Secondary | ICD-10-CM | POA: Diagnosis not present

## 2021-03-30 DIAGNOSIS — E559 Vitamin D deficiency, unspecified: Secondary | ICD-10-CM | POA: Diagnosis not present

## 2021-03-30 DIAGNOSIS — N183 Chronic kidney disease, stage 3 unspecified: Secondary | ICD-10-CM | POA: Diagnosis not present

## 2021-03-30 DIAGNOSIS — I129 Hypertensive chronic kidney disease with stage 1 through stage 4 chronic kidney disease, or unspecified chronic kidney disease: Secondary | ICD-10-CM | POA: Diagnosis not present

## 2021-03-30 DIAGNOSIS — Z853 Personal history of malignant neoplasm of breast: Secondary | ICD-10-CM | POA: Diagnosis not present

## 2021-03-30 DIAGNOSIS — G4733 Obstructive sleep apnea (adult) (pediatric): Secondary | ICD-10-CM | POA: Diagnosis not present

## 2021-03-30 DIAGNOSIS — E039 Hypothyroidism, unspecified: Secondary | ICD-10-CM | POA: Diagnosis not present

## 2021-03-30 DIAGNOSIS — F32A Depression, unspecified: Secondary | ICD-10-CM | POA: Diagnosis not present

## 2021-03-30 DIAGNOSIS — I2510 Atherosclerotic heart disease of native coronary artery without angina pectoris: Secondary | ICD-10-CM | POA: Diagnosis not present

## 2021-03-30 DIAGNOSIS — E785 Hyperlipidemia, unspecified: Secondary | ICD-10-CM | POA: Diagnosis not present

## 2021-03-30 DIAGNOSIS — G44009 Cluster headache syndrome, unspecified, not intractable: Secondary | ICD-10-CM | POA: Diagnosis not present

## 2021-03-30 DIAGNOSIS — I69354 Hemiplegia and hemiparesis following cerebral infarction affecting left non-dominant side: Secondary | ICD-10-CM | POA: Diagnosis not present

## 2021-03-30 DIAGNOSIS — I672 Cerebral atherosclerosis: Secondary | ICD-10-CM | POA: Diagnosis not present

## 2021-03-30 DIAGNOSIS — E1122 Type 2 diabetes mellitus with diabetic chronic kidney disease: Secondary | ICD-10-CM | POA: Diagnosis not present

## 2021-04-02 DIAGNOSIS — I69354 Hemiplegia and hemiparesis following cerebral infarction affecting left non-dominant side: Secondary | ICD-10-CM | POA: Diagnosis not present

## 2021-04-02 DIAGNOSIS — E1122 Type 2 diabetes mellitus with diabetic chronic kidney disease: Secondary | ICD-10-CM | POA: Diagnosis not present

## 2021-04-02 DIAGNOSIS — I672 Cerebral atherosclerosis: Secondary | ICD-10-CM | POA: Diagnosis not present

## 2021-04-02 DIAGNOSIS — I129 Hypertensive chronic kidney disease with stage 1 through stage 4 chronic kidney disease, or unspecified chronic kidney disease: Secondary | ICD-10-CM | POA: Diagnosis not present

## 2021-04-02 DIAGNOSIS — I2510 Atherosclerotic heart disease of native coronary artery without angina pectoris: Secondary | ICD-10-CM | POA: Diagnosis not present

## 2021-04-02 DIAGNOSIS — N183 Chronic kidney disease, stage 3 unspecified: Secondary | ICD-10-CM | POA: Diagnosis not present

## 2021-04-03 DIAGNOSIS — E1122 Type 2 diabetes mellitus with diabetic chronic kidney disease: Secondary | ICD-10-CM | POA: Diagnosis not present

## 2021-04-03 DIAGNOSIS — N183 Chronic kidney disease, stage 3 unspecified: Secondary | ICD-10-CM | POA: Diagnosis not present

## 2021-04-03 DIAGNOSIS — I672 Cerebral atherosclerosis: Secondary | ICD-10-CM | POA: Diagnosis not present

## 2021-04-03 DIAGNOSIS — I129 Hypertensive chronic kidney disease with stage 1 through stage 4 chronic kidney disease, or unspecified chronic kidney disease: Secondary | ICD-10-CM | POA: Diagnosis not present

## 2021-04-03 DIAGNOSIS — I69354 Hemiplegia and hemiparesis following cerebral infarction affecting left non-dominant side: Secondary | ICD-10-CM | POA: Diagnosis not present

## 2021-04-03 DIAGNOSIS — I2510 Atherosclerotic heart disease of native coronary artery without angina pectoris: Secondary | ICD-10-CM | POA: Diagnosis not present

## 2021-04-05 ENCOUNTER — Encounter: Payer: Self-pay | Admitting: Family Medicine

## 2021-04-05 DIAGNOSIS — N183 Chronic kidney disease, stage 3 unspecified: Secondary | ICD-10-CM | POA: Diagnosis not present

## 2021-04-05 DIAGNOSIS — I2510 Atherosclerotic heart disease of native coronary artery without angina pectoris: Secondary | ICD-10-CM | POA: Diagnosis not present

## 2021-04-05 DIAGNOSIS — E1122 Type 2 diabetes mellitus with diabetic chronic kidney disease: Secondary | ICD-10-CM | POA: Diagnosis not present

## 2021-04-05 DIAGNOSIS — I69354 Hemiplegia and hemiparesis following cerebral infarction affecting left non-dominant side: Secondary | ICD-10-CM | POA: Diagnosis not present

## 2021-04-05 DIAGNOSIS — I672 Cerebral atherosclerosis: Secondary | ICD-10-CM | POA: Diagnosis not present

## 2021-04-05 DIAGNOSIS — I129 Hypertensive chronic kidney disease with stage 1 through stage 4 chronic kidney disease, or unspecified chronic kidney disease: Secondary | ICD-10-CM | POA: Diagnosis not present

## 2021-04-06 DIAGNOSIS — I672 Cerebral atherosclerosis: Secondary | ICD-10-CM | POA: Diagnosis not present

## 2021-04-06 DIAGNOSIS — E1122 Type 2 diabetes mellitus with diabetic chronic kidney disease: Secondary | ICD-10-CM | POA: Diagnosis not present

## 2021-04-06 DIAGNOSIS — I69354 Hemiplegia and hemiparesis following cerebral infarction affecting left non-dominant side: Secondary | ICD-10-CM | POA: Diagnosis not present

## 2021-04-06 DIAGNOSIS — I2510 Atherosclerotic heart disease of native coronary artery without angina pectoris: Secondary | ICD-10-CM | POA: Diagnosis not present

## 2021-04-06 DIAGNOSIS — I129 Hypertensive chronic kidney disease with stage 1 through stage 4 chronic kidney disease, or unspecified chronic kidney disease: Secondary | ICD-10-CM | POA: Diagnosis not present

## 2021-04-06 DIAGNOSIS — N183 Chronic kidney disease, stage 3 unspecified: Secondary | ICD-10-CM | POA: Diagnosis not present

## 2021-04-07 DIAGNOSIS — I672 Cerebral atherosclerosis: Secondary | ICD-10-CM | POA: Diagnosis not present

## 2021-04-07 DIAGNOSIS — I69354 Hemiplegia and hemiparesis following cerebral infarction affecting left non-dominant side: Secondary | ICD-10-CM | POA: Diagnosis not present

## 2021-04-07 DIAGNOSIS — E1122 Type 2 diabetes mellitus with diabetic chronic kidney disease: Secondary | ICD-10-CM | POA: Diagnosis not present

## 2021-04-07 DIAGNOSIS — N183 Chronic kidney disease, stage 3 unspecified: Secondary | ICD-10-CM | POA: Diagnosis not present

## 2021-04-07 DIAGNOSIS — I129 Hypertensive chronic kidney disease with stage 1 through stage 4 chronic kidney disease, or unspecified chronic kidney disease: Secondary | ICD-10-CM | POA: Diagnosis not present

## 2021-04-07 DIAGNOSIS — I2510 Atherosclerotic heart disease of native coronary artery without angina pectoris: Secondary | ICD-10-CM | POA: Diagnosis not present

## 2021-04-08 DIAGNOSIS — I672 Cerebral atherosclerosis: Secondary | ICD-10-CM | POA: Diagnosis not present

## 2021-04-08 DIAGNOSIS — I69354 Hemiplegia and hemiparesis following cerebral infarction affecting left non-dominant side: Secondary | ICD-10-CM | POA: Diagnosis not present

## 2021-04-08 DIAGNOSIS — I2510 Atherosclerotic heart disease of native coronary artery without angina pectoris: Secondary | ICD-10-CM | POA: Diagnosis not present

## 2021-04-08 DIAGNOSIS — N183 Chronic kidney disease, stage 3 unspecified: Secondary | ICD-10-CM | POA: Diagnosis not present

## 2021-04-08 DIAGNOSIS — E1122 Type 2 diabetes mellitus with diabetic chronic kidney disease: Secondary | ICD-10-CM | POA: Diagnosis not present

## 2021-04-08 DIAGNOSIS — I129 Hypertensive chronic kidney disease with stage 1 through stage 4 chronic kidney disease, or unspecified chronic kidney disease: Secondary | ICD-10-CM | POA: Diagnosis not present

## 2021-04-09 DIAGNOSIS — E1122 Type 2 diabetes mellitus with diabetic chronic kidney disease: Secondary | ICD-10-CM | POA: Diagnosis not present

## 2021-04-09 DIAGNOSIS — I69354 Hemiplegia and hemiparesis following cerebral infarction affecting left non-dominant side: Secondary | ICD-10-CM | POA: Diagnosis not present

## 2021-04-09 DIAGNOSIS — I672 Cerebral atherosclerosis: Secondary | ICD-10-CM | POA: Diagnosis not present

## 2021-04-09 DIAGNOSIS — I129 Hypertensive chronic kidney disease with stage 1 through stage 4 chronic kidney disease, or unspecified chronic kidney disease: Secondary | ICD-10-CM | POA: Diagnosis not present

## 2021-04-09 DIAGNOSIS — N183 Chronic kidney disease, stage 3 unspecified: Secondary | ICD-10-CM | POA: Diagnosis not present

## 2021-04-09 DIAGNOSIS — I2510 Atherosclerotic heart disease of native coronary artery without angina pectoris: Secondary | ICD-10-CM | POA: Diagnosis not present

## 2021-04-10 DIAGNOSIS — N183 Chronic kidney disease, stage 3 unspecified: Secondary | ICD-10-CM | POA: Diagnosis not present

## 2021-04-10 DIAGNOSIS — E1122 Type 2 diabetes mellitus with diabetic chronic kidney disease: Secondary | ICD-10-CM | POA: Diagnosis not present

## 2021-04-10 DIAGNOSIS — I672 Cerebral atherosclerosis: Secondary | ICD-10-CM | POA: Diagnosis not present

## 2021-04-10 DIAGNOSIS — I69354 Hemiplegia and hemiparesis following cerebral infarction affecting left non-dominant side: Secondary | ICD-10-CM | POA: Diagnosis not present

## 2021-04-10 DIAGNOSIS — I129 Hypertensive chronic kidney disease with stage 1 through stage 4 chronic kidney disease, or unspecified chronic kidney disease: Secondary | ICD-10-CM | POA: Diagnosis not present

## 2021-04-10 DIAGNOSIS — I2510 Atherosclerotic heart disease of native coronary artery without angina pectoris: Secondary | ICD-10-CM | POA: Diagnosis not present

## 2021-04-11 DIAGNOSIS — I129 Hypertensive chronic kidney disease with stage 1 through stage 4 chronic kidney disease, or unspecified chronic kidney disease: Secondary | ICD-10-CM | POA: Diagnosis not present

## 2021-04-11 DIAGNOSIS — E1122 Type 2 diabetes mellitus with diabetic chronic kidney disease: Secondary | ICD-10-CM | POA: Diagnosis not present

## 2021-04-11 DIAGNOSIS — I2510 Atherosclerotic heart disease of native coronary artery without angina pectoris: Secondary | ICD-10-CM | POA: Diagnosis not present

## 2021-04-11 DIAGNOSIS — N183 Chronic kidney disease, stage 3 unspecified: Secondary | ICD-10-CM | POA: Diagnosis not present

## 2021-04-11 DIAGNOSIS — I672 Cerebral atherosclerosis: Secondary | ICD-10-CM | POA: Diagnosis not present

## 2021-04-11 DIAGNOSIS — I69354 Hemiplegia and hemiparesis following cerebral infarction affecting left non-dominant side: Secondary | ICD-10-CM | POA: Diagnosis not present

## 2021-04-12 ENCOUNTER — Ambulatory Visit: Payer: Medicare Other | Admitting: Family Medicine

## 2021-04-12 DIAGNOSIS — I69354 Hemiplegia and hemiparesis following cerebral infarction affecting left non-dominant side: Secondary | ICD-10-CM | POA: Diagnosis not present

## 2021-04-12 DIAGNOSIS — I2510 Atherosclerotic heart disease of native coronary artery without angina pectoris: Secondary | ICD-10-CM | POA: Diagnosis not present

## 2021-04-12 DIAGNOSIS — E1122 Type 2 diabetes mellitus with diabetic chronic kidney disease: Secondary | ICD-10-CM | POA: Diagnosis not present

## 2021-04-12 DIAGNOSIS — N183 Chronic kidney disease, stage 3 unspecified: Secondary | ICD-10-CM | POA: Diagnosis not present

## 2021-04-12 DIAGNOSIS — I672 Cerebral atherosclerosis: Secondary | ICD-10-CM | POA: Diagnosis not present

## 2021-04-12 DIAGNOSIS — I129 Hypertensive chronic kidney disease with stage 1 through stage 4 chronic kidney disease, or unspecified chronic kidney disease: Secondary | ICD-10-CM | POA: Diagnosis not present

## 2021-04-12 NOTE — Telephone Encounter (Signed)
Lvm asking pt to call back.  Dr. Darnell Level wanted to see if pt still needs video visit or is she on her way to [facility] office.    Also, tried cell # on file.  Line was picked up, then immediately disconnected.

## 2021-04-14 DIAGNOSIS — N183 Chronic kidney disease, stage 3 unspecified: Secondary | ICD-10-CM | POA: Diagnosis not present

## 2021-04-14 DIAGNOSIS — I2510 Atherosclerotic heart disease of native coronary artery without angina pectoris: Secondary | ICD-10-CM | POA: Diagnosis not present

## 2021-04-14 DIAGNOSIS — I69354 Hemiplegia and hemiparesis following cerebral infarction affecting left non-dominant side: Secondary | ICD-10-CM | POA: Diagnosis not present

## 2021-04-14 DIAGNOSIS — I672 Cerebral atherosclerosis: Secondary | ICD-10-CM | POA: Diagnosis not present

## 2021-04-14 DIAGNOSIS — E1122 Type 2 diabetes mellitus with diabetic chronic kidney disease: Secondary | ICD-10-CM | POA: Diagnosis not present

## 2021-04-14 DIAGNOSIS — I129 Hypertensive chronic kidney disease with stage 1 through stage 4 chronic kidney disease, or unspecified chronic kidney disease: Secondary | ICD-10-CM | POA: Diagnosis not present

## 2021-04-15 DIAGNOSIS — I129 Hypertensive chronic kidney disease with stage 1 through stage 4 chronic kidney disease, or unspecified chronic kidney disease: Secondary | ICD-10-CM | POA: Diagnosis not present

## 2021-04-15 DIAGNOSIS — I69354 Hemiplegia and hemiparesis following cerebral infarction affecting left non-dominant side: Secondary | ICD-10-CM | POA: Diagnosis not present

## 2021-04-15 DIAGNOSIS — I672 Cerebral atherosclerosis: Secondary | ICD-10-CM | POA: Diagnosis not present

## 2021-04-15 DIAGNOSIS — N183 Chronic kidney disease, stage 3 unspecified: Secondary | ICD-10-CM | POA: Diagnosis not present

## 2021-04-15 DIAGNOSIS — I2510 Atherosclerotic heart disease of native coronary artery without angina pectoris: Secondary | ICD-10-CM | POA: Diagnosis not present

## 2021-04-15 DIAGNOSIS — E1122 Type 2 diabetes mellitus with diabetic chronic kidney disease: Secondary | ICD-10-CM | POA: Diagnosis not present

## 2021-04-17 DIAGNOSIS — E1122 Type 2 diabetes mellitus with diabetic chronic kidney disease: Secondary | ICD-10-CM | POA: Diagnosis not present

## 2021-04-17 DIAGNOSIS — I2510 Atherosclerotic heart disease of native coronary artery without angina pectoris: Secondary | ICD-10-CM | POA: Diagnosis not present

## 2021-04-17 DIAGNOSIS — N183 Chronic kidney disease, stage 3 unspecified: Secondary | ICD-10-CM | POA: Diagnosis not present

## 2021-04-17 DIAGNOSIS — I672 Cerebral atherosclerosis: Secondary | ICD-10-CM | POA: Diagnosis not present

## 2021-04-17 DIAGNOSIS — I69354 Hemiplegia and hemiparesis following cerebral infarction affecting left non-dominant side: Secondary | ICD-10-CM | POA: Diagnosis not present

## 2021-04-17 DIAGNOSIS — I129 Hypertensive chronic kidney disease with stage 1 through stage 4 chronic kidney disease, or unspecified chronic kidney disease: Secondary | ICD-10-CM | POA: Diagnosis not present

## 2021-04-18 DIAGNOSIS — N183 Chronic kidney disease, stage 3 unspecified: Secondary | ICD-10-CM | POA: Diagnosis not present

## 2021-04-18 DIAGNOSIS — I672 Cerebral atherosclerosis: Secondary | ICD-10-CM | POA: Diagnosis not present

## 2021-04-18 DIAGNOSIS — I129 Hypertensive chronic kidney disease with stage 1 through stage 4 chronic kidney disease, or unspecified chronic kidney disease: Secondary | ICD-10-CM | POA: Diagnosis not present

## 2021-04-18 DIAGNOSIS — E1122 Type 2 diabetes mellitus with diabetic chronic kidney disease: Secondary | ICD-10-CM | POA: Diagnosis not present

## 2021-04-18 DIAGNOSIS — I2510 Atherosclerotic heart disease of native coronary artery without angina pectoris: Secondary | ICD-10-CM | POA: Diagnosis not present

## 2021-04-18 DIAGNOSIS — I69354 Hemiplegia and hemiparesis following cerebral infarction affecting left non-dominant side: Secondary | ICD-10-CM | POA: Diagnosis not present

## 2021-04-19 DIAGNOSIS — N183 Chronic kidney disease, stage 3 unspecified: Secondary | ICD-10-CM | POA: Diagnosis not present

## 2021-04-19 DIAGNOSIS — I672 Cerebral atherosclerosis: Secondary | ICD-10-CM | POA: Diagnosis not present

## 2021-04-19 DIAGNOSIS — I2510 Atherosclerotic heart disease of native coronary artery without angina pectoris: Secondary | ICD-10-CM | POA: Diagnosis not present

## 2021-04-19 DIAGNOSIS — E1122 Type 2 diabetes mellitus with diabetic chronic kidney disease: Secondary | ICD-10-CM | POA: Diagnosis not present

## 2021-04-19 DIAGNOSIS — I69354 Hemiplegia and hemiparesis following cerebral infarction affecting left non-dominant side: Secondary | ICD-10-CM | POA: Diagnosis not present

## 2021-04-19 DIAGNOSIS — I129 Hypertensive chronic kidney disease with stage 1 through stage 4 chronic kidney disease, or unspecified chronic kidney disease: Secondary | ICD-10-CM | POA: Diagnosis not present

## 2021-04-21 DIAGNOSIS — I129 Hypertensive chronic kidney disease with stage 1 through stage 4 chronic kidney disease, or unspecified chronic kidney disease: Secondary | ICD-10-CM | POA: Diagnosis not present

## 2021-04-21 DIAGNOSIS — I69354 Hemiplegia and hemiparesis following cerebral infarction affecting left non-dominant side: Secondary | ICD-10-CM | POA: Diagnosis not present

## 2021-04-21 DIAGNOSIS — E1122 Type 2 diabetes mellitus with diabetic chronic kidney disease: Secondary | ICD-10-CM | POA: Diagnosis not present

## 2021-04-21 DIAGNOSIS — I672 Cerebral atherosclerosis: Secondary | ICD-10-CM | POA: Diagnosis not present

## 2021-04-21 DIAGNOSIS — I2510 Atherosclerotic heart disease of native coronary artery without angina pectoris: Secondary | ICD-10-CM | POA: Diagnosis not present

## 2021-04-21 DIAGNOSIS — N183 Chronic kidney disease, stage 3 unspecified: Secondary | ICD-10-CM | POA: Diagnosis not present

## 2021-04-24 DIAGNOSIS — I129 Hypertensive chronic kidney disease with stage 1 through stage 4 chronic kidney disease, or unspecified chronic kidney disease: Secondary | ICD-10-CM | POA: Diagnosis not present

## 2021-04-24 DIAGNOSIS — I672 Cerebral atherosclerosis: Secondary | ICD-10-CM | POA: Diagnosis not present

## 2021-04-24 DIAGNOSIS — I69354 Hemiplegia and hemiparesis following cerebral infarction affecting left non-dominant side: Secondary | ICD-10-CM | POA: Diagnosis not present

## 2021-04-24 DIAGNOSIS — I2510 Atherosclerotic heart disease of native coronary artery without angina pectoris: Secondary | ICD-10-CM | POA: Diagnosis not present

## 2021-04-24 DIAGNOSIS — N183 Chronic kidney disease, stage 3 unspecified: Secondary | ICD-10-CM | POA: Diagnosis not present

## 2021-04-24 DIAGNOSIS — E1122 Type 2 diabetes mellitus with diabetic chronic kidney disease: Secondary | ICD-10-CM | POA: Diagnosis not present

## 2021-04-25 DIAGNOSIS — I129 Hypertensive chronic kidney disease with stage 1 through stage 4 chronic kidney disease, or unspecified chronic kidney disease: Secondary | ICD-10-CM | POA: Diagnosis not present

## 2021-04-25 DIAGNOSIS — N183 Chronic kidney disease, stage 3 unspecified: Secondary | ICD-10-CM | POA: Diagnosis not present

## 2021-04-25 DIAGNOSIS — I2510 Atherosclerotic heart disease of native coronary artery without angina pectoris: Secondary | ICD-10-CM | POA: Diagnosis not present

## 2021-04-25 DIAGNOSIS — E1122 Type 2 diabetes mellitus with diabetic chronic kidney disease: Secondary | ICD-10-CM | POA: Diagnosis not present

## 2021-04-25 DIAGNOSIS — I69354 Hemiplegia and hemiparesis following cerebral infarction affecting left non-dominant side: Secondary | ICD-10-CM | POA: Diagnosis not present

## 2021-04-25 DIAGNOSIS — I672 Cerebral atherosclerosis: Secondary | ICD-10-CM | POA: Diagnosis not present

## 2021-04-26 DIAGNOSIS — I69354 Hemiplegia and hemiparesis following cerebral infarction affecting left non-dominant side: Secondary | ICD-10-CM | POA: Diagnosis not present

## 2021-04-26 DIAGNOSIS — E1122 Type 2 diabetes mellitus with diabetic chronic kidney disease: Secondary | ICD-10-CM | POA: Diagnosis not present

## 2021-04-26 DIAGNOSIS — I672 Cerebral atherosclerosis: Secondary | ICD-10-CM | POA: Diagnosis not present

## 2021-04-26 DIAGNOSIS — I2510 Atherosclerotic heart disease of native coronary artery without angina pectoris: Secondary | ICD-10-CM | POA: Diagnosis not present

## 2021-04-26 DIAGNOSIS — I129 Hypertensive chronic kidney disease with stage 1 through stage 4 chronic kidney disease, or unspecified chronic kidney disease: Secondary | ICD-10-CM | POA: Diagnosis not present

## 2021-04-26 DIAGNOSIS — N183 Chronic kidney disease, stage 3 unspecified: Secondary | ICD-10-CM | POA: Diagnosis not present

## 2021-04-28 DIAGNOSIS — I69354 Hemiplegia and hemiparesis following cerebral infarction affecting left non-dominant side: Secondary | ICD-10-CM | POA: Diagnosis not present

## 2021-04-28 DIAGNOSIS — E1122 Type 2 diabetes mellitus with diabetic chronic kidney disease: Secondary | ICD-10-CM | POA: Diagnosis not present

## 2021-04-28 DIAGNOSIS — I672 Cerebral atherosclerosis: Secondary | ICD-10-CM | POA: Diagnosis not present

## 2021-04-28 DIAGNOSIS — I2510 Atherosclerotic heart disease of native coronary artery without angina pectoris: Secondary | ICD-10-CM | POA: Diagnosis not present

## 2021-04-28 DIAGNOSIS — I129 Hypertensive chronic kidney disease with stage 1 through stage 4 chronic kidney disease, or unspecified chronic kidney disease: Secondary | ICD-10-CM | POA: Diagnosis not present

## 2021-04-28 DIAGNOSIS — N183 Chronic kidney disease, stage 3 unspecified: Secondary | ICD-10-CM | POA: Diagnosis not present

## 2021-04-30 DIAGNOSIS — G4733 Obstructive sleep apnea (adult) (pediatric): Secondary | ICD-10-CM | POA: Diagnosis not present

## 2021-04-30 DIAGNOSIS — I2510 Atherosclerotic heart disease of native coronary artery without angina pectoris: Secondary | ICD-10-CM | POA: Diagnosis not present

## 2021-04-30 DIAGNOSIS — E559 Vitamin D deficiency, unspecified: Secondary | ICD-10-CM | POA: Diagnosis not present

## 2021-04-30 DIAGNOSIS — Z853 Personal history of malignant neoplasm of breast: Secondary | ICD-10-CM | POA: Diagnosis not present

## 2021-04-30 DIAGNOSIS — E039 Hypothyroidism, unspecified: Secondary | ICD-10-CM | POA: Diagnosis not present

## 2021-04-30 DIAGNOSIS — E1122 Type 2 diabetes mellitus with diabetic chronic kidney disease: Secondary | ICD-10-CM | POA: Diagnosis not present

## 2021-04-30 DIAGNOSIS — E785 Hyperlipidemia, unspecified: Secondary | ICD-10-CM | POA: Diagnosis not present

## 2021-04-30 DIAGNOSIS — F32A Depression, unspecified: Secondary | ICD-10-CM | POA: Diagnosis not present

## 2021-04-30 DIAGNOSIS — I129 Hypertensive chronic kidney disease with stage 1 through stage 4 chronic kidney disease, or unspecified chronic kidney disease: Secondary | ICD-10-CM | POA: Diagnosis not present

## 2021-04-30 DIAGNOSIS — I69354 Hemiplegia and hemiparesis following cerebral infarction affecting left non-dominant side: Secondary | ICD-10-CM | POA: Diagnosis not present

## 2021-04-30 DIAGNOSIS — G44009 Cluster headache syndrome, unspecified, not intractable: Secondary | ICD-10-CM | POA: Diagnosis not present

## 2021-04-30 DIAGNOSIS — I672 Cerebral atherosclerosis: Secondary | ICD-10-CM | POA: Diagnosis not present

## 2021-04-30 DIAGNOSIS — N183 Chronic kidney disease, stage 3 unspecified: Secondary | ICD-10-CM | POA: Diagnosis not present

## 2021-05-01 DIAGNOSIS — N183 Chronic kidney disease, stage 3 unspecified: Secondary | ICD-10-CM | POA: Diagnosis not present

## 2021-05-01 DIAGNOSIS — E1122 Type 2 diabetes mellitus with diabetic chronic kidney disease: Secondary | ICD-10-CM | POA: Diagnosis not present

## 2021-05-01 DIAGNOSIS — I2510 Atherosclerotic heart disease of native coronary artery without angina pectoris: Secondary | ICD-10-CM | POA: Diagnosis not present

## 2021-05-01 DIAGNOSIS — I672 Cerebral atherosclerosis: Secondary | ICD-10-CM | POA: Diagnosis not present

## 2021-05-01 DIAGNOSIS — I69354 Hemiplegia and hemiparesis following cerebral infarction affecting left non-dominant side: Secondary | ICD-10-CM | POA: Diagnosis not present

## 2021-05-01 DIAGNOSIS — I129 Hypertensive chronic kidney disease with stage 1 through stage 4 chronic kidney disease, or unspecified chronic kidney disease: Secondary | ICD-10-CM | POA: Diagnosis not present

## 2021-05-02 DIAGNOSIS — I129 Hypertensive chronic kidney disease with stage 1 through stage 4 chronic kidney disease, or unspecified chronic kidney disease: Secondary | ICD-10-CM | POA: Diagnosis not present

## 2021-05-02 DIAGNOSIS — I2510 Atherosclerotic heart disease of native coronary artery without angina pectoris: Secondary | ICD-10-CM | POA: Diagnosis not present

## 2021-05-02 DIAGNOSIS — E1122 Type 2 diabetes mellitus with diabetic chronic kidney disease: Secondary | ICD-10-CM | POA: Diagnosis not present

## 2021-05-02 DIAGNOSIS — I69354 Hemiplegia and hemiparesis following cerebral infarction affecting left non-dominant side: Secondary | ICD-10-CM | POA: Diagnosis not present

## 2021-05-02 DIAGNOSIS — N183 Chronic kidney disease, stage 3 unspecified: Secondary | ICD-10-CM | POA: Diagnosis not present

## 2021-05-02 DIAGNOSIS — I672 Cerebral atherosclerosis: Secondary | ICD-10-CM | POA: Diagnosis not present

## 2021-05-03 DIAGNOSIS — I2510 Atherosclerotic heart disease of native coronary artery without angina pectoris: Secondary | ICD-10-CM | POA: Diagnosis not present

## 2021-05-03 DIAGNOSIS — N183 Chronic kidney disease, stage 3 unspecified: Secondary | ICD-10-CM | POA: Diagnosis not present

## 2021-05-03 DIAGNOSIS — I129 Hypertensive chronic kidney disease with stage 1 through stage 4 chronic kidney disease, or unspecified chronic kidney disease: Secondary | ICD-10-CM | POA: Diagnosis not present

## 2021-05-03 DIAGNOSIS — I69354 Hemiplegia and hemiparesis following cerebral infarction affecting left non-dominant side: Secondary | ICD-10-CM | POA: Diagnosis not present

## 2021-05-03 DIAGNOSIS — E1122 Type 2 diabetes mellitus with diabetic chronic kidney disease: Secondary | ICD-10-CM | POA: Diagnosis not present

## 2021-05-03 DIAGNOSIS — I672 Cerebral atherosclerosis: Secondary | ICD-10-CM | POA: Diagnosis not present

## 2021-05-05 DIAGNOSIS — I69354 Hemiplegia and hemiparesis following cerebral infarction affecting left non-dominant side: Secondary | ICD-10-CM | POA: Diagnosis not present

## 2021-05-05 DIAGNOSIS — I672 Cerebral atherosclerosis: Secondary | ICD-10-CM | POA: Diagnosis not present

## 2021-05-05 DIAGNOSIS — E1122 Type 2 diabetes mellitus with diabetic chronic kidney disease: Secondary | ICD-10-CM | POA: Diagnosis not present

## 2021-05-05 DIAGNOSIS — I2510 Atherosclerotic heart disease of native coronary artery without angina pectoris: Secondary | ICD-10-CM | POA: Diagnosis not present

## 2021-05-05 DIAGNOSIS — N183 Chronic kidney disease, stage 3 unspecified: Secondary | ICD-10-CM | POA: Diagnosis not present

## 2021-05-05 DIAGNOSIS — I129 Hypertensive chronic kidney disease with stage 1 through stage 4 chronic kidney disease, or unspecified chronic kidney disease: Secondary | ICD-10-CM | POA: Diagnosis not present

## 2021-05-07 DIAGNOSIS — I672 Cerebral atherosclerosis: Secondary | ICD-10-CM | POA: Diagnosis not present

## 2021-05-07 DIAGNOSIS — N183 Chronic kidney disease, stage 3 unspecified: Secondary | ICD-10-CM | POA: Diagnosis not present

## 2021-05-07 DIAGNOSIS — I2510 Atherosclerotic heart disease of native coronary artery without angina pectoris: Secondary | ICD-10-CM | POA: Diagnosis not present

## 2021-05-07 DIAGNOSIS — I129 Hypertensive chronic kidney disease with stage 1 through stage 4 chronic kidney disease, or unspecified chronic kidney disease: Secondary | ICD-10-CM | POA: Diagnosis not present

## 2021-05-07 DIAGNOSIS — I69354 Hemiplegia and hemiparesis following cerebral infarction affecting left non-dominant side: Secondary | ICD-10-CM | POA: Diagnosis not present

## 2021-05-07 DIAGNOSIS — E1122 Type 2 diabetes mellitus with diabetic chronic kidney disease: Secondary | ICD-10-CM | POA: Diagnosis not present

## 2021-05-08 DIAGNOSIS — I69354 Hemiplegia and hemiparesis following cerebral infarction affecting left non-dominant side: Secondary | ICD-10-CM | POA: Diagnosis not present

## 2021-05-08 DIAGNOSIS — I672 Cerebral atherosclerosis: Secondary | ICD-10-CM | POA: Diagnosis not present

## 2021-05-08 DIAGNOSIS — N183 Chronic kidney disease, stage 3 unspecified: Secondary | ICD-10-CM | POA: Diagnosis not present

## 2021-05-08 DIAGNOSIS — E1122 Type 2 diabetes mellitus with diabetic chronic kidney disease: Secondary | ICD-10-CM | POA: Diagnosis not present

## 2021-05-08 DIAGNOSIS — I129 Hypertensive chronic kidney disease with stage 1 through stage 4 chronic kidney disease, or unspecified chronic kidney disease: Secondary | ICD-10-CM | POA: Diagnosis not present

## 2021-05-08 DIAGNOSIS — I2510 Atherosclerotic heart disease of native coronary artery without angina pectoris: Secondary | ICD-10-CM | POA: Diagnosis not present

## 2021-05-10 DIAGNOSIS — I2510 Atherosclerotic heart disease of native coronary artery without angina pectoris: Secondary | ICD-10-CM | POA: Diagnosis not present

## 2021-05-10 DIAGNOSIS — E1122 Type 2 diabetes mellitus with diabetic chronic kidney disease: Secondary | ICD-10-CM | POA: Diagnosis not present

## 2021-05-10 DIAGNOSIS — I672 Cerebral atherosclerosis: Secondary | ICD-10-CM | POA: Diagnosis not present

## 2021-05-10 DIAGNOSIS — I69354 Hemiplegia and hemiparesis following cerebral infarction affecting left non-dominant side: Secondary | ICD-10-CM | POA: Diagnosis not present

## 2021-05-10 DIAGNOSIS — I129 Hypertensive chronic kidney disease with stage 1 through stage 4 chronic kidney disease, or unspecified chronic kidney disease: Secondary | ICD-10-CM | POA: Diagnosis not present

## 2021-05-10 DIAGNOSIS — N183 Chronic kidney disease, stage 3 unspecified: Secondary | ICD-10-CM | POA: Diagnosis not present

## 2021-05-12 DIAGNOSIS — I672 Cerebral atherosclerosis: Secondary | ICD-10-CM | POA: Diagnosis not present

## 2021-05-12 DIAGNOSIS — I129 Hypertensive chronic kidney disease with stage 1 through stage 4 chronic kidney disease, or unspecified chronic kidney disease: Secondary | ICD-10-CM | POA: Diagnosis not present

## 2021-05-12 DIAGNOSIS — I69354 Hemiplegia and hemiparesis following cerebral infarction affecting left non-dominant side: Secondary | ICD-10-CM | POA: Diagnosis not present

## 2021-05-12 DIAGNOSIS — N183 Chronic kidney disease, stage 3 unspecified: Secondary | ICD-10-CM | POA: Diagnosis not present

## 2021-05-12 DIAGNOSIS — E1122 Type 2 diabetes mellitus with diabetic chronic kidney disease: Secondary | ICD-10-CM | POA: Diagnosis not present

## 2021-05-12 DIAGNOSIS — I2510 Atherosclerotic heart disease of native coronary artery without angina pectoris: Secondary | ICD-10-CM | POA: Diagnosis not present

## 2021-05-15 DIAGNOSIS — I69354 Hemiplegia and hemiparesis following cerebral infarction affecting left non-dominant side: Secondary | ICD-10-CM | POA: Diagnosis not present

## 2021-05-15 DIAGNOSIS — E1122 Type 2 diabetes mellitus with diabetic chronic kidney disease: Secondary | ICD-10-CM | POA: Diagnosis not present

## 2021-05-15 DIAGNOSIS — I672 Cerebral atherosclerosis: Secondary | ICD-10-CM | POA: Diagnosis not present

## 2021-05-15 DIAGNOSIS — I2510 Atherosclerotic heart disease of native coronary artery without angina pectoris: Secondary | ICD-10-CM | POA: Diagnosis not present

## 2021-05-15 DIAGNOSIS — N183 Chronic kidney disease, stage 3 unspecified: Secondary | ICD-10-CM | POA: Diagnosis not present

## 2021-05-15 DIAGNOSIS — I129 Hypertensive chronic kidney disease with stage 1 through stage 4 chronic kidney disease, or unspecified chronic kidney disease: Secondary | ICD-10-CM | POA: Diagnosis not present

## 2021-05-16 DIAGNOSIS — E1122 Type 2 diabetes mellitus with diabetic chronic kidney disease: Secondary | ICD-10-CM | POA: Diagnosis not present

## 2021-05-16 DIAGNOSIS — I672 Cerebral atherosclerosis: Secondary | ICD-10-CM | POA: Diagnosis not present

## 2021-05-16 DIAGNOSIS — I129 Hypertensive chronic kidney disease with stage 1 through stage 4 chronic kidney disease, or unspecified chronic kidney disease: Secondary | ICD-10-CM | POA: Diagnosis not present

## 2021-05-16 DIAGNOSIS — I69354 Hemiplegia and hemiparesis following cerebral infarction affecting left non-dominant side: Secondary | ICD-10-CM | POA: Diagnosis not present

## 2021-05-16 DIAGNOSIS — N183 Chronic kidney disease, stage 3 unspecified: Secondary | ICD-10-CM | POA: Diagnosis not present

## 2021-05-16 DIAGNOSIS — I2510 Atherosclerotic heart disease of native coronary artery without angina pectoris: Secondary | ICD-10-CM | POA: Diagnosis not present

## 2021-05-17 DIAGNOSIS — N183 Chronic kidney disease, stage 3 unspecified: Secondary | ICD-10-CM | POA: Diagnosis not present

## 2021-05-17 DIAGNOSIS — I69354 Hemiplegia and hemiparesis following cerebral infarction affecting left non-dominant side: Secondary | ICD-10-CM | POA: Diagnosis not present

## 2021-05-17 DIAGNOSIS — I2510 Atherosclerotic heart disease of native coronary artery without angina pectoris: Secondary | ICD-10-CM | POA: Diagnosis not present

## 2021-05-17 DIAGNOSIS — I129 Hypertensive chronic kidney disease with stage 1 through stage 4 chronic kidney disease, or unspecified chronic kidney disease: Secondary | ICD-10-CM | POA: Diagnosis not present

## 2021-05-17 DIAGNOSIS — I672 Cerebral atherosclerosis: Secondary | ICD-10-CM | POA: Diagnosis not present

## 2021-05-17 DIAGNOSIS — E1122 Type 2 diabetes mellitus with diabetic chronic kidney disease: Secondary | ICD-10-CM | POA: Diagnosis not present

## 2021-05-19 DIAGNOSIS — N183 Chronic kidney disease, stage 3 unspecified: Secondary | ICD-10-CM | POA: Diagnosis not present

## 2021-05-19 DIAGNOSIS — E1122 Type 2 diabetes mellitus with diabetic chronic kidney disease: Secondary | ICD-10-CM | POA: Diagnosis not present

## 2021-05-19 DIAGNOSIS — I69354 Hemiplegia and hemiparesis following cerebral infarction affecting left non-dominant side: Secondary | ICD-10-CM | POA: Diagnosis not present

## 2021-05-19 DIAGNOSIS — I2510 Atherosclerotic heart disease of native coronary artery without angina pectoris: Secondary | ICD-10-CM | POA: Diagnosis not present

## 2021-05-19 DIAGNOSIS — I672 Cerebral atherosclerosis: Secondary | ICD-10-CM | POA: Diagnosis not present

## 2021-05-19 DIAGNOSIS — I129 Hypertensive chronic kidney disease with stage 1 through stage 4 chronic kidney disease, or unspecified chronic kidney disease: Secondary | ICD-10-CM | POA: Diagnosis not present

## 2021-05-20 ENCOUNTER — Other Ambulatory Visit: Payer: Self-pay | Admitting: Cardiovascular Disease

## 2021-05-20 NOTE — Telephone Encounter (Signed)
Please schedule 6 month F/U appointment. Thank you! 

## 2021-05-21 NOTE — Telephone Encounter (Signed)
LVM to schedule appt

## 2021-05-22 DIAGNOSIS — N183 Chronic kidney disease, stage 3 unspecified: Secondary | ICD-10-CM | POA: Diagnosis not present

## 2021-05-22 DIAGNOSIS — I69354 Hemiplegia and hemiparesis following cerebral infarction affecting left non-dominant side: Secondary | ICD-10-CM | POA: Diagnosis not present

## 2021-05-22 DIAGNOSIS — I129 Hypertensive chronic kidney disease with stage 1 through stage 4 chronic kidney disease, or unspecified chronic kidney disease: Secondary | ICD-10-CM | POA: Diagnosis not present

## 2021-05-22 DIAGNOSIS — I2510 Atherosclerotic heart disease of native coronary artery without angina pectoris: Secondary | ICD-10-CM | POA: Diagnosis not present

## 2021-05-22 DIAGNOSIS — E1122 Type 2 diabetes mellitus with diabetic chronic kidney disease: Secondary | ICD-10-CM | POA: Diagnosis not present

## 2021-05-22 DIAGNOSIS — I672 Cerebral atherosclerosis: Secondary | ICD-10-CM | POA: Diagnosis not present

## 2021-05-24 DIAGNOSIS — N183 Chronic kidney disease, stage 3 unspecified: Secondary | ICD-10-CM | POA: Diagnosis not present

## 2021-05-24 DIAGNOSIS — I129 Hypertensive chronic kidney disease with stage 1 through stage 4 chronic kidney disease, or unspecified chronic kidney disease: Secondary | ICD-10-CM | POA: Diagnosis not present

## 2021-05-24 DIAGNOSIS — E1122 Type 2 diabetes mellitus with diabetic chronic kidney disease: Secondary | ICD-10-CM | POA: Diagnosis not present

## 2021-05-24 DIAGNOSIS — I672 Cerebral atherosclerosis: Secondary | ICD-10-CM | POA: Diagnosis not present

## 2021-05-24 DIAGNOSIS — I2510 Atherosclerotic heart disease of native coronary artery without angina pectoris: Secondary | ICD-10-CM | POA: Diagnosis not present

## 2021-05-24 DIAGNOSIS — I69354 Hemiplegia and hemiparesis following cerebral infarction affecting left non-dominant side: Secondary | ICD-10-CM | POA: Diagnosis not present

## 2021-05-26 DIAGNOSIS — I672 Cerebral atherosclerosis: Secondary | ICD-10-CM | POA: Diagnosis not present

## 2021-05-26 DIAGNOSIS — I129 Hypertensive chronic kidney disease with stage 1 through stage 4 chronic kidney disease, or unspecified chronic kidney disease: Secondary | ICD-10-CM | POA: Diagnosis not present

## 2021-05-26 DIAGNOSIS — N183 Chronic kidney disease, stage 3 unspecified: Secondary | ICD-10-CM | POA: Diagnosis not present

## 2021-05-26 DIAGNOSIS — I2510 Atherosclerotic heart disease of native coronary artery without angina pectoris: Secondary | ICD-10-CM | POA: Diagnosis not present

## 2021-05-26 DIAGNOSIS — E1122 Type 2 diabetes mellitus with diabetic chronic kidney disease: Secondary | ICD-10-CM | POA: Diagnosis not present

## 2021-05-26 DIAGNOSIS — I69354 Hemiplegia and hemiparesis following cerebral infarction affecting left non-dominant side: Secondary | ICD-10-CM | POA: Diagnosis not present

## 2021-05-28 DIAGNOSIS — N183 Chronic kidney disease, stage 3 unspecified: Secondary | ICD-10-CM | POA: Diagnosis not present

## 2021-05-28 DIAGNOSIS — E1122 Type 2 diabetes mellitus with diabetic chronic kidney disease: Secondary | ICD-10-CM | POA: Diagnosis not present

## 2021-05-28 DIAGNOSIS — I2510 Atherosclerotic heart disease of native coronary artery without angina pectoris: Secondary | ICD-10-CM | POA: Diagnosis not present

## 2021-05-28 DIAGNOSIS — I672 Cerebral atherosclerosis: Secondary | ICD-10-CM | POA: Diagnosis not present

## 2021-05-28 DIAGNOSIS — I69354 Hemiplegia and hemiparesis following cerebral infarction affecting left non-dominant side: Secondary | ICD-10-CM | POA: Diagnosis not present

## 2021-05-28 DIAGNOSIS — I129 Hypertensive chronic kidney disease with stage 1 through stage 4 chronic kidney disease, or unspecified chronic kidney disease: Secondary | ICD-10-CM | POA: Diagnosis not present

## 2021-05-29 DIAGNOSIS — I69354 Hemiplegia and hemiparesis following cerebral infarction affecting left non-dominant side: Secondary | ICD-10-CM | POA: Diagnosis not present

## 2021-05-29 DIAGNOSIS — N183 Chronic kidney disease, stage 3 unspecified: Secondary | ICD-10-CM | POA: Diagnosis not present

## 2021-05-29 DIAGNOSIS — I2510 Atherosclerotic heart disease of native coronary artery without angina pectoris: Secondary | ICD-10-CM | POA: Diagnosis not present

## 2021-05-29 DIAGNOSIS — E1122 Type 2 diabetes mellitus with diabetic chronic kidney disease: Secondary | ICD-10-CM | POA: Diagnosis not present

## 2021-05-29 DIAGNOSIS — I672 Cerebral atherosclerosis: Secondary | ICD-10-CM | POA: Diagnosis not present

## 2021-05-29 DIAGNOSIS — I129 Hypertensive chronic kidney disease with stage 1 through stage 4 chronic kidney disease, or unspecified chronic kidney disease: Secondary | ICD-10-CM | POA: Diagnosis not present

## 2021-05-30 DIAGNOSIS — F32A Depression, unspecified: Secondary | ICD-10-CM | POA: Diagnosis not present

## 2021-05-30 DIAGNOSIS — E559 Vitamin D deficiency, unspecified: Secondary | ICD-10-CM | POA: Diagnosis not present

## 2021-05-30 DIAGNOSIS — I129 Hypertensive chronic kidney disease with stage 1 through stage 4 chronic kidney disease, or unspecified chronic kidney disease: Secondary | ICD-10-CM | POA: Diagnosis not present

## 2021-05-30 DIAGNOSIS — I2510 Atherosclerotic heart disease of native coronary artery without angina pectoris: Secondary | ICD-10-CM | POA: Diagnosis not present

## 2021-05-30 DIAGNOSIS — N183 Chronic kidney disease, stage 3 unspecified: Secondary | ICD-10-CM | POA: Diagnosis not present

## 2021-05-30 DIAGNOSIS — E1122 Type 2 diabetes mellitus with diabetic chronic kidney disease: Secondary | ICD-10-CM | POA: Diagnosis not present

## 2021-05-30 DIAGNOSIS — E785 Hyperlipidemia, unspecified: Secondary | ICD-10-CM | POA: Diagnosis not present

## 2021-05-30 DIAGNOSIS — I672 Cerebral atherosclerosis: Secondary | ICD-10-CM | POA: Diagnosis not present

## 2021-05-30 DIAGNOSIS — Z853 Personal history of malignant neoplasm of breast: Secondary | ICD-10-CM | POA: Diagnosis not present

## 2021-05-30 DIAGNOSIS — E039 Hypothyroidism, unspecified: Secondary | ICD-10-CM | POA: Diagnosis not present

## 2021-05-30 DIAGNOSIS — I69354 Hemiplegia and hemiparesis following cerebral infarction affecting left non-dominant side: Secondary | ICD-10-CM | POA: Diagnosis not present

## 2021-05-30 DIAGNOSIS — G4733 Obstructive sleep apnea (adult) (pediatric): Secondary | ICD-10-CM | POA: Diagnosis not present

## 2021-05-30 DIAGNOSIS — G44009 Cluster headache syndrome, unspecified, not intractable: Secondary | ICD-10-CM | POA: Diagnosis not present

## 2021-05-31 DIAGNOSIS — E1122 Type 2 diabetes mellitus with diabetic chronic kidney disease: Secondary | ICD-10-CM | POA: Diagnosis not present

## 2021-05-31 DIAGNOSIS — I2510 Atherosclerotic heart disease of native coronary artery without angina pectoris: Secondary | ICD-10-CM | POA: Diagnosis not present

## 2021-05-31 DIAGNOSIS — I129 Hypertensive chronic kidney disease with stage 1 through stage 4 chronic kidney disease, or unspecified chronic kidney disease: Secondary | ICD-10-CM | POA: Diagnosis not present

## 2021-05-31 DIAGNOSIS — N183 Chronic kidney disease, stage 3 unspecified: Secondary | ICD-10-CM | POA: Diagnosis not present

## 2021-05-31 DIAGNOSIS — I672 Cerebral atherosclerosis: Secondary | ICD-10-CM | POA: Diagnosis not present

## 2021-05-31 DIAGNOSIS — I69354 Hemiplegia and hemiparesis following cerebral infarction affecting left non-dominant side: Secondary | ICD-10-CM | POA: Diagnosis not present

## 2021-06-01 DIAGNOSIS — I129 Hypertensive chronic kidney disease with stage 1 through stage 4 chronic kidney disease, or unspecified chronic kidney disease: Secondary | ICD-10-CM | POA: Diagnosis not present

## 2021-06-01 DIAGNOSIS — E1122 Type 2 diabetes mellitus with diabetic chronic kidney disease: Secondary | ICD-10-CM | POA: Diagnosis not present

## 2021-06-01 DIAGNOSIS — N183 Chronic kidney disease, stage 3 unspecified: Secondary | ICD-10-CM | POA: Diagnosis not present

## 2021-06-01 DIAGNOSIS — I2510 Atherosclerotic heart disease of native coronary artery without angina pectoris: Secondary | ICD-10-CM | POA: Diagnosis not present

## 2021-06-01 DIAGNOSIS — I69354 Hemiplegia and hemiparesis following cerebral infarction affecting left non-dominant side: Secondary | ICD-10-CM | POA: Diagnosis not present

## 2021-06-01 DIAGNOSIS — I672 Cerebral atherosclerosis: Secondary | ICD-10-CM | POA: Diagnosis not present

## 2021-06-02 DIAGNOSIS — I672 Cerebral atherosclerosis: Secondary | ICD-10-CM | POA: Diagnosis not present

## 2021-06-02 DIAGNOSIS — I69354 Hemiplegia and hemiparesis following cerebral infarction affecting left non-dominant side: Secondary | ICD-10-CM | POA: Diagnosis not present

## 2021-06-02 DIAGNOSIS — E1122 Type 2 diabetes mellitus with diabetic chronic kidney disease: Secondary | ICD-10-CM | POA: Diagnosis not present

## 2021-06-02 DIAGNOSIS — I2510 Atherosclerotic heart disease of native coronary artery without angina pectoris: Secondary | ICD-10-CM | POA: Diagnosis not present

## 2021-06-02 DIAGNOSIS — I129 Hypertensive chronic kidney disease with stage 1 through stage 4 chronic kidney disease, or unspecified chronic kidney disease: Secondary | ICD-10-CM | POA: Diagnosis not present

## 2021-06-02 DIAGNOSIS — N183 Chronic kidney disease, stage 3 unspecified: Secondary | ICD-10-CM | POA: Diagnosis not present

## 2021-06-03 DIAGNOSIS — N183 Chronic kidney disease, stage 3 unspecified: Secondary | ICD-10-CM | POA: Diagnosis not present

## 2021-06-03 DIAGNOSIS — E1122 Type 2 diabetes mellitus with diabetic chronic kidney disease: Secondary | ICD-10-CM | POA: Diagnosis not present

## 2021-06-03 DIAGNOSIS — I69354 Hemiplegia and hemiparesis following cerebral infarction affecting left non-dominant side: Secondary | ICD-10-CM | POA: Diagnosis not present

## 2021-06-03 DIAGNOSIS — I129 Hypertensive chronic kidney disease with stage 1 through stage 4 chronic kidney disease, or unspecified chronic kidney disease: Secondary | ICD-10-CM | POA: Diagnosis not present

## 2021-06-03 DIAGNOSIS — I672 Cerebral atherosclerosis: Secondary | ICD-10-CM | POA: Diagnosis not present

## 2021-06-03 DIAGNOSIS — I2510 Atherosclerotic heart disease of native coronary artery without angina pectoris: Secondary | ICD-10-CM | POA: Diagnosis not present

## 2021-06-05 DIAGNOSIS — N183 Chronic kidney disease, stage 3 unspecified: Secondary | ICD-10-CM | POA: Diagnosis not present

## 2021-06-05 DIAGNOSIS — I69354 Hemiplegia and hemiparesis following cerebral infarction affecting left non-dominant side: Secondary | ICD-10-CM | POA: Diagnosis not present

## 2021-06-05 DIAGNOSIS — E1122 Type 2 diabetes mellitus with diabetic chronic kidney disease: Secondary | ICD-10-CM | POA: Diagnosis not present

## 2021-06-05 DIAGNOSIS — I129 Hypertensive chronic kidney disease with stage 1 through stage 4 chronic kidney disease, or unspecified chronic kidney disease: Secondary | ICD-10-CM | POA: Diagnosis not present

## 2021-06-05 DIAGNOSIS — I2510 Atherosclerotic heart disease of native coronary artery without angina pectoris: Secondary | ICD-10-CM | POA: Diagnosis not present

## 2021-06-05 DIAGNOSIS — I672 Cerebral atherosclerosis: Secondary | ICD-10-CM | POA: Diagnosis not present

## 2021-06-07 DIAGNOSIS — N183 Chronic kidney disease, stage 3 unspecified: Secondary | ICD-10-CM | POA: Diagnosis not present

## 2021-06-07 DIAGNOSIS — I129 Hypertensive chronic kidney disease with stage 1 through stage 4 chronic kidney disease, or unspecified chronic kidney disease: Secondary | ICD-10-CM | POA: Diagnosis not present

## 2021-06-07 DIAGNOSIS — I2510 Atherosclerotic heart disease of native coronary artery without angina pectoris: Secondary | ICD-10-CM | POA: Diagnosis not present

## 2021-06-07 DIAGNOSIS — I69354 Hemiplegia and hemiparesis following cerebral infarction affecting left non-dominant side: Secondary | ICD-10-CM | POA: Diagnosis not present

## 2021-06-07 DIAGNOSIS — I672 Cerebral atherosclerosis: Secondary | ICD-10-CM | POA: Diagnosis not present

## 2021-06-07 DIAGNOSIS — E1122 Type 2 diabetes mellitus with diabetic chronic kidney disease: Secondary | ICD-10-CM | POA: Diagnosis not present

## 2021-06-09 DIAGNOSIS — I672 Cerebral atherosclerosis: Secondary | ICD-10-CM | POA: Diagnosis not present

## 2021-06-09 DIAGNOSIS — E1122 Type 2 diabetes mellitus with diabetic chronic kidney disease: Secondary | ICD-10-CM | POA: Diagnosis not present

## 2021-06-09 DIAGNOSIS — I69354 Hemiplegia and hemiparesis following cerebral infarction affecting left non-dominant side: Secondary | ICD-10-CM | POA: Diagnosis not present

## 2021-06-09 DIAGNOSIS — I2510 Atherosclerotic heart disease of native coronary artery without angina pectoris: Secondary | ICD-10-CM | POA: Diagnosis not present

## 2021-06-09 DIAGNOSIS — N183 Chronic kidney disease, stage 3 unspecified: Secondary | ICD-10-CM | POA: Diagnosis not present

## 2021-06-09 DIAGNOSIS — I129 Hypertensive chronic kidney disease with stage 1 through stage 4 chronic kidney disease, or unspecified chronic kidney disease: Secondary | ICD-10-CM | POA: Diagnosis not present

## 2021-06-12 DIAGNOSIS — E1122 Type 2 diabetes mellitus with diabetic chronic kidney disease: Secondary | ICD-10-CM | POA: Diagnosis not present

## 2021-06-12 DIAGNOSIS — I129 Hypertensive chronic kidney disease with stage 1 through stage 4 chronic kidney disease, or unspecified chronic kidney disease: Secondary | ICD-10-CM | POA: Diagnosis not present

## 2021-06-12 DIAGNOSIS — I2510 Atherosclerotic heart disease of native coronary artery without angina pectoris: Secondary | ICD-10-CM | POA: Diagnosis not present

## 2021-06-12 DIAGNOSIS — N183 Chronic kidney disease, stage 3 unspecified: Secondary | ICD-10-CM | POA: Diagnosis not present

## 2021-06-12 DIAGNOSIS — I672 Cerebral atherosclerosis: Secondary | ICD-10-CM | POA: Diagnosis not present

## 2021-06-12 DIAGNOSIS — I69354 Hemiplegia and hemiparesis following cerebral infarction affecting left non-dominant side: Secondary | ICD-10-CM | POA: Diagnosis not present

## 2021-06-13 ENCOUNTER — Other Ambulatory Visit: Payer: Self-pay | Admitting: Cardiovascular Disease

## 2021-06-14 DIAGNOSIS — I129 Hypertensive chronic kidney disease with stage 1 through stage 4 chronic kidney disease, or unspecified chronic kidney disease: Secondary | ICD-10-CM | POA: Diagnosis not present

## 2021-06-14 DIAGNOSIS — I672 Cerebral atherosclerosis: Secondary | ICD-10-CM | POA: Diagnosis not present

## 2021-06-14 DIAGNOSIS — I69354 Hemiplegia and hemiparesis following cerebral infarction affecting left non-dominant side: Secondary | ICD-10-CM | POA: Diagnosis not present

## 2021-06-14 DIAGNOSIS — N183 Chronic kidney disease, stage 3 unspecified: Secondary | ICD-10-CM | POA: Diagnosis not present

## 2021-06-14 DIAGNOSIS — E1122 Type 2 diabetes mellitus with diabetic chronic kidney disease: Secondary | ICD-10-CM | POA: Diagnosis not present

## 2021-06-14 DIAGNOSIS — I2510 Atherosclerotic heart disease of native coronary artery without angina pectoris: Secondary | ICD-10-CM | POA: Diagnosis not present

## 2021-06-16 DIAGNOSIS — E1122 Type 2 diabetes mellitus with diabetic chronic kidney disease: Secondary | ICD-10-CM | POA: Diagnosis not present

## 2021-06-16 DIAGNOSIS — N183 Chronic kidney disease, stage 3 unspecified: Secondary | ICD-10-CM | POA: Diagnosis not present

## 2021-06-16 DIAGNOSIS — I129 Hypertensive chronic kidney disease with stage 1 through stage 4 chronic kidney disease, or unspecified chronic kidney disease: Secondary | ICD-10-CM | POA: Diagnosis not present

## 2021-06-16 DIAGNOSIS — I672 Cerebral atherosclerosis: Secondary | ICD-10-CM | POA: Diagnosis not present

## 2021-06-16 DIAGNOSIS — I69354 Hemiplegia and hemiparesis following cerebral infarction affecting left non-dominant side: Secondary | ICD-10-CM | POA: Diagnosis not present

## 2021-06-16 DIAGNOSIS — I2510 Atherosclerotic heart disease of native coronary artery without angina pectoris: Secondary | ICD-10-CM | POA: Diagnosis not present

## 2021-06-19 DIAGNOSIS — I2510 Atherosclerotic heart disease of native coronary artery without angina pectoris: Secondary | ICD-10-CM | POA: Diagnosis not present

## 2021-06-19 DIAGNOSIS — I129 Hypertensive chronic kidney disease with stage 1 through stage 4 chronic kidney disease, or unspecified chronic kidney disease: Secondary | ICD-10-CM | POA: Diagnosis not present

## 2021-06-19 DIAGNOSIS — I672 Cerebral atherosclerosis: Secondary | ICD-10-CM | POA: Diagnosis not present

## 2021-06-19 DIAGNOSIS — I69354 Hemiplegia and hemiparesis following cerebral infarction affecting left non-dominant side: Secondary | ICD-10-CM | POA: Diagnosis not present

## 2021-06-19 DIAGNOSIS — E1122 Type 2 diabetes mellitus with diabetic chronic kidney disease: Secondary | ICD-10-CM | POA: Diagnosis not present

## 2021-06-19 DIAGNOSIS — N183 Chronic kidney disease, stage 3 unspecified: Secondary | ICD-10-CM | POA: Diagnosis not present

## 2021-06-20 DIAGNOSIS — I129 Hypertensive chronic kidney disease with stage 1 through stage 4 chronic kidney disease, or unspecified chronic kidney disease: Secondary | ICD-10-CM | POA: Diagnosis not present

## 2021-06-20 DIAGNOSIS — I672 Cerebral atherosclerosis: Secondary | ICD-10-CM | POA: Diagnosis not present

## 2021-06-20 DIAGNOSIS — I69354 Hemiplegia and hemiparesis following cerebral infarction affecting left non-dominant side: Secondary | ICD-10-CM | POA: Diagnosis not present

## 2021-06-20 DIAGNOSIS — N183 Chronic kidney disease, stage 3 unspecified: Secondary | ICD-10-CM | POA: Diagnosis not present

## 2021-06-20 DIAGNOSIS — I2510 Atherosclerotic heart disease of native coronary artery without angina pectoris: Secondary | ICD-10-CM | POA: Diagnosis not present

## 2021-06-20 DIAGNOSIS — E1122 Type 2 diabetes mellitus with diabetic chronic kidney disease: Secondary | ICD-10-CM | POA: Diagnosis not present

## 2021-06-21 DIAGNOSIS — I129 Hypertensive chronic kidney disease with stage 1 through stage 4 chronic kidney disease, or unspecified chronic kidney disease: Secondary | ICD-10-CM | POA: Diagnosis not present

## 2021-06-21 DIAGNOSIS — I2510 Atherosclerotic heart disease of native coronary artery without angina pectoris: Secondary | ICD-10-CM | POA: Diagnosis not present

## 2021-06-21 DIAGNOSIS — N183 Chronic kidney disease, stage 3 unspecified: Secondary | ICD-10-CM | POA: Diagnosis not present

## 2021-06-21 DIAGNOSIS — I672 Cerebral atherosclerosis: Secondary | ICD-10-CM | POA: Diagnosis not present

## 2021-06-21 DIAGNOSIS — I69354 Hemiplegia and hemiparesis following cerebral infarction affecting left non-dominant side: Secondary | ICD-10-CM | POA: Diagnosis not present

## 2021-06-21 DIAGNOSIS — E1122 Type 2 diabetes mellitus with diabetic chronic kidney disease: Secondary | ICD-10-CM | POA: Diagnosis not present

## 2021-06-23 DIAGNOSIS — I69354 Hemiplegia and hemiparesis following cerebral infarction affecting left non-dominant side: Secondary | ICD-10-CM | POA: Diagnosis not present

## 2021-06-23 DIAGNOSIS — I672 Cerebral atherosclerosis: Secondary | ICD-10-CM | POA: Diagnosis not present

## 2021-06-23 DIAGNOSIS — E1122 Type 2 diabetes mellitus with diabetic chronic kidney disease: Secondary | ICD-10-CM | POA: Diagnosis not present

## 2021-06-23 DIAGNOSIS — N183 Chronic kidney disease, stage 3 unspecified: Secondary | ICD-10-CM | POA: Diagnosis not present

## 2021-06-23 DIAGNOSIS — I129 Hypertensive chronic kidney disease with stage 1 through stage 4 chronic kidney disease, or unspecified chronic kidney disease: Secondary | ICD-10-CM | POA: Diagnosis not present

## 2021-06-23 DIAGNOSIS — I2510 Atherosclerotic heart disease of native coronary artery without angina pectoris: Secondary | ICD-10-CM | POA: Diagnosis not present

## 2021-06-26 DIAGNOSIS — N183 Chronic kidney disease, stage 3 unspecified: Secondary | ICD-10-CM | POA: Diagnosis not present

## 2021-06-26 DIAGNOSIS — E1122 Type 2 diabetes mellitus with diabetic chronic kidney disease: Secondary | ICD-10-CM | POA: Diagnosis not present

## 2021-06-26 DIAGNOSIS — I672 Cerebral atherosclerosis: Secondary | ICD-10-CM | POA: Diagnosis not present

## 2021-06-26 DIAGNOSIS — I129 Hypertensive chronic kidney disease with stage 1 through stage 4 chronic kidney disease, or unspecified chronic kidney disease: Secondary | ICD-10-CM | POA: Diagnosis not present

## 2021-06-26 DIAGNOSIS — I2510 Atherosclerotic heart disease of native coronary artery without angina pectoris: Secondary | ICD-10-CM | POA: Diagnosis not present

## 2021-06-26 DIAGNOSIS — I69354 Hemiplegia and hemiparesis following cerebral infarction affecting left non-dominant side: Secondary | ICD-10-CM | POA: Diagnosis not present

## 2021-06-28 DIAGNOSIS — I2510 Atherosclerotic heart disease of native coronary artery without angina pectoris: Secondary | ICD-10-CM | POA: Diagnosis not present

## 2021-06-28 DIAGNOSIS — E1122 Type 2 diabetes mellitus with diabetic chronic kidney disease: Secondary | ICD-10-CM | POA: Diagnosis not present

## 2021-06-28 DIAGNOSIS — I672 Cerebral atherosclerosis: Secondary | ICD-10-CM | POA: Diagnosis not present

## 2021-06-28 DIAGNOSIS — N183 Chronic kidney disease, stage 3 unspecified: Secondary | ICD-10-CM | POA: Diagnosis not present

## 2021-06-28 DIAGNOSIS — I129 Hypertensive chronic kidney disease with stage 1 through stage 4 chronic kidney disease, or unspecified chronic kidney disease: Secondary | ICD-10-CM | POA: Diagnosis not present

## 2021-06-28 DIAGNOSIS — I69354 Hemiplegia and hemiparesis following cerebral infarction affecting left non-dominant side: Secondary | ICD-10-CM | POA: Diagnosis not present

## 2021-06-30 DIAGNOSIS — I2510 Atherosclerotic heart disease of native coronary artery without angina pectoris: Secondary | ICD-10-CM | POA: Diagnosis not present

## 2021-06-30 DIAGNOSIS — N183 Chronic kidney disease, stage 3 unspecified: Secondary | ICD-10-CM | POA: Diagnosis not present

## 2021-06-30 DIAGNOSIS — I672 Cerebral atherosclerosis: Secondary | ICD-10-CM | POA: Diagnosis not present

## 2021-06-30 DIAGNOSIS — G44009 Cluster headache syndrome, unspecified, not intractable: Secondary | ICD-10-CM | POA: Diagnosis not present

## 2021-06-30 DIAGNOSIS — Z853 Personal history of malignant neoplasm of breast: Secondary | ICD-10-CM | POA: Diagnosis not present

## 2021-06-30 DIAGNOSIS — E559 Vitamin D deficiency, unspecified: Secondary | ICD-10-CM | POA: Diagnosis not present

## 2021-06-30 DIAGNOSIS — E039 Hypothyroidism, unspecified: Secondary | ICD-10-CM | POA: Diagnosis not present

## 2021-06-30 DIAGNOSIS — G4733 Obstructive sleep apnea (adult) (pediatric): Secondary | ICD-10-CM | POA: Diagnosis not present

## 2021-06-30 DIAGNOSIS — F32A Depression, unspecified: Secondary | ICD-10-CM | POA: Diagnosis not present

## 2021-06-30 DIAGNOSIS — E1122 Type 2 diabetes mellitus with diabetic chronic kidney disease: Secondary | ICD-10-CM | POA: Diagnosis not present

## 2021-06-30 DIAGNOSIS — E785 Hyperlipidemia, unspecified: Secondary | ICD-10-CM | POA: Diagnosis not present

## 2021-06-30 DIAGNOSIS — I69354 Hemiplegia and hemiparesis following cerebral infarction affecting left non-dominant side: Secondary | ICD-10-CM | POA: Diagnosis not present

## 2021-06-30 DIAGNOSIS — I129 Hypertensive chronic kidney disease with stage 1 through stage 4 chronic kidney disease, or unspecified chronic kidney disease: Secondary | ICD-10-CM | POA: Diagnosis not present

## 2021-07-03 DIAGNOSIS — I129 Hypertensive chronic kidney disease with stage 1 through stage 4 chronic kidney disease, or unspecified chronic kidney disease: Secondary | ICD-10-CM | POA: Diagnosis not present

## 2021-07-03 DIAGNOSIS — I69354 Hemiplegia and hemiparesis following cerebral infarction affecting left non-dominant side: Secondary | ICD-10-CM | POA: Diagnosis not present

## 2021-07-03 DIAGNOSIS — E1122 Type 2 diabetes mellitus with diabetic chronic kidney disease: Secondary | ICD-10-CM | POA: Diagnosis not present

## 2021-07-03 DIAGNOSIS — I672 Cerebral atherosclerosis: Secondary | ICD-10-CM | POA: Diagnosis not present

## 2021-07-03 DIAGNOSIS — I2510 Atherosclerotic heart disease of native coronary artery without angina pectoris: Secondary | ICD-10-CM | POA: Diagnosis not present

## 2021-07-03 DIAGNOSIS — N183 Chronic kidney disease, stage 3 unspecified: Secondary | ICD-10-CM | POA: Diagnosis not present

## 2021-07-04 DIAGNOSIS — I69354 Hemiplegia and hemiparesis following cerebral infarction affecting left non-dominant side: Secondary | ICD-10-CM | POA: Diagnosis not present

## 2021-07-04 DIAGNOSIS — I2510 Atherosclerotic heart disease of native coronary artery without angina pectoris: Secondary | ICD-10-CM | POA: Diagnosis not present

## 2021-07-04 DIAGNOSIS — N183 Chronic kidney disease, stage 3 unspecified: Secondary | ICD-10-CM | POA: Diagnosis not present

## 2021-07-04 DIAGNOSIS — I129 Hypertensive chronic kidney disease with stage 1 through stage 4 chronic kidney disease, or unspecified chronic kidney disease: Secondary | ICD-10-CM | POA: Diagnosis not present

## 2021-07-04 DIAGNOSIS — I672 Cerebral atherosclerosis: Secondary | ICD-10-CM | POA: Diagnosis not present

## 2021-07-04 DIAGNOSIS — E1122 Type 2 diabetes mellitus with diabetic chronic kidney disease: Secondary | ICD-10-CM | POA: Diagnosis not present

## 2021-07-05 DIAGNOSIS — I2510 Atherosclerotic heart disease of native coronary artery without angina pectoris: Secondary | ICD-10-CM | POA: Diagnosis not present

## 2021-07-05 DIAGNOSIS — I672 Cerebral atherosclerosis: Secondary | ICD-10-CM | POA: Diagnosis not present

## 2021-07-05 DIAGNOSIS — E1122 Type 2 diabetes mellitus with diabetic chronic kidney disease: Secondary | ICD-10-CM | POA: Diagnosis not present

## 2021-07-05 DIAGNOSIS — I69354 Hemiplegia and hemiparesis following cerebral infarction affecting left non-dominant side: Secondary | ICD-10-CM | POA: Diagnosis not present

## 2021-07-05 DIAGNOSIS — I129 Hypertensive chronic kidney disease with stage 1 through stage 4 chronic kidney disease, or unspecified chronic kidney disease: Secondary | ICD-10-CM | POA: Diagnosis not present

## 2021-07-05 DIAGNOSIS — N183 Chronic kidney disease, stage 3 unspecified: Secondary | ICD-10-CM | POA: Diagnosis not present

## 2021-07-07 DIAGNOSIS — I129 Hypertensive chronic kidney disease with stage 1 through stage 4 chronic kidney disease, or unspecified chronic kidney disease: Secondary | ICD-10-CM | POA: Diagnosis not present

## 2021-07-07 DIAGNOSIS — E1122 Type 2 diabetes mellitus with diabetic chronic kidney disease: Secondary | ICD-10-CM | POA: Diagnosis not present

## 2021-07-07 DIAGNOSIS — I69354 Hemiplegia and hemiparesis following cerebral infarction affecting left non-dominant side: Secondary | ICD-10-CM | POA: Diagnosis not present

## 2021-07-07 DIAGNOSIS — I2510 Atherosclerotic heart disease of native coronary artery without angina pectoris: Secondary | ICD-10-CM | POA: Diagnosis not present

## 2021-07-07 DIAGNOSIS — I672 Cerebral atherosclerosis: Secondary | ICD-10-CM | POA: Diagnosis not present

## 2021-07-07 DIAGNOSIS — N183 Chronic kidney disease, stage 3 unspecified: Secondary | ICD-10-CM | POA: Diagnosis not present

## 2021-07-10 DIAGNOSIS — I69354 Hemiplegia and hemiparesis following cerebral infarction affecting left non-dominant side: Secondary | ICD-10-CM | POA: Diagnosis not present

## 2021-07-10 DIAGNOSIS — I672 Cerebral atherosclerosis: Secondary | ICD-10-CM | POA: Diagnosis not present

## 2021-07-10 DIAGNOSIS — N183 Chronic kidney disease, stage 3 unspecified: Secondary | ICD-10-CM | POA: Diagnosis not present

## 2021-07-10 DIAGNOSIS — E1122 Type 2 diabetes mellitus with diabetic chronic kidney disease: Secondary | ICD-10-CM | POA: Diagnosis not present

## 2021-07-10 DIAGNOSIS — I2510 Atherosclerotic heart disease of native coronary artery without angina pectoris: Secondary | ICD-10-CM | POA: Diagnosis not present

## 2021-07-10 DIAGNOSIS — I129 Hypertensive chronic kidney disease with stage 1 through stage 4 chronic kidney disease, or unspecified chronic kidney disease: Secondary | ICD-10-CM | POA: Diagnosis not present

## 2021-07-11 DIAGNOSIS — I2510 Atherosclerotic heart disease of native coronary artery without angina pectoris: Secondary | ICD-10-CM | POA: Diagnosis not present

## 2021-07-11 DIAGNOSIS — I672 Cerebral atherosclerosis: Secondary | ICD-10-CM | POA: Diagnosis not present

## 2021-07-11 DIAGNOSIS — I69354 Hemiplegia and hemiparesis following cerebral infarction affecting left non-dominant side: Secondary | ICD-10-CM | POA: Diagnosis not present

## 2021-07-11 DIAGNOSIS — I129 Hypertensive chronic kidney disease with stage 1 through stage 4 chronic kidney disease, or unspecified chronic kidney disease: Secondary | ICD-10-CM | POA: Diagnosis not present

## 2021-07-11 DIAGNOSIS — N183 Chronic kidney disease, stage 3 unspecified: Secondary | ICD-10-CM | POA: Diagnosis not present

## 2021-07-11 DIAGNOSIS — E1122 Type 2 diabetes mellitus with diabetic chronic kidney disease: Secondary | ICD-10-CM | POA: Diagnosis not present

## 2021-07-12 DIAGNOSIS — I672 Cerebral atherosclerosis: Secondary | ICD-10-CM | POA: Diagnosis not present

## 2021-07-12 DIAGNOSIS — I129 Hypertensive chronic kidney disease with stage 1 through stage 4 chronic kidney disease, or unspecified chronic kidney disease: Secondary | ICD-10-CM | POA: Diagnosis not present

## 2021-07-12 DIAGNOSIS — I2510 Atherosclerotic heart disease of native coronary artery without angina pectoris: Secondary | ICD-10-CM | POA: Diagnosis not present

## 2021-07-12 DIAGNOSIS — N183 Chronic kidney disease, stage 3 unspecified: Secondary | ICD-10-CM | POA: Diagnosis not present

## 2021-07-12 DIAGNOSIS — I69354 Hemiplegia and hemiparesis following cerebral infarction affecting left non-dominant side: Secondary | ICD-10-CM | POA: Diagnosis not present

## 2021-07-12 DIAGNOSIS — E1122 Type 2 diabetes mellitus with diabetic chronic kidney disease: Secondary | ICD-10-CM | POA: Diagnosis not present

## 2021-07-14 DIAGNOSIS — I69354 Hemiplegia and hemiparesis following cerebral infarction affecting left non-dominant side: Secondary | ICD-10-CM | POA: Diagnosis not present

## 2021-07-14 DIAGNOSIS — I129 Hypertensive chronic kidney disease with stage 1 through stage 4 chronic kidney disease, or unspecified chronic kidney disease: Secondary | ICD-10-CM | POA: Diagnosis not present

## 2021-07-14 DIAGNOSIS — E1122 Type 2 diabetes mellitus with diabetic chronic kidney disease: Secondary | ICD-10-CM | POA: Diagnosis not present

## 2021-07-14 DIAGNOSIS — I672 Cerebral atherosclerosis: Secondary | ICD-10-CM | POA: Diagnosis not present

## 2021-07-14 DIAGNOSIS — I2510 Atherosclerotic heart disease of native coronary artery without angina pectoris: Secondary | ICD-10-CM | POA: Diagnosis not present

## 2021-07-14 DIAGNOSIS — N183 Chronic kidney disease, stage 3 unspecified: Secondary | ICD-10-CM | POA: Diagnosis not present

## 2021-07-17 DIAGNOSIS — I129 Hypertensive chronic kidney disease with stage 1 through stage 4 chronic kidney disease, or unspecified chronic kidney disease: Secondary | ICD-10-CM | POA: Diagnosis not present

## 2021-07-17 DIAGNOSIS — E1122 Type 2 diabetes mellitus with diabetic chronic kidney disease: Secondary | ICD-10-CM | POA: Diagnosis not present

## 2021-07-17 DIAGNOSIS — I672 Cerebral atherosclerosis: Secondary | ICD-10-CM | POA: Diagnosis not present

## 2021-07-17 DIAGNOSIS — I2510 Atherosclerotic heart disease of native coronary artery without angina pectoris: Secondary | ICD-10-CM | POA: Diagnosis not present

## 2021-07-17 DIAGNOSIS — I69354 Hemiplegia and hemiparesis following cerebral infarction affecting left non-dominant side: Secondary | ICD-10-CM | POA: Diagnosis not present

## 2021-07-17 DIAGNOSIS — N183 Chronic kidney disease, stage 3 unspecified: Secondary | ICD-10-CM | POA: Diagnosis not present

## 2021-07-18 DIAGNOSIS — I2510 Atherosclerotic heart disease of native coronary artery without angina pectoris: Secondary | ICD-10-CM | POA: Diagnosis not present

## 2021-07-18 DIAGNOSIS — I69354 Hemiplegia and hemiparesis following cerebral infarction affecting left non-dominant side: Secondary | ICD-10-CM | POA: Diagnosis not present

## 2021-07-18 DIAGNOSIS — N183 Chronic kidney disease, stage 3 unspecified: Secondary | ICD-10-CM | POA: Diagnosis not present

## 2021-07-18 DIAGNOSIS — I129 Hypertensive chronic kidney disease with stage 1 through stage 4 chronic kidney disease, or unspecified chronic kidney disease: Secondary | ICD-10-CM | POA: Diagnosis not present

## 2021-07-18 DIAGNOSIS — I672 Cerebral atherosclerosis: Secondary | ICD-10-CM | POA: Diagnosis not present

## 2021-07-18 DIAGNOSIS — E1122 Type 2 diabetes mellitus with diabetic chronic kidney disease: Secondary | ICD-10-CM | POA: Diagnosis not present

## 2021-07-18 NOTE — Telephone Encounter (Signed)
Attempted to schedule.  LMOV to call office.  ° °

## 2021-07-19 DIAGNOSIS — I129 Hypertensive chronic kidney disease with stage 1 through stage 4 chronic kidney disease, or unspecified chronic kidney disease: Secondary | ICD-10-CM | POA: Diagnosis not present

## 2021-07-19 DIAGNOSIS — I2510 Atherosclerotic heart disease of native coronary artery without angina pectoris: Secondary | ICD-10-CM | POA: Diagnosis not present

## 2021-07-19 DIAGNOSIS — I672 Cerebral atherosclerosis: Secondary | ICD-10-CM | POA: Diagnosis not present

## 2021-07-19 DIAGNOSIS — I69354 Hemiplegia and hemiparesis following cerebral infarction affecting left non-dominant side: Secondary | ICD-10-CM | POA: Diagnosis not present

## 2021-07-19 DIAGNOSIS — N183 Chronic kidney disease, stage 3 unspecified: Secondary | ICD-10-CM | POA: Diagnosis not present

## 2021-07-19 DIAGNOSIS — E1122 Type 2 diabetes mellitus with diabetic chronic kidney disease: Secondary | ICD-10-CM | POA: Diagnosis not present

## 2021-07-21 DIAGNOSIS — I672 Cerebral atherosclerosis: Secondary | ICD-10-CM | POA: Diagnosis not present

## 2021-07-21 DIAGNOSIS — I2510 Atherosclerotic heart disease of native coronary artery without angina pectoris: Secondary | ICD-10-CM | POA: Diagnosis not present

## 2021-07-21 DIAGNOSIS — I69354 Hemiplegia and hemiparesis following cerebral infarction affecting left non-dominant side: Secondary | ICD-10-CM | POA: Diagnosis not present

## 2021-07-21 DIAGNOSIS — I129 Hypertensive chronic kidney disease with stage 1 through stage 4 chronic kidney disease, or unspecified chronic kidney disease: Secondary | ICD-10-CM | POA: Diagnosis not present

## 2021-07-21 DIAGNOSIS — E1122 Type 2 diabetes mellitus with diabetic chronic kidney disease: Secondary | ICD-10-CM | POA: Diagnosis not present

## 2021-07-21 DIAGNOSIS — N183 Chronic kidney disease, stage 3 unspecified: Secondary | ICD-10-CM | POA: Diagnosis not present

## 2021-07-24 DIAGNOSIS — N183 Chronic kidney disease, stage 3 unspecified: Secondary | ICD-10-CM | POA: Diagnosis not present

## 2021-07-24 DIAGNOSIS — E1122 Type 2 diabetes mellitus with diabetic chronic kidney disease: Secondary | ICD-10-CM | POA: Diagnosis not present

## 2021-07-24 DIAGNOSIS — I69354 Hemiplegia and hemiparesis following cerebral infarction affecting left non-dominant side: Secondary | ICD-10-CM | POA: Diagnosis not present

## 2021-07-24 DIAGNOSIS — I129 Hypertensive chronic kidney disease with stage 1 through stage 4 chronic kidney disease, or unspecified chronic kidney disease: Secondary | ICD-10-CM | POA: Diagnosis not present

## 2021-07-24 DIAGNOSIS — I672 Cerebral atherosclerosis: Secondary | ICD-10-CM | POA: Diagnosis not present

## 2021-07-24 DIAGNOSIS — I2510 Atherosclerotic heart disease of native coronary artery without angina pectoris: Secondary | ICD-10-CM | POA: Diagnosis not present

## 2021-07-26 DIAGNOSIS — I129 Hypertensive chronic kidney disease with stage 1 through stage 4 chronic kidney disease, or unspecified chronic kidney disease: Secondary | ICD-10-CM | POA: Diagnosis not present

## 2021-07-26 DIAGNOSIS — N183 Chronic kidney disease, stage 3 unspecified: Secondary | ICD-10-CM | POA: Diagnosis not present

## 2021-07-26 DIAGNOSIS — I69354 Hemiplegia and hemiparesis following cerebral infarction affecting left non-dominant side: Secondary | ICD-10-CM | POA: Diagnosis not present

## 2021-07-26 DIAGNOSIS — I2510 Atherosclerotic heart disease of native coronary artery without angina pectoris: Secondary | ICD-10-CM | POA: Diagnosis not present

## 2021-07-26 DIAGNOSIS — I672 Cerebral atherosclerosis: Secondary | ICD-10-CM | POA: Diagnosis not present

## 2021-07-26 DIAGNOSIS — E1122 Type 2 diabetes mellitus with diabetic chronic kidney disease: Secondary | ICD-10-CM | POA: Diagnosis not present

## 2021-07-28 DIAGNOSIS — N183 Chronic kidney disease, stage 3 unspecified: Secondary | ICD-10-CM | POA: Diagnosis not present

## 2021-07-28 DIAGNOSIS — I69354 Hemiplegia and hemiparesis following cerebral infarction affecting left non-dominant side: Secondary | ICD-10-CM | POA: Diagnosis not present

## 2021-07-28 DIAGNOSIS — I672 Cerebral atherosclerosis: Secondary | ICD-10-CM | POA: Diagnosis not present

## 2021-07-28 DIAGNOSIS — E1122 Type 2 diabetes mellitus with diabetic chronic kidney disease: Secondary | ICD-10-CM | POA: Diagnosis not present

## 2021-07-28 DIAGNOSIS — I129 Hypertensive chronic kidney disease with stage 1 through stage 4 chronic kidney disease, or unspecified chronic kidney disease: Secondary | ICD-10-CM | POA: Diagnosis not present

## 2021-07-28 DIAGNOSIS — I2510 Atherosclerotic heart disease of native coronary artery without angina pectoris: Secondary | ICD-10-CM | POA: Diagnosis not present

## 2021-07-31 DIAGNOSIS — G44009 Cluster headache syndrome, unspecified, not intractable: Secondary | ICD-10-CM | POA: Diagnosis not present

## 2021-07-31 DIAGNOSIS — E785 Hyperlipidemia, unspecified: Secondary | ICD-10-CM | POA: Diagnosis not present

## 2021-07-31 DIAGNOSIS — E039 Hypothyroidism, unspecified: Secondary | ICD-10-CM | POA: Diagnosis not present

## 2021-07-31 DIAGNOSIS — E559 Vitamin D deficiency, unspecified: Secondary | ICD-10-CM | POA: Diagnosis not present

## 2021-07-31 DIAGNOSIS — N183 Chronic kidney disease, stage 3 unspecified: Secondary | ICD-10-CM | POA: Diagnosis not present

## 2021-07-31 DIAGNOSIS — I69354 Hemiplegia and hemiparesis following cerebral infarction affecting left non-dominant side: Secondary | ICD-10-CM | POA: Diagnosis not present

## 2021-07-31 DIAGNOSIS — K219 Gastro-esophageal reflux disease without esophagitis: Secondary | ICD-10-CM | POA: Diagnosis not present

## 2021-07-31 DIAGNOSIS — G4733 Obstructive sleep apnea (adult) (pediatric): Secondary | ICD-10-CM | POA: Diagnosis not present

## 2021-07-31 DIAGNOSIS — I129 Hypertensive chronic kidney disease with stage 1 through stage 4 chronic kidney disease, or unspecified chronic kidney disease: Secondary | ICD-10-CM | POA: Diagnosis not present

## 2021-07-31 DIAGNOSIS — I2510 Atherosclerotic heart disease of native coronary artery without angina pectoris: Secondary | ICD-10-CM | POA: Diagnosis not present

## 2021-07-31 DIAGNOSIS — Z853 Personal history of malignant neoplasm of breast: Secondary | ICD-10-CM | POA: Diagnosis not present

## 2021-07-31 DIAGNOSIS — F32A Depression, unspecified: Secondary | ICD-10-CM | POA: Diagnosis not present

## 2021-07-31 DIAGNOSIS — I672 Cerebral atherosclerosis: Secondary | ICD-10-CM | POA: Diagnosis not present

## 2021-07-31 DIAGNOSIS — E1122 Type 2 diabetes mellitus with diabetic chronic kidney disease: Secondary | ICD-10-CM | POA: Diagnosis not present

## 2021-08-01 DIAGNOSIS — I672 Cerebral atherosclerosis: Secondary | ICD-10-CM | POA: Diagnosis not present

## 2021-08-01 DIAGNOSIS — I129 Hypertensive chronic kidney disease with stage 1 through stage 4 chronic kidney disease, or unspecified chronic kidney disease: Secondary | ICD-10-CM | POA: Diagnosis not present

## 2021-08-01 DIAGNOSIS — N183 Chronic kidney disease, stage 3 unspecified: Secondary | ICD-10-CM | POA: Diagnosis not present

## 2021-08-01 DIAGNOSIS — E1122 Type 2 diabetes mellitus with diabetic chronic kidney disease: Secondary | ICD-10-CM | POA: Diagnosis not present

## 2021-08-01 DIAGNOSIS — I69354 Hemiplegia and hemiparesis following cerebral infarction affecting left non-dominant side: Secondary | ICD-10-CM | POA: Diagnosis not present

## 2021-08-01 DIAGNOSIS — I2510 Atherosclerotic heart disease of native coronary artery without angina pectoris: Secondary | ICD-10-CM | POA: Diagnosis not present

## 2021-08-02 DIAGNOSIS — I69354 Hemiplegia and hemiparesis following cerebral infarction affecting left non-dominant side: Secondary | ICD-10-CM | POA: Diagnosis not present

## 2021-08-02 DIAGNOSIS — I129 Hypertensive chronic kidney disease with stage 1 through stage 4 chronic kidney disease, or unspecified chronic kidney disease: Secondary | ICD-10-CM | POA: Diagnosis not present

## 2021-08-02 DIAGNOSIS — I2510 Atherosclerotic heart disease of native coronary artery without angina pectoris: Secondary | ICD-10-CM | POA: Diagnosis not present

## 2021-08-02 DIAGNOSIS — I672 Cerebral atherosclerosis: Secondary | ICD-10-CM | POA: Diagnosis not present

## 2021-08-02 DIAGNOSIS — E1122 Type 2 diabetes mellitus with diabetic chronic kidney disease: Secondary | ICD-10-CM | POA: Diagnosis not present

## 2021-08-02 DIAGNOSIS — N183 Chronic kidney disease, stage 3 unspecified: Secondary | ICD-10-CM | POA: Diagnosis not present

## 2021-08-04 DIAGNOSIS — E1122 Type 2 diabetes mellitus with diabetic chronic kidney disease: Secondary | ICD-10-CM | POA: Diagnosis not present

## 2021-08-04 DIAGNOSIS — N183 Chronic kidney disease, stage 3 unspecified: Secondary | ICD-10-CM | POA: Diagnosis not present

## 2021-08-04 DIAGNOSIS — I672 Cerebral atherosclerosis: Secondary | ICD-10-CM | POA: Diagnosis not present

## 2021-08-04 DIAGNOSIS — I69354 Hemiplegia and hemiparesis following cerebral infarction affecting left non-dominant side: Secondary | ICD-10-CM | POA: Diagnosis not present

## 2021-08-04 DIAGNOSIS — I2510 Atherosclerotic heart disease of native coronary artery without angina pectoris: Secondary | ICD-10-CM | POA: Diagnosis not present

## 2021-08-04 DIAGNOSIS — I129 Hypertensive chronic kidney disease with stage 1 through stage 4 chronic kidney disease, or unspecified chronic kidney disease: Secondary | ICD-10-CM | POA: Diagnosis not present

## 2021-08-07 DIAGNOSIS — I672 Cerebral atherosclerosis: Secondary | ICD-10-CM | POA: Diagnosis not present

## 2021-08-07 DIAGNOSIS — E1122 Type 2 diabetes mellitus with diabetic chronic kidney disease: Secondary | ICD-10-CM | POA: Diagnosis not present

## 2021-08-07 DIAGNOSIS — I2510 Atherosclerotic heart disease of native coronary artery without angina pectoris: Secondary | ICD-10-CM | POA: Diagnosis not present

## 2021-08-07 DIAGNOSIS — I129 Hypertensive chronic kidney disease with stage 1 through stage 4 chronic kidney disease, or unspecified chronic kidney disease: Secondary | ICD-10-CM | POA: Diagnosis not present

## 2021-08-07 DIAGNOSIS — N183 Chronic kidney disease, stage 3 unspecified: Secondary | ICD-10-CM | POA: Diagnosis not present

## 2021-08-07 DIAGNOSIS — I69354 Hemiplegia and hemiparesis following cerebral infarction affecting left non-dominant side: Secondary | ICD-10-CM | POA: Diagnosis not present

## 2021-08-08 DIAGNOSIS — I2510 Atherosclerotic heart disease of native coronary artery without angina pectoris: Secondary | ICD-10-CM | POA: Diagnosis not present

## 2021-08-08 DIAGNOSIS — I69354 Hemiplegia and hemiparesis following cerebral infarction affecting left non-dominant side: Secondary | ICD-10-CM | POA: Diagnosis not present

## 2021-08-08 DIAGNOSIS — I672 Cerebral atherosclerosis: Secondary | ICD-10-CM | POA: Diagnosis not present

## 2021-08-08 DIAGNOSIS — I129 Hypertensive chronic kidney disease with stage 1 through stage 4 chronic kidney disease, or unspecified chronic kidney disease: Secondary | ICD-10-CM | POA: Diagnosis not present

## 2021-08-08 DIAGNOSIS — E1122 Type 2 diabetes mellitus with diabetic chronic kidney disease: Secondary | ICD-10-CM | POA: Diagnosis not present

## 2021-08-08 DIAGNOSIS — N183 Chronic kidney disease, stage 3 unspecified: Secondary | ICD-10-CM | POA: Diagnosis not present

## 2021-08-09 DIAGNOSIS — I672 Cerebral atherosclerosis: Secondary | ICD-10-CM | POA: Diagnosis not present

## 2021-08-09 DIAGNOSIS — E1122 Type 2 diabetes mellitus with diabetic chronic kidney disease: Secondary | ICD-10-CM | POA: Diagnosis not present

## 2021-08-09 DIAGNOSIS — I129 Hypertensive chronic kidney disease with stage 1 through stage 4 chronic kidney disease, or unspecified chronic kidney disease: Secondary | ICD-10-CM | POA: Diagnosis not present

## 2021-08-09 DIAGNOSIS — I2510 Atherosclerotic heart disease of native coronary artery without angina pectoris: Secondary | ICD-10-CM | POA: Diagnosis not present

## 2021-08-09 DIAGNOSIS — I69354 Hemiplegia and hemiparesis following cerebral infarction affecting left non-dominant side: Secondary | ICD-10-CM | POA: Diagnosis not present

## 2021-08-09 DIAGNOSIS — N183 Chronic kidney disease, stage 3 unspecified: Secondary | ICD-10-CM | POA: Diagnosis not present

## 2021-08-10 DIAGNOSIS — I69354 Hemiplegia and hemiparesis following cerebral infarction affecting left non-dominant side: Secondary | ICD-10-CM | POA: Diagnosis not present

## 2021-08-10 DIAGNOSIS — N183 Chronic kidney disease, stage 3 unspecified: Secondary | ICD-10-CM | POA: Diagnosis not present

## 2021-08-10 DIAGNOSIS — I2510 Atherosclerotic heart disease of native coronary artery without angina pectoris: Secondary | ICD-10-CM | POA: Diagnosis not present

## 2021-08-10 DIAGNOSIS — I129 Hypertensive chronic kidney disease with stage 1 through stage 4 chronic kidney disease, or unspecified chronic kidney disease: Secondary | ICD-10-CM | POA: Diagnosis not present

## 2021-08-10 DIAGNOSIS — E1122 Type 2 diabetes mellitus with diabetic chronic kidney disease: Secondary | ICD-10-CM | POA: Diagnosis not present

## 2021-08-10 DIAGNOSIS — I672 Cerebral atherosclerosis: Secondary | ICD-10-CM | POA: Diagnosis not present

## 2021-08-11 ENCOUNTER — Inpatient Hospital Stay (HOSPITAL_COMMUNITY)

## 2021-08-11 ENCOUNTER — Inpatient Hospital Stay (HOSPITAL_COMMUNITY)
Admission: EM | Admit: 2021-08-11 | Discharge: 2021-08-15 | DRG: 064 | Disposition: A | Discharge status: Hospice | Attending: Internal Medicine | Admitting: Internal Medicine

## 2021-08-11 ENCOUNTER — Emergency Department (HOSPITAL_COMMUNITY)

## 2021-08-11 ENCOUNTER — Encounter (HOSPITAL_COMMUNITY): Payer: Self-pay | Admitting: Emergency Medicine

## 2021-08-11 ENCOUNTER — Other Ambulatory Visit: Payer: Self-pay

## 2021-08-11 DIAGNOSIS — J690 Pneumonitis due to inhalation of food and vomit: Secondary | ICD-10-CM | POA: Diagnosis present

## 2021-08-11 DIAGNOSIS — I6381 Other cerebral infarction due to occlusion or stenosis of small artery: Secondary | ICD-10-CM | POA: Diagnosis not present

## 2021-08-11 DIAGNOSIS — J45909 Unspecified asthma, uncomplicated: Secondary | ICD-10-CM | POA: Diagnosis present

## 2021-08-11 DIAGNOSIS — R109 Unspecified abdominal pain: Secondary | ICD-10-CM | POA: Diagnosis not present

## 2021-08-11 DIAGNOSIS — E860 Dehydration: Secondary | ICD-10-CM | POA: Diagnosis present

## 2021-08-11 DIAGNOSIS — Z743 Need for continuous supervision: Secondary | ICD-10-CM | POA: Diagnosis not present

## 2021-08-11 DIAGNOSIS — Z8041 Family history of malignant neoplasm of ovary: Secondary | ICD-10-CM

## 2021-08-11 DIAGNOSIS — E870 Hyperosmolality and hypernatremia: Secondary | ICD-10-CM | POA: Diagnosis present

## 2021-08-11 DIAGNOSIS — G9341 Metabolic encephalopathy: Secondary | ICD-10-CM | POA: Diagnosis present

## 2021-08-11 DIAGNOSIS — K567 Ileus, unspecified: Secondary | ICD-10-CM | POA: Diagnosis present

## 2021-08-11 DIAGNOSIS — Z9012 Acquired absence of left breast and nipple: Secondary | ICD-10-CM

## 2021-08-11 DIAGNOSIS — G4733 Obstructive sleep apnea (adult) (pediatric): Secondary | ICD-10-CM | POA: Diagnosis present

## 2021-08-11 DIAGNOSIS — I2510 Atherosclerotic heart disease of native coronary artery without angina pectoris: Secondary | ICD-10-CM | POA: Diagnosis present

## 2021-08-11 DIAGNOSIS — E1169 Type 2 diabetes mellitus with other specified complication: Secondary | ICD-10-CM | POA: Diagnosis present

## 2021-08-11 DIAGNOSIS — J189 Pneumonia, unspecified organism: Secondary | ICD-10-CM | POA: Diagnosis not present

## 2021-08-11 DIAGNOSIS — I10 Essential (primary) hypertension: Secondary | ICD-10-CM | POA: Diagnosis present

## 2021-08-11 DIAGNOSIS — L89622 Pressure ulcer of left heel, stage 2: Secondary | ICD-10-CM | POA: Diagnosis present

## 2021-08-11 DIAGNOSIS — E1122 Type 2 diabetes mellitus with diabetic chronic kidney disease: Secondary | ICD-10-CM | POA: Diagnosis present

## 2021-08-11 DIAGNOSIS — E039 Hypothyroidism, unspecified: Secondary | ICD-10-CM | POA: Diagnosis present

## 2021-08-11 DIAGNOSIS — E11649 Type 2 diabetes mellitus with hypoglycemia without coma: Secondary | ICD-10-CM | POA: Diagnosis not present

## 2021-08-11 DIAGNOSIS — E785 Hyperlipidemia, unspecified: Secondary | ICD-10-CM | POA: Diagnosis present

## 2021-08-11 DIAGNOSIS — E1110 Type 2 diabetes mellitus with ketoacidosis without coma: Secondary | ICD-10-CM | POA: Diagnosis present

## 2021-08-11 DIAGNOSIS — Z170 Estrogen receptor positive status [ER+]: Secondary | ICD-10-CM

## 2021-08-11 DIAGNOSIS — Z7189 Other specified counseling: Secondary | ICD-10-CM

## 2021-08-11 DIAGNOSIS — Z8616 Personal history of COVID-19: Secondary | ICD-10-CM | POA: Diagnosis not present

## 2021-08-11 DIAGNOSIS — L89312 Pressure ulcer of right buttock, stage 2: Secondary | ICD-10-CM | POA: Diagnosis present

## 2021-08-11 DIAGNOSIS — R0902 Hypoxemia: Secondary | ICD-10-CM | POA: Diagnosis present

## 2021-08-11 DIAGNOSIS — R404 Transient alteration of awareness: Secondary | ICD-10-CM | POA: Diagnosis not present

## 2021-08-11 DIAGNOSIS — K5900 Constipation, unspecified: Secondary | ICD-10-CM | POA: Diagnosis not present

## 2021-08-11 DIAGNOSIS — L89322 Pressure ulcer of left buttock, stage 2: Secondary | ICD-10-CM | POA: Diagnosis present

## 2021-08-11 DIAGNOSIS — Z515 Encounter for palliative care: Secondary | ICD-10-CM

## 2021-08-11 DIAGNOSIS — N1832 Chronic kidney disease, stage 3b: Secondary | ICD-10-CM | POA: Diagnosis present

## 2021-08-11 DIAGNOSIS — Z800 Family history of malignant neoplasm of digestive organs: Secondary | ICD-10-CM

## 2021-08-11 DIAGNOSIS — Z808 Family history of malignant neoplasm of other organs or systems: Secondary | ICD-10-CM

## 2021-08-11 DIAGNOSIS — R29713 NIHSS score 13: Secondary | ICD-10-CM | POA: Diagnosis present

## 2021-08-11 DIAGNOSIS — I129 Hypertensive chronic kidney disease with stage 1 through stage 4 chronic kidney disease, or unspecified chronic kidney disease: Secondary | ICD-10-CM | POA: Diagnosis present

## 2021-08-11 DIAGNOSIS — R739 Hyperglycemia, unspecified: Secondary | ICD-10-CM | POA: Diagnosis not present

## 2021-08-11 DIAGNOSIS — N179 Acute kidney failure, unspecified: Secondary | ICD-10-CM | POA: Diagnosis not present

## 2021-08-11 DIAGNOSIS — R110 Nausea: Secondary | ICD-10-CM

## 2021-08-11 DIAGNOSIS — L8990 Pressure ulcer of unspecified site, unspecified stage: Secondary | ICD-10-CM | POA: Insufficient documentation

## 2021-08-11 DIAGNOSIS — Z66 Do not resuscitate: Secondary | ICD-10-CM | POA: Diagnosis present

## 2021-08-11 DIAGNOSIS — I700 Atherosclerosis of aorta: Secondary | ICD-10-CM | POA: Diagnosis not present

## 2021-08-11 DIAGNOSIS — K219 Gastro-esophageal reflux disease without esophagitis: Secondary | ICD-10-CM | POA: Diagnosis present

## 2021-08-11 DIAGNOSIS — Z888 Allergy status to other drugs, medicaments and biological substances status: Secondary | ICD-10-CM

## 2021-08-11 DIAGNOSIS — I672 Cerebral atherosclerosis: Secondary | ICD-10-CM | POA: Diagnosis not present

## 2021-08-11 DIAGNOSIS — Z806 Family history of leukemia: Secondary | ICD-10-CM

## 2021-08-11 DIAGNOSIS — I69354 Hemiplegia and hemiparesis following cerebral infarction affecting left non-dominant side: Secondary | ICD-10-CM

## 2021-08-11 DIAGNOSIS — Z833 Family history of diabetes mellitus: Secondary | ICD-10-CM

## 2021-08-11 DIAGNOSIS — F0150 Vascular dementia without behavioral disturbance: Secondary | ICD-10-CM | POA: Diagnosis present

## 2021-08-11 DIAGNOSIS — E876 Hypokalemia: Secondary | ICD-10-CM | POA: Diagnosis present

## 2021-08-11 DIAGNOSIS — Z8249 Family history of ischemic heart disease and other diseases of the circulatory system: Secondary | ICD-10-CM

## 2021-08-11 DIAGNOSIS — R4182 Altered mental status, unspecified: Secondary | ICD-10-CM | POA: Diagnosis not present

## 2021-08-11 DIAGNOSIS — E1140 Type 2 diabetes mellitus with diabetic neuropathy, unspecified: Secondary | ICD-10-CM | POA: Diagnosis present

## 2021-08-11 DIAGNOSIS — R531 Weakness: Secondary | ICD-10-CM | POA: Diagnosis not present

## 2021-08-11 DIAGNOSIS — R4020 Unspecified coma: Secondary | ICD-10-CM | POA: Diagnosis not present

## 2021-08-11 DIAGNOSIS — Z90710 Acquired absence of both cervix and uterus: Secondary | ICD-10-CM

## 2021-08-11 DIAGNOSIS — E1165 Type 2 diabetes mellitus with hyperglycemia: Secondary | ICD-10-CM | POA: Diagnosis not present

## 2021-08-11 DIAGNOSIS — N2581 Secondary hyperparathyroidism of renal origin: Secondary | ICD-10-CM | POA: Diagnosis present

## 2021-08-11 DIAGNOSIS — R9431 Abnormal electrocardiogram [ECG] [EKG]: Secondary | ICD-10-CM | POA: Diagnosis not present

## 2021-08-11 DIAGNOSIS — Z823 Family history of stroke: Secondary | ICD-10-CM

## 2021-08-11 DIAGNOSIS — Z794 Long term (current) use of insulin: Secondary | ICD-10-CM

## 2021-08-11 DIAGNOSIS — N183 Chronic kidney disease, stage 3 unspecified: Secondary | ICD-10-CM | POA: Diagnosis not present

## 2021-08-11 DIAGNOSIS — I959 Hypotension, unspecified: Secondary | ICD-10-CM | POA: Diagnosis present

## 2021-08-11 DIAGNOSIS — Z7401 Bed confinement status: Secondary | ICD-10-CM

## 2021-08-11 DIAGNOSIS — Z803 Family history of malignant neoplasm of breast: Secondary | ICD-10-CM

## 2021-08-11 DIAGNOSIS — Z853 Personal history of malignant neoplasm of breast: Secondary | ICD-10-CM

## 2021-08-11 LAB — CBC
HCT: 48.7 % — ABNORMAL HIGH (ref 36.0–46.0)
Hemoglobin: 14.7 g/dL (ref 12.0–15.0)
MCH: 27.7 pg (ref 26.0–34.0)
MCHC: 30.2 g/dL (ref 30.0–36.0)
MCV: 91.9 fL (ref 80.0–100.0)
Platelets: 155 10*3/uL (ref 150–400)
RBC: 5.3 MIL/uL — ABNORMAL HIGH (ref 3.87–5.11)
RDW: 14.1 % (ref 11.5–15.5)
WBC: 12.8 10*3/uL — ABNORMAL HIGH (ref 4.0–10.5)
nRBC: 0 % (ref 0.0–0.2)

## 2021-08-11 LAB — CBG MONITORING, ED
Glucose-Capillary: >600 mg/dL (ref 70–99)
Glucose-Capillary: >600 mg/dL (ref 70–99)
Glucose-Capillary: >600 mg/dL (ref 70–99)
Glucose-Capillary: >600 mg/dL (ref 70–99)
Glucose-Capillary: >600 mg/dL (ref 70–99)
Glucose-Capillary: >600 mg/dL (ref 70–99)
Glucose-Capillary: 569 mg/dL (ref 70–99)
Glucose-Capillary: >600 mg/dL (ref 70–99)
Glucose-Capillary: 549 mg/dL (ref 70–99)

## 2021-08-11 LAB — I-STAT CHEM 8, ED
BUN: 61 mg/dL — ABNORMAL HIGH (ref 8–23)
Calcium, Ion: 1.2 mmol/L (ref 1.15–1.40)
Chloride: 118 mmol/L — ABNORMAL HIGH (ref 98–111)
Creatinine, Ser: 2.1 mg/dL — ABNORMAL HIGH (ref 0.44–1.00)
Glucose, Bld: >700 mg/dL (ref 70–99)
HCT: 46 % (ref 36.0–46.0)
Hemoglobin: 15.6 g/dL — ABNORMAL HIGH (ref 12.0–15.0)
Potassium: 4.7 mmol/L (ref 3.5–5.1)
Sodium: 148 mmol/L — ABNORMAL HIGH (ref 135–145)
TCO2: 22 mmol/L (ref 22–32)

## 2021-08-11 LAB — I-STAT VENOUS BLOOD GAS, ED
Acid-base deficit: 5 mmol/L — ABNORMAL HIGH (ref 0.0–2.0)
Bicarbonate: 20 mmol/L (ref 20.0–28.0)
Calcium, Ion: 1.15 mmol/L (ref 1.15–1.40)
HCT: 42 % (ref 36.0–46.0)
Hemoglobin: 14.3 g/dL (ref 12.0–15.0)
O2 Saturation: 69 %
Potassium: 4.3 mmol/L (ref 3.5–5.1)
Sodium: 149 mmol/L — ABNORMAL HIGH (ref 135–145)
TCO2: 21 mmol/L — ABNORMAL LOW (ref 22–32)
pCO2, Ven: 35.8 mmHg — ABNORMAL LOW (ref 44.0–60.0)
pH, Ven: 7.355 (ref 7.250–7.430)
pO2, Ven: 37 mmHg (ref 32.0–45.0)

## 2021-08-11 LAB — RESP PANEL BY RT-PCR (FLU A&B, COVID) ARPGX2
Influenza A by PCR: NEGATIVE
Influenza B by PCR: NEGATIVE
SARS Coronavirus 2 by RT PCR: NEGATIVE

## 2021-08-11 LAB — COMPREHENSIVE METABOLIC PANEL
ALT: 38 U/L (ref 0–44)
AST: 45 U/L — ABNORMAL HIGH (ref 15–41)
Albumin: 4.1 g/dL (ref 3.5–5.0)
Alkaline Phosphatase: 97 U/L (ref 38–126)
Anion gap: 17 — ABNORMAL HIGH (ref 5–15)
BUN: 63 mg/dL — ABNORMAL HIGH (ref 8–23)
CO2: 17 mmol/L — ABNORMAL LOW (ref 22–32)
Calcium: 9.7 mg/dL (ref 8.9–10.3)
Chloride: 111 mmol/L (ref 98–111)
Creatinine, Ser: 2.3 mg/dL — ABNORMAL HIGH (ref 0.44–1.00)
GFR, Estimated: 21 mL/min — ABNORMAL LOW (ref >60)
Glucose, Bld: 982 mg/dL (ref 70–99)
Potassium: 4.5 mmol/L (ref 3.5–5.1)
Sodium: 145 mmol/L (ref 135–145)
Total Bilirubin: 0.8 mg/dL (ref 0.3–1.2)
Total Protein: 7.4 g/dL (ref 6.5–8.1)

## 2021-08-11 LAB — DIFFERENTIAL
Abs Immature Granulocytes: 0.09 10*3/uL — ABNORMAL HIGH (ref 0.00–0.07)
Basophils Absolute: 0 10*3/uL (ref 0.0–0.1)
Basophils Relative: 0 %
Eosinophils Absolute: 0 10*3/uL (ref 0.0–0.5)
Eosinophils Relative: 0 %
Immature Granulocytes: 1 %
Lymphocytes Relative: 10 %
Lymphs Abs: 1.3 10*3/uL (ref 0.7–4.0)
Monocytes Absolute: 0.8 10*3/uL (ref 0.1–1.0)
Monocytes Relative: 7 %
Neutro Abs: 10.5 10*3/uL — ABNORMAL HIGH (ref 1.7–7.7)
Neutrophils Relative %: 82 %

## 2021-08-11 LAB — BETA-HYDROXYBUTYRIC ACID
Beta-Hydroxybutyric Acid: 0.7 mmol/L — ABNORMAL HIGH (ref 0.05–0.27)
Beta-Hydroxybutyric Acid: 0.3 mmol/L — ABNORMAL HIGH (ref 0.05–0.27)

## 2021-08-11 LAB — BASIC METABOLIC PANEL
Anion gap: 15 (ref 5–15)
BUN: 59 mg/dL — ABNORMAL HIGH (ref 8–23)
CO2: 18 mmol/L — ABNORMAL LOW (ref 22–32)
Calcium: 9.3 mg/dL (ref 8.9–10.3)
Chloride: 115 mmol/L — ABNORMAL HIGH (ref 98–111)
Creatinine, Ser: 2.1 mg/dL — ABNORMAL HIGH (ref 0.44–1.00)
GFR, Estimated: 24 mL/min — ABNORMAL LOW (ref >60)
Glucose, Bld: 781 mg/dL (ref 70–99)
Potassium: 4 mmol/L (ref 3.5–5.1)
Sodium: 148 mmol/L — ABNORMAL HIGH (ref 135–145)

## 2021-08-11 LAB — URINALYSIS, ROUTINE W REFLEX MICROSCOPIC
Bacteria, UA: NONE SEEN
Bilirubin Urine: NEGATIVE
Glucose, UA: >=500 mg/dL — AB
Ketones, ur: NEGATIVE mg/dL
Leukocytes,Ua: NEGATIVE
Nitrite: NEGATIVE
Protein, ur: 30 mg/dL — AB
Specific Gravity, Urine: 1.029 (ref 1.005–1.030)
pH: 5 (ref 5.0–8.0)

## 2021-08-11 LAB — GLUCOSE, CAPILLARY
Glucose-Capillary: 373 mg/dL — ABNORMAL HIGH (ref 70–99)
Glucose-Capillary: 433 mg/dL — ABNORMAL HIGH (ref 70–99)
Glucose-Capillary: 277 mg/dL — ABNORMAL HIGH (ref 70–99)
Glucose-Capillary: 180 mg/dL — ABNORMAL HIGH (ref 70–99)

## 2021-08-11 LAB — PROTIME-INR
INR: 1.1 (ref 0.8–1.2)
Prothrombin Time: 14.2 seconds (ref 11.4–15.2)

## 2021-08-11 LAB — TROPONIN I (HIGH SENSITIVITY)
Troponin I (High Sensitivity): 22 ng/L — ABNORMAL HIGH (ref <18)
Troponin I (High Sensitivity): 24 ng/L — ABNORMAL HIGH (ref <18)

## 2021-08-11 LAB — APTT: aPTT: 26 seconds (ref 24–36)

## 2021-08-11 MED ORDER — ONDANSETRON HCL 4 MG/2ML IJ SOLN
4.0000 mg | Freq: Four times a day (QID) | INTRAMUSCULAR | Status: DC | PRN
  Filled 2021-08-11: qty 2

## 2021-08-11 MED ORDER — HALOPERIDOL LACTATE 5 MG/ML IJ SOLN
5.0000 mg | Freq: Four times a day (QID) | INTRAMUSCULAR | Status: DC | PRN
  Administered 2021-08-11 – 2021-08-12 (×2): 5 mg via INTRAVENOUS
  Filled 2021-08-11 (×2): qty 1

## 2021-08-11 MED ORDER — ENOXAPARIN SODIUM 30 MG/0.3ML IJ SOSY
30.0000 mg | PREFILLED_SYRINGE | INTRAMUSCULAR | Status: DC
  Administered 2021-08-12 – 2021-08-13 (×2): 30 mg via SUBCUTANEOUS
  Filled 2021-08-11 (×2): qty 0.3

## 2021-08-11 MED ORDER — SODIUM CHLORIDE 0.9% FLUSH: 3.0000 mL | Freq: Once | INTRAVENOUS | Status: DC

## 2021-08-11 MED ORDER — HYDRALAZINE HCL 20 MG/ML IJ SOLN: 5.0000 mg | INTRAMUSCULAR | Status: DC | PRN

## 2021-08-11 MED ORDER — DEXTROSE IN LACTATED RINGERS 5 % IV SOLN: INTRAVENOUS | Status: DC

## 2021-08-11 MED ORDER — SODIUM CHLORIDE 0.9 % IV BOLUS
1000.0000 mL | Freq: Once | INTRAVENOUS | Status: AC
  Administered 2021-08-11: 1000 mL via INTRAVENOUS

## 2021-08-11 MED ORDER — ASPIRIN 300 MG RE SUPP
300.0000 mg | Freq: Every day | RECTAL | Status: DC
  Administered 2021-08-11 – 2021-08-15 (×5): 300 mg via RECTAL
  Filled 2021-08-11 (×6): qty 1

## 2021-08-11 MED ORDER — LACTATED RINGERS IV BOLUS
1000.0000 mL | INTRAVENOUS | Status: AC
  Administered 2021-08-11 (×2): 1000 mL via INTRAVENOUS

## 2021-08-11 MED ORDER — INSULIN REGULAR(HUMAN) IN NACL 100-0.9 UT/100ML-% IV SOLN
INTRAVENOUS | Status: DC
  Administered 2021-08-11 (×2): 9.5 [IU]/h via INTRAVENOUS
  Filled 2021-08-11: qty 100

## 2021-08-11 MED ORDER — INSULIN ASPART 100 UNIT/ML IJ SOLN
10.0000 [IU] | Freq: Once | INTRAMUSCULAR | Status: AC
Start: 2021-08-11 — End: 2021-08-11
  Administered 2021-08-11: 10 [IU] via INTRAVENOUS

## 2021-08-11 MED ORDER — STROKE: EARLY STAGES OF RECOVERY BOOK: Freq: Once | Status: DC

## 2021-08-11 MED ORDER — POTASSIUM CHLORIDE 10 MEQ/100ML IV SOLN
10.0000 meq | INTRAVENOUS | Status: AC
  Administered 2021-08-11 (×2): 10 meq via INTRAVENOUS
  Filled 2021-08-11 (×2): qty 100

## 2021-08-11 MED ORDER — DEXTROSE 50 % IV SOLN: 0.0000 mL | INTRAVENOUS | Status: DC | PRN

## 2021-08-11 MED ORDER — ENOXAPARIN SODIUM 40 MG/0.4ML IJ SOSY: 40.0000 mg | PREFILLED_SYRINGE | INTRAMUSCULAR | Status: DC

## 2021-08-11 MED ORDER — LACTATED RINGERS IV SOLN: INTRAVENOUS | Status: DC

## 2021-08-11 NOTE — Assessment & Plan Note (Signed)
Continue CPAP.  

## 2021-08-11 NOTE — Assessment & Plan Note (Addendum)
-  Patient presenting with acute onset of AMS -Niece reports that this may have been a vasovagal episode, as she has had these in the past; however, she has not returned to baseline -There was concern for UTI earlier in the week; UA and culture are pending -She also has mild DKA -Code stroke was called on presentation, but she is unable to participate in neurologic evaluation effectively; head CT showed new small region of edema/possible infarct in the L hippocampus -This may be c/w stroke or could also suggest seizure; see below, but based on various other considerations at this time in conjunction with late dementia status on hospice, after discussion with niece and Dr. Leonel Ramsay an MRI would be unlikely to affect her outcomes at this time and so will hold -Will request EEG -Will admit to progressive care for close ongoing monitoring, but if she does not improve then transition to comfort care will be considered

## 2021-08-11 NOTE — Progress Notes (Signed)
Pt. Refused cpap. 

## 2021-08-11 NOTE — Consult Note (Signed)
Neurology Consultation  Reason for Consult: Code Stroke Referring Physician: Dr. Melina Copa  CC: Episode of unresponsiveness at home  History is obtained from: EMS, niece (caregiver) at bedside, chart review  HPI: Julia Banks is a 81 y.o. female with a medical history significant for coronary artery disease, CKD stage III, left breast cancer s/p mastectomy, hyperlipidemia, essential hypertension, sensorineural hearing loss bilaterally, OSA on CPAP, uncontrolled type 2 diabetes, and right basal ganglia CVA in 2020 with residual cognitive impairment and left sided spastic hemiparesis.  Patient is on hospice due to progressive decline since her stroke 2 years ago with a more rapid decline over the past month with decreased appetite.  Patient lives with her niece who provides 24/7 care with the help of hospice.  Family reports that Julia Banks woke up in her usual state of health this morning and had breakfast without incident.  The nursing assistant felt that her left side was weaker than usual but attempted to put her on a bedside commode and give her a bath.  When she was on the bedside commode the patient went unresponsive with loss of consciousness and family indicates that this could have lasted up to 30 minutes.  On EMS arrival patient's initial blood pressures were 90s / 60s and she was minimally responsive to pain with improvement with supine positioning.  Patient's niece who is an NP also states that she may have had an aspiration event about 1 week ago and notes that over the past week she has had increased urinary frequency and polyuria.  Initial blood glucose on EMS arrival was noted to be 550 and on ED arrival the monitor read "high".  LKW: 9:10 this morning  TNK given?: no, after discussing with patient's niece (caregiver and POA) it was communicated that the patient would not want any invasive procedures or treatments and she is currently on hospice care with a rapid decline and poor mRs  baseline.  IR Thrombectomy? No, IR thrombectomy would not align with patient's goals of care per patient's niece at bedside.  Modified Rankin Scale: 5-Severe disability-bedridden, incontinent, needs constant attention  ROS: Unable to obtain due to altered mental status.   Past Medical History:  Diagnosis Date   Arthritis    Asthma    Breast cancer, left breast ([place]) 02/2014   DCIS, ER+ PR- initially rec bilat mastectomy with sentinal LN biopsy L axilla but now planning just L mastectomy [name])   Chronic kidney disease, stage 3 (HCC)    Colon polyps    benign   Complication of anesthesia    hard to wake up   Coronary artery disease    30% stenosis reported on previous cardiac catheterization in 2006. This was done in [facility]   COVID-19 virus infection 07/2019   GERD (gastroesophageal reflux disease)    Headaches, cluster    Hyperlipidemia    Hypertension    Hypothyroidism    Osteopenia 07/2014   DEXA 0.5 spine, 01.7 R femoral neck   Pneumonia    years ago   Secondary hyperparathyroidism of renal origin [facility])    likely   Sensorineural hearing loss (SNHL) of both ears 01/27/2016   Audiology eval - moderately severe sensorineural hearing loss bilaterally rec amplification (07/2016 - [name] audiologist)   Sleep apnea    on CPAP   Thyroid disease    Type 2 diabetes, uncontrolled, with neuropathy ([place])    followed by endo Dr Buddy Duty   Past Surgical  History:  Procedure Laterality Date   ABDOMINAL HYSTERECTOMY     heavy bleeding, ovaries remain   APPENDECTOMY     BREAST BIOPSY Right    benign   BREAST BIOPSY Left 02/2014   ?DCIS   CARDIAC CATHETERIZATION  2006   [place] CATHETERIZATION  10/2013   mild 2v nonobstructive disease, EF 60%   CARDIOVASCULAR STRESS TEST  09/2012   WNL ([place])   CHOLECYSTECTOMY     COLONOSCOPY  05/2010   2 TAs, diverticulosis, rpt 2 yrs (Dr Elissa Hefty in [place])   COLONOSCOPY  12/2014   1 TA,  rpt 5 yrs [facility] Pastor)   cortisone right knee  01/04/15   GALLBLADDER SURGERY     MASTECTOMY W/ SENTINEL NODE BIOPSY Left 06/07/2014   DR WAKEFIELD   SIMPLE MASTECTOMY WITH AXILLARY SENTINEL NODE BIOPSY Left 06/07/2014   Procedure: LEFT TOTAL MASTECTOMY WITH LEFT AXILLARY SENTINEL NODE BIOPSY;  Surgeon: Rolm Bookbinder, MD;  Location: [facility];  Service: General;  Laterality: Left;   Family History  Problem Relation Age of Onset   Arthritis Mother    Hyperlipidemia Mother    Stroke Mother    Hypertension Mother    Diabetes Mother    Other Mother        parathyroid adenoma   Arthritis Father    Hyperlipidemia Father    CAD Father        MI   Hypertension Father    Breast cancer Other    Ovarian cancer Other    Leukemia Brother        dx in his 9s   Colon cancer Maternal Uncle        dx in his 32s   Ovarian cancer Maternal Grandmother 90   Melanoma Other 9       dx 2x - 6 and 29/dx 3x - ages 91, 63, and 24   Cancer Neg Hx    Crohn's disease Neg Hx    Colon polyps Neg Hx    Ulcerative colitis Neg Hx    Stomach cancer Neg Hx    Rectal cancer Neg Hx    Social History:   reports that she has never smoked. She has never used smokeless tobacco. She reports that she does not drink alcohol and does not use drugs.  Medications  Current Facility-Administered Medications:    insulin aspart (novoLOG) injection 10 Units, 10 Units, Intravenous, Once, Hayden Rasmussen, MD   sodium chloride 0.9 % bolus 1,000 mL, 1,000 mL, Intravenous, Once, Hayden Rasmussen, MD   sodium chloride flush (NS) 0.9 % injection 3 mL, 3 mL, Intravenous, Once, Hayden Rasmussen, MD  Current Outpatient Medications:    acetaminophen (TYLENOL) 325 MG tablet, Take 2 tablets (650 mg total) by mouth 3 (three) times daily as needed for mild pain (or temp > 37.5 C (99.5 F))., Disp: 30 tablet, Rfl: 0   ADVAIR DISKUS 250-50 MCG/DOSE AEPB, TAKE 1 PUFF BY MOUTH TWICE A DAY, Disp: 60 each, Rfl: 2   albuterol (VENTOLIN HFA)  108 (90 Base) MCG/ACT inhaler, TAKE 2 PUFFS BY MOUTH EVERY 6 HOURS AS NEEDED FOR WHEEZE OR SHORTNESS OF BREATH, Disp: 8.5 each, Rfl: 3   amLODipine (NORVASC) 5 MG tablet, Take 1 tablet by mouth daily., Disp: , Rfl:    amoxicillin-clavulanate (AUGMENTIN) 875-125 MG tablet, Take 1 tablet by mouth 2 (two) times daily., Disp: 20 tablet, Rfl: 0   Ascorbic Acid (VITAMIN C WITH ROSE HIPS) 500 MG tablet,  Take 500 mg by mouth daily., Disp: , Rfl:    aspirin EC 81 MG EC tablet, Take 1 tablet (81 mg total) by mouth daily., Disp: 30 tablet, Rfl: 2   baclofen (LIORESAL) 10 MG tablet, Take 1 tablet (10 mg total) by mouth at bedtime., Disp: 30 each, Rfl: 11   benzonatate (TESSALON) 100 MG capsule, Take 1 capsule (100 mg total) by mouth 3 (three) times daily as needed for cough., Disp: 30 capsule, Rfl: 0   carvedilol (COREG) 6.25 MG tablet, TAKE 1 TABLET BY MOUTH 2 (TWO) TIMES DAILY. PLEASE SCHEDULE OFFICE VISIT FOR FURTHER REFILLS. THANK YOU!, Disp: 60 tablet, Rfl: 0   Cholecalciferol (VITAMIN D3) 1.25 MG (50000 UT) TABS, Take 1 tablet by mouth once a week., Disp: 12 tablet, Rfl: 3   ezetimibe (ZETIA) 10 MG tablet, Take 1 tablet (10 mg total) by mouth daily., Disp: 90 tablet, Rfl: 3   famotidine (PEPCID) 20 MG tablet, TAKE 1 TABLET BY MOUTH EVERYDAY AT BEDTIME, Disp: 90 tablet, Rfl: 1   hydrochlorothiazide (MICROZIDE) 12.5 MG capsule, Take 1 capsule (12.5 mg total) by mouth daily., Disp: 90 capsule, Rfl: 3   insulin glargine (LANTUS) 100 UNIT/ML injection, Inject 0.42 mLs (42 Units total) into the skin daily., Disp: 40 mL, Rfl: 3   Insulin Pen Needle (BD PEN NEEDLE NANO U/F) 32G X 4 MM MISC, USE 1 AS DIRECTED TO INJECT MEDICATION DAILY., Disp: 100 each, Rfl: 3   Insulin Syringe-Needle U-100 31G X 15/64" 1 ML MISC, Use as instructed to administer insulin daily., Disp: 100 each, Rfl: 3   isosorbide mononitrate (IMDUR) 30 MG 24 hr tablet, Take 1 tablet by mouth every 8 (eight) hours as needed., Disp: , Rfl:     levothyroxine (SYNTHROID) 50 MCG tablet, Take 1 tablet (50 mcg total) by mouth daily before breakfast., Disp: 90 tablet, Rfl: 3   losartan (COZAAR) 100 MG tablet, Take 1 tablet (100 mg total) by mouth daily., Disp: 90 tablet, Rfl: 3   melatonin 3 MG TABS tablet, Take 1 tablet (3 mg total) by mouth at bedtime., Disp: , Rfl: 0   memantine (NAMENDA TITRATION PAK) tablet pack, 5 mg/day for =1 week; 5 mg twice daily for =1 week; 15 mg/day given in 5 mg and 10 mg separated doses for =1 week; then 10 mg twice daily, Disp: 49 tablet, Rfl: 0   memantine (NAMENDA) 10 MG tablet, Take 1 tablet (10 mg total) by mouth 2 (two) times daily. Start after completion of titration pack, Disp: 60 tablet, Rfl: 5   metoprolol succinate (TOPROL-XL) 100 MG 24 hr tablet, Take 1 tablet by mouth daily., Disp: , Rfl:    potassium chloride SA (KLOR-CON) 20 MEQ tablet, Take 1 tablet by mouth daily., Disp: , Rfl:    QUEtiapine (SEROQUEL) 50 MG tablet, Take 1 tablet (50 mg total) by mouth in the morning AND 1.5 tablets (75 mg total) at bedtime., Disp: 225 tablet, Rfl: 3   rosuvastatin (CRESTOR) 5 MG tablet, Take 1 tablet (5 mg total) by mouth daily., Disp: 90 tablet, Rfl: 3   sertraline (ZOLOFT) 100 MG tablet, Take 1 tablet (100 mg total) by mouth at bedtime., Disp: 90 tablet, Rfl: 3   Vitamin D, Ergocalciferol, (DRISDOL) 1.25 MG (50000 UNIT) CAPS capsule, Take 1 capsule by mouth once a week., Disp: , Rfl:   Exam: Current vital signs: BP (!) 152/83    Pulse 60    Temp 97.9 F (36.6 C) (Oral)    Resp 13  Ht 5\' 6"  (1.676 m)    Wt 74.2 kg    SpO2 96%    BMI 26.40 kg/m  Vital signs in last 24 hours: Weight:  [74.2 kg] 74.2 kg (02/12 1244)  GENERAL: Awake, alert, in no acute distress Psych: Patient is calm and cooperative with examination, flat affect noted Head: Normocephalic and atraumatic, without obvious abnormality EENT: Normal conjunctivae, dry mucous membranes, no OP obstruction LUNGS: Normal respiratory effort.  Non-labored breathing on room air CV: Regular rate and rhythm on telemetry ABDOMEN: Soft, rounded, non-tender Extremities: Warm, well perfused, without obvious deformity  NEURO:  Mental Status: Awake, alert, and oriented to self and place. She does not answer the month or the year on assessment. Poor attention noted.  She speaks at a whisper in 1-2 word responses that are mostly inaudible to examiner.  She is unable to provide a clear and coherent history of present illness but is able to answer some yes/no questions appropriately. Speech/Language: speech is nonfluent and hypophonic as above.  She is able to name objects without difficulty.  Cranial Nerves:  II: PERRL. Visual fields full.  III, IV, VI: EOMI. Initially, patient seems to prefer a rightward gaze but on reassessment, she crosses midline fully towards the left. V: Blinks to threat throughout.  VII: Face is symmetric resting and with movement   VIII: Hearing intact to loud voice IX, X: Hypophonia present.  XI: Head is grossly midline.  XII: Tongue protrudes midline without fasciculations.   Motor: Patient has chronic spastic hemiparesis on the left upper and lower extremities with some movement without gravity.  Patient is able to elevate the right upper and lower extremities antigravity briefly on the right with drift to bed.  Sensation: Reacts to noxious stimuli in extremities throughout. No extinction to DSS present.  Coordination: Does not perform  Gait: Deferred as patient is nonambulatory at baseline   NIHSS: 1a Level of Conscious.: 0 1b LOC Questions: 2 1c LOC Commands: 1 2 Best Gaze: 0 3 Visual: 0 4 Facial Palsy: 0 5a Motor Arm - left: 3 5b Motor Arm - Right: 2 6a Motor Leg - Left: 3 6b Motor Leg - Right: 2 7 Limb Ataxia: 0 8 Sensory: 0 9 Best Language: 0 10 Dysarthria: 0 11 Extinct. and Inatten.: 0 TOTAL: 13  Labs I have reviewed labs in epic and the results pertinent to this consultation are: CBC     Component Value Date/Time   WBC 7.9 10/04/2020 0812   RBC 4.65 10/04/2020 0812   HGB 15.6 (H) 08/11/2021 1237   HGB 12.0 07/27/2019 1344   HGB 13.0 04/05/2014 0820   HCT 46.0 08/11/2021 1237   HCT 34.8 07/27/2019 1344   HCT 39.0 04/05/2014 0820   PLT 187.0 10/04/2020 0812   PLT 198 07/27/2019 1344   MCV 83.8 10/04/2020 0812   MCV 88 07/27/2019 1344   MCV 86.7 04/05/2014 0820   MCH 30.2 07/27/2019 1344   MCH 30.2 03/21/2019 0609   MCHC 34.1 10/04/2020 0812   RDW 15.2 10/04/2020 0812   RDW 13.3 07/27/2019 1344   RDW 14.1 04/05/2014 0820   LYMPHSABS 2.1 10/04/2020 0812   LYMPHSABS 1.7 07/27/2019 1344   LYMPHSABS 2.2 04/05/2014 0820   MONOABS 0.4 10/04/2020 0812   MONOABS 0.5 04/05/2014 0820   EOSABS 0.5 10/04/2020 0812   EOSABS 0.6 (H) 07/27/2019 1344   BASOSABS 0.0 10/04/2020 0812   BASOSABS 0.0 07/27/2019 1344   BASOSABS 0.1 04/05/2014 0820   CMP  Component Value Date/Time   NA 148 (H) 08/11/2021 1237   NA 145 (H) 07/27/2019 1344   NA 140 04/02/2015 1447   K 4.7 08/11/2021 1237   K 4.3 04/02/2015 1447   CL 118 (H) 08/11/2021 1237   CO2 28 10/04/2020 0812   CO2 27 04/02/2015 1447   GLUCOSE >700 (HH) 08/11/2021 1237   GLUCOSE 96 04/02/2015 1447   BUN 61 (H) 08/11/2021 1237   BUN 34 (H) 07/27/2019 1344   BUN 15.3 04/02/2015 1447   CREATININE 2.10 (H) 08/11/2021 1237   CREATININE 1.3 (H) 04/02/2015 1447   CALCIUM 9.5 10/04/2020 0812   CALCIUM 9.5 04/02/2015 1447   PROT 6.8 10/04/2020 0812   PROT 6.4 07/27/2019 1344   PROT 6.6 04/02/2015 1447   ALBUMIN 3.9 10/04/2020 0812   ALBUMIN 3.9 07/27/2019 1344   ALBUMIN 4.0 04/02/2015 1447   AST 12 10/04/2020 0812   AST 20 04/02/2015 1447   ALT 9 10/04/2020 0812   ALT 17 04/02/2015 1447   ALKPHOS 85 10/04/2020 0812   ALKPHOS 85 04/02/2015 1447   BILITOT 0.4 10/04/2020 0812   BILITOT 0.3 07/27/2019 1344   BILITOT 0.67 04/02/2015 1447   GFRNONAA 30 (L) 07/27/2019 1344   GFRAA 35 (L) 07/27/2019 1344   Lipid  Panel     Component Value Date/Time   CHOL 323 (H) 10/04/2020 0812   CHOL 149 07/27/2019 1344   TRIG 383.0 (H) 10/04/2020 0812   HDL 39.50 10/04/2020 0812   HDL 39 (L) 07/27/2019 1344   CHOLHDL 8 10/04/2020 0812   VLDL 76.6 (H) 10/04/2020 0812   LDLCALC 73 07/27/2019 1344   LDLDIRECT 185.0 10/04/2020 1660   Lab Results  Component Value Date   HGBA1C 7.2 (H) 10/04/2020   Imaging I have reviewed the images obtained:  CT-scan of the brain 2/12: 1. New small region of edema/possible infarct in the left hippocampus. 2. Chronic small vessel ischemia with old white matter infarcts.  Assessment: 81 y.o. female who presented to the ED as a Code Stroke for worsening of baseline left-sided weakness and a period of unresponsiveness at home this morning with improvement with supine positioning. Presentation is felt to be consistent with hypoperfusion (possibly due to hypovolemia 2/2 polyuria with blood glucose elevation on arrival) versus acute ischemic stroke. Low suspicion for seizure at this time. Patient's caregiver at bedside verbalized that patient would not want any invasive procedures as part of her goals of care, therefore, TNK was not administered and patient is not a candidate for IR.  Recommendations: - UA - Medical management per primary/EDP  Pt seen by NP/Neuro and later by MD. Note/plan to be edited by MD as needed.  Anibal Henderson, AGAC-NP [facility] Pager: [phone]  I have seen the patient reviewed the above note.  I wonder if she developed polyuria/orthostasis due to her hyperglycemia.  Her family held her in a sitting position and she remained decreased responsiveness, she was found to have a low blood pressure and improved considerably after being moved to a supine position by EMS.  She does appear to have a questionable stroke on CT, but given that she has requested limited medical interventions in the setting of hospice, could discuss with family how  aggressive any work-up needs to be.  Given her decreased responsiveness, I would favor getting an EEG, as if this is positive then I would add antiepileptics as this could contribute to comfort measures.  EEG, if negative then neurology will sign off.  Roland Rack, MD [facility] [phone]  If 7pm- 7am, please page neurology on call as listed in [place].

## 2021-08-11 NOTE — Assessment & Plan Note (Signed)
-  She previously failed CIR and was recommended for SNF vs home with 24/7 caregivers; family opted for the latter -Based on this situation, she continues to have marked L hemiparesis with contractures -She is unlikely to qualify for CIR at this point and family prefers to care for her at home vs. SNF -As such, MRI deferred at this time despite possible recurrent CVA -EEG was ordered to evaluate for seizure -Stroke order set was used to include therapy evaluations -For now, rectal ASA given concern for dysphagia

## 2021-08-11 NOTE — Assessment & Plan Note (Addendum)
-  Most recent LDL was 185 (eek!) -Unfortunately, she has statin and Zetia intolerance -Could consider statin clinic f/u depending on outcome of current hospitalization

## 2021-08-11 NOTE — Code Documentation (Signed)
Stroke Response Nurse Documentation Code Documentation  KARRISSA PARCHMENT is a 81 y.o. female arriving to [facility] ED via [facility] on 08/11/21  with past medical hx of breast cancer, CKD 3, CAD, HLD, thyroid disease, DM2. On aspirin 81 mg daily. Code stroke was activated by EMS (EMS, ED).   Patient from home where she was LKW at [phone] and now complaining of left sided weakness and an episode of unresponsiveness.  Stroke team at the bedside on patient arrival. Labs drawn and patient cleared for CT by Dr. Melina Copa. Patient to CT with team. NIHSS 13, see documentation for details and code stroke times. Patient with disoriented, bilateral arm weakness, and bilateral leg weakness on exam. The following imaging was completed:  CT (CT, CTA head and neck, CTP). Patient is not a candidate for IV Thrombolytic due to patients stated wishes for noninvasive treatment or thrombolytic. Patient is currently a hospice patient.  Care/Plan:VS and neuro checks q2h x 12 hours then q4h   Bedside handoff with ED RN [place] RN

## 2021-08-11 NOTE — ED Triage Notes (Signed)
Pt BIB [facility] for Code Stroke. Pt lives at home with family, niece as caregiver. Pt ate breakfast this AM, LSN 0910, CNA was giving her a bath and noticed the pt became weak and went unresponsive. L sided deficits from previous stroke 2 years ago. Pt is a Hospice pt, DNR/DNI.   EMS VS- HR 66, BP 134/76, CBG 550

## 2021-08-11 NOTE — Assessment & Plan Note (Addendum)
-  She has been declining since her stroke in 2020 -Her niece reports recent decrease in function, appetite - concern for more accentuated decline -She enrolled in hospice in 12/2020 -She is taking Zoloft and Seroquel but does not currently appear capable of effectively swallowing so will hold -Will add prn IV Haldol -Delirium precautions

## 2021-08-11 NOTE — Assessment & Plan Note (Signed)
-  Patient with decreasing PO intake and so insulin dose has been lowered -She was altered with glucose >500 at home; AMS may be associated with this issue -No indication of illness as source, but UTI is a consideration -Mild DKA on admission based on HCO3 17, anion gap 17, patient drowsy/altered -Will admit to SDU with DKA protocol -Would recommend continuing insulin drip at least until morning regardless of rapidity of closure of gap and normalization of labs (and also because I am concerned about aspiration risk and would not feel comfortable giving her PO intake until speech therapy has evaluated) -K+ slightly decreased at time of presentation and so potassium supplementation added -Will give an additional 2L IVF at this time -IVF at 150 cc/hr, LR until glucose <250 and then decrease rate to 125 and change to [facility]

## 2021-08-11 NOTE — Assessment & Plan Note (Signed)
-  Resume Synthroid when safely able

## 2021-08-11 NOTE — ED Provider Notes (Signed)
[facility] EMERGENCY DEPARTMENT Provider Note   CSN: [id number] Arrival date & time: 08/11/21  1228     History  No chief complaint on file.   Julia Banks is a 81 y.o. female.  She is presenting by EMS as a code stroke activation.  Diabetic prior history of stroke with residual left-sided deficits.  Last known well was 9:10 AM today.  Per EMS patient was with her CNA when she acutely became unresponsive.  EMS found her responding to painful stimuli and ultimately being able to speak again.  Left-sided deficits possibly more pronounced than prior.  No reported seizure activity.  EMS states that family was worried about UTI and aspiration last week.  The history is provided by the patient and the EMS personnel.  Altered Mental Status Presenting symptoms: unresponsiveness   Most recent episode:  Today Episode history:  Single Progression:  Partially resolved Chronicity:  New Associated symptoms: no seizures       Home Medications Prior to Admission medications   Medication Sig Start Date End Date Taking? Authorizing Provider  acetaminophen (TYLENOL) 325 MG tablet Take 2 tablets (650 mg total) by mouth 3 (three) times daily as needed for mild pain (or temp > 37.5 C (99.5 F)). 06/05/20   Ria Bush, MD  ADVAIR DISKUS 250-50 MCG/DOSE AEPB TAKE 1 PUFF BY MOUTH TWICE A DAY 06/15/20   Ria Bush, MD  albuterol (VENTOLIN HFA) 108 (90 Base) MCG/ACT inhaler TAKE 2 PUFFS BY MOUTH EVERY 6 HOURS AS NEEDED FOR WHEEZE OR SHORTNESS OF BREATH 04/09/20   Ria Bush, MD  amLODipine (NORVASC) 5 MG tablet Take 1 tablet by mouth daily. 09/25/11   [provider]  amoxicillin-clavulanate (AUGMENTIN) 875-125 MG tablet Take 1 tablet by mouth 2 (two) times daily. 11/06/20   Brunetta Jeans, PA-C  Ascorbic Acid (VITAMIN C WITH ROSE HIPS) 500 MG tablet Take 500 mg by mouth daily.    [provider]  aspirin EC 81 MG EC tablet Take 1 tablet (81 mg  total) by mouth daily. 03/10/19   Hosie Poisson, MD  baclofen (LIORESAL) 10 MG tablet Take 1 tablet (10 mg total) by mouth at bedtime. 10/31/20   Frann Rider, NP  benzonatate (TESSALON) 100 MG capsule Take 1 capsule (100 mg total) by mouth 3 (three) times daily as needed for cough. 11/06/20   Brunetta Jeans, PA-C  carvedilol (COREG) 6.25 MG tablet TAKE 1 TABLET BY MOUTH 2 (TWO) TIMES DAILY. PLEASE SCHEDULE OFFICE VISIT FOR FURTHER REFILLS. THANK YOU! 06/13/21   Wellington Hampshire, MD  Cholecalciferol (VITAMIN D3) 1.25 MG (50000 UT) TABS Take 1 tablet by mouth once a week. 10/10/20   Ria Bush, MD  ezetimibe (ZETIA) 10 MG tablet Take 1 tablet (10 mg total) by mouth daily. 11/22/20   Ria Bush, MD  famotidine (PEPCID) 20 MG tablet TAKE 1 TABLET BY MOUTH EVERYDAY AT BEDTIME 06/27/20   Ria Bush, MD  hydrochlorothiazide (MICROZIDE) 12.5 MG capsule Take 1 capsule (12.5 mg total) by mouth daily. 10/13/20   Ria Bush, MD  insulin glargine (LANTUS) 100 UNIT/ML injection Inject 0.42 mLs (42 Units total) into the skin daily. 08/14/20   Ria Bush, MD  Insulin Pen Needle (BD PEN NEEDLE NANO U/F) 32G X 4 MM MISC USE 1 AS DIRECTED TO INJECT MEDICATION DAILY. 05/30/19   Ria Bush, MD  Insulin Syringe-Needle U-100 31G X 15/64" 1 ML MISC Use as instructed to administer insulin daily. 08/14/20   Ria Bush,  MD  isosorbide mononitrate (IMDUR) 30 MG 24 hr tablet Take 1 tablet by mouth every 8 (eight) hours as needed. 09/25/11   [provider]  levothyroxine (SYNTHROID) 50 MCG tablet Take 1 tablet (50 mcg total) by mouth daily before breakfast. 10/10/20   Ria Bush, MD  losartan (COZAAR) 100 MG tablet Take 1 tablet (100 mg total) by mouth daily. 10/13/20   Ria Bush, MD  melatonin 3 MG TABS tablet Take 1 tablet (3 mg total) by mouth at bedtime. 10/31/20   Frann Rider, NP  memantine (NAMENDA TITRATION PAK) tablet pack 5 mg/day for =1 week; 5 mg  twice daily for =1 week; 15 mg/day given in 5 mg and 10 mg separated doses for =1 week; then 10 mg twice daily 10/31/20 11/30/20  Frann Rider, NP  memantine (NAMENDA) 10 MG tablet Take 1 tablet (10 mg total) by mouth 2 (two) times daily. Start after completion of titration pack 11/30/20   Frann Rider, NP  metoprolol succinate (TOPROL-XL) 100 MG 24 hr tablet Take 1 tablet by mouth daily. 09/25/11   [provider]  potassium chloride SA (KLOR-CON) 20 MEQ tablet Take 1 tablet by mouth daily. 09/25/11   [provider]  QUEtiapine (SEROQUEL) 50 MG tablet Take 1 tablet (50 mg total) by mouth in the morning AND 1.5 tablets (75 mg total) at bedtime. 06/04/20   Frann Rider, NP  rosuvastatin (CRESTOR) 5 MG tablet Take 1 tablet (5 mg total) by mouth daily. 11/22/20 02/20/21  Furth, Cadence H, PA-C  sertraline (ZOLOFT) 100 MG tablet Take 1 tablet (100 mg total) by mouth at bedtime. 10/31/20   Frann Rider, NP  Vitamin D, Ergocalciferol, (DRISDOL) 1.25 MG (50000 UNIT) CAPS capsule Take 1 capsule by mouth once a week. 10/10/20   [provider]      Allergies    Statins, Altace [ramipril], Aspirin, and Metformin and related    Review of Systems   Review of Systems  Unable to perform ROS: Mental status change  Neurological:  Negative for seizures.   Physical Exam Updated Vital Signs There were no vitals taken for this visit. Physical Exam Vitals and nursing note reviewed.  Constitutional:      General: She is not in acute distress.    Appearance: Normal appearance. She is well-developed.  HENT:     Head: Normocephalic and atraumatic.  Eyes:     Conjunctiva/sclera: Conjunctivae normal.  Cardiovascular:     Rate and Rhythm: Normal rate and regular rhythm.     Heart sounds: No murmur heard. Pulmonary:     Effort: Pulmonary effort is normal. No respiratory distress.     Breath sounds: Normal breath sounds.  Abdominal:     Palpations: Abdomen is soft.     Tenderness: There  is no abdominal tenderness.  Musculoskeletal:        General: No swelling.     Cervical back: Neck supple.  Skin:    General: Skin is warm and dry.     Capillary Refill: Capillary refill takes less than 2 seconds.  Neurological:     Comments: No facial asymmetry or slurred speech.  Possible right-sided gaze preference.  Globally weak on motor exam more so on the left.  Patient having difficulty cooperating with neurologic exam.    ED Results / Procedures / Treatments   Labs (all labs ordered are listed, but only abnormal results are displayed) Labs Reviewed  CBC - Abnormal; Notable for the following components:  Result Value   WBC 12.8 (*)    RBC 5.30 (*)    HCT 48.7 (*)    All other components within normal limits  DIFFERENTIAL - Abnormal; Notable for the following components:   Neutro Abs 10.5 (*)    Abs Immature Granulocytes 0.09 (*)    All other components within normal limits  COMPREHENSIVE METABOLIC PANEL - Abnormal; Notable for the following components:   CO2 17 (*)    Glucose, Bld 982 (*)    BUN 63 (*)    Creatinine, Ser 2.30 (*)    AST 45 (*)    GFR, Estimated 21 (*)    Anion gap 17 (*)    All other components within normal limits  BETA-HYDROXYBUTYRIC ACID - Abnormal; Notable for the following components:   Beta-Hydroxybutyric Acid 0.70 (*)    All other components within normal limits  I-STAT CHEM 8, ED - Abnormal; Notable for the following components:   Sodium 148 (*)    Chloride 118 (*)    BUN 61 (*)    Creatinine, Ser 2.10 (*)    Glucose, Bld >700 (*)    Hemoglobin 15.6 (*)    All other components within normal limits  CBG MONITORING, ED - Abnormal; Notable for the following components:   Glucose-Capillary >600 (*)    All other components within normal limits  CBG MONITORING, ED - Abnormal; Notable for the following components:   Glucose-Capillary >600 (*)    All other components within normal limits  CBG MONITORING, ED - Abnormal; Notable for the  following components:   Glucose-Capillary >600 (*)    All other components within normal limits  CBG MONITORING, ED - Abnormal; Notable for the following components:   Glucose-Capillary >600 (*)    All other components within normal limits  CBG MONITORING, ED - Abnormal; Notable for the following components:   Glucose-Capillary >600 (*)    All other components within normal limits  TROPONIN I (HIGH SENSITIVITY) - Abnormal; Notable for the following components:   Troponin I (High Sensitivity) 22 (*)    All other components within normal limits  RESP PANEL BY RT-PCR (FLU A&B, COVID) ARPGX2  URINE CULTURE  PROTIME-INR  APTT  URINALYSIS, ROUTINE W REFLEX MICROSCOPIC  AMMONIA  BASIC METABOLIC PANEL  BASIC METABOLIC PANEL  BASIC METABOLIC PANEL  BASIC METABOLIC PANEL  BETA-HYDROXYBUTYRIC ACID  BETA-HYDROXYBUTYRIC ACID  HEMOGLOBIN A1C  I-STAT VENOUS BLOOD GAS, ED  TROPONIN I (HIGH SENSITIVITY)    EKG EKG Interpretation  Date/Time:  Sunday August 11 2021 12:59:35 EST Ventricular Rate:  63 PR Interval:  104 QRS Duration: 114 QT Interval:  461 QTC Calculation: 472 R Axis:   61 Text Interpretation: Sinus rhythm Short PR interval Borderline intraventricular conduction delay Low voltage, extremity and precordial leads Borderline repolarization abnormality nonspecific t wave flattening compared with prior 9/20 Confirmed by Aletta Edouard [id number]) on 08/11/2021 1:00:51 PM  Radiology DG Chest Port 1 View  Result Date: 08/11/2021 CLINICAL DATA:  Patient became weak and unresponsive during a bath today. EXAM: PORTABLE CHEST 1 VIEW COMPARISON:  07/18/2014. FINDINGS: There cardiac silhouette is normal in size. No mediastinal or hilar masses. Clear lungs.  No convincing pleural effusion.  No pneumothorax. Skeletal structures are demineralized, but grossly intact. IMPRESSION: No active disease. Electronically Signed   By: Lajean Manes M.D.   On: 08/11/2021 13:32   CT HEAD CODE STROKE WO  CONTRAST  Result Date: 08/11/2021 CLINICAL DATA:  Code stroke. Neuro deficit, acute, stroke suspected.  EXAM: CT HEAD WITHOUT CONTRAST TECHNIQUE: Contiguous axial images were obtained from the base of the skull through the vertex without intravenous contrast. RADIATION DOSE REDUCTION: This exam was performed according to the departmental dose-optimization program which includes automated exposure control, adjustment of the mA and/or kV according to patient size and/or use of iterative reconstruction technique. COMPARISON:  Head MRI 08/08/2019 FINDINGS: Brain: There is a new small region of low-density/edema in the left hippocampus. A chronic infarct is again noted posteriorly in the right basal ganglia. A lacunar infarct in the left corona radiata is new but also chronic in appearance. No intracranial hemorrhage, midline shift, or extra-axial fluid collection is identified. Mild cerebral atrophy is within normal limits for age. Hypodensities in the cerebral white matter bilaterally are nonspecific but compatible with mild chronic small vessel ischemic disease. Vascular: Calcified atherosclerosis at the skull base. No hyperdense vessel. Skull: No acute fracture or suspicious osseous lesion. Sinuses/Orbits: Visualized paranasal sinuses and mastoid air cells are clear. Unremarkable orbits. Other: None. IMPRESSION: 1. New small region of edema/possible infarct in the left hippocampus. 2. Chronic small vessel ischemia with old white matter infarcts. These results were communicated to Dr. Leonel Ramsay at 12:51 pm on 08/11/2021 by text page via the [facility] messaging system. Electronically Signed   By: Logan Bores M.D.   On: 08/11/2021 12:52    Procedures .Critical Care Performed by: Hayden Rasmussen, MD Authorized by: Hayden Rasmussen, MD   Critical care provider statement:    Critical care time (minutes):  45   Critical care time was exclusive of:  Separately billable procedures and treating other patients    Critical care was necessary to treat or prevent imminent or life-threatening deterioration of the following conditions:  CNS failure or compromise and metabolic crisis   Critical care was time spent personally by me on the following activities:  Development of treatment plan with patient or surrogate, discussions with consultants, evaluation of patient's response to treatment, examination of patient, obtaining history from patient or surrogate, ordering and performing treatments and interventions, ordering and review of laboratory studies, ordering and review of radiographic studies, pulse oximetry, re-evaluation of patient's condition and review of old charts   I assumed direction of critical care for this patient from another provider in my specialty: no      Medications Ordered in ED Medications  insulin regular, human (MYXREDLIN) 100 units/ 100 mL infusion (9.5 Units/hr Intravenous Rate/Dose Verify 08/11/21 1703)  lactated ringers infusion ( Intravenous New Bag/Given 08/11/21 1544)  dextrose 5 % in lactated ringers infusion (has no administration in time range)  dextrose 50 % solution 0-50 mL (has no administration in time range)  potassium chloride 10 mEq in 100 mL IVPB (10 mEq Intravenous New Bag/Given 08/11/21 1705)  enoxaparin (LOVENOX) injection 30 mg (has no administration in time range)   stroke: mapping our early stages of recovery book ( Does not apply Not Given 08/11/21 1546)  aspirin suppository 300 mg (300 mg Rectal Given 08/11/21 1528)  ondansetron (ZOFRAN) injection 4 mg (has no administration in time range)  haloperidol lactate (HALDOL) injection 5 mg (has no administration in time range)  hydrALAZINE (APRESOLINE) injection 5 mg (has no administration in time range)  sodium chloride 0.9 % bolus 1,000 mL (0 mLs Intravenous Stopped 08/11/21 1428)  insulin aspart (novoLOG) injection 10 Units (10 Units Intravenous Given 08/11/21 1307)  lactated ringers bolus 1,000 mL (1,000 mLs  Intravenous Other (enter comment in med admin window) 08/11/21 1704)  ED Course/ Medical Decision Making/ A&P Clinical Course as of 08/11/21 1710  Sun Aug 11, 2021  1253 Daughter is here now to clarify some of the history.  She was awake and alert had breakfast this morning.  After getting showered with the CNA and got her clothes on she had this unresponsible episode while she was sitting.  I think it lasted about 30 minutes.  Improved when EMS got there and later down.  No trauma.  Has been a little bit more confused over the last few days and she wondered if there might be a UTI.  Had some urinary symptoms and recently got put on oxybutynin.  Has been aspirating on and off since her prior stroke a few years ago.  They are using thickened liquids. [MB]  1309 Chest x-ray interpreted by me as possible left effusion no clear infiltrates.  Awaiting radiology reading. [MB]  2947 Discussed with Dr. Lorin Mercy [facility] hospitalist who will evaluate the patient for admission. [MB]    Clinical Course User Index [MB] Hayden Rasmussen, MD                           Medical Decision Making Amount and/or Complexity of Data Reviewed Labs: ordered. Radiology: ordered.  Risk Prescription drug management. Decision regarding hospitalization.  Kacelyn Rowzee Fricker was evaluated in Emergency Department on 08/11/2021 for the symptoms described in the history of present illness. She was evaluated in the context of the global COVID-19 pandemic, which necessitated consideration that the patient might be at risk for infection with the SARS-CoV-2 virus that causes COVID-19. Institutional protocols and algorithms that pertain to the evaluation of patients at risk for COVID-19 are in a state of rapid change based on information released by regulatory bodies including the CDC and federal and state organizations. These policies and algorithms were followed during the patient's care in the ED.  This patient complains of altered  mental status unresponsive episode possible new weakness; this involves an extensive number of treatment Options and is a complaint that carries with it a high risk of complications and Morbidity. The differential includes stroke, bleed, metabolic derangement, hypoglycemia, hyperglycemia, infection  I ordered, reviewed and interpreted labs, which included CBC with elevated white count stable hemoglobin, chemistries with low bicarb elevated glucose elevated creatinine reflecting some dehydration and possible DKA, beta hydroxybutyrate elevated, troponins mildly elevated but flat, COVID and flu negative I ordered medication IV fluids IV insulin I ordered imaging studies which included chest x-ray and head CT and I independently    visualized and interpreted imaging which showed possible new stroke Additional history obtained from EMS and patient's daughter Previous records obtained and reviewed in epic, it sounds like patient has a steady decline since admission for stroke back in 2020 I consulted neurology Dr. Leonel Ramsay and [facility] hospitalist Dr. Lorin Mercy and discussed lab and imaging findings  Critical Interventions: Bedside evaluation of patient's acute neurologic symptoms within the stroke window for possible TNK candidate.  Ultimately determined not to be a thrombolytic candidate.  Has significant metabolic derangement.  Will need admission possible IV drip of insulin  After the interventions stated above, I reevaluated the patient and found still symptomatically altered and not back at baseline.  Daughter agreeable to admission for further management of her symptoms.          Final Clinical Impression(s) / ED Diagnoses Final diagnoses:  Altered mental status, unspecified altered mental status type  Hyperglycemia  AKI (  acute kidney injury) [facility])    Rx / DC Orders ED Discharge Orders     None         Hayden Rasmussen, MD 08/11/21 [phone]

## 2021-08-11 NOTE — Assessment & Plan Note (Signed)
-  Patient has a DNR form at the bedside and has discussed with her niece her desire for no aggressive interventions -She is enrolled in hospice -She previously failed CIR and family prefers in-home 24/7 care rather than SNF -As such, will treat hyperglycemic crisis for now, assess for UTI, check EEG, and hold on MRI -Therapy evaluations requested, but she is likely to return home with current level of care unless the decision is made to transition to comfort care only

## 2021-08-11 NOTE — Progress Notes (Signed)
EEG complete - results pending 

## 2021-08-11 NOTE — Assessment & Plan Note (Signed)
-  Will go permissive HTN for now -Will order IV hydralazine for SBP >220/DBP >120 -Can resume home medications when appropriate

## 2021-08-11 NOTE — H&P (Signed)
History and Physical    Patient: Julia Banks UQJ:[id number] DOB: 1940/08/28 DOA: 08/11/2021 DOS: the patient was seen and examined on 08/11/2021 PCP: Ria Bush, MD  Patient coming from: Home - lives with niece, her husband, and her 2 teenaged children; NOK: Niece, [phone]   Chief Complaint: AMS  HPI: Julia Banks is a 81 y.o. female with medical history significant of remote breast cancer; CVA with residual L-sided deficits; vascular dementia, on hospice; stage 3 CKD; HTN; HLD; hypothyroidism; OSA on CPAP; and DM presenting with AMS. This AM, she got up about 8 and ate without difficulty.  She was watching TV.  The hospice aide came about 845 and had her showered and dressed and she lost consciousness.  She was slumped to the left, would not respond, O2 in 80s, BP was low (?100/50), glucose was 520 (which was unusual for her).  Hypoxia resolved with NRB and she is currently on RA.  She is currently very different from her usual - not speaking loudly, not responding as usual.  She did report that she didn't sleep well last night.  She does periodically vagal but this was definitely different.  At baseline since the stroke in 03/2019, she cannot walk, feeds herself with her R arm, sit-to-stand and hoyer lift for transfers at home.  Verbalizes at times and periodically unable to verbalize.  Needs assistance with meals.  Had dysphagia in the past and used thickeners, but improved so none recently.  Her niece has noticed recently that she sometimes lays her lift chair back and tries to drink and then chokes. Some concern for recent aspiration.  Appetite has decreased in the last month or two.  They have had to decrease her insulin as a result.  She is enrolled in hospice of [facility] due to decline, falls.  There was concern on Thursday about UTI - acted a little different and there was an odor in her diaper.    ER Course:  Usually pretty functional.  Chronic left-sided deficits.   CNA was present and she went unresponsive for about 30 minutes.  EMS reported mumbling in response to pain. Code stroke called by EMS.  Minimal participation with exam.  May be related to DKA/syncope/hypovolemia but also concerning for CVA with abnormal CT.  CT abdomen ordered.     Review of Systems: unable to review all systems due to the inability of the patient to answer questions. Past Medical History:  Diagnosis Date   Arthritis    Asthma    Breast cancer, left breast ([place]) 02/2014   DCIS, ER+ PR- initially rec bilat mastectomy with sentinal LN biopsy L axilla but now planning just L mastectomy [name])   Chronic kidney disease, stage 3 (HCC)    Colon polyps    benign   Complication of anesthesia    hard to wake up   Coronary artery disease    30% stenosis reported on previous cardiac catheterization in 2006. This was done in [facility]   COVID-19 virus infection 07/2019   GERD (gastroesophageal reflux disease)    Headaches, cluster    Hyperlipidemia    Hypertension    Hypothyroidism    Osteopenia 07/2014   DEXA 0.5 spine, 01.7 R femoral neck   Pneumonia    years ago   Secondary hyperparathyroidism of renal origin [facility])    likely   Sensorineural hearing loss (SNHL) of both ears 01/27/2016   Audiology eval - moderately severe sensorineural hearing loss bilaterally rec amplification (  07/2016 - [name] audiologist)   Sleep apnea    on CPAP   Type 2 diabetes, uncontrolled, with neuropathy    followed by endo Dr Buddy Duty   Past Surgical History:  Procedure Laterality Date   ABDOMINAL HYSTERECTOMY     heavy bleeding, ovaries remain   APPENDECTOMY     BREAST BIOPSY Right    benign   BREAST BIOPSY Left 02/2014   ?DCIS   CARDIAC CATHETERIZATION  2006   [place]  10/2013   mild 2v nonobstructive disease, EF 60%   CARDIOVASCULAR STRESS TEST  09/2012   WNL ([place])   CHOLECYSTECTOMY     COLONOSCOPY  05/2010    2 TAs, diverticulosis, rpt 2 yrs (Dr Elissa Hefty in [place])   COLONOSCOPY  12/2014   1 TA, rpt 5 yrs [facility])   cortisone right knee  01/04/15   GALLBLADDER SURGERY     MASTECTOMY W/ SENTINEL NODE BIOPSY Left 06/07/2014   DR WAKEFIELD   SIMPLE MASTECTOMY WITH AXILLARY SENTINEL NODE BIOPSY Left 06/07/2014   Procedure: LEFT TOTAL MASTECTOMY WITH LEFT AXILLARY SENTINEL NODE BIOPSY;  Surgeon: Rolm Bookbinder, MD;  Location: [facility];  Service: General;  Laterality: Left;   Social History:  reports that she has never smoked. She has never used smokeless tobacco. She reports that she does not drink alcohol and does not use drugs.  Allergies  Allergen Reactions   Namenda [Memantine] Other (See Comments)    migraine   Repatha [Evolocumab] Other (See Comments)    migraine   Statins Other (See Comments)    myalgias   Zetia [Ezetimibe] Other (See Comments)    Joint pain, memory changes   Altace [Ramipril] Other (See Comments)    Upset stomach   Aspirin Nausea And Vomiting    Ok with 81mg     Metformin And Related Other (See Comments)    GI upset    Family History  Problem Relation Age of Onset   Arthritis Mother    Hyperlipidemia Mother    Stroke Mother    Hypertension Mother    Diabetes Mother    Other Mother        parathyroid adenoma   Arthritis Father    Hyperlipidemia Father    CAD Father        MI   Hypertension Father    Breast cancer Other    Ovarian cancer Other    Leukemia Brother        dx in his 54s   Colon cancer Maternal Uncle        dx in his 34s   Ovarian cancer Maternal Grandmother 90   Melanoma Other 19       dx 2x - 60 and 29/dx 3x - ages 38, 73, and 59   Cancer Neg Hx    Crohn's disease Neg Hx    Colon polyps Neg Hx    Ulcerative colitis Neg Hx    Stomach cancer Neg Hx    Rectal cancer Neg Hx     Prior to Admission medications   Medication Sig Start Date End Date Taking? Authorizing Provider  acetaminophen (TYLENOL) 325 MG tablet Take 2 tablets (650  mg total) by mouth 3 (three) times daily as needed for mild pain (or temp > 37.5 C (99.5 F)). 06/05/20  Yes Ria Bush, MD  ADVAIR DISKUS 250-50 MCG/DOSE AEPB TAKE 1 PUFF BY MOUTH TWICE A DAY Patient taking differently: Inhale 1 puff into  the lungs in the morning and at bedtime. 06/15/20  Yes Ria Bush, MD  albuterol (VENTOLIN HFA) 108 (90 Base) MCG/ACT inhaler TAKE 2 PUFFS BY MOUTH EVERY 6 HOURS AS NEEDED FOR WHEEZE OR SHORTNESS OF BREATH Patient taking differently: Inhale 2 puffs into the lungs every 6 (six) hours as needed for wheezing or shortness of breath. 04/09/20  Yes Ria Bush, MD  amLODipine (NORVASC) 5 MG tablet Take 5 mg by mouth daily. 09/25/11  Yes [provider]  aspirin EC 81 MG EC tablet Take 1 tablet (81 mg total) by mouth daily. 03/10/19  Yes Hosie Poisson, MD  baclofen (LIORESAL) 10 MG tablet Take 1 tablet (10 mg total) by mouth at bedtime. Patient taking differently: Take 10 mg by mouth at bedtime as needed for muscle spasms. 10/31/20  Yes McCue, Janett Billow, NP  carvedilol (COREG) 6.25 MG tablet TAKE 1 TABLET BY MOUTH 2 (TWO) TIMES DAILY. PLEASE SCHEDULE OFFICE VISIT FOR FURTHER REFILLS. THANK YOU! Patient taking differently: Take 6.25 mg by mouth in the morning and at bedtime. 06/13/21  Yes Wellington Hampshire, MD  famotidine (PEPCID) 20 MG tablet TAKE 1 TABLET BY MOUTH EVERYDAY AT BEDTIME Patient taking differently: Take 20 mg by mouth at bedtime. 06/27/20  Yes Ria Bush, MD  insulin glargine (LANTUS) 100 UNIT/ML injection Inject 0.42 mLs (42 Units total) into the skin daily. Patient taking differently: Inject 15 Units into the skin daily. 08/14/20  Yes Ria Bush, MD  Insulin Pen Needle (BD PEN NEEDLE NANO U/F) 32G X 4 MM MISC USE 1 AS DIRECTED TO INJECT MEDICATION DAILY. 05/30/19  Yes Ria Bush, MD  Insulin Syringe-Needle U-100 31G X 15/64" 1 ML MISC Use as instructed to administer insulin daily. 08/14/20  Yes Ria Bush, MD   levothyroxine (SYNTHROID) 50 MCG tablet Take 1 tablet (50 mcg total) by mouth daily before breakfast. 10/10/20  Yes Ria Bush, MD  losartan (COZAAR) 100 MG tablet Take 1 tablet (100 mg total) by mouth daily. Patient taking differently: Take 50 mg by mouth daily. 10/13/20  Yes Ria Bush, MD  melatonin 3 MG TABS tablet Take 1 tablet (3 mg total) by mouth at bedtime. Patient taking differently: Take 3 mg by mouth at bedtime as needed (sleep). 10/31/20  Yes McCue, Janett Billow, NP  oxybutynin (DITROPAN-XL) 5 MG 24 hr tablet Take 5 mg by mouth at bedtime.   Yes [provider]  QUEtiapine (SEROQUEL) 50 MG tablet Take 1 tablet (50 mg total) by mouth in the morning AND 1.5 tablets (75 mg total) at bedtime. Patient taking differently: 50mg  at bedtime 06/04/20  Yes McCue, Janett Billow, NP  sertraline (ZOLOFT) 100 MG tablet Take 1 tablet (100 mg total) by mouth at bedtime. 10/31/20  Yes McCue, Janett Billow, NP  Cholecalciferol (VITAMIN D3) 1.25 MG (50000 UT) TABS Take 1 tablet by mouth once a week. Patient not taking: Reported on 08/11/2021 10/10/20   Ria Bush, MD  hydrochlorothiazide (MICROZIDE) 12.5 MG capsule Take 1 capsule (12.5 mg total) by mouth daily. Patient not taking: Reported on 08/11/2021 10/13/20   Ria Bush, MD  memantine (NAMENDA) 10 MG tablet Take 1 tablet (10 mg total) by mouth 2 (two) times daily. Start after completion of titration pack Patient not taking: Reported on 08/11/2021 11/30/20   Frann Rider, NP    Physical Exam: Vitals:   08/11/21 1345 08/11/21 1400 08/11/21 1415 08/11/21 1430  BP: (!) 150/63 (!) 172/65 (!) 157/68 (!) 157/60  Pulse: (!) 59 (!) 59 (!) 59 (!) 57  Resp: 18 19 16 17   Temp:      TempSrc:      SpO2: 96% 96% 99% 98%  Weight:      Height:       General:  Appears somnolent, quiet and scant but appropriate speech Eyes:  PERRL, EOMI, normal lids, iris ENT:  grossly normal hearing, lips & tongue, dry mm Neck:  no LAD, masses or  thyromegaly Cardiovascular:  RRR, no m/r/g. No LE edema.  Respiratory:   CTA bilaterally with no wheezes/rales/rhonchi.  Normal respiratory effort. Abdomen:  soft, NT, ND Skin:  no rash or induration seen on limited exam Musculoskeletal:  chronic L hemiparesis with contractures of L hand Psychiatric:  flat mood and affect, speech sparse but appropriate, follows some commands Neurologic:  unable to effectively perform   Radiological Exams on Admission: Independently reviewed - see discussion in A/P where applicable  DG Chest Port 1 View  Result Date: 08/11/2021 CLINICAL DATA:  Patient became weak and unresponsive during a bath today. EXAM: PORTABLE CHEST 1 VIEW COMPARISON:  07/18/2014. FINDINGS: There cardiac silhouette is normal in size. No mediastinal or hilar masses. Clear lungs.  No convincing pleural effusion.  No pneumothorax. Skeletal structures are demineralized, but grossly intact. IMPRESSION: No active disease. Electronically Signed   By: Lajean Manes M.D.   On: 08/11/2021 13:32   CT HEAD CODE STROKE WO CONTRAST  Result Date: 08/11/2021 CLINICAL DATA:  Code stroke. Neuro deficit, acute, stroke suspected. EXAM: CT HEAD WITHOUT CONTRAST TECHNIQUE: Contiguous axial images were obtained from the base of the skull through the vertex without intravenous contrast. RADIATION DOSE REDUCTION: This exam was performed according to the departmental dose-optimization program which includes automated exposure control, adjustment of the mA and/or kV according to patient size and/or use of iterative reconstruction technique. COMPARISON:  Head MRI 08/08/2019 FINDINGS: Brain: There is a new small region of low-density/edema in the left hippocampus. A chronic infarct is again noted posteriorly in the right basal ganglia. A lacunar infarct in the left corona radiata is new but also chronic in appearance. No intracranial hemorrhage, midline shift, or extra-axial fluid collection is identified. Mild cerebral  atrophy is within normal limits for age. Hypodensities in the cerebral white matter bilaterally are nonspecific but compatible with mild chronic small vessel ischemic disease. Vascular: Calcified atherosclerosis at the skull base. No hyperdense vessel. Skull: No acute fracture or suspicious osseous lesion. Sinuses/Orbits: Visualized paranasal sinuses and mastoid air cells are clear. Unremarkable orbits. Other: None. IMPRESSION: 1. New small region of edema/possible infarct in the left hippocampus. 2. Chronic small vessel ischemia with old white matter infarcts. These results were communicated to Dr. Leonel Ramsay at 12:51 pm on 08/11/2021 by text page via the [facility] messaging system. Electronically Signed   By: Logan Bores M.D.   On: 08/11/2021 12:52    EKG: Independently reviewed.  NSR with rate 63; nonspecific ST changes with no evidence of acute ischemia   Labs on Admission: I have personally reviewed the available labs and imaging studies at the time of the admission.  Pertinent labs:    CO2 17 Anion gap 17 Glucose 982 BUN 63/Creatinine 2.3/GFR 21; 17/1.2/44 in 4/22 HS troponin 22 WBC 12.8 Beta-hydroxybutyrate 0.7   Assessment and Plan: * Acute metabolic encephalopathy- (present on admission) -Patient presenting with acute onset of AMS -Niece reports that this may have been a vasovagal episode, as she has had these in the past; however, she has not returned to baseline -There was concern for UTI earlier  in the week; UA and culture are pending -She also has mild DKA -Code stroke was called on presentation, but she is unable to participate in neurologic evaluation effectively; head CT showed new small region of edema/possible infarct in the L hippocampus -This may be c/w stroke or could also suggest seizure; see below, but based on various other considerations at this time in conjunction with late dementia status on hospice, after discussion with niece and Dr. Leonel Ramsay an MRI would be  unlikely to affect her outcomes at this time and so will hold -Will request EEG -Will admit to progressive care for close ongoing monitoring, but if she does not improve then transition to comfort care will be considered  Hyperglycemic crisis due to diabetes mellitus ([place])- (present on admission) -Patient with decreasing PO intake and so insulin dose has been lowered -She was altered with glucose >500 at home; AMS may be associated with this issue -No indication of illness as source, but UTI is a consideration -Mild DKA on admission based on HCO3 17, anion gap 17, patient drowsy/altered -Will admit to SDU with DKA protocol -Would recommend continuing insulin drip at least until morning regardless of rapidity of closure of gap and normalization of labs (and also because I am concerned about aspiration risk and would not feel comfortable giving her PO intake until speech therapy has evaluated) -K+ slightly decreased at time of presentation and so potassium supplementation added -Will give an additional 2L IVF at this time -IVF at 150 cc/hr, LR until glucose <250 and then decrease rate to 125 and change to [facility]  Vascular dementia [facility])- (present on admission) -She has been declining since her stroke in 2020 -Her niece reports recent decrease in function, appetite - concern for more accentuated decline -She enrolled in hospice in 12/2020 -She is taking Zoloft and Seroquel but does not currently appear capable of effectively swallowing so will hold -Will add prn IV Haldol -Delirium precautions  Hemiparesis affecting left side as late effect of cerebrovascular accident (CVA) ([place]) -She previously failed CIR and was recommended for SNF vs home with 24/7 caregivers; family opted for the latter -Based on this situation, she continues to have marked L hemiparesis with contractures -She is unlikely to qualify for CIR at this point and family prefers to care for her at home vs. SNF -As such, MRI  deferred at this time despite possible recurrent CVA -EEG was ordered to evaluate for seizure -Stroke order set was used to include therapy evaluations -For now, rectal ASA given concern for dysphagia  Advanced care planning/counseling discussion -Patient has a DNR form at the bedside and has discussed with her niece her desire for no aggressive interventions -She is enrolled in hospice -She previously failed CIR and family prefers in-home 24/7 care rather than SNF -As such, will treat hyperglycemic crisis for now, assess for UTI, check EEG, and hold on MRI -Therapy evaluations requested, but she is likely to return home with current level of care unless the decision is made to transition to comfort care only  Hypothyroidism- (present on admission) -Resume Synthroid when safely able  Hyperlipidemia associated with type 2 diabetes mellitus ([place])- (present on admission) -Most recent LDL was 185 (eek!) -Unfortunately, she has statin and Zetia intolerance -Could consider statin clinic f/u depending on outcome of current hospitalization  Essential hypertension, benign- (present on admission) -Will go permissive HTN for now -Will order IV hydralazine for SBP >220/DBP >120 -Can resume home medications when appropriate  OSA on CPAP -Continue CPAP  Advance Care Planning:   Code Status: DNR   Consults: Neurology; PT/OT/ST; TOC team  DVT Prophylaxis: Lovenox  Family Communication: Niece was present throughout evaluation, she is an NP with [employer]   Severity of Illness: The appropriate patient status for this patient is INPATIENT. Inpatient status is judged to be reasonable and necessary in order to provide the required intensity of service to ensure the patient's safety. The patient's presenting symptoms, physical exam findings, and initial radiographic and laboratory data in the context of their chronic comorbidities is felt to place them at high risk for further clinical  deterioration. Furthermore, it is not anticipated that the patient will be medically stable for discharge from the hospital within 2 midnights of admission.   * I certify that at the point of admission it is my clinical judgment that the patient will require inpatient hospital care spanning beyond 2 midnights from the point of admission due to high intensity of service, high risk for further deterioration and high frequency of surveillance required.*  Author: Karmen Bongo, MD 08/11/2021 3:40 PM  For on call review [email or web address].

## 2021-08-12 ENCOUNTER — Inpatient Hospital Stay (HOSPITAL_COMMUNITY)

## 2021-08-12 DIAGNOSIS — L8990 Pressure ulcer of unspecified site, unspecified stage: Secondary | ICD-10-CM | POA: Insufficient documentation

## 2021-08-12 DIAGNOSIS — G9341 Metabolic encephalopathy: Secondary | ICD-10-CM

## 2021-08-12 LAB — GLUCOSE, CAPILLARY
Glucose-Capillary: 124 mg/dL — ABNORMAL HIGH (ref 70–99)
Glucose-Capillary: 145 mg/dL — ABNORMAL HIGH (ref 70–99)
Glucose-Capillary: 146 mg/dL — ABNORMAL HIGH (ref 70–99)
Glucose-Capillary: 148 mg/dL — ABNORMAL HIGH (ref 70–99)
Glucose-Capillary: 149 mg/dL — ABNORMAL HIGH (ref 70–99)
Glucose-Capillary: 162 mg/dL — ABNORMAL HIGH (ref 70–99)
Glucose-Capillary: 167 mg/dL — ABNORMAL HIGH (ref 70–99)
Glucose-Capillary: 171 mg/dL — ABNORMAL HIGH (ref 70–99)
Glucose-Capillary: 180 mg/dL — ABNORMAL HIGH (ref 70–99)
Glucose-Capillary: 184 mg/dL — ABNORMAL HIGH (ref 70–99)
Glucose-Capillary: 185 mg/dL — ABNORMAL HIGH (ref 70–99)

## 2021-08-12 LAB — BASIC METABOLIC PANEL
Anion gap: 11 (ref 5–15)
Anion gap: 12 (ref 5–15)
BUN: 51 mg/dL — ABNORMAL HIGH (ref 8–23)
BUN: 53 mg/dL — ABNORMAL HIGH (ref 8–23)
CO2: 18 mmol/L — ABNORMAL LOW (ref 22–32)
CO2: 20 mmol/L — ABNORMAL LOW (ref 22–32)
Calcium: 9.7 mg/dL (ref 8.9–10.3)
Calcium: 9.8 mg/dL (ref 8.9–10.3)
Chloride: 121 mmol/L — ABNORMAL HIGH (ref 98–111)
Chloride: 126 mmol/L — ABNORMAL HIGH (ref 98–111)
Creatinine, Ser: 1.75 mg/dL — ABNORMAL HIGH (ref 0.44–1.00)
Creatinine, Ser: 1.85 mg/dL — ABNORMAL HIGH (ref 0.44–1.00)
GFR, Estimated: 28 mL/min — ABNORMAL LOW (ref >60)
GFR, Estimated: 29 mL/min — ABNORMAL LOW (ref >60)
Glucose, Bld: 180 mg/dL — ABNORMAL HIGH (ref 70–99)
Glucose, Bld: 242 mg/dL — ABNORMAL HIGH (ref 70–99)
Potassium: 3.3 mmol/L — ABNORMAL LOW (ref 3.5–5.1)
Potassium: 3.8 mmol/L (ref 3.5–5.1)
Sodium: 153 mmol/L — ABNORMAL HIGH (ref 135–145)
Sodium: 155 mmol/L — ABNORMAL HIGH (ref 135–145)

## 2021-08-12 LAB — AMMONIA: Ammonia: 44 umol/L — ABNORMAL HIGH (ref 9–35)

## 2021-08-12 LAB — BETA-HYDROXYBUTYRIC ACID: Beta-Hydroxybutyric Acid: 0.09 mmol/L (ref 0.05–0.27)

## 2021-08-12 LAB — MAGNESIUM: Magnesium: 2.2 mg/dL (ref 1.7–2.4)

## 2021-08-12 LAB — TSH: TSH: 1.146 u[IU]/mL (ref 0.350–4.500)

## 2021-08-12 MED ORDER — DOCUSATE SODIUM 100 MG PO CAPS
200.0000 mg | ORAL_CAPSULE | Freq: Two times a day (BID) | ORAL | Status: DC
  Administered 2021-08-12 – 2021-08-13 (×3): 200 mg via ORAL
  Filled 2021-08-12 (×4): qty 2

## 2021-08-12 MED ORDER — INSULIN GLARGINE-YFGN 100 UNIT/ML ~~LOC~~ SOLN
25.0000 [IU] | Freq: Two times a day (BID) | SUBCUTANEOUS | Status: DC
  Administered 2021-08-12: 25 [IU] via SUBCUTANEOUS
  Filled 2021-08-12 (×2): qty 0.25

## 2021-08-12 MED ORDER — INSULIN GLARGINE-YFGN 100 UNIT/ML ~~LOC~~ SOLN
25.0000 [IU] | Freq: Every day | SUBCUTANEOUS | Status: DC
  Administered 2021-08-13: 25 [IU] via SUBCUTANEOUS
  Filled 2021-08-12 (×2): qty 0.25

## 2021-08-12 MED ORDER — INSULIN ASPART 100 UNIT/ML IJ SOLN: 0.0000 [IU] | Freq: Three times a day (TID) | INTRAMUSCULAR | Status: DC

## 2021-08-12 MED ORDER — POTASSIUM CL IN DEXTROSE 5% 20 MEQ/L IV SOLN
20.0000 meq | INTRAVENOUS | Status: DC
  Administered 2021-08-12: 20 meq via INTRAVENOUS
  Filled 2021-08-12 (×2): qty 1000

## 2021-08-12 MED ORDER — DEXTROSE 5 % IV SOLN: INTRAVENOUS | Status: DC

## 2021-08-12 MED ORDER — INSULIN ASPART 100 UNIT/ML IJ SOLN: 0.0000 [IU] | Freq: Every day | INTRAMUSCULAR | Status: DC

## 2021-08-12 MED ORDER — SORBITOL 70 % SOLN
400.0000 mL | TOPICAL_OIL | Freq: Once | ORAL | Status: AC
  Administered 2021-08-12: 400 mL via RECTAL
  Filled 2021-08-12: qty 120

## 2021-08-12 MED ORDER — INSULIN ASPART 100 UNIT/ML IJ SOLN
0.0000 [IU] | INTRAMUSCULAR | Status: DC
  Administered 2021-08-12 (×3): 2 [IU] via SUBCUTANEOUS
  Administered 2021-08-12: 3 [IU] via SUBCUTANEOUS
  Administered 2021-08-13: 2 [IU] via SUBCUTANEOUS
  Administered 2021-08-13: 3 [IU] via SUBCUTANEOUS
  Administered 2021-08-13: 5 [IU] via SUBCUTANEOUS
  Administered 2021-08-13 (×2): 2 [IU] via SUBCUTANEOUS
  Administered 2021-08-13: 3 [IU] via SUBCUTANEOUS
  Administered 2021-08-13: 5 [IU] via SUBCUTANEOUS
  Administered 2021-08-14: 2 [IU] via SUBCUTANEOUS

## 2021-08-12 MED ORDER — PANTOPRAZOLE SODIUM 40 MG IV SOLR
40.0000 mg | Freq: Every day | INTRAVENOUS | Status: DC
  Administered 2021-08-12 – 2021-08-14 (×3): 40 mg via INTRAVENOUS
  Filled 2021-08-12 (×3): qty 10

## 2021-08-12 MED ORDER — SODIUM CHLORIDE 0.9 % IV SOLN
3.0000 g | Freq: Two times a day (BID) | INTRAVENOUS | Status: DC
  Administered 2021-08-12 – 2021-08-13 (×3): 3 g via INTRAVENOUS
  Filled 2021-08-12 (×3): qty 8

## 2021-08-12 NOTE — Evaluation (Signed)
Speech Language Pathology Evaluation Patient Details Name: Julia Banks MRN: [id number] DOB: [birth year] Today's Date: 08/12/2021 Time: 8127-5170 SLP Time Calculation (min) (ACUTE ONLY): 10 min  Problem List:  Patient Active Problem List   Diagnosis Date Noted   Pressure injury of skin 08/12/2021   Hyperglycemic crisis due to diabetes mellitus ([place]) 01/74/9449   Acute metabolic encephalopathy 67/59/1638   Vascular dementia ([place]) 01/18/2021   Statin intolerance 10/13/2020   Vitamin D deficiency 10/13/2020   Urine incontinence 10/10/2020   MDD (major depressive disorder), recurrent episode, mild ([place]) 05/30/2019   History of cerebrovascular accident (CVA) due to ischemia 03/09/2019   Hemiparesis affecting left side as late effect of cerebrovascular accident (CVA) ([place]) 03/09/2019   Advanced care planning/counseling discussion 01/27/2016   Sensorineural hearing loss (SNHL) of both ears 01/27/2016   Insomnia 02/13/2015   Chronic cough 10/10/2014   Osteopenia 07/31/2014   S/P mastectomy 06/07/2014   Genetic testing 06/02/2014   Breast cancer of upper-outer quadrant of left female breast ([place]) 03/28/2014   Grieving 10/25/2013   Medicare annual wellness visit, subsequent 06/28/2013   Diabetes mellitus with stage 3 chronic kidney disease ([place])    Hypothyroidism 04/13/2013   GERD (gastroesophageal reflux disease)    Essential hypertension, benign 09/17/2012   Hyperlipidemia associated with type 2 diabetes mellitus ([place]) 09/17/2012   Controlled diabetes mellitus type 2 with complications ([place]) 46/65/9935   OSA on CPAP 02/13/2012   Coronary artery disease 02/13/2012   Past Medical History:  Past Medical History:  Diagnosis Date   Arthritis    Asthma    Breast cancer, left breast ([place]) 02/2014   DCIS, ER+ PR- initially rec bilat mastectomy with sentinal LN biopsy L axilla but now planning just L mastectomy [name])   Chronic kidney disease, stage 3 ([facility])    Colon polyps     benign   Complication of anesthesia    hard to wake up   Coronary artery disease    30% stenosis reported on previous cardiac catheterization in 2006. This was done in [facility]   COVID-19 virus infection 07/2019   GERD (gastroesophageal reflux disease)    Headaches, cluster    Hyperlipidemia    Hypertension    Hypothyroidism    Osteopenia 07/2014   DEXA 0.5 spine, 01.7 R femoral neck   Pneumonia    years ago   Secondary hyperparathyroidism of renal origin ([place])    likely   Sensorineural hearing loss (SNHL) of both ears 01/27/2016   Audiology eval - moderately severe sensorineural hearing loss bilaterally rec amplification (07/2016 - [name] audiologist)   Sleep apnea    on CPAP   Type 2 diabetes, uncontrolled, with neuropathy    followed by endo Dr Buddy Duty   Past Surgical History:  Past Surgical History:  Procedure Laterality Date   ABDOMINAL HYSTERECTOMY     heavy bleeding, ovaries remain   APPENDECTOMY     BREAST BIOPSY Right    benign   BREAST BIOPSY Left 02/2014   ?DCIS   CARDIAC CATHETERIZATION  2006   [place], [place]  10/2013   mild 2v nonobstructive disease, EF 60%   CARDIOVASCULAR STRESS TEST  09/2012   WNL ([place])   CHOLECYSTECTOMY     COLONOSCOPY  05/2010   2 TAs, diverticulosis, rpt 2 yrs (Dr Elissa Hefty in [place])   [place]  12/2014   1 TA, rpt 5 yrs Henrene Pastor)   cortisone right knee  01/04/15  GALLBLADDER SURGERY     MASTECTOMY W/ SENTINEL NODE BIOPSY Left 06/07/2014   DR WAKEFIELD   SIMPLE MASTECTOMY WITH AXILLARY SENTINEL NODE BIOPSY Left 06/07/2014   Procedure: LEFT TOTAL MASTECTOMY WITH LEFT AXILLARY SENTINEL NODE BIOPSY;  Surgeon: Rolm Bookbinder, MD;  Location: [facility];  Service: General;  Laterality: Left;   HPI:  Pt is a 81 yo female presenting with AMS after an episode of unresponsiveness. She is enrolled in hospice due to decline, falls. Work up underway; found to be in mild DKA. CTH showed  a new small region of edema/possible infarct in the L hippocampus. CXR with no active disease, although CT abdomen revealed L basilar airspace disease concerning for PNA and small amounts of material in thr airway concerning for aspiration. Per MD H&P note, pt had a h/o dysphagia after stroke in 2020 and needed to use thickened liquids, but she had improved and no longer used them PTA. However, family had noted coughing when pt tried to drink reclined at home. SLP eval from that time only for cognition (24/30 on MOCA but at her baseline). PMH also includes: CVA with residual L weakness, vascular dementia, GERD, PNA ("years ago"), SNHL, OSA, DMII, COVID, remote breast ca, CKD, HTN, HLD, hypothyroidism   Assessment / Plan / Recommendation Clinical Impression  Pt is drowsy and oriented to person only, but participatory in tasks with Min cues for sustained attention. She has trouble remembering that she is at the hospital even with reinforcement provided. She needs Min cues for completion of one-step commands. Her speech is soft and mildly slurred, but fairly intelligible with the shorter responses she provides. She believes that her speech is slower in rate than it normally is. Pt could benefit from trial of f/u SLP services if within [place].    SLP Assessment  SLP Recommendation/Assessment: Patient needs continued Speech Lanaguage Pathology Services SLP Visit Diagnosis: Dysarthria and anarthria (R47.1);Cognitive communication deficit (R41.841)    Recommendations for follow up therapy are one component of a multi-disciplinary discharge planning process, led by the attending physician.  Recommendations may be updated based on patient status, additional functional criteria and insurance authorization.    Follow Up Recommendations   (TBA pending GOC)    Assistance Recommended at Discharge  Frequent or constant Supervision/Assistance  Functional Status Assessment Patient has had a recent decline in their  functional status and/or demonstrates limited ability to make significant improvements in function in a reasonable and predictable amount of time  Frequency and Duration min 2x/week  2 weeks      SLP Evaluation Cognition  Overall Cognitive Status: No family/caregiver present to determine baseline cognitive functioning Arousal/Alertness: Lethargic Orientation Level: Oriented to person;Disoriented to place;Disoriented to time;Disoriented to situation Attention: Sustained Sustained Attention: Impaired Sustained Attention Impairment: Verbal basic Memory: Impaired Memory Impairment: Decreased recall of new information Awareness: Impaired Awareness Impairment: Intellectual impairment       Comprehension  Auditory Comprehension Overall Auditory Comprehension: Impaired Commands: Impaired One Step Basic Commands: 75-100% accurate    Expression Expression Primary Mode of Expression: Verbal Verbal Expression Overall Verbal Expression: Appears within functional limits for tasks assessed   Oral / Motor  Oral Motor/Sensory Function Overall Oral Motor/Sensory Function: Generalized oral weakness (question R facial droop at rest, but moves this side more than her L side when smiling; also question consistency of command following) Motor Speech Overall Motor Speech: Impaired Respiration: Within functional limits Phonation: Low vocal intensity Resonance: Within functional limits Articulation: Impaired Level of Impairment: Sentence Intelligibility: Intelligibility  reduced Word: 75-100% accurate Phrase: 75-100% accurate Sentence: 50-74% accurate Motor Planning: Witnin functional limits             Osie Bond., M.A. [facility] Pager [phone] Office [phone]  08/12/2021, 11:23 AM

## 2021-08-12 NOTE — Plan of Care (Signed)
°  Problem: Education: Goal: Knowledge of General Education information will improve Description: Including pain rating scale, medication(s)/side effects and non-pharmacologic comfort measures Outcome: Progressing   Problem: Health Behavior/Discharge Planning: Goal: Ability to manage health-related needs will improve Outcome: Progressing   Problem: Clinical Measurements: Goal: Ability to maintain clinical measurements within normal limits will improve Outcome: Progressing Goal: Will remain free from infection Outcome: Progressing Goal: Diagnostic test results will improve Outcome: Progressing Goal: Respiratory complications will improve Outcome: Progressing Goal: Cardiovascular complication will be avoided Outcome: Progressing   Problem: Clinical Measurements: Goal: Will remain free from infection Outcome: Progressing   Problem: Clinical Measurements: Goal: Diagnostic test results will improve Outcome: Progressing   Problem: Clinical Measurements: Goal: Respiratory complications will improve Outcome: Progressing   Problem: Clinical Measurements: Goal: Cardiovascular complication will be avoided Outcome: Progressing   Problem: Clinical Measurements: Goal: Cardiovascular complication will be avoided Outcome: Progressing   Problem: Skin Integrity: Goal: Risk for impaired skin integrity will decrease Outcome: Progressing   Problem: Safety: Goal: Ability to remain free from injury will improve Outcome: Progressing   Problem: Pain Managment: Goal: General experience of comfort will improve Outcome: Progressing

## 2021-08-12 NOTE — Progress Notes (Addendum)
PROGRESS NOTE                                                                                                                                                                                                             Patient Demographics:    Julia Banks, is a 81 y.o. female, DOB - 02-12-1941, UVO:[id number]  Outpatient Primary MD for the patient is Ria Bush, MD    LOS - 1  Admit date - 08/11/2021    Chief Complaint  Patient presents with   Code Stroke       Brief Narrative (HPI from H&P)  Julia Banks is a 81 y.o. female with medical history significant of remote breast cancer; CVA with residual L-sided deficits; vascular dementia, on hospice family not desiring any aggressive measures; stage 3 CKD; HTN; HLD; hypothyroidism; OSA on CPAP; and DM presenting with AMS. This AM, she got up about 8 and ate without difficulty.    Apparently on the day of admission her health aide was at her house and giving her a bath when the patient slumped lost consciousness was also found to have low blood pressure with high blood sugars and she was transferred to [facility] for further care.  Of note patient is being followed by hospice of [place].   Subjective:    Julia Banks today in bed, she denies any headache or chest pain, has some abdominal distention and little pain, no nausea, continues to have left arm weakness.   Assessment  & Plan :    Acute left hippocampus stroke along with severe dehydration, hypotension causing metabolic encephalopathy - of note patient is home hospice and appears terminal, goal of care here was comfort and nonaggressive measures according to the chart review.  She has been seen by neurology, no further work-up will be done as the outcome would not change.  Continue aspirin only.  EEG also noted which is nonacute.  2.  Previous history of stroke with left-sided hemiparesis,  advanced vascular dementia.  She has been enrolled into home hospice since middle of 2022 with goal being of comfort measures.  Have discussed her case with her hospice team, will also involve palliative care for clear goals of care.  For now minimal supportive care directed towards comfort.   3.  Severe dehydration, hypotension and hypernatremia.  Placed  on D5W monitor  4.  Ileus noted on CT scan on admission.  Mild pain no Nausea, abdominal exam soft, also suspicion of large stool burden in the rectum, bowel disimpaction, oral Colace, n.p.o. except medications and monitor with supportive care.  Again no heroics are desired she is with home hospice. [facility] - they reviewd her chart and scans - want disimpaction - SMOG enema - if fails they will see her then.  5.  Dyslipidemia.  No further treatment she is hospice with comfort directed care.  6.  Hypothyroidism.  Continue home dose Synthroid.  7.  OSA.  Wears nighttime CPAP.  8.  ? Asp PNA on CT - SLP, Unasyn, monitor.  9. DM Type II with DKA upon admission.  DKA has resolved, since she is n.p.o. have placed her on half home dose Lantus along with every 4 hours sliding scale.  Monitor and adjust.          Condition - Extremely Guarded  Family Communication  :   Called Niece [phone] on 08/12/21 - left message 52 am  Called Sister [phone] on 08/12/21 - left message 11:05 am  Code Status :  DNR  Consults  :  Pall Care - ? GOC  PUD Prophylaxis :  PPI   Procedures  :     CT Head - 1. New small region of edema/possible infarct in the left hippocampus. 2. Chronic small vessel ischemia with old white matter infarcts.  CT ABD - 1. Marked colonic distension involving the tortuous transverse colon. Transition point at the level of the splenic flexure without surrounding stranding or visible mass. Transition well abrupt is without colonic twist or adjacent stranding or thickening and at this time is favored to represent  developing colonic ileus. Given the marked colonic dilation however there is a risk for colonic perforation. Close follow-up and GI consultation may be helpful. 2. Moderately large amount of stool in the rectum could also indicate fecal impaction. Sigmoid colon is however not substantially distended on the current evaluation. 3. Scattered small bowel loops with mild distension as well further supporting the possibility of developing more generalized ileus. 4. LEFT basilar airspace disease, patchy airspace disease at the LEFT lung base, concerning for pneumonia. Small amounts of material in the LEFT lower lobe airways, raising the question of aspiration. 5. Post cholecystectomy without gross biliary duct distension. 6. Aortic atherosclerosis. Aortic Atherosclerosis (ICD10-I70.0).       Disposition Plan  :    Status is: Inpatient   DVT Prophylaxis  :    enoxaparin (LOVENOX) injection 30 mg Start: 08/11/21 1800    Lab Results  Component Value Date   PLT 155 08/11/2021    Diet :  Diet Order             Diet NPO time specified  Diet effective now                    Inpatient Medications  Scheduled Meds:   stroke: mapping our early stages of recovery book   Does not apply Once   aspirin  300 mg Rectal Daily   enoxaparin (LOVENOX) injection  30 mg Subcutaneous Q24H   insulin aspart  0-15 Units Subcutaneous Q4H   insulin glargine-yfgn  25 Units Subcutaneous BID   Continuous Infusions:  dextrose 110 mL/hr at 08/12/21 0631   PRN Meds:.haloperidol lactate, hydrALAZINE, ondansetron (ZOFRAN) IV  Antibiotics  :    Anti-infectives (From admission, onward)  None        Time Spent in minutes  30   Lala Lund M.D on 08/12/2021 at 11:13 AM  To page go to [email or web address]   [facility] -  Office  [phone]  See all Orders from today for further details    Objective:   Vitals:   08/11/21 2025 08/12/21 0000 08/12/21 0403 08/12/21 0757  BP: (!) 150/59 (!)  136/56 (!) 159/91 (!) 173/81  Pulse: (!) 52 (!) 57 (!) 54 61  Resp: 18 18 14 16   Temp: 98.5 F (36.9 C) 98.3 F (36.8 C) 98.1 F (36.7 C) 98 F (36.7 C)  TempSrc: Oral Oral Oral Oral  SpO2: 97% 95% 95% 97%  Weight:      Height:        Wt Readings from Last 3 Encounters:  08/11/21 74.2 kg  11/09/20 85.3 kg  09/26/19 85.3 kg    No intake or output data in the 24 hours ending 08/12/21 1113   Physical Exam  Awake Alert, chronic left arm weakness with contracture, [place].AT,PERRAL Supple Neck, No JVD,   Symmetrical Chest wall movement, Good air movement bilaterally, CTAB RRR,No Gallops,Rubs or new Murmurs,  +ve B.Sounds, Abd Soft but does have minimal generalized tenderness No Cyanosis, Clubbing or edema     RN pressure injury documentation: Pressure Injury 08/11/21 Buttocks Mid Stage 2 -  Partial thickness loss of dermis presenting as a shallow open injury with a red, pink wound bed without slough. (Active)  08/11/21 2025  Location: Buttocks  Location Orientation: Mid  Staging: Stage 2 -  Partial thickness loss of dermis presenting as a shallow open injury with a red, pink wound bed without slough.  Wound Description (Comments):   Present on Admission: Yes     Pressure Injury 08/11/21 Heel Left Stage 2 -  Partial thickness loss of dermis presenting as a shallow open injury with a red, pink wound bed without slough. (Active)  08/11/21 2025  Location: Heel  Location Orientation: Left  Staging: Stage 2 -  Partial thickness loss of dermis presenting as a shallow open injury with a red, pink wound bed without slough.  Wound Description (Comments):   Present on Admission: Yes     Data Review:    CBC Recent Labs  Lab 08/11/21 1237 08/11/21 1300 08/11/21 1719  WBC  --  12.8*  --   HGB 15.6* 14.7 14.3  HCT 46.0 48.7* 42.0  PLT  --  155  --   MCV  --  91.9  --   MCH  --  27.7  --   MCHC  --  30.2  --   RDW  --  14.1  --   LYMPHSABS  --  1.3  --   MONOABS  --  0.8   --   EOSABS  --  0.0  --   BASOSABS  --  0.0  --     Electrolytes Recent Labs  Lab 08/11/21 1237 08/11/21 1300 08/11/21 1712 08/11/21 1719 08/11/21 2351 08/12/21 0304  NA 148* 145 148* 149* 153* 155*  K 4.7 4.5 4.0 4.3 3.3* 3.8  CL 118* 111 115*  --  121* 126*  CO2  --  17* 18*  --  20* 18*  GLUCOSE >700* 982* 781*  --  242* 180*  BUN 61* 63* 59*  --  53* 51*  CREATININE 2.10* 2.30* 2.10*  --  1.85* 1.75*  CALCIUM  --  9.7 9.3  --  9.8 9.7  AST  --  45*  --   --   --   --   ALT  --  38  --   --   --   --   ALKPHOS  --  97  --   --   --   --   BILITOT  --  0.8  --   --   --   --   ALBUMIN  --  4.1  --   --   --   --   MG  --   --   --   --   --  2.2  INR  --  1.1  --   --   --   --   AMMONIA  --   --   --   --  44*  --     ------------------------------------------------------------------------------------------------------------------ No results for input(s): CHOL, HDL, LDLCALC, TRIG, CHOLHDL, LDLDIRECT in the last 72 hours.  Lab Results  Component Value Date   HGBA1C 7.2 (H) 10/04/2020    No results for input(s): TSH, T4TOTAL, T3FREE, THYROIDAB in the last 72 hours.  Invalid input(s): FREET3 ------------------------------------------------------------------------------------------------------------------ ID Labs Recent Labs  Lab 08/11/21 1237 08/11/21 1300 08/11/21 1712 08/11/21 2351 08/12/21 0304  WBC  --  12.8*  --   --   --   PLT  --  155  --   --   --   CREATININE 2.10* 2.30* 2.10* 1.85* 1.75*   Cardiac Enzymes No results for input(s): CKMB, TROPONINI, MYOGLOBIN in the last 168 hours.  Invalid input(s): CK   Radiology Reports CT Abdomen Pelvis Wo Contrast  Result Date: 08/11/2021 CLINICAL DATA:  Acute nonlocalized abdominal pain in a 81 year old female. EXAM: CT ABDOMEN AND PELVIS WITHOUT CONTRAST TECHNIQUE: Multidetector CT imaging of the abdomen and pelvis was performed following the standard protocol without IV contrast. RADIATION DOSE  REDUCTION: This exam was performed according to the departmental dose-optimization program which includes automated exposure control, adjustment of the mA and/or kV according to patient size and/or use of iterative reconstruction technique. COMPARISON:  None FINDINGS: Lower chest: LEFT basilar airspace disease, patchy airspace disease at the LEFT lung base. Small amounts of material in the LEFT lower lobe airways. The three-vessel coronary artery disease. RIGHT lung is clear. Hepatobiliary: Unremarkable appearance of the liver with smooth contours. Post cholecystectomy without gross biliary duct distension. Pancreas: Pancreatic atrophy without signs of inflammation. Spleen: Normal size and contour. Adrenals/Urinary Tract: Adrenal glands are normal. No perivesical stranding. Mild perinephric stranding with signs of cortical scarring. No hydronephrosis. Stomach/Bowel: Stomach is under distended limiting assessment. No signs of small bowel obstruction, some mildly distended loops are scattered throughout the abdomen. Appendix not visualized, no secondary signs to suggest acute appendicitis. Marked colonic distension involving the anti dependent: In the anterior abdomen, tortuous transverse colon. Transition point at the level of the splenic flexure without surrounding stranding or visible mass. No visible colonic twist. Colonic loops are of more normal caliber in the sigmoid becoming moderately distended however in the rectum with moderate to marked amount of formed stool focally in the rectum. No signs of perirectal stranding. Degree of distension of the transverse colon is up to 7 cm AP diameter and 8 cm transverse. Again the level of transition is abrupt but without clear cause. Vascular/Lymphatic: Aortic atherosclerosis without aneurysmal dilation. No signs of adenopathy in the abdomen or in the pelvis. Reproductive: Unremarkable post hysterectomy appearance of the pelvis. Other: No signs of pneumatosis. No free  intraperitoneal air.  No pericolonic stranding or wall thickening. Musculoskeletal: No acute bone finding. No destructive bone process. Spinal degenerative changes. Osteopenia. IMPRESSION: 1. Marked colonic distension involving the tortuous transverse colon. Transition point at the level of the splenic flexure without surrounding stranding or visible mass. Transition well abrupt is without colonic twist or adjacent stranding or thickening and at this time is favored to represent developing colonic ileus. Given the marked colonic dilation however there is a risk for colonic perforation. Close follow-up and GI consultation may be helpful. 2. Moderately large amount of stool in the rectum could also indicate fecal impaction. Sigmoid colon is however not substantially distended on the current evaluation. 3. Scattered small bowel loops with mild distension as well further supporting the possibility of developing more generalized ileus. 4. LEFT basilar airspace disease, patchy airspace disease at the LEFT lung base, concerning for pneumonia. Small amounts of material in the LEFT lower lobe airways, raising the question of aspiration. 5. Post cholecystectomy without gross biliary duct distension. 6. Aortic atherosclerosis. Aortic Atherosclerosis (ICD10-I70.0). Electronically Signed   By: Zetta Bills M.D.   On: 08/11/2021 18:57   DG Chest Port 1 View  Result Date: 08/11/2021 CLINICAL DATA:  Patient became weak and unresponsive during a bath today. EXAM: PORTABLE CHEST 1 VIEW COMPARISON:  07/18/2014. FINDINGS: There cardiac silhouette is normal in size. No mediastinal or hilar masses. Clear lungs.  No convincing pleural effusion.  No pneumothorax. Skeletal structures are demineralized, but grossly intact. IMPRESSION: No active disease. Electronically Signed   By: Lajean Manes M.D.   On: 08/11/2021 13:32   EEG adult  Result Date: 08/12/2021 Robyne Peers, MD     08/12/2021 12:30 AM [facility]  Consult Services Routine EEG Report Date and Time of Study: 08/11/21 at 1627. Duration: 23 min Indication: Altered mentation. Technical Summary: A routine 20-channel electroencephalogram using the international 10-20 system of electrode placement was performed. Background: The best posterior dominant rhythm (PDR) identified was 7 Hz. The background consisted predominantly of symmetric theta slowing (6-7 Hz). No focal slowing was seen. The background was reactive to stimulation. States: Awake. Sleep was not seen during this EEG. Activation Procedures: Hyperventilation: Not performed Photic Stimulation: Not performed Classification: abnormal due to: 1. Symmetric theta slowing. Diagnosis: This is an abnormal EEG due to mild generalized slowing. Clinical Correlation: This is an abnormal EEG. The presence of mild generalized slowing is consistent with mild diffuse cortical dysfunction and mild non-specific encephalopathy. There were no epileptiform discharges seen. No seizures were seen. Allena Earing, MD CNP/Epilepsy [facility] [phone] Case: [id number]  CT HEAD CODE STROKE WO CONTRAST  Result Date: 08/11/2021 CLINICAL DATA:  Code stroke. Neuro deficit, acute, stroke suspected. EXAM: CT HEAD WITHOUT CONTRAST TECHNIQUE: Contiguous axial images were obtained from the base of the skull through the vertex without intravenous contrast. RADIATION DOSE REDUCTION: This exam was performed according to the departmental dose-optimization program which includes automated exposure control, adjustment of the mA and/or kV according to patient size and/or use of iterative reconstruction technique. COMPARISON:  Head MRI 08/08/2019 FINDINGS: Brain: There is a new small region of low-density/edema in the left hippocampus. A chronic infarct is again noted posteriorly in the right basal ganglia. A lacunar infarct in the left corona radiata is new but also chronic in appearance. No intracranial hemorrhage, midline shift, or  extra-axial fluid collection is identified. Mild cerebral atrophy is within normal limits for age. Hypodensities in the cerebral white matter bilaterally are nonspecific but compatible with mild chronic small vessel  ischemic disease. Vascular: Calcified atherosclerosis at the skull base. No hyperdense vessel. Skull: No acute fracture or suspicious osseous lesion. Sinuses/Orbits: Visualized paranasal sinuses and mastoid air cells are clear. Unremarkable orbits. Other: None. IMPRESSION: 1. New small region of edema/possible infarct in the left hippocampus. 2. Chronic small vessel ischemia with old white matter infarcts. These results were communicated to Dr. Leonel Ramsay at 12:51 pm on 08/11/2021 by text page via the [facility] messaging system. Electronically Signed   By: Logan Bores M.D.   On: 08/11/2021 12:52

## 2021-08-12 NOTE — Progress Notes (Addendum)
[id number]) hospitalized hospice patient visit.  This is a current [facility] hospice patient with a terminal diagnosis of Cerebral Atherosclerosis.  Patient became unresponsive during her bath on 08/11/21 and family activated EMS. Patient was transported to ED and  admitted with diagnosis of pneumonia and stroke on 08/11/21. Per [facility] physician, Dr Orpah Melter this is a related hospital admission.   Visited at bedside with niece [name] present. Patient was sleeping and did not wake to my voice. She did not appear in distress. She had a notable left arm contracture of new onset. Niece believes patient may have been have mini strokes over last week.   This patient is appropriate for inpatient due to need for IV fluids and antibiotics.   VS: 97.8, 147/69, 70, 16, 95% RA I/O: not documented  Abnormal labs: Potassium: 3.8 Chloride: 126 (H) CO2: 18 (L) Glucose: 180 (H) BUN: 51 (H) Creatinine: 1.75 (H) GFR, Estimated: 29 (L) 08/11/21 23:51 Ammonia: 44 (H) 08/11/21 13:00 WBC: 12.8 (H) RBC: 5.30 (H) HCT: 48.7 (H) 08/11/21 13:00 NEUT#: 10.5 (H) Abs Immature Granulocytes: 0.09 (H) 08/11/21 13:00 Beta-Hydroxybutyric Acid: 0.70 (H) Glucose: 982 (HH)  Diagnostics: CT abd/pelvis IMPRESSION: 1. Marked colonic distension involving the tortuous transverse colon. Transition point at the level of the splenic flexure without surrounding stranding or visible mass. Transition well abrupt is without colonic twist or adjacent stranding or thickening and at this time is favored to represent developing colonic ileus. Given the marked colonic dilation however there is a risk for colonic perforation. Close follow-up and GI consultation may be helpful. 2. Moderately large amount of stool in the rectum could also indicate fecal impaction. Sigmoid colon is however not substantially distended on the current evaluation. 3. Scattered small bowel loops with mild distension as well  further supporting the possibility of developing more generalized ileus. 4. LEFT basilar airspace disease, patchy airspace disease at the LEFT lung base, concerning for pneumonia. Small amounts of material in the LEFT lower lobe airways, raising the question of aspiration. 5. Post cholecystectomy without gross biliary duct distension. 6. Aortic atherosclerosis.  IV/PRN medications: Unasyn 3g IVPB BID, D5 110/ml/hr  Problem list: Acute left hippocampus stroke along with severe dehydration, hypotension causing metabolic encephalopathy - of note patient is home hospice and appears terminal, goal of care here was comfort and nonaggressive measures according to the chart review.  She has been seen by neurology, no further work-up will be done as the outcome would not change.  Continue aspirin only.  EEG also noted which is nonacute.   2.  Previous history of stroke with left-sided hemiparesis, advanced vascular dementia.  She has been enrolled into home hospice since middle of 2022 with goal being of comfort measures.  Have discussed her case with her hospice team, will also involve palliative care for clear goals of care.  For now minimal supportive care directed towards comfort.     3.  Severe dehydration, hypotension and hypernatremia.  Placed on D5W monitor   4.  Ileus noted on CT scan on admission.  Mild pain no Nausea, abdominal exam soft, also suspicion of large stool burden in the rectum, bowel disimpaction, oral Colace, n.p.o. except medications and monitor with supportive care.  Again no heroics are desired she is with home hospice. [facility] - they reviewd her chart and scans - want disimpaction - SMOG enema - if fails they will see her then.   5.  Dyslipidemia.  No further treatment she is hospice with  comfort directed care.   6.  Hypothyroidism.  Continue home dose Synthroid.   7.  OSA.  Wears nighttime CPAP.   8.  ? Asp PNA on CT - SLP, Unasyn, monitor.   9. DM Type II with  DKA upon admission.  DKA has resolved, since she is n.p.o. have placed her on half home dose Lantus along with every 4 hours sliding scale.  Monitor and adjust.  Discharge planning: Return home with hospice when stable for discharge.   Family contact: Spoke with niece [name] at bedside.  IDG: team update  Goals of care:  DNR. Treat infection but no aggressive therapies or surgeries.   Medication list and transfer summary placed on shadow chart.   Please used [facility] for ambulance transport as this services is contracted for all [facility] active hospice patients.  A Please do not hesitate to call with questions.   Thank you,   Farrel Gordon, RN, [place] [facility] Liaison   [phone]

## 2021-08-12 NOTE — Progress Notes (Signed)
Per Dr Candiss Norse, Deno Etienne. RN to give glargine (Semglee) and stop IV insulin drip 1 hour after.

## 2021-08-12 NOTE — Progress Notes (Signed)
Pt Sinus Bradycardia HR 48-52. Per Niece [name], Pt is NSR at home 80-90. Provider  Mitzi Hansen MD notified. No new orders

## 2021-08-12 NOTE — Progress Notes (Signed)
Pharmacy Antibiotic Note  Julia Banks is a 81 y.o. female admitted on 08/11/2021 with acute stroke.  Pharmacy has been consulted for Unasyn dosing for aspiration PNA.  Plan: Unasyn  3 g IV q12h Monitor clinical status, renal function and culture results daily.    Height: 5\' 6"  (167.6 cm) Weight: 74.2 kg (163 lb 9.3 oz) IBW/kg (Calculated) : 59.3  Temp (24hrs), Avg:98.1 F (36.7 C), Min:97.8 F (36.6 C), Max:98.5 F (36.9 C)  Recent Labs  Lab 08/11/21 1237 08/11/21 1300 08/11/21 1712 08/11/21 2351 08/12/21 0304  WBC  --  12.8*  --   --   --   CREATININE 2.10* 2.30* 2.10* 1.85* 1.75*    Estimated Creatinine Clearance: 27.3 mL/min (A) (by C-G formula based on SCr of 1.75 mg/dL (H)).    Allergies  Allergen Reactions   Namenda [Memantine] Other (See Comments)    migraine   Repatha [Evolocumab] Other (See Comments)    migraine   Statins Other (See Comments)    myalgias   Zetia [Ezetimibe] Other (See Comments)    Joint pain, memory changes   Altace [Ramipril] Other (See Comments)    Upset stomach   Aspirin Nausea And Vomiting    Ok with 81mg     Metformin And Related Other (See Comments)    GI upset    Antimicrobials this admission: Unasyn  2/13>>   Dose adjustments this admission:   Microbiology results: 2/12 UCx; sent 2/12 covid: neg; flu pcr: neg  Thank you for allowing pharmacy to be a part of this patients care.  Nicole Cella, RPh Clinical Pharmacist [phone] 08/12/2021 2:47 PM  Please check AMION for all [place] phone numbers After 10:00 PM, call [place] [phone]

## 2021-08-12 NOTE — Progress Notes (Signed)
Refused cpap.

## 2021-08-12 NOTE — Progress Notes (Signed)
°  Transition of Care [facility]) Screening Note   Patient Details  Name: Julia Banks Date of Birth: 1940/10/28   Transition of Care [facility]) CM/SW Contact:    Benard Halsted, LCSW Phone Number: 08/12/2021, 1:59 PM    Transition of Care Department [facility]) has reviewed patient who resides at home with [facility]. We will continue to monitor patient advancement through interdisciplinary progression rounds. If new patient transition needs arise, please place a TOC consult.

## 2021-08-12 NOTE — Plan of Care (Signed)

## 2021-08-12 NOTE — Evaluation (Signed)
Clinical/Bedside Swallow Evaluation Patient Details  Name: Julia Banks MRN: [id number] Date of Birth: 07-Nov-1940  Today's Date: 08/12/2021 Time: SLP Start Time (ACUTE ONLY): 1027 SLP Stop Time (ACUTE ONLY): 2229 SLP Time Calculation (min) (ACUTE ONLY): 10 min  Past Medical History:  Past Medical History:  Diagnosis Date   Arthritis    Asthma    Breast cancer, left breast ([place]) 02/2014   DCIS, ER+ PR- initially rec bilat mastectomy with sentinal LN biopsy L axilla but now planning just L mastectomy [name])   Chronic kidney disease, stage 3 ([place])    Colon polyps    benign   Complication of anesthesia    hard to wake up   Coronary artery disease    30% stenosis reported on previous cardiac catheterization in 2006. This was done in [facility]   COVID-19 virus infection 07/2019   GERD (gastroesophageal reflux disease)    Headaches, cluster    Hyperlipidemia    Hypertension    Hypothyroidism    Osteopenia 07/2014   DEXA 0.5 spine, 01.7 R femoral neck   Pneumonia    years ago   Secondary hyperparathyroidism of renal origin ([place])    likely   Sensorineural hearing loss (SNHL) of both ears 01/27/2016   Audiology eval - moderately severe sensorineural hearing loss bilaterally rec amplification (07/2016 - [facility] [name] audiologist)   Sleep apnea    on CPAP   Type 2 diabetes, uncontrolled, with neuropathy    followed by endo Dr Buddy Duty   Past Surgical History:  Past Surgical History:  Procedure Laterality Date   ABDOMINAL HYSTERECTOMY     heavy bleeding, ovaries remain   APPENDECTOMY     BREAST BIOPSY Right    benign   BREAST BIOPSY Left 02/2014   ?DCIS   CARDIAC CATHETERIZATION  2006   [place], [place]  10/2013   mild 2v nonobstructive disease, EF 60%   CARDIOVASCULAR STRESS TEST  09/2012   WNL ([place])   CHOLECYSTECTOMY     COLONOSCOPY  05/2010   2 TAs, diverticulosis, rpt 2 yrs (Dr Elissa Hefty in [place])    COLONOSCOPY  12/2014   1 TA, rpt 5 yrs Henrene Pastor)   cortisone right knee  01/04/15   GALLBLADDER SURGERY     MASTECTOMY W/ SENTINEL NODE BIOPSY Left 06/07/2014   DR WAKEFIELD   SIMPLE MASTECTOMY WITH AXILLARY SENTINEL NODE BIOPSY Left 06/07/2014   Procedure: LEFT TOTAL MASTECTOMY WITH LEFT AXILLARY SENTINEL NODE BIOPSY;  Surgeon: Rolm Bookbinder, MD;  Location: [facility];  Service: General;  Laterality: Left;   HPI:  Pt is a 81 yo female presenting with AMS after an episode of unresponsiveness. She is enrolled in hospice due to decline, falls. Work up underway; found to be in mild DKA. CTH showed a new small region of edema/possible infarct in the L hippocampus. CXR with no active disease, although CT abdomen revealed L basilar airspace disease concerning for PNA and small amounts of material in thr airway concerning for aspiration. Per MD H&P note, pt had a h/o dysphagia after stroke in 2020 and needed to use thickened liquids, but she had improved and no longer used them PTA. However, family had noted coughing when pt tried to drink reclined at home. SLP eval from that time only for cognition (24/30 on MOCA but at her baseline). PMH also includes: CVA with residual L weakness, vascular dementia, GERD, PNA ("years ago"), SNHL, OSA, DMII, COVID, remote breast ca,  CKD, HTN, HLD, hypothyroidism    Assessment / Plan / Recommendation  Clinical Impression  Pt has signs of dysphagia with concern for possible aspiration. She has questionable facial asymmetry, with suspected R sided weakness at rest, but then then moving this side more when prompted to smile. No anterior loss was noted out of either side when consuming liquids by spoon or straw. Pt has oral holding across consistencies, but does respond to verbal cues to swallow. She has multiple swallows and facial grimacing as well, with immediate coughing noted after thin liquids. Her volitional and reflexive coughing both sound very weak, and pt has c/o not being  able to cough well. There is also concern for aspiration based upon her CT. Pt is at risk for dysphagia related adverse events, but SLP was not able to reach her niece via phone to discuss how much she may want to pursue additional testing. For now, would focus on oral hygiene followed by ice chips, with meds crushed in puree. MBS could be completed if within [place]. If her niece would prefer to begin a PO diet with known risk, could offer any consistencies, but may consider Dys 1 (puree) diet and nectar thick liquids, which did not elicit coughing in the same way that thin liquids did. SLP Visit Diagnosis: Dysphagia, unspecified (R13.10)    Aspiration Risk  Moderate aspiration risk;Risk for inadequate nutrition/hydration    Diet Recommendation NPO except meds;Ice chips PRN after oral care   Medication Administration: Crushed with puree    Other  Recommendations Oral Care Recommendations: Oral care QID    Recommendations for follow up therapy are one component of a multi-disciplinary discharge planning process, led by the attending physician.  Recommendations may be updated based on patient status, additional functional criteria and insurance authorization.  Follow up Recommendations  (TBA pending GOC)      Assistance Recommended at Discharge Frequent or constant Supervision/Assistance  Functional Status Assessment Patient has had a recent decline in their functional status and/or demonstrates limited ability to make significant improvements in function in a reasonable and predictable amount of time  Frequency and Duration min 2x/week  2 weeks       Prognosis Prognosis for Safe Diet Advancement: Fair Barriers to Reach Goals: Other (Comment);Cognitive deficits (overall medical prognosis, on hospice care PTA)      Swallow Study   General HPI: Pt is a 81 yo female presenting with AMS after an episode of unresponsiveness. She is enrolled in hospice due to decline, falls. Work up underway;  found to be in mild DKA. CTH showed a new small region of edema/possible infarct in the L hippocampus. CXR with no active disease, although CT abdomen revealed L basilar airspace disease concerning for PNA and small amounts of material in thr airway concerning for aspiration. Per MD H&P note, pt had a h/o dysphagia after stroke in 2020 and needed to use thickened liquids, but she had improved and no longer used them PTA. However, family had noted coughing when pt tried to drink reclined at home. SLP eval from that time only for cognition (24/30 on MOCA but at her baseline). PMH also includes: CVA with residual L weakness, vascular dementia, GERD, PNA ("years ago"), SNHL, OSA, DMII, COVID, remote breast ca, CKD, HTN, HLD, hypothyroidism Type of Study: Bedside Swallow Evaluation Previous Swallow Assessment: none in chart Diet Prior to this Study: NPO Temperature Spikes Noted: No Respiratory Status: Room air History of Recent Intubation: No Behavior/Cognition: Lethargic/Drowsy;Cooperative;Pleasant mood;Requires cueing Oral Cavity Assessment:  Dry Oral Care Completed by SLP: No Oral Cavity - Dentition: Adequate natural dentition Vision:  (mostly keeps eyes closed) Self-Feeding Abilities: Needs assist Patient Positioning: Upright in bed Baseline Vocal Quality: Low vocal intensity Volitional Cough: Weak;Congested Volitional Swallow: Able to elicit    Oral/Motor/Sensory Function Overall Oral Motor/Sensory Function: Generalized oral weakness (question R facial droop at rest, but moves this side more than her L side when smiling; also question consistency of command following)   Ice Chips Ice chips: Impaired Presentation: Spoon Oral Phase Functional Implications: Oral holding   Thin Liquid Thin Liquid: Impaired Presentation: Spoon;Straw Oral Phase Functional Implications: Oral holding Pharyngeal  Phase Impairments: Suspected delayed Swallow;Multiple swallows;Cough - Immediate    Nectar Thick Nectar  Thick Liquid: Impaired Presentation: Straw Oral phase functional implications: Oral holding Pharyngeal Phase Impairments: Multiple swallows   Honey Thick Honey Thick Liquid: Not tested   Puree Puree: Impaired Presentation: Spoon Oral Phase Functional Implications: Oral holding Pharyngeal Phase Impairments: Multiple swallows   Solid     Solid: Not tested      Osie Bond., M.A. [place] Pager [phone] Office [phone]  08/12/2021,11:13 AM

## 2021-08-12 NOTE — Procedures (Signed)
[facility]  Routine EEG Report  Date and Time of Study: 08/11/21 at 1627. Duration: 23 min  Indication: Altered mentation.  Technical Summary: A routine 20-channel electroencephalogram using the international 10-20 system of electrode placement was performed.  Background: The best posterior dominant rhythm (PDR) identified was 7 Hz. The background consisted predominantly of symmetric theta slowing (6-7 Hz). No focal slowing was seen. The background was reactive to stimulation.  States: Awake. Sleep was not seen during this EEG.  Activation Procedures:  Hyperventilation: Not performed  Photic Stimulation: Not performed  Classification: abnormal due to:  1. Symmetric theta slowing.   Diagnosis: This is an abnormal EEG due to mild generalized slowing.  Clinical Correlation: This is an abnormal EEG. The presence of mild generalized slowing is consistent with mild diffuse cortical dysfunction and mild non-specific encephalopathy. There were no epileptiform discharges seen. No seizures were seen.  Allena Earing, MD CNP/Epilepsy [facility] [phone] Case: [id number]

## 2021-08-12 NOTE — Evaluation (Signed)
Occupational Therapy Evaluation Patient Details Name: Julia Banks MRN: [id number] DOB: 1941-01-27 Today's Date: 08/12/2021   History of Present Illness 81 yo female presenting 08/11/21 with AMS after an episode of unresponsiveness. She is enrolled in hospice due to decline, falls. Work up underway; found to be in mild DKA. CTH showed a new small region of edema/possible infarct in the L hippocampus. EEG showed non-specific encephalopathy; CT abdomen revealed L basilar airspace disease concerning for PNA. PMH also includes: CVA with residual L weakness, vascular dementia, PNA ("years ago"), DMII, COVID, remote breast ca, CKD, HTN, HLD. Per H&P, "she cannot walk, feeds herself with her R arm, sit-to-stand and hoyer lift for transfers at home."   Clinical Impression   OT eval initiated and completed, pt is at total A baseline with ADLs/selfcare and mobility. All education has been completed and the patient/family has no further questions. Patient was able to stand with +2 assist with stedy, which is the device they most often use for toileting, etc. No further acute OT services are indicated at this time, OT will sign off   Recommendations for follow up therapy are one component of a multi-disciplinary discharge planning process, led by the attending physician.  Recommendations may be updated based on patient status, additional functional criteria and insurance authorization.   Follow Up Recommendations  No OT follow up    Assistance Recommended at Discharge Frequent or constant Supervision/Assistance  Patient can return home with the following A lot of help with bathing/dressing/bathroom;Two people to help with walking and/or transfers;Direct supervision/assist for medications management;Assist for transportation;Help with stairs or ramp for entrance;Assistance with feeding    Functional Status Assessment  Patient has not had a recent decline in their functional status  Equipment  Recommendations  None recommended by OT    Recommendations for Other Services       Precautions / Restrictions Precautions Precautions: Fall Precaution Comments: denies recent falls (none since began using lifts) Required Braces or Orthoses: Other Brace Other Brace: uses a resting handsplint at night for LUE Restrictions Weight Bearing Restrictions: No      Mobility Bed Mobility Overal bed mobility: Needs Assistance Bed Mobility: Rolling, Sidelying to Sit, Sit to Sidelying Rolling: Total assist, +2 for physical assistance Sidelying to sit: Total assist, +2 for physical assistance     Sit to sidelying: Total assist, +2 for physical assistance General bed mobility comments: per niece, she does not really help with getting up to EOB; takes 2 person assist at home    Transfers Overall transfer level: Needs assistance Equipment used: Ambulation equipment used Transfers: Sit to/from Stand             General transfer comment: bed elevated to allow stedy lift to reach pt; second stand from seat of stedy with +2 min assist; in standing remains forward flexed (more so than usual per niece)      Balance Overall balance assessment: Needs assistance   Sitting balance-Leahy Scale: Poor Sitting balance - Comments: min assist to maintain balance in sitting Postural control: Right lateral lean Standing balance support: Single extremity supported, During functional activity Standing balance-Leahy Scale: Poor                             ADL either performed or assessed with clinical judgement   ADL  General ADL Comments: pt at baseline total A with bathing, dressing, grooming, toileting. Pt feeds herslef with built up grip utensils     Vision Patient Visual Report: No change from baseline       Perception     Praxis      Pertinent Vitals/Pain Pain Assessment Pain Assessment: No/denies pain      Hand Dominance Right   Extremity/Trunk Assessment Upper Extremity Assessment Upper Extremity Assessment: Generalized weakness;LUE deficits/detail LUE Deficits / Details: L elbow with increased tone, L hand/wrist contractures from previous CVA, has resting hand splint at home   Lower Extremity Assessment Lower Extremity Assessment: Generalized weakness;LLE deficits/detail LLE Deficits / Details: s/p CVA but with full PROM; uses functionally in weightbearing to transfer   Cervical / Trunk Assessment Cervical / Trunk Assessment: Other exceptions Cervical / Trunk Exceptions: overweight   Communication Communication Communication: HOH   Cognition Arousal/Alertness: Lethargic Behavior During Therapy: Flat affect Overall Cognitive Status: Impaired/Different from baseline Area of Impairment: Orientation, Attention, Awareness                 Orientation Level: Place, Time, Situation             General Comments: slower to respond and to speak per niece     General Comments  Niece present to provide prior functional status    Exercises     Shoulder Instructions      Home Living Family/patient expects to be discharged to:: Private residence Living Arrangements: Other relatives Available Help at Discharge: Family;Available 24 hours/day;Personal care attendant Type of Home: House       Home Layout: Two level;Able to live on main level with bedroom/bathroom     Bathroom Shower/Tub: Occupational psychologist: Handicapped height Bathroom Accessibility: Yes   Home Equipment: Financial controller: Feeding equipment (Pt feeds herslef with built up grip utensils, hoyer, stand lift)        Prior Functioning/Environment Prior Level of Function : Needs assist       Physical Assist : Mobility (physical);ADLs (physical) Mobility (physical): Bed mobility;Transfers ADLs (physical):  Feeding;Grooming;Bathing;Dressing;Toileting;IADLs Mobility Comments: pt used hoyer lift only for into bed transfers; up OOB every day; uses stedy for sit to stand transfers (to toilet or shower) 4-5 times per day ADLs Comments: could feed herself after set up using RUE; dependent for all other ADLs and IADLs        OT Problem List: Impaired balance (sitting and/or standing);Impaired tone;Decreased coordination;Impaired UE functional use      OT Treatment/Interventions:      OT Goals(Current goals can be found in the care plan section) Acute Rehab OT Goals Patient Stated Goal: return home  OT Frequency:      Co-evaluation PT/OT/SLP Co-Evaluation/Treatment: Yes Reason for Co-Treatment: Complexity of the patient's impairments (multi-system involvement);For patient/therapist safety;To address functional/ADL transfers PT goals addressed during session: Mobility/safety with mobility;Balance;Proper use of DME OT goals addressed during session: ADL's and self-care;Proper use of Adaptive equipment and DME      AM-PAC OT "6 Clicks" Daily Activity     Outcome Measure Help from another person eating meals?: A Little Help from another person taking care of personal grooming?: Total Help from another person toileting, which includes using toliet, bedpan, or urinal?: Total Help from another person bathing (including washing, rinsing, drying)?: Total Help from another person to put on and taking off regular upper body clothing?: Total Help from another person to put on and taking off regular lower body clothing?:  Total 6 Click Score: 8   End of Session Equipment Utilized During Treatment: Other (comment) Charlaine Dalton)  Activity Tolerance: Patient tolerated treatment well Patient left: in bed;with call bell/phone within reach;with bed alarm set;with family/visitor present                   Time: 1349-1419 OT Time Calculation (min): 30 min Charges:  OT General Charges $OT Visit: 1 Visit OT  Evaluation $OT Eval Moderate Complexity: 1 Mod    Britt Bottom 08/12/2021, 4:10 PM

## 2021-08-12 NOTE — Progress Notes (Signed)
Neurology Update:  LTM: mild slowing. No seizures.  Will sign off, call with questions.

## 2021-08-12 NOTE — Evaluation (Signed)
Physical Therapy Evaluation and Discharge Patient Details Name: Julia Banks MRN: [id number] DOB: Jan 06, 1941 Today's Date: 08/12/2021  History of Present Illness  81 yo female presenting 08/11/21 with AMS after an episode of unresponsiveness. She is enrolled in hospice due to decline, falls. Work up underway; found to be in mild DKA. CTH showed a new small region of edema/possible infarct in the L hippocampus. EEG showed non-specific encephalopathy; CT abdomen revealed L basilar airspace disease concerning for PNA. PMH also includes: CVA with residual L weakness, vascular dementia, PNA ("years ago"), DMII, COVID, remote breast ca, CKD, HTN, HLD. Per H&P, "she cannot walk, feeds herself with her R arm, sit-to-stand and hoyer lift for transfers at home."  Clinical Impression   Patient evaluated by Physical Therapy with no further acute PT needs identified. All education has been completed and the patient/family has no further questions. Patient was able to stand with +2 assist with stedy, which is the device they most often use for toileting, etc. As she is at her baseline from a mobility perspective, no further PT is indicated and pt/family in agreement.  PT is signing off. Thank you for this referral.        Recommendations for follow up therapy are one component of a multi-disciplinary discharge planning process, led by the attending physician.  Recommendations may be updated based on patient status, additional functional criteria and insurance authorization.  Follow Up Recommendations No PT follow up    Assistance Recommended at Discharge Frequent or constant Supervision/Assistance  Patient can return home with the following  Two people to help with walking and/or transfers;Two people to help with bathing/dressing/bathroom;Direct supervision/assist for medications management;Direct supervision/assist for financial management;Assist for transportation    Equipment Recommendations None  recommended by PT  Recommendations for Other Services       Functional Status Assessment Patient has not had a recent decline in their functional status     Precautions / Restrictions Precautions Precautions: Fall Precaution Comments: denies recent falls (none since began using lifts) Required Braces or Orthoses: Other Brace Other Brace: uses a resting handsplint at night for LUE Restrictions Weight Bearing Restrictions: No      Mobility  Bed Mobility Overal bed mobility: Needs Assistance Bed Mobility: Rolling, Sidelying to Sit, Sit to Sidelying Rolling: Total assist, +2 for physical assistance Sidelying to sit: Total assist, +2 for physical assistance     Sit to sidelying: Total assist, +2 for physical assistance General bed mobility comments: per niece, she does not really help with getting up to EOB; takes 2 person assist at home    Transfers Overall transfer level: Needs assistance Equipment used: Ambulation equipment used (stedy) Transfers: Sit to/from Stand Sit to Stand: Mod assist, +2 physical assistance, From elevated surface           General transfer comment: bed elevated to allow stedy lift to reach pt; second stand from seat of stedy with +2 min assist; in standing remains forward flexed (more so than usual per niece)    Ambulation/Gait               General Gait Details: has not walked in many, many months (was having falls and broke her foot--stopped walking)  Science writer    Modified Rankin (Stroke Patients Only) Modified Rankin (Stroke Patients Only) Pre-Morbid Rankin Score: Severe disability Modified Rankin: Severe disability     Balance Overall balance assessment: Needs assistance Sitting-balance support:  No upper extremity supported, Feet supported Sitting balance-Leahy Scale: Poor Sitting balance - Comments: min assist to maintain balance in sitting Postural control: Right lateral lean Standing  balance support: Single extremity supported, During functional activity Standing balance-Leahy Scale: Poor                               Pertinent Vitals/Pain Pain Assessment Pain Assessment: No/denies pain    Home Living Family/patient expects to be discharged to:: Private residence Living Arrangements: Other relatives Available Help at Discharge: Family;Available 24 hours/day;Personal care attendant (niece) Type of Home: House         Home Layout: Two level;Able to live on main level with bedroom/bathroom Home Equipment: Hospital bed;Other (comment) (hoyer lift; "stedy-like" stander;)      Prior Function Prior Level of Function : Needs assist       Physical Assist : Mobility (physical);ADLs (physical) Mobility (physical): Bed mobility;Transfers ADLs (physical): Feeding;Grooming;Bathing;Dressing;Toileting;IADLs Mobility Comments: pt used hoyer lift only for into bed transfers; up OOB every day; uses stedy for sit to stand transfers (to toilet or shower) 4-5 times per day ADLs Comments: could feed herself after setup using RUE; dependent for all other ADLs and IADLs     Hand Dominance   Dominant Hand: Right    Extremity/Trunk Assessment   Upper Extremity Assessment Upper Extremity Assessment: Defer to OT evaluation    Lower Extremity Assessment Lower Extremity Assessment: Generalized weakness;LLE deficits/detail LLE Deficits / Details: s/p CVA but with full PROM; uses functionally in weightbearing to transfer    Cervical / Trunk Assessment Cervical / Trunk Assessment: Other exceptions Cervical / Trunk Exceptions: overweight  Communication   Communication: HOH  Cognition Arousal/Alertness: Lethargic Behavior During Therapy: Flat affect Overall Cognitive Status: Impaired/Different from baseline Area of Impairment: Orientation, Attention, Awareness                 Orientation Level: Place, Time, Situation (did not recognize her niece earlier  today) Current Attention Level: Sustained       Awareness: Intellectual   General Comments: slower to respond and to speak per niece        General Comments General comments (skin integrity, edema, etc.): Niece present to provide prior functional status    Exercises     Assessment/Plan    PT Assessment Patient does not need any further PT services  PT Problem List         PT Treatment Interventions      PT Goals (Current goals can be found in the Care Plan section)  Acute Rehab PT Goals PT Goal Formulation: All assessment and education complete, DC therapy    Frequency       Co-evaluation PT/OT/SLP Co-Evaluation/Treatment: Yes Reason for Co-Treatment: Complexity of the patient's impairments (multi-system involvement);For patient/therapist safety;To address functional/ADL transfers PT goals addressed during session: Mobility/safety with mobility;Balance;Proper use of DME         AM-PAC PT "6 Clicks" Mobility  Outcome Measure Help needed turning from your back to your side while in a flat bed without using bedrails?: Total Help needed moving from lying on your back to sitting on the side of a flat bed without using bedrails?: Total Help needed moving to and from a bed to a chair (including a wheelchair)?: Total Help needed standing up from a chair using your arms (e.g., wheelchair or bedside chair)?: Total Help needed to walk in hospital room?: Total Help needed climbing 3-5 steps  with a railing? : Total 6 Click Score: 6    End of Session   Activity Tolerance: Patient tolerated treatment well Patient left: in bed;with call bell/phone within reach;with bed alarm set;with family/visitor present Nurse Communication: Mobility status;Other (comment) (at her baseline with PT not going to follow) PT Visit Diagnosis: Other symptoms and signs involving the nervous system (U12.224)    Time: 1146-4314 PT Time Calculation (min) (ACUTE ONLY): 36 min   Charges:   PT  Evaluation $PT Eval Moderate Complexity: [place], PT Acute Rehabilitation Services  Pager [phone] Office [phone]   Rexanne Mano 08/12/2021, 2:34 PM

## 2021-08-13 ENCOUNTER — Inpatient Hospital Stay (HOSPITAL_COMMUNITY)

## 2021-08-13 LAB — URINE CULTURE: Culture: <10000 — AB

## 2021-08-13 LAB — CBC WITH DIFFERENTIAL/PLATELET
Abs Immature Granulocytes: 0.05 10*3/uL (ref 0.00–0.07)
Basophils Absolute: 0 10*3/uL (ref 0.0–0.1)
Basophils Relative: 0 %
Eosinophils Absolute: 0.2 10*3/uL (ref 0.0–0.5)
Eosinophils Relative: 2 %
HCT: 41.4 % (ref 36.0–46.0)
Hemoglobin: 13.5 g/dL (ref 12.0–15.0)
Immature Granulocytes: 0 %
Lymphocytes Relative: 17 %
Lymphs Abs: 2 10*3/uL (ref 0.7–4.0)
MCH: 28.2 pg (ref 26.0–34.0)
MCHC: 32.6 g/dL (ref 30.0–36.0)
MCV: 86.4 fL (ref 80.0–100.0)
Monocytes Absolute: 0.8 10*3/uL (ref 0.1–1.0)
Monocytes Relative: 7 %
Neutro Abs: 8.6 10*3/uL — ABNORMAL HIGH (ref 1.7–7.7)
Neutrophils Relative %: 74 %
Platelets: 151 10*3/uL (ref 150–400)
RBC: 4.79 MIL/uL (ref 3.87–5.11)
RDW: 13.9 % (ref 11.5–15.5)
WBC: 11.7 10*3/uL — ABNORMAL HIGH (ref 4.0–10.5)
nRBC: 0 % (ref 0.0–0.2)

## 2021-08-13 LAB — COMPREHENSIVE METABOLIC PANEL
ALT: 35 U/L (ref 0–44)
AST: 39 U/L (ref 15–41)
Albumin: 3.1 g/dL — ABNORMAL LOW (ref 3.5–5.0)
Alkaline Phosphatase: 70 U/L (ref 38–126)
Anion gap: 8 (ref 5–15)
BUN: 27 mg/dL — ABNORMAL HIGH (ref 8–23)
CO2: 23 mmol/L (ref 22–32)
Calcium: 8.9 mg/dL (ref 8.9–10.3)
Chloride: 117 mmol/L — ABNORMAL HIGH (ref 98–111)
Creatinine, Ser: 1.41 mg/dL — ABNORMAL HIGH (ref 0.44–1.00)
GFR, Estimated: 38 mL/min — ABNORMAL LOW (ref >60)
Glucose, Bld: 142 mg/dL — ABNORMAL HIGH (ref 70–99)
Potassium: 3.1 mmol/L — ABNORMAL LOW (ref 3.5–5.1)
Sodium: 148 mmol/L — ABNORMAL HIGH (ref 135–145)
Total Bilirubin: 0.8 mg/dL (ref 0.3–1.2)
Total Protein: 5.8 g/dL — ABNORMAL LOW (ref 6.5–8.1)

## 2021-08-13 LAB — GLUCOSE, CAPILLARY
Glucose-Capillary: 136 mg/dL — ABNORMAL HIGH (ref 70–99)
Glucose-Capillary: 152 mg/dL — ABNORMAL HIGH (ref 70–99)
Glucose-Capillary: 182 mg/dL — ABNORMAL HIGH (ref 70–99)
Glucose-Capillary: 208 mg/dL — ABNORMAL HIGH (ref 70–99)
Glucose-Capillary: 203 mg/dL — ABNORMAL HIGH (ref 70–99)
Glucose-Capillary: 141 mg/dL — ABNORMAL HIGH (ref 70–99)

## 2021-08-13 LAB — HEMOGLOBIN A1C
Hgb A1c MFr Bld: 11 % — ABNORMAL HIGH (ref 4.8–5.6)
Mean Plasma Glucose: 269 mg/dL

## 2021-08-13 LAB — MAGNESIUM: Magnesium: 1.8 mg/dL (ref 1.7–2.4)

## 2021-08-13 LAB — BRAIN NATRIURETIC PEPTIDE: B Natriuretic Peptide: 120.1 pg/mL — ABNORMAL HIGH (ref 0.0–100.0)

## 2021-08-13 MED ORDER — POTASSIUM CL IN DEXTROSE 5% 20 MEQ/L IV SOLN
20.0000 meq | INTRAVENOUS | Status: DC
  Administered 2021-08-13 (×2): 20 meq via INTRAVENOUS
  Filled 2021-08-13 (×2): qty 1000

## 2021-08-13 MED ORDER — BISACODYL 10 MG RE SUPP
10.0000 mg | Freq: Every day | RECTAL | Status: DC
  Administered 2021-08-13 – 2021-08-14 (×2): 10 mg via RECTAL
  Filled 2021-08-13 (×2): qty 1

## 2021-08-13 MED ORDER — POTASSIUM CHLORIDE 20 MEQ PO PACK
40.0000 meq | PACK | Freq: Once | ORAL | Status: AC
  Administered 2021-08-13: 40 meq via ORAL
  Filled 2021-08-13: qty 2

## 2021-08-13 MED ORDER — LEVOTHYROXINE SODIUM 50 MCG PO TABS
50.0000 ug | ORAL_TABLET | Freq: Every day | ORAL | Status: DC
  Administered 2021-08-13 – 2021-08-14 (×2): 50 ug via ORAL
  Filled 2021-08-13 (×2): qty 1

## 2021-08-13 MED ORDER — LACTULOSE 10 GM/15ML PO SOLN
30.0000 g | Freq: Three times a day (TID) | ORAL | Status: DC
  Administered 2021-08-13 – 2021-08-14 (×5): 30 g via ORAL
  Filled 2021-08-13 (×5): qty 45

## 2021-08-13 MED ORDER — NIACIN 100 MG PO TABS
100.0000 mg | ORAL_TABLET | Freq: Two times a day (BID) | ORAL | Status: DC
  Administered 2021-08-13 – 2021-08-14 (×3): 100 mg via ORAL
  Filled 2021-08-13 (×5): qty 1

## 2021-08-13 MED ORDER — SODIUM CHLORIDE 0.9 % IV SOLN
3.0000 g | Freq: Three times a day (TID) | INTRAVENOUS | Status: DC
  Administered 2021-08-13 – 2021-08-15 (×5): 3 g via INTRAVENOUS
  Filled 2021-08-13 (×4): qty 8

## 2021-08-13 NOTE — Progress Notes (Signed)
Pharmacy Antibiotic Note  Julia Banks is a 81 y.o. female admitted on 08/11/2021 with acute stroke.  Pharmacy consulted on 08/12/21 for Unasyn dosing for aspiration PNA.   Patient with AKI on admit.  SCr has improved, down to 1.41 today.  Estimated CrCl ~34 ml/min.  Will adust Unasyn dosing.   Plan: Adjust Unasyn to 3 g IV every 8 hours. Monitor clinical status, renal function and culture results daily.    Height: 5\' 6"  (167.6 cm) Weight: 74.2 kg (163 lb 9.3 oz) IBW/kg (Calculated) : 59.3  Temp (24hrs), Avg:97.9 F (36.6 C), Min:97.4 F (36.3 C), Max:98.6 F (37 C)  Recent Labs  Lab 08/11/21 1300 08/11/21 1712 08/11/21 2351 08/12/21 0304 08/13/21 0211  WBC 12.8*  --   --   --  11.7*  CREATININE 2.30* 2.10* 1.85* 1.75* 1.41*     Estimated Creatinine Clearance: 33.9 mL/min (A) (by C-G formula based on SCr of 1.41 mg/dL (H)).    Allergies  Allergen Reactions   Namenda [Memantine] Other (See Comments)    migraine   Repatha [Evolocumab] Other (See Comments)    migraine   Statins Other (See Comments)    myalgias   Zetia [Ezetimibe] Other (See Comments)    Joint pain, memory changes   Altace [Ramipril] Other (See Comments)    Upset stomach   Aspirin Nausea And Vomiting    Ok with 81mg     Metformin And Related Other (See Comments)    GI upset    Antimicrobials this admission: Unasyn  2/13>>   Dose adjustments this admission: 2/14 Scr down , Crcl 33.9 ml/mi,  increase Unasyn to 3 g IV q8hr.   Microbiology results: 2/12 UCx: negative 2/12 covid: neg; flu pcr: neg  Thank you for allowing pharmacy to be a part of this patients care.  Nicole Cella, RPh Clinical Pharmacist [phone] 08/13/2021 2:51 PM  Please check AMION for all [place] phone numbers After 10:00 PM, call [place] [phone]

## 2021-08-13 NOTE — Progress Notes (Signed)
PROGRESS NOTE                                                                                                                                                                                                             Patient Demographics:    Julia Banks, is a 81 y.o. female, DOB - [birth year], ZOX:[id number]  Outpatient Primary MD for the patient is Ria Bush, MD    LOS - 2  Admit date - 08/11/2021    Chief Complaint  Patient presents with   Code Stroke       Brief Narrative (HPI from H&P)  Julia Banks is a 81 y.o. female with medical history significant of remote breast cancer; CVA with residual L-sided deficits; vascular dementia, on hospice family not desiring any aggressive measures; stage 3 CKD; HTN; HLD; hypothyroidism; OSA on CPAP; and DM presenting with AMS. This AM, she got up about 8 and ate without difficulty.    Apparently on the day of admission her health aide was at her house and giving her a bath when the patient slumped lost consciousness was also found to have low blood pressure with high blood sugars and she was transferred to [facility] for further care.  Of note patient is being followed by hospice of [facility].   Subjective:   Patient in bed denies any headache chest or abdominal pain overall feels better.  No nausea.   Assessment  & Plan :    Acute left hippocampus stroke along with severe dehydration, hypotension causing metabolic encephalopathy - of note patient is home hospice and appears terminal, goal of care here was comfort and nonaggressive measures according to the chart review.  She has been seen by neurology, no further work-up will be done as the outcome would not change.  Continue aspirin only, will add niacin, note patient has severe statin allergy.  EEG also noted which is nonacute.  2.  Previous history of stroke with left-sided hemiparesis, advanced vascular  dementia.  She has been enrolled into home hospice since middle of 2022 with goal being of comfort measures.  Have discussed her case with her hospice team, also had detailed discussion with patient's power of attorney her niece on 08/12/2021 continue gentle medical treatment avoid heroics and unnecessary testing, discharge with home hospice again.   3.  Severe dehydration,  hypotension, hypokalemia and hypernatremia.  Placed on D5W, replace potassium and monitor.  Clinically improving.  4.  Ileus noted on CT scan on admission.  Mild pain no Nausea, abdominal exam soft, also suspicion of large stool burden in the rectum, better after bowel disimpaction and smog enema, 3 BMs since then, continue bowel regimen and monitor, she has no nausea and repeat imaging is much improved.  5.  Dyslipidemia.  Statin allergy placed on niacin.  6.  Hypothyroidism.  Continue home dose Synthroid.  7.  OSA.  Wears nighttime CPAP.  8.  ? Asp PNA on CT - SLP, Unasyn, monitor.  9. DM Type II with DKA upon admission.  DKA has resolved, since she is n.p.o. have placed her on half home dose Lantus along with every 4 hours sliding scale.  Monitor and adjust.  A1c suggests poor outpatient glycemic control.     Lab Results  Component Value Date   HGBA1C 11.0 (H) 08/11/2021   CBG (last 3)  Recent Labs    08/13/21 0306 08/13/21 0801 08/13/21 1154  GLUCAP 136* 152* 182*         Condition - Extremely Guarded  Family Communication  :   Called Niece [phone] on 08/12/21 - left message 11 am, had detailed discussion later in the day on 08/12/2021, DNR, continue medical treatment eventual discharge with hospice at home.  Called Sister [phone] on 08/12/21 - left message 11:05 am  Code Status :  DNR  Consults  :  Pall Care - ? GOC  PUD Prophylaxis :  PPI   Procedures  :     CT Head - 1. New small region of edema/possible infarct in the left hippocampus. 2. Chronic small vessel ischemia with old white  matter infarcts.  CT ABD - 1. Marked colonic distension involving the tortuous transverse colon. Transition point at the level of the splenic flexure without surrounding stranding or visible mass. Transition well abrupt is without colonic twist or adjacent stranding or thickening and at this time is favored to represent developing colonic ileus. Given the marked colonic dilation however there is a risk for colonic perforation. Close follow-up and GI consultation may be helpful. 2. Moderately large amount of stool in the rectum could also indicate fecal impaction. Sigmoid colon is however not substantially distended on the current evaluation. 3. Scattered small bowel loops with mild distension as well further supporting the possibility of developing more generalized ileus. 4. LEFT basilar airspace disease, patchy airspace disease at the LEFT lung base, concerning for pneumonia. Small amounts of material in the LEFT lower lobe airways, raising the question of aspiration. 5. Post cholecystectomy without gross biliary duct distension. 6. Aortic atherosclerosis. Aortic Atherosclerosis (ICD10-I70.0).       Disposition Plan  :    Status is: Inpatient   DVT Prophylaxis  :    enoxaparin (LOVENOX) injection 30 mg Start: 08/11/21 1800    Lab Results  Component Value Date   PLT 151 08/13/2021    Diet :  Diet Order             Diet full liquid Room service appropriate? Yes; Fluid consistency: Thin  Diet effective now                    Inpatient Medications  Scheduled Meds:   stroke: mapping our early stages of recovery book   Does not apply Once   aspirin  300 mg Rectal Daily   docusate sodium  200 mg  Oral BID   enoxaparin (LOVENOX) injection  30 mg Subcutaneous Q24H   insulin aspart  0-15 Units Subcutaneous Q4H   insulin glargine-yfgn  25 Units Subcutaneous Daily   pantoprazole (PROTONIX) IV  40 mg Intravenous QHS   potassium chloride  40 mEq Oral Once   Continuous Infusions:   ampicillin-sulbactam (UNASYN) IV 3 g (08/13/21 0309)   dextrose 5 % with KCl 20 mEq / L     PRN Meds:.haloperidol lactate, hydrALAZINE, ondansetron (ZOFRAN) IV  Antibiotics  :    Anti-infectives (From admission, onward)    Start     Dose/Rate Route Frequency Ordered Stop   08/12/21 1530  Ampicillin-Sulbactam (UNASYN) 3 g in sodium chloride 0.9 % 100 mL IVPB        3 g 200 mL/hr over 30 Minutes Intravenous Every 12 hours 08/12/21 1440          Time Spent in minutes  30   Lala Lund M.D on 08/13/2021 at 1:04 PM  To page go to [email or web address]   [facility] -  Office  [phone]  See all Orders from today for further details    Objective:   Vitals:   08/13/21 0000 08/13/21 0400 08/13/21 0801 08/13/21 1155  BP: (!) 198/109 (!) 175/76 (!) 193/62 (!) 159/68  Pulse: 75 70 64 70  Resp: 14 18 20 17   Temp: 97.9 F (36.6 C) 98.1 F (36.7 C)  (!) 97.4 F (36.3 C)  TempSrc: Oral Oral Axillary Oral  SpO2: 97% 98% 96% 95%  Weight:      Height:        Wt Readings from Last 3 Encounters:  08/11/21 74.2 kg  11/09/20 85.3 kg  09/26/19 85.3 kg     Intake/Output Summary (Last 24 hours) at 08/13/2021 1304 Last data filed at 08/13/2021 1033 Gross per 24 hour  Intake --  Output 500 ml  Net -500 ml     Physical Exam  Awake Alert x1, chronic left arm weakness with contracture, [place].AT,PERRAL Supple Neck, No JVD,   Symmetrical Chest wall movement, Good air movement bilaterally, CTAB RRR,No Gallops, Rubs or new Murmurs,  +ve B.Sounds, Abd Soft, No tenderness,   No Cyanosis, Clubbing      RN pressure injury documentation: Pressure Injury 08/11/21 Buttocks Mid Stage 2 -  Partial thickness loss of dermis presenting as a shallow open injury with a red, pink wound bed without slough. (Active)  08/11/21 2025  Location: Buttocks  Location Orientation: Mid  Staging: Stage 2 -  Partial thickness loss of dermis presenting as a shallow open injury with a red, pink wound  bed without slough.  Wound Description (Comments):   Present on Admission: Yes     Pressure Injury 08/11/21 Heel Left Stage 2 -  Partial thickness loss of dermis presenting as a shallow open injury with a red, pink wound bed without slough. (Active)  08/11/21 2025  Location: Heel  Location Orientation: Left  Staging: Stage 2 -  Partial thickness loss of dermis presenting as a shallow open injury with a red, pink wound bed without slough.  Wound Description (Comments):   Present on Admission: Yes     Data Review:    CBC Recent Labs  Lab 08/11/21 1237 08/11/21 1300 08/11/21 1719 08/13/21 0211  WBC  --  12.8*  --  11.7*  HGB 15.6* 14.7 14.3 13.5  HCT 46.0 48.7* 42.0 41.4  PLT  --  155  --  151  MCV  --  91.9  --  86.4  MCH  --  27.7  --  28.2  MCHC  --  30.2  --  32.6  RDW  --  14.1  --  13.9  LYMPHSABS  --  1.3  --  2.0  MONOABS  --  0.8  --  0.8  EOSABS  --  0.0  --  0.2  BASOSABS  --  0.0  --  0.0    Electrolytes Recent Labs  Lab 08/11/21 1300 08/11/21 1712 08/11/21 1719 08/11/21 2351 08/12/21 0304 08/12/21 1500 08/13/21 0211  NA 145 148* 149* 153* 155*  --  148*  K 4.5 4.0 4.3 3.3* 3.8  --  3.1*  CL 111 115*  --  121* 126*  --  117*  CO2 17* 18*  --  20* 18*  --  23  GLUCOSE 982* 781*  --  242* 180*  --  142*  BUN 63* 59*  --  53* 51*  --  27*  CREATININE 2.30* 2.10*  --  1.85* 1.75*  --  1.41*  CALCIUM 9.7 9.3  --  9.8 9.7  --  8.9  AST 45*  --   --   --   --   --  39  ALT 38  --   --   --   --   --  35  ALKPHOS 97  --   --   --   --   --  70  BILITOT 0.8  --   --   --   --   --  0.8  ALBUMIN 4.1  --   --   --   --   --  3.1*  MG  --   --   --   --  2.2  --  1.8  INR 1.1  --   --   --   --   --   --   TSH  --   --   --   --   --  1.146  --   HGBA1C  --  11.0*  --   --   --   --   --   AMMONIA  --   --   --  44*  --   --   --   BNP  --   --   --   --   --   --  120.1*     ------------------------------------------------------------------------------------------------------------------ No results for input(s): CHOL, HDL, LDLCALC, TRIG, CHOLHDL, LDLDIRECT in the last 72 hours.  Lab Results  Component Value Date   HGBA1C 11.0 (H) 08/11/2021    Recent Labs    08/12/21 1500  TSH 1.146   ------------------------------------------------------------------------------------------------------------------ ID Labs Recent Labs  Lab 08/11/21 1300 08/11/21 1712 08/11/21 2351 08/12/21 0304 08/13/21 0211  WBC 12.8*  --   --   --  11.7*  PLT 155  --   --   --  151  CREATININE 2.30* 2.10* 1.85* 1.75* 1.41*   Cardiac Enzymes No results for input(s): CKMB, TROPONINI, MYOGLOBIN in the last 168 hours.  Invalid input(s): CK   Radiology Reports CT Abdomen Pelvis Wo Contrast  Result Date: 08/11/2021 CLINICAL DATA:  Acute nonlocalized abdominal pain in a 81 year old female. EXAM: CT ABDOMEN AND PELVIS WITHOUT CONTRAST TECHNIQUE: Multidetector CT imaging of the abdomen and pelvis was performed following the standard protocol without IV contrast. RADIATION DOSE REDUCTION: This exam was performed according to the departmental dose-optimization program which includes automated exposure control, adjustment of the  mA and/or kV according to patient size and/or use of iterative reconstruction technique. COMPARISON:  None FINDINGS: Lower chest: LEFT basilar airspace disease, patchy airspace disease at the LEFT lung base. Small amounts of material in the LEFT lower lobe airways. The three-vessel coronary artery disease. RIGHT lung is clear. Hepatobiliary: Unremarkable appearance of the liver with smooth contours. Post cholecystectomy without gross biliary duct distension. Pancreas: Pancreatic atrophy without signs of inflammation. Spleen: Normal size and contour. Adrenals/Urinary Tract: Adrenal glands are normal. No perivesical stranding. Mild perinephric stranding with signs of  cortical scarring. No hydronephrosis. Stomach/Bowel: Stomach is under distended limiting assessment. No signs of small bowel obstruction, some mildly distended loops are scattered throughout the abdomen. Appendix not visualized, no secondary signs to suggest acute appendicitis. Marked colonic distension involving the anti dependent: In the anterior abdomen, tortuous transverse colon. Transition point at the level of the splenic flexure without surrounding stranding or visible mass. No visible colonic twist. Colonic loops are of more normal caliber in the sigmoid becoming moderately distended however in the rectum with moderate to marked amount of formed stool focally in the rectum. No signs of perirectal stranding. Degree of distension of the transverse colon is up to 7 cm AP diameter and 8 cm transverse. Again the level of transition is abrupt but without clear cause. Vascular/Lymphatic: Aortic atherosclerosis without aneurysmal dilation. No signs of adenopathy in the abdomen or in the pelvis. Reproductive: Unremarkable post hysterectomy appearance of the pelvis. Other: No signs of pneumatosis. No free intraperitoneal air. No pericolonic stranding or wall thickening. Musculoskeletal: No acute bone finding. No destructive bone process. Spinal degenerative changes. Osteopenia. IMPRESSION: 1. Marked colonic distension involving the tortuous transverse colon. Transition point at the level of the splenic flexure without surrounding stranding or visible mass. Transition well abrupt is without colonic twist or adjacent stranding or thickening and at this time is favored to represent developing colonic ileus. Given the marked colonic dilation however there is a risk for colonic perforation. Close follow-up and GI consultation may be helpful. 2. Moderately large amount of stool in the rectum could also indicate fecal impaction. Sigmoid colon is however not substantially distended on the current evaluation. 3. Scattered  small bowel loops with mild distension as well further supporting the possibility of developing more generalized ileus. 4. LEFT basilar airspace disease, patchy airspace disease at the LEFT lung base, concerning for pneumonia. Small amounts of material in the LEFT lower lobe airways, raising the question of aspiration. 5. Post cholecystectomy without gross biliary duct distension. 6. Aortic atherosclerosis. Aortic Atherosclerosis (ICD10-I70.0). Electronically Signed   By: Zetta Bills M.D.   On: 08/11/2021 18:57   DG Chest Port 1 View  Result Date: 08/11/2021 CLINICAL DATA:  Patient became weak and unresponsive during a bath today. EXAM: PORTABLE CHEST 1 VIEW COMPARISON:  07/18/2014. FINDINGS: There cardiac silhouette is normal in size. No mediastinal or hilar masses. Clear lungs.  No convincing pleural effusion.  No pneumothorax. Skeletal structures are demineralized, but grossly intact. IMPRESSION: No active disease. Electronically Signed   By: Lajean Manes M.D.   On: 08/11/2021 13:32   DG Abd Portable 1V  Result Date: 08/13/2021 CLINICAL DATA:  Nausea. EXAM: PORTABLE ABDOMEN - 1 VIEW COMPARISON:  08/12/2021 FINDINGS: No significant change in the appearance of diffuse gaseous distension of the colon. Interval improvement in previously noted small bowel distension. There is a moderate amount of retained desiccated stool within the rectum. IMPRESSION: 1. Interval improvement in small bowel distension. 2. Persistent gaseous distension  of the colon. Moderate retained stool within the rectum may reflect underlying rectal impaction. Electronically Signed   By: Kerby Moors M.D.   On: 08/13/2021 07:11   DG Abd Portable 1V  Result Date: 08/12/2021 CLINICAL DATA:  Nausea EXAM: PORTABLE ABDOMEN - 1 VIEW COMPARISON:  CT 08/11/2021 FINDINGS: There is persistent dilation of the colon with increased distention of small-bowel loops. IMPRESSION: Persistent colonic dilation with increased distention of small-bowel  loops. Findings could represent adynamic ileus or obstruction. Electronically Signed   By: Maurine Simmering M.D.   On: 08/12/2021 12:50   EEG adult  Result Date: 08/12/2021 Robyne Peers, MD     08/12/2021 12:30 AM [facility] Routine EEG Report Date and Time of Study: 08/11/21 at 1627. Duration: 23 min Indication: Altered mentation. Technical Summary: A routine 20-channel electroencephalogram using the international 10-20 system of electrode placement was performed. Background: The best posterior dominant rhythm (PDR) identified was 7 Hz. The background consisted predominantly of symmetric theta slowing (6-7 Hz). No focal slowing was seen. The background was reactive to stimulation. States: Awake. Sleep was not seen during this EEG. Activation Procedures: Hyperventilation: Not performed Photic Stimulation: Not performed Classification: abnormal due to: 1. Symmetric theta slowing. Diagnosis: This is an abnormal EEG due to mild generalized slowing. Clinical Correlation: This is an abnormal EEG. The presence of mild generalized slowing is consistent with mild diffuse cortical dysfunction and mild non-specific encephalopathy. There were no epileptiform discharges seen. No seizures were seen. Allena Earing, MD CNP/Epilepsy [facility] [phone] Case: [id number]  CT HEAD CODE STROKE WO CONTRAST  Result Date: 08/11/2021 CLINICAL DATA:  Code stroke. Neuro deficit, acute, stroke suspected. EXAM: CT HEAD WITHOUT CONTRAST TECHNIQUE: Contiguous axial images were obtained from the base of the skull through the vertex without intravenous contrast. RADIATION DOSE REDUCTION: This exam was performed according to the departmental dose-optimization program which includes automated exposure control, adjustment of the mA and/or kV according to patient size and/or use of iterative reconstruction technique. COMPARISON:  Head MRI 08/08/2019 FINDINGS: Brain: There is a new small region of  low-density/edema in the left hippocampus. A chronic infarct is again noted posteriorly in the right basal ganglia. A lacunar infarct in the left corona radiata is new but also chronic in appearance. No intracranial hemorrhage, midline shift, or extra-axial fluid collection is identified. Mild cerebral atrophy is within normal limits for age. Hypodensities in the cerebral white matter bilaterally are nonspecific but compatible with mild chronic small vessel ischemic disease. Vascular: Calcified atherosclerosis at the skull base. No hyperdense vessel. Skull: No acute fracture or suspicious osseous lesion. Sinuses/Orbits: Visualized paranasal sinuses and mastoid air cells are clear. Unremarkable orbits. Other: None. IMPRESSION: 1. New small region of edema/possible infarct in the left hippocampus. 2. Chronic small vessel ischemia with old white matter infarcts. These results were communicated to Dr. Leonel Ramsay at 12:51 pm on 08/11/2021 by text page via the [facility] messaging system. Electronically Signed   By: Logan Bores M.D.   On: 08/11/2021 12:52

## 2021-08-13 NOTE — Progress Notes (Signed)
Palliative-   Thank you for this consult for goals of care.  Patient is a current patient of [facility]- contacted their liaison and they are able to assist with clarifying goals of care.  Palliative will sign off for now- please re consult if needed.   Mariana Kaufman, AGNP-C Palliative Medicine  No charge

## 2021-08-13 NOTE — Plan of Care (Signed)
°  Problem: Consults Goal: RH STROKE PATIENT EDUCATION Description: See Patient Education module for education specifics  Outcome: Progressing Goal: Nutrition Consult-if indicated Outcome: Progressing Goal: Diabetes Guidelines if Diabetic/Glucose > 140 Description: If diabetic or lab glucose is > 140 mg/dl - Initiate Diabetes/Hyperglycemia Guidelines & Document Interventions  Outcome: Progressing   Problem: Consults Goal: RH STROKE PATIENT EDUCATION Description: See Patient Education module for education specifics  Outcome: Progressing   Problem: Consults Goal: Nutrition Consult-if indicated Outcome: Progressing   Problem: Consults Goal: Diabetes Guidelines if Diabetic/Glucose > 140 Description: If diabetic or lab glucose is > 140 mg/dl - Initiate Diabetes/Hyperglycemia Guidelines & Document Interventions  Outcome: Progressing

## 2021-08-13 NOTE — Progress Notes (Signed)
Speech Language Pathology Treatment: Dysphagia  Patient Details Name: Julia Banks MRN: [id number] DOB: 1941-03-10 Today's Date: 08/13/2021 Time: 3614-4315 SLP Time Calculation (min) (ACUTE ONLY): 17 min  Assessment / Plan / Recommendation Clinical Impression  Pt is more alert this morning but continues to have dysarthria that she reports to be acute and s/s of dysphagia across consistencies tested. She has oral holding and reduced awareness, often blowing into a cup or straw - making bubbles instead of drinking. Regardless of consistency she has at least 2-3 audible swallows per bolus with associated grimacing and intermittent coughing. SLP attempted to call niece again but was not able to reach her. Pt only wanted very little to eat or drink before declining additional POs offered, but says she would like to be able to have at least a little bit to drink so her mouth won't be as dry. Would focus on oral care to keep pt's mouth as clean as possible and provide a little moisture. Pt might be appropriate for comfort feeds if family is interested, allowing her to have at least a few bites/sips at a time but with known risk. Discussed with MD who says to order liquids only for today. In addition to frequent oral care, would provide full supervision and careful assistance with feeding to try to reduce risk of aspiration as much as possible.    HPI HPI: Pt is a 81 yo female presenting with AMS after an episode of unresponsiveness. She is enrolled in hospice due to decline, falls. Work up underway; found to be in mild DKA. CTH showed a new small region of edema/possible infarct in the L hippocampus. CXR with no active disease, although CT abdomen revealed L basilar airspace disease concerning for PNA and small amounts of material in thr airway concerning for aspiration. Per MD H&P note, pt had a h/o dysphagia after stroke in 2020 and needed to use thickened liquids, but she had improved and no longer used  them PTA. However, family had noted coughing when pt tried to drink reclined at home. SLP eval from that time only for cognition (24/30 on MOCA but at her baseline). PMH also includes: CVA with residual L weakness, vascular dementia, GERD, PNA ("years ago"), SNHL, OSA, DMII, COVID, remote breast ca, CKD, HTN, HLD, hypothyroidism      SLP Plan  Continue with current plan of care      Recommendations for follow up therapy are one component of a multi-disciplinary discharge planning process, led by the attending physician.  Recommendations may be updated based on patient status, additional functional criteria and insurance authorization.    Recommendations  Diet recommendations: Thin liquids for comfort per MD Medication Administration: Crushed with puree                Oral Care Recommendations: Oral care QID Follow Up Recommendations: Other (comment) (none anticipated due to goal of comfort) Assistance recommended at discharge: Frequent or constant Supervision/Assistance SLP Visit Diagnosis: Dysphagia, unspecified (R13.10) Plan: Continue with current plan of care           Osie Bond., M.A. [facility] Pager [phone] Office [phone]  08/13/2021, 9:38 AM

## 2021-08-13 NOTE — Progress Notes (Incomplete)
R

## 2021-08-13 NOTE — Progress Notes (Signed)
Speech Language Pathology Treatment: Dysphagia  Patient Details Name: Julia Banks MRN: [id number] DOB: 04-Jun-1941 Today's Date: 08/13/2021 Time: 7591-6384 SLP Time Calculation (min) (ACUTE ONLY): 32 min  Assessment / Plan / Recommendation Clinical Impression  SLP returned to room to see pt with niece, [name], (and her daughter) present. [name] confirms changes noted in the pt over the last few days. She says that she is still demonstrating new changes on her L side, including her face/eye, AMS, dysarthria, and an increase in trouble swallowing. She noted intermittent coughing at home, primarily when Ms. Neto would forget to sit up to drink water. Although she had discussed the use of thickener with hospice nurse, they had decided to leave her on thin liquids since it was only happening at times.   Today, [name] acknowledges new changes in swallowing that include facial grimacing associated with swallowing as well as audible secretions, with a subjective increase in congestion throughout PO trials even though pt only coughed this afternoon once (and delayed). We discussed several options, including wishes for things like instrumental testing and/or diet modifications. She knows that she does not want a feeding tube, as per Ms. Fahringer's wishes, and that she also does not want to deprive her of POs by keeping her NPO. She thinks the current diet of full liquids would be things that the pt typically likes, and she has also not been consuming very much lately. She is open to testing if it seems like she is taking in more POs or if she is overtly struggling, but for now, we will maintain full liquid diet as comfort feeds. She acknowledges risks of aspiration, that would likely be present in the absence of POs as well given reduced secretion management. We reviewed several strategies to try to reduce risk as much as possible, also emphasizing the importance of oral care. SLP will continue to  follow.    HPI HPI: Pt is a 81 yo female presenting with AMS after an episode of unresponsiveness. She is enrolled in hospice due to decline, falls. Work up underway; found to be in mild DKA. CTH showed a new small region of edema/possible infarct in the L hippocampus. CXR with no active disease, although CT abdomen revealed L basilar airspace disease concerning for PNA and small amounts of material in thr airway concerning for aspiration. Per MD H&P note, pt had a h/o dysphagia after stroke in 2020 and needed to use thickened liquids, but she had improved and no longer used them PTA. However, family had noted coughing when pt tried to drink reclined at home. SLP eval from that time only for cognition (24/30 on MOCA but at her baseline). PMH also includes: CVA with residual L weakness, vascular dementia, GERD, PNA ("years ago"), SNHL, OSA, DMII, COVID, remote breast ca, CKD, HTN, HLD, hypothyroidism      SLP Plan  Continue with current plan of care      Recommendations for follow up therapy are one component of a multi-disciplinary discharge planning process, led by the attending physician.  Recommendations may be updated based on patient status, additional functional criteria and insurance authorization.    Recommendations  Diet recommendations: Thin liquid (full liquid diet) Liquids provided via: Teaspoon;Cup;Straw Medication Administration: Crushed with puree Supervision: Staff to assist with self feeding;Full supervision/cueing for compensatory strategies;Trained caregiver to feed patient Compensations: Minimize environmental distractions;Slow rate;Small sips/bites Postural Changes and/or Swallow Maneuvers: Seated upright 90 degrees;Upright 30-60 min after meal  Oral Care Recommendations: Oral care QID Follow Up Recommendations: Other (comment) (none anticipated due to goal of comfort) Assistance recommended at discharge: Frequent or constant Supervision/Assistance SLP  Visit Diagnosis: Dysphagia, unspecified (R13.10) Plan: Continue with current plan of care           Osie Bond., M.A. [facility] Pager [phone] Office [phone]  08/13/2021, 1:41 PM

## 2021-08-13 NOTE — Progress Notes (Signed)
[facility]) hospitalized hospice patient visit.   This is a current [facility] hospice patient with a terminal diagnosis of Cerebral Atherosclerosis.  Patient became unresponsive during her bath on 08/11/21 and family activated EMS. Patient was transported to ED and  admitted with diagnosis of pneumonia and stroke on 08/11/21. Per [facility] physician, Dr Orpah Melter this is a related hospital admission.    Visited at bedside with niece [name] present. Patient is more alert today but minimally interactive. Niece was doing her nails and fixing her hair. Niece reported patient was able to recognize her family today.   This patient is appropriate for inpatient due to need for IV fluids and antibiotics.   VS: 97.4, 159/68, 70, 17, 95% RA I/O: not recorded/500  Abnormal labs: 08/13/21 02:11 Sodium: 148 (H) Potassium: 3.1 (L) Chloride: 117 (H) Glucose: 142 (H) BUN: 27 (H) Creatinine: 1.41 (H) Albumin: 3.1 (L) Total Protein: 5.8 (L) GFR, Estimated: 38 (L) B Natriuretic Peptide: 120.1 (H) WBC: 11.7 (H) NEUT#: 8.6 (H)  Diagnostics: Abd xray IMPRESSION: 1. Interval improvement in small bowel distension. 2. Persistent gaseous distension of the colon. Moderate retained stool within the rectum may reflect underlying rectal impaction.    IV/PRN medications: Unasyn 3g IVPB BID  Problem list: Acute left hippocampus stroke along with severe dehydration, hypotension causing metabolic encephalopathy - of note patient is home hospice and appears terminal, goal of care here was comfort and nonaggressive measures according to the chart review.  She has been seen by neurology, no further work-up will be done as the outcome would not change.  Continue aspirin only, will add niacin, note patient has severe statin allergy.  EEG also noted which is nonacute.   2.  Previous history of stroke with left-sided hemiparesis, advanced vascular dementia.  She has been enrolled into home hospice since  middle of 2022 with goal being of comfort measures.  Have discussed her case with her hospice team, also had detailed discussion with patient's power of attorney her niece on 08/12/2021 continue gentle medical treatment avoid heroics and unnecessary testing, discharge with home hospice again.     3.  Severe dehydration, hypotension, hypokalemia and hypernatremia.  Placed on D5W, replace potassium and monitor.  Clinically improving.   4.  Ileus noted on CT scan on admission.  Mild pain no Nausea, abdominal exam soft, also suspicion of large stool burden in the rectum, better after bowel disimpaction and smog enema, 3 BMs since then, continue bowel regimen and monitor, she has no nausea and repeat imaging is much improved.   5.  Dyslipidemia.  Statin allergy placed on niacin.   6.  Hypothyroidism.  Continue home dose Synthroid.   7.  OSA.  Wears nighttime CPAP.   8.  ? Asp PNA on CT - SLP, Unasyn, monitor.   9. DM Type II with DKA upon admission.  DKA has resolved, since she is n.p.o. have placed her on half home dose Lantus along with every 4 hours sliding scale.  Monitor and adjust.  A1c suggests poor outpatient glycemic control.  Discharge planning: Return home with hospice when stable for discharge.    Family contact: Spoke with niece [name] at bedside.   IDG: team update   Goals of care:  DNR. Reviewed goals of care again today with niece. Treat infection but no aggressive therapies or surgeries.   Please used [facility] for ambulance transport as this services is contracted for all [facility] active hospice patients.   A Please do not  hesitate to call with questions.   Thank you,   Farrel Gordon, RN, [facility] Liaison   [phone]

## 2021-08-14 ENCOUNTER — Inpatient Hospital Stay (HOSPITAL_COMMUNITY)

## 2021-08-14 LAB — COMPREHENSIVE METABOLIC PANEL
ALT: 28 U/L (ref 0–44)
AST: 21 U/L (ref 15–41)
Albumin: 3.3 g/dL — ABNORMAL LOW (ref 3.5–5.0)
Alkaline Phosphatase: 70 U/L (ref 38–126)
Anion gap: 8 (ref 5–15)
BUN: 16 mg/dL (ref 8–23)
CO2: 20 mmol/L — ABNORMAL LOW (ref 22–32)
Calcium: 8.8 mg/dL — ABNORMAL LOW (ref 8.9–10.3)
Chloride: 116 mmol/L — ABNORMAL HIGH (ref 98–111)
Creatinine, Ser: 1.16 mg/dL — ABNORMAL HIGH (ref 0.44–1.00)
GFR, Estimated: 48 mL/min — ABNORMAL LOW (ref >60)
Glucose, Bld: 150 mg/dL — ABNORMAL HIGH (ref 70–99)
Potassium: 3.3 mmol/L — ABNORMAL LOW (ref 3.5–5.1)
Sodium: 144 mmol/L (ref 135–145)
Total Bilirubin: 0.8 mg/dL (ref 0.3–1.2)
Total Protein: 6.1 g/dL — ABNORMAL LOW (ref 6.5–8.1)

## 2021-08-14 LAB — GLUCOSE, CAPILLARY
Glucose-Capillary: 128 mg/dL — ABNORMAL HIGH (ref 70–99)
Glucose-Capillary: 63 mg/dL — ABNORMAL LOW (ref 70–99)
Glucose-Capillary: 57 mg/dL — ABNORMAL LOW (ref 70–99)
Glucose-Capillary: 180 mg/dL — ABNORMAL HIGH (ref 70–99)
Glucose-Capillary: 215 mg/dL — ABNORMAL HIGH (ref 70–99)
Glucose-Capillary: 182 mg/dL — ABNORMAL HIGH (ref 70–99)
Glucose-Capillary: 146 mg/dL — ABNORMAL HIGH (ref 70–99)

## 2021-08-14 LAB — CBC WITH DIFFERENTIAL/PLATELET
Abs Immature Granulocytes: 0.05 10*3/uL (ref 0.00–0.07)
Basophils Absolute: 0 10*3/uL (ref 0.0–0.1)
Basophils Relative: 0 %
Eosinophils Absolute: 0.2 10*3/uL (ref 0.0–0.5)
Eosinophils Relative: 2 %
HCT: 40.9 % (ref 36.0–46.0)
Hemoglobin: 13.2 g/dL (ref 12.0–15.0)
Immature Granulocytes: 1 %
Lymphocytes Relative: 15 %
Lymphs Abs: 1.5 10*3/uL (ref 0.7–4.0)
MCH: 27.7 pg (ref 26.0–34.0)
MCHC: 32.3 g/dL (ref 30.0–36.0)
MCV: 85.9 fL (ref 80.0–100.0)
Monocytes Absolute: 0.9 10*3/uL (ref 0.1–1.0)
Monocytes Relative: 9 %
Neutro Abs: 7.5 10*3/uL (ref 1.7–7.7)
Neutrophils Relative %: 73 %
Platelets: 117 10*3/uL — ABNORMAL LOW (ref 150–400)
RBC: 4.76 MIL/uL (ref 3.87–5.11)
RDW: 13.9 % (ref 11.5–15.5)
WBC: 10.2 10*3/uL (ref 4.0–10.5)
nRBC: 0 % (ref 0.0–0.2)

## 2021-08-14 LAB — BRAIN NATRIURETIC PEPTIDE: B Natriuretic Peptide: 289.3 pg/mL — ABNORMAL HIGH (ref 0.0–100.0)

## 2021-08-14 LAB — MAGNESIUM: Magnesium: 1.7 mg/dL (ref 1.7–2.4)

## 2021-08-14 MED ORDER — INSULIN ASPART 100 UNIT/ML IJ SOLN
0.0000 [IU] | Freq: Three times a day (TID) | INTRAMUSCULAR | Status: DC
  Administered 2021-08-14: 2 [IU] via SUBCUTANEOUS
  Administered 2021-08-14 – 2021-08-15 (×2): 3 [IU] via SUBCUTANEOUS

## 2021-08-14 MED ORDER — DOCUSATE SODIUM 100 MG PO CAPS: 200.0000 mg | ORAL_CAPSULE | Freq: Two times a day (BID) | ORAL | Status: DC

## 2021-08-14 MED ORDER — POTASSIUM CHLORIDE 2 MEQ/ML IV SOLN
INTRAVENOUS | Status: AC
  Filled 2021-08-14: qty 1000

## 2021-08-14 MED ORDER — INSULIN GLARGINE-YFGN 100 UNIT/ML ~~LOC~~ SOLN
20.0000 [IU] | Freq: Every day | SUBCUTANEOUS | Status: DC
Start: 2021-08-14 — End: 2021-08-14

## 2021-08-14 MED ORDER — ENOXAPARIN SODIUM 40 MG/0.4ML IJ SOSY
40.0000 mg | PREFILLED_SYRINGE | INTRAMUSCULAR | Status: DC
  Administered 2021-08-14: 40 mg via SUBCUTANEOUS
  Filled 2021-08-14: qty 0.4

## 2021-08-14 MED ORDER — BISACODYL 10 MG RE SUPP
10.0000 mg | Freq: Two times a day (BID) | RECTAL | Status: DC
  Administered 2021-08-14 – 2021-08-15 (×2): 10 mg via RECTAL
  Filled 2021-08-14: qty 1

## 2021-08-14 MED ORDER — SODIUM CHLORIDE (PF) 0.9 % IJ SOLN
INTRAMUSCULAR | Status: AC
  Administered 2021-08-14: 10 mL
  Filled 2021-08-14: qty 10

## 2021-08-14 MED ORDER — DEXTROSE 50 % IV SOLN
INTRAVENOUS | Status: AC
  Administered 2021-08-14: 50 mL
  Filled 2021-08-14: qty 50

## 2021-08-14 MED ORDER — POLYETHYLENE GLYCOL 3350 17 G PO PACK
17.0000 g | PACK | Freq: Two times a day (BID) | ORAL | Status: DC
  Administered 2021-08-14 (×2): 17 g via ORAL
  Filled 2021-08-14 (×2): qty 1

## 2021-08-14 NOTE — Progress Notes (Signed)
During rounding, nursing assessed 1-2+ edema to RUE, IVF were infusing thru R wrist PIV, after reviewing pt's chart R AC PIV and R hand PIV had infiltrated a few days ago and were dc'd. Nursing unable to verify if R Wrist PIV was patent and edema was from earlier infiltrates so IVF stopped and IV removed. Pt has #22 to L hand, patent and infusing at this time, pt denies pain or discomfort. Will continue to monitor and assess

## 2021-08-14 NOTE — Care Management Important Message (Signed)
Important Message  Patient Details  Name: Julia Banks MRN: [id number] Date of Birth: 1940-08-14   Medicare Important Message Given:  Yes     [name] Montine Circle 08/14/2021, 1:42 PM

## 2021-08-14 NOTE — Progress Notes (Signed)
[employer] [facility]) Hospitalized Hospice Patient Note   This is a current [facility] hospice patient with a terminal diagnosis of Cerebral Atherosclerosis.  Patient became unresponsive during her bath on 08/11/21 and family activated EMS. Patient was transported to ED and  admitted with diagnosis of pneumonia and stroke on 08/11/21. Per [facility] physician, Dr Orpah Melter this is a related hospital admission.   Visited at bedside and exchanged report with unit RN Denyse Amass. Patient is very lethargic today and does not arouse to verbal stimuli. Per RN she has been more lethargic today versus yesterday. He states that she has been alert and oriented times 1 today.   Patient continues to remain inpatient appropriate for IV antibiotics and IV fluids.   V/S: 98.1, 63, 68, 16, 139/55, 92%  I/O: none documented today  Abnormal Labs 2.15.23 Glucose-Capillary 70 - 99 mg/dL 180 (H) 08/14/21 11:55  Potassium 3.5 - 5.1 mmol/L 3.3 (L) 08/14/21 01:22  Chloride 98 - 111 mmol/L 116 (H) 08/14/21 01:22  CO2 22 - 32 mmol/L 20 (L) 08/14/21 01:22  Glucose 70 - 99 mg/dL 150 (H) 08/14/21 01:22  Creatinine 0.44 - 1.00 mg/dL 1.16 (H) 08/14/21 01:22  Calcium 8.9 - 10.3 mg/dL 8.8 (L) 08/14/21 01:22  Albumin 3.5 - 5.0 g/dL 3.3 (L) 08/14/21 01:22  Total Protein 6.5 - 8.1 g/dL 6.1 (L) 08/14/21 01:22  GFR, Estimated >60 mL/min 48 (L) 08/14/21 01:22  B Natriuretic Peptide 0.0 - 100.0 pg/mL 289.3 (H) 08/14/21 01:22  Platelets 150 - 400 K/uL 117 (L) 08/14/21 01:22  Diagnostics Abdomen X Ray FINDINGS: There is persistent abnormal gaseous distension of the large bowel loops as noted on previous imaging. Compared with the previous exam there is been increase caliber of previously characterized redundant and tortuous distal transverse colon which now measures 10.7 cm versus 8.7 cm on the previous exam. Retained stool is again noted in the rectum.   IMPRESSION: 1. Persistent abnormal gaseous distension of the large bowel  loops. Etiology remains indeterminate with differential considerations including distal large bowel obstruction versus colonic ileus. 2. Increase in caliber of previously characterized redundant and tortuous distal transverse colon.  IV/PRN Medications: IV protonix 40 mg, IV unasyn 3 g, IV haldol 5 mg   Problem:  Acute left hippocampus stroke along with severe dehydration, hypotension causing metabolic encephalopathy - of note patient is home hospice and appears terminal, goal of care here was comfort and nonaggressive measures according to the chart review.  She has been seen by neurology, no further work-up will be done as the outcome would not change.  Continue aspirin only, will add niacin, note patient has severe statin allergy.  EEG also noted which is nonacute.   2.  Previous history of stroke with left-sided hemiparesis, advanced vascular dementia.  She has been enrolled into home hospice since middle of 2022 with goal being of comfort measures.  Have discussed her case with her hospice team, also had detailed discussion with patient's power of attorney her niece on 08/12/2021 continue gentle medical treatment avoid heroics and unnecessary testing, discharge with home hospice again.     3.  Severe dehydration, hypotension, hypokalemia and hypernatremia.  Placed on D5W, replace potassium and monitor.  Clinically improving.   4.  Ileus noted on CT scan on admission.  Mild pain no Nausea, abdominal exam soft, also suspicion of large stool burden in the rectum, better after bowel disimpaction and SMOG enema, 3 BMs since then, have improved she has no abdominal discomfort, bowel regimen adjusted further  will repeat KUB on 08/14/2021.   5.  Dyslipidemia.  Statin allergy placed on niacin.   6.  Hypothyroidism.  Continue home dose Synthroid.   7.  OSA.  Wears nighttime CPAP.   8.  ? Asp PNA on CT - SLP, Unasyn, monitor.  Clinically much improved.   9.  Right arm infiltration of IV.  Elevate  the right arm.     10. DM Type II with DKA upon admission.  DKA has resolved, insulin dose adjusted further on 08/14/2021 she is running hypoglycemic on much lower than home dose insulin, question compliance at home with insulin.  A1c suggests poor outpatient glycemic control.  Discharge planning: return home with hospice when medically ready   Family Contact: spoke with niece [name]   IDT: team updated   Goals of care: DNR. Reviewed goals of care again today with niece. Treat infection but no aggressive therapies or surgeries.    Please used [facility] for ambulance transport as this services is contracted for all [facility] active hospice patients.  Please call with questions or concerns. Thank you  Roselee Nova, [facility] Liaison  [phone]

## 2021-08-14 NOTE — TOC Initial Note (Signed)
Transition of Care [facility]) - Initial/Assessment Note    Patient Details  Name: Julia Banks MRN: [id number] Date of Birth: 08/01/1940  Transition of Care [facility]) CM/SW Contact:    Carles Collet, RN Phone Number: 08/14/2021, 3:07 PM  Clinical Narrative:   Julia Banks w patient's niece Julia Banks. She confirms demographics. Patient is from home w her and family who provide 24/7 care. They would like for her to return home and continue home hospice services at discharge.  Notified Shanita w [facility] that patient may be ready for DC as early as tomorrow.  Julia Banks confirms that they have all needed DME at home.   Patient will need transport set up w [facility] (as an active [facility] patient) Medical necessity forms initiated. Please ensure DNR on chart    Laverna Peace (Niece)  [phone]                Expected Discharge Plan: Home w Hospice Care Barriers to Discharge: Continued Medical Work up   Patient Goals and CMS Choice Patient states their goals for this hospitalization and ongoing recovery are:: return home w hospice per neice CMS Medicare.gov Compare Post Acute Care list provided to:: Other (Comment Required) Choice offered to / list presented to : [facility] POA / Guardian  Expected Discharge Plan and Services Expected Discharge Plan: [place]   Discharge Planning Services: CM Consult Post Acute Care Choice: Resumption of Svcs/PTA Provider Living arrangements for the past 2 months: [place]: Hospice and [place] Date [place]: 08/14/21 Time HH Agency Contacted: 42 Representative spoke with at [place]: Julia Banks  Prior Living Arrangements/Services Living arrangements for the past 2 months: [place] Lives with:: Relatives              Current home services: DME    Activities of Daily Living Home Assistive Devices/Equipment: None ADL Screening (condition at  time of admission) Patient's cognitive ability adequate to safely complete daily activities?: No Is the patient deaf or have difficulty hearing?: Yes Does the patient have difficulty seeing, even when wearing glasses/contacts?: No Does the patient have difficulty concentrating, remembering, or making decisions?: Yes Patient able to express need for assistance with ADLs?: Yes Does the patient have difficulty dressing or bathing?: Yes Independently performs ADLs?: No Communication: Needs assistance Is this a change from baseline?: Pre-admission baseline Dressing (OT): Needs assistance Is this a change from baseline?: Pre-admission baseline Grooming: Needs assistance Is this a change from baseline?: Pre-admission baseline Feeding: Needs assistance Is this a change from baseline?: Pre-admission baseline Bathing: Needs assistance Is this a change from baseline?: Pre-admission baseline Toileting: Needs assistance Is this a change from baseline?: Pre-admission baseline In/Out Bed: Needs assistance Is this a change from baseline?: Pre-admission baseline Walks in Home: Needs assistance Is this a change from baseline?: Pre-admission baseline Does the patient have difficulty walking or climbing stairs?: Yes Weakness of Legs: Left Weakness of Arms/Hands: Left  Permission Sought/Granted                  Emotional Assessment              Admission diagnosis:  Hyperglycemia [R73.9] AKI (acute kidney injury) ([place]) [N17.9] Altered mental status, unspecified altered mental status type [R41.82] Hyperglycemic crisis due to diabetes mellitus ([place]) [E11.65] Patient  Active Problem List   Diagnosis Date Noted   Pressure injury of skin 08/12/2021   Hyperglycemic crisis due to diabetes mellitus ([place]) 16/38/4536   Acute metabolic encephalopathy 46/80/3212   Vascular dementia ([place]) 01/18/2021   Statin intolerance 10/13/2020   Vitamin D deficiency 10/13/2020   Urine incontinence 10/10/2020    MDD (major depressive disorder), recurrent episode, mild ([place]) 05/30/2019   History of cerebrovascular accident (CVA) due to ischemia 03/09/2019   Hemiparesis affecting left side as late effect of cerebrovascular accident (CVA) ([place]) 03/09/2019   Advanced care planning/counseling discussion 01/27/2016   Sensorineural hearing loss (SNHL) of both ears 01/27/2016   Insomnia 02/13/2015   Chronic cough 10/10/2014   Osteopenia 07/31/2014   S/P mastectomy 06/07/2014   Genetic testing 06/02/2014   Breast cancer of upper-outer quadrant of left female breast ([place]) 03/28/2014   Grieving 10/25/2013   Medicare annual wellness visit, subsequent 06/28/2013   Diabetes mellitus with stage 3 chronic kidney disease ([place])    Hypothyroidism 04/13/2013   GERD (gastroesophageal reflux disease)    Essential hypertension, benign 09/17/2012   Hyperlipidemia associated with type 2 diabetes mellitus ([place]) 09/17/2012   Controlled diabetes mellitus type 2 with complications ([place]) 24/82/5003   OSA on CPAP 02/13/2012   Coronary artery disease 02/13/2012   PCP:  Ria Bush, MD Pharmacy:   [facility] [place], [place] [address] [place] [address] Phone: [phone] Fax: [phone]     Social Determinants of Health (SDOH) Interventions    Readmission Risk Interventions No flowsheet data found.

## 2021-08-14 NOTE — Progress Notes (Addendum)
PROGRESS NOTE                                                                                                                                                                                                             Patient Demographics:    Julia Banks, is a 81 y.o. female, DOB - 24-Dec-1940, ASN:[id number]  Outpatient Primary MD for the patient is Ria Bush, MD    LOS - 3  Admit date - 08/11/2021    Chief Complaint  Patient presents with   Code Stroke       Brief Narrative (HPI from H&P)  Julia Banks is a 81 y.o. female with medical history significant of remote breast cancer; CVA with residual L-sided deficits; vascular dementia, on hospice family not desiring any aggressive measures; stage 3 CKD; HTN; HLD; hypothyroidism; OSA on CPAP; and DM presenting with AMS. This AM, she got up about 8 and ate without difficulty.    Apparently on the day of admission her health aide was at her house and giving her a bath when the patient slumped lost consciousness was also found to have low blood pressure with high blood sugars and she was transferred to [facility] for further care.  Of note patient is being followed by hospice of .   Subjective:   Patient in bed denies any headache chest pain or shortness of breath, overall feels better, no abdominal pain, 1 bowel movement last night.   Assessment  & Plan :    Acute left hippocampus stroke along with severe dehydration, hypotension causing metabolic encephalopathy - of note patient is home hospice and appears terminal, goal of care here was comfort and nonaggressive measures according to the chart review.  She has been seen by neurology, no further work-up will be done as the outcome would not change.  Continue aspirin only, will add niacin, note patient has severe statin allergy.  EEG also noted which is nonacute.  2.  Previous history of stroke  with left-sided hemiparesis, advanced vascular dementia.  She has been enrolled into home hospice since middle of 2022 with goal being of comfort measures.  Have discussed her case with her hospice team, also had detailed discussion with patient's power of attorney her niece on 08/12/2021 continue gentle medical treatment avoid heroics and unnecessary testing, discharge with home hospice  again.   3.  Severe dehydration, hypotension, hypokalemia and hypernatremia.  Placed on D5W, replace potassium and monitor.  Clinically improving.  4.  Ileus noted on CT scan on admission.  Mild pain no Nausea, abdominal exam soft, also suspicion of large stool burden in the rectum, better after bowel disimpaction and SMOG enema, 3 BMs since then, have improved she has no abdominal discomfort, bowel regimen adjusted further will repeat KUB on 08/14/2021.  5.  Dyslipidemia.  Statin allergy placed on niacin.  6.  Hypothyroidism.  Continue home dose Synthroid.  7.  OSA.  Wears nighttime CPAP.  8.  ? Asp PNA on CT - SLP, Unasyn, monitor.  Clinically much improved.  9.  Right arm infiltration of IV.  Elevate the right arm.    10. DM Type II with DKA upon admission.  DKA has resolved, insulin dose adjusted further on 08/14/2021 she is running hypoglycemic on much lower than home dose insulin, question compliance at home with insulin.  A1c suggests poor outpatient glycemic control.     Lab Results  Component Value Date   HGBA1C 11.0 (H) 08/11/2021   CBG (last 3)  Recent Labs    08/14/21 0508 08/14/21 0757 08/14/21 0932  GLUCAP 128* 63* 57*         Condition - Extremely Guarded  Family Communication  :   Called Niece [phone] on 08/12/21 - left message 11 am, had detailed discussion later in the day on 08/12/2021, DNR, continue medical treatment eventual discharge with hospice at home.  Called Sister [phone] on 08/12/21 - left message 11:05 am  Code Status :  DNR  Consults  :  Pall Care - ?  GOC  PUD Prophylaxis :  PPI   Procedures  :     EEG - This is an abnormal EEG. The presence of mild generalized slowing is consistent with mild diffuse cortical dysfunction and mild non-specific encephalopathy. There were no epileptiform discharges seen. No seizures were seen.  CT Head - 1. New small region of edema/possible infarct in the left hippocampus. 2. Chronic small vessel ischemia with old white matter infarcts.  CT ABD - 1. Marked colonic distension involving the tortuous transverse colon. Transition point at the level of the splenic flexure without surrounding stranding or visible mass. Transition well abrupt is without colonic twist or adjacent stranding or thickening and at this time is favored to represent developing colonic ileus. Given the marked colonic dilation however there is a risk for colonic perforation. Close follow-up and GI consultation may be helpful. 2. Moderately large amount of stool in the rectum could also indicate fecal impaction. Sigmoid colon is however not substantially distended on the current evaluation. 3. Scattered small bowel loops with mild distension as well further supporting the possibility of developing more generalized ileus. 4. LEFT basilar airspace disease, patchy airspace disease at the LEFT lung base, concerning for pneumonia. Small amounts of material in the LEFT lower lobe airways, raising the question of aspiration. 5. Post cholecystectomy without gross biliary duct distension. 6. Aortic atherosclerosis. Aortic Atherosclerosis (ICD10-I70.0).       Disposition Plan  :    Status is: Inpatient   DVT Prophylaxis  :    enoxaparin (LOVENOX) injection 30 mg Start: 08/11/21 1800    Lab Results  Component Value Date   PLT 117 (L) 08/14/2021    Diet :  Diet Order             Diet full liquid Room service appropriate?  Yes; Fluid consistency: Thin  Diet effective now                    Inpatient Medications  Scheduled Meds:    stroke: mapping our early stages of recovery book   Does not apply Once   aspirin  300 mg Rectal Daily   bisacodyl  10 mg Rectal BID   dextrose       docusate sodium  200 mg Oral BID   enoxaparin (LOVENOX) injection  30 mg Subcutaneous Q24H   insulin aspart  0-9 Units Subcutaneous TID WC   insulin glargine-yfgn  20 Units Subcutaneous Daily   lactulose  30 g Oral TID   levothyroxine  50 mcg Oral Q0600   niacin  100 mg Oral BID WC   pantoprazole (PROTONIX) IV  40 mg Intravenous QHS   polyethylene glycol  17 g Oral BID   Continuous Infusions:  ampicillin-sulbactam (UNASYN) IV 3 g (08/14/21 0631)   lactated ringers with kcl 100 mL/hr at 08/14/21 0708   PRN Meds:.haloperidol lactate, hydrALAZINE, ondansetron (ZOFRAN) IV  Antibiotics  :    Anti-infectives (From admission, onward)    Start     Dose/Rate Route Frequency Ordered Stop   08/13/21 1530  Ampicillin-Sulbactam (UNASYN) 3 g in sodium chloride 0.9 % 100 mL IVPB        3 g 200 mL/hr over 30 Minutes Intravenous Every 8 hours 08/13/21 1450     08/12/21 1530  Ampicillin-Sulbactam (UNASYN) 3 g in sodium chloride 0.9 % 100 mL IVPB  Status:  Discontinued        3 g 200 mL/hr over 30 Minutes Intravenous Every 12 hours 08/12/21 1440 08/13/21 1450        Time Spent in minutes  30   Lala Lund M.D on 08/14/2021 at 9:37 AM  To page go to [email or web address]   [facility] -  Office  [phone]  See all Orders from today for further details    Objective:   Vitals:   08/13/21 2000 08/14/21 0000 08/14/21 0400 08/14/21 0755  BP: 139/70 (!) 159/66 (!) 157/108 (!) 149/55  Pulse: 77 69 77 63  Resp: 20 20 16 18   Temp:  99.9 F (37.7 C) 99 F (37.2 C) 98.8 F (37.1 C)  TempSrc:  Axillary Axillary Axillary  SpO2: 92% 93% 95% 94%  Weight:      Height:        Wt Readings from Last 3 Encounters:  08/11/21 74.2 kg  11/09/20 85.3 kg  09/26/19 85.3 kg     Intake/Output Summary (Last 24 hours) at 08/14/2021 0937 Last  data filed at 08/14/2021 0452 Gross per 24 hour  Intake 1403.76 ml  Output 550 ml  Net 853.76 ml     Physical Exam  Awake Alert x1, chronic left arm contracture, mild subcutaneous edema in the right arm around the site of previous IV [place].AT,PERRAL Supple Neck, No JVD,   Symmetrical Chest wall movement, Good air movement bilaterally, CTAB RRR,No Gallops, Rubs or new Murmurs,  +ve B.Sounds, Abd Soft, No tenderness,   No Cyanosis, Clubbing or edema       RN pressure injury documentation: Pressure Injury 08/11/21 Buttocks Mid Stage 2 -  Partial thickness loss of dermis presenting as a shallow open injury with a red, pink wound bed without slough. (Active)  08/11/21 2025  Location: Buttocks  Location Orientation: Mid  Staging: Stage 2 -  Partial thickness loss of dermis presenting as a shallow open  injury with a red, pink wound bed without slough.  Wound Description (Comments):   Present on Admission: Yes     Pressure Injury 08/11/21 Heel Left Stage 2 -  Partial thickness loss of dermis presenting as a shallow open injury with a red, pink wound bed without slough. (Active)  08/11/21 2025  Location: Heel  Location Orientation: Left  Staging: Stage 2 -  Partial thickness loss of dermis presenting as a shallow open injury with a red, pink wound bed without slough.  Wound Description (Comments):   Present on Admission: Yes     Data Review:    CBC Recent Labs  Lab 08/11/21 1237 08/11/21 1300 08/11/21 1719 08/13/21 0211 08/14/21 0122  WBC  --  12.8*  --  11.7* 10.2  HGB 15.6* 14.7 14.3 13.5 13.2  HCT 46.0 48.7* 42.0 41.4 40.9  PLT  --  155  --  151 117*  MCV  --  91.9  --  86.4 85.9  MCH  --  27.7  --  28.2 27.7  MCHC  --  30.2  --  32.6 32.3  RDW  --  14.1  --  13.9 13.9  LYMPHSABS  --  1.3  --  2.0 1.5  MONOABS  --  0.8  --  0.8 0.9  EOSABS  --  0.0  --  0.2 0.2  BASOSABS  --  0.0  --  0.0 0.0    Electrolytes Recent Labs  Lab 08/11/21 1300 08/11/21 1712  08/11/21 1719 08/11/21 2351 08/12/21 0304 08/12/21 1500 08/13/21 0211 08/14/21 0122  NA 145 148* 149* 153* 155*  --  148* 144  K 4.5 4.0 4.3 3.3* 3.8  --  3.1* 3.3*  CL 111 115*  --  121* 126*  --  117* 116*  CO2 17* 18*  --  20* 18*  --  23 20*  GLUCOSE 982* 781*  --  242* 180*  --  142* 150*  BUN 63* 59*  --  53* 51*  --  27* 16  CREATININE 2.30* 2.10*  --  1.85* 1.75*  --  1.41* 1.16*  CALCIUM 9.7 9.3  --  9.8 9.7  --  8.9 8.8*  AST 45*  --   --   --   --   --  39 21  ALT 38  --   --   --   --   --  35 28  ALKPHOS 97  --   --   --   --   --  70 70  BILITOT 0.8  --   --   --   --   --  0.8 0.8  ALBUMIN 4.1  --   --   --   --   --  3.1* 3.3*  MG  --   --   --   --  2.2  --  1.8 1.7  INR 1.1  --   --   --   --   --   --   --   TSH  --   --   --   --   --  1.146  --   --   HGBA1C  --  11.0*  --   --   --   --   --   --   AMMONIA  --   --   --  44*  --   --   --   --   BNP  --   --   --   --   --   --  120.1* 289.3*    ------------------------------------------------------------------------------------------------------------------ No results for input(s): CHOL, HDL, LDLCALC, TRIG, CHOLHDL, LDLDIRECT in the last 72 hours.  Lab Results  Component Value Date   HGBA1C 11.0 (H) 08/11/2021    Recent Labs    08/12/21 1500  TSH 1.146   ------------------------------------------------------------------------------------------------------------------ ID Labs Recent Labs  Lab 08/11/21 1300 08/11/21 1712 08/11/21 2351 08/12/21 0304 08/13/21 0211 08/14/21 0122  WBC 12.8*  --   --   --  11.7* 10.2  PLT 155  --   --   --  151 117*  CREATININE 2.30* 2.10* 1.85* 1.75* 1.41* 1.16*   Cardiac Enzymes No results for input(s): CKMB, TROPONINI, MYOGLOBIN in the last 168 hours.  Invalid input(s): CK   Radiology Reports CT Abdomen Pelvis Wo Contrast  Result Date: 08/11/2021 CLINICAL DATA:  Acute nonlocalized abdominal pain in a 81 year old female. EXAM: CT ABDOMEN AND PELVIS  WITHOUT CONTRAST TECHNIQUE: Multidetector CT imaging of the abdomen and pelvis was performed following the standard protocol without IV contrast. RADIATION DOSE REDUCTION: This exam was performed according to the departmental dose-optimization program which includes automated exposure control, adjustment of the mA and/or kV according to patient size and/or use of iterative reconstruction technique. COMPARISON:  None FINDINGS: Lower chest: LEFT basilar airspace disease, patchy airspace disease at the LEFT lung base. Small amounts of material in the LEFT lower lobe airways. The three-vessel coronary artery disease. RIGHT lung is clear. Hepatobiliary: Unremarkable appearance of the liver with smooth contours. Post cholecystectomy without gross biliary duct distension. Pancreas: Pancreatic atrophy without signs of inflammation. Spleen: Normal size and contour. Adrenals/Urinary Tract: Adrenal glands are normal. No perivesical stranding. Mild perinephric stranding with signs of cortical scarring. No hydronephrosis. Stomach/Bowel: Stomach is under distended limiting assessment. No signs of small bowel obstruction, some mildly distended loops are scattered throughout the abdomen. Appendix not visualized, no secondary signs to suggest acute appendicitis. Marked colonic distension involving the anti dependent: In the anterior abdomen, tortuous transverse colon. Transition point at the level of the splenic flexure without surrounding stranding or visible mass. No visible colonic twist. Colonic loops are of more normal caliber in the sigmoid becoming moderately distended however in the rectum with moderate to marked amount of formed stool focally in the rectum. No signs of perirectal stranding. Degree of distension of the transverse colon is up to 7 cm AP diameter and 8 cm transverse. Again the level of transition is abrupt but without clear cause. Vascular/Lymphatic: Aortic atherosclerosis without aneurysmal dilation. No signs  of adenopathy in the abdomen or in the pelvis. Reproductive: Unremarkable post hysterectomy appearance of the pelvis. Other: No signs of pneumatosis. No free intraperitoneal air. No pericolonic stranding or wall thickening. Musculoskeletal: No acute bone finding. No destructive bone process. Spinal degenerative changes. Osteopenia. IMPRESSION: 1. Marked colonic distension involving the tortuous transverse colon. Transition point at the level of the splenic flexure without surrounding stranding or visible mass. Transition well abrupt is without colonic twist or adjacent stranding or thickening and at this time is favored to represent developing colonic ileus. Given the marked colonic dilation however there is a risk for colonic perforation. Close follow-up and GI consultation may be helpful. 2. Moderately large amount of stool in the rectum could also indicate fecal impaction. Sigmoid colon is however not substantially distended on the current evaluation. 3. Scattered small bowel loops with mild distension as well further supporting the possibility of developing more generalized ileus. 4. LEFT basilar airspace disease, patchy airspace disease at the LEFT lung base,  concerning for pneumonia. Small amounts of material in the LEFT lower lobe airways, raising the question of aspiration. 5. Post cholecystectomy without gross biliary duct distension. 6. Aortic atherosclerosis. Aortic Atherosclerosis (ICD10-I70.0). Electronically Signed   By: Zetta Bills M.D.   On: 08/11/2021 18:57   DG Chest Port 1 View  Result Date: 08/11/2021 CLINICAL DATA:  Patient became weak and unresponsive during a bath today. EXAM: PORTABLE CHEST 1 VIEW COMPARISON:  07/18/2014. FINDINGS: There cardiac silhouette is normal in size. No mediastinal or hilar masses. Clear lungs.  No convincing pleural effusion.  No pneumothorax. Skeletal structures are demineralized, but grossly intact. IMPRESSION: No active disease. Electronically Signed   By:  Lajean Manes M.D.   On: 08/11/2021 13:32   DG Abd Portable 1V  Result Date: 08/13/2021 CLINICAL DATA:  Nausea. EXAM: PORTABLE ABDOMEN - 1 VIEW COMPARISON:  08/12/2021 FINDINGS: No significant change in the appearance of diffuse gaseous distension of the colon. Interval improvement in previously noted small bowel distension. There is a moderate amount of retained desiccated stool within the rectum. IMPRESSION: 1. Interval improvement in small bowel distension. 2. Persistent gaseous distension of the colon. Moderate retained stool within the rectum may reflect underlying rectal impaction. Electronically Signed   By: Kerby Moors M.D.   On: 08/13/2021 07:11   DG Abd Portable 1V  Result Date: 08/12/2021 CLINICAL DATA:  Nausea EXAM: PORTABLE ABDOMEN - 1 VIEW COMPARISON:  CT 08/11/2021 FINDINGS: There is persistent dilation of the colon with increased distention of small-bowel loops. IMPRESSION: Persistent colonic dilation with increased distention of small-bowel loops. Findings could represent adynamic ileus or obstruction. Electronically Signed   By: Maurine Simmering M.D.   On: 08/12/2021 12:50   EEG adult  Result Date: 08/12/2021 Robyne Peers, MD     08/12/2021 12:30 AM [facility] Routine EEG Report Date and Time of Study: 08/11/21 at 1627. Duration: 23 min Indication: Altered mentation. Technical Summary: A routine 20-channel electroencephalogram using the international 10-20 system of electrode placement was performed. Background: The best posterior dominant rhythm (PDR) identified was 7 Hz. The background consisted predominantly of symmetric theta slowing (6-7 Hz). No focal slowing was seen. The background was reactive to stimulation. States: Awake. Sleep was not seen during this EEG. Activation Procedures: Hyperventilation: Not performed Photic Stimulation: Not performed Classification: abnormal due to: 1. Symmetric theta slowing. Diagnosis: This is an abnormal EEG  due to mild generalized slowing. Clinical Correlation: This is an abnormal EEG. The presence of mild generalized slowing is consistent with mild diffuse cortical dysfunction and mild non-specific encephalopathy. There were no epileptiform discharges seen. No seizures were seen. Allena Earing, MD CNP/Epilepsy [facility] [phone] Case: [id number]  CT HEAD CODE STROKE WO CONTRAST  Result Date: 08/11/2021 CLINICAL DATA:  Code stroke. Neuro deficit, acute, stroke suspected. EXAM: CT HEAD WITHOUT CONTRAST TECHNIQUE: Contiguous axial images were obtained from the base of the skull through the vertex without intravenous contrast. RADIATION DOSE REDUCTION: This exam was performed according to the departmental dose-optimization program which includes automated exposure control, adjustment of the mA and/or kV according to patient size and/or use of iterative reconstruction technique. COMPARISON:  Head MRI 08/08/2019 FINDINGS: Brain: There is a new small region of low-density/edema in the left hippocampus. A chronic infarct is again noted posteriorly in the right basal ganglia. A lacunar infarct in the left corona radiata is new but also chronic in appearance. No intracranial hemorrhage, midline shift, or extra-axial fluid collection is identified. Mild cerebral atrophy is  within normal limits for age. Hypodensities in the cerebral white matter bilaterally are nonspecific but compatible with mild chronic small vessel ischemic disease. Vascular: Calcified atherosclerosis at the skull base. No hyperdense vessel. Skull: No acute fracture or suspicious osseous lesion. Sinuses/Orbits: Visualized paranasal sinuses and mastoid air cells are clear. Unremarkable orbits. Other: None. IMPRESSION: 1. New small region of edema/possible infarct in the left hippocampus. 2. Chronic small vessel ischemia with old white matter infarcts. These results were communicated to Dr. Leonel Ramsay at 12:51 pm on 08/11/2021 by text page via  the [facility] messaging system. Electronically Signed   By: Logan Bores M.D.   On: 08/11/2021 12:52

## 2021-08-14 NOTE — Progress Notes (Signed)
Speech Language Pathology Treatment: Dysphagia  Patient Details Name: Julia Banks MRN: [id number] DOB: Jun 17, 1941 Today's Date: 08/14/2021 Time: 6270-3500 SLP Time Calculation (min) (ACUTE ONLY): 20 min  Assessment / Plan / Recommendation Clinical Impression  SLP attempted to see patient in morning but after oral care, she was too lethargic to safely take PO's. SLP then returned to patient's room in PM and her niece [name] was in the room. As patient remained asleep, focus of session was on education. [name] mentioned a different SLP talking about a measured delivery cup. SLP retrieved 10cc Provale cup and demonstrated to [name] how to use. SLP also discussed option of using drink thickeners at times and plan to show these to niece and patient next visit. RN who was in room for portion of session and niece both reported that patient was significantly more sleepy today as compared to yesterday. SLP will continue to follow patient for dysphagia goals.    HPI HPI: Pt is a 81 yo female presenting with AMS after an episode of unresponsiveness. She is enrolled in hospice due to decline, falls. Work up underway; found to be in mild DKA. CTH showed a new small region of edema/possible infarct in the L hippocampus. CXR with no active disease, although CT abdomen revealed L basilar airspace disease concerning for PNA and small amounts of material in thr airway concerning for aspiration. Per MD H&P note, pt had a h/o dysphagia after stroke in 2020 and needed to use thickened liquids, but she had improved and no longer used them PTA. However, family had noted coughing when pt tried to drink reclined at home. SLP eval from that time only for cognition (24/30 on MOCA but at her baseline). PMH also includes: CVA with residual L weakness, vascular dementia, GERD, PNA ("years ago"), SNHL, OSA, DMII, COVID, remote breast ca, CKD, HTN, HLD, hypothyroidism      SLP Plan  Continue with current plan of care       Recommendations for follow up therapy are one component of a multi-disciplinary discharge planning process, led by the attending physician.  Recommendations may be updated based on patient status, additional functional criteria and insurance authorization.    Recommendations  Diet recommendations: Thin liquid Liquids provided via: Teaspoon;Cup;Straw Medication Administration: Crushed with puree Supervision: Staff to assist with self feeding;Full supervision/cueing for compensatory strategies;Trained caregiver to feed patient Compensations: Minimize environmental distractions;Slow rate;Small sips/bites                Oral Care Recommendations: Oral care QID;Staff/trained caregiver to provide oral care Follow Up Recommendations: Other (comment) Assistance recommended at discharge: Frequent or constant Supervision/Assistance SLP Visit Diagnosis: Dysphagia, unspecified (R13.10) Plan: Continue with current plan of care           Sonia Baller, MA, CCC-SLP Speech Therapy

## 2021-08-15 ENCOUNTER — Other Ambulatory Visit (HOSPITAL_COMMUNITY): Payer: Self-pay

## 2021-08-15 LAB — MAGNESIUM: Magnesium: 1.7 mg/dL (ref 1.7–2.4)

## 2021-08-15 LAB — CBC WITH DIFFERENTIAL/PLATELET
Abs Immature Granulocytes: 0.09 10*3/uL — ABNORMAL HIGH (ref 0.00–0.07)
Basophils Absolute: 0 10*3/uL (ref 0.0–0.1)
Basophils Relative: 0 %
Eosinophils Absolute: 0.5 10*3/uL (ref 0.0–0.5)
Eosinophils Relative: 5 %
HCT: 40.1 % (ref 36.0–46.0)
Hemoglobin: 13.4 g/dL (ref 12.0–15.0)
Immature Granulocytes: 1 %
Lymphocytes Relative: 18 %
Lymphs Abs: 1.7 10*3/uL (ref 0.7–4.0)
MCH: 28.8 pg (ref 26.0–34.0)
MCHC: 33.4 g/dL (ref 30.0–36.0)
MCV: 86.2 fL (ref 80.0–100.0)
Monocytes Absolute: 0.8 10*3/uL (ref 0.1–1.0)
Monocytes Relative: 9 %
Neutro Abs: 6.3 10*3/uL (ref 1.7–7.7)
Neutrophils Relative %: 67 %
Platelets: 99 10*3/uL — ABNORMAL LOW (ref 150–400)
RBC: 4.65 MIL/uL (ref 3.87–5.11)
RDW: 13.7 % (ref 11.5–15.5)
WBC: 9.4 10*3/uL (ref 4.0–10.5)
nRBC: 0 % (ref 0.0–0.2)

## 2021-08-15 LAB — COMPREHENSIVE METABOLIC PANEL
ALT: 20 U/L (ref 0–44)
AST: 17 U/L (ref 15–41)
Albumin: 2.9 g/dL — ABNORMAL LOW (ref 3.5–5.0)
Alkaline Phosphatase: 62 U/L (ref 38–126)
Anion gap: 11 (ref 5–15)
BUN: 11 mg/dL (ref 8–23)
CO2: 18 mmol/L — ABNORMAL LOW (ref 22–32)
Calcium: 8.6 mg/dL — ABNORMAL LOW (ref 8.9–10.3)
Chloride: 114 mmol/L — ABNORMAL HIGH (ref 98–111)
Creatinine, Ser: 1.12 mg/dL — ABNORMAL HIGH (ref 0.44–1.00)
GFR, Estimated: 50 mL/min — ABNORMAL LOW (ref >60)
Glucose, Bld: 164 mg/dL — ABNORMAL HIGH (ref 70–99)
Potassium: 4.2 mmol/L (ref 3.5–5.1)
Sodium: 143 mmol/L (ref 135–145)
Total Bilirubin: 0.8 mg/dL (ref 0.3–1.2)
Total Protein: 5.5 g/dL — ABNORMAL LOW (ref 6.5–8.1)

## 2021-08-15 LAB — GLUCOSE, CAPILLARY
Glucose-Capillary: 167 mg/dL — ABNORMAL HIGH (ref 70–99)
Glucose-Capillary: 210 mg/dL — ABNORMAL HIGH (ref 70–99)
Glucose-Capillary: 206 mg/dL — ABNORMAL HIGH (ref 70–99)

## 2021-08-15 LAB — BRAIN NATRIURETIC PEPTIDE: B Natriuretic Peptide: 82.4 pg/mL (ref 0.0–100.0)

## 2021-08-15 MED ORDER — LACTATED RINGERS IV SOLN: INTRAVENOUS | Status: DC

## 2021-08-15 MED ORDER — INSULIN GLARGINE 100 UNIT/ML ~~LOC~~ SOLN: 20.0000 [IU] | Freq: Every day | SUBCUTANEOUS | 0 refills | Status: DC

## 2021-08-15 MED ORDER — AMOXICILLIN-POT CLAVULANATE 500-125 MG PO TABS
1.0000 | ORAL_TABLET | Freq: Three times a day (TID) | ORAL | 0 refills | Status: AC
  Filled 2021-08-15: qty 6, 2d supply, fill #0

## 2021-08-15 MED ORDER — CARVEDILOL 6.25 MG PO TABS: 6.2500 mg | ORAL_TABLET | Freq: Two times a day (BID) | ORAL | Status: DC

## 2021-08-15 MED ORDER — NIACIN 100 MG PO TABS
100.0000 mg | ORAL_TABLET | Freq: Two times a day (BID) | ORAL | 0 refills | Status: AC
Start: 2021-08-15 — End: ?
  Filled 2021-08-15: qty 60, 30d supply, fill #0

## 2021-08-15 MED ORDER — LACTULOSE ENCEPHALOPATHY 10 GM/15ML PO SOLN
30.0000 g | Freq: Two times a day (BID) | ORAL | 0 refills | Status: AC | PRN
  Filled 2021-08-15: qty 236, 3d supply, fill #0

## 2021-08-15 MED ORDER — DOCUSATE SODIUM 100 MG PO CAPS
200.0000 mg | ORAL_CAPSULE | Freq: Every day | ORAL | 0 refills | Status: AC
  Filled 2021-08-15: qty 20, 10d supply, fill #0

## 2021-08-15 MED ORDER — INSULIN GLARGINE 100 UNIT/ML ~~LOC~~ SOLN: 15.0000 [IU] | Freq: Every day | SUBCUTANEOUS | 0 refills | Status: AC

## 2021-08-15 MED ORDER — POLYETHYLENE GLYCOL 3350 17 GM/SCOOP PO POWD
17.0000 g | Freq: Every day | ORAL | 0 refills | Status: AC
  Filled 2021-08-15: qty 238, 14d supply, fill #0

## 2021-08-15 MED ORDER — INSULIN LISPRO (1 UNIT DIAL) 100 UNIT/ML (KWIKPEN)
PEN_INJECTOR | SUBCUTANEOUS | 0 refills | Status: AC
  Filled 2021-08-15: qty 6, 25d supply, fill #0

## 2021-08-15 NOTE — Progress Notes (Signed)
Pt's head was raised upright and a sip of water was given before medication.  Pt appeared to have significant difficulty swallowing it and began to cough.  MD was notified. Advised to leave pt NPO overnight until SLP could reassess.   Pt was awake and alert at the time.  Pt remained upright for 45 minutes post coughing.   Suction was setup in the room and an oral hygiene kit was used for oral care.

## 2021-08-15 NOTE — Progress Notes (Signed)
SLP Cancellation Note  Patient Details Name: Julia Banks MRN: [id number] DOB: 1940-08-19   Cancelled treatment:       Reason Eval/Treat Not Completed: Patient's level of consciousness;Other (comment) (Patient asleep and unarousable. She is planned discharge home with hospice services today. SLP spoke with niece [name] on phone and left sample box of drink thickeners with patient's belongings. SLP to s/o at this time)  Sonia Baller, MA, CCC-SLP Speech Therapy

## 2021-08-15 NOTE — Progress Notes (Signed)
Patient IV removed. Patient educated on any medication regimen addition or change. Patient educated on any follow up appointments. Patient's belongings gathered by transport at discharge. Per Speech, patient sent home with oral supplements. No complaints or concerns stated at this time. Family at bedside informed of discharge orders.

## 2021-08-15 NOTE — Discharge Summary (Addendum)
Julia Banks ZOX:[id number] DOB: Oct 24, 1940 DOA: 08/11/2021  PCP: Ria Bush, MD  Admit date: 08/11/2021  Discharge date: 08/15/2021  Admitted From: Home   Disposition:  Home   Recommendations for Outpatient Follow-up:   Follow up with PCP in 1-2 weeks  PCP Please obtain BMP/CBC, 2 view CXR in 1week,  (see Discharge instructions)   PCP Please follow up on the following pending results:    Home Health: Hospice   Equipment/Devices: None  Consultations: Neuro Discharge Condition: Guarded   CODE STATUS: DNR   Diet Recommendation: Soft diet with comfort feeds soft diet for comfort feeds with aspiration precautions.  High risk for aspiration.    Chief Complaint  Patient presents with   Code Stroke     Brief history of present illness from the day of admission and additional interim summary     Julia Banks is a 81 y.o. female with medical history significant of remote breast cancer; CVA with residual L-sided deficits; vascular dementia, on hospice family not desiring any aggressive measures; stage 3 CKD; HTN; HLD; hypothyroidism; OSA on CPAP; and DM presenting with AMS. This AM, she got up about 8 and ate without difficulty.     Apparently on the day of admission her health aide was at her house and giving her a bath when the patient slumped lost consciousness was also found to have low blood pressure with high blood sugars and she was transferred to [facility] for further care.  Of note patient is being followed by hospice of [place].                                                                 Hospital Course   Acute left hippocampus stroke along with severe dehydration, hypotension causing metabolic encephalopathy - of note patient is home hospice and appears terminal, goal of care  here was comfort and nonaggressive measures according to the chart review.  She has been seen by neurology, no further work-up will be done as the outcome would not change.  Continue aspirin only, will add niacin, note patient has severe statin allergy.  EEG also noted which is nonacute.   2.  Previous history of stroke with left-sided hemiparesis, advanced vascular dementia.  She has been enrolled into home hospice since middle of 2022 with goal being of comfort measures.  Have discussed her case with her hospice team, also had detailed discussion with patient's power of attorney her niece on 08/12/2021 continue gentle medical treatment avoid heroics and unnecessary testing, discharge with home hospice again.     3.  Severe dehydration, hypotension, hypokalemia and hypernatremia.  was Placed on D5W, replaced potassium, dis continue diuretic and ACE/ARB upon discharge.   4.  Ileus noted on CT scan on admission.  Also with large  stool burden on imaging, underwent disimpaction, SMO G enema, bowel regimen with multiple BMs now, placed on bowel regimen upon discharge request hospice team to monitor her bowel activity very closely.   5.  Dyslipidemia.  Statin allergy placed on niacin.   6.  Hypothyroidism.  Continue home dose Synthroid.   7.  OSA.  Wears nighttime CPAP.   8.  ? Asp PNA on CT -placed on Unasyn clinically resolved 3 more days of oral Augmentin, seen by speech currently on soft diet for comfort feeds high risk for aspiration.  Goal of care is comfort.   9.  Right arm infiltration of IV.  Elevate the right arm.  Much improved with supportive care.   10. DM Type II with DKA upon admission.  DKA has resolved, question compliance at home with insulin.  A1c suggests poor outpatient glycemic control.  Dose adjusted upon discharge and sliding scale added for further better control.  Requested to check CBGs q. ACH S.  Lab Results  Component Value Date   HGBA1C 11.0 (H) 08/11/2021     Discharge diagnosis     Principal Problem:   Acute metabolic encephalopathy Active Problems:   OSA on CPAP   Essential hypertension, benign   Hyperlipidemia associated with type 2 diabetes mellitus ([place])   Hypothyroidism   Advanced care planning/counseling discussion   Hemiparesis affecting left side as late effect of cerebrovascular accident (CVA) ([place])   Vascular dementia ([place])   Hyperglycemic crisis due to diabetes mellitus ([place])   Pressure injury of skin    Discharge instructions    Discharge Instructions     Discharge instructions   Complete by: As directed    Disposition.  Residential hospice Condition.  Guarded CODE STATUS.  DNR Activity.  With assistance as tolerated, full fall precautions. Diet.  Soft with feeding assistance and aspiration precautions. Goal of care.  Comfort.  Accuchecks 4 times/day, Once in AM empty stomach and then before each meal. Log in all results and show them to your Prim.MD in 3 days. If any glucose reading is under 80 or above 300 call your Prim MD immidiately. Follow Low glucose instructions for glucose under 80 as instructed.   Discharge wound care:   Complete by: As directed    Buttocks Mid Stage 2 -  Partial thickness loss of dermis presenting as a shallow open injury with a red, pink wound bed without slough.  Pressure Injury Heel Left Stage 2 -  Partial thickness loss of dermis presenting as a shallow open injury with a red, pink wound bed without slough.   Increase activity slowly   Complete by: As directed        Discharge Medications   Allergies as of 08/15/2021       Reactions   Namenda [memantine] Other (See Comments)   migraine   Repatha [evolocumab] Other (See Comments)   migraine   Statins Other (See Comments)   myalgias   Zetia [ezetimibe] Other (See Comments)   Joint pain, memory changes   Altace [ramipril] Other (See Comments)   Upset stomach   Aspirin Nausea And Vomiting   Ok with 81mg      Metformin And Related Other (See Comments)   GI upset        Medication List     STOP taking these medications    hydrochlorothiazide 12.5 MG capsule Commonly known as: MICROZIDE   losartan 100 MG tablet Commonly known as: COZAAR       TAKE these medications  acetaminophen 325 MG tablet Commonly known as: TYLENOL Take 2 tablets (650 mg total) by mouth 3 (three) times daily as needed for mild pain (or temp > 37.5 C (99.5 F)).   Advair Diskus 250-50 MCG/ACT Aepb Generic drug: fluticasone-salmeterol TAKE 1 PUFF BY MOUTH TWICE A DAY What changed: See the new instructions.   albuterol 108 (90 Base) MCG/ACT inhaler Commonly known as: VENTOLIN HFA TAKE 2 PUFFS BY MOUTH EVERY 6 HOURS AS NEEDED FOR WHEEZE OR SHORTNESS OF BREATH What changed: See the new instructions.   amLODipine 5 MG tablet Commonly known as: NORVASC Take 5 mg by mouth daily.   amoxicillin-clavulanate 500-125 MG tablet Commonly known as: Augmentin Take 1 tablet (500 mg total) by mouth 3 (three) times daily.   aspirin 81 MG EC tablet Take 1 tablet (81 mg total) by mouth daily.   baclofen 10 MG tablet Commonly known as: LIORESAL Take 1 tablet (10 mg total) by mouth at bedtime. What changed:  when to take this reasons to take this   BD Pen Needle Nano U/F 32G X 4 MM Misc Generic drug: Insulin Pen Needle USE 1 AS DIRECTED TO INJECT MEDICATION DAILY.   carvedilol 6.25 MG tablet Commonly known as: COREG TAKE 1 TABLET BY MOUTH 2 (TWO) TIMES DAILY. PLEASE SCHEDULE OFFICE VISIT FOR FURTHER REFILLS. THANK YOU! What changed: See the new instructions.   docusate sodium 100 MG capsule Commonly known as: COLACE Take 2 capsules (200 mg total) by mouth daily.   famotidine 20 MG tablet Commonly known as: PEPCID TAKE 1 TABLET BY MOUTH EVERYDAY AT BEDTIME What changed: See the new instructions.   insulin glargine 100 UNIT/ML injection Commonly known as: Lantus Inject 0.15 mLs (15 Units total) into the  skin daily.   insulin lispro 100 UNIT/ML KwikPen Commonly known as: HumaLOG KwikPen Before each meal 3 times a day, 140-199 - 2 units, 200-250 - 4 units, 251-299 - 6 units,  300-349 - 8 units,  350 or above 10 units.   Insulin Syringe-Needle U-100 31G X 15/64" 1 ML Misc Use as instructed to administer insulin daily.   lactulose (encephalopathy) 10 GM/15ML Soln Commonly known as: CHRONULAC Take 45 mLs (30 g total) by mouth 2 (two) times daily as needed for mild constipation.   levothyroxine 50 MCG tablet Commonly known as: SYNTHROID Take 1 tablet (50 mcg total) by mouth daily before breakfast.   melatonin 3 MG Tabs tablet Take 1 tablet (3 mg total) by mouth at bedtime. What changed:  when to take this reasons to take this   niacin 100 MG tablet Take 1 tablet (100 mg total) by mouth 2 (two) times daily with a meal.   oxybutynin 5 MG 24 hr tablet Commonly known as: DITROPAN-XL Take 5 mg by mouth at bedtime.   polyethylene glycol powder 17 GM/SCOOP powder Commonly known as: GLYCOLAX/MIRALAX Place 1 capful (17 g) in water and drink daily.   QUEtiapine 50 MG tablet Commonly known as: SEROQUEL Take 1 tablet (50 mg total) by mouth in the morning AND 1.5 tablets (75 mg total) at bedtime. What changed: See the new instructions.   sertraline 100 MG tablet Commonly known as: ZOLOFT Take 1 tablet (100 mg total) by mouth at bedtime.   Vitamin D3 1.25 MG (50000 UT) Tabs Take 1 tablet by mouth once a week.               Discharge Care Instructions  (From admission, onward)  Start     Ordered   08/15/21 0000  Discharge wound care:       Comments: Buttocks Mid Stage 2 -  Partial thickness loss of dermis presenting as a shallow open injury with a red, pink wound bed without slough.  Pressure Injury Heel Left Stage 2 -  Partial thickness loss of dermis presenting as a shallow open injury with a red, pink wound bed without slough.   08/15/21 1157               Major procedures and Radiology Reports - PLEASE review detailed and final reports thoroughly  -       CT Abdomen Pelvis Wo Contrast  Result Date: 08/11/2021 CLINICAL DATA:  Acute nonlocalized abdominal pain in a 81 year old female. EXAM: CT ABDOMEN AND PELVIS WITHOUT CONTRAST TECHNIQUE: Multidetector CT imaging of the abdomen and pelvis was performed following the standard protocol without IV contrast. RADIATION DOSE REDUCTION: This exam was performed according to the departmental dose-optimization program which includes automated exposure control, adjustment of the mA and/or kV according to patient size and/or use of iterative reconstruction technique. COMPARISON:  None FINDINGS: Lower chest: LEFT basilar airspace disease, patchy airspace disease at the LEFT lung base. Small amounts of material in the LEFT lower lobe airways. The three-vessel coronary artery disease. RIGHT lung is clear. Hepatobiliary: Unremarkable appearance of the liver with smooth contours. Post cholecystectomy without gross biliary duct distension. Pancreas: Pancreatic atrophy without signs of inflammation. Spleen: Normal size and contour. Adrenals/Urinary Tract: Adrenal glands are normal. No perivesical stranding. Mild perinephric stranding with signs of cortical scarring. No hydronephrosis. Stomach/Bowel: Stomach is under distended limiting assessment. No signs of small bowel obstruction, some mildly distended loops are scattered throughout the abdomen. Appendix not visualized, no secondary signs to suggest acute appendicitis. Marked colonic distension involving the anti dependent: In the anterior abdomen, tortuous transverse colon. Transition point at the level of the splenic flexure without surrounding stranding or visible mass. No visible colonic twist. Colonic loops are of more normal caliber in the sigmoid becoming moderately distended however in the rectum with moderate to marked amount of formed stool focally in the  rectum. No signs of perirectal stranding. Degree of distension of the transverse colon is up to 7 cm AP diameter and 8 cm transverse. Again the level of transition is abrupt but without clear cause. Vascular/Lymphatic: Aortic atherosclerosis without aneurysmal dilation. No signs of adenopathy in the abdomen or in the pelvis. Reproductive: Unremarkable post hysterectomy appearance of the pelvis. Other: No signs of pneumatosis. No free intraperitoneal air. No pericolonic stranding or wall thickening. Musculoskeletal: No acute bone finding. No destructive bone process. Spinal degenerative changes. Osteopenia. IMPRESSION: 1. Marked colonic distension involving the tortuous transverse colon. Transition point at the level of the splenic flexure without surrounding stranding or visible mass. Transition well abrupt is without colonic twist or adjacent stranding or thickening and at this time is favored to represent developing colonic ileus. Given the marked colonic dilation however there is a risk for colonic perforation. Close follow-up and GI consultation may be helpful. 2. Moderately large amount of stool in the rectum could also indicate fecal impaction. Sigmoid colon is however not substantially distended on the current evaluation. 3. Scattered small bowel loops with mild distension as well further supporting the possibility of developing more generalized ileus. 4. LEFT basilar airspace disease, patchy airspace disease at the LEFT lung base, concerning for pneumonia. Small amounts of material in the LEFT lower lobe airways, raising the question  of aspiration. 5. Post cholecystectomy without gross biliary duct distension. 6. Aortic atherosclerosis. Aortic Atherosclerosis (ICD10-I70.0). Electronically Signed   By: Zetta Bills M.D.   On: 08/11/2021 18:57   DG Chest Port 1 View  Result Date: 08/11/2021 CLINICAL DATA:  Patient became weak and unresponsive during a bath today. EXAM: PORTABLE CHEST 1 VIEW COMPARISON:   07/18/2014. FINDINGS: There cardiac silhouette is normal in size. No mediastinal or hilar masses. Clear lungs.  No convincing pleural effusion.  No pneumothorax. Skeletal structures are demineralized, but grossly intact. IMPRESSION: No active disease. Electronically Signed   By: Lajean Manes M.D.   On: 08/11/2021 13:32   DG Abd Portable 1V  Result Date: 08/14/2021 CLINICAL DATA:  Constipation. EXAM: PORTABLE ABDOMEN - 1 VIEW COMPARISON:  08/13/2021. FINDINGS: There is persistent abnormal gaseous distension of the large bowel loops as noted on previous imaging. Compared with the previous exam there is been increase caliber of previously characterized redundant and tortuous distal transverse colon which now measures 10.7 cm versus 8.7 cm on the previous exam. Retained stool is again noted in the rectum. IMPRESSION: 1. Persistent abnormal gaseous distension of the large bowel loops. Etiology remains indeterminate with differential considerations including distal large bowel obstruction versus colonic ileus. 2. Increase in caliber of previously characterized redundant and tortuous distal transverse colon. Electronically Signed   By: Kerby Moors M.D.   On: 08/14/2021 10:26   DG Abd Portable 1V  Result Date: 08/13/2021 CLINICAL DATA:  Nausea. EXAM: PORTABLE ABDOMEN - 1 VIEW COMPARISON:  08/12/2021 FINDINGS: No significant change in the appearance of diffuse gaseous distension of the colon. Interval improvement in previously noted small bowel distension. There is a moderate amount of retained desiccated stool within the rectum. IMPRESSION: 1. Interval improvement in small bowel distension. 2. Persistent gaseous distension of the colon. Moderate retained stool within the rectum may reflect underlying rectal impaction. Electronically Signed   By: Kerby Moors M.D.   On: 08/13/2021 07:11   DG Abd Portable 1V  Result Date: 08/12/2021 CLINICAL DATA:  Nausea EXAM: PORTABLE ABDOMEN - 1 VIEW COMPARISON:  CT  08/11/2021 FINDINGS: There is persistent dilation of the colon with increased distention of small-bowel loops. IMPRESSION: Persistent colonic dilation with increased distention of small-bowel loops. Findings could represent adynamic ileus or obstruction. Electronically Signed   By: Maurine Simmering M.D.   On: 08/12/2021 12:50   EEG adult  Result Date: 08/12/2021 Robyne Peers, MD     08/12/2021 12:30 AM [facility] Routine EEG Report Date and Time of Study: 08/11/21 at 1627. Duration: 23 min Indication: Altered mentation. Technical Summary: A routine 20-channel electroencephalogram using the international 10-20 system of electrode placement was performed. Background: The best posterior dominant rhythm (PDR) identified was 7 Hz. The background consisted predominantly of symmetric theta slowing (6-7 Hz). No focal slowing was seen. The background was reactive to stimulation. States: Awake. Sleep was not seen during this EEG. Activation Procedures: Hyperventilation: Not performed Photic Stimulation: Not performed Classification: abnormal due to: 1. Symmetric theta slowing. Diagnosis: This is an abnormal EEG due to mild generalized slowing. Clinical Correlation: This is an abnormal EEG. The presence of mild generalized slowing is consistent with mild diffuse cortical dysfunction and mild non-specific encephalopathy. There were no epileptiform discharges seen. No seizures were seen. Allena Earing, MD CNP/Epilepsy [facility] [phone] Case: [id number]  CT HEAD CODE STROKE WO CONTRAST  Result Date: 08/11/2021 CLINICAL DATA:  Code stroke. Neuro deficit, acute, stroke suspected. EXAM: CT HEAD  WITHOUT CONTRAST TECHNIQUE: Contiguous axial images were obtained from the base of the skull through the vertex without intravenous contrast. RADIATION DOSE REDUCTION: This exam was performed according to the departmental dose-optimization program which includes automated exposure  control, adjustment of the mA and/or kV according to patient size and/or use of iterative reconstruction technique. COMPARISON:  Head MRI 08/08/2019 FINDINGS: Brain: There is a new small region of low-density/edema in the left hippocampus. A chronic infarct is again noted posteriorly in the right basal ganglia. A lacunar infarct in the left corona radiata is new but also chronic in appearance. No intracranial hemorrhage, midline shift, or extra-axial fluid collection is identified. Mild cerebral atrophy is within normal limits for age. Hypodensities in the cerebral white matter bilaterally are nonspecific but compatible with mild chronic small vessel ischemic disease. Vascular: Calcified atherosclerosis at the skull base. No hyperdense vessel. Skull: No acute fracture or suspicious osseous lesion. Sinuses/Orbits: Visualized paranasal sinuses and mastoid air cells are clear. Unremarkable orbits. Other: None. IMPRESSION: 1. New small region of edema/possible infarct in the left hippocampus. 2. Chronic small vessel ischemia with old white matter infarcts. These results were communicated to Dr. Leonel Ramsay at 12:51 pm on 08/11/2021 by text page via the [facility] messaging system. Electronically Signed   By: Logan Bores M.D.   On: 08/11/2021 12:52     Today   Subjective    Julia Banks today has no headache,no chest abdominal pain,no new weakness tingling or numbness, feels much better     Objective   Blood pressure (!) 180/67, pulse 60, temperature 97.7 F (36.5 C), temperature source Axillary, resp. rate 20, height 5\' 6"  (1.676 m), weight 74.2 kg, SpO2 95 %.   Intake/Output Summary (Last 24 hours) at 08/15/2021 1456 Last data filed at 08/15/2021 1044 Gross per 24 hour  Intake --  Output 1100 ml  Net -1100 ml    Exam  Awake Alertx 1, L arm contracture  [place].AT,PERRAL Supple Neck,   Symmetrical Chest wall movement, Good air movement bilaterally, CTAB RRR,No Gallops,   +ve B.Sounds, Abd Soft, Non  tender,  No Cyanosis, Clubbing or edema    Data Review   CBC w Diff:  Lab Results  Component Value Date   WBC 9.4 08/15/2021   HGB 13.4 08/15/2021   HGB 12.0 07/27/2019   HGB 13.0 04/05/2014   HCT 40.1 08/15/2021   HCT 34.8 07/27/2019   HCT 39.0 04/05/2014   PLT 99 (L) 08/15/2021   PLT 198 07/27/2019   LYMPHOPCT 18 08/15/2021   LYMPHOPCT 28.9 04/05/2014   MONOPCT 9 08/15/2021   MONOPCT 6.7 04/05/2014   EOSPCT 5 08/15/2021   EOSPCT 4.1 04/05/2014   BASOPCT 0 08/15/2021   BASOPCT 0.7 04/05/2014    CMP:  Lab Results  Component Value Date   NA 143 08/15/2021   NA 145 (H) 07/27/2019   NA 140 04/02/2015   K 4.2 08/15/2021   K 4.3 04/02/2015   CL 114 (H) 08/15/2021   CO2 18 (L) 08/15/2021   CO2 27 04/02/2015   BUN 11 08/15/2021   BUN 34 (H) 07/27/2019   BUN 15.3 04/02/2015   CREATININE 1.12 (H) 08/15/2021   CREATININE 1.3 (H) 04/02/2015   PROT 5.5 (L) 08/15/2021   PROT 6.4 07/27/2019   PROT 6.6 04/02/2015   ALBUMIN 2.9 (L) 08/15/2021   ALBUMIN 3.9 07/27/2019   ALBUMIN 4.0 04/02/2015   BILITOT 0.8 08/15/2021   BILITOT 0.3 07/27/2019   BILITOT 0.67 04/02/2015   ALKPHOS 62  08/15/2021   ALKPHOS 85 04/02/2015   AST 17 08/15/2021   AST 20 04/02/2015   ALT 20 08/15/2021   ALT 17 04/02/2015  .   Total Time in preparing paper work, data evaluation and todays exam - 31 minutes  Lala Lund M.D on 08/15/2021 at 2:56 PM  [facility]

## 2021-08-15 NOTE — TOC Transition Note (Signed)
Transition of Care [facility]) - CM/SW Discharge Note   Patient Details  Name: Julia Banks MRN: [id number] Date of Birth: 1940-08-06  Transition of Care [facility]) CM/SW Contact:  Verdell Carmine, RN Phone Number: 08/15/2021, 2:28 PM   Clinical Narrative:    [facility] called for transport home. Discharge order written. Called Niece Ms [name] to verify that patient can come home. , no barriers. Nursing made aware of transport.   Final next level of care: Home w Hospice Care Barriers to Discharge: Continued Medical Work up   Patient Goals and CMS Choice Patient states their goals for this hospitalization and ongoing recovery are:: return home w hospice per neice CMS Medicare.gov Compare Post Acute Care list provided to:: Other (Comment Required) Choice offered to / list presented to : [place] / [place]  Discharge Placement                       Discharge Plan and Services   Discharge Planning Services: CM Consult Post Acute Care Choice: Resumption of Svcs/PTA Provider                      [facility] Agency: Hospice and [place] Date [place]: 08/14/21 Time [place]: [id number] Representative spoke with at [place]: [place] Determinants of Health ([place]) Interventions     Readmission Risk Interventions No flowsheet data found.

## 2021-08-15 NOTE — Plan of Care (Signed)
°  Problem: Education: Goal: Knowledge of General Education information will improve Description: Including pain rating scale, medication(s)/side effects and non-pharmacologic comfort measures Outcome: Progressing   Problem: Health Behavior/Discharge Planning: Goal: Ability to manage health-related needs will improve Outcome: Progressing   Problem: Clinical Measurements: Goal: Ability to maintain clinical measurements within normal limits will improve Outcome: Progressing Goal: Will remain free from infection Outcome: Progressing Goal: Diagnostic test results will improve Outcome: Progressing Goal: Respiratory complications will improve Outcome: Progressing Goal: Cardiovascular complication will be avoided Outcome: Progressing   Problem: Activity: Goal: Risk for activity intolerance will decrease Outcome: Progressing   Problem: Pain Managment: Goal: General experience of comfort will improve Outcome: Progressing   Problem: Safety: Goal: Ability to remain free from injury will improve Outcome: Progressing   Problem: Skin Integrity: Goal: Risk for impaired skin integrity will decrease Outcome: Progressing   Problem: Consults Goal: RH STROKE PATIENT EDUCATION Description: See Patient Education module for education specifics  Outcome: Progressing Goal: Nutrition Consult-if indicated Outcome: Progressing Goal: Diabetes Guidelines if Diabetic/Glucose > 140 Description: If diabetic or lab glucose is > 140 mg/dl - Initiate Diabetes/Hyperglycemia Guidelines & Document Interventions  Outcome: Progressing

## 2021-08-15 NOTE — Progress Notes (Signed)
Manufacturing engineer [facility]) Hospitalized Hospice Patient Note    This is a current [facility] hospice patient with a terminal diagnosis of Cerebral Atherosclerosis.  Patient became unresponsive during her bath on 08/11/21 and family activated EMS. Patient was transported to ED and  admitted with diagnosis of pneumonia and stroke on 08/11/21. Per [facility] physician, Dr Orpah Melter this is a related hospital admission.    Visited at bedside and exchanged report with bedside RN. Patient was resting comfortably and was able to answer to my voice. She did state she was not in any pain. Plan is to d/c home today. This RN has updated team.     Patient continues to remain inpatient appropriate for IV antibiotics and IV fluids.    V/S: 98.1, 63, 68, 16, 139/55, 92%   I/O: none documented today   Abnormal Labs   Latest Reference Range & Units 08/15/21 01:58  COMPREHENSIVE METABOLIC PANEL  Rpt !  Sodium 135 - 145 mmol/L 143  Potassium 3.5 - 5.1 mmol/L 4.2  Chloride 98 - 111 mmol/L 114 (H)  CO2 22 - 32 mmol/L 18 (L)  Glucose 70 - 99 mg/dL 164 (H)  BUN 8 - 23 mg/dL 11  Creatinine 0.44 - 1.00 mg/dL 1.12 (H)  Calcium 8.9 - 10.3 mg/dL 8.6 (L)  Anion gap 5 - 15  11  Magnesium 1.7 - 2.4 mg/dL 1.7  Alkaline Phosphatase 38 - 126 U/L 62  Albumin 3.5 - 5.0 g/dL 2.9 (L)  AST 15 - 41 U/L 17  ALT 0 - 44 U/L 20  Total Protein 6.5 - 8.1 g/dL 5.5 (L)  Total Bilirubin 0.3 - 1.2 mg/dL 0.8  GFR, Estimated >60 mL/min 50 (L)  B Natriuretic Peptide 0.0 - 100.0 pg/mL 82.4  WBC 4.0 - 10.5 K/uL 9.4  RBC 3.87 - 5.11 MIL/uL 4.65  Hemoglobin 12.0 - 15.0 g/dL 13.4  HCT 36.0 - 46.0 % 40.1  MCV 80.0 - 100.0 fL 86.2  MCH 26.0 - 34.0 pg 28.8  MCHC 30.0 - 36.0 g/dL 33.4  RDW 11.5 - 15.5 % 13.7  Platelets 150 - 400 K/uL 99 (L)  nRBC 0.0 - 0.2 % 0.0  Neutrophils % 67  Lymphocytes % 18  Monocytes Relative % 9  Eosinophil % 5  Basophil % 0  Immature Granulocytes % 1  NEUT# 1.7 - 7.7 K/uL 6.3  Lymphocyte # 0.7 - 4.0 K/uL 1.7   Monocyte # 0.1 - 1.0 K/uL 0.8  Eosinophils Absolute 0.0 - 0.5 K/uL 0.5  Basophils Absolute 0.0 - 0.1 K/uL 0.0  Abs Immature Granulocytes 0.00 - 0.07 K/uL 0.09 (H)    Diagnostics Abdomen X Ray FINDINGS: There is persistent abnormal gaseous distension of the large bowel loops as noted on previous imaging. Compared with the previous exam there is been increase caliber of previously characterized redundant and tortuous distal transverse colon which now measures 10.7 cm versus 8.7 cm on the previous exam. Retained stool is again noted in the rectum.   IMPRESSION: 1. Persistent abnormal gaseous distension of the large bowel loops. Etiology remains indeterminate with differential considerations including distal large bowel obstruction versus colonic ileus. 2. Increase in caliber of previously characterized redundant and tortuous distal transverse colon.   IV/PRN Medications:  Ampicillin-Sulbactam (UNASYN) 3 g in sodium chloride 0.9 % 100 mL IVPB 0900 lactated ringers infusion Rate: 100 mL/hr Freq: Continuous Route: IV    Problem:  1. Acute left hippocampus stroke along with severe dehydration, hypotension causing metabolic encephalopathy - of note patient  is home hospice and appears terminal, goal of care here was comfort and nonaggressive measures according to the chart review.  She has been seen by neurology, no further work-up will be done as the outcome would not change.  Continue aspirin only, will add niacin, note patient has severe statin allergy.  EEG also noted which is nonacute.   2.  Previous history of stroke with left-sided hemiparesis, advanced vascular dementia.  She has been enrolled into home hospice since middle of 2022 with goal being of comfort measures.  Have discussed her case with her hospice team, also had detailed discussion with patient's power of attorney her niece on 08/12/2021 continue gentle medical treatment avoid heroics and unnecessary testing, discharge  with home hospice again.     3.  Severe dehydration, hypotension, hypokalemia and hypernatremia.  Placed on D5W, replace potassium and monitor.  Clinically improving.   4.  Ileus noted on CT scan on admission.  Mild pain no Nausea, abdominal exam soft, also suspicion of large stool burden in the rectum, better after bowel disimpaction and SMOG enema, 3 BMs since then, have improved she has no abdominal discomfort, bowel regimen adjusted further will repeat KUB on 08/14/2021.   5.  Dyslipidemia.  Statin allergy placed on niacin.   6.  Hypothyroidism.  Continue home dose Synthroid.   7.  OSA.  Wears nighttime CPAP.   8.  ? Asp PNA on CT - SLP, Unasyn, monitor.  Clinically much improved.   9.  Right arm infiltration of IV.  Elevate the right arm.     10. DM Type II with DKA upon admission.  DKA has resolved, insulin dose adjusted further on 08/14/2021 she is running hypoglycemic on much lower than home dose insulin, question compliance at home with insulin.  A1c suggests poor outpatient glycemic control.   Discharge planning: return home with hospice today    Family Contact: spoke with niece [name]    IDT: team updated    Goals of care: DNR. Reviewed goals of care again today with niece. Treat infection but no aggressive therapies or surgeries.    Please used [facility] for ambulance transport as this services is contracted for all [facility] active hospice patients.   Please call with questions or concerns.   Thank you,  Clementeen Hoof, BSN, RN [facility] Liaison  [phone]

## 2021-08-15 NOTE — Discharge Instructions (Signed)
Disposition.  Residential hospice Condition.  Guarded CODE STATUS.  DNR Activity.  With assistance as tolerated, full fall precautions. Diet.  Soft with feeding assistance and aspiration precautions. Goal of care.  Comfort.  Accuchecks 4 times/day, Once in AM empty stomach and then before each meal. Log in all results and show them to your Prim.MD in 3 days. If any glucose reading is under 80 or above 300 call your Prim MD immidiately. Follow Low glucose instructions for glucose under 80 as instructed.

## 2021-08-16 DIAGNOSIS — I672 Cerebral atherosclerosis: Secondary | ICD-10-CM | POA: Diagnosis not present

## 2021-08-16 DIAGNOSIS — I69354 Hemiplegia and hemiparesis following cerebral infarction affecting left non-dominant side: Secondary | ICD-10-CM | POA: Diagnosis not present

## 2021-08-16 DIAGNOSIS — I129 Hypertensive chronic kidney disease with stage 1 through stage 4 chronic kidney disease, or unspecified chronic kidney disease: Secondary | ICD-10-CM | POA: Diagnosis not present

## 2021-08-16 DIAGNOSIS — E1122 Type 2 diabetes mellitus with diabetic chronic kidney disease: Secondary | ICD-10-CM | POA: Diagnosis not present

## 2021-08-16 DIAGNOSIS — N183 Chronic kidney disease, stage 3 unspecified: Secondary | ICD-10-CM | POA: Diagnosis not present

## 2021-08-16 DIAGNOSIS — I2510 Atherosclerotic heart disease of native coronary artery without angina pectoris: Secondary | ICD-10-CM | POA: Diagnosis not present

## 2021-08-20 DIAGNOSIS — E1122 Type 2 diabetes mellitus with diabetic chronic kidney disease: Secondary | ICD-10-CM | POA: Diagnosis not present

## 2021-08-20 DIAGNOSIS — I69354 Hemiplegia and hemiparesis following cerebral infarction affecting left non-dominant side: Secondary | ICD-10-CM | POA: Diagnosis not present

## 2021-08-20 DIAGNOSIS — I2510 Atherosclerotic heart disease of native coronary artery without angina pectoris: Secondary | ICD-10-CM | POA: Diagnosis not present

## 2021-08-20 DIAGNOSIS — I672 Cerebral atherosclerosis: Secondary | ICD-10-CM | POA: Diagnosis not present

## 2021-08-20 DIAGNOSIS — N183 Chronic kidney disease, stage 3 unspecified: Secondary | ICD-10-CM | POA: Diagnosis not present

## 2021-08-20 DIAGNOSIS — I129 Hypertensive chronic kidney disease with stage 1 through stage 4 chronic kidney disease, or unspecified chronic kidney disease: Secondary | ICD-10-CM | POA: Diagnosis not present

## 2021-08-22 DIAGNOSIS — N183 Chronic kidney disease, stage 3 unspecified: Secondary | ICD-10-CM | POA: Diagnosis not present

## 2021-08-22 DIAGNOSIS — I69354 Hemiplegia and hemiparesis following cerebral infarction affecting left non-dominant side: Secondary | ICD-10-CM | POA: Diagnosis not present

## 2021-08-22 DIAGNOSIS — I129 Hypertensive chronic kidney disease with stage 1 through stage 4 chronic kidney disease, or unspecified chronic kidney disease: Secondary | ICD-10-CM | POA: Diagnosis not present

## 2021-08-22 DIAGNOSIS — E1122 Type 2 diabetes mellitus with diabetic chronic kidney disease: Secondary | ICD-10-CM | POA: Diagnosis not present

## 2021-08-22 DIAGNOSIS — I2510 Atherosclerotic heart disease of native coronary artery without angina pectoris: Secondary | ICD-10-CM | POA: Diagnosis not present

## 2021-08-22 DIAGNOSIS — I672 Cerebral atherosclerosis: Secondary | ICD-10-CM | POA: Diagnosis not present

## 2021-08-23 DIAGNOSIS — N183 Chronic kidney disease, stage 3 unspecified: Secondary | ICD-10-CM | POA: Diagnosis not present

## 2021-08-23 DIAGNOSIS — I129 Hypertensive chronic kidney disease with stage 1 through stage 4 chronic kidney disease, or unspecified chronic kidney disease: Secondary | ICD-10-CM | POA: Diagnosis not present

## 2021-08-23 DIAGNOSIS — I672 Cerebral atherosclerosis: Secondary | ICD-10-CM | POA: Diagnosis not present

## 2021-08-23 DIAGNOSIS — I69354 Hemiplegia and hemiparesis following cerebral infarction affecting left non-dominant side: Secondary | ICD-10-CM | POA: Diagnosis not present

## 2021-08-23 DIAGNOSIS — E1122 Type 2 diabetes mellitus with diabetic chronic kidney disease: Secondary | ICD-10-CM | POA: Diagnosis not present

## 2021-08-23 DIAGNOSIS — I2510 Atherosclerotic heart disease of native coronary artery without angina pectoris: Secondary | ICD-10-CM | POA: Diagnosis not present

## 2021-08-25 DIAGNOSIS — I129 Hypertensive chronic kidney disease with stage 1 through stage 4 chronic kidney disease, or unspecified chronic kidney disease: Secondary | ICD-10-CM | POA: Diagnosis not present

## 2021-08-25 DIAGNOSIS — E1122 Type 2 diabetes mellitus with diabetic chronic kidney disease: Secondary | ICD-10-CM | POA: Diagnosis not present

## 2021-08-25 DIAGNOSIS — I2510 Atherosclerotic heart disease of native coronary artery without angina pectoris: Secondary | ICD-10-CM | POA: Diagnosis not present

## 2021-08-25 DIAGNOSIS — I672 Cerebral atherosclerosis: Secondary | ICD-10-CM | POA: Diagnosis not present

## 2021-08-25 DIAGNOSIS — N183 Chronic kidney disease, stage 3 unspecified: Secondary | ICD-10-CM | POA: Diagnosis not present

## 2021-08-25 DIAGNOSIS — I69354 Hemiplegia and hemiparesis following cerebral infarction affecting left non-dominant side: Secondary | ICD-10-CM | POA: Diagnosis not present

## 2021-08-27 DIAGNOSIS — I672 Cerebral atherosclerosis: Secondary | ICD-10-CM | POA: Diagnosis not present

## 2021-08-27 DIAGNOSIS — N183 Chronic kidney disease, stage 3 unspecified: Secondary | ICD-10-CM | POA: Diagnosis not present

## 2021-08-27 DIAGNOSIS — I129 Hypertensive chronic kidney disease with stage 1 through stage 4 chronic kidney disease, or unspecified chronic kidney disease: Secondary | ICD-10-CM | POA: Diagnosis not present

## 2021-08-27 DIAGNOSIS — E1122 Type 2 diabetes mellitus with diabetic chronic kidney disease: Secondary | ICD-10-CM | POA: Diagnosis not present

## 2021-08-27 DIAGNOSIS — I2510 Atherosclerotic heart disease of native coronary artery without angina pectoris: Secondary | ICD-10-CM | POA: Diagnosis not present

## 2021-08-27 DIAGNOSIS — I69354 Hemiplegia and hemiparesis following cerebral infarction affecting left non-dominant side: Secondary | ICD-10-CM | POA: Diagnosis not present

## 2021-08-28 DIAGNOSIS — E039 Hypothyroidism, unspecified: Secondary | ICD-10-CM | POA: Diagnosis not present

## 2021-08-28 DIAGNOSIS — I69354 Hemiplegia and hemiparesis following cerebral infarction affecting left non-dominant side: Secondary | ICD-10-CM | POA: Diagnosis not present

## 2021-08-28 DIAGNOSIS — K219 Gastro-esophageal reflux disease without esophagitis: Secondary | ICD-10-CM | POA: Diagnosis not present

## 2021-08-28 DIAGNOSIS — E559 Vitamin D deficiency, unspecified: Secondary | ICD-10-CM | POA: Diagnosis not present

## 2021-08-28 DIAGNOSIS — G44009 Cluster headache syndrome, unspecified, not intractable: Secondary | ICD-10-CM | POA: Diagnosis not present

## 2021-08-28 DIAGNOSIS — N183 Chronic kidney disease, stage 3 unspecified: Secondary | ICD-10-CM | POA: Diagnosis not present

## 2021-08-28 DIAGNOSIS — I2510 Atherosclerotic heart disease of native coronary artery without angina pectoris: Secondary | ICD-10-CM | POA: Diagnosis not present

## 2021-08-28 DIAGNOSIS — Z853 Personal history of malignant neoplasm of breast: Secondary | ICD-10-CM | POA: Diagnosis not present

## 2021-08-28 DIAGNOSIS — E1122 Type 2 diabetes mellitus with diabetic chronic kidney disease: Secondary | ICD-10-CM | POA: Diagnosis not present

## 2021-08-28 DIAGNOSIS — I129 Hypertensive chronic kidney disease with stage 1 through stage 4 chronic kidney disease, or unspecified chronic kidney disease: Secondary | ICD-10-CM | POA: Diagnosis not present

## 2021-08-28 DIAGNOSIS — I672 Cerebral atherosclerosis: Secondary | ICD-10-CM | POA: Diagnosis not present

## 2021-08-28 DIAGNOSIS — G4733 Obstructive sleep apnea (adult) (pediatric): Secondary | ICD-10-CM | POA: Diagnosis not present

## 2021-08-28 DIAGNOSIS — F32A Depression, unspecified: Secondary | ICD-10-CM | POA: Diagnosis not present

## 2021-08-28 DIAGNOSIS — E785 Hyperlipidemia, unspecified: Secondary | ICD-10-CM | POA: Diagnosis not present

## 2021-09-03 ENCOUNTER — Telehealth: Payer: Self-pay | Admitting: Family Medicine

## 2021-09-03 NOTE — Telephone Encounter (Signed)
Pt passed away 09/10/2021. ?Spoke with nephew Mr Julia Banks, expressed my condolences. ?Death certificate filled out.  ?

## 2021-09-28 DEATH — deceased

## 2021-10-20 ENCOUNTER — Other Ambulatory Visit: Payer: Self-pay | Admitting: Family Medicine
